# Patient Record
Sex: Female | Born: 1935 | Race: White | Hispanic: No | Smoking: Former smoker
Health system: Southern US, Community
[De-identification: ages and names within clinical notes are randomized; demographics above are authoritative.]

## PROBLEM LIST (undated history)

## (undated) DIAGNOSIS — K219 Gastro-esophageal reflux disease without esophagitis: Secondary | ICD-10-CM

## (undated) DIAGNOSIS — M8580 Other specified disorders of bone density and structure, unspecified site: Secondary | ICD-10-CM

## (undated) DIAGNOSIS — J449 Chronic obstructive pulmonary disease, unspecified: Secondary | ICD-10-CM

## (undated) DIAGNOSIS — F329 Major depressive disorder, single episode, unspecified: Secondary | ICD-10-CM

## (undated) DIAGNOSIS — D126 Benign neoplasm of colon, unspecified: Secondary | ICD-10-CM

## (undated) DIAGNOSIS — E039 Hypothyroidism, unspecified: Secondary | ICD-10-CM

## (undated) DIAGNOSIS — F419 Anxiety disorder, unspecified: Secondary | ICD-10-CM

## (undated) DIAGNOSIS — O2230 Deep phlebothrombosis in pregnancy, unspecified trimester: Secondary | ICD-10-CM

## (undated) DIAGNOSIS — E785 Hyperlipidemia, unspecified: Secondary | ICD-10-CM

## (undated) DIAGNOSIS — F32A Depression, unspecified: Secondary | ICD-10-CM

## (undated) DIAGNOSIS — M1990 Unspecified osteoarthritis, unspecified site: Secondary | ICD-10-CM

## (undated) DIAGNOSIS — Z9981 Dependence on supplemental oxygen: Secondary | ICD-10-CM

## (undated) DIAGNOSIS — I10 Essential (primary) hypertension: Secondary | ICD-10-CM

## (undated) DIAGNOSIS — K449 Diaphragmatic hernia without obstruction or gangrene: Secondary | ICD-10-CM

## (undated) DIAGNOSIS — I639 Cerebral infarction, unspecified: Secondary | ICD-10-CM

## (undated) DIAGNOSIS — I2699 Other pulmonary embolism without acute cor pulmonale: Secondary | ICD-10-CM

## (undated) DIAGNOSIS — K5790 Diverticulosis of intestine, part unspecified, without perforation or abscess without bleeding: Secondary | ICD-10-CM

## (undated) HISTORY — PX: APPENDECTOMY: SHX54

## (undated) HISTORY — DX: Hypothyroidism, unspecified: E03.9

## (undated) HISTORY — PX: CATARACT EXTRACTION: SUR2

## (undated) HISTORY — DX: Anxiety disorder, unspecified: F41.9

## (undated) HISTORY — DX: Essential (primary) hypertension: I10

## (undated) HISTORY — DX: Chronic obstructive pulmonary disease, unspecified: J44.9

## (undated) HISTORY — PX: LUMBAR FUSION: SHX111

## (undated) HISTORY — PX: CHOLECYSTECTOMY: SHX55

## (undated) HISTORY — DX: Other specified disorders of bone density and structure, unspecified site: M85.80

## (undated) HISTORY — PX: TUBAL LIGATION: SHX77

## (undated) HISTORY — DX: Diverticulosis of intestine, part unspecified, without perforation or abscess without bleeding: K57.90

## (undated) HISTORY — DX: Diaphragmatic hernia without obstruction or gangrene: K44.9

## (undated) HISTORY — PX: TONSILLECTOMY: SUR1361

## (undated) HISTORY — DX: Gastro-esophageal reflux disease without esophagitis: K21.9

## (undated) HISTORY — DX: Hyperlipidemia, unspecified: E78.5

## (undated) HISTORY — DX: Depression, unspecified: F32.A

## (undated) HISTORY — DX: Unspecified osteoarthritis, unspecified site: M19.90

## (undated) HISTORY — DX: Benign neoplasm of colon, unspecified: D12.6

## (undated) HISTORY — DX: Deep phlebothrombosis in pregnancy, unspecified trimester: O22.30

## (undated) HISTORY — DX: Major depressive disorder, single episode, unspecified: F32.9

## (undated) HISTORY — PX: POLYPECTOMY: SHX149

## (undated) HISTORY — DX: Other pulmonary embolism without acute cor pulmonale: I26.99

## (undated) HISTORY — DX: Cerebral infarction, unspecified: I63.9

---

## 2004-07-11 ENCOUNTER — Ambulatory Visit: Payer: Self-pay | Admitting: Internal Medicine

## 2004-07-11 ENCOUNTER — Inpatient Hospital Stay (HOSPITAL_COMMUNITY): Admission: EM | Admit: 2004-07-11 | Discharge: 2004-07-14 | Payer: Self-pay | Admitting: Emergency Medicine

## 2004-07-12 ENCOUNTER — Ambulatory Visit: Payer: Self-pay | Admitting: Internal Medicine

## 2004-07-16 ENCOUNTER — Ambulatory Visit: Payer: Self-pay | Admitting: Internal Medicine

## 2004-07-25 ENCOUNTER — Ambulatory Visit: Payer: Self-pay | Admitting: Internal Medicine

## 2004-08-08 ENCOUNTER — Ambulatory Visit: Payer: Self-pay | Admitting: Internal Medicine

## 2004-08-26 ENCOUNTER — Ambulatory Visit: Payer: Self-pay | Admitting: Internal Medicine

## 2004-09-03 ENCOUNTER — Ambulatory Visit: Payer: Self-pay

## 2004-09-26 ENCOUNTER — Ambulatory Visit: Payer: Self-pay | Admitting: Internal Medicine

## 2004-10-24 ENCOUNTER — Ambulatory Visit: Payer: Self-pay | Admitting: Internal Medicine

## 2004-10-24 ENCOUNTER — Other Ambulatory Visit: Admission: RE | Admit: 2004-10-24 | Discharge: 2004-10-24 | Payer: Self-pay | Admitting: Internal Medicine

## 2004-11-02 LAB — CONVERTED CEMR LAB: Pap Smear: NORMAL

## 2004-11-13 ENCOUNTER — Encounter: Admission: RE | Admit: 2004-11-13 | Discharge: 2004-11-13 | Payer: Self-pay | Admitting: Internal Medicine

## 2004-12-01 ENCOUNTER — Ambulatory Visit: Payer: Self-pay | Admitting: Internal Medicine

## 2004-12-23 ENCOUNTER — Ambulatory Visit: Payer: Self-pay | Admitting: Internal Medicine

## 2004-12-29 ENCOUNTER — Ambulatory Visit: Payer: Self-pay | Admitting: Internal Medicine

## 2005-01-06 ENCOUNTER — Ambulatory Visit: Payer: Self-pay | Admitting: Family Medicine

## 2005-01-13 ENCOUNTER — Ambulatory Visit: Payer: Self-pay | Admitting: Internal Medicine

## 2005-01-26 ENCOUNTER — Ambulatory Visit: Payer: Self-pay | Admitting: Internal Medicine

## 2005-02-16 ENCOUNTER — Ambulatory Visit: Payer: Self-pay | Admitting: Internal Medicine

## 2005-03-30 ENCOUNTER — Ambulatory Visit: Payer: Self-pay | Admitting: Internal Medicine

## 2005-04-23 ENCOUNTER — Ambulatory Visit: Payer: Self-pay | Admitting: Internal Medicine

## 2005-05-08 ENCOUNTER — Ambulatory Visit: Payer: Self-pay | Admitting: Internal Medicine

## 2005-05-18 ENCOUNTER — Ambulatory Visit: Payer: Self-pay | Admitting: Internal Medicine

## 2005-05-29 ENCOUNTER — Ambulatory Visit: Payer: Self-pay | Admitting: Internal Medicine

## 2005-06-05 ENCOUNTER — Ambulatory Visit: Payer: Self-pay | Admitting: Gastroenterology

## 2005-06-12 ENCOUNTER — Ambulatory Visit: Payer: Self-pay | Admitting: Internal Medicine

## 2005-06-26 ENCOUNTER — Ambulatory Visit: Payer: Self-pay | Admitting: Family Medicine

## 2005-07-08 ENCOUNTER — Encounter: Payer: Self-pay | Admitting: Internal Medicine

## 2005-07-08 ENCOUNTER — Encounter (INDEPENDENT_AMBULATORY_CARE_PROVIDER_SITE_OTHER): Payer: Self-pay | Admitting: Specialist

## 2005-07-08 ENCOUNTER — Ambulatory Visit: Payer: Self-pay | Admitting: Gastroenterology

## 2005-07-08 DIAGNOSIS — D126 Benign neoplasm of colon, unspecified: Secondary | ICD-10-CM

## 2005-07-08 HISTORY — DX: Benign neoplasm of colon, unspecified: D12.6

## 2005-07-08 LAB — HM COLONOSCOPY

## 2005-07-10 ENCOUNTER — Ambulatory Visit: Payer: Self-pay | Admitting: Family Medicine

## 2005-07-24 ENCOUNTER — Ambulatory Visit: Payer: Self-pay | Admitting: Internal Medicine

## 2005-07-31 ENCOUNTER — Ambulatory Visit: Payer: Self-pay | Admitting: Internal Medicine

## 2005-08-14 ENCOUNTER — Ambulatory Visit: Payer: Self-pay | Admitting: Internal Medicine

## 2005-08-19 ENCOUNTER — Ambulatory Visit: Payer: Self-pay | Admitting: Internal Medicine

## 2005-08-27 ENCOUNTER — Ambulatory Visit: Payer: Self-pay | Admitting: Internal Medicine

## 2005-09-02 ENCOUNTER — Ambulatory Visit: Payer: Self-pay | Admitting: Internal Medicine

## 2005-09-11 ENCOUNTER — Ambulatory Visit: Payer: Self-pay | Admitting: Internal Medicine

## 2005-09-17 ENCOUNTER — Ambulatory Visit: Payer: Self-pay | Admitting: Internal Medicine

## 2005-10-02 ENCOUNTER — Ambulatory Visit: Payer: Self-pay | Admitting: Internal Medicine

## 2005-10-09 ENCOUNTER — Ambulatory Visit: Payer: Self-pay | Admitting: Internal Medicine

## 2005-10-23 ENCOUNTER — Ambulatory Visit: Payer: Self-pay | Admitting: Internal Medicine

## 2005-11-20 ENCOUNTER — Ambulatory Visit: Payer: Self-pay | Admitting: Internal Medicine

## 2005-12-08 ENCOUNTER — Ambulatory Visit: Payer: Self-pay | Admitting: Internal Medicine

## 2005-12-21 ENCOUNTER — Ambulatory Visit: Payer: Self-pay | Admitting: Internal Medicine

## 2005-12-25 ENCOUNTER — Ambulatory Visit: Payer: Self-pay | Admitting: Internal Medicine

## 2006-01-08 ENCOUNTER — Ambulatory Visit: Payer: Self-pay | Admitting: Internal Medicine

## 2006-02-02 ENCOUNTER — Ambulatory Visit: Payer: Self-pay | Admitting: Internal Medicine

## 2006-03-05 ENCOUNTER — Ambulatory Visit: Payer: Self-pay | Admitting: Internal Medicine

## 2006-03-18 ENCOUNTER — Ambulatory Visit: Payer: Self-pay | Admitting: Internal Medicine

## 2006-03-30 ENCOUNTER — Emergency Department (HOSPITAL_COMMUNITY): Admission: EM | Admit: 2006-03-30 | Discharge: 2006-03-30 | Payer: Self-pay | Admitting: Emergency Medicine

## 2006-04-02 ENCOUNTER — Ambulatory Visit: Payer: Self-pay | Admitting: Internal Medicine

## 2006-05-11 ENCOUNTER — Ambulatory Visit: Payer: Self-pay | Admitting: Internal Medicine

## 2006-06-08 ENCOUNTER — Ambulatory Visit: Payer: Self-pay | Admitting: Internal Medicine

## 2006-06-15 ENCOUNTER — Ambulatory Visit: Payer: Self-pay | Admitting: Internal Medicine

## 2006-06-21 ENCOUNTER — Ambulatory Visit: Payer: Self-pay | Admitting: Internal Medicine

## 2006-06-28 ENCOUNTER — Ambulatory Visit: Payer: Self-pay | Admitting: Internal Medicine

## 2006-07-13 ENCOUNTER — Ambulatory Visit: Payer: Self-pay | Admitting: Internal Medicine

## 2006-07-23 ENCOUNTER — Ambulatory Visit: Payer: Self-pay | Admitting: Internal Medicine

## 2006-08-18 ENCOUNTER — Ambulatory Visit: Payer: Self-pay | Admitting: Internal Medicine

## 2006-09-11 DIAGNOSIS — J449 Chronic obstructive pulmonary disease, unspecified: Secondary | ICD-10-CM | POA: Insufficient documentation

## 2006-09-11 DIAGNOSIS — F32A Depression, unspecified: Secondary | ICD-10-CM | POA: Insufficient documentation

## 2006-09-11 DIAGNOSIS — I10 Essential (primary) hypertension: Secondary | ICD-10-CM | POA: Insufficient documentation

## 2006-09-11 DIAGNOSIS — F329 Major depressive disorder, single episode, unspecified: Secondary | ICD-10-CM | POA: Insufficient documentation

## 2006-09-11 DIAGNOSIS — K219 Gastro-esophageal reflux disease without esophagitis: Secondary | ICD-10-CM | POA: Insufficient documentation

## 2006-09-11 DIAGNOSIS — J45909 Unspecified asthma, uncomplicated: Secondary | ICD-10-CM | POA: Insufficient documentation

## 2006-09-11 DIAGNOSIS — Z95818 Presence of other cardiac implants and grafts: Secondary | ICD-10-CM | POA: Insufficient documentation

## 2006-09-11 DIAGNOSIS — N390 Urinary tract infection, site not specified: Secondary | ICD-10-CM | POA: Insufficient documentation

## 2006-09-20 ENCOUNTER — Ambulatory Visit: Payer: Self-pay | Admitting: Internal Medicine

## 2006-09-20 LAB — CONVERTED CEMR LAB
BUN: 19 mg/dL (ref 6–23)
Creatinine, Ser: 1.3 mg/dL — ABNORMAL HIGH (ref 0.4–1.2)
HCT: 41.1 % (ref 36.0–46.0)
Hemoglobin: 13.8 g/dL (ref 12.0–15.0)
Hgb A1c MFr Bld: 6 % (ref 4.6–6.0)
Homocysteine: 13.2 micromoles/L (ref 5.00–13.90)
MCHC: 33.6 g/dL (ref 30.0–36.0)
MCV: 92.4 fL (ref 78.0–100.0)
Platelets: 200 10*3/uL (ref 150–400)
Potassium: 4.7 meq/L (ref 3.5–5.1)
RBC: 4.45 M/uL (ref 3.87–5.11)
RDW: 13.9 % (ref 11.5–14.6)
WBC: 7 10*3/uL (ref 4.5–10.5)

## 2006-09-21 ENCOUNTER — Encounter: Payer: Self-pay | Admitting: Internal Medicine

## 2006-09-21 LAB — CONVERTED CEMR LAB
Bilirubin Urine: NEGATIVE
Ketones, ur: NEGATIVE mg/dL
Leukocytes, UA: NEGATIVE
Nitrite: NEGATIVE
Protein, ur: NEGATIVE mg/dL
Specific Gravity, Urine: 1.018 (ref 1.005–1.03)
Urine Glucose: NEGATIVE mg/dL
Urobilinogen, UA: 0.2 (ref 0.0–1.0)
WBC, UA: NONE SEEN cells/hpf (ref <3)
pH: 6 (ref 5.0–8.0)

## 2006-09-22 ENCOUNTER — Encounter: Payer: Self-pay | Admitting: Internal Medicine

## 2006-09-29 ENCOUNTER — Ambulatory Visit: Payer: Self-pay | Admitting: Internal Medicine

## 2006-10-20 ENCOUNTER — Ambulatory Visit: Payer: Self-pay | Admitting: Internal Medicine

## 2006-11-17 ENCOUNTER — Ambulatory Visit: Payer: Self-pay | Admitting: Internal Medicine

## 2006-12-15 ENCOUNTER — Ambulatory Visit: Payer: Self-pay | Admitting: Internal Medicine

## 2006-12-15 LAB — CONVERTED CEMR LAB
INR: 1.7
Prothrombin Time: 16.1 s

## 2006-12-29 ENCOUNTER — Ambulatory Visit: Payer: Self-pay | Admitting: Internal Medicine

## 2007-01-10 ENCOUNTER — Ambulatory Visit: Payer: Self-pay | Admitting: Internal Medicine

## 2007-01-10 LAB — CONVERTED CEMR LAB
INR: 3.6
Prothrombin Time: 23 s

## 2007-01-24 ENCOUNTER — Ambulatory Visit: Payer: Self-pay | Admitting: Internal Medicine

## 2007-01-27 LAB — CONVERTED CEMR LAB
INR: 1.8 (ref 0.9–2.0)
Prothrombin Time: 16.8 s — ABNORMAL HIGH (ref 10.0–14.0)

## 2007-02-07 ENCOUNTER — Ambulatory Visit: Payer: Self-pay | Admitting: Internal Medicine

## 2007-02-07 LAB — CONVERTED CEMR LAB: INR: 2.3

## 2007-03-09 ENCOUNTER — Ambulatory Visit: Payer: Self-pay | Admitting: Internal Medicine

## 2007-03-09 LAB — CONVERTED CEMR LAB: INR: 3.3

## 2007-03-22 ENCOUNTER — Encounter: Payer: Self-pay | Admitting: Internal Medicine

## 2007-03-24 ENCOUNTER — Ambulatory Visit: Payer: Self-pay | Admitting: Internal Medicine

## 2007-03-24 ENCOUNTER — Encounter: Payer: Self-pay | Admitting: Cardiology

## 2007-03-24 LAB — CONVERTED CEMR LAB: INR: 2.6

## 2007-03-28 ENCOUNTER — Encounter (INDEPENDENT_AMBULATORY_CARE_PROVIDER_SITE_OTHER): Payer: Self-pay | Admitting: *Deleted

## 2007-03-29 LAB — CONVERTED CEMR LAB
BUN: 26 mg/dL — ABNORMAL HIGH (ref 6–23)
Basophils Absolute: 0.1 10*3/uL (ref 0.0–0.1)
Basophils Relative: 1 % (ref 0.0–1.0)
CO2: 32 meq/L (ref 19–32)
Calcium: 9 mg/dL (ref 8.4–10.5)
Chloride: 107 meq/L (ref 96–112)
Creatinine, Ser: 1.3 mg/dL — ABNORMAL HIGH (ref 0.4–1.2)
Eosinophils Absolute: 0.3 10*3/uL (ref 0.0–0.6)
Eosinophils Relative: 4.7 % (ref 0.0–5.0)
GFR calc Af Amer: 52 mL/min
GFR calc non Af Amer: 43 mL/min
Glucose, Bld: 96 mg/dL (ref 70–99)
HCT: 34.8 % — ABNORMAL LOW (ref 36.0–46.0)
Hemoglobin: 12 g/dL (ref 12.0–15.0)
Hgb A1c MFr Bld: 6.2 % — ABNORMAL HIGH (ref 4.6–6.0)
Lymphocytes Relative: 23 % (ref 12.0–46.0)
MCHC: 34.5 g/dL (ref 30.0–36.0)
MCV: 87.9 fL (ref 78.0–100.0)
Monocytes Absolute: 0.5 10*3/uL (ref 0.2–0.7)
Monocytes Relative: 6.4 % (ref 3.0–11.0)
Neutro Abs: 4.6 10*3/uL (ref 1.4–7.7)
Neutrophils Relative %: 64.9 % (ref 43.0–77.0)
Platelets: 237 10*3/uL (ref 150–400)
Potassium: 4.5 meq/L (ref 3.5–5.1)
RBC: 3.96 M/uL (ref 3.87–5.11)
RDW: 14.6 % (ref 11.5–14.6)
Sodium: 145 meq/L (ref 135–145)
WBC: 7.1 10*3/uL (ref 4.5–10.5)

## 2007-04-08 ENCOUNTER — Ambulatory Visit: Payer: Self-pay | Admitting: Internal Medicine

## 2007-04-08 LAB — CONVERTED CEMR LAB
Hemoglobin: 12.9 g/dL
INR: 2.5

## 2007-04-13 ENCOUNTER — Ambulatory Visit: Payer: Self-pay | Admitting: Cardiology

## 2007-04-25 ENCOUNTER — Ambulatory Visit: Payer: Self-pay

## 2007-04-25 ENCOUNTER — Encounter: Payer: Self-pay | Admitting: Cardiology

## 2007-04-25 ENCOUNTER — Encounter: Payer: Self-pay | Admitting: Internal Medicine

## 2007-05-05 ENCOUNTER — Ambulatory Visit: Payer: Self-pay | Admitting: Internal Medicine

## 2007-05-05 LAB — CONVERTED CEMR LAB: INR: 3.3

## 2007-05-13 ENCOUNTER — Ambulatory Visit: Payer: Self-pay | Admitting: Cardiology

## 2007-05-31 ENCOUNTER — Ambulatory Visit: Payer: Self-pay | Admitting: Internal Medicine

## 2007-05-31 LAB — CONVERTED CEMR LAB: INR: 3

## 2007-06-21 ENCOUNTER — Encounter (INDEPENDENT_AMBULATORY_CARE_PROVIDER_SITE_OTHER): Payer: Self-pay | Admitting: *Deleted

## 2007-06-29 ENCOUNTER — Ambulatory Visit: Payer: Self-pay | Admitting: Internal Medicine

## 2007-06-29 LAB — CONVERTED CEMR LAB: INR: 2

## 2007-07-02 ENCOUNTER — Encounter (INDEPENDENT_AMBULATORY_CARE_PROVIDER_SITE_OTHER): Payer: Self-pay | Admitting: *Deleted

## 2007-07-02 LAB — CONVERTED CEMR LAB: TSH: 0.96 microintl units/mL (ref 0.35–5.50)

## 2007-07-22 ENCOUNTER — Ambulatory Visit: Payer: Self-pay | Admitting: Internal Medicine

## 2007-07-22 DIAGNOSIS — M810 Age-related osteoporosis without current pathological fracture: Secondary | ICD-10-CM | POA: Insufficient documentation

## 2007-07-22 LAB — CONVERTED CEMR LAB: INR: 2.5

## 2007-08-16 ENCOUNTER — Ambulatory Visit: Payer: Self-pay | Admitting: Internal Medicine

## 2007-08-16 LAB — CONVERTED CEMR LAB: INR: 2.1

## 2007-09-13 ENCOUNTER — Ambulatory Visit: Payer: Self-pay | Admitting: Internal Medicine

## 2007-09-13 ENCOUNTER — Encounter: Payer: Self-pay | Admitting: Internal Medicine

## 2007-09-13 ENCOUNTER — Other Ambulatory Visit: Admission: RE | Admit: 2007-09-13 | Discharge: 2007-09-13 | Payer: Self-pay | Admitting: Internal Medicine

## 2007-09-13 DIAGNOSIS — E039 Hypothyroidism, unspecified: Secondary | ICD-10-CM | POA: Insufficient documentation

## 2007-09-13 DIAGNOSIS — E785 Hyperlipidemia, unspecified: Secondary | ICD-10-CM | POA: Insufficient documentation

## 2007-09-13 LAB — CONVERTED CEMR LAB
INR: 1.8
Pap Smear: NORMAL

## 2007-09-14 ENCOUNTER — Telehealth (INDEPENDENT_AMBULATORY_CARE_PROVIDER_SITE_OTHER): Payer: Self-pay | Admitting: *Deleted

## 2007-09-15 ENCOUNTER — Telehealth (INDEPENDENT_AMBULATORY_CARE_PROVIDER_SITE_OTHER): Payer: Self-pay | Admitting: *Deleted

## 2007-09-15 DIAGNOSIS — M25559 Pain in unspecified hip: Secondary | ICD-10-CM | POA: Insufficient documentation

## 2007-09-19 ENCOUNTER — Ambulatory Visit: Payer: Self-pay | Admitting: Internal Medicine

## 2007-09-19 LAB — CONVERTED CEMR LAB
ALT: 33 units/L (ref 0–35)
AST: 29 units/L (ref 0–37)
Albumin: 3.6 g/dL (ref 3.5–5.2)
Alkaline Phosphatase: 75 units/L (ref 39–117)
BUN: 20 mg/dL (ref 6–23)
Bilirubin, Direct: 0.1 mg/dL (ref 0.0–0.3)
CO2: 32 meq/L (ref 19–32)
Calcium: 9.4 mg/dL (ref 8.4–10.5)
Chloride: 102 meq/L (ref 96–112)
Cholesterol: 185 mg/dL (ref 0–200)
Creatinine, Ser: 1.2 mg/dL (ref 0.4–1.2)
GFR calc Af Amer: 57 mL/min
GFR calc non Af Amer: 47 mL/min
Glucose, Bld: 91 mg/dL (ref 70–99)
HDL: 55.4 mg/dL (ref >39.0)
Hgb A1c MFr Bld: 5.9 % (ref 4.6–6.0)
LDL Cholesterol: 109 mg/dL — ABNORMAL HIGH (ref 0–99)
Potassium: 4.3 meq/L (ref 3.5–5.1)
Sodium: 140 meq/L (ref 135–145)
Total Bilirubin: 1.2 mg/dL (ref 0.3–1.2)
Total CHOL/HDL Ratio: 3.3
Total Protein: 6.9 g/dL (ref 6.0–8.3)
Triglycerides: 102 mg/dL (ref 0–149)
VLDL: 20 mg/dL (ref 0–40)

## 2007-09-22 ENCOUNTER — Encounter (INDEPENDENT_AMBULATORY_CARE_PROVIDER_SITE_OTHER): Payer: Self-pay | Admitting: *Deleted

## 2007-09-29 ENCOUNTER — Encounter: Admission: RE | Admit: 2007-09-29 | Discharge: 2007-09-29 | Payer: Self-pay | Admitting: Internal Medicine

## 2007-10-05 ENCOUNTER — Ambulatory Visit: Payer: Self-pay | Admitting: Internal Medicine

## 2007-10-05 LAB — CONVERTED CEMR LAB: INR: 3.9

## 2007-10-10 ENCOUNTER — Encounter (INDEPENDENT_AMBULATORY_CARE_PROVIDER_SITE_OTHER): Payer: Self-pay | Admitting: *Deleted

## 2007-10-18 ENCOUNTER — Ambulatory Visit: Payer: Self-pay | Admitting: Internal Medicine

## 2007-10-18 LAB — CONVERTED CEMR LAB: INR: 3.9

## 2007-11-01 ENCOUNTER — Ambulatory Visit: Payer: Self-pay | Admitting: Internal Medicine

## 2007-11-01 LAB — CONVERTED CEMR LAB: INR: 4.4

## 2007-11-22 ENCOUNTER — Ambulatory Visit: Payer: Self-pay | Admitting: Internal Medicine

## 2007-11-22 LAB — CONVERTED CEMR LAB: INR: 2.2

## 2007-12-19 ENCOUNTER — Ambulatory Visit: Payer: Self-pay | Admitting: Internal Medicine

## 2007-12-19 LAB — CONVERTED CEMR LAB: INR: 1.2

## 2008-01-02 ENCOUNTER — Ambulatory Visit: Payer: Self-pay | Admitting: Internal Medicine

## 2008-01-02 LAB — CONVERTED CEMR LAB: INR: 1.9

## 2008-01-10 ENCOUNTER — Ambulatory Visit: Payer: Self-pay | Admitting: Internal Medicine

## 2008-01-10 LAB — CONVERTED CEMR LAB
INR: 1.5
Prothrombin Time: 15.1 s

## 2008-01-23 ENCOUNTER — Ambulatory Visit: Payer: Self-pay | Admitting: Internal Medicine

## 2008-01-23 LAB — CONVERTED CEMR LAB
INR: 5.4
Prothrombin Time: 28.1 s

## 2008-02-13 ENCOUNTER — Telehealth (INDEPENDENT_AMBULATORY_CARE_PROVIDER_SITE_OTHER): Payer: Self-pay | Admitting: *Deleted

## 2008-02-21 ENCOUNTER — Ambulatory Visit: Payer: Self-pay | Admitting: Internal Medicine

## 2008-02-21 LAB — CONVERTED CEMR LAB
INR: 2.3
Prothrombin Time: 18.6 s

## 2008-03-22 ENCOUNTER — Ambulatory Visit: Payer: Self-pay | Admitting: Internal Medicine

## 2008-03-22 LAB — CONVERTED CEMR LAB
INR: 2.1
Prothrombin Time: 17.7 s

## 2008-04-19 ENCOUNTER — Ambulatory Visit: Payer: Self-pay | Admitting: Internal Medicine

## 2008-04-19 LAB — CONVERTED CEMR LAB
INR: 1.5
Prothrombin Time: 15.1 s

## 2008-05-02 ENCOUNTER — Ambulatory Visit: Payer: Self-pay | Admitting: Internal Medicine

## 2008-05-02 LAB — CONVERTED CEMR LAB
INR: 13.8
Prothrombin Time: 1.2 s

## 2008-05-16 ENCOUNTER — Ambulatory Visit: Payer: Self-pay | Admitting: Internal Medicine

## 2008-05-16 DIAGNOSIS — M549 Dorsalgia, unspecified: Secondary | ICD-10-CM | POA: Insufficient documentation

## 2008-05-16 LAB — CONVERTED CEMR LAB
INR: 2.4
Prothrombin Time: 18.8 s

## 2008-05-17 ENCOUNTER — Encounter (INDEPENDENT_AMBULATORY_CARE_PROVIDER_SITE_OTHER): Payer: Self-pay | Admitting: *Deleted

## 2008-05-21 ENCOUNTER — Encounter: Admission: RE | Admit: 2008-05-21 | Discharge: 2008-05-21 | Payer: Self-pay | Admitting: Internal Medicine

## 2008-05-24 ENCOUNTER — Telehealth: Payer: Self-pay | Admitting: Internal Medicine

## 2008-05-30 ENCOUNTER — Ambulatory Visit: Payer: Self-pay | Admitting: Internal Medicine

## 2008-05-30 LAB — CONVERTED CEMR LAB
INR: 2.5
Prothrombin Time: 19.3 s

## 2008-06-04 ENCOUNTER — Encounter: Payer: Self-pay | Admitting: Internal Medicine

## 2008-06-28 ENCOUNTER — Encounter
Admission: RE | Admit: 2008-06-28 | Discharge: 2008-09-26 | Payer: Self-pay | Admitting: Physical Medicine & Rehabilitation

## 2008-06-29 ENCOUNTER — Ambulatory Visit: Payer: Self-pay | Admitting: Physical Medicine & Rehabilitation

## 2008-07-02 ENCOUNTER — Ambulatory Visit: Payer: Self-pay | Admitting: Internal Medicine

## 2008-07-02 LAB — CONVERTED CEMR LAB
INR: 4.3
Prothrombin Time: 25 s

## 2008-07-03 ENCOUNTER — Encounter
Admission: RE | Admit: 2008-07-03 | Discharge: 2008-09-06 | Payer: Self-pay | Admitting: Physical Medicine & Rehabilitation

## 2008-07-11 ENCOUNTER — Ambulatory Visit: Payer: Self-pay | Admitting: Internal Medicine

## 2008-07-11 LAB — CONVERTED CEMR LAB
INR: 0.9
Prothrombin Time: 11.9 s

## 2008-07-12 ENCOUNTER — Ambulatory Visit: Payer: Self-pay | Admitting: Physical Medicine & Rehabilitation

## 2008-07-24 ENCOUNTER — Ambulatory Visit: Payer: Self-pay | Admitting: Internal Medicine

## 2008-07-24 LAB — CONVERTED CEMR LAB
INR: 1.4
Prothrombin Time: 14.6 s

## 2008-08-07 ENCOUNTER — Ambulatory Visit: Payer: Self-pay | Admitting: Internal Medicine

## 2008-08-07 LAB — CONVERTED CEMR LAB
INR: 8
INR: 8.5 (ref 0.8–1.0)
Prothrombin Time: 33.9 s
Prothrombin Time: 81.3 s (ref 10.9–13.3)

## 2008-08-08 ENCOUNTER — Telehealth (INDEPENDENT_AMBULATORY_CARE_PROVIDER_SITE_OTHER): Payer: Self-pay | Admitting: *Deleted

## 2008-08-10 ENCOUNTER — Encounter (INDEPENDENT_AMBULATORY_CARE_PROVIDER_SITE_OTHER): Payer: Self-pay | Admitting: *Deleted

## 2008-08-13 ENCOUNTER — Ambulatory Visit: Payer: Self-pay | Admitting: Physical Medicine & Rehabilitation

## 2008-08-27 ENCOUNTER — Ambulatory Visit: Payer: Self-pay | Admitting: Cardiology

## 2008-09-05 ENCOUNTER — Ambulatory Visit: Payer: Self-pay | Admitting: Internal Medicine

## 2008-09-12 ENCOUNTER — Ambulatory Visit: Payer: Self-pay | Admitting: Cardiology

## 2008-09-12 ENCOUNTER — Telehealth (INDEPENDENT_AMBULATORY_CARE_PROVIDER_SITE_OTHER): Payer: Self-pay | Admitting: *Deleted

## 2008-09-21 ENCOUNTER — Ambulatory Visit: Payer: Self-pay | Admitting: Cardiology

## 2008-10-01 ENCOUNTER — Ambulatory Visit: Payer: Self-pay | Admitting: Cardiology

## 2008-10-29 ENCOUNTER — Ambulatory Visit: Payer: Self-pay | Admitting: Cardiology

## 2008-11-20 ENCOUNTER — Ambulatory Visit: Payer: Self-pay | Admitting: Internal Medicine

## 2008-11-22 LAB — CONVERTED CEMR LAB: TSH: 0.92 microintl units/mL (ref 0.35–5.50)

## 2008-11-26 ENCOUNTER — Ambulatory Visit: Payer: Self-pay | Admitting: Cardiology

## 2008-11-28 ENCOUNTER — Ambulatory Visit: Payer: Self-pay | Admitting: Internal Medicine

## 2008-11-28 DIAGNOSIS — L719 Rosacea, unspecified: Secondary | ICD-10-CM | POA: Insufficient documentation

## 2008-11-30 ENCOUNTER — Encounter: Payer: Self-pay | Admitting: Internal Medicine

## 2008-12-05 LAB — CONVERTED CEMR LAB
ALT: 19 units/L (ref 0–35)
AST: 23 units/L (ref 0–37)
BUN: 22 mg/dL (ref 6–23)
Basophils Absolute: 0.1 10*3/uL (ref 0.0–0.1)
Basophils Relative: 1.4 % (ref 0.0–3.0)
CO2: 28 meq/L (ref 19–32)
Calcium: 9 mg/dL (ref 8.4–10.5)
Chloride: 109 meq/L (ref 96–112)
Creatinine, Ser: 1.2 mg/dL (ref 0.4–1.2)
Eosinophils Absolute: 0.2 10*3/uL (ref 0.0–0.7)
Eosinophils Relative: 2.4 % (ref 0.0–5.0)
GFR calc non Af Amer: 47.11 mL/min (ref >60)
Glucose, Bld: 89 mg/dL (ref 70–99)
HCT: 39.2 % (ref 36.0–46.0)
Hemoglobin: 13.5 g/dL (ref 12.0–15.0)
Lymphocytes Relative: 20.7 % (ref 12.0–46.0)
Lymphs Abs: 1.6 10*3/uL (ref 0.7–4.0)
MCHC: 34.5 g/dL (ref 30.0–36.0)
MCV: 85.6 fL (ref 78.0–100.0)
Monocytes Absolute: 0.6 10*3/uL (ref 0.1–1.0)
Monocytes Relative: 7.2 % (ref 3.0–12.0)
Neutro Abs: 5.4 10*3/uL (ref 1.4–7.7)
Neutrophils Relative %: 68.3 % (ref 43.0–77.0)
Platelets: 367 10*3/uL (ref 150.0–400.0)
Potassium: 4.4 meq/L (ref 3.5–5.1)
RBC: 4.58 M/uL (ref 3.87–5.11)
RDW: 13.2 % (ref 11.5–14.6)
Sodium: 144 meq/L (ref 135–145)
WBC: 7.9 10*3/uL (ref 4.5–10.5)

## 2008-12-06 ENCOUNTER — Encounter (INDEPENDENT_AMBULATORY_CARE_PROVIDER_SITE_OTHER): Payer: Self-pay | Admitting: *Deleted

## 2008-12-24 ENCOUNTER — Ambulatory Visit: Payer: Self-pay | Admitting: Cardiovascular Disease

## 2008-12-28 ENCOUNTER — Ambulatory Visit: Payer: Self-pay | Admitting: Cardiology

## 2009-01-16 ENCOUNTER — Telehealth: Payer: Self-pay | Admitting: Internal Medicine

## 2009-01-18 ENCOUNTER — Ambulatory Visit: Payer: Self-pay | Admitting: Cardiology

## 2009-01-21 ENCOUNTER — Telehealth (INDEPENDENT_AMBULATORY_CARE_PROVIDER_SITE_OTHER): Payer: Self-pay | Admitting: Cardiology

## 2009-01-21 ENCOUNTER — Encounter (INDEPENDENT_AMBULATORY_CARE_PROVIDER_SITE_OTHER): Payer: Self-pay | Admitting: *Deleted

## 2009-02-05 ENCOUNTER — Encounter: Payer: Self-pay | Admitting: *Deleted

## 2009-02-07 ENCOUNTER — Ambulatory Visit: Payer: Self-pay | Admitting: Internal Medicine

## 2009-02-07 LAB — CONVERTED CEMR LAB
POC INR: 2.3
Protime: 18.7

## 2009-02-27 ENCOUNTER — Ambulatory Visit: Payer: Self-pay | Admitting: Internal Medicine

## 2009-03-06 ENCOUNTER — Ambulatory Visit: Payer: Self-pay | Admitting: Internal Medicine

## 2009-03-07 ENCOUNTER — Encounter: Payer: Self-pay | Admitting: Internal Medicine

## 2009-03-12 ENCOUNTER — Encounter: Admission: RE | Admit: 2009-03-12 | Discharge: 2009-03-12 | Payer: Self-pay | Admitting: Internal Medicine

## 2009-03-12 ENCOUNTER — Telehealth (INDEPENDENT_AMBULATORY_CARE_PROVIDER_SITE_OTHER): Payer: Self-pay | Admitting: *Deleted

## 2009-03-12 LAB — CONVERTED CEMR LAB
Cholesterol: 229 mg/dL — ABNORMAL HIGH (ref 0–200)
Direct LDL: 178.9 mg/dL
HDL: 40.3 mg/dL (ref >39.00)
Hgb A1c MFr Bld: 6.1 % (ref 4.6–6.5)
RBC / HPF: NONE SEEN (ref <3)
Total CHOL/HDL Ratio: 6
Triglycerides: 110 mg/dL (ref 0.0–149.0)
VLDL: 22 mg/dL (ref 0.0–40.0)
Vit D, 25-Hydroxy: 21 ng/mL — ABNORMAL LOW (ref 30–89)

## 2009-03-12 LAB — HM MAMMOGRAPHY: HM Mammogram: NEGATIVE

## 2009-03-13 ENCOUNTER — Encounter: Payer: Self-pay | Admitting: *Deleted

## 2009-03-15 ENCOUNTER — Ambulatory Visit: Payer: Self-pay | Admitting: Cardiology

## 2009-03-15 LAB — CONVERTED CEMR LAB: POC INR: 2.4

## 2009-04-18 ENCOUNTER — Ambulatory Visit: Payer: Self-pay | Admitting: Internal Medicine

## 2009-04-18 LAB — CONVERTED CEMR LAB: POC INR: 3.7

## 2009-05-09 ENCOUNTER — Ambulatory Visit: Payer: Self-pay | Admitting: Cardiology

## 2009-05-09 LAB — CONVERTED CEMR LAB: POC INR: 2.9

## 2009-05-21 ENCOUNTER — Telehealth (INDEPENDENT_AMBULATORY_CARE_PROVIDER_SITE_OTHER): Payer: Self-pay | Admitting: *Deleted

## 2009-06-04 ENCOUNTER — Ambulatory Visit: Payer: Self-pay | Admitting: Internal Medicine

## 2009-06-04 DIAGNOSIS — E119 Type 2 diabetes mellitus without complications: Secondary | ICD-10-CM | POA: Insufficient documentation

## 2009-06-06 ENCOUNTER — Ambulatory Visit: Payer: Self-pay | Admitting: Internal Medicine

## 2009-06-06 LAB — CONVERTED CEMR LAB: POC INR: 3.2

## 2009-06-19 ENCOUNTER — Telehealth (INDEPENDENT_AMBULATORY_CARE_PROVIDER_SITE_OTHER): Payer: Self-pay | Admitting: *Deleted

## 2009-07-01 ENCOUNTER — Ambulatory Visit: Payer: Self-pay | Admitting: Cardiovascular Disease

## 2009-07-01 LAB — CONVERTED CEMR LAB: POC INR: 3.4

## 2009-07-09 ENCOUNTER — Ambulatory Visit: Payer: Self-pay | Admitting: Internal Medicine

## 2009-07-09 LAB — CONVERTED CEMR LAB
Vit D, 25-Hydroxy: 34 ng/mL (ref 30–89)
Vit D, 25-Hydroxy: 36 ng/mL (ref 30–89)

## 2009-07-11 LAB — CONVERTED CEMR LAB
ALT: 19 units/L (ref 0–35)
AST: 21 units/L (ref 0–37)
Cholesterol: 167 mg/dL (ref 0–200)
HDL: 50.6 mg/dL (ref >39.00)
Hgb A1c MFr Bld: 6.4 % (ref 4.6–6.5)
LDL Cholesterol: 98 mg/dL (ref 0–99)
Total CHOL/HDL Ratio: 3
Triglycerides: 90 mg/dL (ref 0.0–149.0)
VLDL: 18 mg/dL (ref 0.0–40.0)

## 2009-07-15 LAB — CONVERTED CEMR LAB: TSH: 0.39 microintl units/mL (ref 0.35–5.50)

## 2009-07-29 ENCOUNTER — Ambulatory Visit: Payer: Self-pay | Admitting: Internal Medicine

## 2009-07-29 LAB — CONVERTED CEMR LAB: POC INR: 2.5

## 2009-08-26 ENCOUNTER — Ambulatory Visit: Payer: Self-pay | Admitting: Cardiology

## 2009-08-26 LAB — CONVERTED CEMR LAB: POC INR: 3.7

## 2009-09-19 ENCOUNTER — Ambulatory Visit: Payer: Self-pay | Admitting: Internal Medicine

## 2009-09-19 LAB — CONVERTED CEMR LAB: POC INR: 3

## 2009-10-04 ENCOUNTER — Ambulatory Visit: Payer: Self-pay | Admitting: Internal Medicine

## 2009-10-07 LAB — CONVERTED CEMR LAB
BUN: 18 mg/dL (ref 6–23)
CO2: 31 meq/L (ref 19–32)
Calcium: 8.8 mg/dL (ref 8.4–10.5)
Chloride: 108 meq/L (ref 96–112)
Creatinine, Ser: 1.2 mg/dL (ref 0.4–1.2)
Creatinine,U: 165.7 mg/dL
GFR calc non Af Amer: 46.99 mL/min (ref >60)
Glucose, Bld: 93 mg/dL (ref 70–99)
Microalb Creat Ratio: 18.1 mg/g (ref 0.0–30.0)
Microalb, Ur: 3 mg/dL — ABNORMAL HIGH (ref 0.0–1.9)
Potassium: 3.9 meq/L (ref 3.5–5.1)
Sodium: 145 meq/L (ref 135–145)

## 2009-10-14 ENCOUNTER — Encounter: Admission: RE | Admit: 2009-10-14 | Discharge: 2009-10-14 | Payer: Self-pay | Admitting: Internal Medicine

## 2009-10-14 ENCOUNTER — Encounter: Payer: Self-pay | Admitting: Internal Medicine

## 2009-10-17 ENCOUNTER — Ambulatory Visit: Payer: Self-pay | Admitting: Cardiology

## 2009-10-17 LAB — CONVERTED CEMR LAB: POC INR: 1.9

## 2009-10-21 ENCOUNTER — Telehealth (INDEPENDENT_AMBULATORY_CARE_PROVIDER_SITE_OTHER): Payer: Self-pay | Admitting: *Deleted

## 2009-10-21 ENCOUNTER — Encounter: Payer: Self-pay | Admitting: Internal Medicine

## 2009-11-04 ENCOUNTER — Telehealth (INDEPENDENT_AMBULATORY_CARE_PROVIDER_SITE_OTHER): Payer: Self-pay | Admitting: *Deleted

## 2009-11-26 ENCOUNTER — Ambulatory Visit: Payer: Self-pay | Admitting: Cardiovascular Disease

## 2009-11-26 LAB — CONVERTED CEMR LAB: POC INR: 1.4

## 2009-12-05 ENCOUNTER — Encounter: Payer: Self-pay | Admitting: Internal Medicine

## 2009-12-05 LAB — HM DIABETES EYE EXAM: HM Diabetic Eye Exam: NORMAL

## 2009-12-16 ENCOUNTER — Ambulatory Visit: Payer: Self-pay | Admitting: Cardiovascular Disease

## 2009-12-16 LAB — CONVERTED CEMR LAB: POC INR: 2.5

## 2010-01-02 ENCOUNTER — Ambulatory Visit: Payer: Self-pay | Admitting: Family Medicine

## 2010-01-07 ENCOUNTER — Ambulatory Visit: Payer: Self-pay | Admitting: Cardiology

## 2010-01-07 LAB — CONVERTED CEMR LAB: POC INR: 2.3

## 2010-01-27 ENCOUNTER — Ambulatory Visit: Payer: Self-pay | Admitting: Internal Medicine

## 2010-01-27 LAB — HM DIABETES FOOT EXAM

## 2010-01-30 LAB — CONVERTED CEMR LAB
ALT: 16 units/L (ref 0–35)
AST: 22 units/L (ref 0–37)
Basophils Absolute: 0 10*3/uL (ref 0.0–0.1)
Basophils Relative: 0.1 % (ref 0.0–3.0)
Eosinophils Absolute: 0.3 10*3/uL (ref 0.0–0.7)
Eosinophils Relative: 5.2 % — ABNORMAL HIGH (ref 0.0–5.0)
HCT: 39 % (ref 36.0–46.0)
Hemoglobin: 13.1 g/dL (ref 12.0–15.0)
Hgb A1c MFr Bld: 6.2 % (ref 4.6–6.5)
Lymphocytes Relative: 45.7 % (ref 12.0–46.0)
Lymphs Abs: 2.4 10*3/uL (ref 0.7–4.0)
MCHC: 33.5 g/dL (ref 30.0–36.0)
MCV: 87.8 fL (ref 78.0–100.0)
Monocytes Absolute: 0.6 10*3/uL (ref 0.1–1.0)
Monocytes Relative: 11.4 % (ref 3.0–12.0)
Neutro Abs: 2 10*3/uL (ref 1.4–7.7)
Neutrophils Relative %: 37.6 % — ABNORMAL LOW (ref 43.0–77.0)
Platelets: 170 10*3/uL (ref 150.0–400.0)
RBC: 4.45 M/uL (ref 3.87–5.11)
RDW: 17.4 % — ABNORMAL HIGH (ref 11.5–14.6)
TSH: 5.53 microintl units/mL — ABNORMAL HIGH (ref 0.35–5.50)
WBC: 5.2 10*3/uL (ref 4.5–10.5)

## 2010-02-04 ENCOUNTER — Ambulatory Visit: Payer: Self-pay | Admitting: Internal Medicine

## 2010-02-14 ENCOUNTER — Ambulatory Visit: Payer: Self-pay | Admitting: Cardiovascular Disease

## 2010-02-14 LAB — CONVERTED CEMR LAB: POC INR: 3.2

## 2010-02-27 ENCOUNTER — Ambulatory Visit: Payer: Self-pay | Admitting: Internal Medicine

## 2010-02-27 LAB — CONVERTED CEMR LAB: POC INR: 3.7

## 2010-03-12 ENCOUNTER — Ambulatory Visit: Payer: Self-pay | Admitting: Internal Medicine

## 2010-03-14 ENCOUNTER — Ambulatory Visit: Payer: Self-pay | Admitting: Cardiology

## 2010-03-14 LAB — CONVERTED CEMR LAB: POC INR: 2.1

## 2010-03-17 LAB — CONVERTED CEMR LAB: TSH: 2.81 microintl units/mL (ref 0.35–5.50)

## 2010-04-03 ENCOUNTER — Ambulatory Visit: Payer: Self-pay | Admitting: Internal Medicine

## 2010-04-03 LAB — CONVERTED CEMR LAB: POC INR: 2.1

## 2010-04-09 ENCOUNTER — Observation Stay (HOSPITAL_COMMUNITY): Admission: EM | Admit: 2010-04-09 | Discharge: 2010-04-11 | Payer: Self-pay | Admitting: Emergency Medicine

## 2010-04-09 ENCOUNTER — Encounter (INDEPENDENT_AMBULATORY_CARE_PROVIDER_SITE_OTHER): Payer: Self-pay | Admitting: Internal Medicine

## 2010-04-09 ENCOUNTER — Ambulatory Visit: Payer: Self-pay | Admitting: Surgery

## 2010-04-11 ENCOUNTER — Encounter: Payer: Self-pay | Admitting: Internal Medicine

## 2010-04-14 ENCOUNTER — Encounter: Payer: Self-pay | Admitting: Cardiovascular Disease

## 2010-04-14 ENCOUNTER — Telehealth (INDEPENDENT_AMBULATORY_CARE_PROVIDER_SITE_OTHER): Payer: Self-pay | Admitting: *Deleted

## 2010-04-14 LAB — CONVERTED CEMR LAB
POC INR: 3.9
Prothrombin Time: 47.9 s

## 2010-04-15 ENCOUNTER — Encounter: Payer: Self-pay | Admitting: Cardiovascular Disease

## 2010-04-15 ENCOUNTER — Ambulatory Visit: Payer: Self-pay | Admitting: Internal Medicine

## 2010-04-15 DIAGNOSIS — R42 Dizziness and giddiness: Secondary | ICD-10-CM | POA: Insufficient documentation

## 2010-04-17 ENCOUNTER — Telehealth (INDEPENDENT_AMBULATORY_CARE_PROVIDER_SITE_OTHER): Payer: Self-pay | Admitting: *Deleted

## 2010-04-21 ENCOUNTER — Encounter: Payer: Self-pay | Admitting: Cardiology

## 2010-04-21 LAB — CONVERTED CEMR LAB
POC INR: 3.1
Prothrombin Time: 37.7 s

## 2010-04-24 ENCOUNTER — Encounter (INDEPENDENT_AMBULATORY_CARE_PROVIDER_SITE_OTHER): Payer: Self-pay | Admitting: *Deleted

## 2010-05-01 ENCOUNTER — Encounter (INDEPENDENT_AMBULATORY_CARE_PROVIDER_SITE_OTHER): Payer: Self-pay | Admitting: *Deleted

## 2010-05-01 ENCOUNTER — Encounter: Payer: Self-pay | Admitting: Internal Medicine

## 2010-05-01 ENCOUNTER — Telehealth (INDEPENDENT_AMBULATORY_CARE_PROVIDER_SITE_OTHER): Payer: Self-pay | Admitting: *Deleted

## 2010-05-01 LAB — CONVERTED CEMR LAB
INR: 2.4
POC INR: 2.4
Prothrombin Time: 29.3 s
Prothrombin Time: 29.3 s

## 2010-05-08 ENCOUNTER — Encounter: Payer: Self-pay | Admitting: Internal Medicine

## 2010-05-09 ENCOUNTER — Ambulatory Visit: Payer: Self-pay | Admitting: Internal Medicine

## 2010-05-14 ENCOUNTER — Telehealth: Payer: Self-pay | Admitting: Internal Medicine

## 2010-05-15 ENCOUNTER — Encounter (INDEPENDENT_AMBULATORY_CARE_PROVIDER_SITE_OTHER): Payer: Self-pay | Admitting: *Deleted

## 2010-05-15 ENCOUNTER — Telehealth: Payer: Self-pay | Admitting: Family Medicine

## 2010-05-16 ENCOUNTER — Encounter: Payer: Self-pay | Admitting: Internal Medicine

## 2010-05-23 ENCOUNTER — Encounter: Payer: Self-pay | Admitting: Internal Medicine

## 2010-05-23 ENCOUNTER — Encounter: Payer: Self-pay | Admitting: Cardiology

## 2010-05-24 ENCOUNTER — Encounter: Payer: Self-pay | Admitting: Internal Medicine

## 2010-05-27 ENCOUNTER — Telehealth: Payer: Self-pay | Admitting: Internal Medicine

## 2010-05-28 ENCOUNTER — Encounter: Payer: Self-pay | Admitting: Internal Medicine

## 2010-05-28 ENCOUNTER — Telehealth: Payer: Self-pay | Admitting: Internal Medicine

## 2010-06-02 ENCOUNTER — Telehealth (INDEPENDENT_AMBULATORY_CARE_PROVIDER_SITE_OTHER): Payer: Self-pay | Admitting: *Deleted

## 2010-06-06 ENCOUNTER — Ambulatory Visit: Payer: Self-pay | Admitting: Internal Medicine

## 2010-06-06 LAB — CONVERTED CEMR LAB: INR: 1.7

## 2010-06-27 ENCOUNTER — Ambulatory Visit: Payer: Self-pay | Admitting: Internal Medicine

## 2010-06-27 LAB — CONVERTED CEMR LAB: POC INR: 2.7

## 2010-07-08 ENCOUNTER — Telehealth (INDEPENDENT_AMBULATORY_CARE_PROVIDER_SITE_OTHER): Payer: Self-pay | Admitting: *Deleted

## 2010-07-24 ENCOUNTER — Ambulatory Visit: Payer: Self-pay | Admitting: Cardiovascular Disease

## 2010-07-24 LAB — CONVERTED CEMR LAB: POC INR: 2.8

## 2010-08-06 ENCOUNTER — Ambulatory Visit: Payer: Self-pay | Admitting: Internal Medicine

## 2010-08-06 DIAGNOSIS — M1990 Unspecified osteoarthritis, unspecified site: Secondary | ICD-10-CM | POA: Insufficient documentation

## 2010-08-07 ENCOUNTER — Ambulatory Visit: Payer: Self-pay | Admitting: Internal Medicine

## 2010-08-09 LAB — CONVERTED CEMR LAB
BUN: 20 mg/dL (ref 6–23)
CO2: 29 meq/L (ref 19–32)
Calcium: 8.9 mg/dL (ref 8.4–10.5)
Chloride: 103 meq/L (ref 96–112)
Cholesterol: 197 mg/dL (ref 0–200)
Creatinine, Ser: 1.2 mg/dL (ref 0.4–1.2)
GFR calc non Af Amer: 49.24 mL/min (ref >60)
Glucose, Bld: 94 mg/dL (ref 70–99)
HDL: 39.9 mg/dL (ref >39.00)
Hgb A1c MFr Bld: 6.3 % (ref 4.6–6.5)
LDL Cholesterol: 130 mg/dL — ABNORMAL HIGH (ref 0–99)
Potassium: 4.5 meq/L (ref 3.5–5.1)
Sodium: 141 meq/L (ref 135–145)
TSH: 0.62 microintl units/mL (ref 0.35–5.50)
Total CHOL/HDL Ratio: 5
Triglycerides: 134 mg/dL (ref 0.0–149.0)
VLDL: 26.8 mg/dL (ref 0.0–40.0)

## 2010-08-18 ENCOUNTER — Encounter (INDEPENDENT_AMBULATORY_CARE_PROVIDER_SITE_OTHER): Payer: Self-pay | Admitting: *Deleted

## 2010-08-21 ENCOUNTER — Ambulatory Visit: Payer: Self-pay

## 2010-09-12 ENCOUNTER — Ambulatory Visit
Admission: RE | Admit: 2010-09-12 | Discharge: 2010-09-12 | Payer: Self-pay | Source: Home / Self Care | Attending: Internal Medicine | Admitting: Internal Medicine

## 2010-09-12 ENCOUNTER — Telehealth: Payer: Self-pay | Admitting: Internal Medicine

## 2010-09-29 ENCOUNTER — Ambulatory Visit
Admission: RE | Admit: 2010-09-29 | Discharge: 2010-09-29 | Payer: Self-pay | Source: Home / Self Care | Attending: Cardiology | Admitting: Cardiology

## 2010-09-29 ENCOUNTER — Encounter: Payer: Self-pay | Admitting: Internal Medicine

## 2010-09-29 LAB — CONVERTED CEMR LAB: POC INR: 1.2

## 2010-10-06 ENCOUNTER — Encounter: Payer: Self-pay | Admitting: Internal Medicine

## 2010-10-07 NOTE — Progress Notes (Signed)
Summary: REFILL  Phone Note Refill Request Message from:  Fax from Pharmacy on November 04, 2009 1:35 PM  PRODIGY TEST STRIP [facility] FA[facility] [phone]   Method Requested: Fax to Local Pharmacy Next Appointment Scheduled: 5.23.2011 Initial call taken by: Barb Merino,  November 04, 2009 1:37 PM    New/Updated Medications: PRODIGY BLOOD GLUCOSE TEST  STRP (GLUCOSE BLOOD) check bs 1x daily, dx 250.00 Prescriptions: PRODIGY BLOOD GLUCOSE TEST  STRP (GLUCOSE BLOOD) check bs 1x daily, dx 250.00  #100 x 3   Entered by:   Shary Decamp   Authorized by:   Nolon Rod. Paz MD   Signed by:   Shary Decamp on 11/04/2009   Method used:   Electronically to        [facility]  [phone][facility] (retail)       [address] [place], [place]  [address]       Ph: [phone] or [phone]       Fax: [phone]   RxID:   [id number]

## 2010-10-07 NOTE — Progress Notes (Signed)
  Phone Note Call from Patient   Summary of Call: Patient wanting to know how much vitamin d she should be taking - advised 1000-1200 units daily [facility]  June 19, 2009 2:25 PM

## 2010-10-07 NOTE — Assessment & Plan Note (Signed)
Summary: FOR BACK,LEG PAIN--PH   Vital Signs:  Patient Profile:   75 Years Old Female Height:     65 inches Weight:      187.8 pounds Temp:     98.3 degrees F BP sitting:   130 / 80  Vitals Entered By: Shary Decamp (May 16, 2008 3:48 PM)                 Chief Complaint:  pain in rt hip down rt leg x 1 week.  History of Present Illness: R LBP x few days, radiates to R leg up to the knee @ antero-lateral aspect pain is steady, worse w/  waking and certain positions denies injury prior to pain, did fall a day ago (due to pain)    Prior Medication List:  MACROBID 100 MG CAPS (NITROFURANTOIN MONOHYD MACRO) Take 1 capsule by mouth once a day BUDEPRION XL 300 MG TB24 (BUPROPION HCL)  LEVOTHYROXINE SODIUM 100 MCG TABS (LEVOTHYROXINE SODIUM)  LISINOPRIL 10 MG TABS (LISINOPRIL) 1 by mouth qd LIPITOR 20 MG TABS (ATORVASTATIN CALCIUM) 1 by mouth qd WARFARIN SODIUM 6 MG TABS (WARFARIN SODIUM)  ALBUTEROL 90 MCG/ACT AERS (ALBUTEROL) 1-2 puffs q4-6h prn DIAZEPAM 2 MG  TABS (DIAZEPAM) 1/2-1 by mouth three times a day prn BONIVA 150 MG  TABS (IBANDRONATE SODIUM) 1 every month QVAR 80 MCG/ACT  AERS (BECLOMETHASONE DIPROPIONATE) 2 puffs bid TRAMADOL HCL 50 MG  TABS (TRAMADOL HCL) 1 by mouth qid as needed pain   Current Allergies (reviewed today): ! PCN ! SULFA  Past Medical History:    Reviewed history from 01/10/2008 and no changes required:       Asthma       COPD       Depression       DVT, hx of x multiple, on coumadin        Pulmonary embolism, hx of  x 2        GERD       Hypertension       increased homocysreine levels       Osteopenia       hypothyroidism       Hyperlipidemia  Past Surgical History:    Reviewed history from 09/11/2006 and no changes required:       Appendectomy       Cholecystectomy       Lumbar fusion,  in the 90s       Tubal ligation       Tonsillectomy     Review of Systems  GI      Denies abdominal pain.  GU      Denies  dysuria and hematuria.      no  b/b incontinence  MS      no LE edema   Physical Exam  General:     alert and well-developed.   Abdomen:     soft, non-tender, no hepatomegaly, and no splenomegaly.   Neurologic:     strenght at LEs: slt decrease at the R proximaly reflexex: decreased R patella gait at baseline    Impression & Recommendations:  Problem # 1:  BACK PAIN (ICD-724.5) Assessment: New LBP w/  radicular symptoms sx different to plain hip pain she had before MRI tramadol --not helping, switch temporarily to vicodin call if worse Her updated medication list for this problem includes:    Tramadol Hcl 50 Mg Tabs (Tramadol hcl) .Marland Kitchen... 1 by mouth qid as needed pain    Vicodin 5-500 Mg  Tabs (Hydrocodone-acetaminophen) .Marland Kitchen... 1-2 qid  Orders: Radiology Referral (Radiology)   Complete Medication List: 1)  Macrobid 100 Mg Caps (Nitrofurantoin monohyd macro) .... Take 1 capsule by mouth once a day 2)  Budeprion Xl 300 Mg Tb24 (Bupropion hcl) 3)  Levothyroxine Sodium 100 Mcg Tabs (Levothyroxine sodium) 4)  Lisinopril 10 Mg Tabs (Lisinopril) .Marland Kitchen.. 1 by mouth qd 5)  Lipitor 20 Mg Tabs (Atorvastatin calcium) .Marland Kitchen.. 1 by mouth qd 6)  Warfarin Sodium 6 Mg Tabs (Warfarin sodium) 7)  Albuterol 90 Mcg/act Aers (Albuterol) .Marland Kitchen.. 1-2 puffs q4-6h prn 8)  Diazepam 2 Mg Tabs (Diazepam) .... 1/2-1 by mouth three times a day prn 9)  Boniva 150 Mg Tabs (Ibandronate sodium) .Marland Kitchen.. 1 every month 10)  Qvar 80 Mcg/act Aers (Beclomethasone dipropionate) .... 2 puffs bid 11)  Tramadol Hcl 50 Mg Tabs (Tramadol hcl) .Marland Kitchen.. 1 by mouth qid as needed pain 12)  Vicodin 5-500 Mg Tabs (Hydrocodone-acetaminophen) .Marland Kitchen.. 1-2 qid  Other Orders: Protime (65784ON) Fingerstick (62952)   Patient Instructions: 1)  same coumadin 2)  recheck in 2 weeks   Prescriptions: VICODIN 5-500 MG TABS (HYDROCODONE-ACETAMINOPHEN) 1-2 qid  #40 x 0   Entered by:   Shary Decamp   Authorized by:   Nolon Rod. Waynette Towers MD   Signed  by:   Shary Decamp on 05/16/2008   Method used:   Printed then faxed to ...       [facility]  [phone]* (retail)       [address] [place], [place]  [address]       Ph: [phone] or [phone]       Fax: [phone]   RxID:   [id number]  ] Laboratory Results   Blood Tests     PT: 18.8 s   (Normal Range: 10.6-13.4)  INR: 2.4   (Normal Range: 0.88-1.12   Therap INR: 2.0-3.5) Comments: CURRENT DOSE:6MG  TABLETS -- 1/2 by mouth once daily EXCEPT 1 TABLET ON TUES, THURS, & SAT NO CHANGE .Marland KitchenMarland KitchenMarland KitchenShary Decamp  May 16, 2008 3:49 PM 2 weeks Tullio Chausse E. Cyndra Feinberg MD  May 16, 2008 4:47 PM

## 2010-10-07 NOTE — Assessment & Plan Note (Signed)
Summary: rto 3 months/cbs   Vital Signs:  Patient profile:   75 year old female Height:      65 inches Weight:      198.8 pounds BMI:     33.20 Pulse rate:   68 / minute Pulse rhythm:   regular BP sitting:   142 / 80  (left arm) Cuff size:   large  Vitals Entered By: Shary Decamp (June 04, 2009 12:45 PM) CC: rov Comments  - "gets off balance a lot"  - "tiny" HA  Flu Vaccine Consent Questions     Do you have a history of severe allergic reactions to this vaccine? no    Any prior history of allergic reactions to egg and/or gelatin? no    Do you have a sensitivity to the preservative Thimersol? no    Do you have a past history of Guillan-Barre Syndrome? no    Do you currently have an acute febrile illness? no    Have you ever had a severe reaction to latex? no    Vaccine information given and explained to patient? yes    Are you currently pregnant? no    Lot Number:[id number]   Exp Date:03/06/2010   Site Given  Left Deltoid IM  .......Marland KitchenShary Decamp  June 04, 2009 12:49 PM     History of Present Illness: ROV  "gets off balance a lot" (x years),  "tiny" HA sometimes  denies spinning  HA frontal,  occasionally sinus congestion  AODM-- not watching diet  Asthma, COPD-- has Qvar, does not use two times a day , mostly uses once a day  Hyperlipidemia-- base on last results, she re-started lipitor but  "sometimes forgets to take it "   Current Medications (verified): 1)  Budeprion Xl 300 Mg Tb24 (Bupropion Hcl) .Marland Kitchen.. 1 By Mouth Once Daily 2)  Levothyroxine Sodium 100 Mcg Tabs (Levothyroxine Sodium) .Marland Kitchen.. 1 By Mouth Once Daily 3)  Lisinopril 10 Mg Tabs (Lisinopril) .Marland Kitchen.. 1 By Mouth Qd 4)  Boniva 150 Mg  Tabs (Ibandronate Sodium) .Marland Kitchen.. 1 Every Month 5)  Qvar 80 Mcg/act  Aers (Beclomethasone Dipropionate) .... 2 Puffs Bid 6)  Coumadin 6 Mg Tabs (Warfarin Sodium) .... Take As Directed By Coumadin Clinic. 7)  Ergocalciferol 50000 Unit Caps (Ergocalciferol) .Marland Kitchen.. 1 By  Mouth Qwk X 12 Weeks Then Start Otc Vit D 1200 Units Daily 8)  Lipitor 20 Mg Tabs (Atorvastatin Calcium) .Marland Kitchen.. 1 By Mouth At Bedtime  Allergies (verified): 1)  ! Pcn 2)  ! Sulfa  Past History:  Past Medical History: AODM Asthma COPD Depression DVT, hx of x multiple, on coumadin  Pulmonary embolism, hx of  x 2  GERD Hypertension increased homocysreine levels Osteopenia hypothyroidism Hyperlipidemia  Past Surgical History: Reviewed history from 05/16/2008 and no changes required. Appendectomy Cholecystectomy Lumbar fusion,  in the 90s Tubal ligation Tonsillectomy  Social History: Reviewed history from 02/27/2009 and no changes required. Single has a boyfriend 2 kids lives by self doesn't drive sister handles all her financial affairs  tobacco--no currently a smoker ETOH-- socially   Review of Systems General:  Denies fever and weight loss. CV:  Denies chest pain or discomfort and swelling of feet. GI:  Denies diarrhea, nausea, and vomiting. MS:  Denies muscle aches. Psych:  + anxiety, "worry a lot about kids" occasionally hard time sleeping .  Physical Exam  General:  alert and well-developed.   Lungs:  normal respiratory effort, no intercostal retractions, no accessory muscle use, and normal breath sounds.  Heart:  normal rate, regular rhythm, no murmur, and no gallop.   Extremities:  no pretibial edema bilaterally  Neurologic:  neurological exam  is at baseline. Psych:  normally interactive, good eye contact, not anxious appearing, and not depressed appearing.     Impression & Recommendations:  Problem # 1:  DIABETES MELLITUS, TYPE II (ICD-250.00) labs , seen instructions Her updated medication list for this problem includes:    Lisinopril 10 Mg Tabs (Lisinopril) .Marland Kitchen... 1 by mouth qd  Problem # 2:  HYPERLIPIDEMIA (ICD-272.4) recently restarted on Lipitor, admits  to poor compliance. Encourage compliance, labs Her updated medication list for this  problem includes:    Lipitor 20 Mg Tabs (Atorvastatin calcium) .Marland Kitchen... 1 by mouth at bedtime  Problem # 3:  OSTEOPENIA (ICD-733.90) on ergocalciferol check vitamin D levels Her updated medication list for this problem includes:    Boniva 150 Mg Tabs (Ibandronate sodium) .Marland Kitchen... 1 every month    Ergocalciferol 50000 Unit Caps (Ergocalciferol) .Marland Kitchen... 1 by mouth qwk x 12 weeks then start otc vit d 1200 units daily  Problem # 4:  HYPOTHYROIDISM (ICD-244.9) labs  Her updated medication list for this problem includes:    Levothyroxine Sodium 100 Mcg Tabs (Levothyroxine sodium) .Marland Kitchen... 1 by mouth once daily  Labs Reviewed: TSH: 0.92 (11/20/2008)    HgBA1c: 6.1 (03/06/2009) Chol: 229 (03/06/2009)   HDL: 40.30 (03/06/2009)   LDL: 109 (09/13/2007)   TG: 110.0 (03/06/2009)  Complete Medication List: 1)  Budeprion Xl 300 Mg Tb24 (Bupropion hcl) .Marland Kitchen.. 1 by mouth once daily 2)  Levothyroxine Sodium 100 Mcg Tabs (Levothyroxine sodium) .Marland Kitchen.. 1 by mouth once daily 3)  Lisinopril 10 Mg Tabs (Lisinopril) .Marland Kitchen.. 1 by mouth qd 4)  Boniva 150 Mg Tabs (Ibandronate sodium) .Marland Kitchen.. 1 every month 5)  Qvar 80 Mcg/act Aers (Beclomethasone dipropionate) .... 2 puffs bid 6)  Coumadin 6 Mg Tabs (Warfarin sodium) .... Take as directed by coumadin clinic. 7)  Ergocalciferol 50000 Unit Caps (Ergocalciferol) .Marland Kitchen.. 1 by mouth qwk x 12 weeks then start otc vit d 1200 units daily 8)  Lipitor 20 Mg Tabs (Atorvastatin calcium) .Marland Kitchen.. 1 by mouth at bedtime  Other Orders: Flu Vaccine 83yrs + (16109) Administration Flu vaccine - MCR (U0454)  Patient Instructions: 1)  take your medications as prescribed every day 2)  came back fasting in 4 weeks for bloodwork: 3)  FLP AST ALT  Dx high chol 4)  A1C  Dx DM 5)  Vit D  Dx osteoporosis  6)  TSH  Dx  hypothyroidism 7)  Please schedule a follow-up appointment in 4 months .

## 2010-10-07 NOTE — Assessment & Plan Note (Signed)
Summary: 4 MONTH OV//PH   Vital Signs:  Patient profile:   75 year old female Height:      65 inches Weight:      198 pounds BMI:     33.07 Pulse rate:   60 / minute BP sitting:   140 / 80  Vitals Entered By: Kandice Hams (October 04, 2009 12:56 PM) CC: 4 month followup   History of Present Illness: AODM-- no ambulatory CBGs   Asthma, COPD-- had a flu shot   Depression-- forgot bupropion x a while, felt poorly and emotionally,back on it now and feels better  Rosacea --Cleocin cream not helping, triamcinolone seems to help better  Osteopenia-- good medication compliance w/ boniva   Hyperlipidemia--  good medication compliance    Allergies: 1)  ! Pcn 2)  ! Sulfa  Past History:  Past Medical History: AODM Asthma COPD Depression DVT, hx of x multiple, on coumadin  Pulmonary embolism, hx of  x 2  GERD Hypertension increased homocysreine levels Osteopenia hypothyroidism Hyperlipidemia  Past Surgical History: Reviewed history from 05/16/2008 and no changes required. Appendectomy Cholecystectomy Lumbar fusion,  in the 90s Tubal ligation Tonsillectomy  Social History: Reviewed history from 02/27/2009 and no changes required. Single has a boyfriend 2 kids lives by self doesn't drive sister handles all her financial affairs  tobacco--no currently a smoker ETOH-- socially   Review of Systems CV:  Denies chest pain or discomfort and swelling of feet. Resp:  Denies cough and shortness of breath. GI:  Denies diarrhea, nausea, and vomiting.  Physical Exam  General:  alert, well-developed, and well-nourished.   Lungs:  normal respiratory effort, no intercostal retractions, no accessory muscle use, and normal breath sounds.   Heart:  normal rate, regular rhythm, no murmur, and no gallop.   Extremities:  no pretibial edema bilaterally  Skin:  rash at both sides of the face, mostly around the nose: scally-red, small patches , no blisters    Impression &  Recommendations:  Problem # 1:  DIABETES MELLITUS, TYPE II (ICD-250.00) diet controlled provided the patient with a glucometer, CBG goals discussed boyfriend will help w/  CBGs (patient has tremor, hard for her to use glucometer) advice to see the eye doctor yearly Her updated medication list for this problem includes:    Lisinopril 10 Mg Tabs (Lisinopril) .Marland Kitchen... 1 by mouth qd  Orders: TLB-Microalbumin/Creat Ratio, Urine (82043-MALB)  Labs Reviewed: Creat: 1.2 (11/28/2008)    Reviewed HgBA1c results: 6.4 (07/09/2009)  6.1 (03/06/2009)  Problem # 2:  ACNE ROSACEA (ICD-695.3) not improving with Cleocin rosacea? Dermatology referral  Orders: Dermatology Referral (Derma)  Problem # 3:  OSTEOPENIA (ICD-733.90) good medication compliance, status-post vitamin D replacement bone density test ordered Her updated medication list for this problem includes:    Boniva 150 Mg Tabs (Ibandronate sodium) .Marland Kitchen... 1 every month    Ergocalciferol 50000 Unit Caps (Ergocalciferol) .Marland Kitchen... 1 by mouth qwk x 12 weeks then start otc vit d 1200 units daily  Bone Density: osteopenia (09/29/2007) Vit D:36 (07/09/2009), 34 (07/09/2009)  Orders: Radiology Referral (Radiology)  Problem # 4:  HYPERTENSION (ICD-401.9) no change for now Her updated medication list for this problem includes:    Lisinopril 10 Mg Tabs (Lisinopril) .Marland Kitchen... 1 by mouth qd  Orders: Venipuncture (16109) TLB-BMP (Basic Metabolic Panel-BMET) (80048-METABOL)  BP today: 140/80 Prior BP: 142/80 (06/04/2009)  Labs Reviewed: K+: 4.4 (11/28/2008) Creat: : 1.2 (11/28/2008)   Chol: 167 (07/09/2009)   HDL: 50.60 (07/09/2009)   LDL: 98 (07/09/2009)  TG: 90.0 (07/09/2009)  Complete Medication List: 1)  Budeprion Xl 300 Mg Tb24 (Bupropion hcl) .Marland Kitchen.. 1 by mouth once daily 2)  Levothyroxine Sodium 100 Mcg Tabs (Levothyroxine sodium) .Marland Kitchen.. 1 by mouth once daily 3)  Lisinopril 10 Mg Tabs (Lisinopril) .Marland Kitchen.. 1 by mouth qd 4)  Boniva 150 Mg Tabs  (Ibandronate sodium) .Marland Kitchen.. 1 every month 5)  Qvar 80 Mcg/act Aers (Beclomethasone dipropionate) .... 2 puffs bid 6)  Coumadin 6 Mg Tabs (Warfarin sodium) .... Take as directed by coumadin clinic. 7)  Ergocalciferol 50000 Unit Caps (Ergocalciferol) .Marland Kitchen.. 1 by mouth qwk x 12 weeks then start otc vit d 1200 units daily 8)  Lipitor 20 Mg Tabs (Atorvastatin calcium) .Marland Kitchen.. 1 by mouth at bedtime 9)  Triamcinoline Cream 0.1%  .... Apply for rash on face per pt 10)  One Touch Delica Lancets Misc (Lancets) .... Checks blood sugar 1x day, dx 250.00 11)  One Touch Ultra Test Strips  .... Check blood sugar 1x day, dx 250.00  Patient Instructions: 1)  check your blood sugar once a day 2)  see your eye doctor yearly for a diabetes check up 3)  Please schedule a follow-up appointment in 4 months .  Prescriptions: ONE TOUCH ULTRA TEST STRIPS check blood sugar 1x day, dx 250.00  #1 mo supply x 6   Entered by:   Shary Decamp   Authorized by:   Nolon Rod. Edelin Fryer MD   Signed by:   Shary Decamp on 10/04/2009   Method used:   Faxed to ...       [facility]  [phone]* (retail)       [address] [place], [place]  [address]       Ph: [phone] or [phone]       Fax: [phone]   RxID:   [id number] ONE TOUCH DELICA LANCETS  MISC (LANCETS) checks blood sugar 1x day, dx 250.00  #1 mo supply x 5   Entered by:   Shary Decamp   Authorized by:   Nolon Rod. Caira Poche MD   Signed by:   Shary Decamp on 10/04/2009   Method used:   Electronically to        [facility]  [phone]* (retail)       [address] [place], [place]  [address]       Ph: [phone] or [phone]       Fax: [phone]   RxID:   [id number]

## 2010-10-07 NOTE — Assessment & Plan Note (Signed)
Summary: rov/swh   Vital Signs:  Patient profile:   75 year old female Height:      65 inches Weight:      194 pounds BMI:     32.40 Pulse rate:   76 / minute BP sitting:   110 / 80  (left arm) Cuff size:   large  Vitals Entered By: Shary Decamp (November 28, 2008 2:20 PM) Comments rov  - patient states she is only taking 4 medications -- but she is not sure what medications she is taking Shary Decamp  November 28, 2008 2:27 PM    History of Present Illness:  rov, here w/  partner  --patient states she is only taking 4 medications -- but she is not sure what medications she is taking --facial rash x 4 weeks: did not start as blisters, no itching or hurting , no previous symptoms like this  Current Medications (verified): 1)  Macrobid 100 Mg Caps (Nitrofurantoin Monohyd Macro) .... Take 1 Capsule By Mouth Once A Day 2)  Budeprion Xl 300 Mg Tb24 (Bupropion Hcl) 3)  Levothyroxine Sodium 100 Mcg Tabs (Levothyroxine Sodium) .Marland Kitchen.. 1 By Mouth Once Daily 4)  Lisinopril 10 Mg Tabs (Lisinopril) .Marland Kitchen.. 1 By Mouth Qd 5)  Lipitor 20 Mg Tabs (Atorvastatin Calcium) .Marland Kitchen.. 1 By Mouth Qd 6)  Jantoven 6 Mg Tabs (Warfarin Sodium) .... As Directed 7)  Albuterol 90 Mcg/act Aers (Albuterol) .Marland Kitchen.. 1-2 Puffs Q4-6h Prn 8)  Diazepam 2 Mg  Tabs (Diazepam) .... 1/2-1 By Mouth Three Times A Day Prn 9)  Boniva 150 Mg  Tabs (Ibandronate Sodium) .Marland Kitchen.. 1 Every Month 10)  Qvar 80 Mcg/act  Aers (Beclomethasone Dipropionate) .... 2 Puffs Bid 11)  Tramadol Hcl 50 Mg  Tabs (Tramadol Hcl) .Marland Kitchen.. 1 By Mouth Qid As Needed Pain 12)  Vicodin 5-500 Mg Tabs (Hydrocodone-Acetaminophen) .Marland Kitchen.. 1-2 Qid  Allergies (verified): 1)  ! Pcn 2)  ! Sulfa  Comments:  Nurse/Medical Assistant: The patient is currently on medications but does not know the name or dosage at this time. Instructed to contact our office with details. Will update medication list at that time.  Past History:  Past Medical History:    Reviewed history from  01/10/2008 and no changes required:    Asthma    COPD    Depression    DVT, hx of x multiple, on coumadin     Pulmonary embolism, hx of  x 2     GERD    Hypertension    increased homocysreine levels    Osteopenia    hypothyroidism    Hyperlipidemia  Past Surgical History:    Reviewed history from 05/16/2008 and no changes required:    Appendectomy    Cholecystectomy    Lumbar fusion,  in the 90s    Tubal ligation    Tonsillectomy  Review of Systems       no ambulatory BPs  goes to the coumadin clinic for INRs CV:  Denies chest pain or discomfort and shortness of breath with exertion. Resp:  Denies cough and sputum productive. GI:  no heartburn . Psych:  slightly  sad at times but denies overt depression , crying or suicidal ideas. Thinks mood related to the winter .  Physical Exam  General:  alert and well-developed.   Lungs:  normal respiratory effort, no intercostal retractions, no accessory muscle use, and normal breath sounds.   Heart:  normal rate, regular rhythm, no murmur, and no gallop.   Abdomen:  soft,  non-tender, no distention, no hepatomegaly, and no splenomegaly.   Extremities:  no pretibial edema bilaterally  Skin:  rash at both sides of the face, mostly around the nose: scally-red, small patches , no blisters    Impression & Recommendations:  Problem # 1:  medication compliance? see instructions   Problem # 2:  HYPERLIPIDEMIA (ICD-272.4)  Her updated medication list for this problem includes:    Lipitor 20 Mg Tabs (Atorvastatin calcium) .Marland Kitchen... 1 by mouth qd  Orders: TLB-ALT (SGPT) (84460-ALT) TLB-AST (SGOT) (84450-SGOT)  Labs Reviewed: SGOT: 29 (09/13/2007)   SGPT: 33 (09/13/2007)   HDL:55.4 (09/13/2007)  LDL:109 (09/13/2007)  Chol:185 (09/13/2007)  Trig:102 (09/13/2007)  Problem # 3:  HYPOTHYROIDISM (ICD-244.9)  recent TSH normal Her updated medication list for this problem includes:    Levothyroxine Sodium 100 Mcg Tabs (Levothyroxine  sodium) .Marland Kitchen... 1 by mouth once daily  Labs Reviewed: TSH: 0.92 (11/20/2008)    HgBA1c: 5.9 (09/13/2007) Chol: 185 (09/13/2007)   HDL: 55.4 (09/13/2007)   LDL: 109 (09/13/2007)   TG: 102 (09/13/2007)  Problem # 4:  HYPERTENSION (ICD-401.9) at goal  Her updated medication list for this problem includes:    Lisinopril 10 Mg Tabs (Lisinopril) .Marland Kitchen... 1 by mouth qd  Orders: TLB-CBC Platelet - w/Differential (85025-CBCD) TLB-BMP (Basic Metabolic Panel-BMET) (80048-METABOL)  BP today: 110/80 Prior BP: 124/80 (08/07/2008)  Labs Reviewed: K+: 4.3 (09/13/2007) Creat: : 1.2 (09/13/2007)   Chol: 185 (09/13/2007)   HDL: 55.4 (09/13/2007)   LDL: 109 (09/13/2007)   TG: 102 (09/13/2007)  Problem # 5:  ACNE ROSACEA (ICD-695.3) likely has rosacea , start cleocin lotion  Complete Medication List: 1)  Macrobid 100 Mg Caps (Nitrofurantoin monohyd macro) .... Take 1 capsule by mouth once a day 2)  Budeprion Xl 300 Mg Tb24 (Bupropion hcl) 3)  Levothyroxine Sodium 100 Mcg Tabs (Levothyroxine sodium) .Marland Kitchen.. 1 by mouth once daily 4)  Lisinopril 10 Mg Tabs (Lisinopril) .Marland Kitchen.. 1 by mouth qd 5)  Lipitor 20 Mg Tabs (Atorvastatin calcium) .Marland Kitchen.. 1 by mouth qd 6)  Jantoven 6 Mg Tabs (Warfarin sodium) .... As directed 7)  Albuterol 90 Mcg/act Aers (Albuterol) .Marland Kitchen.. 1-2 puffs q4-6h prn 8)  Diazepam 2 Mg Tabs (Diazepam) .... 1/2-1 by mouth three times a day prn 9)  Boniva 150 Mg Tabs (Ibandronate sodium) .Marland Kitchen.. 1 every month 10)  Qvar 80 Mcg/act Aers (Beclomethasone dipropionate) .... 2 puffs bid 11)  Tramadol Hcl 50 Mg Tabs (Tramadol hcl) .Marland Kitchen.. 1 by mouth qid as needed pain 12)  Vicodin 5-500 Mg Tabs (Hydrocodone-acetaminophen) .Marland Kitchen.. 1-2 qid 13)  Cleocin-t 1 % Lotn (Clindamycin phosphate) .... Apply to the face two times a day  Other Orders: Venipuncture ([address])  Patient Instructions: 1)  please bring all the bottles of the medicines you are taking and show them to my nurse 2)  Please schedule a PHYSICAL in 3 months ,  FASTING Prescriptions: CLEOCIN-T 1 % LOTN (CLINDAMYCIN PHOSPHATE) apply to the face two times a day  #1 x 3   Entered and Authorized by:   Elita Quick E. Azari Janssens MD   Signed by:   Nolon Rod. Zeus Marquis MD on 11/28/2008   Method used:   Print then Give to Patient   RxID:   [id number]

## 2010-10-07 NOTE — Letter (Signed)
Summary: Results Follow up Letter  [place] at [facility]  [address] [place], [place] [address]   Phone: [phone]  Fax: [phone]    10/10/2007 MRN: [id number]  NARYIAH SCHLEY [address] [place], [place]  [address]  Dear Ms. Hush,  The following are the results of your recent test(s):  Test         Result    Pap Smear:        Normal _____  Not Normal _____ Comments: ______________________________________________________ Cholesterol: LDL(Bad cholesterol):         Your goal is less than:         HDL (Good cholesterol):       Your goal is more than: Comments:  ______________________________________________________ Mammogram:        Normal _x____  Not Normal _____ Comments:  Your mammogram was normal!!!  Next mammogram due in 1 year! ___________________________________________________________________ Hemoccult:        Normal _____  Not normal _______ Comments:    _____________________________________________________________________ Other Tests:    We routinely do not discuss normal results over the telephone.  If you desire a copy of the results, or you have any questions about this information we can discuss them at your next office visit.   Sincerely,

## 2010-10-07 NOTE — Miscellaneous (Signed)
Summary: Supplemental Order/[facility]  Supplemental Order/[facility]   Imported By: Sherian Rein 05/30/2010 14:30:48  _____________________________________________________________________  External Attachment:    Type:   Image     Comment:   External Document

## 2010-10-07 NOTE — Letter (Signed)
Summary: Results Follow-up Letter  [place] at [facility]  [address] [place], [place] [address]   Phone: [phone]  Fax: [phone]    12/06/2008        Ameliya Nicotra [address] [place], [place]  [address]  Dear Ms. Sabourin,   The following are the results of your recent test(s):  Test     Result     Pap Smear    Normal_______  Not Normal_____       Comments: _________________________________________________________ Cholesterol LDL(Bad cholesterol):          Your goal is less than:         HDL (Good cholesterol):        Your goal is more than: _________________________________________________________ Other Tests:   _________________________________________________________  Please call for an appointment Or Please see attached labs._________________________________________________________ _________________________________________________________ _________________________________________________________  Sincerely,  Felecia Deloach CMA [place] at [facility]

## 2010-10-07 NOTE — Medication Information (Signed)
Summary: rov coumadin - [facility]  Anticoagulant Therapy  Managed by: Leota Sauers, PharmD, BCPS, CPP Supervising MD: Daleen Squibb MD, Maisie Fus Indication 1: Deep Vein Thrombosis - Leg (ICD-451.1) Indication 2: CVA-stroke (ICD-436) Lab Used: [facility] Site: [facility] INR RANGE 2 - 3  Dietary changes: no    Health status changes: no    Bleeding/hemorrhagic complications: no    Recent/future hospitalizations: no    Any changes in medication regimen? no    Recent/future dental: no  Any missed doses?: no       Is patient compliant with meds? yes       Allergies: 1)  ! Pcn 2)  ! Sulfa  Anticoagulation Management History:      The patient is taking warfarin and comes in today for a routine follow up visit.  Positive risk factors for bleeding include an age of 73 years or older and presence of serious comorbidities.  The bleeding index is 'intermediate risk'.  Positive CHADS2 values include History of HTN and History of Diabetes.  Negative CHADS2 values include Age > 80 years old.  The start date was 05/16/2007.  Her last INR was 8.5 RATIO.  Anticoagulation responsible provider: Daleen Squibb MD, Maisie Fus.  Cuvette Lot#: [id number].  Exp: 04/2011.    Anticoagulation Management Assessment/Plan:      The patient's current anticoagulation dose is Coumadin 6 mg tabs: Take as directed by coumadin clinic..  The target INR is 2 - 3.  The next INR is due 02/14/2010.  Anticoagulation instructions were given to patient.  Results were reviewed/authorized by Leota Sauers, PharmD, BCPS, CPP.  She was notified by [facility].         Prior Anticoagulation Instructions: INR 2.3  Coumadin 1 tab = 6mg  on Tue and Fri  1/2 tab all other days  Current Anticoagulation Instructions: INR-1.3 Take 1 tablet tomorrow 02/05/10. Then resume normal schedule. Take 1 tablet on Tuesday and Friday and take 1/2 a tablet on all other days. Recheck in 10 days.

## 2010-10-07 NOTE — Miscellaneous (Signed)
Summary: PT INR Order/[facility]  PT INR Order/[facility]   Imported By: Lanelle Bal 05/08/2010 10:58:56  _____________________________________________________________________  External Attachment:    Type:   Image     Comment:   External Document

## 2010-10-07 NOTE — Miscellaneous (Signed)
Summary: update med from home health   Synthroid dose apparently was changed at the hospital Please ask patient to go back to her previous dose of 137 micrograms daily Dawn E. Paz MD  April 24, 2010 8:26 PM   Left message for pt to call back. [name] CMA  April 25, 2010 8:49 AM  I spoke with pt- she needs a new rx sent to pharm. She is aware of the change. [name] CMA  April 28, 2010 11:53 AM   Medications Added LEVOTHROID 100 MCG TABS (LEVOTHYROXINE SODIUM) 1 by mouth daily SYNTHROID 137 MCG TABS (LEVOTHYROXINE SODIUM) 1 by mouth daily.       Clinical Lists Changes  Medications: Changed medication from LEVOTHYROXINE SODIUM 137 MCG TABS (LEVOTHYROXINE SODIUM) 1 by mouth once daily to LEVOTHROID 100 MCG TABS (LEVOTHYROXINE SODIUM) 1 by mouth daily - Signed Removed medication of ZOFRAN 4 MG TABS (ONDANSETRON HCL) take 1 by mouth q6hrs as needed - Signed Changed medication from LEVOTHROID 100 MCG TABS (LEVOTHYROXINE SODIUM) 1 by mouth daily to SYNTHROID 137 MCG TABS (LEVOTHYROXINE SODIUM) 1 by mouth daily. - Signed Rx of SYNTHROID 137 MCG TABS (LEVOTHYROXINE SODIUM) 1 by mouth daily.;  #30 x 1;  Signed;  Entered by: [name] CMA;  Authorized by: Nolon Rod Paz MD;  Method used: Electronically to [facility]  [phone]*, [address], [place], [place]  [address], Ph: [phone] or [phone], Fax: [phone]    Prescriptions: SYNTHROID 137 MCG TABS (LEVOTHYROXINE SODIUM) 1 by mouth daily.  #30 x 1   Entered by:   [name] CMA   Authorized by:   Nolon Rod. Paz MD   Signed by:   [name] CMA on 04/28/2010   Method used:   Electronically to        [facility]  [phone]* (retail)       [address] [place], [place]  [address]       Ph: [phone] or [phone]       Fax: [phone]   RxID:   [id number]

## 2010-10-07 NOTE — Progress Notes (Signed)
Summary: hip pain /dr paz see   Phone Note Call from Patient Call back at [phone]   Caller: Patient Reason for Call: Talk to Nurse Summary of Call: dr. Drue Novel pt was seen on tuesday for her cpx. she is having pain from her hip shooting down to her legs. pt says that she spoke with dr. on her visit about her hip pain. she wanted to know what she should for her the pain.  Initial call taken by: Charolette Child,  September 15, 2007 10:15 AM  Follow-up for Phone Call        pt said having pain from hip shooting down from knee on right side not swollen just painful mention at office visit on tue 1/6 would like something using diazepam pt said to help relax need   [facility]--please advise...................................................................Marland KitchenKandice Hams  September 15, 2007 2:30 PM  Follow-up by: Kandice Hams,  September 15, 2007 2:30 PM  Additional Follow-up for Phone Call Additional follow up Details #1::        ok diazepam Tylenol 500 OTC 2 by mouth qid as needed Set up XR of hip Call if no better: referal Additional Follow-up by: Jose E. Paz MD,  September 16, 2007 4:28 PM  New Problems: HIP PAIN, RIGHT, CHRONIC (ICD-719.45) * R HIP PAIN   Additional Follow-up for Phone Call Additional follow up Details #2::    s/w pt informed per dr Drue Novel ok for diazepam,and can take otc 500mg  2  4 times a day as needed also xray ordered for hip can go to [facility] for that...................................................................Marland KitchenKandice Hams  September 16, 2007 5:01 PM     Follow-up by: Kandice Hams,  September 16, 2007 5:01 PM  New Problems: HIP PAIN, RIGHT, CHRONIC (ICD-719.45) * R HIP PAIN      Appended Document: hip pain /dr paz see here it is [place]

## 2010-10-07 NOTE — Progress Notes (Signed)
Summary: Home Health Missed visit  Phone Note From Other Clinic   Caller: Ava--Home Health RN-- [phone] Summary of Call: FYI--Missed visit. Ava called stating that she went out to see the patient this morning as scheduled, and the patient was not home.  Initial call taken by: Lucious Groves CMA,  May 27, 2010 10:58 AM

## 2010-10-07 NOTE — Medication Information (Signed)
Summary: rov/sp  Anticoagulant Therapy  Managed by: Weston Brass, PharmD Supervising MD: Antoine Poche MD, Fayrene Fearing Indication 1: Deep Vein Thrombosis - Leg (ICD-451.1) Indication 2: CVA-stroke (ICD-436) Lab Used: [facility] Site: [facility] INR POC 2.1 INR RANGE 2 - 3  Dietary changes: no    Health status changes: no    Bleeding/hemorrhagic complications: no    Recent/future hospitalizations: no    Any changes in medication regimen? no    Recent/future dental: no  Any missed doses?: no       Is patient compliant with meds? yes       Allergies: 1)  ! Pcn 2)  ! Sulfa  Anticoagulation Management History:      The patient is taking warfarin and comes in today for a routine follow up visit.  Positive risk factors for bleeding include an age of 73 years or older and presence of serious comorbidities.  The bleeding index is 'intermediate risk'.  Positive CHADS2 values include History of HTN and History of Diabetes.  Negative CHADS2 values include Age > 51 years old.  The start date was 05/16/2007.  Her last INR was 8.5 RATIO.  Anticoagulation responsible provider: Antoine Poche MD, Fayrene Fearing.  INR POC: 2.1.  Cuvette Lot#: [id number].  Exp: 05/2011.    Anticoagulation Management Assessment/Plan:      The patient's current anticoagulation dose is Coumadin 6 mg tabs: Take as directed by coumadin clinic..  The target INR is 2 - 3.  The next INR is due 04/03/2010.  Anticoagulation instructions were given to patient.  Results were reviewed/authorized by Weston Brass, PharmD.  She was notified by Weston Brass PharmD.         Prior Anticoagulation Instructions: INR 3.7  Skip tomorrow's dose of Coumadin then resume same dose of 1/2 tablet every day except 1 tablet on Tuesday and Friday.  Increase greens to 2 servings a week.  Recheck in 2 weeks.   Current Anticoagulation Instructions: INR 2.1  Continue same dose of 1/2 tablet every day except 1 tablet on Tuesday and Friday.

## 2010-10-07 NOTE — Medication Information (Signed)
Summary: Coumadin Clinic  Anticoagulant Therapy  Managed by: Weston Brass, PharmD Supervising MD: Gala Romney MD,Daniel Indication 1: Deep Vein Thrombosis - Leg (ICD-451.1) Indication 2: CVA-stroke (ICD-436) Lab Used: [facility]  Site: [facility] PT 47.9 INR POC 3.9 INR RANGE 2 - 3  Dietary changes: no    Health status changes: no    Bleeding/hemorrhagic complications: no    Recent/future hospitalizations: yes       Details: pt discharged on 8/6 with vertigo.    Recent/future dental: no  Any missed doses?: no       Is patient compliant with meds? yes      Comments: Lab drawn by [facility] on 8/8.  Received in Coumadin Clinic on 8/9  Allergies: 1)  ! Pcn 2)  ! Sulfa  Anticoagulation Management History:      Her anticoagulation is being managed by telephone today.  Positive risk factors for bleeding include an age of 17 years or older and presence of serious comorbidities.  The bleeding index is 'intermediate risk'.  Positive CHADS2 values include History of HTN and History of Diabetes.  Negative CHADS2 values include Age > 80 years old.  The start date was 05/16/2007.  Her last INR was 8.5 RATIO.  Prothrombin time is 47.9.  Anticoagulation responsible provider: Bensimhon MD,Daniel.  INR POC: 3.9.  Exp: 06/08/2011.    Anticoagulation Management Assessment/Plan:      The patient's current anticoagulation dose is Coumadin 6 mg tabs: Take as directed by coumadin clinic..  The target INR is 2 - 3.  The next INR is due 04/21/2010.  Anticoagulation instructions were given to patient.  Results were reviewed/authorized by Weston Brass, PharmD.  She was notified by Weston Brass PharmD.         Prior Anticoagulation Instructions: Continue same: 3mg  daily except 6mg  on Tues and Fri.  Current Anticoagulation Instructions: INR 3.9  Spoke with pt's caregiver.  Skip today's dose of Coumadin then resume previous dose of 1/2 tablet every day except 1 tablet on Tuesday and Friday.  Recheck INR in  1 week.  Gave orders to Elizabeth with [employer] [facility]

## 2010-10-07 NOTE — Progress Notes (Signed)
Summary: PA initiated  Phone Note Other Incoming Call back at [phone]   Caller[facility] Details for Reason: Actonel not covered, needs to try fosamax, boniva or call for PA [phone] Summary of Call: prior auth initiated.... Initial call taken by: Shary Decamp,  October 21, 2009 2:38 PM  Follow-up for Phone Call        called to check status, per automated VM @ insurance company - Actonel has been approved, pharmacy advised Shary Decamp  October 24, 2009 4:14 PM

## 2010-10-07 NOTE — Assessment & Plan Note (Signed)
Summary: 2 MONTH FOLLOWUP///SPH   Vital Signs:  Patient profile:   75 year old female Weight:      198 pounds Pulse rate:   82 / minute Pulse rhythm:   regular BP sitting:   128 / 88  (left arm) Cuff size:   large  Vitals Entered By: [name] CMA (August 06, 2010 2:15 PM) CC: 2 month f/u- not fasting Comments c/o (L) leg pain states sometimes she cannot move it.  [facility]    History of Present Illness: ROV  c/o (L) knee  pain for many months, on and off Continue with her chronic on and of back pain      Current Medications (verified): 1)  Budeprion Xl 300 Mg Tb24 (Bupropion Hcl) .Marland Kitchen.. 1 By Mouth Once Daily 2)  Levothroid 137 Mcg Tabs (Levothyroxine Sodium) .Marland Kitchen.. 1 By Mouth Daily 3)  Lisinopril 10 Mg Tabs (Lisinopril) .Marland Kitchen.. 1 By Mouth Qd 4)  Actonel 150 Mg Tabs (Risedronate Sodium) .Marland Kitchen.. 1 By Mouth One Time A Month - Stop Boniva 5)  Qvar 40 Mcg/act Aers (Beclomethasone Dipropionate) .... Bid 6)  Coumadin 6 Mg Tabs (Warfarin Sodium) .... Take As Directed By Coumadin Clinic. 7)  Lipitor 20 Mg Tabs (Atorvastatin Calcium) .Marland Kitchen.. 1 By Mouth At Bedtime 8)  One Touch Delica Lancets  Misc (Lancets) .... Checks Blood Sugar 1x Day, Dx 250.00 9)  Prodigy Blood Glucose Test  Strp (Glucose Blood) .... Check Bs 1x Daily, Dx 250.00 10)  Ventolin Hfa 108 (90 Base) Mcg/act Aers (Albuterol Sulfate) .... 2 Puffs Every 4 Hours As Needed For Shortness of Breath or Wheezing 11)  Coreg 12.5 Mg Tabs (Carvedilol) .... Take 1 By Mouth Daily 12)  Antivert 25 Mg Tabs (Meclizine Hcl) .Marland Kitchen.. 1 By Mouth Daily At Bedtime. 13)  Prodigy Preferred Monitor W/device Kit (Blood Glucose Monitoring Suppl) .... As Directed. 14)  Prodigy Lancing Device  Misc Occupational hygienist Devices) .... Two Times A Day 15)  Prodigy Blood Glucose Test  Strp (Glucose Blood) .... Two Times A Day 16)  Vitamin D3 2000 Unit Caps (Cholecalciferol) .... Qd  Allergies (verified): 1)  ! Pcn 2)  ! Sulfa  Past History:  Past Medical  History: AODM Asthma, COPD Depression DVT, hx of x multiple, on coumadin  Pulmonary embolism, hx of  x 2  GERD Hypertension increased homocysreine levels Osteopenia hypothyroidism Hyperlipidemia Osteoarthritis (h/o spinal stenosis by MRI 2009, ongoing back pain)  Past Surgical History: Reviewed history from 05/16/2008 and no changes required. Appendectomy Cholecystectomy Lumbar fusion,  in the 90s Tubal ligation Tonsillectomy  Review of Systems       doing well as far as her DM: Does not check CBGs, not following a diabetic diet as far as her asthma/COPD no major problems with shortness of breath or cough. Occasionally forgets her inhalers had a flu shot  General:  Denies fever.  Physical Exam  General:  alert, well-developed, and overweight-appearing.   Lungs:  normal respiratory effort, no intercostal retractions, and no accessory muscle use.  slightly decreased breath sounds Heart:  normal rate, regular rhythm, no murmur, and no gallop.   Extremities:  no pretibial edema is with deformities consistent with DJD. Left knee seems slightly warm, no red, slightly puffy. She has multiple varicose veins without evidence of phlebitis   Impression & Recommendations:  Problem # 1:  OSTEOARTHRITIS (ICD-715.90) knee pain for several months, on exam there is some puffiness and warmness Suspect DJD X-rays see instructions  orthopedic surgical  referral  (  after December 15, she is going out of town)  Her updated medication list for this problem includes:    Hydrocodone-acetaminophen 2.5-500 Mg Tabs (Hydrocodone-acetaminophen) ..... One or 2 by mouth every 6 hours as needed for pain  Orders: T-Knee Left 2 view (73560TC) Orthopedic Referral (Ortho)  Problem # 2:  HYPERLIPIDEMIA (ICD-272.4)  labs Her updated medication list for this problem includes:    Lipitor 20 Mg Tabs (Atorvastatin calcium) .Marland Kitchen... 1 by mouth at bedtime  Labs Reviewed: SGOT: 22 (01/27/2010)   SGPT:  16 (01/27/2010)   HDL:50.60 (07/09/2009), 40.30 (03/06/2009)  LDL:98 (07/09/2009), 109 (87/56/4332)  Chol:167 (07/09/2009), 229 (03/06/2009)  Trig:90.0 (07/09/2009), 110.0 (03/06/2009)  Orders: Venipuncture (95188) Specimen Handling (41660) TLB-TSH (Thyroid Stimulating Hormone) (84443-TSH) TLB-Lipid Panel (80061-LIPID) TLB-BMP (Basic Metabolic Panel-BMET) (80048-METABOL) TLB-A1C / Hgb A1C (Glycohemoglobin) (83036-A1C)  Problem # 3:  HYPOTHYROIDISM (ICD-244.9) labs Her updated medication list for this problem includes:    Levothroid 137 Mcg Tabs (Levothyroxine sodium) .Marland Kitchen... 1 by mouth daily  Labs Reviewed: TSH: 2.81 (03/12/2010)    HgBA1c: 6.2 (01/27/2010) Chol: 167 (07/09/2009)   HDL: 50.60 (07/09/2009)   LDL: 98 (07/09/2009)   TG: 90.0 (07/09/2009)  Orders: Venipuncture (63016) Specimen Handling (01093) TLB-TSH (Thyroid Stimulating Hormone) (84443-TSH) TLB-Lipid Panel (80061-LIPID) TLB-BMP (Basic Metabolic Panel-BMET) (80048-METABOL) TLB-A1C / Hgb A1C (Glycohemoglobin) (83036-A1C)  Problem # 4:  DIABETES MELLITUS, TYPE II (ICD-250.00) labs Her updated medication list for this problem includes:    Lisinopril 10 Mg Tabs (Lisinopril) .Marland Kitchen... 1 by mouth qd  Labs Reviewed: Creat: 1.2 (10/04/2009)     Last Eye Exam: normal (12/05/2009) Reviewed HgBA1c results: 6.2 (01/27/2010)  6.4 (07/09/2009)  Orders: Venipuncture (23557) Specimen Handling (32202) TLB-TSH (Thyroid Stimulating Hormone) (84443-TSH) TLB-Lipid Panel (80061-LIPID) TLB-BMP (Basic Metabolic Panel-BMET) (80048-METABOL) TLB-A1C / Hgb A1C (Glycohemoglobin) (83036-A1C)  Complete Medication List: 1)  Budeprion Xl 300 Mg Tb24 (Bupropion hcl) .Marland Kitchen.. 1 by mouth once daily 2)  Levothroid 137 Mcg Tabs (Levothyroxine sodium) .Marland Kitchen.. 1 by mouth daily 3)  Lisinopril 10 Mg Tabs (Lisinopril) .Marland Kitchen.. 1 by mouth qd 4)  Actonel 150 Mg Tabs (Risedronate sodium) .Marland Kitchen.. 1 by mouth one time a month - stop boniva 5)  Qvar 40 Mcg/act Aers  (Beclomethasone dipropionate) .... Bid 6)  Coumadin 6 Mg Tabs (Warfarin sodium) .... Take as directed by coumadin clinic. 7)  Lipitor 20 Mg Tabs (Atorvastatin calcium) .Marland Kitchen.. 1 by mouth at bedtime 8)  One Touch Delica Lancets Misc (Lancets) .... Checks blood sugar 1x day, dx 250.00 9)  Prodigy Blood Glucose Test Strp (Glucose blood) .... Check bs 1x daily, dx 250.00 10)  Ventolin Hfa 108 (90 Base) Mcg/act Aers (Albuterol sulfate) .... 2 puffs every 4 hours as needed for shortness of breath or wheezing 11)  Coreg 12.5 Mg Tabs (Carvedilol) .... Take 1 by mouth daily 12)  Antivert 25 Mg Tabs (Meclizine hcl) .Marland Kitchen.. 1 by mouth daily at bedtime. 13)  Prodigy Preferred Monitor W/device Kit (Blood glucose monitoring suppl) .... As directed. 14)  Prodigy Lancing Device Misc (Lancet devices) .... Two times a day 15)  Prodigy Blood Glucose Test Strp (Glucose blood) .... Two times a day 16)  Vitamin D3 2000 Unit Caps (Cholecalciferol) .... Qd 17)  Hydrocodone-acetaminophen 2.5-500 Mg Tabs (Hydrocodone-acetaminophen) .... One or 2 by mouth every 6 hours as needed for pain  Patient Instructions: 1)  for the knee pain, take hydrocodone as needed 2)  You may like to use a knee sleeve 3)  We'll send you to the orthopedic doctor 4)  please come back fasting 5)  FLP--dx high cholesterol 6)  BMP--- dx hypertension 7)  A1 C--- dx diabetes 8)  TSH ---dx hypothyroidism 9)  Please schedule a follow-up appointment in 3 months, physical exam Prescriptions: HYDROCODONE-ACETAMINOPHEN 2.5-500 MG TABS (HYDROCODONE-ACETAMINOPHEN) one or 2 by mouth every 6 hours as needed for pain  #40 x 0   Entered and Authorized by:   Nolon Rod. Gee Habig MD   Signed by:   Nolon Rod. Shanieka Blea MD on 08/06/2010   Method used:   Print then Give to Patient   RxID:   [id number]    Orders Added: 1)  T-Knee Left 2 view [73560TC] 2)  Venipuncture [14782] 3)  Specimen Handling [99000] 4)  TLB-TSH (Thyroid Stimulating Hormone) [84443-TSH] 5)   TLB-Lipid Panel [80061-LIPID] 6)  TLB-BMP (Basic Metabolic Panel-BMET) [80048-METABOL] 7)  TLB-A1C / Hgb A1C (Glycohemoglobin) [83036-A1C] 8)  Orthopedic Referral [Ortho] 9)  Est. Patient Level III [95621]

## 2010-10-07 NOTE — Medication Information (Signed)
Summary: rov/eac  Anticoagulant Therapy  Managed by: Cloyde Reams, RN, BSN Supervising MD: Clifton James MD, Cristal Deer Indication 1: Deep Vein Thrombosis - Leg (ICD-451.1) Indication 2: CVA-stroke (ICD-436) Lab Used: [facility] Site: [facility] INR POC 3.4 INR RANGE 2 - 3  Dietary changes: no    Health status changes: no    Bleeding/hemorrhagic complications: no    Recent/future hospitalizations: no    Any changes in medication regimen? no    Recent/future dental: no  Any missed doses?: yes     Details: Missed a couple doses last week  Is patient compliant with meds? yes       Current Medications (verified): 1)  Budeprion Xl 300 Mg Tb24 (Bupropion Hcl) .Marland Kitchen.. 1 By Mouth Once Daily 2)  Levothyroxine Sodium 100 Mcg Tabs (Levothyroxine Sodium) .Marland Kitchen.. 1 By Mouth Once Daily 3)  Lisinopril 10 Mg Tabs (Lisinopril) .Marland Kitchen.. 1 By Mouth Qd 4)  Boniva 150 Mg  Tabs (Ibandronate Sodium) .Marland Kitchen.. 1 Every Month 5)  Qvar 80 Mcg/act  Aers (Beclomethasone Dipropionate) .... 2 Puffs Bid 6)  Coumadin 6 Mg Tabs (Warfarin Sodium) .... Take As Directed By Coumadin Clinic. 7)  Ergocalciferol 50000 Unit Caps (Ergocalciferol) .Marland Kitchen.. 1 By Mouth Qwk X 12 Weeks Then Start Otc Vit D 1200 Units Daily 8)  Lipitor 20 Mg Tabs (Atorvastatin Calcium) .Marland Kitchen.. 1 By Mouth At Bedtime  Allergies (verified): 1)  ! Pcn 2)  ! Sulfa  Anticoagulation Management History:      The patient is taking warfarin and comes in today for a routine follow up visit.  Positive risk factors for bleeding include an age of 21 years or older and presence of serious comorbidities.  The bleeding index is 'intermediate risk'.  Positive CHADS2 values include History of HTN and History of Diabetes.  Negative CHADS2 values include Age > 45 years old.  The start date was 05/16/2007.  Her last INR was 8.5 RATIO.  Anticoagulation responsible provider: Clifton James MD, Cristal Deer.  INR POC: 3.4.  Cuvette Lot#: [id number].  Exp: 07/2010.    Anticoagulation  Management Assessment/Plan:      The patient's current anticoagulation dose is Coumadin 6 mg tabs: Take as directed by coumadin clinic..  The target INR is 2 - 3.  The next INR is due 07/22/2009.  Anticoagulation instructions were given to patient.  Results were reviewed/authorized by Cloyde Reams, RN, BSN.  She was notified by Cloyde Reams RN.         Prior Anticoagulation Instructions: INR  3.2    Take 1/2 tablet tomorrow (Friday). Then return to normal dosing schedule of 1 tablet on Wednesday, Thursday, and Friday, and 1/2 tablet all other days.  Return to clinic in 3 weeks  Current Anticoagulation Instructions: INR 3.4  Start taking 1/2 tablet daily except 1 tablet on Thursdays and Fridays.  Recheck 2-3 weeks.

## 2010-10-07 NOTE — Assessment & Plan Note (Signed)
Summary: 14months,pt check//tl  Medications Added BONIVA 150 MG  TABS (IBANDRONATE SODIUM) 1 every month        Vital Signs:  Patient Profile:   75 Years Old Female Weight:      179.4 pounds Pulse rate:   70 / minute BP sitting:   124 / 60  Vitals Entered By: Shary Decamp (July 22, 2007 12:53 PM)                 Chief Complaint:  rov.  History of Present Illness: ROV doing well    Past Medical History:    Asthma    COPD    Depression    DVT, hx of    GERD    Hypertension    Hyperthyroidism    Pulmonary embolism, hx of     increased homocysreine levels    Osteopenia   Family History:    n. c.  Social History:    Single    2 kids   Risk Factors:  Tobacco use:  quit Alcohol use:  no   Review of Systems  CV      Denies chest pain or discomfort.      slt LE edema  Resp      Denies cough, shortness of breath, and wheezing.   Physical Exam  General:     alert.   Lungs:     normal respiratory effort, no intercostal retractions, and no accessory muscle use.   Heart:     normal rate, regular rhythm, and no murmur.      Impression & Recommendations:  Problem # 1:  IMPAIRED FASTING GLUCOSE (ICD-790.21)  Labs Reviewed: HgBA1c: 6.2 (03/24/2007)   Creat: 1.3 (03/24/2007)      Problem # 2:  HYPERTHYROIDISM (ICD-242.90)  Labs Reviewed: TSH: 0.96 (06/29/2007)      Problem # 3:  HYPERTENSION (ICD-401.9)  Her updated medication list for this problem includes:    Lisinopril 10 Mg Tabs (Lisinopril) .Marland Kitchen... 1 by mouth qd  BP today: 124/60 Prior BP: 130/76 (03/24/2007)  Labs Reviewed: Creat: 1.3 (03/24/2007)   Problem # 4:  GERD (ICD-530.81) Asx  Problem # 5:  HEALTH SCREENING (ICD-V70.0) chart reviewed yearly @ next OV EGD 2006: HH gastritis Cscope 07-08-05 colon polyp, int hemorroids, TICs, Bx tubular adenomas PAP 07-2005, next 2009 MMG >1 year, will schedule one (likes to do it in January) breast exam on RTC DEXA 11-2004  Hip osteopenia, order one (likes to do it in January) pneumonia shot 07-2004  Complete Medication List: 1)  Macrobid 100 Mg Caps (Nitrofurantoin monohyd macro) .... Take 1 capsule by mouth once a day 2)  Budeprion Xl 300 Mg Tb24 (Bupropion hcl) 3)  Levothyroxine Sodium 100 Mcg Tabs (Levothyroxine sodium) 4)  Lisinopril 10 Mg Tabs (Lisinopril) .Marland Kitchen.. 1 by mouth qd 5)  Lipitor 20 Mg Tabs (Atorvastatin calcium) .Marland Kitchen.. 1 by mouth qd 6)  Warfarin Sodium 6 Mg Tabs (Warfarin sodium) 7)  Albuterol 90 Mcg/act Aers (Albuterol) .Marland Kitchen.. 1-2 puffs q4-6h prn 8)  Diazepam 2 Mg Tabs (Diazepam) .... 1/2-1 by mouth three times a day prn 9)  Azmacort 75 Mcg/act Aers (Triamcinolone acetonide) .... 3 puffs bid 10)  Boniva 150 Mg Tabs (Ibandronate sodium) .Marland Kitchen.. 1 every month  Other Orders: Protime ([id number])   Patient Instructions: 1)  coumadin: no change 2)  check coumadin in 4 weeks 3)  Office visit , fasting, 3 months 4)  take boniva  once a month instead of fosamax. Same precautions  Prescriptions: BONIVA 150 MG  TABS (IBANDRONATE SODIUM) 1 every month  #1 x 12   Entered and Authorized by:   Joani Cosma E. Santiago Graf MD   Signed by:   Nolon Rod. Opal Dinning MD on 07/22/2007   Method used:   Print then Give to Patient   RxID:   [id number]  ]  ANTICOAGULATION RECORD PREVIOUS REGIMEN & LAB RESULTS Anticoagulation Diagnosis:  PE on  06/29/2007  Previous INR:  2.0 on  06/29/2007 Previous Coumadin Dose(mg):  6mg  on  02/07/2007 Previous Regimen:  1 tablet qd except 1/2 tab m,w,f on  02/07/2007 Previous Coagulation Comments:  FORGOT DOSE 06/28/07 on  06/29/2007  NEW REGIMEN & LAB RESULTS Anticoag. Dx: PE Current INR: 2.5 Regimen: 1 tablet qd except 1/2 tab m,w,f  (no change) Coagulation Comments: no change Provider: Willow Ora, MD      Repeat testing in: 4 weeks MEDICATIONS MACROBID 100 MG CAPS (NITROFURANTOIN MONOHYD MACRO) Take 1 capsule by mouth once a day BUDEPRION XL 300 MG TB24 (BUPROPION HCL)  LEVOTHYROXINE  SODIUM 100 MCG TABS (LEVOTHYROXINE SODIUM)  LISINOPRIL 10 MG TABS (LISINOPRIL) 1 by mouth qd LIPITOR 20 MG TABS (ATORVASTATIN CALCIUM) 1 by mouth qd WARFARIN SODIUM 6 MG TABS (WARFARIN SODIUM)  ALBUTEROL 90 MCG/ACT AERS (ALBUTEROL) 1-2 puffs q4-6h prn DIAZEPAM 2 MG  TABS (DIAZEPAM) 1/2-1 by mouth three times a day prn AZMACORT 75 MCG/ACT  AERS (TRIAMCINOLONE ACETONIDE) 3 puffs bid BONIVA 150 MG  TABS (IBANDRONATE SODIUM) 1 every month

## 2010-10-07 NOTE — Medication Information (Signed)
Summary: Dawn Morrow  Anticoagulant Therapy  Managed by: Cloyde Reams, RN, BSN Supervising MD: Ladona Ridgel MD, Sharlot Gowda Indication 1: Deep Vein Thrombosis - Leg (ICD-451.1) Indication 2: CVA-stroke (ICD-436) Lab Used: [facility] Site: [facility] INR POC 3.0 INR RANGE 2 - 3  Dietary changes: no    Health status changes: yes       Details: Sick with flu like symptoms last week.  Decr appetite.  Bleeding/hemorrhagic complications: no    Recent/future hospitalizations: no    Any changes in medication regimen? no    Recent/future dental: no  Any missed doses?: no       Is patient compliant with meds? yes       Allergies (verified): 1)  ! Pcn 2)  ! Sulfa  Anticoagulation Management History:      The patient is taking warfarin and comes in today for a routine follow up visit.  Positive risk factors for bleeding include an age of 73 years or older and presence of serious comorbidities.  The bleeding index is 'intermediate risk'.  Positive CHADS2 values include History of HTN and History of Diabetes.  Negative CHADS2 values include Age > 73 years old.  The start date was 05/16/2007.  Her last INR was 8.5 RATIO.  Anticoagulation responsible provider: Ladona Ridgel MD, Sharlot Gowda.  INR POC: 3.0.  Cuvette Lot#: [id number].  Exp: 12/2010.    Anticoagulation Management Assessment/Plan:      The patient's current anticoagulation dose is Coumadin 6 mg tabs: Take as directed by coumadin clinic..  The target INR is 2 - 3.  The next INR is due 10/17/2009.  Anticoagulation instructions were given to patient.  Results were reviewed/authorized by Cloyde Reams, RN, BSN.  She was notified by Cloyde Reams RN.         Prior Anticoagulation Instructions: INR 3.7  No pill tomorrow. Then 0.5 tab (3 mg) everday except Fridays. Take 1 tab (6 mg) on Fridays.   Recheck in 3 weeks      Current Anticoagulation Instructions: INR 3.0  Continue on same dosage 1/2 tablet daily except 1 tablet on Fridays.  Recheck in 4  weeks.

## 2010-10-07 NOTE — Medication Information (Signed)
Summary: Dawn Morrow  Anticoagulant Therapy  Managed by: Bethena Midget, RN, BSN Supervising MD: Eden Emms MD, Theron Arista Indication 1: Deep Vein Thrombosis - Leg (ICD-451.1) Indication 2: CVA-stroke (ICD-436) Lab Used: [facility] Site: [facility] INR POC 1.4 INR RANGE 2 - 3  Dietary changes: no    Health status changes: no    Bleeding/hemorrhagic complications: no    Recent/future hospitalizations: no    Any changes in medication regimen? no    Recent/future dental: no  Any missed doses?: no       Is patient compliant with meds? yes       Allergies: 1)  ! Pcn 2)  ! Sulfa  Anticoagulation Management History:      The patient is taking warfarin and comes in today for a routine follow up visit.  Positive risk factors for bleeding include an age of 74 years or older and presence of serious comorbidities.  The bleeding index is 'intermediate risk'.  Positive CHADS2 values include History of HTN and History of Diabetes.  Negative CHADS2 values include Age > 27 years old.  The start date was 05/16/2007.  Her last INR was 8.5 RATIO.  Anticoagulation responsible provider: Eden Emms MD, Theron Arista.  INR POC: 1.4.  Cuvette Lot#: [id number].  Exp: 01/2011.    Anticoagulation Management Assessment/Plan:      The patient's current anticoagulation dose is Coumadin 6 mg tabs: Take as directed by coumadin clinic..  The target INR is 2 - 3.  The next INR is due 12/10/2009.  Anticoagulation instructions were given to patient.  Results were reviewed/authorized by Bethena Midget, RN, BSN.  She was notified by Bethena Midget, RN, BSN.         Prior Anticoagulation Instructions: Take a total of 1 tablet today then continue as before: half tablet daily except 1 tablet on Fri.   Current Anticoagulation Instructions: INR 1.4 Today take extra 1/2 pill then change dose to 1/2 pill everyday except 1 pill on Tuesdays and Fridays. Recheck in 2 weeks.

## 2010-10-07 NOTE — Progress Notes (Signed)
Summary: [facility] only  Phone Note Call from Patient   Caller: Patient Summary of Call: Pt calling staten she can't make her coumdin appt and like to know how important this is. please [phone] only want to speak to [facility] Initial call taken by: Freddy Jaksch,  February 13, 2008 12:09 PM  Follow-up for Phone Call        patient doesn't have any transportation until 6/11..... appt scheduled...............Marland KitchenShary Decamp  February 13, 2008 1:17 PM

## 2010-10-07 NOTE — Medication Information (Signed)
Summary: rov/sp  Anticoagulant Therapy  Managed by: Bethena Midget, RN, BSN Supervising MD: Jens Som MD, Arlys John Indication 1: Deep Vein Thrombosis - Leg (ICD-451.1) Indication 2: CVA-stroke (ICD-436) Lab Used: [facility] Site: [facility] INR POC 2.4 INR RANGE 2 - 3  Dietary changes: no    Health status changes: no    Bleeding/hemorrhagic complications: no    Recent/future hospitalizations: no    Any changes in medication regimen? yes       Details: Lipitor and Vitamin D added Wednesday  Recent/future dental: no  Any missed doses?: no       Is patient compliant with meds? yes       Current Medications (verified): 1)  Budeprion Xl 300 Mg Tb24 (Bupropion Hcl) .Marland Kitchen.. 1 By Mouth Once Daily 2)  Levothyroxine Sodium 100 Mcg Tabs (Levothyroxine Sodium) .Marland Kitchen.. 1 By Mouth Once Daily 3)  Lisinopril 10 Mg Tabs (Lisinopril) .Marland Kitchen.. 1 By Mouth Qd 4)  Boniva 150 Mg  Tabs (Ibandronate Sodium) .Marland Kitchen.. 1 Every Month 5)  Qvar 80 Mcg/act  Aers (Beclomethasone Dipropionate) .... 2 Puffs Bid 6)  Cleocin-T 1 % Lotn (Clindamycin Phosphate) .... Apply To The Face Two Times A Day 7)  Coumadin 6 Mg Tabs (Warfarin Sodium) .... Sunday - 0.5 Tab, Monday - 0.5 Tab, Tuesday - 0.5 Tab, Wednesday - 1 Tab, Thursday - 1 Tab, Friday - 1 Tab, Saturday - 0.5 Tab 8)  Ergocalciferol 50000 Unit Caps (Ergocalciferol) .Marland Kitchen.. 1 By Mouth Qwk X 12 Weeks Then Start Otc Vit D 1200 Units Daily 9)  Lipitor 20 Mg Tabs (Atorvastatin Calcium) .Marland Kitchen.. 1 By Mouth At Bedtime  Allergies (verified): 1)  ! Pcn 2)  ! Sulfa  Anticoagulation Management History:      The patient is taking warfarin and comes in today for a routine follow up visit.  Positive risk factors for bleeding include an age of 73 years or older.  The bleeding index is 'intermediate risk'.  Positive CHADS2 values include History of HTN.  Negative CHADS2 values include Age > 73 years old.  The start date was 05/16/2007.  Her last INR was 8.5 RATIO.  Anticoagulation responsible  provider: Jens Som MD, Arlys John.  INR POC: 2.4.  Cuvette Lot#: [id number].  Exp: 03/2010.    Anticoagulation Management Assessment/Plan:      The patient's current anticoagulation dose is Coumadin 6 mg tabs: Sunday - 0.5 tab, Monday - 0.5 tab, Tuesday - 0.5 tab, Wednesday - 1 tab, Thursday - 1 tab, Friday - 1 tab, Saturday - 0.5 tab.  The target INR is 2 - 3.  The next INR is due 04/12/2009.  Anticoagulation instructions were given to patient.  Results were reviewed/authorized by Bethena Midget, RN, BSN.  She was notified by Bethena Midget, RN, BSN.         Prior Anticoagulation Instructions: INR 2.3 The patient is to continue with the same dose of coumadin.  This dosage includes:  Coumadin 6 mg tabs: Sunday - 0.5 tab, Monday - 0.5 tab, Tuesday - 0.5 tab, Wednesday - 1 tab, Thursday - 1 tab, Friday - 1 tab, Saturday - 0.5 tab.    Current Anticoagulation Instructions: same dose

## 2010-10-07 NOTE — Procedures (Signed)
Summary: Gastroenterology--COLONOSCOPY  Gastroenterology--COLONOSCOPY   Imported By: Freddy Jaksch 03/30/2007 16:14:21  _____________________________________________________________________  External Attachment:    Type:   Image     Comment:   COLONOSCOPY

## 2010-10-07 NOTE — Assessment & Plan Note (Signed)
Summary: PT CHECK--PH   Nurse Visit   Vital Signs:  Patient Profile:   75 Years Old Female CC:      pt check Height:     65 inches Weight:      187.8 pounds BP sitting:   112 / 80  Vitals Entered By: Shary Decamp (March 22, 2008 11:34 AM)                 Prior Medications: MACROBID 100 MG CAPS (NITROFURANTOIN MONOHYD MACRO) Take 1 capsule by mouth once a day BUDEPRION XL 300 MG TB24 (BUPROPION HCL)  LEVOTHYROXINE SODIUM 100 MCG TABS (LEVOTHYROXINE SODIUM)  LISINOPRIL 10 MG TABS (LISINOPRIL) 1 by mouth qd LIPITOR 20 MG TABS (ATORVASTATIN CALCIUM) 1 by mouth qd WARFARIN SODIUM 6 MG TABS (WARFARIN SODIUM)  ALBUTEROL 90 MCG/ACT AERS (ALBUTEROL) 1-2 puffs q4-6h prn DIAZEPAM 2 MG  TABS (DIAZEPAM) 1/2-1 by mouth three times a day prn BONIVA 150 MG  TABS (IBANDRONATE SODIUM) 1 every month QVAR 80 MCG/ACT  AERS (BECLOMETHASONE DIPROPIONATE) 2 puffs bid TRAMADOL HCL 50 MG  TABS (TRAMADOL HCL) 1 by mouth qid as needed pain Current Allergies: ! PCN ! SULFA Laboratory Results   Blood Tests     PT: 17.7 s   (Normal Range: 10.6-13.4)  INR: 2.1   (Normal Range: 0.88-1.12   Therap INR: 2.0-3.5) Comments: CURRENT DOSE: 6mg  tablets 1/2 tablet daily except 1 on tues & fri no change  recheck in 4 weeks ................Marland KitchenShary Decamp  March 22, 2008 11:36 AM Rhyatt Muska E. Nickola Lenig MD  March 23, 2008 12:49 PM       Orders Added: 1)  Est. Patient Level I [95188] 2)  Protime Ila.[email or web address]    ]

## 2010-10-07 NOTE — Progress Notes (Signed)
Summary: pt/inr results  Phone Note Other Incoming Call back at [phone]   [facility]: [facility] Summary of Call: calling with PT/INR results   Follow-up for Phone Call        left detailed msg information forwarded to coumadin clinic .Marland KitchenMarland KitchenMarland KitchenDoristine Devoid CMA  May 01, 2010 4:12 PM   Additional Follow-up for Phone Call Additional follow up Details #1::        INR Noted see coumadin note in EMR.  Additional Follow-up by: Cloyde Reams RN,  May 01, 2010 4:15 PM     Laboratory Results   Blood Tests     PT: 29.3 s   (Normal Range: 10.6-13.4)  INR: 2.4   (Normal Range: 0.88-1.12   Therap INR: 2.0-3.5) Comments: current dose: 3mg  daily except 6mg  on Tues. & Fri.

## 2010-10-07 NOTE — Progress Notes (Signed)
Summary: no RF w.o OV   FYI  UNABLE TO GET PT/INR  Phone Note From Other Clinic Call back at [phone]   Caller: Maureen Ralphs ( home health) Summary of Call: VM left stating that pt finally return call today however pt states that she will only be in today until about 1 pm. Nurse instructed pt that she is [place] the [place] side of town and would be unable to make pt time frame today. Maureen Ralphs would like to inform Dr Drue Novel that she tried 3x last week and once this weekend to see the pt and has not had any luck. Per Maureen Ralphs she instructed pt to contact our office to get PT/INR set since they are unable to obtain it................Marland KitchenFelecia Deloach CMA  June 02, 2010 10:14 AM   Left message to call office. Pt needs appt to get PT/INR check asap....................Marland KitchenFelecia Deloach CMA  June 02, 2010 10:14 AM  left message to call office with pt [phone]....................Marland KitchenFelecia Deloach CMA  June 02, 2010 4:11 PM   Follow-up for Phone Call        Per boyfriend I called patient at [phone]. On 1st attempt the patient answered and then ung up. On 2nd attempt the patient did not answer, Left message on voicemail to call back to office and schedule lab visit. Follow-up by: Lucious Groves CMA,  June 02, 2010 4:54 PM  Additional Follow-up for Phone Call Additional follow up Details #1::        pt has pending f/u appt on 06-06-10 with dr Drue Novel............Marland KitchenFelecia Deloach CMA  June 03, 2010 9:54 AM     Additional Follow-up for Phone Call Additional follow up Details #2::    no refill on meds unless she has a f.u Follow-up by: Elita Quick E. Paz MD,  June 03, 2010 11:41 AM

## 2010-10-07 NOTE — Medication Information (Signed)
Summary: rov/sl   Anticoagulant Therapy  Managed by: Reina Fuse, PharmD Supervising MD: Clifton James MD, Cristal Deer Indication 1: Deep Vein Thrombosis - Leg (ICD-451.1) Indication 2: CVA-stroke (ICD-436) Lab Used: [facility] Site: [facility] INR POC 2.8 INR RANGE 2 - 3  Dietary changes: no    Health status changes: no    Bleeding/hemorrhagic complications: no    Recent/future hospitalizations: no    Any changes in medication regimen? no    Recent/future dental: no  Any missed doses?: no       Is patient compliant with meds? yes       Allergies: 1)  ! Pcn 2)  ! Sulfa  Anticoagulation Management History:      The patient is taking warfarin and comes in today for a routine follow up visit.  Positive risk factors for bleeding include an age of 73 years or older and presence of serious comorbidities.  The bleeding index is 'intermediate risk'.  Positive CHADS2 values include History of HTN and History of Diabetes.  Negative CHADS2 values include Age > 73 years old.  The start date was 05/16/2007.  Her last INR was 1.7.  Anticoagulation responsible provider: Clifton James MD, Cristal Deer.  INR POC: 2.8.  Cuvette Lot#: [id number].  Exp: 08/08/2011.    Anticoagulation Management Assessment/Plan:      The patient's current anticoagulation dose is Coumadin 6 mg tabs: Take as directed by coumadin clinic..  The target INR is 2 - 3.  The next INR is due 08/21/2010.  Anticoagulation instructions were given to patient.  Results were reviewed/authorized by Reina Fuse, PharmD.         Prior Anticoagulation Instructions: INR 2.7  Continue taking Coumadin 0.5 tab (3 mg) on Sun, Tues, Thur, Sat and Coumadin 1 tab (6 mg) on Mon, Wed, Fri. Return to clinic in 4 weeks.   Current Anticoagulation Instructions: INR 2.8 Continue taking 1 tablet on monday, wednesday, and friday. And a half tablet all other days. Recheck in 4 weeks.

## 2010-10-07 NOTE — Assessment & Plan Note (Signed)
Summary: rov/pt check/swh   Vital Signs:  Patient Profile:   75 Years Old Female Weight:      180.4 pounds Pulse (ortho):   72 / minute BP sitting:   130 / 76  (right arm)  Pt. in pain?   yes    Location:   chest    Type:       burning  Vitals Entered By: Doristine Devoid (March 24, 2007 10:25 AM)                Chief Complaint:  Felt heartburn in chest felt like a fist hitting her x2-3weeks. Fells better now.  History of Present Illness:  long history of heartburn however she  is here today because she also developed chest pain.  She had few episodes of chest pain described as a fist in the middle of the chest, lasted less than one minute, no radiation, no associated shortness of breath or diaphoresis.  Symptoms were not trigger  by food or exertion, she did feel slightly dizzy.  Wonders if this is related with heartburn or something else.    Past Medical History:    Reviewed history from 09/11/2006 and no changes required:       Asthma       COPD       Depression       DVT, hx of       GERD       Hypertension       Hyperthyroidism       Pulmonary embolism, hx of    Social History:    Single    Review of Systems       no fever, cough, lower extremity edema. She has dysphasia and sometimes odynophagia. No abdominal pain.   Physical Exam  General:     alert and well-developed.   Lungs:     normal respiratory effort, no intercostal retractions, no accessory muscle use, and normal breath sounds.   Heart:     normal rate, regular rhythm, and no murmur.   Abdomen:     soft, non-tender, no hepatomegaly, and no splenomegaly.   Extremities:     no edema Neurologic:     a baseline    Impression & Recommendations:  Problem # 1:  CHEST PAIN (ICD-786.50) somehow atypical  ("lasted less than a minute") EKGs shows low QRS voltages, this is different from previous EKG.  No acute changes. On chart review she had a negative stress test in 2005. Referral to  cardiology even though the pain may be related to GERD. Orders: TLB-CBC Platelet - w/Differential (85025-CBCD) EKG w/ Interpretation (93000) Venipuncture ([address]) CXR- 2view (CXR) Cardiology Referral (Cardiology)   Problem # 2:  GERD (ICD-530.81) she is  symptomatic information about GERD provided. Start Prilosec over-the-counter 20 mg one before breakfast. She had an EGD on November 2006, they found the high at the hernia and gastritis.  Other Orders: Protime (60454UJ) TLB-BMP (Basic Metabolic Panel-BMET) (80048-METABOL) TLB-A1C / Hgb A1C (Glycohemoglobin) (83036-A1C)   Patient Instructions: 1)  will refer you to see the cardiologist. 2)  Take Prilosec 20 mg over-the-counter once everyday before breakfast. 3)  Your Coumadin is fine however because were starting a new medication, please come back in two weeks for a recheck on your Coumadin.  4)  please read carefully the information about reflux 5)  Please schedule a follow-up appointment in 4 months.        ANTICOAGULATION RECORD PREVIOUS REGIMEN & LAB RESULTS Anticoagulation  Diagnosis:  PE on  03/09/2007  Previous INR:  3.3 on  03/09/2007 Previous Coumadin Dose(mg):  6mg  on  02/07/2007 Previous Regimen:  1 tablet qd except 1/2 tab m,w,f on  02/07/2007  NEW REGIMEN & LAB RESULTS Current INR: 2.6 Regimen: 1 tablet qd except 1/2 tab m,w,f  (no change)  MEDICATIONS MACROBID 100 MG CAPS (NITROFURANTOIN MONOHYD MACRO) Take 1 capsule by mouth once a day BUDEPRION XL 300 MG TB24 (BUPROPION HCL)  LEVOTHYROXINE SODIUM 100 MCG TABS (LEVOTHYROXINE SODIUM)  FOSAMAX 70 MG TABS (ALENDRONATE SODIUM)  LISINOPRIL 10 MG TABS (LISINOPRIL) 1 by mouth qd LIPITOR 20 MG TABS (ATORVASTATIN CALCIUM)  WARFARIN SODIUM 6 MG TABS (WARFARIN SODIUM)  ALBUTEROL 90 MCG/ACT AERS (ALBUTEROL)  DIAZEPAM 2 MG  TABS (DIAZEPAM) 1/2-1 by mouth three times a day prn

## 2010-10-07 NOTE — Medication Information (Signed)
Summary: Coumadin Clinic  Anticoagulant Therapy  Managed by: Cloyde Reams, RN, BSN Supervising MD: Graciela Husbands MD, Viviann Spare Indication 1: Deep Vein Thrombosis - Leg (ICD-451.1) Indication 2: CVA-stroke (ICD-436) Lab Used: [facility] [place] Site: [facility] PT 29.3 INR POC 2.4 INR RANGE 2 - 3  Dietary changes: no     Bleeding/hemorrhagic complications: no     Any changes in medication regimen? no     Any missed doses?: no       Is patient compliant with meds? yes       Allergies: 1)  ! Pcn 2)  ! Sulfa  Anticoagulation Management History:      Her anticoagulation is being managed by telephone today.  Positive risk factors for bleeding include an age of 73 years or older and presence of serious comorbidities.  The bleeding index is 'intermediate risk'.  Positive CHADS2 values include History of HTN and History of Diabetes.  Negative CHADS2 values include Age > 73 years old.  The start date was 05/16/2007.  Her last INR was 2.4.  Prothrombin time is 29.3.  Anticoagulation responsible provider: Graciela Husbands MD, Viviann Spare.  INR POC: 2.4.  Exp: 06/08/2011.    Anticoagulation Management Assessment/Plan:      The patient's current anticoagulation dose is Coumadin 6 mg tabs: Take as directed by coumadin clinic..  The target INR is 2 - 3.  The next INR is due 05/15/2010.  Anticoagulation instructions were given to patient.  Results were reviewed/authorized by Cloyde Reams, RN, BSN.  She was notified by Cloyde Reams RN.         Prior Anticoagulation Instructions: INR 3.1  Spoke with pt.  Skip today's dose of Coumadin then continue same dose of 1/2 tablet every day except 1 tablet on Tuesday and Friday. Gave orders to [place] with [facility].   Current Anticoagulation Instructions: INR 2.4  Attempted to call pt with results.  LM with family member to call back for results. Cloyde Reams RN  May 01, 2010 4:22 PM   Left message with female family member for pt to call for dosing. Bethena Midget  RN  May 02, 2010 3:45 PM Spoke with pt advised to continue on same dosage 3mg  daily except 6mg  on Tuesdays and Fridays.  Recheck in 2 weeks.  Called [facility] and gave verbal orders to Lurena Joiner to redraw on 05/15/10.

## 2010-10-07 NOTE — Progress Notes (Signed)
Summary: FYI on PT d/c  Phone Note Other Incoming   Caller: Trey Paula, PT from [facility] Summary of Call: Trey Paula called to let us know that Mrs. Nottingham was being dc/'d from her PT services today. This was just an [place]. Any questions you can reach [place] @ [phone].  Initial call taken by: Harold Barban,  May 14, 2010 12:43 PM

## 2010-10-07 NOTE — Assessment & Plan Note (Signed)
Summary: FOLLOW //UP//PH   Vital Signs:  Patient profile:   75 year old female Weight:      200 pounds Pulse rate:   99 / minute Pulse rhythm:   regular BP sitting:   132 / 84  (left arm) Cuff size:   large  Vitals Entered By: [name] CMA (June 06, 2010 3:49 PM) CC: f/u appt, PT check Comments [facility]  Refill on Antivert   History of Present Illness: here with her sister there was some confusion about her INR followup Needs a flu shot  ROS Good medication compliance according to the patient sister states that the pt is somehow confused about her medication, usually her boyfriend helps her with that No recent ambulatory CBGs Still has dizziness from time to time, Antivert seems to help  Allergies: 1)  ! Pcn 2)  ! Sulfa  Past History:  Past Medical History: Reviewed history from 01/27/2010 and no changes required. AODM Asthma, COPD Depression DVT, hx of x multiple, on coumadin  Pulmonary embolism, hx of  x 2  GERD Hypertension increased homocysreine levels Osteopenia hypothyroidism Hyperlipidemia  Past Surgical History: Reviewed history from 05/16/2008 and no changes required. Appendectomy Cholecystectomy Lumbar fusion,  in the 90s Tubal ligation Tonsillectomy  Social History: Reviewed history from 02/27/2009 and no changes required. Single has a boyfriend 2 kids lives by self doesn't drive sister handles all her financial affairs  tobacco--no currently a smoker ETOH-- socially   Physical Exam  General:  alert and well-developed.  alert and well-developed.   Lungs:  normal respiratory effort, no intercostal retractions, and no accessory muscle use.  slightly decreased breath sounds Heart:  normal rate, regular rhythm, no murmur, and no gallop.   Psych:  Oriented X3, good eye contact, and not depressed appearing.  slightly anxious, has a difficult time remembering her medications     Impression &  Recommendations:  Problem # 1:  DIZZINESS (ICD-780.4) still requiring Antivert, refill  Her updated medication list for this problem includes:    Antivert 25 Mg Tabs (Meclizine hcl) .Marland Kitchen... 1 by mouth daily at bedtime.  Problem # 2:  COUMADIN THERAPY (ICD-V58.61) the patient has been follow up at the Coumadin clinic for  longtime, however   lately there was some confusion about who is in charge...mostly because the home health agency was calling us  instead of the Coumadin clinic for advice. today her INR was checked and is slightly low, I am taking the liberty to adjust her dosing  but at the same time I am asking her to followup at the Coumadin clinic as usual.  See instructions.    Orders: Protime ([id number])  Problem # 3:  DIABETES MELLITUS, TYPE II (ICD-250.00) well-controlled, check her CBGs rarely. She is not on any medication for diabetes. To facilitate her care, I am asking the patient not to check CBGs at this point Her updated medication list for this problem includes:    Lisinopril 10 Mg Tabs (Lisinopril) .Marland Kitchen... 1 by mouth qd  Labs Reviewed: Creat: 1.2 (10/04/2009)     Last Eye Exam: normal (12/05/2009) Reviewed HgBA1c results: 6.2 (01/27/2010)  6.4 (07/09/2009)  Complete Medication List: 1)  Budeprion Xl 300 Mg Tb24 (Bupropion hcl) .Marland Kitchen.. 1 by mouth once daily 2)  Levothroid 137 Mcg Tabs (Levothyroxine sodium) .Marland Kitchen.. 1 by mouth daily 3)  Lisinopril 10 Mg Tabs (Lisinopril) .Marland Kitchen.. 1 by mouth qd 4)  Actonel 150 Mg Tabs (Risedronate sodium) .Marland Kitchen.. 1 by mouth one time a month -  stop boniva 5)  Qvar 40 Mcg/act Aers (Beclomethasone dipropionate) .... Bid 6)  Coumadin 6 Mg Tabs (Warfarin sodium) .... Take as directed by coumadin clinic. 7)  Lipitor 20 Mg Tabs (Atorvastatin calcium) .Marland Kitchen.. 1 by mouth at bedtime 8)  One Touch Delica Lancets Misc (Lancets) .... Checks blood sugar 1x day, dx 250.00 9)  Prodigy Blood Glucose Test Strp (Glucose blood) .... Check bs 1x daily, dx 250.00 10)   Ventolin Hfa 108 (90 Base) Mcg/act Aers (Albuterol sulfate) .... 2 puffs every 4 hours as needed for shortness of breath or wheezing 11)  Coreg 12.5 Mg Tabs (Carvedilol) .... Take 1 by mouth daily 12)  Antivert 25 Mg Tabs (Meclizine hcl) .Marland Kitchen.. 1 by mouth daily at bedtime. 13)  Prodigy Preferred Monitor W/device Kit (Blood glucose monitoring suppl) .... As directed. 14)  Prodigy Lancing Device Misc (Lancet devices) .... Two times a day 15)  Prodigy Blood Glucose Test Strp (Glucose blood) .... Two times a day 16)  Vitamin D3 2000 Unit Caps (Cholecalciferol) .... Qd  Other Orders: Flu Vaccine 42yrs + MEDICARE PATIENTS (Z6109) Administration Flu vaccine - MCR (U0454)  Patient Instructions: 1)  for now, you don't need to check your blood sugars 2)  new dose Coumadin 6 mg tablet 3)  One by mouth Monday Wednesday and Friday 4)  1/2  by mouth the rest of the days 5)   Check your INR in 2 weeks at  the Coumadin clinic, be sure you call them and make an appointment. Their phone number is [phone] 6)  Come back here in 2 months 7)    Prescriptions: ANTIVERT 25 MG TABS (MECLIZINE HCL) 1 by mouth daily at bedtime.  #30 x 3   Entered and Authorized by:   Nolon Rod. Paz MD   Signed by:   Nolon Rod. Paz MD on 06/06/2010   Method used:   Print then Give to Patient   RxID:   [id number]   Laboratory Results   Blood Tests      INR: 1.7   (Normal Range: 0.88-1.12   Therap INR: 2.0-3.5) Comments: Has 6mg  tabs Takes 6mg  Tues, Fri All other days takes 1/2 tab 27 mg weekly ---------------------------------- new dose, 6 mg tablet One by mouth Monday Wednesday and Friday 1/2  by mouth the rest of the days 30 mg a week Check your INR in 2 weeks in the Coumadin clinic Jose E. Paz MD  June 06, 2010 4:32 PM     Flu Vaccine Consent Questions     Do you have a history of severe allergic reactions to this vaccine? no    Any prior history of allergic reactions to egg and/or gelatin? no    Do you  have a sensitivity to the preservative Thimersol? no    Do you have a past history of Guillan-Barre Syndrome? no    Do you currently have an acute febrile illness? no    Have you ever had a severe reaction to latex? no    Vaccine information given and explained to patient? yes    Are you currently pregnant? no    Lot Number:[id number]   Exp Date:03/07/2011   Site Given right Deltoid IM ays takes 1/2 tab    .lbmedflu

## 2010-10-07 NOTE — Medication Information (Signed)
Summary: ccr   Anticoagulant Therapy  Managed by: Reina Fuse, PharmD Supervising MD: Tenny Craw MD, Gunnar Fusi Indication 1: Deep Vein Thrombosis - Leg (ICD-451.1) Indication 2: CVA-stroke (ICD-436) Lab Used: [facility] [place] Site: [facility] INR POC 2.7 INR RANGE 2 - 3  Dietary changes: no    Health status changes: no    Bleeding/hemorrhagic complications: no    Recent/future hospitalizations: no    Any changes in medication regimen? no    Recent/future dental: no  Any missed doses?: no       Is patient compliant with meds? yes      Comments: Pt has been being followed elsewhere on current dose.   Allergies: 1)  ! Pcn 2)  ! Sulfa  Anticoagulation Management History:      The patient is taking warfarin and comes in today for a routine follow up visit.  Positive risk factors for bleeding include an age of 73 years or older and presence of serious comorbidities.  The bleeding index is 'intermediate risk'.  Positive CHADS2 values include History of HTN and History of Diabetes.  Negative CHADS2 values include Age > 38 years old.  The start date was 05/16/2007.  Her last INR was 1.7.  Anticoagulation responsible provider: Tenny Craw MD, Gunnar Fusi.  INR POC: 2.7.  Cuvette Lot#: [id number].  Exp: 06/08/2011.    Anticoagulation Management Assessment/Plan:      The patient's current anticoagulation dose is Coumadin 6 mg tabs: Take as directed by coumadin clinic..  The target INR is 2 - 3.  The next INR is due 07/24/2010.  Anticoagulation instructions were given to patient.  Results were reviewed/authorized by Reina Fuse, PharmD.  She was notified by Reina Fuse PharmD.         Prior Anticoagulation Instructions: INR 2.4  Attempted to call pt with results.  LM with family member to call back for results. Cloyde Reams RN  May 01, 2010 4:22 PM   Left message with female family member for pt to call for dosing. Bethena Midget RN  May 02, 2010 3:45 PM Spoke with pt advised to continue on same dosage  3mg  daily except 6mg  on Tuesdays and Fridays.  Recheck in 2 weeks.  Called [facility] and gave verbal orders to Lurena Joiner to redraw on 05/15/10.   Current Anticoagulation Instructions: INR 2.7  Continue taking Coumadin 0.5 tab (3 mg) on Sun, Tues, Thur, Sat and Coumadin 1 tab (6 mg) on Mon, Wed, Fri. Return to clinic in 4 weeks.

## 2010-10-07 NOTE — Letter (Signed)
Summary: Primary Care Consult Scheduled Letter  [facility] [place]  [address] [place], [place] [address]   Phone: [phone]  Fax: [phone]      08/10/2008 MRN: [id number]  Dawn Morrow [address] [place], [place]  [address]    Dear Dawn Morrow,      We have scheduled an appointment for you.  At the recommendation of Dr.Paz, we have scheduled you a consult with ___[facility]__ on ___12/10/09__ at __2:15__.  Their address is_____[address]____. The office phone number is [phone].  If this appointment day and time is not convenient for you, please feel free to call the office of the doctor you are being referred to at the number listed above and reschedule the appointment.     It is important for you to keep your scheduled appointments. We are here to make sure you are given good patient care. If you have questions or you have made changes to your appointment, please notify us at  [phone]  , ask for    Tiffany  .    Thank you,  Patient Care Coordinator [place] [facility]

## 2010-10-07 NOTE — Assessment & Plan Note (Signed)
Summary: 4 MONTH FOLLOWUP///SPH   Vital Signs:  Patient profile:   75 year old female Height:      65 inches Weight:      194.6 pounds BMI:     32.50 Pulse rate:   64 / minute BP sitting:   130 / 70  Vitals Entered By: Shary Decamp (Jan 27, 2010 1:54 PM) CC: rov, panic attack 6 weeks ago   History of Present Illness: panick attacks x 2, the last was severe. Used to have them before; symptoms related to relatiuonship w/  children ; no depression  AODM-- no recemt  ambulatory CBGs   Asthma, COPD--  ventolin was recently prescribed, doing well  Hypertension-- no recent ambulatory BPs    Osteopenia-- switched from boniva to actonel, no s/e      Current Medications (verified): 1)  Budeprion Xl 300 Mg Tb24 (Bupropion Hcl) .Marland Kitchen.. 1 By Mouth Once Daily 2)  Levothyroxine Sodium 100 Mcg Tabs (Levothyroxine Sodium) .Marland Kitchen.. 1 By Mouth Once Daily 3)  Lisinopril 10 Mg Tabs (Lisinopril) .Marland Kitchen.. 1 By Mouth Qd 4)  Actonel 150 Mg Tabs (Risedronate Sodium) .Marland Kitchen.. 1 By Mouth One Time A Month - Stop Boniva 5)  Qvar 80 Mcg/act  Aers (Beclomethasone Dipropionate) .... 2 Puffs Bid 6)  Coumadin 6 Mg Tabs (Warfarin Sodium) .... Take As Directed By Coumadin Clinic. 7)  Ergocalciferol 50000 Unit Caps (Ergocalciferol) .Marland Kitchen.. 1 By Mouth Qwk X 12 Weeks Then Start Otc Vit D 1200 Units Daily 8)  Lipitor 20 Mg Tabs (Atorvastatin Calcium) .Marland Kitchen.. 1 By Mouth At Bedtime 9)  Triamcinoline Cream 0.1% .... Apply For Rash On Face Per Pt 10)  One Touch Delica Lancets  Misc (Lancets) .... Checks Blood Sugar 1x Day, Dx 250.00 11)  Prodigy Blood Glucose Test  Strp (Glucose Blood) .... Check Bs 1x Daily, Dx 250.00 12)  Ventolin Hfa 108 (90 Base) Mcg/act Aers (Albuterol Sulfate) .... 2 Puffs Every 4 Hours As Needed For Shortness of Breath or Wheezing  Allergies (verified): 1)  ! Pcn 2)  ! Sulfa   Past History:  Past Medical History: AODM Asthma, COPD Depression DVT, hx of x multiple, on coumadin  Pulmonary embolism, hx of   x 2  GERD Hypertension increased homocysreine levels Osteopenia hypothyroidism Hyperlipidemia  Past Surgical History: Reviewed history from 05/16/2008 and no changes required. Appendectomy Cholecystectomy Lumbar fusion,  in the 90s Tubal ligation Tonsillectomy  Social History: Reviewed history from 02/27/2009 and no changes required. Single has a boyfriend 2 kids lives by self doesn't drive sister handles all her financial affairs  tobacco--no currently a smoker ETOH-- socially   Review of Systems CV:  Denies chest pain or discomfort and swelling of feet. GI:  Denies bloody stools, diarrhea, nausea, and vomiting. GU:  Denies dysuria, hematuria, and urinary hesitancy. Endo:  occasionally numbness in the toes  no symptoms of low sugar.  Physical Exam  General:  alert and well-developed.   Lungs:  normal respiratory effort, no intercostal retractions, and no accessory muscle use.  slightly decreased breath sounds Heart:  normal rate, regular rhythm, no murmur, and no gallop.   Pulses:  good bilateral pedal pulses Extremities:  no lower extremity edema  Diabetes Management Exam:    Foot Exam (with socks and/or shoes not present):       Sensory-Pinprick/Light touch:          Left medial foot (L-4): normal          Left dorsal foot (L-5): normal  Left lateral foot (S-1): normal          Right medial foot (L-4): normal          Right dorsal foot (L-5): normal          Right lateral foot (S-1): normal       Sensory-Monofilament:          Left foot: diminished          Right foot: diminished       Inspection:          Left foot: normal          Right foot: normal       Nails:          Left foot: normal          Right foot: normal   Impression & Recommendations:  Problem # 1:  DIABETES MELLITUS, TYPE II (ICD-250.00) diet controlled Some evidence of neuropathy information provided regards feet care and CBG goals  Her updated medication list for this  problem includes:    Lisinopril 10 Mg Tabs (Lisinopril) .Marland Kitchen... 1 by mouth qd  Labs Reviewed: Creat: 1.2 (10/04/2009)     Last Eye Exam: normal (12/05/2009) Reviewed HgBA1c results: 6.4 (07/09/2009)  6.1 (03/06/2009)  Orders: TLB-A1C / Hgb A1C (Glycohemoglobin) (83036-A1C) Venipuncture (17616)  Problem # 2:  HYPOTHYROIDISM (ICD-244.9) due for labs Her updated medication list for this problem includes:    Levothyroxine Sodium 100 Mcg Tabs (Levothyroxine sodium) .Marland Kitchen... 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 1 by mouth qwk x 12 weeks then start otc vit d 1200 units daily Her updated medication list for this problem includes:    Actonel 150 Mg Tabs (Risedronate sodium) .Marland Kitchen... 1 by mouth one time a month - stop boniva  Problem # 4:  HYPERLIPIDEMIA (ICD-272.4) check LFTs Her updated medication list for this problem includes:    Lipitor 20 Mg Tabs (Atorvastatin calcium) .Marland Kitchen... 1 by mouth at bedtime  Orders: TLB-ALT (SGPT) (84460-ALT) TLB-AST (SGOT) (84450-SGOT)  Labs Reviewed: SGOT: 21 (07/09/2009)   SGPT: 19 (07/09/2009)   HDL:50.60 (07/09/2009), 40.30 (03/06/2009)  LDL:98 (07/09/2009), 109 (07/37/1062)  Chol:167 (07/09/2009), 229 (03/06/2009)  Trig:90.0 (07/09/2009), 110.0 (03/06/2009)  Problem # 5:  DEPRESSION (ICD-311) history of depression, also  recent panic attacks x 2 . This was discussed with the patient, she does not feel that she's ready at this time for more medicines. She will  let me know. Her updated medication list for this problem includes:    Budeprion Xl 300 Mg Tb24 (Bupropion hcl) .Marland Kitchen... 1 by mouth once daily  Problem # 6:  HYPERTENSION (ICD-401.9) at goal  Her updated medication list for this problem includes:    Lisinopril 10 Mg Tabs (Lisinopril) .Marland Kitchen... 1 by mouth qd  Orders: TLB-CBC Platelet - w/Differential (85025-CBCD)  BP today: 130/70 Prior BP: 140/60 (01/02/2010)  Labs Reviewed: K+: 3.9 (10/04/2009) Creat: : 1.2 (10/04/2009)   Chol: 167 (07/09/2009)   HDL: 50.60 (07/09/2009)   LDL: 98 (07/09/2009)   TG:  90.0 (07/09/2009)  Complete Medication List: 1)  Budeprion Xl 300 Mg Tb24 (Bupropion hcl) .Marland Kitchen.. 1 by mouth once daily 2)  Levothyroxine Sodium 100 Mcg Tabs (Levothyroxine sodium) .Marland Kitchen.. 1 by mouth once daily 3)  Lisinopril 10 Mg Tabs (Lisinopril) .Marland Kitchen.. 1 by mouth qd 4)  Actonel 150 Mg Tabs (Risedronate sodium) .Marland Kitchen.. 1 by mouth one time a month - stop boniva 5)  Qvar 80 Mcg/act Aers (Beclomethasone dipropionate) .... 2 puffs bid 6)  Coumadin 6 Mg Tabs (Warfarin sodium) .... Take as directed by coumadin clinic. 7)  Lipitor 20 Mg Tabs (Atorvastatin calcium) .Marland Kitchen.. 1 by mouth at bedtime 8)  Triamcinoline Cream 0.1%  .... Apply for rash on face per pt 9)  One Touch Delica Lancets Misc (Lancets) .... Checks blood sugar 1x day, dx 250.00 10)  Prodigy Blood Glucose Test Strp (Glucose blood) .... Check bs 1x daily, dx 250.00 11)  Ventolin Hfa 108 (90 Base) Mcg/act Aers (Albuterol sulfate) .... 2 puffs every 4 hours as needed for shortness of breath or wheezing  Patient Instructions: 1)  Please schedule a follow-up appointment in 4 months .

## 2010-10-07 NOTE — Progress Notes (Signed)
Summary: refill  Phone Note Refill Request Message from:  Fax from Pharmacy on July 08, 2010 2:06 PM  Refills Requested: Medication #1:  QVAR 40 MCG/ACT AERS bid  Medication #2:  LEVOTHROID 137 MCG TABS 1 by mouth daily [facility] - fax [phone]  Initial call taken by: Okey Regal [place],  July 08, 2010 2:06 PM    Prescriptions: QVAR 40 MCG/ACT AERS (BECLOMETHASONE DIPROPIONATE) bid  #1 x 2   Entered by:   [name] CMA   Authorized by:   Nolon Rod. Paz MD   Signed by:   [name] CMA on 07/08/2010   Method used:   Electronically to        [facility]  [phone]* (retail)       [address] [place], [place]  [address]       Ph: [phone] or [phone]       Fax: [phone]   RxID:   [id number] LEVOTHROID 137 MCG TABS (LEVOTHYROXINE SODIUM) 1 by mouth daily  #30 x 2   Entered by:   [name] CMA   Authorized by:   Nolon Rod. Paz MD   Signed by:   [name] CMA on 07/08/2010   Method used:   Electronically to        [facility]  [phone]* (retail)       [address] [place], [place]  [address]       Ph: [phone] or [phone]       Fax: [phone]   RxID:   [id number]

## 2010-10-07 NOTE — Medication Information (Signed)
Summary: rov/ewj  Anticoagulant Therapy  Managed by: Charolotte Eke, PharmD Supervising MD: Daleen Squibb MD, Maisie Fus Indication 1: Deep Vein Thrombosis - Leg (ICD-451.1) Indication 2: CVA-stroke (ICD-436) Lab Used: [facility] Site: [facility] INR POC 1.9 INR RANGE 2 - 3  Dietary changes: no    Health status changes: no    Bleeding/hemorrhagic complications: no    Recent/future hospitalizations: no    Any changes in medication regimen? no    Recent/future dental: no  Any missed doses?: yes     Details: missed one dose yesterday.  Is patient compliant with meds? yes       Current Medications (verified): 1)  Budeprion Xl 300 Mg Tb24 (Bupropion Hcl) .Marland Kitchen.. 1 By Mouth Once Daily 2)  Levothyroxine Sodium 100 Mcg Tabs (Levothyroxine Sodium) .Marland Kitchen.. 1 By Mouth Once Daily 3)  Lisinopril 10 Mg Tabs (Lisinopril) .Marland Kitchen.. 1 By Mouth Qd 4)  Boniva 150 Mg  Tabs (Ibandronate Sodium) .Marland Kitchen.. 1 Every Month 5)  Qvar 80 Mcg/act  Aers (Beclomethasone Dipropionate) .... 2 Puffs Bid 6)  Coumadin 6 Mg Tabs (Warfarin Sodium) .... Take As Directed By Coumadin Clinic. 7)  Ergocalciferol 50000 Unit Caps (Ergocalciferol) .Marland Kitchen.. 1 By Mouth Qwk X 12 Weeks Then Start Otc Vit D 1200 Units Daily 8)  Lipitor 20 Mg Tabs (Atorvastatin Calcium) .Marland Kitchen.. 1 By Mouth At Bedtime 9)  Triamcinoline Cream 0.1% .... Apply For Rash On Face Per Pt 10)  One Touch Delica Lancets  Misc (Lancets) .... Checks Blood Sugar 1x Day, Dx 250.00 11)  One Touch Ultra Test Strips .... Check Blood Sugar 1x Day, Dx 250.00  Allergies (verified): 1)  ! Pcn 2)  ! Sulfa  Anticoagulation Management History:      The patient is taking warfarin and comes in today for a routine follow up visit.  Positive risk factors for bleeding include an age of 24 years or older and presence of serious comorbidities.  The bleeding index is 'intermediate risk'.  Positive CHADS2 values include History of HTN and History of Diabetes.  Negative CHADS2 values include Age > 42 years  old.  The start date was 05/16/2007.  Her last INR was 8.5 RATIO.  Anticoagulation responsible provider: Daleen Squibb MD, Maisie Fus.  INR POC: 1.9.  Cuvette Lot#: [id number].  Exp: 12/2010.    Anticoagulation Management Assessment/Plan:      The patient's current anticoagulation dose is Coumadin 6 mg tabs: Take as directed by coumadin clinic..  The target INR is 2 - 3.  The next INR is due 11/07/2009.  Anticoagulation instructions were given to patient.  Results were reviewed/authorized by Charolotte Eke, PharmD.  She was notified by Charolotte Eke, PharmD.         Prior Anticoagulation Instructions: INR 3.0  Continue on same dosage 1/2 tablet daily except 1 tablet on Fridays.  Recheck in 4 weeks.    Current Anticoagulation Instructions: Take a total of 1 tablet today then continue as before: half tablet daily except 1 tablet on Fri.

## 2010-10-07 NOTE — Assessment & Plan Note (Signed)
Summary: congestion//asthma//lch   Vital Signs:  Patient profile:   75 year old female Height:      65 inches Weight:      193.50 pounds BMI:     32.32 Temp:     97.4 degrees F oral Pulse rate:   87 / minute BP sitting:   140 / 60  Vitals Entered By: Kandice Hams (January 02, 2010 2:49 PM) CC: c/o sick x 1 week cough,congestion wheezing   History of Present Illness: 75 yo woman here today for cough and congestion.  sxs started 1 week ago.  some SOB, wheezing.  cough productive of green sputum.  using Qvar but not regularly.  no rescue inhaler.  no fevers.  had diarrhea but this has resolved.  no ear pain, facial pain.  Allergies (verified): 1)  ! Pcn 2)  ! Sulfa  Past History:  Past Medical History: Last updated: 10/04/2009 AODM Asthma COPD Depression DVT, hx of x multiple, on coumadin  Pulmonary embolism, hx of  x 2  GERD Hypertension increased homocysreine levels Osteopenia hypothyroidism Hyperlipidemia  Review of Systems      See HPI  Physical Exam  General:  alert, well-developed, and well-nourished.   Head:  NCAT, no TTP over sinuses Ears:  TMs obscured by wax Nose:  + congestion and rhinorrha Mouth:  + PND Lungs:  diffuse expiratory wheezes, no crackles.  wheezing improved s/p neb Heart:  normal rate, regular rhythm, no murmur, and no gallop.     Impression & Recommendations:  Problem # 1:  BRONCHITIS- ACUTE (ICD-466.0) Assessment New  pt's acute illness is worsening her asthma.  pt to start Zpack for bronchitis.  Continue Qvar but use regularly as directed.  Albuterol as needed.  add Mucinex to thin congestion.  pt reports feeling much better after office neb.  reviewed supportive care and red flags that should prompt return.  Pt expresses understanding and is in agreement w/ this plan. Her updated medication list for this problem includes:    Qvar 80 Mcg/act Aers (Beclomethasone dipropionate) .Marland Kitchen... 2 puffs bid    Azithromycin 250 Mg Tabs  (Azithromycin) .Marland Kitchen... 2 by  mouth today and then 1 daily for 4 days    Ventolin Hfa 108 (90 Base) Mcg/act Aers (Albuterol sulfate) .Marland Kitchen... 2 puffs every 4 hours as needed for shortness of breath or wheezing  Orders: Nebulizer Tx (13244)  Complete Medication List: 1)  Budeprion Xl 300 Mg Tb24 (Bupropion hcl) .Marland Kitchen.. 1 by mouth once daily 2)  Levothyroxine Sodium 100 Mcg Tabs (Levothyroxine sodium) .Marland Kitchen.. 1 by mouth once daily 3)  Lisinopril 10 Mg Tabs (Lisinopril) .Marland Kitchen.. 1 by mouth qd 4)  Actonel 150 Mg Tabs (Risedronate sodium) .Marland Kitchen.. 1 by mouth one time a month - stop boniva 5)  Qvar 80 Mcg/act Aers (Beclomethasone dipropionate) .... 2 puffs bid 6)  Coumadin 6 Mg Tabs (Warfarin sodium) .... Take as directed by coumadin clinic. 7)  Ergocalciferol 50000 Unit Caps (Ergocalciferol) .Marland Kitchen.. 1 by mouth qwk x 12 weeks then start otc vit d 1200 units daily 8)  Lipitor 20 Mg Tabs (Atorvastatin calcium) .Marland Kitchen.. 1 by mouth at bedtime 9)  Triamcinoline Cream 0.1%  .... Apply for rash on face per pt 10)  One Touch Delica Lancets Misc (Lancets) .... Checks blood sugar 1x day, dx 250.00 11)  Prodigy Blood Glucose Test Strp (Glucose blood) .... Check bs 1x daily, dx 250.00 12)  Azithromycin 250 Mg Tabs (Azithromycin) .... 2 by  mouth today and then 1  daily for 4 days 13)  Ventolin Hfa 108 (90 Base) Mcg/act Aers (Albuterol sulfate) .... 2 puffs every 4 hours as needed for shortness of breath or wheezing  Patient Instructions: 1)  Please follow up on Monday if no better 2)  Take the Azithromycin as directed- let the coumadin clinic know you are taking this 3)  Take Mucinex (over the counter) to thin your congestion 4)  Robitussin as needed for cough 5)  Use the Albuterol inhaler as needed for wheezing and shortness of breath.  2 puffs. 6)  Tylenol for pain or fever 7)  Hang in there! Prescriptions: VENTOLIN HFA 108 (90 BASE) MCG/ACT AERS (ALBUTEROL SULFATE) 2 puffs every 4 hours as needed for shortness of breath or  wheezing  #1 x 1   Entered and Authorized by:   Neena Rhymes MD   Signed by:   Neena Rhymes MD on 01/02/2010   Method used:   Electronically to        [facility]  [phone]* (retail)       [address] [place], [place]  [address]       Ph: [phone] or [phone]       Fax: [phone]   RxID:   [id number] AZITHROMYCIN 250 MG  TABS (AZITHROMYCIN) 2 by  mouth today and then 1 daily for 4 days  #6 x 0   Entered and Authorized by:   Neena Rhymes MD   Signed by:   Neena Rhymes MD on 01/02/2010   Method used:   Electronically to        [facility]  [phone]* (retail)       [address] [place], [place]  [address]       Ph: [phone] or [phone]       Fax: [phone]   RxID:   [id number]

## 2010-10-07 NOTE — Miscellaneous (Signed)
Summary: med change from home health  Medications Added LEVOTHROID 100 MCG TABS (LEVOTHYROXINE SODIUM) 1 by mouth daily LEVOTHROID 137 MCG TABS (LEVOTHYROXINE SODIUM) 1 by mouth daily QVAR 40 MCG/ACT AERS (BECLOMETHASONE DIPROPIONATE) bid VITAMIN D3 2000 UNIT CAPS (CHOLECALCIFEROL) qd       Clinical Lists Changes  Medications: Changed medication from SYNTHROID 137 MCG TABS (LEVOTHYROXINE SODIUM) 1 by mouth daily. to LEVOTHROID 100 MCG TABS (LEVOTHYROXINE SODIUM) 1 by mouth daily - Signed Added new medication of VITAMIN D3 2000 UNIT CAPS (CHOLECALCIFEROL) qd - Signed Changed medication from QVAR 80 MCG/ACT  AERS (BECLOMETHASONE DIPROPIONATE) 2 puffs bid to QVAR 40 MCG/ACT AERS (BECLOMETHASONE DIPROPIONATE) bid - Signed Changed medication from LEVOTHROID 100 MCG TABS (LEVOTHYROXINE SODIUM) 1 by mouth daily to LEVOTHROID 137 MCG TABS (LEVOTHYROXINE SODIUM) 1 by mouth daily

## 2010-10-07 NOTE — Medication Information (Signed)
Summary: rov/ewj  Anticoagulant Therapy  Managed by: Leota Sauers, PharmD, BCPS, CPP Supervising MD: Daleen Squibb MD, Maisie Fus Indication 1: Deep Vein Thrombosis - Leg (ICD-451.1) Indication 2: CVA-stroke (ICD-436) Lab Used: [facility] Site: [facility] INR POC 2.3 INR RANGE 2 - 3  Dietary changes: no    Health status changes: no    Bleeding/hemorrhagic complications: no    Recent/future hospitalizations: no    Any changes in medication regimen? no    Recent/future dental: no  Any missed doses?: no       Is patient compliant with meds? yes       Current Medications (verified): 1)  Budeprion Xl 300 Mg Tb24 (Bupropion Hcl) .Marland Kitchen.. 1 By Mouth Once Daily 2)  Levothyroxine Sodium 100 Mcg Tabs (Levothyroxine Sodium) .Marland Kitchen.. 1 By Mouth Once Daily 3)  Lisinopril 10 Mg Tabs (Lisinopril) .Marland Kitchen.. 1 By Mouth Qd 4)  Actonel 150 Mg Tabs (Risedronate Sodium) .Marland Kitchen.. 1 By Mouth One Time A Month - Stop Boniva 5)  Qvar 80 Mcg/act  Aers (Beclomethasone Dipropionate) .... 2 Puffs Bid 6)  Coumadin 6 Mg Tabs (Warfarin Sodium) .... Take As Directed By Coumadin Clinic. 7)  Ergocalciferol 50000 Unit Caps (Ergocalciferol) .Marland Kitchen.. 1 By Mouth Qwk X 12 Weeks Then Start Otc Vit D 1200 Units Daily 8)  Lipitor 20 Mg Tabs (Atorvastatin Calcium) .Marland Kitchen.. 1 By Mouth At Bedtime 9)  Triamcinoline Cream 0.1% .... Apply For Rash On Face Per Pt 10)  One Touch Delica Lancets  Misc (Lancets) .... Checks Blood Sugar 1x Day, Dx 250.00 11)  Prodigy Blood Glucose Test  Strp (Glucose Blood) .... Check Bs 1x Daily, Dx 250.00 12)  Ventolin Hfa 108 (90 Base) Mcg/act Aers (Albuterol Sulfate) .... 2 Puffs Every 4 Hours As Needed For Shortness of Breath or Wheezing  Allergies: 1)  ! Pcn 2)  ! Sulfa  Anticoagulation Management History:      The patient is taking warfarin and comes in today for a routine follow up visit.  Positive risk factors for bleeding include an age of 73 years or older and presence of serious comorbidities.  The bleeding index  is 'intermediate risk'.  Positive CHADS2 values include History of HTN and History of Diabetes.  Negative CHADS2 values include Age > 49 years old.  The start date was 05/16/2007.  Her last INR was 8.5 RATIO.  Anticoagulation responsible provider: Daleen Squibb MD, Maisie Fus.  INR POC: 2.3.  Cuvette Lot#: [id number].  Exp: 01/2011.    Anticoagulation Management Assessment/Plan:      The patient's current anticoagulation dose is Coumadin 6 mg tabs: Take as directed by coumadin clinic..  The target INR is 2 - 3.  The next INR is due 02/04/2010.  Anticoagulation instructions were given to patient.  Results were reviewed/authorized by Leota Sauers, PharmD, BCPS, CPP.         Prior Anticoagulation Instructions: INR 2.5  Continue on same dosage 1/2 tablet daily except 1 tablet on Tuesdays and Fridays.  Recheck in 3 weeks.    Current Anticoagulation Instructions: INR 2.3  Coumadin 1 tab = 6mg  on Tue and Fri  1/2 tab all other days

## 2010-10-07 NOTE — Assessment & Plan Note (Signed)
Summary: FOLLW//UP//PH   Vital Signs:  Patient profile:   75 year old female Weight:      196.50 pounds Pulse rate:   66 / minute Pulse rhythm:   regular BP sitting:   130 / 72  (left arm) Cuff size:   large  Vitals Entered By: [name] CMA (April 15, 2010 11:30 AM) CC: Pt here for Hospital f/u Comments On pts synthroid we have 137 micrograms and the hosptial has 100 micrograms.    History of Present Illness: Hospital followup, here with her sister admitted with dizziness  DATE OF ADMISSION:  04/09/2010   DATE OF DISCHARGE:  04/11/2010   chart is reviewed coreg was added to her meds also Rx meclizine was eval  by PT, low risk for falls according to the discharge summary MRI of the brain showed no acute changes 04-09-2010 labs TSH 2.7 Total cholesterol 160, HDL 57, LDL 89 BMP showed a creatinine of 1.2 CBC showed a hemoglobin of 13, platelets 217, WBC is 8.9.     Current Medications (verified): 1)  Budeprion Xl 300 Mg Tb24 (Bupropion Hcl) .Marland Kitchen.. 1 By Mouth Once Daily 2)  Levothyroxine Sodium 137 Mcg Tabs (Levothyroxine Sodium) .Marland Kitchen.. 1 By Mouth Once Daily 3)  Lisinopril 10 Mg Tabs (Lisinopril) .Marland Kitchen.. 1 By Mouth Qd 4)  Actonel 150 Mg Tabs (Risedronate Sodium) .Marland Kitchen.. 1 By Mouth One Time A Month - Stop Boniva 5)  Qvar 80 Mcg/act  Aers (Beclomethasone Dipropionate) .... 2 Puffs Bid 6)  Coumadin 6 Mg Tabs (Warfarin Sodium) .... Take As Directed By Coumadin Clinic. 7)  Lipitor 20 Mg Tabs (Atorvastatin Calcium) .Marland Kitchen.. 1 By Mouth At Bedtime 8)  One Touch Delica Lancets  Misc (Lancets) .... Checks Blood Sugar 1x Day, Dx 250.00 9)  Prodigy Blood Glucose Test  Strp (Glucose Blood) .... Check Bs 1x Daily, Dx 250.00 10)  Ventolin Hfa 108 (90 Base) Mcg/act Aers (Albuterol Sulfate) .... 2 Puffs Every 4 Hours As Needed For Shortness of Breath or Wheezing 11)  Coreg 12.5 Mg Tabs (Carvedilol) .... Take 1 By Mouth Daily 12)  Antivert 25 Mg Tabs (Meclizine Hcl) .Marland Kitchen.. 1 By Mouth Daily At  Bedtime. 13)  Zofran 4 Mg Tabs (Ondansetron Hcl) .... Take 1 By Mouth Q6hrs As Needed  Allergies (verified): 1)  ! Pcn 2)  ! Sulfa  Past History:  Past Medical History: Reviewed history from 01/27/2010 and no changes required. AODM Asthma, COPD Depression DVT, hx of x multiple, on coumadin  Pulmonary embolism, hx of  x 2  GERD Hypertension increased homocysreine levels Osteopenia hypothyroidism Hyperlipidemia  Past Surgical History: Reviewed history from 05/16/2008 and no changes required. Appendectomy Cholecystectomy Lumbar fusion,  in the 90s Tubal ligation Tonsillectomy  Social History: Reviewed history from 02/27/2009 and no changes required. Single has a boyfriend 2 kids lives by self doesn't drive sister handles all her financial affairs  tobacco--no currently a smoker ETOH-- socially   Review of Systems       since she left the hospital, she still gets dizzy from time to time but overall is improving dizzy spells were so intense that she couldn't walk Denies nausea Mild frontal headache daily When asked about chest pain, states that for the last one or 2 years he has occasional ache in the anterior chest. Cannot describe it further. Burning type? Exertional?  Physical Exam  General:  alert and well-developed.   Chest Wall:  nontender to palpation Lungs:  normal respiratory effort, no intercostal retractions, and  no accessory muscle use.  slightly decreased breath sounds Heart:  normal rate, regular rhythm, no murmur, and no gallop.   Extremities:  no lower extremity edema Neurologic:  alert, cooperative, pleasant. Extremities: Motor symmetric   Impression & Recommendations:  Problem # 1:  DIZZINESS (ICD-780.4) h/o intense dizziness, now  getting better, status post admission to the hospital, w/u ne  Plan continue with Antivert and observation Her updated medication list for this problem includes:    Antivert 25 Mg Tabs (Meclizine hcl) .Marland Kitchen... 1  by mouth daily at bedtime.    Zofran 4 Mg Tabs (Ondansetron hcl) .Marland Kitchen... Take 1 by mouth q6hrs as needed  Problem # 2:  HYPERTENSION (ICD-401.9) she is now on a  beta blockers, BP well-controlled 04-09-2010 labs BMP showed a creatinine of 1.2  Her updated medication list for this problem includes:    Lisinopril 10 Mg Tabs (Lisinopril) .Marland Kitchen... 1 by mouth qd    Coreg 12.5 Mg Tabs (Carvedilol) .Marland Kitchen... Take 1 by mouth dailyon  BP today: 130/72 Prior BP: 130/70 (01/27/2010)  Labs Reviewed: K+: 3.9 (10/04/2009) Creat: : 1.2 (10/04/2009)   Chol: 167 (07/09/2009)   HDL: 50.60 (07/09/2009)   LDL: 98 (07/09/2009)   TG: 90.0 (07/09/2009)  Problem # 3:  HYPERLIPIDEMIA (ICD-272.4) 04-09-2010 labs Total cholesterol 160, HDL 57, LDL 89  Her updated medication list for this problem includes:    Lipitor 20 Mg Tabs (Atorvastatin calcium) .Marland Kitchen... 1 by mouth at bedtime  Labs Reviewed: SGOT: 22 (01/27/2010)   SGPT: 16 (01/27/2010)   HDL:50.60 (07/09/2009), 40.30 (03/06/2009)  LDL:98 (07/09/2009), 109 (16/06/9603)  Chol:167 (07/09/2009), 229 (03/06/2009)  Trig:90.0 (07/09/2009), 110.0 (03/06/2009)  Problem # 4:  HYPOTHYROIDISM (ICD-244.9) 04-09-2010 labs TSH 2.7  no change Her updated medication list for this problem includes:    Levothyroxine Sodium 137 Mcg Tabs (Levothyroxine sodium) .Marland Kitchen... 1 by mouth once daily  Labs Reviewed: TSH: 2.81 (03/12/2010)    HgBA1c: 6.2 (01/27/2010) Chol: 167 (07/09/2009)   HDL: 50.60 (07/09/2009)   LDL: 98 (07/09/2009)   TG: 90.0 (07/09/2009)  Problem # 5:  DIABETES MELLITUS, TYPE II (ICD-250.00) at goal   Her updated medication list for this problem includes:    Lisinopril 10 Mg Tabs (Lisinopril) .Marland Kitchen... 1 by mouth qd  Labs Reviewed: Creat: 1.2 (10/04/2009)     Last Eye Exam: normal (12/05/2009) Reviewed HgBA1c results: 6.2 (01/27/2010)  6.4 (07/09/2009)  Complete Medication List: 1)  Budeprion Xl 300 Mg Tb24 (Bupropion hcl) .Marland Kitchen.. 1 by mouth once daily 2)   Levothyroxine Sodium 137 Mcg Tabs (Levothyroxine sodium) .Marland Kitchen.. 1 by mouth once daily 3)  Lisinopril 10 Mg Tabs (Lisinopril) .Marland Kitchen.. 1 by mouth qd 4)  Actonel 150 Mg Tabs (Risedronate sodium) .Marland Kitchen.. 1 by mouth one time a month - stop boniva 5)  Qvar 80 Mcg/act Aers (Beclomethasone dipropionate) .... 2 puffs bid 6)  Coumadin 6 Mg Tabs (Warfarin sodium) .... Take as directed by coumadin clinic. 7)  Lipitor 20 Mg Tabs (Atorvastatin calcium) .Marland Kitchen.. 1 by mouth at bedtime 8)  One Touch Delica Lancets Misc (Lancets) .... Checks blood sugar 1x day, dx 250.00 9)  Prodigy Blood Glucose Test Strp (Glucose blood) .... Check bs 1x daily, dx 250.00 10)  Ventolin Hfa 108 (90 Base) Mcg/act Aers (Albuterol sulfate) .... 2 puffs every 4 hours as needed for shortness of breath or wheezing 11)  Coreg 12.5 Mg Tabs (Carvedilol) .... Take 1 by mouth daily 12)  Antivert 25 Mg Tabs (Meclizine hcl) .Marland Kitchen.. 1 by mouth daily at  bedtime. 13)  Zofran 4 Mg Tabs (Ondansetron hcl) .... Take 1 by mouth q6hrs as needed  Patient Instructions: 1)  Coreg  is a new medication for BP. Continue with it 2)  Meclizine is for dizziness, you can take it 3 times a day if needed 3)  Call if your dizziness gets worse 4)  came  back for regular followup in 2 months

## 2010-10-07 NOTE — Assessment & Plan Note (Signed)
Summary: PT CHECK--PH   Nurse Visit   Vital Signs:  Patient Profile:   75 Years Old Female Height:     65 inches Weight:      187.8 pounds BP sitting:   118 / 60  Vitals Entered By: Shary Decamp (May 30, 2008 3:44 PM)                 Prior Medications: MACROBID 100 MG CAPS (NITROFURANTOIN MONOHYD MACRO) Take 1 capsule by mouth once a day BUDEPRION XL 300 MG TB24 (BUPROPION HCL)  LEVOTHYROXINE SODIUM 100 MCG TABS (LEVOTHYROXINE SODIUM)  LISINOPRIL 10 MG TABS (LISINOPRIL) 1 by mouth qd LIPITOR 20 MG TABS (ATORVASTATIN CALCIUM) 1 by mouth qd WARFARIN SODIUM 6 MG TABS (WARFARIN SODIUM)  ALBUTEROL 90 MCG/ACT AERS (ALBUTEROL) 1-2 puffs q4-6h prn DIAZEPAM 2 MG  TABS (DIAZEPAM) 1/2-1 by mouth three times a day prn BONIVA 150 MG  TABS (IBANDRONATE SODIUM) 1 every month QVAR 80 MCG/ACT  AERS (BECLOMETHASONE DIPROPIONATE) 2 puffs bid TRAMADOL HCL 50 MG  TABS (TRAMADOL HCL) 1 by mouth qid as needed pain VICODIN 5-500 MG TABS (HYDROCODONE-ACETAMINOPHEN) 1-2 qid Current Allergies: ! PCN ! SULFA Laboratory Results   Blood Tests     PT: 19.3 s   (Normal Range: 10.6-13.4)  INR: 2.5   (Normal Range: 0.88-1.12   Therap INR: 2.0-3.5) Comments: CURRENT DOSE: 6mg  tabs - 1/2 by mouth once daily except 1 on tues, thurs, & sat No change recheck 4 weeks .....Marland KitchenMarland KitchenShary Decamp  May 30, 2008 3:45 PM      Influenza Vaccine    Vaccine Type: Fluvax MCR    Site: left deltoid    Mfr: GlaxoSmithKline    Dose: 0.5 ml    Route: IM    Given by: Shary Decamp    Exp. Date: 03/06/2009    Lot #: [id number]  Flu Vaccine Consent Questions    Do you have a history of severe allergic reactions to this vaccine? no    Any prior history of allergic reactions to egg and/or gelatin? no    Do you have a sensitivity to the preservative Thimersol? no    Do you have a past history of Guillan-Barre Syndrome? no    Do you currently have an acute febrile illness? no    Have you ever had a  severe reaction to latex? no    Vaccine information given and explained to patient? yes    Are you currently pregnant? no   Orders Added: 1)  Influenza Vaccine MCR [00025] 2)  Est. Patient Level I [13086] 3)  Protime Ila.[email or web address]    ]

## 2010-10-07 NOTE — Letter (Signed)
Summary: Primary Care Consult Scheduled Letter  [facility]  [address] [place], [place] [address]   Phone: [phone]  Fax: [phone]      05/17/2008 MRN: [id number]  MAYDA SHIPPEE [address] [place], [place]  [address]    Dear Ms. Mcquigg,      We have scheduled an appointment for you.  At the recommendation of Dr.Paz, we have scheduled you an appt on September 16th at 12:30pm for your MRI. Their address is [address] The office phone number is [phone].  If this appointment day and time is not convenient for you, please feel free to call the office of the doctor you are being referred to at the number listed above and reschedule the appointment.     It is important for you to keep your scheduled appointments. We are here to make sure you are given good patient care. If you have questions or you have made changes to your appointment, please notify us at  [phone], ask for Tiffany.    Thank you,  Patient Care Coordinator [place] [facility]

## 2010-10-07 NOTE — Letter (Signed)
Summary: Generic Letter  [facility] [place]  [address] [place], [place] [address]   Phone: [phone]  Fax: [phone]    01/21/2009  Dawn Morrow [address] [place], [place]  [address]  DOB Oct 13, 1935   To Whom It May Concern:    Patient should go down to a BMI of 29  ~ ~ ~ 175# over a 1 year period.  After 1 year we will re-assess.     Sincerely,    Willow Ora, MD

## 2010-10-07 NOTE — Progress Notes (Signed)
Summary: change brand glucose metor  Phone Note Other Incoming   Caller: vivian Summary of Call: vivian from [facility] home health called --dr Drue Novel prescribed 1 touch - strips- lancets--medicaid wont pay for 1 touch -she wanted to know if he could prescribe prodigy metor - lancet & test  strips - which medicaid will pay for = if so [facility] Initial call taken by: Okey Regal Spring,  April 17, 2010 3:16 PM  Follow-up for Phone Call        left detailed message that we sent in the new meter for her. [name] CMA  April 18, 2010 8:20 AM     New/Updated Medications: PRODIGY PREFERRED MONITOR W/DEVICE KIT (BLOOD GLUCOSE MONITORING SUPPL) as directed. PRODIGY LANCING DEVICE  MISC (LANCET DEVICES) two times a day PRODIGY BLOOD GLUCOSE TEST  STRP (GLUCOSE BLOOD) two times a day Prescriptions: PRODIGY BLOOD GLUCOSE TEST  STRP (GLUCOSE BLOOD) two times a day  #100 x 2   Entered by:   [name] CMA   Authorized by:   Nolon Rod. Paz MD   Signed by:   [name] CMA on 04/18/2010   Method used:   Electronically to        [facility]  [phone]* (retail)       [address] [place], [place]  [address]       Ph: [phone] or [phone]       Fax: [phone]   RxID:   [id number] PRODIGY LANCING DEVICE  MISC (LANCET DEVICES) two times a day  #100 x 2   Entered by:   [name] CMA   Authorized by:   Nolon Rod. Paz MD   Signed by:   [name] CMA on 04/18/2010   Method used:   Electronically to        [facility]  [phone]* (retail)       [address] [place], [place]  [address]       Ph: [phone] or [phone]       Fax: [phone]   RxID:   [id number] PRODIGY PREFERRED MONITOR W/DEVICE KIT (BLOOD GLUCOSE MONITORING SUPPL) as directed.  #1 x 0   Entered by:   [name] CMA   Authorized by:   Nolon Rod. Paz MD   Signed by:   [name] CMA on 04/18/2010   Method used:   Electronically to        [facility]  [phone][facility] (retail)       [address] [place], [place]  [address]       Ph: [phone] or [phone]       Fax: [phone]   RxID:   [id number]

## 2010-10-07 NOTE — Miscellaneous (Signed)
Summary: Treatment Plan/[facility]  Treatment Plan/[facility]   Imported By: Sherian Rein 05/15/2010 14:49:27  _____________________________________________________________________  External Attachment:    Type:   Image     Comment:   External Document

## 2010-10-07 NOTE — Medication Information (Signed)
Summary: rov/kb  Anticoagulant Therapy  Managed by: Elaina Pattee, PharmD Supervising MD: Clifton James MD, Cristal Deer Indication 1: Deep Vein Thrombosis - Leg (ICD-451.1) Indication 2: CVA-stroke (ICD-436) Lab Used: [facility] Site: [facility] INR POC 3.2 INR RANGE 2 - 3  Dietary changes: yes       Details: Has not been eating greens.  Health status changes: no    Bleeding/hemorrhagic complications: no    Recent/future hospitalizations: no    Any changes in medication regimen? no    Recent/future dental: no  Any missed doses?: no       Is patient compliant with meds? yes       Allergies: 1)  ! Pcn 2)  ! Sulfa  Anticoagulation Management History:      The patient is taking warfarin and comes in today for a routine follow up visit.  Positive risk factors for bleeding include an age of 75 years or older and presence of serious comorbidities.  The bleeding index is 'intermediate risk'.  Positive CHADS2 values include History of HTN and History of Diabetes.  Negative CHADS2 values include Age > 43 years old.  The start date was 05/16/2007.  Her last INR was 8.5 RATIO.  Anticoagulation responsible provider: Clifton James MD, Cristal Deer.  INR POC: 3.2.  Cuvette Lot#: [id number].  Exp: 04/2011.    Anticoagulation Management Assessment/Plan:      The patient's current anticoagulation dose is Coumadin 6 mg tabs: Take as directed by coumadin clinic..  The target INR is 2 - 3.  The next INR is due 02/26/2010.  Anticoagulation instructions were given to patient.  Results were reviewed/authorized by Elaina Pattee, PharmD.  She was notified by Elaina Pattee, PharmD.         Prior Anticoagulation Instructions: INR-1.3 Take 1 tablet tomorrow 02/05/10. Then resume normal schedule. Take 1 tablet on Tuesday and Friday and take 1/2 a tablet on all other days. Recheck in 10 days.  Current Anticoagulation Instructions: INR 3.2. Hold dose on Saturday, then take 0.5 tablet daily except 1 tablet on Tues and  Fri. Recheck in 10-14 days.

## 2010-10-07 NOTE — Assessment & Plan Note (Signed)
Summary: yearly//tl  Medications Added METROGEL 1 %  GEL (METRONIDAZOLE) apply at bedtime x 5 days      Allergies Added: ! PCN ! SULFA  Vital Signs:  Patient Profile:   75 Years Old Female Height:     65 inches Weight:      182.4 pounds Pulse rate:   86 / minute BP sitting:   132 / 80  Vitals Entered By: Shary Decamp (September 13, 2007 10:15 AM)                 Chief Complaint:  yearly - fasting.  History of Present Illness: yearly  Current Allergies (reviewed today): ! PCN ! SULFA  Past Medical History:    Reviewed history from 07/22/2007 and no changes required:       Asthma       COPD       Depression       DVT, hx of x multiple, on coumadin        Pulmonary embolism, hx of  x 2        GERD       Hypertension       Hyperthyroidism       increased homocysreine levels       Osteopenia       hypothyroidism       Hyperlipidemia  Past Surgical History:    Reviewed history from 09/11/2006 and no changes required:       Appendectomy       Cholecystectomy       Lumbar fusion       Tubal ligation       Tonsillectomy   Family History:    Reviewed history from 07/22/2007 and no changes required:       n. c.  Social History:    Reviewed history from 07/22/2007 and no changes required:       Single       has a boyfriend       2 kids       lives by self       doesn't drive   Risk Factors:  Mammogram History:     Date of Last Mammogram:  11/17/2004    Results:  normal   PAP Smear History:     Date of Last PAP Smear:  11/02/2004    Results:  normal    Review of Systems  General      other than below, ROS  is negative   CV      Denies chest pain or discomfort and swelling of feet.  Resp      Denies cough and wheezing.      occ SOB  GI      Denies bloody stools, diarrhea, nausea, and vomiting.  MS      chronic LBP at baseline   Physical Exam  General:     alert and well-developed.   Neck:     normal carotid upstroke.    Breasts:     No mass, nodules, thickening, tenderness, bulging, retraction, inflamation, nipple discharge or skin changes noted.   Lungs:     normal respiratory effort, no intercostal retractions, no accessory muscle use, and normal breath sounds.   Heart:     normal rate, regular rhythm, and no murmur.   Abdomen:     soft, non-tender, no hepatomegaly, and no splenomegaly.   Genitalia:     no external lesions and mucosa pink and moist.  Cervix looks  nl. Minimal amount of brown d/c Bimanual exam: no mass, no tenderness Extremities:     no edema    Impression & Recommendations:  Problem # 1:  HEALTH SCREENING (ICD-V70.0) chart reviewed EGD 2006: HH gastritis Cscope 07-08-05 colon polyp, int hemorroids, TICs, Bx tubular adenomas PAP today (previous 2006) Empyric Metro Gel  (bact. vaginitis?) MMG >1 year ago, will schedule one pneumonia shot 07-2004 had flu shot 2008  Orders: Radiology Referral (Radiology)   Problem # 2:  OSTEOPENIA (ICD-733.90) DEXA 3-06: hip osteopenia order DEXA Her updated medication list for this problem includes:    Boniva 150 Mg Tabs (Ibandronate sodium) .Marland Kitchen... 1 every month  Orders: T-Bone Densitometry [phone])   Problem # 3:  IMPAIRED FASTING GLUCOSE (ICD-790.21)  Labs Reviewed: HgBA1c: 6.2 (03/24/2007)   Creat: 1.3 (03/24/2007)     Orders: TLB-A1C / Hgb A1C (Glycohemoglobin) (83036-A1C)   Problem # 4:  UTI'S, RECURRENT (ICD-599.0) previously saw urology, neg Cysto Her updated medication list for this problem includes:    Macrobid 100 Mg Caps (Nitrofurantoin monohyd macro) .Marland Kitchen... Take 1 capsule by mouth once a day   Problem # 5:  HYPOTHYROIDISM (ICD-244.9)  Labs Reviewed: TSH: 0.96 (06/29/2007)     Her updated medication list for this problem includes:    Levothyroxine Sodium 100 Mcg Tabs (Levothyroxine sodium)   Problem # 6:  ASTHMA (ICD-493.90) stable Her updated medication list for this problem includes:    Albuterol 90  Mcg/act Aers (Albuterol) .Marland Kitchen... 1-2 puffs q4-6h prn    Azmacort 75 Mcg/act Aers (Triamcinolone acetonide) .Marland KitchenMarland KitchenMarland KitchenMarland Kitchen 3 puffs bid   Problem # 7:  HYPERLIPIDEMIA (ICD-272.4)  Her updated medication list for this problem includes:    Lipitor 20 Mg Tabs (Atorvastatin calcium) .Marland Kitchen... 1 by mouth qd  Orders: TLB-Lipid Panel (80061-LIPID) TLB-Hepatic/Liver Function Pnl (80076-HEPATIC)   Complete Medication List: 1)  Macrobid 100 Mg Caps (Nitrofurantoin monohyd macro) .... Take 1 capsule by mouth once a day 2)  Budeprion Xl 300 Mg Tb24 (Bupropion hcl) 3)  Levothyroxine Sodium 100 Mcg Tabs (Levothyroxine sodium) 4)  Lisinopril 10 Mg Tabs (Lisinopril) .Marland Kitchen.. 1 by mouth qd 5)  Lipitor 20 Mg Tabs (Atorvastatin calcium) .Marland Kitchen.. 1 by mouth qd 6)  Warfarin Sodium 6 Mg Tabs (Warfarin sodium) 7)  Albuterol 90 Mcg/act Aers (Albuterol) .Marland Kitchen.. 1-2 puffs q4-6h prn 8)  Diazepam 2 Mg Tabs (Diazepam) .... 1/2-1 by mouth three times a day prn 9)  Azmacort 75 Mcg/act Aers (Triamcinolone acetonide) .... 3 puffs bid 10)  Boniva 150 Mg Tabs (Ibandronate sodium) .Marland Kitchen.. 1 every month 11)  Metrogel 1 % Gel (Metronidazole) .... Apply at bedtime x 5 days  Other Orders: Tdap => 56yrs IM (40981) Admin 1st Vaccine (19147) TLB-BMP (Basic Metabolic Panel-BMET) (80048-METABOL) Venipuncture (82956)   Patient Instructions: 1)  coumadin 6mg  1  a  day,  1/2 tab Tuesday and Friday 2)  re check in 2 weeks 3)  Vaginal cream  4)  Please schedule a follow-up appointment in 4 months.    Prescriptions: DIAZEPAM 2 MG  TABS (DIAZEPAM) 1/2-1 by mouth three times a day prn  #60 x 0   Entered and Authorized by:   Nolon Rod. Myrtice Lowdermilk MD   Signed by:   Nolon Rod. Mikhala Kenan MD on 09/13/2007   Method used:   Reprint   RxID:   [id number] DIAZEPAM 2 MG  TABS (DIAZEPAM) 1/2-1 by mouth three times a day prn  #60 x 0   Entered and Authorized by:   Wanda Plump  MD   Signed by:   Nolon Rod Huxley Shurley MD on 09/13/2007   Method used:   Print then Give to Patient   RxID:    [id number] METROGEL 1 %  GEL (METRONIDAZOLE) apply at bedtime x 5 days  #1 x 0   Entered and Authorized by:   Nolon Rod. Monico Sudduth MD   Signed by:   Nolon Rod. Fantasha Daniele MD on 09/13/2007   Method used:   Print then Give to Patient   RxID:   [id number]  ]  Tetanus/Td Vaccine    Vaccine Type: Tdap    Site: left deltoid    Dose: 0.5 ml    Route: IM    Given by: Shary Decamp    Exp. Date: 04/26/2008    Lot #: [id number]    ANTICOAGULATION RECORD PREVIOUS REGIMEN & LAB RESULTS Anticoagulation Diagnosis:  pe on  08/16/2007  Previous INR:  2.1 on  08/16/2007 Previous Coumadin Dose(mg):  6mg  on  02/07/2007 Previous Regimen:  1 tablet qd except 1/2 tab m,w,f on  02/07/2007 Previous Coagulation Comments:  no change on  08/16/2007  NEW REGIMEN & LAB RESULTS Anticoag. Dx: pe Current INR: 1.8 Current Coumadin Dose(mg): currently on 6mg  tabs 1 qd except 1/2 on M,W,F Regimen: 1 po qd, 1/2 po tuesday and Friday  Provider: Willow Ora, md      Repeat testing in: 2 weeks MEDICATIONS MACROBID 100 MG CAPS (NITROFURANTOIN MONOHYD MACRO) Take 1 capsule by mouth once a day BUDEPRION XL 300 MG TB24 (BUPROPION HCL)  LEVOTHYROXINE SODIUM 100 MCG TABS (LEVOTHYROXINE SODIUM)  LISINOPRIL 10 MG TABS (LISINOPRIL) 1 by mouth qd LIPITOR 20 MG TABS (ATORVASTATIN CALCIUM) 1 by mouth qd WARFARIN SODIUM 6 MG TABS (WARFARIN SODIUM)  ALBUTEROL 90 MCG/ACT AERS (ALBUTEROL) 1-2 puffs q4-6h prn DIAZEPAM 2 MG  TABS (DIAZEPAM) 1/2-1 by mouth three times a day prn AZMACORT 75 MCG/ACT  AERS (TRIAMCINOLONE ACETONIDE) 3 puffs bid BONIVA 150 MG  TABS (IBANDRONATE SODIUM) 1 every month METROGEL 1 %  GEL (METRONIDAZOLE) apply at bedtime x 5 days  Dose has been reviewed with patient or caretaker during this visit.      Preventive Care Screening  Last Tetanus Booster:    Date:  09/13/2007    Results:  Tdap  Bone Density:    Date:  12/01/2004    Results:  osteopenia std dev  Mammogram:    Date:  11/17/2004     Results:  normal   Pap Smear:    Date:  11/02/2004    Results:  normal

## 2010-10-07 NOTE — Letter (Signed)
Summary: Results Follow up Letter  [place] [facility]  [address] [place], [place] [address]   Phone: [phone]  Fax: [phone]    03/28/2007 MRN: [id number]  Dawn Morrow [address] [place], [place]  [address]  Dear Ms. Fromm,  The following are the results of your recent test(s):  Test         Result    Pap Smear:        Normal _____  Not Normal _____ Comments: ______________________________________________________ Cholesterol: LDL(Bad cholesterol):         Your goal is less than:         HDL (Good cholesterol):       Your goal is more than: Comments:  ______________________________________________________ Mammogram:        Normal _____  Not Normal _____ Comments:  ___________________________________________________________________ Hemoccult:        Normal _____  Not normal _______ Comments:    _____________________________________________________________________ Other Tests: YOUR CHEST X-RAY WAS NORMAL   We routinely do not discuss normal results over the telephone.  If you desire a copy of the results, or you have any questions about this information we can discuss them at your next office visit.   Sincerely,

## 2010-10-07 NOTE — Assessment & Plan Note (Signed)
Summary: PT/[facility]  Medications Added MACROBID 100 MG CAPS (NITROFURANTOIN MONOHYD MACRO) Take 1 capsule by mouth once a day BUDEPRION XL 300 MG TB24 (BUPROPION HCL)  LEVOTHYROXINE SODIUM 100 MCG TABS (LEVOTHYROXINE SODIUM)  FOSAMAX 70 MG TABS (ALENDRONATE SODIUM)  LISINOPRIL 10 MG TABS (LISINOPRIL)  LIPITOR 20 MG TABS (ATORVASTATIN CALCIUM)  WARFARIN SODIUM 6 MG TABS (WARFARIN SODIUM)  ALBUTEROL 90 MCG/ACT AERS (ALBUTEROL)        Nurse Visit   Vital Signs:  Patient Profile:   75 Years Old Female Weight:      181.6 pounds              Prior Medications: MACROBID 100 MG CAPS (NITROFURANTOIN MONOHYD MACRO) Take 1 capsule by mouth once a day BUDEPRION XL 300 MG TB24 (BUPROPION HCL)  LEVOTHYROXINE SODIUM 100 MCG TABS (LEVOTHYROXINE SODIUM)  FOSAMAX 70 MG TABS (ALENDRONATE SODIUM)  LISINOPRIL 10 MG TABS (LISINOPRIL)  LIPITOR 20 MG TABS (ATORVASTATIN CALCIUM)  WARFARIN SODIUM 6 MG TABS (WARFARIN SODIUM)  ALBUTEROL 90 MCG/ACT AERS (ALBUTEROL)   Laboratory Results   Blood Tests      INR: 2.3   (Normal Range: 0.88-1.12   Therap INR: 2.0-3.5)      Orders Added: 1)  Protime [[id number]]      VITAL SIGNS:  Patient Profile:   75 Years Old Female Weight:      181.6 pounds    ANTICOAGULATION RECORD PREVIOUS REGIMEN & LAB RESULTS   Previous INR:  1.8 RATIO on  01/25/2007    NEW REGIMEN & LAB RESULTS Anticoag. Dx: pulmonary embolism Current INR: 2.3 Current Coumadin Dose(mg): 6mg  Regimen: 1 tablet qd except 1/2 tab m,w,f  Provider: Willow Ora, md      Repeat testing in: 4 weeks MEDICATIONS MACROBID 100 MG CAPS (NITROFURANTOIN MONOHYD MACRO) Take 1 capsule by mouth once a day BUDEPRION XL 300 MG TB24 (BUPROPION HCL)  LEVOTHYROXINE SODIUM 100 MCG TABS (LEVOTHYROXINE SODIUM)  FOSAMAX 70 MG TABS (ALENDRONATE SODIUM)  LISINOPRIL 10 MG TABS (LISINOPRIL)  LIPITOR 20 MG TABS (ATORVASTATIN CALCIUM)  WARFARIN SODIUM 6 MG TABS (WARFARIN SODIUM)  ALBUTEROL 90 MCG/ACT  AERS (ALBUTEROL)    Anticoagulation Visit Questionnaire      Coumadin dose missed/changed:  No      Abnormal Bleeding Symptoms:  No   Any diet changes including alcohol intake, vegetables or greens since the last visit:  No Any illnesses or hospitalizations since the last visit:  No Any signs of clotting since the last visit (including chest discomfort, dizziness, shortness of breath, arm tingling, slurred speech, swelling or redness in leg):  No

## 2010-10-07 NOTE — Progress Notes (Signed)
Summary: unable to get pt  Phone Note Other Incoming   Caller: [phone] Summary of Call: [facility] was[facility] able [facility] pt/inr patient wasnt home - she will try tomorrow - she will start discharge papers .Marland KitchenOkey Regal Spring  May 28, 2010 4:57 PM   Follow-up for Phone Call        Today the office received notification from [place] with home health that she again went to see the patient and she was not home. This is the 3rd missed visit and she also stated that she cannot reach the patient via phone.   I spoke with Maureen Ralphs and she will try one more time, then the patient will be discharged. She will give [place] a call back after she tries again tomorrow. Lucious Groves CMA  May 29, 2010 11:34 AM   Additional Follow-up for Phone Call Additional follow up Details #1::        noted Christen Bedoya E. Naje Rice MD  May 30, 2010 12:30 PM

## 2010-10-07 NOTE — Medication Information (Signed)
Summary: rov/cb  Anticoagulant Therapy  Managed by: Cloyde Reams, RN, BSN Supervising MD: Johney Frame MD, Fayrene Fearing Indication 1: Deep Vein Thrombosis - Leg (ICD-451.1) Indication 2: CVA-stroke (ICD-436) Lab Used: [facility] Site: [facility] INR POC 2.5 INR RANGE 2 - 3  Dietary changes: no    Health status changes: yes       Details: Some occ. dizziness when standing or sitting. Pt. instructed to follow up wtih PCP  Bleeding/hemorrhagic complications: no    Recent/future hospitalizations: no    Any changes in medication regimen? no    Recent/future dental: no  Any missed doses?: yes     Details: missed yesterday's dose.  Is patient compliant with meds? yes       Allergies: 1)  ! Pcn 2)  ! Sulfa  Anticoagulation Management History:      The patient is taking warfarin and comes in today for a routine follow up visit.  Positive risk factors for bleeding include an age of 18 years or older and presence of serious comorbidities.  The bleeding index is 'intermediate risk'.  Positive CHADS2 values include History of HTN and History of Diabetes.  Negative CHADS2 values include Age > 68 years old.  The start date was 05/16/2007.  Her last INR was 8.5 RATIO.  Anticoagulation responsible provider: Roger Fasnacht MD, Fayrene Fearing.  INR POC: 2.5.  Cuvette Lot#: [id number].  Exp: 06/2010.    Anticoagulation Management Assessment/Plan:      The patient's current anticoagulation dose is Coumadin 6 mg tabs: Take as directed by coumadin clinic..  The target INR is 2 - 3.  The next INR is due 08/26/2009.  Anticoagulation instructions were given to patient.  Results were reviewed/authorized by Cloyde Reams, RN, BSN.  She was notified by Cloyde Reams RN.         Prior Anticoagulation Instructions: INR 3.4  Start taking 1/2 tablet daily except 1 tablet on Thursdays and Fridays.  Recheck 2-3 weeks.    Current Anticoagulation Instructions: INR 2.5 Today take extra 1/2 pill then resume 1/2 pill everyday except 1  pill on Thursdays and Fridays. Recheck in 4 weeks.

## 2010-10-07 NOTE — Medication Information (Signed)
Summary: rov/ewj  Anticoagulant Therapy  Managed by: Cloyde Reams, RN, BSN Supervising MD: Shirlee Latch MD, Dezyre Hoefer Indication 1: Deep Vein Thrombosis - Leg (ICD-451.1) Indication 2: CVA-stroke (ICD-436) Lab Used: [facility] Site: [facility] INR POC 2.9 INR RANGE 2 - 3  Dietary changes: yes       Details: decr salad intake  Health status changes: no    Bleeding/hemorrhagic complications: no    Recent/future hospitalizations: no    Any changes in medication regimen? no    Recent/future dental: no  Any missed doses?: no       Is patient compliant with meds? yes       Current Medications (verified): 1)  Budeprion Xl 300 Mg Tb24 (Bupropion Hcl) .Marland Kitchen.. 1 By Mouth Once Daily 2)  Levothyroxine Sodium 100 Mcg Tabs (Levothyroxine Sodium) .Marland Kitchen.. 1 By Mouth Once Daily 3)  Lisinopril 10 Mg Tabs (Lisinopril) .Marland Kitchen.. 1 By Mouth Qd 4)  Boniva 150 Mg  Tabs (Ibandronate Sodium) .Marland Kitchen.. 1 Every Month 5)  Qvar 80 Mcg/act  Aers (Beclomethasone Dipropionate) .... 2 Puffs Bid 6)  Cleocin-T 1 % Lotn (Clindamycin Phosphate) .... Apply To The Face Two Times A Day 7)  Coumadin 6 Mg Tabs (Warfarin Sodium) .... Take As Directed By Coumadin Clinic. 8)  Ergocalciferol 50000 Unit Caps (Ergocalciferol) .Marland Kitchen.. 1 By Mouth Qwk X 12 Weeks Then Start Otc Vit D 1200 Units Daily 9)  Lipitor 20 Mg Tabs (Atorvastatin Calcium) .Marland Kitchen.. 1 By Mouth At Bedtime  Allergies (verified): 1)  ! Pcn 2)  ! Sulfa  Anticoagulation Management History:      The patient is taking warfarin and comes in today for a routine follow up visit.  Positive risk factors for bleeding include an age of 73 years or older.  The bleeding index is 'intermediate risk'.  Positive CHADS2 values include History of HTN.  Negative CHADS2 values include Age > 53 years old.  The start date was 05/16/2007.  Her last INR was 8.5 RATIO.  Anticoagulation responsible provider: Shirlee Latch MD, Shree Espey.  INR POC: 2.9.  Cuvette Lot#: [id number].  Exp: 06/07/2010.    Anticoagulation  Management Assessment/Plan:      The patient's current anticoagulation dose is Coumadin 6 mg tabs: Take as directed by coumadin clinic..  The target INR is 2 - 3.  The next INR is due 06/06/2009.  Anticoagulation instructions were given to patient.  Results were reviewed/authorized by Cloyde Reams, RN, BSN.  She was notified by Cloyde Reams RN.         Prior Anticoagulation Instructions: INR 3.7  Skip tomorrow's dose of coumadin then resume 1/2 tablet daily except 1 tablet on Wednesdays, Thursdays, and Fridays.  Recheck in 3 weeks.    Current Anticoagulation Instructions: INR 2.9  Continue on same dosage 1/2 tablet daily except 1 tablet on Wednesdays, Thursdays, and Fridays.  Recheck in 4 weeks.

## 2010-10-07 NOTE — Assessment & Plan Note (Signed)
Summary: pt/cbs   Nurse Visit   Vital Signs:  Patient Profile:   75 Years Old Female CC:      pt check Height:     65 inches Weight:      189.8 pounds BP sitting:   130 / 80                 Prior Medications: MACROBID 100 MG CAPS (NITROFURANTOIN MONOHYD MACRO) Take 1 capsule by mouth once a day BUDEPRION XL 300 MG TB24 (BUPROPION HCL)  LEVOTHYROXINE SODIUM 100 MCG TABS (LEVOTHYROXINE SODIUM)  LISINOPRIL 10 MG TABS (LISINOPRIL) 1 by mouth qd LIPITOR 20 MG TABS (ATORVASTATIN CALCIUM) 1 by mouth qd WARFARIN SODIUM 6 MG TABS (WARFARIN SODIUM)  ALBUTEROL 90 MCG/ACT AERS (ALBUTEROL) 1-2 puffs q4-6h prn DIAZEPAM 2 MG  TABS (DIAZEPAM) 1/2-1 by mouth three times a day prn BONIVA 150 MG  TABS (IBANDRONATE SODIUM) 1 every month QVAR 80 MCG/ACT  AERS (BECLOMETHASONE DIPROPIONATE) 2 puffs bid TRAMADOL HCL 50 MG  TABS (TRAMADOL HCL) 1 by mouth qid as needed pain Current Allergies: ! PCN ! SULFA Laboratory Results   Blood Tests     PT: 28.1 s   (Normal Range: 10.6-13.4)  INR: 5.4   (Normal Range: 0.88-1.12   Therap INR: 2.0-3.5) Comments: Patient should be taking 6mg  tabs 1 tablet daily & 1 1/2 on tues & Fri.  Patient is not sure what she is taking -- she thinks she is doing 1 tablet daily except a 1/2 on tues & friday...........Marland KitchenShary Decamp  Jan 23, 2008 1:11 PM NEW: hold x 3 days then 1/2 daily and 1 on tues & thurs recheck in 2 weeks ................Marland KitchenShary Decamp  Jan 23, 2008 1:12 PM       Orders Added: 1)  Est. Patient Level I [99211] 2)  Protime Ila.Stager    ]     Patient Instructions: 1)  Hold x 3 days then start 1/2 tablet everyday except 1 on tues & thurs 2)  recheck in 2 weeks

## 2010-10-07 NOTE — Medication Information (Signed)
Summary: rov  Anticoagulant Therapy  Managed by: Weston Brass, PharmD Supervising MD: Johney Frame MD, Fayrene Fearing Indication 1: Deep Vein Thrombosis - Leg (ICD-451.1) Indication 2: CVA-stroke (ICD-436) Lab Used: [facility] 18.7 INR POC 2.3  Vital Signs: Blood Pressure:  118 / 78   Dietary changes: no    Health status changes: no    Bleeding/hemorrhagic complications: no    Recent/future hospitalizations: no    Any changes in medication regimen? no    Recent/future dental: no  Any missed doses?: no       Is patient compliant with meds? yes      Comments: dizzy x 3 days; having lower pelvic pain  Current Medications (verified): 1)  Budeprion Xl 300 Mg Tb24 (Bupropion Hcl) .Marland Kitchen.. 1 By Mouth Once Daily 2)  Levothyroxine Sodium 100 Mcg Tabs (Levothyroxine Sodium) .Marland Kitchen.. 1 By Mouth Once Daily 3)  Lisinopril 10 Mg Tabs (Lisinopril) .Marland Kitchen.. 1 By Mouth Qd 4)  Boniva 150 Mg  Tabs (Ibandronate Sodium) .Marland Kitchen.. 1 Every Month 5)  Qvar 80 Mcg/act  Aers (Beclomethasone Dipropionate) .... 2 Puffs Bid 6)  Cleocin-T 1 % Lotn (Clindamycin Phosphate) .... Apply To The Face Two Times A Day  Allergies (verified): 1)  ! Pcn 2)  ! Sulfa  Anticoagulation Management History:      Positive risk factors for bleeding include an age of 73 years or older.  The bleeding index is 'intermediate risk'.  Positive CHADS2 values include History of HTN.  Negative CHADS2 values include Age > 80 years old.  The start date was 05/16/2007.  Her last INR was 8.5 RATIO.    Anticoagulation Management Assessment/Plan:      The patient's current anticoagulation dose is Coumadin 6 mg tabs: Sunday - 0.5 tab, Monday - 0.5 tab, Tuesday - 0.5 tab, Wednesday - 1 tab, Thursday - 1 tab, Friday - 1 tab, Saturday - 0.5 tab.  She is to have a 03/07/2009.  Anticoagulation instructions were given to patient.  Results were reviewed/authorized by Weston Brass, PharmD.  She was notified by Weston Brass PharmD.         Prior Anticoagulation Instructions: 3MG   QD/6MG  WE,TH,FR  Current Anticoagulation Instructions: INR 2.3 The patient is to continue with the same dose of coumadin.  This dosage includes:  Coumadin 6 mg tabs: Sunday - 0.5 tab, Monday - 0.5 tab, Tuesday - 0.5 tab, Wednesday - 1 tab, Thursday - 1 tab, Friday - 1 tab, Saturday - 0.5 tab.

## 2010-10-07 NOTE — Procedures (Signed)
Summary: Gastroenterology--EGD  Gastroenterology--EGD   Imported By: Freddy Jaksch 03/30/2007 16:16:24  _____________________________________________________________________  External Attachment:    Type:   Image     Comment:   EGD

## 2010-10-07 NOTE — Progress Notes (Signed)
Summary: NEEDS A NOTE   Phone Note Call from Patient Call back at Home Phone [phone]   Caller: PT [facility] Call For: Meloni Hinz Summary of Call: SHE HAS JOINED [facility] SHE NEEDS A NOTE FROM DR Faye Sanfilippo SAYING WHAT SHE SHOULD WEIGH.  PLEASE MAIL TO PT @ [address] [place] [address] Initial call taken by: Roselle Locus,  Jan 16, 2009 10:53 AM  Follow-up for Phone Call        according to my BMI chart pt wt should be between 115-150 (bmi 19-25).  What wt would you like for me to put on the letter? Shary Decamp  Jan 16, 2009 4:04 PM recto go down to a BMI of 29 over 1 year ----> 175 pounds, then re-asses her goal Nobuko Gsell E. Theoplis Garciagarcia MD  Jan 20, 2009 2:38 PM   letter written & mailed [facility]  Jan 21, 2009 10:50 AM

## 2010-10-07 NOTE — Assessment & Plan Note (Signed)
Summary: yearly--ph   Vital Signs:  Patient profile:   74 year old female Height:      65 inches Weight:      197.2 pounds Pulse rate:   60 / minute Pulse rhythm:   regular BP sitting:   144 / 78  (left arm) Cuff size:   large  Vitals Entered By: Shary Decamp (February 27, 2009 2:23 PM) CC: yearly   History of Present Illness: ROV Asthma, COPD-- symptoms well control  Depression-- good medication compliance w/  bupropion , mood ok Hypertension-- no ambulatory BPs  Osteopenia-- good medication compliance w/  boniva , no problems swallowing  Hyperlipidemia-- states eats healthy "sometimes"  Allergies: 1)  ! Pcn 2)  ! Sulfa  Past History:  Past Medical History: Asthma COPD Depression DVT, hx of x multiple, on coumadin  Pulmonary embolism, hx of  x 2  GERD Hypertension increased homocysreine levels Osteopenia hypothyroidism Hyperlipidemia  Past Surgical History: Reviewed history from 05/16/2008 and no changes required. Appendectomy Cholecystectomy Lumbar fusion,  in the 90s Tubal ligation Tonsillectomy  Family History: colon ca--  biological M breast ca-- no  Social History: Reviewed history from 09/13/2007 and no changes required. Single has a boyfriend 2 kids lives by self doesn't drive sister handles all her financial affairs  tobacco--no currently a smoker ETOH-- socially   Review of Systems General:  Denies fatigue and fever. CV:  Denies chest pain or discomfort and swelling of feet. Resp:  Denies cough, coughing up blood, and shortness of breath. GI:  Denies bloody stools, vomiting, and vomiting blood. GU:  no SBE no vag. d/c or bleed .  Physical Exam  General:  alert and well-developed.   Neck:  no masses and no thyromegaly.   Breasts:  No mass, nodules, thickening, tenderness, bulging, retraction, inflamation, nipple discharge or skin changes noted.  No LADs Lungs:  normal respiratory effort, no intercostal retractions, no accessory  muscle use, and normal breath sounds.   Heart:  normal rate, regular rhythm, no murmur, and no gallop.   Abdomen:  soft, non-tender, no distention, no masses, no guarding, and no rigidity.   Extremities:  no pretibial edema bilaterally  Psych:  normally interactive, good eye contact, not anxious appearing, and not depressed appearing.     Impression & Recommendations:  Problem # 1:  HYPERLIPIDEMIA (ICD-272.4) diet only , labs  Labs Reviewed: SGOT: 23 (11/28/2008)   SGPT: 19 (11/28/2008)   HDL:55.4 (09/13/2007)  LDL:109 (09/13/2007)  Chol:185 (09/13/2007)  Trig:102 (09/13/2007)  Problem # 2:  HEALTH SCREENING (ICD-V70.0) chart reviewed    Pap Smear:  normal (09/13/2007), next 2012    Mammogram:  normal (09/29/2007)-- schedule one    breast exam done today, rec SBE    Last Tetanus Booster:  Tdap (09/13/2007)    Last Pneumovax:  Pneumovax (07/12/2004) and today    printed material provided regards shingles shot    Cscope 07-08-05 colon polyp, int hemorroids, TICs, Bx tubular adenomas  Orders: Radiology Referral (Radiology)  Problem # 3:  OSTEOPENIA (ICD-733.90) check vit D DEXA 1-09: osteopenia but better than before, to cont. same meds Her updated medication list for this problem includes:    Boniva 150 Mg Tabs (Ibandronate sodium) .Marland Kitchen... 1 every month  Problem # 4:  IMPAIRED FASTING GLUCOSE (ICD-790.21) labs   Problem # 5:  HYPOTHYROIDISM (ICD-244.9) stable  Her updated medication list for this problem includes:    Levothyroxine Sodium 100 Mcg Tabs (Levothyroxine sodium) .Marland Kitchen... 1 by mouth once daily  Labs Reviewed: TSH: 0.92 (11/20/2008)    HgBA1c: 5.9 (09/13/2007) Chol: 185 (09/13/2007)   HDL: 55.4 (09/13/2007)   LDL: 109 (09/13/2007)   TG: 102 (09/13/2007)  Problem # 6:  HYPERTENSION (ICD-401.9) see instructions   Her updated medication list for this problem includes:    Lisinopril 10 Mg Tabs (Lisinopril) .Marland Kitchen... 1 by mouth qd  BP today: 144/78 Prior BP: 118/78  (02/07/2009)  Labs Reviewed: K+: 4.4 (11/28/2008) Creat: : 1.2 (11/28/2008)   Chol: 185 (09/13/2007)   HDL: 55.4 (09/13/2007)   LDL: 109 (09/13/2007)   TG: 102 (09/13/2007)  Problem # 7:  UTI'S, RECURRENT (ICD-599.0) labs   Problem # 8:  COPD (ICD-496) stable  Her updated medication list for this problem includes:    Qvar 80 Mcg/act Aers (Beclomethasone dipropionate) .Marland Kitchen... 2 puffs bid  Complete Medication List: 1)  Budeprion Xl 300 Mg Tb24 (Bupropion hcl) .Marland Kitchen.. 1 by mouth once daily 2)  Levothyroxine Sodium 100 Mcg Tabs (Levothyroxine sodium) .Marland Kitchen.. 1 by mouth once daily 3)  Lisinopril 10 Mg Tabs (Lisinopril) .Marland Kitchen.. 1 by mouth qd 4)  Boniva 150 Mg Tabs (Ibandronate sodium) .Marland Kitchen.. 1 every month 5)  Qvar 80 Mcg/act Aers (Beclomethasone dipropionate) .... 2 puffs bid 6)  Cleocin-t 1 % Lotn (Clindamycin phosphate) .... Apply to the face two times a day 7)  Coumadin 6 Mg Tabs (Warfarin sodium) .... Sunday - 0.5 tab, monday - 0.5 tab, tuesday - 0.5 tab, wednesday - 1 tab, thursday - 1 tab, friday - 1 tab, saturday - 0.5 tab  Other Orders: Pneumoccal Vaccine Adm- Medicare (G0009) Admin 1st Vaccine (96295)  Patient Instructions: 1)  came back fasting: 2)  A1C Dx hyperglycemia 3)  FLP  Dx high chol 4)  Vit D  Dx osteopenia 5)  UA and UCX if needed Dx recurrent UTIs  6)  Please schedule a follow-up appointment in 6 months .  7)  Check your blood pressure 2 or 3 times amonth. If it is more than 140/85 consistently,please let us know    Immunizations Administered:  Pneumonia Vaccine:    Vaccine Type: Pneumovax (Medicare)    Site: right deltoid    Mfr: Merck    Dose: 0.5 ml    Route: IM    Given by: Shary Decamp    Exp. Date: 08/10/2009    Lot #: [id number]    Preventive Care Screening  Colonoscopy:    Date:  07/08/2005    Next Due:  07/2010    Results:  hemorrhoids, diverticulosis, polyps  Bone Density:    Date:  09/29/2007    Results:  osteopenia  Prior Values:    Pap Smear:   normal (09/13/2007)    Mammogram:  normal (09/29/2007)    Bone Density:  osteopenia (12/01/2004)    Last Tetanus Booster:  Tdap (09/13/2007)    Last Flu Shot:  Fluvax MCR (05/30/2008)    Last Pneumovax:  Pneumovax (07/12/2004)    Dexa Interp:  osteopenia (12/01/2004)     Immunizations Administered:  Pneumonia Vaccine:    Vaccine Type: Pneumovax (Medicare)    Site: right deltoid    Mfr: Merck    Dose: 0.5 ml    Route: IM    Given by: Shary Decamp    Exp. Date: 08/10/2009    Lot #: [id number]

## 2010-10-07 NOTE — Letter (Signed)
Summary: Geralynn Rile  [facility]   Imported By: Lanelle Bal 12/13/2009 10:59:09  _____________________________________________________________________  External Attachment:    Type:   Image     Comment:   External Document

## 2010-10-07 NOTE — Miscellaneous (Signed)
Summary: PT INR Order/[facility]  PT INR Order/[facility]   Imported By: Lanelle Bal 05/20/2010 08:14:00  _____________________________________________________________________  External Attachment:    Type:   Image     Comment:   External Document

## 2010-10-07 NOTE — Miscellaneous (Signed)
Summary: Care Plan/[facility]  Care Plan/[facility]   Imported By: Lanelle Bal 05/19/2010 13:59:55  _____________________________________________________________________  External Attachment:    Type:   Image     Comment:   External Document

## 2010-10-07 NOTE — Consult Note (Signed)
Summary: Nita Sells MD Dermatology  Nita Sells MD Dermatology   Imported By: Lanelle Bal 11/25/2009 08:08:18  _____________________________________________________________________  External Attachment:    Type:   Image     Comment:   External Document

## 2010-10-07 NOTE — Progress Notes (Signed)
Summary: PT/INR  Phone Note Call from Patient Call back at [phone]   Caller: home health Summary of Call: Maureen Ralphs [facility]) Is at the pts home doing her PT/INR- PT- 47.9, INR- 3.9. Pt is taking 6mg  daily- has already taken today. Has an doctors appt tomorrow to see you. [name] CMA  April 14, 2010 1:11 PM   Follow-up for Phone Call        please forward information to the Coumadin clinic Follow-up by: [facility] E. Paz MD,  April 14, 2010 1:26 PM  Additional Follow-up for Phone Call Additional follow up Details #1::        Lab addressed.  See Coumadin Clinic note for details.  Additional Follow-up by: Weston Brass PharmD,  April 15, 2010 8:41 AM

## 2010-10-07 NOTE — Progress Notes (Signed)
Summary: change to qvar  Medications Added QVAR 80 MCG/ACT  AERS (BECLOMETHASONE DIPROPIONATE) 2 puffs bid       Phone Note From Pharmacy   Summary of Call: pharmacy requesting change from azmacort to qvar; ok'd per dr Drue Novel rx faxed to pharmacy ..................................................................Marland KitchenShary Decamp  September 14, 2007 2:32 PM     New/Updated Medications: QVAR 80 MCG/ACT  AERS (BECLOMETHASONE DIPROPIONATE) 2 puffs bid   Prescriptions: QVAR 80 MCG/ACT  AERS (BECLOMETHASONE DIPROPIONATE) 2 puffs bid  #1 x 2   Entered by:   Shary Decamp   Authorized by:   Nolon Rod. Paz MD   Signed by:   Shary Decamp on 09/14/2007   Method used:   Electronically sent to ...       [facility]  [phone]*       [address]       [place], [place]  [address]       Ph: [phone] or [phone]       Fax: [phone]   RxID:   [id number]

## 2010-10-07 NOTE — Miscellaneous (Signed)
Summary: pap   Clinical Lists Changes  Observations: Added new observation of PAP SMEAR: normal (09/13/2007 14:45)      Preventive Care Screening  Pap Smear:    Date:  09/13/2007    Results:  normal

## 2010-10-07 NOTE — Medication Information (Signed)
Summary: rov/tm  Anticoagulant Therapy  Managed by: Lynann Bologna, PharmD Supervising MD: Antoine Poche MD, Fayrene Fearing Indication 1: Deep Vein Thrombosis - Leg (ICD-451.1) Indication 2: CVA-stroke (ICD-436) Lab Used: [facility] Site: [facility] INR POC 3.7 INR RANGE 2 - 3  Dietary changes: no    Health status changes: no    Bleeding/hemorrhagic complications: no    Recent/future hospitalizations: no    Any changes in medication regimen? no    Recent/future dental: no  Any missed doses?: no       Is patient compliant with meds? yes       Current Medications (verified): 1)  Budeprion Xl 300 Mg Tb24 (Bupropion Hcl) .Marland Kitchen.. 1 By Mouth Once Daily 2)  Levothyroxine Sodium 100 Mcg Tabs (Levothyroxine Sodium) .Marland Kitchen.. 1 By Mouth Once Daily 3)  Lisinopril 10 Mg Tabs (Lisinopril) .Marland Kitchen.. 1 By Mouth Qd 4)  Boniva 150 Mg  Tabs (Ibandronate Sodium) .Marland Kitchen.. 1 Every Month 5)  Qvar 80 Mcg/act  Aers (Beclomethasone Dipropionate) .... 2 Puffs Bid 6)  Coumadin 6 Mg Tabs (Warfarin Sodium) .... Take As Directed By Coumadin Clinic. 7)  Ergocalciferol 50000 Unit Caps (Ergocalciferol) .Marland Kitchen.. 1 By Mouth Qwk X 12 Weeks Then Start Otc Vit D 1200 Units Daily 8)  Lipitor 20 Mg Tabs (Atorvastatin Calcium) .Marland Kitchen.. 1 By Mouth At Bedtime  Allergies (verified): 1)  ! Pcn 2)  ! Sulfa  Anticoagulation Management History:      The patient is taking warfarin and comes in today for a routine follow up visit.  Positive risk factors for bleeding include an age of 73 years or older and presence of serious comorbidities.  The bleeding index is 'intermediate risk'.  Positive CHADS2 values include History of HTN and History of Diabetes.  Negative CHADS2 values include Age > 73 years old.  The start date was 05/16/2007.  Her last INR was 8.5 RATIO.  Anticoagulation responsible provider: Antoine Poche MD, Fayrene Fearing.  INR POC: 3.7.  Cuvette Lot#: [id number].  Exp: 10/2010.    Anticoagulation Management Assessment/Plan:      The patient's current  anticoagulation dose is Coumadin 6 mg tabs: Take as directed by coumadin clinic..  The target INR is 2 - 3.  The next INR is due 09/16/2009.  Anticoagulation instructions were given to patient.  Results were reviewed/authorized by Lynann Bologna, PharmD.  She was notified by Lynann Bologna, PharmD.         Prior Anticoagulation Instructions: INR 2.5 Today take extra 1/2 pill then resume 1/2 pill everyday except 1 pill on Thursdays and Fridays. Recheck in 4 weeks.   Current Anticoagulation Instructions: INR 3.7  No pill tomorrow. Then 0.5 tab (3 mg) everday except Fridays. Take 1 tab (6 mg) on Fridays.   Recheck in 3 weeks

## 2010-10-07 NOTE — Medication Information (Signed)
Summary: Coumadin Clinic  Anticoagulant Therapy  Managed by: Inactive Supervising MD: Tenny Craw MD, Gunnar Fusi Indication 1: Deep Vein Thrombosis - Leg (ICD-451.1) Indication 2: CVA-stroke (ICD-436) Lab Used: [facility] [place] Site: [facility] INR RANGE 2 - 3          Comments: Pt now being dosing and orders given to [facility] on 05/15/10 by Dr Beverely Low.   Allergies: 1)  ! Pcn 2)  ! Sulfa  Anticoagulation Management History:      Positive risk factors for bleeding include an age of 2 years or older and presence of serious comorbidities.  The bleeding index is 'intermediate risk'.  Positive CHADS2 values include History of HTN and History of Diabetes.  Negative CHADS2 values include Age > 57 years old.  The start date was 05/16/2007.  Her last INR was 2.4.  Anticoagulation responsible provider: Tenny Craw MD, Gunnar Fusi.  Exp: 06/08/2011.    Anticoagulation Management Assessment/Plan:      The patient's current anticoagulation dose is Coumadin 6 mg tabs: Take as directed by coumadin clinic..  The target INR is 2 - 3.  The next INR is due 05/15/2010.  Anticoagulation instructions were given to patient.  Results were reviewed/authorized by Inactive.         Prior Anticoagulation Instructions: INR 2.4  Attempted to call pt with results.  LM with family member to call back for results. Cloyde Reams RN  May 01, 2010 4:22 PM   Left message with female family member for pt to call for dosing. Bethena Midget RN  May 02, 2010 3:45 PM Spoke with pt advised to continue on same dosage 3mg  daily except 6mg  on Tuesdays and Fridays.  Recheck in 2 weeks.  Called [facility] and gave verbal orders to Lurena Joiner to redraw on 05/15/10.

## 2010-10-07 NOTE — Miscellaneous (Signed)
Summary: neg DM eye exam   Clinical Lists Changes  Observations: Added new observation of DMEYEEXAMNXT: 12/2010 (12/05/2009 12:52) Added new observation of DMEYEEXMRES: normal (12/05/2009 12:52) Added new observation of EYE EXAM BY: [facility] (12/05/2009 12:52) Added new observation of DIAB EYE EX: normal (12/05/2009 12:52)       Diabetes Management Exam:    Eye Exam:       Eye Exam done elsewhere          Date: 12/05/2009          Results: normal          Done by: [facility]

## 2010-10-07 NOTE — Letter (Signed)
Summary: [facility] Office summary  HEALTH CARE SUMMARY   Imported By: Job Founds 03/28/2007 14:50:16  _____________________________________________________________________  External Attachment:    Type:   Image     Comment:   External Document

## 2010-10-07 NOTE — Letter (Signed)
Summary: Results Follow up Letter  [facility]  [address] [place], [place] [address]   Phone: [phone]  Fax: [phone]    09/22/2007 MRN: [id number]  Dawn Morrow [address] [place], [place]  [address]  Dear [name],  The following are the results of your recent test(s):  Test         Result    Pap Smear:        Normal _____  Not Normal _____ Comments: ______________________________________________________ Cholesterol: LDL(Bad cholesterol):         Your goal is less than:         HDL (Good cholesterol):       Your goal is more than: Comments:  ______________________________________________________ Mammogram:        Normal _____  Not Normal _____ Comments:  ___________________________________________________________________ Hemoccult:        Normal _____  Not normal _______ Comments:    _____________________________________________________________________ Other Tests:  The xray of your hip was normal.  If pain persist, please schedule an office visit with Dr. Drue Novel.  We routinely do not discuss normal results over the telephone.  If you desire a copy of the results, or you have any questions about this information we can discuss them at your next office visit.   Sincerely,

## 2010-10-07 NOTE — Progress Notes (Signed)
Summary: PT/INR RESULS  Phone Note From Other Clinic Call back at [phone]   Caller: Home health nurse- Angelique Blonder Summary of Call: Pts home health nurse called with pts PT/INR results. INR- 1.5. Pt is taking 6 mg Tues and Fri and 3 mg all other days. Pt has 6mg  tabs. Please advise on change.  Initial call taken by: [name] CMA,  May 15, 2010 2:40 PM  Follow-up for Phone Call        pt's INR is low.  needs to increase to 6 mg M/W/F and 3 mg on the other days.  recheck labs in 2 weeks Follow-up by: Neena Rhymes MD,  May 15, 2010 2:46 PM  Additional Follow-up for Phone Call Additional follow up Details #1::        Informed pts home health services. [name] CMA  May 15, 2010 2:52 PM

## 2010-10-07 NOTE — Assessment & Plan Note (Signed)
Summary: NUR--PT//PH   Nurse Visit   Vital Signs:  Patient Profile:   75 Years Old Female Height:     65 inches Weight:      192 pounds BP sitting:   140 / 82  Vitals Entered By: Shary Decamp (July 02, 2008 3:36 PM)                 Prior Medications: MACROBID 100 MG CAPS (NITROFURANTOIN MONOHYD MACRO) Take 1 capsule by mouth once a day BUDEPRION XL 300 MG TB24 (BUPROPION HCL)  LEVOTHYROXINE SODIUM 100 MCG TABS (LEVOTHYROXINE SODIUM)  LISINOPRIL 10 MG TABS (LISINOPRIL) 1 by mouth qd LIPITOR 20 MG TABS (ATORVASTATIN CALCIUM) 1 by mouth qd WARFARIN SODIUM 6 MG TABS (WARFARIN SODIUM)  ALBUTEROL 90 MCG/ACT AERS (ALBUTEROL) 1-2 puffs q4-6h prn DIAZEPAM 2 MG  TABS (DIAZEPAM) 1/2-1 by mouth three times a day prn BONIVA 150 MG  TABS (IBANDRONATE SODIUM) 1 every month QVAR 80 MCG/ACT  AERS (BECLOMETHASONE DIPROPIONATE) 2 puffs bid TRAMADOL HCL 50 MG  TABS (TRAMADOL HCL) 1 by mouth qid as needed pain VICODIN 5-500 MG TABS (HYDROCODONE-ACETAMINOPHEN) 1-2 qid Current Allergies: ! PCN ! SULFA Laboratory Results   Blood Tests     PT: 25.0 s   (Normal Range: 10.6-13.4)  INR: 4.3   (Normal Range: 0.88-1.12   Therap INR: 2.0-3.5) Comments: Current Dose: 6mg  tablets 1/2 by mouth once daily except 1 on tues, thurs, & Sat Patient is scheduled to d/c coumadin on 07/05/08 for 7 days.  She is scheduled for steriod inj (back) on 07/12/08.  Will instruct patient to go ahead & hold coumadin starting tonight & recheck 10 days after she restarts it. .....Marland KitchenMarland KitchenShary Decamp  July 02, 2008 3:38 PM        Orders Added: 1)  Est. Patient Level I [18841] 2)  Protime Ila.Stager    ]

## 2010-10-07 NOTE — Medication Information (Signed)
Summary: rov/sp  Anticoagulant Therapy  Managed by: Charolotte Eke, PharmD Supervising MD: Gala Romney MD,Aleysia Oltmann Indication 1: Deep Vein Thrombosis - Leg (ICD-451.1) Indication 2: CVA-stroke (ICD-436) Lab Used: [facility] Site: [facility] INR POC 2.1 INR RANGE 2 - 3  Dietary changes: no    Health status changes: no    Bleeding/hemorrhagic complications: no    Recent/future hospitalizations: no    Any changes in medication regimen? no    Recent/future dental: no  Any missed doses?: no       Is patient compliant with meds? yes       Current Medications (verified): 1)  Budeprion Xl 300 Mg Tb24 (Bupropion Hcl) .Marland Kitchen.. 1 By Mouth Once Daily 2)  Levothyroxine Sodium 137 Mcg Tabs (Levothyroxine Sodium) .Marland Kitchen.. 1 By Mouth Once Daily 3)  Lisinopril 10 Mg Tabs (Lisinopril) .Marland Kitchen.. 1 By Mouth Qd 4)  Actonel 150 Mg Tabs (Risedronate Sodium) .Marland Kitchen.. 1 By Mouth One Time A Month - Stop Boniva 5)  Qvar 80 Mcg/act  Aers (Beclomethasone Dipropionate) .... 2 Puffs Bid 6)  Coumadin 6 Mg Tabs (Warfarin Sodium) .... Take As Directed By Coumadin Clinic. 7)  Lipitor 20 Mg Tabs (Atorvastatin Calcium) .Marland Kitchen.. 1 By Mouth At Bedtime 8)  Triamcinoline Cream 0.1% .... Apply For Rash On Face Per Pt 9)  One Touch Delica Lancets  Misc (Lancets) .... Checks Blood Sugar 1x Day, Dx 250.00 10)  Prodigy Blood Glucose Test  Strp (Glucose Blood) .... Check Bs 1x Daily, Dx 250.00 11)  Ventolin Hfa 108 (90 Base) Mcg/act Aers (Albuterol Sulfate) .... 2 Puffs Every 4 Hours As Needed For Shortness of Breath or Wheezing  Allergies (verified): 1)  ! Pcn 2)  ! Sulfa  Anticoagulation Management History:      The patient is taking warfarin and comes in today for a routine follow up visit.  Positive risk factors for bleeding include an age of 90 years or older and presence of serious comorbidities.  The bleeding index is 'intermediate risk'.  Positive CHADS2 values include History of HTN and History of Diabetes.  Negative CHADS2  values include Age > 18 years old.  The start date was 05/16/2007.  Her last INR was 8.5 RATIO.  Anticoagulation responsible provider: Sevastian Witczak MD,Husna Krone.  INR POC: 2.1.  Cuvette Lot#: [id number].  Exp: 06/08/2011.    Anticoagulation Management Assessment/Plan:      The patient's current anticoagulation dose is Coumadin 6 mg tabs: Take as directed by coumadin clinic..  The target INR is 2 - 3.  The next INR is due 05/01/2010.  Anticoagulation instructions were given to patient.  Results were reviewed/authorized by Charolotte Eke, PharmD.  She was notified by Charolotte Eke, PharmD.         Prior Anticoagulation Instructions: INR 2.1  Continue same dose of 1/2 tablet every day except 1 tablet on Tuesday and Friday.   Current Anticoagulation Instructions: Continue same: 3mg  daily except 6mg  on Tues and Fri.

## 2010-10-07 NOTE — Progress Notes (Signed)
Summary: Dawn Morrow--MRI RESULT  Phone Note Call from Patient Call back at Work Phone [phone]   Caller: Patient Reason for Call: Lab or Test Results Summary of Call: PT IS CALLING ABOUT HER MRI RESULT. Initial call taken by: Freddy Jaksch,  May 24, 2008 10:45 AM  Follow-up for Phone Call        Dr. Drue Novel could you please review ....Marland KitchenMarland KitchenShary Decamp  May 24, 2008 11:37 AM see MRI, call if no better E. Zacherie Honeyman MD  May 24, 2008 1:09 PM  advise pt: MRI did show spinal stenosis which likely explain some of the pain: rest, pain med and refer to Dr Wynn Banker or Dr Ethelene Hal  for managment  Signed by Nolon Rod. Marquon Alcala MD on 05/24/2008 at 1:11 PM  discussed with patient .Marland KitchenMarland KitchenMarland KitchenShary Decamp  May 24, 2008 1:14 PM   New Problems: SPINAL STENOSIS (ICD-724.00)   New Problems: SPINAL STENOSIS (ICD-724.00)

## 2010-10-07 NOTE — Medication Information (Signed)
Summary: rov/cb  Anticoagulant Therapy  Managed by: Weston Brass, PharmD Supervising MD: Johney Frame MD, Fayrene Fearing Indication 1: Deep Vein Thrombosis - Leg (ICD-451.1) Indication 2: CVA-stroke (ICD-436) Lab Used: [facility] Site: [facility] INR POC 3.7 INR RANGE 2 - 3  Dietary changes: yes       Details: did not eat her vit k foods this week   Health status changes: no    Bleeding/hemorrhagic complications: no    Recent/future hospitalizations: no    Any changes in medication regimen? no    Recent/future dental: no  Any missed doses?: no       Is patient compliant with meds? yes       Allergies: 1)  ! Pcn 2)  ! Sulfa  Anticoagulation Management History:      The patient is taking warfarin and comes in today for a routine follow up visit.  Positive risk factors for bleeding include an age of 75 years or older and presence of serious comorbidities.  The bleeding index is 'intermediate risk'.  Positive CHADS2 values include History of HTN and History of Diabetes.  Negative CHADS2 values include Age > 8 years old.  The start date was 05/16/2007.  Her last INR was 8.5 RATIO.  Anticoagulation responsible provider: Edge Mauger MD, Fayrene Fearing.  INR POC: 3.7.  Cuvette Lot#: [id number].  Exp: 05/2011.    Anticoagulation Management Assessment/Plan:      The patient's current anticoagulation dose is Coumadin 6 mg tabs: Take as directed by coumadin clinic..  The target INR is 2 - 3.  The next INR is due 03/14/2010.  Anticoagulation instructions were given to patient.  Results were reviewed/authorized by Weston Brass, PharmD.  She was notified by Weston Brass PharmD.         Prior Anticoagulation Instructions: INR 3.2. Hold dose on Saturday, then take 0.5 tablet daily except 1 tablet on Tues and Fri. Recheck in 10-14 days.  Current Anticoagulation Instructions: INR 3.7  Skip tomorrow's dose of Coumadin then resume same dose of 1/2 tablet every day except 1 tablet on Tuesday and Friday.  Increase greens to 2  servings a week.  Recheck in 2 weeks.

## 2010-10-07 NOTE — Assessment & Plan Note (Signed)
Summary: pt/inr   Nurse Visit   Vital Signs:  Patient Profile:   75 Years Old Female Height:     65 inches Weight:      192.4 pounds BP sitting:   120 / 80                 Prior Medications: MACROBID 100 MG CAPS (NITROFURANTOIN MONOHYD MACRO) Take 1 capsule by mouth once a day BUDEPRION XL 300 MG TB24 (BUPROPION HCL)  LEVOTHYROXINE SODIUM 100 MCG TABS (LEVOTHYROXINE SODIUM)  LISINOPRIL 10 MG TABS (LISINOPRIL) 1 by mouth qd LIPITOR 20 MG TABS (ATORVASTATIN CALCIUM) 1 by mouth qd WARFARIN SODIUM 6 MG TABS (WARFARIN SODIUM)  ALBUTEROL 90 MCG/ACT AERS (ALBUTEROL) 1-2 puffs q4-6h prn DIAZEPAM 2 MG  TABS (DIAZEPAM) 1/2-1 by mouth three times a day prn BONIVA 150 MG  TABS (IBANDRONATE SODIUM) 1 every month QVAR 80 MCG/ACT  AERS (BECLOMETHASONE DIPROPIONATE) 2 puffs bid TRAMADOL HCL 50 MG  TABS (TRAMADOL HCL) 1 by mouth qid as needed pain VICODIN 5-500 MG TABS (HYDROCODONE-ACETAMINOPHEN) 1-2 qid Current Allergies: ! PCN ! SULFA Laboratory Results   Blood Tests     PT: 14.6 s   (Normal Range: 10.6-13.4)  INR: 1.4   (Normal Range: 0.88-1.12   Therap INR: 2.0-3.5) Comments: CURRENT DOSE: PT RESTARTED COUMADIN 07/12/08 -- 6MG  TABS 1/2 by mouth once daily EXCEPT 1 ON TUES, THURS, & SAT NEW DOSE: 6MG  TABLETS 1 by mouth once daily EXCEPT 1/2 ON TUES, THURS, & SAT RECHECK IN 2 WEEKS WRITTEN INSTRUCTIONS GIVEN TO PT .......Marland KitchenShary Decamp  July 24, 2008 4:26 PM       Orders Added: 1)  Est. Patient Level I [99211] 2)  Protime Ila.Stager    ]  Patient Instructions: 1)  6MG  TABLETS TAKE 1 TABLET DAILY EXCEPT 1/2 TABLET ON TUES, THURS, & SAT 2)  RECHECK IN 2 WEEKS

## 2010-10-07 NOTE — Medication Information (Signed)
Summary: Prior Authorization for Actonel/[facility]  Prior Authorization for Actonel/[facility]   Imported By: Lanelle Bal 10/24/2009 12:13:21  _____________________________________________________________________  External Attachment:    Type:   Image     Comment:   External Document

## 2010-10-07 NOTE — Progress Notes (Signed)
Summary: fyi Boniva//paz  Phone Note From Pharmacy   [facility]  [address]  [phone]* Call For: Boniva  Summary of Call: Starting September 07 2008 , a maximum of 1 tablet per 28 days will be covered on this members drug list Initial call taken by: Kandice Hams,  September 12, 2008 10:27 AM

## 2010-10-07 NOTE — Progress Notes (Signed)
Summary: lab results  Phone Note Outgoing Call Call back at [facility] Phone [phone]   Summary of Call: LAB RESULTS: advise patient: needs ergocalciferol 50,000 units weekly for 3 months, then over-the-counter vitamin D, 1000 to 1200 units daily Signed by [facility] E. Paz MD on 03/08/2009 at 9:27 AM  advise patient: her sugar continued to be borderline, at this point, she  needs  to focus on diet and exercise her cholesterol is quite elevated, she used to be on Lipitor unclear why she stopped it. Please restart Lipitor 20 mg, if cost was an issue then switched to simvastatin 40 mg daily. Office visit in 3 months Signed by [employer] Paz MD on 03/08/2009 at 9:29 AM   - [facility] for pt to return call Shary Decamp  March 12, 2009 9:59 AM  discussed with pt Shary Decamp  March 12, 2009 10:16 AM      New/Updated Medications: ERGOCALCIFEROL 50000 UNIT CAPS (ERGOCALCIFEROL) 1 by mouth qwk x 12 weeks then start OTC vit D 1200 units daily LIPITOR 20 MG TABS (ATORVASTATIN CALCIUM) 1 by mouth at bedtime   Prescriptions: LIPITOR 20 MG TABS (ATORVASTATIN CALCIUM) 1 by mouth at bedtime  #30 x 3   Entered by:   Shary Decamp   Authorized by:   Nolon Rod. Paz MD   Signed by:   Shary Decamp on 03/12/2009   Method used:   [facility]ronically to        [facility]  [phone]* (retail)       [address] [place], [place]  [address]       Ph: [phone] or [phone]       Fax: [phone]   RxID:   [id number] ERGOCALCIFEROL 50000 UNIT CAPS (ERGOCALCIFEROL) 1 by mouth qwk x 12 weeks then start OTC vit D 1200 units daily  #12 x 0   Entered by:   Shary Decamp   Authorized by:   Nolon Rod. Paz MD   Signed by:   Shary Decamp on 03/12/2009   Method used:   Electronically to        [facility]  [phone]* (retail)       [address] [place], [place]  [address]       Ph: [phone] or [phone]       Fax: [phone]   RxID:   [id number]

## 2010-10-07 NOTE — Letter (Signed)
Summary: Colonoscopy Letter  [facility]  [address] [place], [place] [address]   Phone: [phone]  Fax: [phone]      May 15, 2010 MRN: [id number]   Dawn Morrow [address] [place], [place]  [address]   Dear [name],   According to your medical record, it is time for you to schedule a Colonoscopy. The American Cancer Society recommends this procedure as a method to detect early colon cancer. Patients with a family history of colon cancer, or a personal history of colon polyps or inflammatory bowel disease are at increased risk.  This letter has beeen generated based on the recommendations made at the time of your procedure. If you feel that in your particular situation this may no longer apply, please contact our office.  Please call our office at [phone] to schedule this appointment or to update your records at your earliest convenience.  Thank you for cooperating with [place] to provide you with the very best care possible.   Sincerely,  Judie Petit T. Russella Dar, M.D.  [facility] [phone]

## 2010-10-07 NOTE — Medication Information (Signed)
Summary: Med Therapy Mgmt Program/[facility]  Med Therapy Mgmt Program/[facility]   Imported By: Lanelle Bal 03/18/2010 12:09:39  _____________________________________________________________________  External Attachment:    Type:   Image     Comment:   External Document

## 2010-10-07 NOTE — Assessment & Plan Note (Signed)
Summary: pt check//tl   Nurse Visit   Vital Signs:  Patient Profile:   75 Years Old Female Weight:      181 pounds                 Prior Medications: MACROBID 100 MG CAPS (NITROFURANTOIN MONOHYD MACRO) Take 1 capsule by mouth once a day BUDEPRION XL 300 MG TB24 (BUPROPION HCL)  LEVOTHYROXINE SODIUM 100 MCG TABS (LEVOTHYROXINE SODIUM)  FOSAMAX 70 MG TABS (ALENDRONATE SODIUM)  LISINOPRIL 10 MG TABS (LISINOPRIL) 1 by mouth qd LIPITOR 20 MG TABS (ATORVASTATIN CALCIUM) 1 by mouth qd WARFARIN SODIUM 6 MG TABS (WARFARIN SODIUM)  ALBUTEROL 90 MCG/ACT AERS (ALBUTEROL) 1-2 puffs q4-6h prn DIAZEPAM 2 MG  TABS (DIAZEPAM) 1/2-1 by mouth three times a day prn AZMACORT 75 MCG/ACT  AERS (TRIAMCINOLONE ACETONIDE) 3 puffs bid  Laboratory Results   Blood Tests      INR: 2.0   (Normal Range: 0.88-1.12   Therap INR: 2.0-3.5)     Influenza Vaccine    Vaccine Type: Fluvax MCR    Site: left deltoid    Dose: 0.5 ml    Route: IM    Given by: Shary Decamp    Exp. Date: 03/06/2008    Lot #: [id number]  Flu Vaccine Consent Questions    Do you have a history of severe allergic reactions to this vaccine? no    Any prior history of allergic reactions to egg and/or gelatin? no    Do you have a sensitivity to the preservative Thimersol? no    Do you have a past history of Guillan-Barre Syndrome? no    Do you currently have an acute febrile illness? no    Have you ever had a severe reaction to latex? no    Vaccine information given and explained to patient? yes    Are you currently pregnant? no   Orders Added: 1)  TLB-TSH (Thyroid Stimulating Hormone) [84443-TSH] 2)  Protime [47829FA] 3)  Influenza Vaccine MCR [00025]    ]   ANTICOAGULATION RECORD PREVIOUS REGIMEN & LAB RESULTS Anticoagulation Diagnosis:  PE on  05/31/2007  Previous INR:  3.0 on  05/31/2007 Previous Coumadin Dose(mg):  6mg  on  02/07/2007 Previous Regimen:  1 tablet qd except 1/2 tab m,w,f on  02/07/2007  NEW  REGIMEN & LAB RESULTS Anticoag. Dx: PE Current INR: 2.0 Regimen: 1 tablet qd except 1/2 tab m,w,f  (no change) Coagulation Comments: FORGOT DOSE 06/28/07 Provider: Willow Ora, MD      Repeat testing in: 4 WEEKS MEDICATIONS MACROBID 100 MG CAPS (NITROFURANTOIN MONOHYD MACRO) Take 1 capsule by mouth once a day BUDEPRION XL 300 MG TB24 (BUPROPION HCL)  LEVOTHYROXINE SODIUM 100 MCG TABS (LEVOTHYROXINE SODIUM)  FOSAMAX 70 MG TABS (ALENDRONATE SODIUM)  LISINOPRIL 10 MG TABS (LISINOPRIL) 1 by mouth qd LIPITOR 20 MG TABS (ATORVASTATIN CALCIUM) 1 by mouth qd WARFARIN SODIUM 6 MG TABS (WARFARIN SODIUM)  ALBUTEROL 90 MCG/ACT AERS (ALBUTEROL) 1-2 puffs q4-6h prn DIAZEPAM 2 MG  TABS (DIAZEPAM) 1/2-1 by mouth three times a day prn AZMACORT 75 MCG/ACT  AERS (TRIAMCINOLONE ACETONIDE) 3 puffs bid

## 2010-10-07 NOTE — Letter (Signed)
Summary: New Patient Referral Status Follow Up/[facility]  New Patient Referral Status Follow Up/[facility]   Imported By: Lanelle Bal 06/06/2008 10:37:19  _____________________________________________________________________  External Attachment:    Type:   Image     Comment:   External Document

## 2010-10-07 NOTE — Medication Information (Signed)
Summary: Coumadin Clinic  Anticoagulant Therapy  Managed by: Weston Brass, PharmD Supervising MD: Shirlee Latch MD, Freida Busman Indication 1: Deep Vein Thrombosis - Leg (ICD-451.1) Indication 2: CVA-stroke (ICD-436) Lab Used: [facility] Site: [facility] PT 37.7 INR POC 3.1 INR RANGE 2 - 3  Dietary changes: no    Health status changes: no    Bleeding/hemorrhagic complications: no    Recent/future hospitalizations: no    Any changes in medication regimen? no    Recent/future dental: no  Any missed doses?: no       Is patient compliant with meds? yes       Allergies: 1)  ! Pcn 2)  ! Sulfa  Anticoagulation Management History:      Her anticoagulation is being managed by telephone today.  Positive risk factors for bleeding include an age of 3 years or older and presence of serious comorbidities.  The bleeding index is 'intermediate risk'.  Positive CHADS2 values include History of HTN and History of Diabetes.  Negative CHADS2 values include Age > 68 years old.  The start date was 05/16/2007.  Her last INR was 8.5 RATIO.  Prothrombin time is 37.7.  Anticoagulation responsible provider: Shirlee Latch MD, Emslee Lopezmartinez.  INR POC: 3.1.  Exp: 06/08/2011.    Anticoagulation Management Assessment/Plan:      The patient's current anticoagulation dose is Coumadin 6 mg tabs: Take as directed by coumadin clinic..  The target INR is 2 - 3.  The next INR is due 05/01/2010.  Anticoagulation instructions were given to patient.  Results were reviewed/authorized by Weston Brass, PharmD.  She was notified by Weston Brass PharmD.         Prior Anticoagulation Instructions: INR 3.9  Spoke with pt's caregiver.  Skip today's dose of Coumadin then resume previous dose of 1/2 tablet every day except 1 tablet on Tuesday and Friday.  Recheck INR in 1 week.  Gave orders to [facility] with [facility]  Current Anticoagulation Instructions: INR 3.1  Spoke with pt.  Skip today's dose of Coumadin then continue same dose of 1/2  tablet every day except 1 tablet on Tuesday and Friday. Gave orders to [place] with [facility].

## 2010-10-07 NOTE — Assessment & Plan Note (Signed)
Summary: pt check--PH   Nurse Visit   Vital Signs:  Patient Profile:   75 Years Old Female CC:      pt check Height:     65 inches Weight:      190 pounds BP sitting:   120 / 80  Vitals Entered By: Shary Decamp (May 02, 2008 2:14 PM)                 Prior Medications: MACROBID 100 MG CAPS (NITROFURANTOIN MONOHYD MACRO) Take 1 capsule by mouth once a day BUDEPRION XL 300 MG TB24 (BUPROPION HCL)  LEVOTHYROXINE SODIUM 100 MCG TABS (LEVOTHYROXINE SODIUM)  LISINOPRIL 10 MG TABS (LISINOPRIL) 1 by mouth qd LIPITOR 20 MG TABS (ATORVASTATIN CALCIUM) 1 by mouth qd WARFARIN SODIUM 6 MG TABS (WARFARIN SODIUM)  ALBUTEROL 90 MCG/ACT AERS (ALBUTEROL) 1-2 puffs q4-6h prn DIAZEPAM 2 MG  TABS (DIAZEPAM) 1/2-1 by mouth three times a day prn BONIVA 150 MG  TABS (IBANDRONATE SODIUM) 1 every month QVAR 80 MCG/ACT  AERS (BECLOMETHASONE DIPROPIONATE) 2 puffs bid TRAMADOL HCL 50 MG  TABS (TRAMADOL HCL) 1 by mouth qid as needed pain Current Allergies: ! PCN ! SULFA Laboratory Results   Blood Tests     PT: 1.2 s   (Normal Range: 10.6-13.4)  INR: 13.8   (Normal Range: 0.88-1.12   Therap INR: 2.0-3.5) Comments: CURRENT DOSE: 6 mg tabs -- 1/2 by mouth once daily except 1 on tues & thurs new: 6mg  tab 1/2 a day except 1 a day T-T-S recheck 2 weeks Joao Mccurdy E. Mairi Stagliano MD  May 02, 2008 2:19 PM       Orders Added: 1)  Est. Patient Level I [[address]] 2)  Protime Ila.Stager    ]   Patient Instructions: 1)  1/2 tablet everyday except 1 on tues, thurs, & sat 2)  recheck in 2 weeks

## 2010-10-07 NOTE — Miscellaneous (Signed)
Summary: SN Orders/[facility]  SN Orders/[facility]   Imported By: Lanelle Bal 06/05/2010 10:19:51  _____________________________________________________________________  External Attachment:    Type:   Image     Comment:   External Document

## 2010-10-07 NOTE — Medication Information (Signed)
Summary: rov/sp  Anticoagulant Therapy  Managed by: Cloyde Reams, RN, BSN Supervising MD: Clifton James MD, Cristal Deer Indication 1: Deep Vein Thrombosis - Leg (ICD-451.1) Indication 2: CVA-stroke (ICD-436) Lab Used: [facility] Site: [facility] INR POC 2.5 INR RANGE 2 - 3  Dietary changes: no    Health status changes: yes       Details: c/o new onset of dizziness x 1 week.  Denies any SOB or CP.  Pt denies falling.  Pt will continue to monitor wcb for appt if persists or worsens.    Bleeding/hemorrhagic complications: no    Recent/future hospitalizations: no    Any changes in medication regimen? no    Recent/future dental: no  Any missed doses?: no       Is patient compliant with meds? yes      Comments: 130/72 BP check in office.    Allergies (verified): 1)  ! Pcn 2)  ! Sulfa  Anticoagulation Management History:      The patient is taking warfarin and comes in today for a routine follow up visit.  Positive risk factors for bleeding include an age of 75 years or older and presence of serious comorbidities.  The bleeding index is 'intermediate risk'.  Positive CHADS2 values include History of HTN and History of Diabetes.  Negative CHADS2 values include Age > 57 years old.  The start date was 05/16/2007.  Her last INR was 8.5 RATIO.  Anticoagulation responsible provider: Clifton James MD, Cristal Deer.  INR POC: 2.5.  Cuvette Lot#: [id number].  Exp: 01/2011.    Anticoagulation Management Assessment/Plan:      The patient's current anticoagulation dose is Coumadin 6 mg tabs: Take as directed by coumadin clinic..  The target INR is 2 - 3.  The next INR is due 01/07/2010.  Anticoagulation instructions were given to patient.  Results were reviewed/authorized by Cloyde Reams, RN, BSN.  She was notified by Cloyde Reams RN.         Prior Anticoagulation Instructions: INR 1.4 Today take extra 1/2 pill then change dose to 1/2 pill everyday except 1 pill on Tuesdays and Fridays. Recheck in 2  weeks.   Current Anticoagulation Instructions: INR 2.5  Continue on same dosage 1/2 tablet daily except 1 tablet on Tuesdays and Fridays.  Recheck in 3 weeks.

## 2010-10-07 NOTE — Medication Information (Signed)
Summary: [facility]  Anticoagulant Therapy  Managed by: Cloyde Reams, RN, BSN Supervising MD: Ladona Ridgel MD, Sharlot Gowda Indication 1: Deep Vein Thrombosis - Leg (ICD-451.1) Indication 2: CVA-stroke (ICD-436) Lab Used: [facility] Site: [facility] INR POC 3.7 INR RANGE 2 - 3  Dietary changes: yes       Details: decr in green leafy vegs  Health status changes: no    Bleeding/hemorrhagic complications: no    Recent/future hospitalizations: no    Any changes in medication regimen? no    Recent/future dental: no  Any missed doses?: no       Is patient compliant with meds? yes       Current Medications (verified): 1)  Budeprion Xl 300 Mg Tb24 (Bupropion Hcl) .Marland Kitchen.. 1 By Mouth Once Daily 2)  Levothyroxine Sodium 100 Mcg Tabs (Levothyroxine Sodium) .Marland Kitchen.. 1 By Mouth Once Daily 3)  Lisinopril 10 Mg Tabs (Lisinopril) .Marland Kitchen.. 1 By Mouth Qd 4)  Boniva 150 Mg  Tabs (Ibandronate Sodium) .Marland Kitchen.. 1 Every Month 5)  Qvar 80 Mcg/act  Aers (Beclomethasone Dipropionate) .... 2 Puffs Bid 6)  Cleocin-T 1 % Lotn (Clindamycin Phosphate) .... Apply To The Face Two Times A Day 7)  Coumadin 6 Mg Tabs (Warfarin Sodium) .... Take As Directed By Coumadin Clinic. 8)  Ergocalciferol 50000 Unit Caps (Ergocalciferol) .Marland Kitchen.. 1 By Mouth Qwk X 12 Weeks Then Start Otc Vit D 1200 Units Daily 9)  Lipitor 20 Mg Tabs (Atorvastatin Calcium) .Marland Kitchen.. 1 By Mouth At Bedtime  Allergies (verified): 1)  ! Pcn 2)  ! Sulfa  Anticoagulation Management History:      The patient is taking warfarin and comes in today for a routine follow up visit.  Positive risk factors for bleeding include an age of 73 years or older.  The bleeding index is 'intermediate risk'.  Positive CHADS2 values include History of HTN.  Negative CHADS2 values include Age > 6 years old.  The start date was 05/16/2007.  Her last INR was 8.5 RATIO.  Anticoagulation responsible provider: Ladona Ridgel MD, Sharlot Gowda.  INR POC: 3.7.  Cuvette Lot#: [id number].  Exp: 06/07/2010.     Anticoagulation Management Assessment/Plan:      The patient's current anticoagulation dose is Coumadin 6 mg tabs: Take as directed by coumadin clinic..  The target INR is 2 - 3.  The next INR is due 05/09/2009.  Anticoagulation instructions were given to patient.  Results were reviewed/authorized by Cloyde Reams, RN, BSN.  She was notified by Cloyde Reams RN.         Prior Anticoagulation Instructions: same dose  Current Anticoagulation Instructions: INR 3.7  Skip tomorrow's dose of coumadin then resume 1/2 tablet daily except 1 tablet on Wednesdays, Thursdays, and Fridays.  Recheck in 3 weeks.

## 2010-10-07 NOTE — Progress Notes (Signed)
Summary: PRIOR AUTHORIZATION APPROVED FOR BUDEPRION  Phone Note Refill Request Message from:  [facility] [address] FAX [phone]  Refills Requested: Medication #1:  BUDEPRION XL 300 MG TB24 1 by mouth once daily PRIOR AUTHORIZATION [phone]  Initial call taken by: Barb Merino,  May 21, 2009 8:30 AM  Follow-up for Phone Call        prior auth APPROVED, PT NEW TO PLAN PUT IN OVERRIDE OK TO PROCESS, [facility] Follow-up by: Kandice Hams,  May 22, 2009 3:02 PM

## 2010-10-07 NOTE — Miscellaneous (Signed)
Summary: Missed Visit/[facility]  Missed Visit/[facility]   Imported By: Lanelle Bal 06/09/2010 08:40:26  _____________________________________________________________________  External Attachment:    Type:   Image     Comment:   External Document

## 2010-10-07 NOTE — Miscellaneous (Signed)
Summary: [facility] Supplemental Order   [facility] Supplemental Order   Imported By: Roderic Ovens 07/10/2010 11:26:32  _____________________________________________________________________  External Attachment:    Type:   Image     Comment:   External Document

## 2010-10-07 NOTE — Letter (Signed)
Summary: Encounter Notice/[facility]  Encounter Notice/[facility]   Imported By: Lanelle Bal 04/22/2010 10:26:57  _____________________________________________________________________  External Attachment:    Type:   Image     Comment:   External Document

## 2010-10-09 NOTE — Progress Notes (Signed)
Summary: Handi-Sticker   Phone Note Call from Patient   Caller: Patient Summary of Call: Pt's Handicapped  sticker is expired. This is for a perm. sticker. [phone] Initial call taken by: Lavell Islam,  September 12, 2010 2:19 PM  Follow-up for Phone Call        Okay to make pt a new handicap sticker?  Follow-up by: [name] CMA,  September 12, 2010 2:46 PM  Additional Follow-up for Phone Call Additional follow up Details #1::        Please arrange a permanent sticker Additional Follow-up by: [facility] E. Ralonda Tartt MD,  September 12, 2010 5:04 PM    Additional Follow-up for Phone Call Additional follow up Details #2::    left message w/ a female to call back- Handicap sticker will be ready by 4pm today. [name] CMA  September 15, 2010 1:45 PM  Patient notified it is ready and up front for pickup. Lucious Groves CMA  September 15, 2010 4:43 PM

## 2010-10-09 NOTE — Letter (Signed)
Summary: Unable To Reach-Consult Scheduled  [place] [facility] [place]  [address] [place], [place] [address]   Phone: [phone]  Fax: [phone]    08/18/2010 MRN: [id number]    Dear Ms. Briel,   We have been unable to reach you by phone.  Please contact our office with an updated phone number.    Thank you,  [name] CMA  August 18, 2010 4:40 PM

## 2010-10-09 NOTE — Medication Information (Signed)
Summary: rov/ewj   Anticoagulant Therapy  Managed by: Samantha Crimes, PharmD Supervising MD: Clifton James MD, Cristal Deer Indication 1: Deep Vein Thrombosis - Leg (ICD-451.1) Indication 2: CVA-stroke (ICD-436) Lab Used: [facility] [place] Site: [facility] INR POC 1.2 INR RANGE 2 - 3  Dietary changes: no    Health status changes: no    Bleeding/hemorrhagic complications: no    Recent/future hospitalizations: no    Any changes in medication regimen? no     Any missed doses?: yes     Details: Missed a few doses.  Not sure when she missed them.  Is patient compliant with meds? yes       Allergies: 1)  ! Pcn 2)  ! Sulfa  Anticoagulation Management History:      The patient is taking warfarin and comes in today for a routine follow up visit.  Positive risk factors for bleeding include an age of 75 years or older and presence of serious comorbidities.  The bleeding index is 'intermediate risk'.  Positive CHADS2 values include History of HTN and History of Diabetes.  Negative CHADS2 values include Age > 29 years old.  The start date was 05/16/2007.  Her last INR was 1.7.  Anticoagulation responsible provider: Clifton James MD, Cristal Deer.  INR POC: 1.2.  Cuvette Lot#: [id number].  Exp: 08/08/2011.    Anticoagulation Management Assessment/Plan:      The patient's current anticoagulation dose is Coumadin 6 mg tabs: Take as directed by coumadin clinic..  The target INR is 2 - 3.  The next INR is due 10/13/2010.  Anticoagulation instructions were given to patient.  Results were reviewed/authorized by Samantha Crimes, PharmD.  She was notified by Linward Headland, PharmD candidate.         Prior Anticoagulation Instructions: INR 2.8 Continue taking 1 tablet on monday, wednesday, and friday. And a half tablet all other days. Recheck in 4 weeks.   Current Anticoagulation Instructions: INR 1.2 (INR goal: 2-3)  Take 1 and 1/2 tablets today.  Resume normal schedule tomorrow of 1/2 tablet everyday except  1 tablet on Mondays, Wednesdays, and Fridays.  Recheck in 2 weeks.

## 2010-10-09 NOTE — Assessment & Plan Note (Signed)
Summary: BOTH KNEES HURTING, TROUBLE WALKING//[facility]   Vital Signs:  Patient profile:   75 year old female Weight:      201.38 pounds Pulse rate:   90 / minute Pulse rhythm:   regular BP sitting:   134 / 88  (left arm) Cuff size:   large  Vitals Entered By: [name] CMA (September 12, 2010 1:23 PM) CC: Pt here c/o (L) knee pain, and both hips hurt. Comments feel swelling at night fasting [facility]ill [facility]    History of Present Illness: several months history of left >> right knee pain She was seen 11/11, prescribed Vicodin which is helping and not causing side effects. X-rays were essentially negative She admits to occasional swelling. Then pain is in the whole anterior aspect of the left knee.  Review of systems Occasional feels a catch in the knee No fevers No recent falls or injuries Back pain at baseline  Current Medications (verified): 1)  Budeprion Xl 300 Mg Tb24 (Bupropion Hcl) .Marland Kitchen.. 1 By Mouth Once Daily 2)  Levothroid 137 Mcg Tabs (Levothyroxine Sodium) .Marland Kitchen.. 1 By Mouth Daily 3)  Lisinopril 10 Mg Tabs (Lisinopril) .Marland Kitchen.. 1 By Mouth Qd 4)  Actonel 150 Mg Tabs (Risedronate Sodium) .Marland Kitchen.. 1 By Mouth One Time A Month - Stop Boniva 5)  Qvar 40 Mcg/act Aers (Beclomethasone Dipropionate) .... Bid 6)  Coumadin 6 Mg Tabs (Warfarin Sodium) .... Take As Directed By Coumadin Clinic. 7)  Lipitor 20 Mg Tabs (Atorvastatin Calcium) .Marland Kitchen.. 1 By Mouth At Bedtime 8)  One Touch Delica Lancets  Misc (Lancets) .... Checks Blood Sugar 1x Day, Dx 250.00 9)  Prodigy Blood Glucose Test  Strp (Glucose Blood) .... Check Bs 1x Daily, Dx 250.00 10)  Ventolin Hfa 108 (90 Base) Mcg/act Aers (Albuterol Sulfate) .... 2 Puffs Every 4 Hours As Needed For Shortness of Breath or Wheezing 11)  Coreg 12.5 Mg Tabs (Carvedilol) .... Take 1 By Mouth Daily 12)  Antivert 25 Mg Tabs (Meclizine Hcl) .Marland Kitchen.. 1 By Mouth Daily At Bedtime. 13)  Prodigy Preferred Monitor W/device Kit (Blood Glucose  Monitoring Suppl) .... As Directed. 14)  Prodigy Lancing Device  Misc Occupational hygienist Devices) .... Two Times A Day 15)  Prodigy Blood Glucose Test  Strp (Glucose Blood) .... Two Times A Day 16)  Vitamin D3 2000 Unit Caps (Cholecalciferol) .... Qd 17)  Hydrocodone-Acetaminophen 2.5-500 Mg Tabs (Hydrocodone-Acetaminophen) .... One or 2 By Mouth Every 6 Hours As Needed For Pain  Allergies (verified): 1)  ! Pcn 2)  ! Sulfa  Past History:  Past Medical History: Reviewed history from 08/06/2010 and no changes required. AODM Asthma, COPD Depression DVT, hx of x multiple, on coumadin  Pulmonary embolism, hx of  x 2  GERD Hypertension increased homocysreine levels Osteopenia hypothyroidism Hyperlipidemia Osteoarthritis (h/o spinal stenosis by MRI 2009, ongoing back pain)  Past Surgical History: Reviewed history from 05/16/2008 and no changes required. Appendectomy Cholecystectomy Lumbar fusion,  in the 90s Tubal ligation Tonsillectomy  Social History: Reviewed history from 02/27/2009 and no changes required. Single has a boyfriend 2 kids lives by self doesn't drive sister handles all her financial affairs  tobacco--no currently a smoker ETOH-- socially   Physical Exam  General:  alert and well-developed.   Msk:  date somehow unsteady, uses a cane Extremities:  knees without effusion or redness. They do have some puffiness frontally. Range of motion is normal, no crepitus    Impression & Recommendations:  Problem # 1:  OSTEOARTHRITIS (ICD-715.90)  persistent L>R knee pain Refer to orthopedic surgery RF hydrocodone which helps w/o apparent s/e  Her updated medication list for this problem includes:    Hydrocodone-acetaminophen 2.5-500 Mg Tabs (Hydrocodone-acetaminophen) ..... One or 2 by mouth every 6 hours as needed for pain  Orders: Orthopedic Referral (Ortho)  Complete Medication List: 1)  Budeprion Xl 300 Mg Tb24 (Bupropion hcl) .Marland Kitchen.. 1 by mouth once daily 2)   Levothroid 137 Mcg Tabs (Levothyroxine sodium) .Marland Kitchen.. 1 by mouth daily 3)  Lisinopril 10 Mg Tabs (Lisinopril) .Marland Kitchen.. 1 by mouth qd 4)  Actonel 150 Mg Tabs (Risedronate sodium) .Marland Kitchen.. 1 by mouth one time a month - stop boniva 5)  Qvar 40 Mcg/act Aers (Beclomethasone dipropionate) .... Bid 6)  Coumadin 6 Mg Tabs (Warfarin sodium) .... Take as directed by coumadin clinic. 7)  Lipitor 20 Mg Tabs (Atorvastatin calcium) .Marland Kitchen.. 1 by mouth at bedtime 8)  One Touch Delica Lancets Misc (Lancets) .... Checks blood sugar 1x day, dx 250.00 9)  Prodigy Blood Glucose Test Strp (Glucose blood) .... Check bs 1x daily, dx 250.00 10)  Ventolin Hfa 108 (90 Base) Mcg/act Aers (Albuterol sulfate) .... 2 puffs every 4 hours as needed for shortness of breath or wheezing 11)  Coreg 12.5 Mg Tabs (Carvedilol) .... Take 1 by mouth daily 12)  Antivert 25 Mg Tabs (Meclizine hcl) .Marland Kitchen.. 1 by mouth daily at bedtime. 13)  Prodigy Preferred Monitor W/device Kit (Blood glucose monitoring suppl) .... As directed. 14)  Prodigy Lancing Device Misc (Lancet devices) .... Two times a day 15)  Prodigy Blood Glucose Test Strp (Glucose blood) .... Two times a day 16)  Vitamin D3 2000 Unit Caps (Cholecalciferol) .... Qd 17)  Hydrocodone-acetaminophen 2.5-500 Mg Tabs (Hydrocodone-acetaminophen) .... One or 2 by mouth every 6 hours as needed for pain  Patient Instructions: 1)  Please schedule a follow-up appointment in 2 months, fasting physical exam  Prescriptions: HYDROCODONE-ACETAMINOPHEN 2.5-500 MG TABS (HYDROCODONE-ACETAMINOPHEN) one or 2 by mouth every 6 hours as needed for pain  #100 x 0   Entered and Authorized by:   Elita Quick E. Bellanie Matthew MD   Signed by:   Nolon Rod. Phillippe Orlick MD on 09/12/2010   Method used:   Print then Give to Patient   RxID:   [id number]    Orders Added: 1)  Orthopedic Referral [Ortho] 2)  Est. Patient Level III [65784]

## 2010-10-13 ENCOUNTER — Encounter (INDEPENDENT_AMBULATORY_CARE_PROVIDER_SITE_OTHER): Payer: Medicare Other

## 2010-10-13 ENCOUNTER — Telehealth: Payer: Self-pay | Admitting: Internal Medicine

## 2010-10-13 ENCOUNTER — Encounter: Payer: Self-pay | Admitting: Cardiology

## 2010-10-13 DIAGNOSIS — Z7901 Long term (current) use of anticoagulants: Secondary | ICD-10-CM

## 2010-10-13 DIAGNOSIS — I80299 Phlebitis and thrombophlebitis of other deep vessels of unspecified lower extremity: Secondary | ICD-10-CM

## 2010-10-13 LAB — CONVERTED CEMR LAB: POC INR: 1.3

## 2010-10-17 ENCOUNTER — Encounter: Payer: Self-pay | Admitting: Internal Medicine

## 2010-10-20 ENCOUNTER — Encounter: Payer: Self-pay | Admitting: Cardiology

## 2010-10-23 NOTE — Progress Notes (Signed)
Summary: Refill Request  Phone Note Refill Request Call back at [phone] Message from:  Pharmacy on October 13, 2010 3:56 PM  Refills Requested: Medication #1:  HYDROCODONE-ACETAMINOPHEN 2.5-500 MG TABS one or 2 by mouth every 6 hours as needed for pain.   Dosage confirmed as above?Dosage Confirmed   Supply Requested: 100   [facility] [address]   Next Appointment Scheduled: 3.5.12 Initial call taken by: Harold Barban,  October 13, 2010 3:56 PM  Follow-up for Phone Call        ok 100, no RF Jose E. Paz MD  October 14, 2010 1:12 PM     Prescriptions: HYDROCODONE-ACETAMINOPHEN 2.5-500 MG TABS (HYDROCODONE-ACETAMINOPHEN) one or 2 by mouth every 6 hours as needed for pain  #100 x 0   Entered by:   [name] CMA   Authorized by:   Nolon Rod. Paz MD   Signed by:   [name] CMA on 10/14/2010   Method used:   Print[facility] then [facility] ...       [facility] [address] [phone]* (retail)       [address]       [place], [place]  [address]       Ph: [phone] or [phone]       Fax: [phone]   RxID:   [id number]

## 2010-10-23 NOTE — Medication Information (Signed)
Summary: Coumadin Clinic   Anticoagulant Therapy  Managed by: Weston Brass, PharmD Supervising MD: Jens Som MD, Arlys John Indication 1: Deep Vein Thrombosis - Leg (ICD-451.1) Indication 2: CVA-stroke (ICD-436) Lab Used: [facility] [place] Site: [facility] INR POC 1.3 INR RANGE 2 - 3  Dietary changes: no    Health status changes: no    Bleeding/hemorrhagic complications: no    Recent/future hospitalizations: no    Any changes in medication regimen? no    Recent/future dental: no  Any missed doses?: yes     Details: may have missed some but unsure if so and how many  Is patient compliant with meds? yes       Allergies: 1)  ! Pcn 2)  ! Sulfa  Anticoagulation Management History:      The patient is taking warfarin and comes in today for a routine follow up visit.  Positive risk factors for bleeding include an age of 73 years or older and presence of serious comorbidities.  The bleeding index is 'intermediate risk'.  Positive CHADS2 values include History of HTN and History of Diabetes.  Negative CHADS2 values include Age > 80 years old.  The start date was 05/16/2007.  Her last INR was 1.7.  Anticoagulation responsible provider: Jens Som MD, Arlys John.  INR POC: 1.3.  Cuvette Lot#: [id number].  Exp: 09/2011.    Anticoagulation Management Assessment/Plan:      The patient's current anticoagulation dose is Coumadin 6 mg tabs: Take as directed by coumadin clinic..  The target INR is 2 - 3.  The next INR is due 10/23/2010.  Anticoagulation instructions were given to patient.  Results were reviewed/authorized by Weston Brass, PharmD.  She was notified by Weston Brass PharmD.         Prior Anticoagulation Instructions: INR 1.2 (INR goal: 2-3)  Take 1 and 1/2 tablets today.  Resume normal schedule tomorrow of 1/2 tablet everyday except 1 tablet on Mondays, Wednesdays, and Fridays.  Recheck in 2 weeks.    Current Anticoagulation Instructions: INR 1.3  Increase dose to 1 tablet every day except  1/2 tablet on Sunday and Thursday.  Recheck INR in 10 days.

## 2010-10-27 DIAGNOSIS — I2699 Other pulmonary embolism without acute cor pulmonale: Secondary | ICD-10-CM

## 2010-10-27 DIAGNOSIS — I82409 Acute embolism and thrombosis of unspecified deep veins of unspecified lower extremity: Secondary | ICD-10-CM

## 2010-10-27 DIAGNOSIS — Z7901 Long term (current) use of anticoagulants: Secondary | ICD-10-CM

## 2010-10-29 NOTE — Letter (Signed)
Summary: back pain, RX MRI, consider injection (off coumadin)--ortho  [facility]   Imported By: Maryln Gottron 10/21/2010 09:36:15  _____________________________________________________________________  External Attachment:    Type:   Image     Comment:   External Document

## 2010-10-29 NOTE — Letter (Signed)
Summary: Rx MRI, local injection??--- [facility]  [facility]   Imported By: Maryln Gottron 10/17/2010 14:13:51  _____________________________________________________________________  External Attachment:    Type:   Image     Comment:   External Document

## 2010-11-10 ENCOUNTER — Encounter: Payer: Self-pay | Admitting: Internal Medicine

## 2010-11-10 ENCOUNTER — Encounter (INDEPENDENT_AMBULATORY_CARE_PROVIDER_SITE_OTHER): Payer: Medicare Other | Admitting: Internal Medicine

## 2010-11-10 ENCOUNTER — Other Ambulatory Visit: Payer: Self-pay | Admitting: Internal Medicine

## 2010-11-10 DIAGNOSIS — I10 Essential (primary) hypertension: Secondary | ICD-10-CM

## 2010-11-10 DIAGNOSIS — Z136 Encounter for screening for cardiovascular disorders: Secondary | ICD-10-CM

## 2010-11-10 DIAGNOSIS — E119 Type 2 diabetes mellitus without complications: Secondary | ICD-10-CM

## 2010-11-10 DIAGNOSIS — E039 Hypothyroidism, unspecified: Secondary | ICD-10-CM

## 2010-11-10 DIAGNOSIS — Z0000 Encounter for general adult medical examination without abnormal findings: Secondary | ICD-10-CM

## 2010-11-10 DIAGNOSIS — M810 Age-related osteoporosis without current pathological fracture: Secondary | ICD-10-CM

## 2010-11-10 DIAGNOSIS — E785 Hyperlipidemia, unspecified: Secondary | ICD-10-CM

## 2010-11-10 DIAGNOSIS — R9431 Abnormal electrocardiogram [ECG] [EKG]: Secondary | ICD-10-CM | POA: Insufficient documentation

## 2010-11-10 LAB — HEMOGLOBIN A1C: Hgb A1c MFr Bld: 6.4 % (ref 4.6–6.5)

## 2010-11-10 LAB — LIPID PANEL
Cholesterol: 215 mg/dL — ABNORMAL HIGH (ref 0–200)
HDL: 45.5 mg/dL (ref >39.00)
Total CHOL/HDL Ratio: 5
Triglycerides: 105 mg/dL (ref 0.0–149.0)
VLDL: 21 mg/dL (ref 0.0–40.0)

## 2010-11-10 LAB — TSH: TSH: 0.38 u[IU]/mL (ref 0.35–5.50)

## 2010-11-10 LAB — LDL CHOLESTEROL, DIRECT: Direct LDL: 163 mg/dL

## 2010-11-10 LAB — AST: AST: 16 U/L (ref 0–37)

## 2010-11-10 LAB — ALT: ALT: 14 U/L (ref 0–35)

## 2010-11-13 ENCOUNTER — Telehealth: Payer: Self-pay | Admitting: Internal Medicine

## 2010-11-13 NOTE — Letter (Signed)
Summary: LBP, Rx local injection------[facility]   [facility]   Imported By: Maryln Gottron 10/30/2010 08:19:32  _____________________________________________________________________  External Attachment:    Type:   Image     Comment:   External Document

## 2010-11-14 ENCOUNTER — Encounter: Payer: Self-pay | Admitting: Internal Medicine

## 2010-11-18 ENCOUNTER — Other Ambulatory Visit: Payer: Self-pay | Admitting: Internal Medicine

## 2010-11-18 DIAGNOSIS — Z7901 Long term (current) use of anticoagulants: Secondary | ICD-10-CM | POA: Insufficient documentation

## 2010-11-18 DIAGNOSIS — I82409 Acute embolism and thrombosis of unspecified deep veins of unspecified lower extremity: Secondary | ICD-10-CM | POA: Insufficient documentation

## 2010-11-18 DIAGNOSIS — Z1231 Encounter for screening mammogram for malignant neoplasm of breast: Secondary | ICD-10-CM

## 2010-11-18 DIAGNOSIS — I2699 Other pulmonary embolism without acute cor pulmonale: Secondary | ICD-10-CM | POA: Insufficient documentation

## 2010-11-18 NOTE — Progress Notes (Signed)
Summary: EKG  Phone Note From Other Clinic   Summary of Call: Whitney from Cardiology called and stated that Dr. Ladona Ridgel read  the EKG was not A-Fib it was Normal sinus w/ PAC's. She would need a new Cardiologly Consult.  Initial call taken by: [name] CMA,  November 13, 2010 2:13 PM  Follow-up for Phone Call        consult sent [place] E. Paz MD  November 13, 2010 7:30 PM

## 2010-11-18 NOTE — Progress Notes (Signed)
Summary: dr Drue Novel requesting dr Graciela Husbands read pt's ekg- REVIEW EKG**   Phone Note From Other Clinic   Caller: dr paz's office Summary of Call: dr Drue Novel is requesting that dr Graciela Husbands read pt's ekg-not here -to send to dod Initial call taken by: Glynda Jaeger,  November 13, 2010 1:18 PM  Follow-up for Phone Call        Dr. Graciela Husbands is out of the office. I will have Dr. Ladona Ridgel review EKG. Dr. Ladona Ridgel has reviewed EKG. EKG shows NSR with freq. PACs. I called & spoke with Dr. Leta Jungling assist and she is aware. Whitney Maeola Sarah RN  November 13, 2010 1:40 PM  Follow-up by: Whitney Maeola Sarah RN,  November 13, 2010 1:40 PM

## 2010-11-18 NOTE — Assessment & Plan Note (Signed)
Summary: cpx/kn   Vital Signs:  Patient profile:   75 year old female Height:      65 inches Weight:      189.38 pounds BMI:     31.63 Pulse rate:   85 / minute Pulse rhythm:   regular BP sitting:   130 / 86  (left arm) Cuff size:   large  Vitals Entered By: [name] CMA (November 10, 2010 1:29 PM) CC: CPX, fasting  Comments no concerns not had PAP or Mammo- doesnt want PAP [facility]    History of Present Illness: Here for Medicare AWV:  1.   Risk factors based on Past M, S, F history: reviewed  2.   Physical Activities: not very active except for home chores  3.   Depression/mood: no problems reported or noted  4.   Hearing: no problems reported or noted  5.   ADL's: independent on most ADL, sister takes care of finances  6.   Fall Risk: increased risk, h/o tremors, counsled about prevention 7.   Home Safety: does feel safe at home  8.   Height, weight, &visual acuity: see VS, vision ok w/ glasses  9.   Counseling: yes  10.   Labs ordered based on risk factors: yes  11.           Referral Coordination, if needed  12.           Care Plan, see a/p  13.            Cognitive Assessment:  her balance is somehow impaired due to tremors. her cognition  is at               baseline today , she receives a lot of help from her sister   in addition, we discussed the following issues --has "a place"  in the R breast, nipple ,saw some pus a while back and  still has a scab  --one of her  varicose veins is hard to touch  for few weeks, no redness or discharge. --AODM, rarely sees her  sugar in the 200s range ; diet control only --DVT, hx of x multiple, on coumadin-- good medication compliance  --Hypertension-- no  ambulatory BPs  --osteoporosis-- good medication compliance, follows precautions w/ actonel; on Ca and Vit D --Osteoarthritis -- still having knee problems , f/u by ortho closely   Preventive Screening-Counseling & Management  Caffeine-Diet-Exercise     Does  Patient Exercise: no  Current Medications (verified): 1)  Budeprion Xl 300 Mg Tb24 (Bupropion Hcl) .Marland Kitchen.. 1 By Mouth Once Daily 2)  Levothroid 137 Mcg Tabs (Levothyroxine Sodium) .Marland Kitchen.. 1 By Mouth Daily. Needs Tsh Checked in 1 Month. 3)  Lisinopril 10 Mg Tabs (Lisinopril) .Marland Kitchen.. 1 By Mouth Qd 4)  Actonel 150 Mg Tabs (Risedronate Sodium) .Marland Kitchen.. 1 By Mouth One Time A Month - Stop Boniva 5)  Qvar 40 Mcg/act Aers (Beclomethasone Dipropionate) .... Bid 6)  Coumadin 6 Mg Tabs (Warfarin Sodium) .... Take As Directed By Coumadin Clinic. 7)  Lipitor 20 Mg Tabs (Atorvastatin Calcium) .Marland Kitchen.. 1 By Mouth At Bedtime 8)  One Touch Delica Lancets  Misc (Lancets) .... Checks Blood Sugar 1x Day, Dx 250.00 9)  Prodigy Blood Glucose Test  Strp (Glucose Blood) .... Check Bs 1x Daily, Dx 250.00 10)  Ventolin Hfa 108 (90 Base) Mcg/act Aers (Albuterol Sulfate) .... 2 Puffs Every 4 Hours As Needed For Shortness of Breath or Wheezing 11)  Coreg 12.5 Mg  Tabs (Carvedilol) .... Take 1 By Mouth Daily 12)  Antivert 25 Mg Tabs (Meclizine Hcl) .Marland Kitchen.. 1 By Mouth Daily At Bedtime. 13)  Prodigy Preferred Monitor W/device Kit (Blood Glucose Monitoring Suppl) .... As Directed. 14)  Prodigy Lancing Device  Misc Occupational hygienist Devices) .... Two Times A Day 15)  Prodigy Blood Glucose Test  Strp (Glucose Blood) .... Two Times A Day 16)  Vitamin D3 2000 Unit Caps (Cholecalciferol) .... Qd 17)  Hydrocodone-Acetaminophen 2.5-500 Mg Tabs (Hydrocodone-Acetaminophen) .... One or 2 By Mouth Every 6 Hours As Needed For Pain  Allergies (verified): 1)  ! Pcn 2)  ! Sulfa  Past History:  Past Medical History: Reviewed history from 08/06/2010 and no changes required. AODM Asthma, COPD Depression DVT, hx of x multiple, on coumadin  Pulmonary embolism, hx of  x 2  GERD Hypertension increased homocysreine levels Osteopenia hypothyroidism Hyperlipidemia Osteoarthritis (h/o spinal stenosis by MRI 2009, ongoing back pain)  Past Surgical  History: Reviewed history from 05/16/2008 and no changes required. Appendectomy Cholecystectomy Lumbar fusion,  in the 90s Tubal ligation Tonsillectomy  Family History: colon ca--  biological M breast ca-- no but patient not sure  DM-- M CAD-- no  Social History: Single, has a boyfriend 3   kids lives by self doesn't drive sister handles all her financial affairs  tobacco--no currently a smoker ETOH-- socially Does Patient Exercise:  no  Review of Systems CV:  Denies chest pain or discomfort and swelling of feet. Resp:  Denies cough and shortness of breath. GI:  Denies bloody stools, nausea, and vomiting.  Physical Exam  General:  alert, well-developed, and overweight-appearing.   Neck:   no thyromegaly, normal carotid pulses Breasts:  No mass, nodules, thickening, tenderness, bulging, retraction, inflamation, nipple discharge or skin changes noted.   she does have 1 mm scab at the right nipple. no lymphadenopathies Lungs:  normal respiratory effort, no intercostal retractions, and no accessory muscle use.  slightly decreased breath sounds Heart:  normal rate, regular rhythm, no murmur, and no gallop.   Abdomen:  soft, non-tender, no distention, no masses, no guarding, and no rigidity.   Extremities:   no peripheral edema  she has multiple varicose veins in the lower extremities, she has one on the medial aspect of the left  leg proximaly  that is hard , there is no redness, warmth tenderness or swelling.   Impression & Recommendations:  Problem # 1:  HEALTH SCREENING (ICD-V70.0) chart reviewed Td 09 Pneumovax  2005 and 2010  shingles shot--  benefits discussed and prescription provided  Pap Smear:  normal (09/13/2007), no h/o abnormal PAPs; declines PAP today, will discuss again next year Mammogram: neg 7-10, ordering one today SBE encouraged  she has a scab on the right nipple, she will let me know if that is not going away soon   Cscope 07-08-05 colon polyp, int  hemorroids, TICs, Bx tubular adenomas---  Due for a repeat colonoscopy, benefits discussed , states she will call gi  counseled about diet-exercise   Orders: Radiology Referral (Radiology) Medicare -1st Annual Wellness Visit [phone])  Problem # 2:  SENILE OSTEOPOROSIS (ICD-733.01)  last bone density test was 10-14-09 Tscore L hip decreased from-2.0 to -2.6 she was  on boniva ----> switched  to actonel   reasses w dexa ~ 10-2011  Her updated medication list for this problem includes:    Actonel 150 Mg Tabs (Risedronate sodium) .Marland Kitchen... 1 by mouth one time a month - stop boniva    Vitamin  D3 2000 Unit Caps (Cholecalciferol) ..... Qd  Problem # 3:  HYPERTENSION (ICD-401.9) BP at goal  EKG---------------------------d/w cards--Normal sinus w/ PAC's. will need a cards consult   Her updated medication list for this problem includes:    Lisinopril 10 Mg Tabs (Lisinopril) .Marland Kitchen... 1 by mouth qd    Coreg 12.5 Mg Tabs (Carvedilol) .Marland Kitchen... Take 1 by mouth daily  Orders: EKG w/ Interpretation (93000) Cardiology Referral (Cardiology)  BP today: 130/86 Prior BP: 134/88 (09/12/2010)  Labs Reviewed: K+: 4.5 (08/07/2010) Creat: : 1.2 (08/07/2010)   Chol: 197 (08/07/2010)   HDL: 39.90 (08/07/2010)   LDL: 130 (08/07/2010)   TG: 134.0 (08/07/2010)  Problem # 4:  varocose vein  no evidence of phlebitis. Observation  Problem # 5:  HYPOTHYROIDISM (ICD-244.9) labs  Her updated medication list for this problem includes:    Levothroid 137 Mcg Tabs (Levothyroxine sodium) .Marland Kitchen... 1 by mouth daily. needs tsh checked in 1 month.  Orders: Venipuncture (60454) TLB-TSH (Thyroid Stimulating Hormone) (84443-TSH) Specimen Handling (09811)  Labs Reviewed: TSH: 0.62 (08/07/2010)    HgBA1c: 6.3 (08/07/2010) Chol: 197 (08/07/2010)   HDL: 39.90 (08/07/2010)   LDL: 130 (08/07/2010)   TG: 134.0 (08/07/2010)  Problem # 6:  DIABETES MELLITUS, TYPE II (ICD-250.00) labs  Her updated medication list for this problem  includes:    Lisinopril 10 Mg Tabs (Lisinopril) .Marland Kitchen... 1 by mouth qd  Orders: TLB-A1C / Hgb A1C (Glycohemoglobin) (83036-A1C) Specimen Handling (91478)  Labs Reviewed: Creat: 1.2 (08/07/2010)     Last Eye Exam: normal (12/05/2009) Reviewed HgBA1c results: 6.3 (08/07/2010)  6.2 (01/27/2010)  Problem # 7:  in addition to her CPX we spent > 25 minutes discussing other issues,   see history of present illness, see assessment and plan.  Complete Medication List: 1)  Budeprion Xl 300 Mg Tb24 (Bupropion hcl) .Marland Kitchen.. 1 by mouth once daily 2)  Levothroid 137 Mcg Tabs (Levothyroxine sodium) .Marland Kitchen.. 1 by mouth daily. needs tsh checked in 1 month. 3)  Lisinopril 10 Mg Tabs (Lisinopril) .Marland Kitchen.. 1 by mouth qd 4)  Actonel 150 Mg Tabs (Risedronate sodium) .Marland Kitchen.. 1 by mouth one time a month - stop boniva 5)  Qvar 40 Mcg/act Aers (Beclomethasone dipropionate) .... Bid 6)  Coumadin 6 Mg Tabs (Warfarin sodium) .... Take as directed by coumadin clinic. 7)  Lipitor 20 Mg Tabs (Atorvastatin calcium) .Marland Kitchen.. 1 by mouth at bedtime 8)  One Touch Delica Lancets Misc (Lancets) .... Checks blood sugar 1x day, dx 250.00 9)  Prodigy Blood Glucose Test Strp (Glucose blood) .... Check bs 1x daily, dx 250.00 10)  Ventolin Hfa 108 (90 Base) Mcg/act Aers (Albuterol sulfate) .... 2 puffs every 4 hours as needed for shortness of breath or wheezing 11)  Coreg 12.5 Mg Tabs (Carvedilol) .... Take 1 by mouth daily 12)  Antivert 25 Mg Tabs (Meclizine hcl) .Marland Kitchen.. 1 by mouth daily at bedtime. 13)  Prodigy Preferred Monitor W/device Kit (Blood glucose monitoring suppl) .... As directed. 14)  Prodigy Lancing Device Misc (Lancet devices) .... Two times a day 15)  Prodigy Blood Glucose Test Strp (Glucose blood) .... Two times a day 16)  Vitamin D3 2000 Unit Caps (Cholecalciferol) .... Qd 17)  Hydrocodone-acetaminophen 2.5-500 Mg Tabs (Hydrocodone-acetaminophen) .... One or 2 by mouth every 6 hours as needed for pain 18)  Zostavax 29562 Unt/0.55ml  Solr (Zoster vaccine live) .Marland Kitchen.. 1 injection subcutaneously.  Other Orders: TLB-Lipid Panel (80061-LIPID) TLB-ALT (SGPT) (84460-ALT) TLB-AST (SGOT) (84450-SGOT)  Patient Instructions: 1)  Please schedule a follow-up  appointment in 4 months .  Prescriptions: ZOSTAVAX 53664 UNT/0.65ML SOLR (ZOSTER VACCINE LIVE) 1 injection Subcutaneously.  #1 x 0   Entered by:   [name] CMA   Authorized by:   Nolon Rod. Markanthony Gedney MD   Signed by:   [name] CMA on 11/10/2010   Method used:   Print then Give to Patient   RxID:   [id number]    Orders Added: 1)  EKG w/ Interpretation [93000] 2)  Venipuncture [36415] 3)  TLB-A1C / Hgb A1C (Glycohemoglobin) [83036-A1C] 4)  TLB-Lipid Panel [80061-LIPID] 5)  TLB-TSH (Thyroid Stimulating Hormone) [84443-TSH] 6)  TLB-ALT (SGPT) [84460-ALT] 7)  TLB-AST (SGOT) [84450-SGOT] 8)  Specimen Handling [99000] 9)  Radiology Referral [Radiology] 10)  Medicare -1st Annual Wellness Visit [G0438] 11)  Cardiology Referral [Cardiology] 12)  Est. Patient Level IV [43329]     Risk Factors:  Alcohol use:  no Exercise:  no

## 2010-11-18 NOTE — Letter (Signed)
Summary: [facility]   Imported By: Marylou Mccoy 11/12/2010 14:29:12  _____________________________________________________________________  External Attachment:    Type:   Image     Comment:   External Document

## 2010-11-21 LAB — PROTIME-INR
INR: 1.29 (ref 0.00–1.49)
INR: 1.66 — ABNORMAL HIGH (ref 0.00–1.49)
INR: 2.24 — ABNORMAL HIGH (ref 0.00–1.49)
Prothrombin Time: 16.3 seconds — ABNORMAL HIGH (ref 11.6–15.2)
Prothrombin Time: 19.8 seconds — ABNORMAL HIGH (ref 11.6–15.2)
Prothrombin Time: 24.9 seconds — ABNORMAL HIGH (ref 11.6–15.2)

## 2010-11-21 LAB — GLUCOSE, CAPILLARY
Glucose-Capillary: 104 mg/dL — ABNORMAL HIGH (ref 70–99)
Glucose-Capillary: 106 mg/dL — ABNORMAL HIGH (ref 70–99)
Glucose-Capillary: 107 mg/dL — ABNORMAL HIGH (ref 70–99)
Glucose-Capillary: 109 mg/dL — ABNORMAL HIGH (ref 70–99)
Glucose-Capillary: 124 mg/dL — ABNORMAL HIGH (ref 70–99)
Glucose-Capillary: 129 mg/dL — ABNORMAL HIGH (ref 70–99)
Glucose-Capillary: 131 mg/dL — ABNORMAL HIGH (ref 70–99)
Glucose-Capillary: 131 mg/dL — ABNORMAL HIGH (ref 70–99)
Glucose-Capillary: 162 mg/dL — ABNORMAL HIGH (ref 70–99)

## 2010-11-21 LAB — DIFFERENTIAL
Basophils Absolute: 0 10*3/uL (ref 0.0–0.1)
Basophils Relative: 0 % (ref 0–1)
Eosinophils Absolute: 0.2 10*3/uL (ref 0.0–0.7)
Eosinophils Relative: 2 % (ref 0–5)
Lymphocytes Relative: 18 % (ref 12–46)
Lymphs Abs: 1.6 10*3/uL (ref 0.7–4.0)
Monocytes Absolute: 0.6 10*3/uL (ref 0.1–1.0)
Monocytes Relative: 6 % (ref 3–12)
Neutro Abs: 6.7 10*3/uL (ref 1.7–7.7)
Neutrophils Relative %: 74 % (ref 43–77)

## 2010-11-21 LAB — URINE MICROSCOPIC-ADD ON

## 2010-11-21 LAB — LIPID PANEL
Cholesterol: 160 mg/dL (ref 0–200)
HDL: 57 mg/dL (ref >39)
LDL Cholesterol: 89 mg/dL (ref 0–99)
Total CHOL/HDL Ratio: 2.8 RATIO
Triglycerides: 72 mg/dL (ref <150)
VLDL: 14 mg/dL (ref 0–40)

## 2010-11-21 LAB — CBC
HCT: 40.6 % (ref 36.0–46.0)
HCT: 42.9 % (ref 36.0–46.0)
Hemoglobin: 13 g/dL (ref 12.0–15.0)
Hemoglobin: 13.9 g/dL (ref 12.0–15.0)
MCH: 28.3 pg (ref 26.0–34.0)
MCH: 28.7 pg (ref 26.0–34.0)
MCHC: 32 g/dL (ref 30.0–36.0)
MCHC: 32.4 g/dL (ref 30.0–36.0)
MCV: 88.5 fL (ref 78.0–100.0)
MCV: 88.6 fL (ref 78.0–100.0)
Platelets: 217 10*3/uL (ref 150–400)
Platelets: 229 10*3/uL (ref 150–400)
RBC: 4.59 MIL/uL (ref 3.87–5.11)
RBC: 4.84 MIL/uL (ref 3.87–5.11)
RDW: 15.5 % (ref 11.5–15.5)
RDW: 15.5 % (ref 11.5–15.5)
WBC: 8.9 10*3/uL (ref 4.0–10.5)
WBC: 9 10*3/uL (ref 4.0–10.5)

## 2010-11-21 LAB — COMPREHENSIVE METABOLIC PANEL
ALT: 20 U/L (ref 0–35)
AST: 24 U/L (ref 0–37)
Albumin: 3.7 g/dL (ref 3.5–5.2)
Alkaline Phosphatase: 95 U/L (ref 39–117)
BUN: 18 mg/dL (ref 6–23)
CO2: 28 mEq/L (ref 19–32)
Calcium: 9.5 mg/dL (ref 8.4–10.5)
Chloride: 103 mEq/L (ref 96–112)
Creatinine, Ser: 1.29 mg/dL — ABNORMAL HIGH (ref 0.4–1.2)
GFR calc Af Amer: 49 mL/min — ABNORMAL LOW (ref >60)
GFR calc non Af Amer: 41 mL/min — ABNORMAL LOW (ref >60)
Glucose, Bld: 120 mg/dL — ABNORMAL HIGH (ref 70–99)
Potassium: 3.7 mEq/L (ref 3.5–5.1)
Sodium: 142 mEq/L (ref 135–145)
Total Bilirubin: 0.7 mg/dL (ref 0.3–1.2)
Total Protein: 7.4 g/dL (ref 6.0–8.3)

## 2010-11-21 LAB — URINALYSIS, ROUTINE W REFLEX MICROSCOPIC
Bilirubin Urine: NEGATIVE
Glucose, UA: NEGATIVE mg/dL
Ketones, ur: NEGATIVE mg/dL
Leukocytes, UA: NEGATIVE
Nitrite: NEGATIVE
Protein, ur: 100 mg/dL — AB
Specific Gravity, Urine: 1.01 (ref 1.005–1.030)
Urobilinogen, UA: 0.2 mg/dL (ref 0.0–1.0)
pH: 6.5 (ref 5.0–8.0)

## 2010-11-21 LAB — TSH: TSH: 2.778 u[IU]/mL (ref 0.350–4.500)

## 2010-11-21 LAB — APTT: aPTT: 29 seconds (ref 24–37)

## 2010-11-27 ENCOUNTER — Other Ambulatory Visit: Payer: Self-pay | Admitting: Internal Medicine

## 2010-11-27 NOTE — Telephone Encounter (Signed)
Ok 100, 2 RF

## 2010-11-28 ENCOUNTER — Ambulatory Visit: Payer: Medicare Other

## 2010-12-05 ENCOUNTER — Ambulatory Visit
Admission: RE | Admit: 2010-12-05 | Discharge: 2010-12-05 | Disposition: A | Discharge status: Home / Self Care | Payer: Medicare Other | Source: Ambulatory Visit | Attending: Internal Medicine | Admitting: Internal Medicine

## 2010-12-05 DIAGNOSIS — Z1231 Encounter for screening mammogram for malignant neoplasm of breast: Secondary | ICD-10-CM

## 2010-12-10 ENCOUNTER — Ambulatory Visit: Payer: Medicare Other

## 2010-12-19 ENCOUNTER — Encounter: Payer: Self-pay | Admitting: Internal Medicine

## 2010-12-22 ENCOUNTER — Encounter: Payer: Self-pay | Admitting: Internal Medicine

## 2010-12-22 ENCOUNTER — Ambulatory Visit (INDEPENDENT_AMBULATORY_CARE_PROVIDER_SITE_OTHER): Payer: Medicare Other | Admitting: Internal Medicine

## 2010-12-22 VITALS — BP 140/74 | HR 86 | Ht 65.0 in | Wt 195.0 lb

## 2010-12-22 DIAGNOSIS — R0789 Other chest pain: Secondary | ICD-10-CM

## 2010-12-22 DIAGNOSIS — R9431 Abnormal electrocardiogram [ECG] [EKG]: Secondary | ICD-10-CM

## 2010-12-22 DIAGNOSIS — I10 Essential (primary) hypertension: Secondary | ICD-10-CM

## 2010-12-22 DIAGNOSIS — R0602 Shortness of breath: Secondary | ICD-10-CM

## 2010-12-22 DIAGNOSIS — E785 Hyperlipidemia, unspecified: Secondary | ICD-10-CM

## 2010-12-22 NOTE — Progress Notes (Signed)
HPI  Patient is a 75 year old who was referred for cardiac evaluation by Dr. Drue Novel The patient has no known CAD.  She reports intermitt waking up with chest heaviness.  She says it occurs once in a while. She also notes SOB occasionally while watching TV.  She allso notes occasional wheezing. She is difficult historian, but I do not get a history that the patients symptoms are related to activity.  Allergies  Allergen Reactions  . Penicillins     REACTION: anaphylaxis  . Sulfonamide Derivatives     REACTION: unk    Current Outpatient Prescriptions  Medication Sig Dispense Refill  . albuterol (VENTOLIN HFA) 108 (90 BASE) MCG/ACT inhaler Inhale 2 puffs into the lungs every 4 (four) hours as needed.        Marland Kitchen atorvastatin (LIPITOR) 40 MG tablet Take 40 mg by mouth daily.        . beclomethasone (QVAR) 40 MCG/ACT inhaler Inhale 2 puffs into the lungs 2 (two) times daily.        Marland Kitchen buPROPion (WELLBUTRIN XL) 300 MG 24 hr tablet Take 300 mg by mouth daily.        . Cholecalciferol (VITAMIN D3) 2000 UNITS TABS Take 1 tablet by mouth daily.        Marland Kitchen HYDROcodone-acetaminophen (VICODIN) 2.5-500 MG per tablet TAKE 1 TO 2 TABLETS BY MOUTH EVERY 6 HOURS AS NEEDED FOR PAIN  100 tablet  2  . levothyroxine (LEVOTHROID) 137 MCG tablet Take 137 mcg by mouth daily.        Marland Kitchen lisinopril (PRINIVIL,ZESTRIL) 10 MG tablet Take 10 mg by mouth daily.        . meclizine (ANTIVERT) 25 MG tablet Take 25 mg by mouth at bedtime.        . risedronate (ACTONEL) 150 MG tablet Take 150 mg by mouth every 30 (thirty) days. with water on empty stomach, nothing by mouth or lie down for next 30 minutes.       . warfarin (COUMADIN) 6 MG tablet Take by mouth as directed.        . zoster vaccine live, PF, (ZOSTAVAX) [address] UNT/0.65ML injection Inject 0.65 mLs into the skin once.        . carvedilol (COREG) 12.5 MG tablet Take 12.5 mg by mouth daily.          Past Medical History  Diagnosis Date  . Diabetes mellitus   . Asthma   .  COPD (chronic obstructive pulmonary disease)   . Depression   . DVT (deep vein thrombosis) in pregnancy     hx x multiple on coumadin  . Pulmonary embolism     x 2  . GERD (gastroesophageal reflux disease)   . Hypertension   . Osteopenia   . Hypothyroidism   . Hyperlipidemia   . Osteoarthritis     h/o spinal stenosis by MRI 2009  . Back pain   . S/P lumbar fusion     Past Surgical History  Procedure Date  . Appendectomy   . Cholecystectomy   . Tubal ligation   . Tonsillectomy     Family History  Problem Relation Age of Onset  . Colon cancer Mother   . Diabetes Mother     History   Social History  . Marital Status: Divorced    Spouse Name: N/A    Number of Children: 3  . Years of Education: N/A   Occupational History  .     Social History Main  Topics  . Smoking status: Former Games developer  . Smokeless tobacco: Not on file  . Alcohol Use: 0.0 oz/week     socially  . Drug Use: Not on file  . Sexually Active: Not on file   Other Topics Concern  . Not on file   Social History Narrative  . No narrative on file    Review of Systems:  All systems reviewed.  They are negative to the above problem except as previously stated.  Vital Signs: BP 140/74  Pulse 86  Ht 5\' 5"  (1.651 m)  Wt 195 lb (88.451 kg)  BMI 32.45 kg/m2  Physical Exam  HEENT:  Normocephalic, atraumatic. EOMI, PERRLA.  Neck: JVP is normal. No thyromegaly. No bruits.  Lungs: Mildly decreased airflow.  Occasional wheeze Heart: Regular rate and rhythm. Normal S1, S2. No S3.   No significant murmurs. PMI not displaced.  Abdomen:  Supple, nontender. Normal bowel sounds. No masses. No hepatomegaly.  Extremities:   Good distal pulses throughout. Tr lower extremity edema.  Musculoskeletal :moving all extremities.  Neuro:   alert and oriented x3.  CN II-XII grossly intact.  EKG:  NSR.  86.  Freq PACs.  Anteroseptal MI.   Assessment and Plan:

## 2010-12-22 NOTE — Patient Instructions (Signed)
Your physician has requested that you have an echocardiogram. Echocardiography is a painless test that uses sound waves to create images of your heart. It provides your doctor with information about the size and shape of your heart and how well your heart's chambers and valves are working. This procedure takes approximately one hour. There are no restrictions for this procedure. Your physician has requested that you have a dobutamine myoview. For furth information please visit [email or web address] Please follow instruction sheet, as given. Your physician recommends that you schedule a follow-up appointment in: as needed.

## 2010-12-23 DIAGNOSIS — R079 Chest pain, unspecified: Secondary | ICD-10-CM | POA: Insufficient documentation

## 2010-12-23 NOTE — Assessment & Plan Note (Signed)
Continue meds. 

## 2010-12-23 NOTE — Assessment & Plan Note (Signed)
I am a little confused.  Patient reports that she has been on Lipitor for a while.  Not sure if dose has been changed.  Recent LDL was 163.  This will need to be followed.  If on next check it is still high, I would switch to Crestor 20.

## 2010-12-25 ENCOUNTER — Other Ambulatory Visit: Payer: Self-pay | Admitting: Internal Medicine

## 2010-12-29 ENCOUNTER — Other Ambulatory Visit: Payer: Self-pay | Admitting: *Deleted

## 2010-12-29 ENCOUNTER — Ambulatory Visit (HOSPITAL_COMMUNITY): Payer: Medicare Other | Attending: Internal Medicine | Admitting: Radiology

## 2010-12-29 DIAGNOSIS — J449 Chronic obstructive pulmonary disease, unspecified: Secondary | ICD-10-CM | POA: Insufficient documentation

## 2010-12-29 DIAGNOSIS — R9431 Abnormal electrocardiogram [ECG] [EKG]: Secondary | ICD-10-CM

## 2010-12-29 DIAGNOSIS — J4489 Other specified chronic obstructive pulmonary disease: Secondary | ICD-10-CM | POA: Insufficient documentation

## 2010-12-29 DIAGNOSIS — R0789 Other chest pain: Secondary | ICD-10-CM

## 2010-12-29 DIAGNOSIS — R0602 Shortness of breath: Secondary | ICD-10-CM

## 2010-12-29 DIAGNOSIS — R0989 Other specified symptoms and signs involving the circulatory and respiratory systems: Secondary | ICD-10-CM | POA: Insufficient documentation

## 2010-12-29 DIAGNOSIS — I2510 Atherosclerotic heart disease of native coronary artery without angina pectoris: Secondary | ICD-10-CM | POA: Insufficient documentation

## 2010-12-29 DIAGNOSIS — I059 Rheumatic mitral valve disease, unspecified: Secondary | ICD-10-CM | POA: Insufficient documentation

## 2010-12-29 DIAGNOSIS — I491 Atrial premature depolarization: Secondary | ICD-10-CM

## 2010-12-29 DIAGNOSIS — R072 Precordial pain: Secondary | ICD-10-CM | POA: Insufficient documentation

## 2010-12-29 DIAGNOSIS — I079 Rheumatic tricuspid valve disease, unspecified: Secondary | ICD-10-CM | POA: Insufficient documentation

## 2010-12-29 DIAGNOSIS — R0609 Other forms of dyspnea: Secondary | ICD-10-CM | POA: Insufficient documentation

## 2010-12-29 MED ORDER — AMINOPHYLLINE 25 MG/ML IV SOLN
75.0000 mg | INTRAVENOUS | Status: AC
  Administered 2010-12-29: 75 mg via INTRAVENOUS

## 2010-12-29 MED ORDER — TECHNETIUM TC 99M TETROFOSMIN IV KIT
11.0000 | PACK | Freq: Once | INTRAVENOUS | Status: AC | PRN
  Administered 2010-12-29: 11 via INTRAVENOUS

## 2010-12-29 MED ORDER — BECLOMETHASONE DIPROPIONATE 40 MCG/ACT IN AERS: 2.0000 | INHALATION_SPRAY | Freq: Two times a day (BID) | RESPIRATORY_TRACT | Status: DC

## 2010-12-29 MED ORDER — REGADENOSON 0.4 MG/5ML IV SOLN
0.4000 mg | Freq: Once | INTRAVENOUS | Status: AC
  Administered 2010-12-29: 0.4 mg via INTRAVENOUS

## 2010-12-29 MED ORDER — TECHNETIUM TC 99M TETROFOSMIN IV KIT
33.0000 | PACK | Freq: Once | INTRAVENOUS | Status: AC | PRN
  Administered 2010-12-29: 33 via INTRAVENOUS

## 2010-12-29 NOTE — Progress Notes (Signed)
[facility] SITE 3 NUCLEAR MED [address] [place] [address] [phone]  Cardiology Nuclear Med Study  Dawn Morrow is a 75 y.o. female [id number] Jan 16, 1936   Nuclear Med Background Indication for Stress Test:  Evaluation for Ischemia History:  Asthma, COPD, '08 Echo EF 55-60% and '08 Myocardial Perfusion Study NL EF 75% Cardiac Risk Factors: History of Smoking, Hypertension, Lipids and NIDDM  Symptoms:  Chest Pressure, Dizziness, DOE, Palpitations and SOB   Nuclear Pre-Procedure Caffeine/Decaff Intake:  None NPO After: 9:00pm   Lungs: clear IV 0.9% NS with Angio Cath:  22g  IV Site: L Antecubital  IV Started by:  Irean Hong, RN  Chest Size (in):  44 Cup Size: DD  Height: 5' 5.5" (1.664 m)  Weight:  192 lb (87.091 kg)  BMI:  Body mass index is 31.46 kg/(m^2). Tech Comments:  The patient thinks she took carvedilol this am,  lung fields clear, O2 Sat 98% RA; changed to [employer].  This patient started having joint pain during her wait for her stress pictures. The pain was all over and she stated it was much more intense than normal. Aminophylline 75.0 mg IV given. Within 5 minutes the patient was pain free. S.Williams EMTP    Nuclear Med Study 1 or 2 day study: 1 day  Stress Test Type:  Eugenie Birks  Reading MD: Cassell Clement, MD  Order Authorizing Provider:  P.Ross  Resting Radionuclide: Technetium 87m Tetrofosmin  Resting Radionuclide Dose: 11.0 mCi   Stress Radionuclide:  Technetium 32m Tetrofosmin  Stress Radionuclide Dose: 33.0 mCi           Stress Protocol Rest HR: 67 Stress HR: 90  Rest BP: 126/79 Stress BP: 154/70  Exercise Time (min): n/a METS: n/a   Predicted Max HR: 148 bpm % Max HR: 60.81 bpm Rate Pressure Product: 21308   Dose of Adenosine (mg):  n/a Dose of Lexiscan: 0.4 mg  Dose of Atropine (mg): n/a Dose of Dobutamine: n/a mcg/kg/min (at max HR)  Stress Test Technologist: Milana Na, EMT-P  Nuclear Technologist:  Harlow Asa, CNMT     Rest Procedure:  Myocardial perfusion imaging was performed at rest 45 minutes following the intravenous administration of Technetium 62m Tetrofosmin. Rest ECG: SR with PACS  Stress Procedure:  The patient received IV Lexiscan 0.4 mg over 15-seconds.  Technetium 55m Tetrofosmin injected at 30-seconds.  There were no significant changes and rare pacs with Lexiscan.  Quantitative spect images were obtained after a 45 minute delay. Stress ECG: No significant change from baseline ECG  QPS Raw Data Images:  Normal; no motion artifact; normal heart/lung ratio. Stress Images:  Normal homogeneous uptake in all areas of the myocardium. Rest Images:  Normal homogeneous uptake in all areas of the myocardium. Subtraction (SDS):  No evidence of ischemia. Transient Ischemic Dilatation (Normal <1.22):  1.07 Lung/Heart Ratio (Normal <0.45):  0.31  Quantitative Gated Spect Images QGS EDV:  69 ml QGS ESV:  23 ml QGS cine images:  NL LV Function; NL Wall Motion QGS EF: 66%  Impression Exercise Capacity:  Lexiscan with low level exercise. BP Response:  Normal blood pressure response. Clinical Symptoms:  No chest pain. ECG Impression:  No significant ST segment change suggestive of ischemia. Comparison with Prior Nuclear Study: Since previous study EF has decreased from 75% on 04/25/07   Overall Impression:  Normal stress nuclear study.      Cassell Clement

## 2010-12-30 NOTE — Progress Notes (Addendum)
ROUTED TO DR. Tenny Craw.Dawn Morrow   Myoview is normal.  No evidence of blood flow problems. To explain occasional chest pains. Patient had mild wheezing on exam when I saw her.  This may account for some of problems.  Should discuss wih Dr. Drue Novel.

## 2011-01-07 ENCOUNTER — Telehealth: Payer: Self-pay | Admitting: *Deleted

## 2011-01-07 NOTE — Telephone Encounter (Signed)
LMOM for call back for results of echo and nuc study.

## 2011-01-07 NOTE — Progress Notes (Signed)
LMOM for call back. 

## 2011-01-09 ENCOUNTER — Telehealth: Payer: Self-pay | Admitting: Internal Medicine

## 2011-01-09 NOTE — Telephone Encounter (Signed)
Spoke with patient's sister. She was not home. LM for her to call next week.

## 2011-01-09 NOTE — Telephone Encounter (Signed)
See note from 5/4.

## 2011-01-09 NOTE — Telephone Encounter (Signed)
PT RTNING CALL TO JACKIE FROM LAST WEEK

## 2011-01-13 ENCOUNTER — Telehealth: Payer: Self-pay | Admitting: *Deleted

## 2011-01-13 NOTE — Telephone Encounter (Signed)
LMOM for call back for eco and nuc study results.

## 2011-01-15 ENCOUNTER — Telehealth: Payer: Self-pay | Admitting: Internal Medicine

## 2011-01-15 NOTE — Telephone Encounter (Signed)
LMOM for call back at [phone].

## 2011-01-15 NOTE — Progress Notes (Signed)
Called patient with results and recommendations.  

## 2011-01-15 NOTE — Telephone Encounter (Signed)
Pt rtn your call to get test results °

## 2011-01-15 NOTE — Telephone Encounter (Signed)
Called patient with results of echo and nuc study.

## 2011-01-15 NOTE — Telephone Encounter (Signed)
Pt returning your call

## 2011-01-15 NOTE — Telephone Encounter (Signed)
Called patient with results of echo and myoview.

## 2011-01-19 ENCOUNTER — Other Ambulatory Visit: Payer: Self-pay | Admitting: Internal Medicine

## 2011-01-20 NOTE — Assessment & Plan Note (Signed)
A 75 year old female with history of lumbar pain.  She has had  laminectomies at L4-5 and L5-S1 levels.  She was having L2-L3  radiculitis.  Her Coumadin was stopped and PT/INR checked.   PROCEDURE:  She had right L2-L3 radiculitis and underwent L2-L3  translaminar lumbar epidural steroid injection under fluoroscopic  guidance on July 12, 2008.  She had no postprocedure complications.  She has been doing much better with her pain.  She has only taken about  10 tramadol in the last month.   Her current pain level is 3/10.  The pain increased to 5/10 with  activities.  Sleep is good.  She can walk 30 minutes at a time.  She  only occasionally uses tramadol and she gets good relief from this.   REVIEW OF SYSTEMS:  Positive for constipation as well as easy bleeding.  She is back on her Coumadin, but has had some problems with Coumadin  regulation.   PAST MEDICAL HISTORY:  Thyroid problems and high blood pressure.   SOCIAL HISTORY:  Divorced.   PHYSICAL EXAMINATION:  VITAL SIGNS:  Blood pressure 172/72, recheck was  157/70; pulse 71; respirations 18; O2 sat 96% on room air.  GENERAL:  In no acute distress.  Orientation x3.  Affect is alert.  Gait  is normal.  MUSCULOSKELETAL:  Her lower extremity strength is 5/5 in the hip  flexion, knee extension, and ankle dorsiflexion.  She is able to toe-  walk as well as do a standard gait without evidence of toe drag or knee  instability.  BACK:  No tenderness to palpation of the lumbar paraspinals, except  right at the peri-incisional area, and this is only mild.  EXTREMITIES:  Without edema.   IMPRESSION:  1. Lumbar spine range of motion is 50%, forward flexion and extension.  2. Lumbar post laminectomy syndrome.  3. L2-3 radiculitis, improved.   PLAN:  1. We will see her back in about 6 weeks and if the effects of the      injection are wearing off, we will schedule her for another      injection.  Given that she needs to come off her  Coumadin each time      before injection, we will try to stretch out this interval as long      as we can.  2. Hypertension.  I asked her to followup with her primary care      physician on this.      Erick Colace, M.D.  Electronically Signed     AEK/MedQ  D:  08/13/2008 14:17:09  T:  08/14/2008 09:07:08  Job #:  [id number]   cc:   Willow Ora, MD  [phone] [address] [place], [place] [address]

## 2011-01-20 NOTE — Assessment & Plan Note (Signed)
[facility]                            CARDIOLOGY OFFICE NOTE   Dawn, Morrow AE                        MRN:          [id number]  DATE:04/13/2007                            DOB:          December 27, 1935    REFERRING PHYSICIAN:  Willow Ora, MD   REASON FOR CONSULTATION:  Chest pain.   HISTORY OF PRESENT ILLNESS:  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  She has a history of a reassuring adenosine Myoview  done back in December 2005 with an ejection fraction greater than 70%.  She has been experiencing intermittent discomfort described as the  feeling of a baby's fist pressing her chest since last month.  This  seems to be fairly brief and sporadic.  She is somewhat vague about how  long it occurs, but seems to indicate anywhere from seconds to minutes.  She does not take anything specific to make this resolve and does not  seem to report any clear relation to exertion, although she does have  baseline dyspnea on exertion and sometimes has to stop to catch her  breath.  She has also had some dizziness and headaches intermittently  and complaints of heartburn.  Her electrocardiogram shows sinus rhythm  with poor R-wave progression, which is old.  She had a chest x-ray back  in July which showed no active cardiopulmonary disease pattern.   ALLERGIES:  PENICILLIN, CODEINE and SULFA DRUGS.   PRESENT MEDICATIONS:  1. Lipitor 20 mg p.o. daily.  2. Coumadin as directed.  3. Lisinopril 10 mg p.o. daily.  4. Levothyroxine 100 mcg p.o. daily.  5. Wellbutrin XL 300 mg p.o. daily.  6. Protonix 40 mg p.o. daily.  7. Flonase.  8. Sanctura Xr 60 mg p.o. daily.  9. Nitrofurantoin 100 mg p.o. daily.   PAST MEDICAL HISTORY:  As outlined above.  Additional problems include  hypertension, hyperlipidemia, previous  back surgery with fusion,  appendectomy, cholecystectomy, uterine fibroids and tonsillectomy.   REVIEW OF SYSTEMS:  As described in the history of present illness.  She  reports problems with intermittent asthma exacerbations and seasonal  allergies.  She has arthritic discomfort, has a history of depression,  also occasional constipation.   FAMILY HISTORY:  Reviewed, significant for cancer in the patient's  mother.  No obvious premature cardiovascular disease is described.   PHYSICAL EXAMINATION:  Weight 179 pounds.  Blood pressure 134/80, heart  rate 78.  The patient is in no acute distress, without active chest pain.  HEENT:  Conjunctivae are normal.  Oropharynx clear.  NECK:  Supple.  No elevated jugular venous pressure, no loud bruits or  thyromegaly.  LUNGS:  Generally clear, somewhat diminished breath sounds.  No wheezing  apparent.  CARDIAC:  Exam reveals a regular rate and rhythm.  No S3 gallop.  No  pericardial rub.  ABDOMEN:  Soft and nontender.  Normoactive bowel sounds.  EXTREMITIES:  No pitting edema.  Distal pulses are  1+.  SKIN:  Warm and dry.  No significant venous stasis.  MUSCULOSKELETAL:  No kyphosis noted.  NEUROPSYCHIATRIC:  The patient is alert and oriented x3.   IMPRESSION AND RECOMMENDATION:  1. Intermittent chest pain over baseline of dyspnea on exertion in a      75 year old woman with no previously documented history of      obstructive coronary artery disease.  Her electrocardiogram shows      poor anterior R-wave progression which is old.  She does have      additional risk factors including hypertension and hyperlipidemia.      There is also a history of thromboembolic disease, status post      previous inferior vena cava filter and on chronic Coumadin therapy.      Recent chest x-ray was normal.  We will plan a followup adenosine      Myoview and echocardiogram and I will have her return to the office      to discuss the results.  2. Further plans  to follow.     Jonelle Sidle, MD  Electronically Signed    SGM/MedQ  DD: 04/13/2007  DT: 04/14/2007  Job #: [id number]   cc:   Willow Ora, MD

## 2011-01-20 NOTE — Consult Note (Signed)
CONSULT REQUESTED BY:  Willow Ora, MD   REASON FOR CONSULTATION:  Consult requested for evaluation of back pain  with lower extremity pain.   CHIEF COMPLAINT:  A 75 year old female with a 1-year plus history of  back pain, which was insidious in onset.  She has had no fall  immediately preceding her pain onset, but has had some falls more often  recently.  She has pain, mainly on the right side in the hip, radiating  into the leg.  The patient has had low back surgery years ago, this was  prior to moving to [place] in 2005.  Symptoms have progressed over  time, however.  She rates her average pain is 5/10.  Her pain is worse  in the evening hours and worse with walking, but improves with rest.  She does not climb steps.  She does not drive.  She has occasional toe  numbness, right greater than left side.   REVIEW OF SYSTEMS:  Positive for dizziness.   PAST MEDICAL HISTORY:  Significant for CVA, COPD, hypothyroidism,  hypertension, depression, DVT, and early dementia.   PAST SURGICAL HISTORY:  She has had back surgery, but I do not have the  operative note.  Based on the imaging studies, she appears to have a  diskectomy at L3-L4, laminectomies at L4-L5 and L5-S1.  She has had  appendectomy, tubal ligation, and has had IVC filter placement.   SOCIAL HISTORY:  Divorced.  Lives with her daughter.   FAMILY HISTORY:  Unobtainable.  She was adopted.   IMAGING STUDIES:  MRI on May 21, 2008 demonstrates small central  disk at L1-L2, moderate broad-based herniation with moderate stenosis at  the lateral recesses at L2-L3, moderate stenosis at L3-L4 due to disk  osteophyte complex as well as some ligamentum flavum hypertrophy.  She  does have some calcification of posterior longitudinal ligament as well,  no significant foraminal stenosis at L3-L4, but some mild foraminal  narrowing at L5-S1.  She has some mild-to-moderate facet hypertrophy in  the mid-to-lower lumbar levels.  She  has had hip x-rays on the right  side, which was normal, performed on September 19, 2007.   On her hip x-rays, fairly impressive ridging osteophytes at L4-L5 and L5-  S1 noted.   PHYSICAL EXAMINATION:  VITAL SIGNS:  Blood pressure 145/83, pulse 81,  respirations 18, and O2 sat 96% on room air.  GENERAL:  Well-developed, well-nourished female in no acute distress.  Orientation x3.  Affect is alert.  She does have a tremor in her upper  extremities as well as neck.  She has a history of familial tremor, and  this is typical high-frequency tremor.   Her motor strength is 5/5 bilateral deltoids, biceps, triceps, and grip  as well as hip flexion, knee extension, and ankle dorsiflexion.  Sensation normal in the upper and lower extremities.  Coordination is  normal in the upper and lower extremities with the exception of the  interference from fine motor tremor in the upper extremity.  Deep tendon  reflexes are 2+ bilateral biceps, triceps, brachioradialis, patellar,  and Achilles.   Hip, knee, and ankle range of motion normal.  Shoulder, elbow, and wrist  range of motion are normal.  Gait is somewhat forward flexed, widened-  based support, and unsteady.  Her back has mild tenderness to palpation  of lumbar paraspinals.   IMPRESSION:  1. Lumbar radiculitis, likely at L2-L3 versus L3-L4 level.  Given MRI      findings  and clinical, would select L3 as initial level.  We will      do translaminar injections, paramedian approach right side.  2. Referral to balance program to minimize fall risk and do      osteoporosis education.   Thank you for this interesting consultation.  We will keep you apprised  of her progress.      Erick Colace, M.D.  Electronically Signed     AEK/MedQ  D:06/29/2008 16:09:39  T:06/30/2008 04:13:19  Job #:  [id number]

## 2011-01-20 NOTE — Procedures (Signed)
Dawn Morrow, BOHALL                  ACCOUNT NO.:  [id number]   MEDICAL RECORD NO.:  [id number]         PATIENT TYPE:  AECP   LOCATION:                                 FACILITY:   PHYSICIAN:  Erick Colace, M.D.DATE OF BIRTH:  08/27/1936   DATE OF PROCEDURE:  DATE OF DISCHARGE:                               OPERATIVE REPORT   PROCEDURE:  L2-3 lumbar radiculitis.   Pain is only partially responsive to medications as well as physical  therapy interferes with household duties and bending.   Informed consent was obtained after describing risks and benefits of the  procedure with the patient.  These include bleeding, bruising,  infection.  She elects to proceed and has given written consent.  She  has been off Coumadin several days.  Her INR is 0.9.   The patient was placed prone on fluoroscopy table.  Betadine prep,  sterile drape.  A 25-gauge 1-1/2 needle was used to anesthetize the skin  and subcu tissue, 1% lidocaine x2 mL.  Then, an 18-gauge [place] needle  was inserted under fluoroscopic guidance into L2-3 interlaminar space,  right paramedian approach.  Loss of resistance technique employed,  positive loss of resistance, negative heme, negative CSF, followed by  injection of Omnipaque 180 x1 mL demonstrated good epidural spread, no  intravascular uptake, followed by injection of 2 mL of 40 mg/mL Depo-  Medrol and 2 mL of 1% lidocaine.  The patient tolerated the procedure  well.  Post injection instructions given.  I will see her back in about  1 month to see how she is doing, decide on further treatment at that  time.      Erick Colace, M.D.  Electronically Signed     AEK/MEDQ  D:  07/12/2008 14:57:10  T:  07/13/2008 02:17:46  Job:  [id number]   cc:   Willow Ora, MD  [phone] [place], [place] [address]

## 2011-01-20 NOTE — Assessment & Plan Note (Signed)
[facility]                            CARDIOLOGY OFFICE NOTE   Dawn, Morrow AE                        MRN:          [id number]  DATE:05/13/2007                            DOB:          November 29, 1935    PRIMARY CARE PHYSICIAN:  Dawn Morrow, M.D.   REASON FOR VISIT:  Follow up cardiac testing.   HISTORY OF PRESENT ILLNESS:  I saw Dawn Morrow back in August.  Her history  is detailed in my previous note.  She was referred at that time with a  history of intermittent chest pain on a baseline of chronic dyspnea on  exertion.  Her symptoms were somewhat atypical, although she did have an  abnormal resting electrocardiogram and risk factors, including  hypertension and hyperlipidemia, as well as prior thromboembolic  disease, treated with chronic Coumadin, and an inferior vena cava  filter.  I referred her for an echocardiogram, demonstrating a normal  left ventricular ejection fraction of 55-60% with no regional wall  motion abnormalities.  She had a mildly calcified mitral valve, but no  major valvular abnormalities.  Ischemic evaluation was reassuring with  Myoview indicating no scar or ischemia with an ejection fraction of 75%.   I reviewed these results with the patient today.  Symptomatically, she  reports being stable with no recurrent chest pain since her last visit.  I have recommended basic risk factor modification and continued followup  of her thromboembolic disease with Dr. Drue Morrow.   ALLERGIES:  PENICILLIN, CODEINE, SULFA DRUGS.   PRESENT MEDICATIONS:  1. Lipitor 20 mg p.o. daily.  2. Coumadin as directed by Dr. Drue Morrow.  3. Lisinopril 10 mg p.o. daily.  4. Levothyroxine 100 micrograms p.o. daily.  5. Wellbutrin XL 300 mg p.o. daily.  6. Flonase.  7. Sanctura XR 60 mg p.o. daily.  8. Nitro-Dur.   REVIEW OF SYSTEMS:  As described in History of Present Illness.   EXAMINATION:  Blood pressure 128/80, heart rate 80, weight 181 pounds.  Patient is  comfortable, in no acute distress.  Examination of neck reveals no elevated jugular venous pressure.  LUNGS:  Generally clear.  No rales or egophony.  Somewhat diminished  breath sounds.  CARDIAC EXAM:  Reveals a regular rate and rhythm, no S3 gallop.  EXTREMITIES:  Exhibit no pitting edema.   IMPRESSION/RECOMMENDATIONS:  1. Reassuring cardiac evaluation, including a Myoview, indicating no      ischemia or scar, and an echocardiogram, showing normal left      ventricular ejection fraction with no marked valvular abnormalities      beyond mild calcification of the mitral valve, without restricted      leaflet motion and with no significant mitral regurgitation.  Would      anticipate basic risk factor modification strategies.  She will      plan to continue regular followup with Dr. Drue Morrow for management of      her other comorbid illness.  2. Cardiology followup can be p.r.n.     Dawn Sidle, MD  Electronically Signed    SGM/MedQ  DD: 05/13/2007  DT: 05/13/2007  Job #: [id number]   cc:   Dawn Ora, MD

## 2011-01-22 ENCOUNTER — Ambulatory Visit (INDEPENDENT_AMBULATORY_CARE_PROVIDER_SITE_OTHER): Payer: Medicare Other | Admitting: *Deleted

## 2011-01-22 DIAGNOSIS — Z7901 Long term (current) use of anticoagulants: Secondary | ICD-10-CM

## 2011-01-22 DIAGNOSIS — I82409 Acute embolism and thrombosis of unspecified deep veins of unspecified lower extremity: Secondary | ICD-10-CM

## 2011-01-22 DIAGNOSIS — I2699 Other pulmonary embolism without acute cor pulmonale: Secondary | ICD-10-CM

## 2011-01-22 LAB — POCT INR: INR: 1.1

## 2011-01-22 MED ORDER — WARFARIN SODIUM 6 MG PO TABS: ORAL_TABLET | ORAL | Status: DC

## 2011-01-23 NOTE — H&P (Signed)
NAMEBELLADONNA, LUBINSKI NO.:  [id number]   MEDICAL RECORD NO.:  [id number]          PATIENT TYPE:  INP   LOCATION:  1826                         FACILITY:  [facility]   PHYSICIAN:  Wanda Plump, MD [facility]    DATE OF BIRTH:  [birth year]   DATE OF ADMISSION:  07/11/2004  DATE OF DISCHARGE:                                HISTORY & PHYSICAL   CHIEF COMPLAINT:  Here to establish.   HISTORY OF THE PRESENT ILLNESS:  Ms. Sulton is a 75 year old white female who  came here for the first time to the office today with her sister and niece.  The patient reports that 3 weeks ago she checked out from the nursing home  in [place] where she was living because she was not happy living in that place.  After that, she saw a doctor in [place] who stopped some of her medications  including Coumadin, and put her on aspirin.  Her sister went to [place],  brought her sister back to [place] after a 2-day car trip.  The patient  arrived to [place] last night, is here to get established.  At the  office, she was noticed to be nauseous, slightly confused, and hypoxic with  an O2 saturation of 90% in room air, and she also complained of chest pain.  At this time, I made the decision to admit this patient to the hospital.   PAST MEDICAL HISTORY:  1.  Surgical history includes lower back surgery, appendectomy,      cholecystectomy, tonsillectomy, and bilateral tubal ligation.  2.  Childbirth x3.  3.  She reports a history of either asthma or COPD.  4.  History of multiple DVTs in the past.  She also had a PE 2 years ago      which apparently was extensive and put her in the hospital for several      days.  5.  Depression.  6.  Hypothyroidism.  7.  Questionable diabetes - the patient is unsure.  8.  Status post eye surgery bilaterally.  9.  History of benign tremor.  10. Does not know if she ever had heart disease or strokes in the past.   FAMILY HISTORY:  Unknown, because the patient is adopted.   SOCIAL HISTORY:  She is a former smoker.  She admits to smoking four or five  cigarettes a day many years ago.  No alcohol.   MEDICATIONS:  Currently, she is on the following:  1.  Advair 100/50 one puff twice a day.  2.  Albuterol nebulization as needed.  3.  Wellbutrin XL 300 one p.o. daily.  4.  Levothroid 0.1 mg a day.  5.  Lisinopril 10 mg one p.o. daily.  6.  She was on chronic Coumadin but this was stopped 2 weeks ago.  She used      to have to take 7.5 mg a day.  7.  She was prescribed recently Macrobid for a UTI.  8.  Again, the patient used to be on a long list of medications but they  have been stopped in the last couple of weeks.   REVIEW OF SYSTEMS:  She denies any fever.  She admits to chest pain.  She  states that she had that before, but it is a lot worse in the last 2 weeks.  The pain is located at the mid anterior chest without radiation, and there  is a questionable association with shortness of breath and diaphoresis.  The  pain is worse whenever she is anxious.  Also, her baseline shortness of  breath is definitely worse in the last couple of weeks.  Her cough and  sputum production seems to be at baseline.  She also reports diarrhea in the  last few days which she has described as frequent loose stools without any  blood.  She was diagnosed with a UTI and had hematuria, but that is  resolved.   ALLERGIES:  1.  PENICILLIN.  2.  SULFA.   PHYSICAL EXAMINATION:  GENERAL:  The patient is alert, oriented.  She did  get a little confused during the interview, mostly when I tried to ask  details about her past medical history symptoms.  VITAL SIGNS:  She weighs 224.5 pounds, blood pressure 158/90, pulse 80,  respirations 18, O2 saturation on room air was 90%.  NECK:  Normal carotid pulses.  LUNGS:  She has a few rhonchi and decreased breath sounds bilaterally.  There was respiratory distress with minimal exertion, particularly when I  asked her to take some  steps in the room.  CARDIOVASCULAR:  Regular rate and rhythm.  I did not appreciate a murmur  today.  ABDOMEN:  She has diffuse mild tenderness and increased bowel sounds.  There  is no mass or rebound.  EXTREMITIES:  She has significant varicose veins bilaterally but no pitting  edema.  She is somewhat tender to palpation on the right calf.  NEUROLOGIC:  Her speech is fluent.  Her gait is somewhat unsteady, I believe  mostly because she gets short of breath.  Her motor is symmetric.  She does  have face and neck tremor.  Extraocular movements are intact.  Pupils are  equal and reactive.  Her reflexes are decreased symmetrically throughout.   LABORATORY AND X-RAYS:  At the office we did an EKG which showed no acute  changes.   ASSESSMENT AND PLAN:  1.  Ms. Upshur is a lady with multiple medical problems including a history of      deep venous thrombosis and a pulmonary embolus in the past who has been      off Coumadin for 2 weeks and presents today with hypoxia and increased      chest pain and shortness of breath from baseline.  I am concerned about      the possibility of new PE.  She will be admitted to the hospital and put      on IV heparin and restarted on Coumadin.  2.  The patient also has chest pain.  The differential diagnoses again      include pulmonary embolus and coronary artery disease.  She will be      admitted to telemetry and will do serial enzymes and EKGs.  3.  The patient has diarrhea and some nausea.  I do not know if this is      related with antibiotics or not.  Will simply observe at this time.  Of      course, will check basic laboratory tests including a CBC.  4.  The patient's family will bring her previous list of medications.  5.  As far as her disposition after she is stable, the patient has expressed      her desire to live with her sister here in [place], and at some is     possible she would like to go to an assisted living facility.       JEP/MEDQ  D:  07/11/2004  T:  07/11/2004  Job:  [id number]

## 2011-01-23 NOTE — Discharge Summary (Signed)
NAMEKURA, BETHARDS NO.:  [id number]   MEDICAL RECORD NO.:  [id number]          PATIENT TYPE:  INP   LOCATION:  2008                         FACILITY:  [facility]   PHYSICIAN:  Nelva Nay, MD      DATE OF BIRTH:  [birth year]   DATE OF ADMISSION:  07/11/2004  DATE OF DISCHARGE:  07/14/2004                                 DISCHARGE SUMMARY   DISCHARGE DIAGNOSES:  1.  Chest pain.  2.  Dyspnea.  3.  Nausea and diarrhea.  4.  Early dementia.  5.  Gait instability.   BRIEF ADMISSION HISTORY:  Ms. Bobo is a 75 year old white female who has  just moved to [place] from [place].  She arrived the night prior to  admission.  She presented to the office with complaints of nausea,  confusion, and hypoxemia.   PAST MEDICAL HISTORY:  1.  Question of asthma versus COPD, continues to smoke four to five      cigarettes a day.  2.  History of multiple DVTs in the past, placement of [place] IVC      filter.  3.  History of PE two years ago.  4.  Previously chronically anticoagulated, recently stopped.  5.  Depression.  6.  Hypothyroidism.  7.  Adult onset diabetes mellitus, currently diet-controlled.  8.  Status post bilateral eye surgeries.  9.  Benign tremor.   HOSPITAL COURSE:  Problem 1. Cardiovascular.  The patient was admitted to  rule out MI due to chest pain.  Serial cardiac enzymes were negative for  ischemia.  The patient did rule out for MI.  Cardiac risk factors include  hypertension, hypercholesterolemia, diabetes, and tobacco.  The patient  denies prior history of heart disease.  The patient would probably benefit  from risk stratification with Cardiolite.  This can be done as an  outpatient.   Problem 2. Pulmonary.  The patient complained of dyspnea, she was not  hypoxic.  O2 saturation 97% on room air on admission.  She was admitted to  rule out possible PE.  Chest x-ray was negative.  BMP was negative.  CT of  the chest was negative for PE, congestive  heart failure or pneumonia.  The  patient has not been hypoxic during this admission.  Saturations have been  97% on room air, there is no evidence of any active pulmonary disease at  this time.   Problem 3. History of deep vein thrombosis and pleural effusion, currently  with an IVC filter in place.  Anticoagulation discontinued, it is not clear  why and, as noted, the patient is new to the area.  However, the patient  seems to be quite unsteady on her feet, she states she is wobbly when she is  up, so perhaps her Coumadin was discontinued secondary to her increased for  fall.   Problem 4. Mental status changes.  Head CT revealed small bilateral basal  ganglia lacunar densities but no acute intracranial findings were  identified.  The patient's TSH was normal.  We suspect a component of  underlying early dementia.  The patient may benefit from a medication like  Aricept but we will defer to her primary physician.   Problem 5. Hypothyroidism.  The patient's TSH was normal.   Problem 6. Infectious disease.  The patient recently was treated for a  urinary tract infection and is on Macrobid.  Urinalysis was negative on  admission and she is afebrile and her white count is normal as well.  Her  last dose of Macrobid is 07/15/04.   Problem 7. Hyperlipidemia.  Fasting lipid profile was obtained during this  admission.  The patient's total cholesterol was 222, triglycerides 240, HDL  40, and LDL was 134.  Currently, she is not on any lipid-lowering agents.   Problem 8. Hypertension.  Overall controlled.   LABORATORY DATA AT DISCHARGE:  Urine culture was negative.  BUN 14,  creatinine 1.1.  Hemoglobin 13.7.  BNP was 34.9.  TSH was 1.805.   DISCHARGE MEDICATIONS:  1.  Advair 100/50, one puff b.i.d.  2.  Wellbutrin XL 300 mg daily.  3.  Levothroid 0.1 mg daily.  4.  Lisinopril 10 mg daily.  5.  Coumadin 5 mg, two tablets on 07/14/04 and 07/15/04, then she is to follow      up for a pro  time on 07/16/04.  6.  Macrodantin 50 mg q.i.d., last dose 07/15/04.  7.  Protonix 40 mg daily.   The patient may also need to be on a statin medication.       LC/MEDQ  D:  07/14/2004  T:  07/14/2004  Job:  [id number]   cc:   Wanda Plump, MD [facility]  [phone] [address] [place], [place] [address]

## 2011-01-26 ENCOUNTER — Encounter: Payer: Self-pay | Admitting: Cardiology

## 2011-01-29 ENCOUNTER — Ambulatory Visit (INDEPENDENT_AMBULATORY_CARE_PROVIDER_SITE_OTHER): Payer: Medicare Other | Admitting: *Deleted

## 2011-01-29 DIAGNOSIS — Z7901 Long term (current) use of anticoagulants: Secondary | ICD-10-CM

## 2011-01-29 DIAGNOSIS — I82409 Acute embolism and thrombosis of unspecified deep veins of unspecified lower extremity: Secondary | ICD-10-CM

## 2011-01-29 DIAGNOSIS — I2699 Other pulmonary embolism without acute cor pulmonale: Secondary | ICD-10-CM

## 2011-01-29 LAB — POCT INR: INR: 2.2

## 2011-02-10 ENCOUNTER — Ambulatory Visit (INDEPENDENT_AMBULATORY_CARE_PROVIDER_SITE_OTHER): Payer: Medicare Other | Admitting: *Deleted

## 2011-02-10 DIAGNOSIS — Z7901 Long term (current) use of anticoagulants: Secondary | ICD-10-CM

## 2011-02-10 DIAGNOSIS — I2699 Other pulmonary embolism without acute cor pulmonale: Secondary | ICD-10-CM

## 2011-02-10 DIAGNOSIS — I82409 Acute embolism and thrombosis of unspecified deep veins of unspecified lower extremity: Secondary | ICD-10-CM

## 2011-02-10 LAB — POCT INR: INR: 1.3

## 2011-02-17 ENCOUNTER — Encounter: Payer: Medicare Other | Admitting: *Deleted

## 2011-02-18 ENCOUNTER — Other Ambulatory Visit: Payer: Self-pay | Admitting: Internal Medicine

## 2011-02-24 ENCOUNTER — Ambulatory Visit (INDEPENDENT_AMBULATORY_CARE_PROVIDER_SITE_OTHER): Payer: Medicare Other | Admitting: *Deleted

## 2011-02-24 ENCOUNTER — Encounter: Payer: Self-pay | Admitting: Gastroenterology

## 2011-02-24 ENCOUNTER — Other Ambulatory Visit: Payer: Self-pay | Admitting: Internal Medicine

## 2011-02-24 DIAGNOSIS — Z7901 Long term (current) use of anticoagulants: Secondary | ICD-10-CM

## 2011-02-24 DIAGNOSIS — I82409 Acute embolism and thrombosis of unspecified deep veins of unspecified lower extremity: Secondary | ICD-10-CM

## 2011-02-24 DIAGNOSIS — I2699 Other pulmonary embolism without acute cor pulmonale: Secondary | ICD-10-CM

## 2011-02-24 LAB — POCT INR: INR: 5.2

## 2011-02-24 MED ORDER — WARFARIN SODIUM 6 MG PO TABS: ORAL_TABLET | ORAL | Status: DC

## 2011-02-24 NOTE — Telephone Encounter (Signed)
Ok 100, no RF Tell pt no further RF w/o OV

## 2011-02-25 ENCOUNTER — Telehealth: Payer: Self-pay | Admitting: *Deleted

## 2011-02-27 NOTE — Telephone Encounter (Signed)
Patient has 4 month followup appt for 03/13/2011 at 1:00 with Dr Drue Novel

## 2011-03-02 ENCOUNTER — Ambulatory Visit: Payer: Medicare Other | Admitting: Gastroenterology

## 2011-03-05 ENCOUNTER — Encounter: Payer: Medicare Other | Admitting: *Deleted

## 2011-03-10 ENCOUNTER — Encounter: Payer: Self-pay | Admitting: Gastroenterology

## 2011-03-12 ENCOUNTER — Encounter: Payer: Self-pay | Admitting: Internal Medicine

## 2011-03-13 ENCOUNTER — Ambulatory Visit: Payer: Medicare Other | Admitting: Internal Medicine

## 2011-03-13 DIAGNOSIS — Z0289 Encounter for other administrative examinations: Secondary | ICD-10-CM

## 2011-03-16 ENCOUNTER — Ambulatory Visit (INDEPENDENT_AMBULATORY_CARE_PROVIDER_SITE_OTHER): Payer: Medicare Other | Admitting: *Deleted

## 2011-03-16 ENCOUNTER — Encounter: Payer: Medicare Other | Admitting: *Deleted

## 2011-03-16 DIAGNOSIS — I82409 Acute embolism and thrombosis of unspecified deep veins of unspecified lower extremity: Secondary | ICD-10-CM

## 2011-03-16 DIAGNOSIS — Z7901 Long term (current) use of anticoagulants: Secondary | ICD-10-CM

## 2011-03-16 DIAGNOSIS — I2699 Other pulmonary embolism without acute cor pulmonale: Secondary | ICD-10-CM

## 2011-03-16 LAB — POCT INR: INR: 2.3

## 2011-03-23 ENCOUNTER — Other Ambulatory Visit: Payer: Self-pay | Admitting: Internal Medicine

## 2011-03-24 ENCOUNTER — Other Ambulatory Visit: Payer: Self-pay | Admitting: Internal Medicine

## 2011-03-25 NOTE — Telephone Encounter (Signed)
See 02-24-2011 refill call: "no further RF w/o OV"

## 2011-03-25 NOTE — Telephone Encounter (Signed)
Left message for [name] to call back.  

## 2011-03-30 ENCOUNTER — Ambulatory Visit (INDEPENDENT_AMBULATORY_CARE_PROVIDER_SITE_OTHER): Payer: Medicare Other | Admitting: Internal Medicine

## 2011-03-30 ENCOUNTER — Encounter: Payer: Self-pay | Admitting: Internal Medicine

## 2011-03-30 DIAGNOSIS — E039 Hypothyroidism, unspecified: Secondary | ICD-10-CM

## 2011-03-30 DIAGNOSIS — E785 Hyperlipidemia, unspecified: Secondary | ICD-10-CM

## 2011-03-30 DIAGNOSIS — I10 Essential (primary) hypertension: Secondary | ICD-10-CM

## 2011-03-30 DIAGNOSIS — M1990 Unspecified osteoarthritis, unspecified site: Secondary | ICD-10-CM

## 2011-03-30 DIAGNOSIS — E119 Type 2 diabetes mellitus without complications: Secondary | ICD-10-CM

## 2011-03-30 DIAGNOSIS — M810 Age-related osteoporosis without current pathological fracture: Secondary | ICD-10-CM

## 2011-03-30 DIAGNOSIS — L719 Rosacea, unspecified: Secondary | ICD-10-CM

## 2011-03-30 MED ORDER — MECLIZINE HCL 25 MG PO TABS: 25.0000 mg | ORAL_TABLET | Freq: Every day | ORAL | Status: DC

## 2011-03-30 MED ORDER — BECLOMETHASONE DIPROPIONATE 40 MCG/ACT IN AERS: 2.0000 | INHALATION_SPRAY | Freq: Two times a day (BID) | RESPIRATORY_TRACT | Status: DC

## 2011-03-30 MED ORDER — CARVEDILOL 12.5 MG PO TABS: 12.5000 mg | ORAL_TABLET | Freq: Every day | ORAL | Status: DC

## 2011-03-30 MED ORDER — LISINOPRIL 10 MG PO TABS: 10.0000 mg | ORAL_TABLET | Freq: Every day | ORAL | Status: DC

## 2011-03-30 MED ORDER — RISEDRONATE SODIUM 150 MG PO TABS: 150.0000 mg | ORAL_TABLET | ORAL | Status: DC

## 2011-03-30 MED ORDER — HYDROCODONE-ACETAMINOPHEN 2.5-500 MG PO TABS: ORAL_TABLET | ORAL | Status: DC

## 2011-03-30 MED ORDER — LEVOTHYROXINE SODIUM 137 MCG PO TABS: 137.0000 ug | ORAL_TABLET | Freq: Every day | ORAL | Status: DC

## 2011-03-30 MED ORDER — ATORVASTATIN CALCIUM 40 MG PO TABS: 40.0000 mg | ORAL_TABLET | Freq: Every day | ORAL | Status: DC

## 2011-03-30 MED ORDER — BUPROPION HCL ER (XL) 300 MG PO TB24: 300.0000 mg | ORAL_TABLET | ORAL | Status: DC

## 2011-03-30 NOTE — Progress Notes (Signed)
  Subjective:    Patient ID: Dawn Morrow, female    DOB: 1935/12/23, 75 y.o.   MRN: [id number]  HPI Routine office visit, here with her boyfriend. Complains of a rash in the face for 6 weeks, no itching.+ history of rosacea. Osteopenia, we switch her to Actonel, and good medication compliance, no apparent side effects High cholesterol, we increased Lipitor from 20 to 40 mg. Reports good compliance. Hypothyroidism, good medication compliance. Chest pain, was evaluated by cardiology in April, echocardiogram negative, stress test negative as well. Back pain, long  history of problems with her back. Takes hydrocodone around 4 times a day which relatively good control.   Past Medical History  Diagnosis Date  . Diabetes mellitus   . Asthma   . COPD (chronic obstructive pulmonary disease)   . Depression   . DVT (deep vein thrombosis) in pregnancy     hx x multiple on coumadin  . Pulmonary embolism     x 2  . GERD (gastroesophageal reflux disease)   . Hypertension   . Osteopenia   . Hypothyroidism   . Hyperlipidemia   . Osteoarthritis     h/o spinal stenosis by MRI 2009  . Back pain   . S/P lumbar fusion   . Hiatal hernia   . Diverticulosis   . Hemorrhoids   . Tubular adenoma of colon 07/2005   Past Surgical History  Procedure Date  . Appendectomy   . Cholecystectomy   . Tubal ligation   . Tonsillectomy       Review of Systems No chest pain, shortness of breath very seldom, at baseline. No cough, sputum production or hemoptysis. Takes all her inhalers regularly. No nausea, vomiting, diarrhea. Since her last visit she had mammograms , colonoscopy is pending ; has plan to get that done.     Objective:   Physical Exam  Constitutional: She appears well-developed and well-nourished. No distress.  HENT:  Head: Normocephalic and atraumatic.  Cardiovascular: Normal rate and regular rhythm.   No murmur heard. Pulmonary/Chest: Effort normal and breath sounds normal. No  respiratory distress. She has no wheezes. She has no rales.  Musculoskeletal: She exhibits no edema.  Skin: She is not diaphoretic.       Few maculopapular red lesions, 2-3 mm in size at the nose, forehead (bilaterally). Some at the forehead seem like very small blisters           Assessment & Plan:

## 2011-03-30 NOTE — Assessment & Plan Note (Signed)
last bone density test was 10-14-09 Tscore L hip decreased from-2.0 to -2.6 she was  on boniva ----> now  to actonel ,   good medication compliance  reasses w dexa~ 10-2011 Check a vitamin D

## 2011-03-30 NOTE — Assessment & Plan Note (Signed)
Based on the last cholesterol profile, we increased Lipitor from 20-40. Good compliance. Reportedly she is fasting. Labs

## 2011-03-30 NOTE — Patient Instructions (Signed)
Check the  blood pressure 2 or 3 times a week, be sure it is less than 140/85. If it is consistently higher, let me know  

## 2011-03-30 NOTE — Assessment & Plan Note (Signed)
BP today 140/88. See instructions

## 2011-03-30 NOTE — Assessment & Plan Note (Signed)
On diet control , A1C stable. Lab

## 2011-03-30 NOTE — Assessment & Plan Note (Addendum)
Facial rash, somehow atypical for rosacea. In the past (10-04-2009) she failed to improve with topical antibiotics, was referred to dermatology, they diagnosed her with excoriation and prescribed doxycycline (11-2009)  Plan: Dermatology re-referral

## 2011-03-30 NOTE — Assessment & Plan Note (Signed)
No change , last TSH normal.

## 2011-03-30 NOTE — Assessment & Plan Note (Addendum)
History of back pain, hip pain and spinal stenosis. Continue with hydrocodone. She has been eval by [facility], they order MRI 09-2010, then Rx A  local injection 10-2010

## 2011-03-31 ENCOUNTER — Telehealth: Payer: Self-pay | Admitting: *Deleted

## 2011-03-31 LAB — CBC WITH DIFFERENTIAL/PLATELET
Basophils Absolute: 0.1 10*3/uL (ref 0.0–0.1)
Basophils Relative: 0.6 % (ref 0.0–3.0)
Eosinophils Absolute: 0.4 10*3/uL (ref 0.0–0.7)
Eosinophils Relative: 4.1 % (ref 0.0–5.0)
HCT: 38.8 % (ref 36.0–46.0)
Hemoglobin: 12.5 g/dL (ref 12.0–15.0)
Lymphocytes Relative: 29.3 % (ref 12.0–46.0)
Lymphs Abs: 2.6 10*3/uL (ref 0.7–4.0)
MCHC: 32.2 g/dL (ref 30.0–36.0)
MCV: 88.1 fl (ref 78.0–100.0)
Monocytes Absolute: 0.5 10*3/uL (ref 0.1–1.0)
Monocytes Relative: 5.7 % (ref 3.0–12.0)
Neutro Abs: 5.4 10*3/uL (ref 1.4–7.7)
Neutrophils Relative %: 60.3 % (ref 43.0–77.0)
Platelets: 235 10*3/uL (ref 150.0–400.0)
RBC: 4.4 Mil/uL (ref 3.87–5.11)
RDW: 16.5 % — ABNORMAL HIGH (ref 11.5–14.6)
WBC: 9 10*3/uL (ref 4.5–10.5)

## 2011-03-31 LAB — HEMOGLOBIN A1C: Hgb A1c MFr Bld: 6.5 % (ref 4.6–6.5)

## 2011-03-31 LAB — LIPID PANEL
Cholesterol: 167 mg/dL (ref 0–200)
HDL: 54.9 mg/dL (ref >39.00)
LDL Cholesterol: 93 mg/dL (ref 0–99)
Total CHOL/HDL Ratio: 3
Triglycerides: 97 mg/dL (ref 0.0–149.0)
VLDL: 19.4 mg/dL (ref 0.0–40.0)

## 2011-03-31 LAB — BASIC METABOLIC PANEL
BUN: 30 mg/dL — ABNORMAL HIGH (ref 6–23)
CO2: 28 mEq/L (ref 19–32)
Calcium: 9.2 mg/dL (ref 8.4–10.5)
Chloride: 111 mEq/L (ref 96–112)
Creatinine, Ser: 1.3 mg/dL — ABNORMAL HIGH (ref 0.4–1.2)
GFR: 41.2 mL/min — ABNORMAL LOW (ref >60.00)
Glucose, Bld: 96 mg/dL (ref 70–99)
Potassium: 5 mEq/L (ref 3.5–5.1)
Sodium: 145 mEq/L (ref 135–145)

## 2011-03-31 LAB — AST: AST: 20 U/L (ref 0–37)

## 2011-03-31 LAB — ALT: ALT: 16 U/L (ref 0–35)

## 2011-03-31 NOTE — Telephone Encounter (Signed)
Should pt be taking Carvedilol 1 by mouth twice a day or once a day?

## 2011-03-31 NOTE — Telephone Encounter (Signed)
According to our records is once a day. The formulation should be carvedilol ER 12.5 mg instead of  "plain carvedilol"; please correct

## 2011-03-31 NOTE — Telephone Encounter (Signed)
There is no Carvedilol ER 12.5mg  in our system.

## 2011-03-31 NOTE — Telephone Encounter (Signed)
Message left for patient to return my call.  

## 2011-03-31 NOTE — Telephone Encounter (Signed)
True; the extended release formulation is 10 mg, it  will be very expensive. Check with patient---> if she has been taking Coreg 12.5 once a day and is working for her, then no change.

## 2011-04-01 ENCOUNTER — Telehealth: Payer: Self-pay | Admitting: Internal Medicine

## 2011-04-01 NOTE — Telephone Encounter (Signed)
Noted  

## 2011-04-01 NOTE — Telephone Encounter (Signed)
Message left for patient to return my call.  

## 2011-04-01 NOTE — Telephone Encounter (Signed)
In reference to Dermatology Referral, when I called to inform patient of her appointment, she asked that I cancel the appointment.  She states she has decided the rash is only due to her nerves.  I informed her to please call back if she decides to reschedule.

## 2011-04-02 ENCOUNTER — Telehealth: Payer: Self-pay | Admitting: *Deleted

## 2011-04-02 NOTE — Telephone Encounter (Signed)
Has been taking once a day-- per [name] though Ms.Sato is taking care of her on medications now.

## 2011-04-02 NOTE — Telephone Encounter (Signed)
Message copied by Leanne Lovely on Thu Apr 02, 2011 10:07 AM ------      Message from: Willow Ora E      Created: Wed Apr 01, 2011  5:13 PM       Advise patient:      Her cholesterol is great with a higher dose of Lipitor. No change      Diabetes continued to be well controlled.      Kidney function is slightly decreased but stable. I recommend to drink plenty of water during this summer.      Good results!

## 2011-04-02 NOTE — Telephone Encounter (Signed)
Message left for patient to return my call.  

## 2011-04-03 NOTE — Telephone Encounter (Signed)
Dawn Morrow is aware, will inform pt.

## 2011-04-13 ENCOUNTER — Ambulatory Visit (INDEPENDENT_AMBULATORY_CARE_PROVIDER_SITE_OTHER): Payer: Medicare Other | Admitting: *Deleted

## 2011-04-13 DIAGNOSIS — I2699 Other pulmonary embolism without acute cor pulmonale: Secondary | ICD-10-CM

## 2011-04-13 DIAGNOSIS — Z7901 Long term (current) use of anticoagulants: Secondary | ICD-10-CM

## 2011-04-13 DIAGNOSIS — I82409 Acute embolism and thrombosis of unspecified deep veins of unspecified lower extremity: Secondary | ICD-10-CM

## 2011-04-13 LAB — POCT INR: INR: 4.1

## 2011-04-21 ENCOUNTER — Other Ambulatory Visit: Payer: Self-pay | Admitting: Internal Medicine

## 2011-04-22 ENCOUNTER — Encounter: Payer: Self-pay | Admitting: Gastroenterology

## 2011-04-22 ENCOUNTER — Ambulatory Visit (INDEPENDENT_AMBULATORY_CARE_PROVIDER_SITE_OTHER): Payer: Medicare Other | Admitting: Gastroenterology

## 2011-04-22 VITALS — BP 120/72 | HR 76 | Ht 65.0 in | Wt 197.0 lb

## 2011-04-22 DIAGNOSIS — Z86010 Personal history of colonic polyps: Secondary | ICD-10-CM

## 2011-04-22 DIAGNOSIS — Z7901 Long term (current) use of anticoagulants: Secondary | ICD-10-CM

## 2011-04-22 NOTE — Progress Notes (Signed)
History of Present Illness: This is a 75 year old female returns for followup of numerous colon polyps she relates intermittent problems with diarrhea when she finds that food. Otherwise she has no other gastrointestinal complaints. She specifically denies any change in bowel habits, abdominal pain, melena, hematochezia, change in stool caliber. She has stable diabetes mellitus, hypertension and COPD. She is maintained on Coumadin anticoagulation for a history of recurrent DVTs and pulmonary emboli. She has a history of a [place] filter placement.   Review of Systems: Pertinent positive and negative review of systems were noted in the above HPI section. All other review of systems were otherwise negative.  Current Medications, Allergies, Past Medical History, Past Surgical History, Family History and Social History were reviewed in [employer] record.  Physical Exam: General: Well developed , well nourished, no acute distress Head: Normocephalic and atraumatic Eyes:  sclerae anicteric, EOMI Ears: Normal auditory acuity Mouth: No deformity or lesions Neck: Supple, no masses or thyromegaly Lungs: Clear throughout to auscultation Heart: Regular rate and rhythm; no murmurs, rubs or bruits Abdomen: Soft, non tender and non distended. No masses, hepatosplenomegaly or hernias noted. Normal Bowel sounds Rectal: Deferred to colonoscopy Musculoskeletal: Symmetrical with no gross deformities  Skin: No lesions on visible extremities Pulses:  Normal pulses noted Extremities: No clubbing, cyanosis, edema or deformities noted Neurological: Alert oriented x 4, grossly nonfocal Cervical Nodes:  No significant cervical adenopathy Inguinal Nodes: No significant inguinal adenopathy Psychological:  Alert and cooperative. Normal mood and affect  Assessment and Recommendations:  1. Personal history of adenomatous colon polyps. She is overdue for a surveillance colonoscopy. She has  multiple comorbidities and takes hydrocodone and Wellbutrin so will plan to use propofol sedation. Plan to hold Coumadin for 3 days prior to procedure and restart on the day of the procedure if this plan is cleared by her primary physician. The risks, benefits, and alternatives to colonoscopy with possible biopsy and possible polypectomy were discussed with the patient and they consent to proceed. The risks, benefits, and alternatives to colonoscopy with possible biopsy and possible polypectomy were discussed with the patient and they consent to proceed.   2. Chronic Coumadin anticoagulation for history of DVTs and pulmonary emboli. See problem #1.  3. Diabetes mellitus. Standard protocol for bowel prep and procedure.  4. COPD.

## 2011-04-22 NOTE — Patient Instructions (Signed)
You have been scheduled for a Colonoscopy. Separate instructions given. Suprep kit sample given. You will be contaced by our office prior to your procedure for directions on holding your Coumadin/Warfarin.  If you do not hear from our office 1 week prior to your scheduled procedure, please call [phone] to discuss. cc: Willow Ora, MD

## 2011-04-27 ENCOUNTER — Ambulatory Visit (INDEPENDENT_AMBULATORY_CARE_PROVIDER_SITE_OTHER): Payer: Medicare Other | Admitting: *Deleted

## 2011-04-27 ENCOUNTER — Telehealth: Payer: Self-pay | Admitting: Pharmacist

## 2011-04-27 DIAGNOSIS — Z7901 Long term (current) use of anticoagulants: Secondary | ICD-10-CM

## 2011-04-27 DIAGNOSIS — I82409 Acute embolism and thrombosis of unspecified deep veins of unspecified lower extremity: Secondary | ICD-10-CM

## 2011-04-27 DIAGNOSIS — I2699 Other pulmonary embolism without acute cor pulmonale: Secondary | ICD-10-CM

## 2011-04-27 LAB — POCT INR: INR: 4.3

## 2011-04-27 NOTE — Telephone Encounter (Signed)
Spoke with patient and informed her to stop coumadin 3 days prior to procedure. Pt verbalized understanding.

## 2011-04-27 NOTE — Telephone Encounter (Signed)
Message copied by Velda Shell on Mon Apr 27, 2011 12:00 PM ------      Message from: Willow Ora E      Created: Wed Apr 22, 2011  4:09 PM       Yes and restart ASAP given history of previous events      THX!      ----- Message -----         From: Mariane Masters, PHARMD         Sent: 04/22/2011   1:00 PM           To: Wanda Plump, MD            Pt needs to hold Coumadin 3 days prior to colonoscopy.  Is this okay?             Thanks,       Kennon Rounds

## 2011-05-12 ENCOUNTER — Ambulatory Visit (AMBULATORY_SURGERY_CENTER): Payer: Medicare Other | Admitting: Gastroenterology

## 2011-05-12 ENCOUNTER — Encounter: Payer: Self-pay | Admitting: Gastroenterology

## 2011-05-12 VITALS — BP 145/102 | HR 82 | Temp 98.8°F | Resp 20 | Ht 65.0 in | Wt 200.0 lb

## 2011-05-12 DIAGNOSIS — D1330 Benign neoplasm of unspecified part of small intestine: Secondary | ICD-10-CM

## 2011-05-12 DIAGNOSIS — Z1211 Encounter for screening for malignant neoplasm of colon: Secondary | ICD-10-CM

## 2011-05-12 DIAGNOSIS — Z86010 Personal history of colonic polyps: Secondary | ICD-10-CM

## 2011-05-12 DIAGNOSIS — D126 Benign neoplasm of colon, unspecified: Secondary | ICD-10-CM

## 2011-05-12 MED ORDER — SODIUM CHLORIDE 0.9 % IV SOLN: 500.0000 mL | INTRAVENOUS | Status: DC

## 2011-05-12 NOTE — Patient Instructions (Signed)
Please review discharge instructions (blue and green sheets)  Resume Coumadin today  Hold aspirin, aspirin containing medications, and anti-inflammatory medications for 2 weeks (05-26-11)  Follow high fiber diet with liberal fluid intake

## 2011-05-13 ENCOUNTER — Telehealth: Payer: Self-pay

## 2011-05-13 NOTE — Telephone Encounter (Signed)
Follow up Call- Patient questions:  Do you have a fever, pain , or abdominal swelling? no Pain Score  0 *  Have you tolerated food without any problems? yes  Have you been able to return to your normal activities? yes  Do you have any questions about your discharge instructions: Diet   no Medications  no Follow up visit  no  Do you have questions or concerns about your Care? no  Actions: * If pain score is 4 or above: No action needed, pain <4.  Per the pt I'm fine. maw

## 2011-05-18 ENCOUNTER — Encounter: Payer: Self-pay | Admitting: Gastroenterology

## 2011-05-19 ENCOUNTER — Ambulatory Visit (INDEPENDENT_AMBULATORY_CARE_PROVIDER_SITE_OTHER): Payer: Medicare Other | Admitting: *Deleted

## 2011-05-19 DIAGNOSIS — I2699 Other pulmonary embolism without acute cor pulmonale: Secondary | ICD-10-CM

## 2011-05-19 DIAGNOSIS — Z7901 Long term (current) use of anticoagulants: Secondary | ICD-10-CM

## 2011-05-19 DIAGNOSIS — I82409 Acute embolism and thrombosis of unspecified deep veins of unspecified lower extremity: Secondary | ICD-10-CM

## 2011-05-19 LAB — POCT INR: INR: 3

## 2011-05-20 ENCOUNTER — Other Ambulatory Visit: Payer: Self-pay | Admitting: Internal Medicine

## 2011-05-22 ENCOUNTER — Other Ambulatory Visit: Payer: Self-pay | Admitting: Internal Medicine

## 2011-06-05 ENCOUNTER — Ambulatory Visit (INDEPENDENT_AMBULATORY_CARE_PROVIDER_SITE_OTHER): Payer: Medicare Other | Admitting: *Deleted

## 2011-06-05 DIAGNOSIS — I82409 Acute embolism and thrombosis of unspecified deep veins of unspecified lower extremity: Secondary | ICD-10-CM

## 2011-06-05 DIAGNOSIS — I2699 Other pulmonary embolism without acute cor pulmonale: Secondary | ICD-10-CM

## 2011-06-05 DIAGNOSIS — Z7901 Long term (current) use of anticoagulants: Secondary | ICD-10-CM

## 2011-06-05 LAB — PROTIME-INR
INR: 6.74 (ref <1.50)
Prothrombin Time: 59.5 seconds — ABNORMAL HIGH (ref 11.6–15.2)

## 2011-06-05 LAB — POCT INR: INR: 7.7

## 2011-06-08 NOTE — Patient Instructions (Signed)
Pt refused to make appt at this time.  Explained importance of rechecking.  Pt states she will call later today to make appt.

## 2011-06-12 ENCOUNTER — Ambulatory Visit (INDEPENDENT_AMBULATORY_CARE_PROVIDER_SITE_OTHER): Payer: Medicare Other | Admitting: *Deleted

## 2011-06-12 DIAGNOSIS — Z7901 Long term (current) use of anticoagulants: Secondary | ICD-10-CM

## 2011-06-12 DIAGNOSIS — I82409 Acute embolism and thrombosis of unspecified deep veins of unspecified lower extremity: Secondary | ICD-10-CM

## 2011-06-12 DIAGNOSIS — I2699 Other pulmonary embolism without acute cor pulmonale: Secondary | ICD-10-CM

## 2011-06-12 LAB — POCT INR: INR: 2.4

## 2011-06-26 ENCOUNTER — Encounter: Payer: Medicare Other | Admitting: *Deleted

## 2011-07-15 ENCOUNTER — Other Ambulatory Visit: Payer: Self-pay | Admitting: Internal Medicine

## 2011-08-10 ENCOUNTER — Ambulatory Visit: Payer: Medicare Other | Admitting: Internal Medicine

## 2011-08-10 DIAGNOSIS — Z0289 Encounter for other administrative examinations: Secondary | ICD-10-CM

## 2011-08-18 ENCOUNTER — Other Ambulatory Visit: Payer: Self-pay | Admitting: Internal Medicine

## 2011-08-20 ENCOUNTER — Other Ambulatory Visit: Payer: Self-pay | Admitting: Internal Medicine

## 2011-08-20 NOTE — Telephone Encounter (Signed)
Last filled 07-15-11 #100, last OV 03-30-11

## 2011-08-31 NOTE — Telephone Encounter (Signed)
Ok #100, no RF 

## 2011-09-03 ENCOUNTER — Telehealth: Payer: Self-pay | Admitting: Internal Medicine

## 2011-09-03 NOTE — Telephone Encounter (Signed)
Patient wants refill hydrocodone - patient will pick up Friday [id number]

## 2011-09-03 NOTE — Telephone Encounter (Signed)
Rx requested for hydrocodone--patient was seen 03/30/11 and filled 08/20/11 # 100. Please advise    KP

## 2011-09-04 NOTE — Telephone Encounter (Signed)
msg left to call the office     KP 

## 2011-09-04 NOTE — Telephone Encounter (Signed)
Denied, just got  #100 3 weeks ago

## 2011-09-04 NOTE — Telephone Encounter (Signed)
Patient made aware and she voiced understanding      KP 

## 2011-09-07 ENCOUNTER — Ambulatory Visit (INDEPENDENT_AMBULATORY_CARE_PROVIDER_SITE_OTHER): Payer: Medicare Other | Admitting: *Deleted

## 2011-09-07 DIAGNOSIS — Z7901 Long term (current) use of anticoagulants: Secondary | ICD-10-CM

## 2011-09-07 DIAGNOSIS — I2699 Other pulmonary embolism without acute cor pulmonale: Secondary | ICD-10-CM

## 2011-09-07 DIAGNOSIS — I82409 Acute embolism and thrombosis of unspecified deep veins of unspecified lower extremity: Secondary | ICD-10-CM

## 2011-09-07 LAB — POCT INR: INR: 4.1

## 2011-09-18 ENCOUNTER — Ambulatory Visit (INDEPENDENT_AMBULATORY_CARE_PROVIDER_SITE_OTHER): Payer: Medicare Other | Admitting: *Deleted

## 2011-09-18 DIAGNOSIS — I82409 Acute embolism and thrombosis of unspecified deep veins of unspecified lower extremity: Secondary | ICD-10-CM

## 2011-09-18 DIAGNOSIS — I2699 Other pulmonary embolism without acute cor pulmonale: Secondary | ICD-10-CM

## 2011-09-18 DIAGNOSIS — Z7901 Long term (current) use of anticoagulants: Secondary | ICD-10-CM

## 2011-09-18 LAB — POCT INR: INR: 2.8

## 2011-09-19 ENCOUNTER — Other Ambulatory Visit: Payer: Self-pay | Admitting: Internal Medicine

## 2011-09-28 ENCOUNTER — Encounter: Payer: Self-pay | Admitting: Internal Medicine

## 2011-09-28 ENCOUNTER — Ambulatory Visit (INDEPENDENT_AMBULATORY_CARE_PROVIDER_SITE_OTHER): Payer: Medicare Other | Admitting: Internal Medicine

## 2011-09-28 VITALS — BP 108/68 | HR 87 | Temp 97.8°F | Resp 16 | Wt 199.5 lb

## 2011-09-28 DIAGNOSIS — M1990 Unspecified osteoarthritis, unspecified site: Secondary | ICD-10-CM

## 2011-09-28 DIAGNOSIS — E039 Hypothyroidism, unspecified: Secondary | ICD-10-CM

## 2011-09-28 DIAGNOSIS — Z23 Encounter for immunization: Secondary | ICD-10-CM

## 2011-09-28 DIAGNOSIS — F329 Major depressive disorder, single episode, unspecified: Secondary | ICD-10-CM

## 2011-09-28 DIAGNOSIS — I10 Essential (primary) hypertension: Secondary | ICD-10-CM

## 2011-09-28 DIAGNOSIS — J45909 Unspecified asthma, uncomplicated: Secondary | ICD-10-CM

## 2011-09-28 DIAGNOSIS — E119 Type 2 diabetes mellitus without complications: Secondary | ICD-10-CM

## 2011-09-28 MED ORDER — ALBUTEROL SULFATE HFA 108 (90 BASE) MCG/ACT IN AERS: 2.0000 | INHALATION_SPRAY | RESPIRATORY_TRACT | Status: DC | PRN

## 2011-09-28 MED ORDER — HYDROCODONE-ACETAMINOPHEN 2.5-500 MG PO TABS: ORAL_TABLET | ORAL | Status: DC

## 2011-09-28 NOTE — Assessment & Plan Note (Addendum)
Despite the recent lost of her sister and separation from boyfriend, she is doing ok, cont present meds

## 2011-09-28 NOTE — Assessment & Plan Note (Signed)
RF albuterol

## 2011-09-28 NOTE — Progress Notes (Signed)
  Subjective:    Patient ID: Dawn Morrow, female    DOB: 1936/05/01, 76 y.o.   MRN: [id number]  HPI Routine office visit Since the last time, she lost her sister, she used to help her a lot, currently her niece Marlis Edelson helps her. She also their relationship with her boyfriend ended, despite these 2 situations she is doing okay emotionally.  Past Medical History: AODM Hypertension Asthma, COPD Depression Osteopenia hypothyroidism Hyperlipidemia GERD Hematology --Multiple DVTs before, on coumadin --Pulmonary embolism, hx of  x 2  --increased homocysreine levels Osteoarthritis (h/o spinal stenosis by MRI 2009, ongoing back pain)  Past Surgical History: Appendectomy Cholecystectomy Lumbar fusion,  in the 90s Tubal ligation Tonsillectomy  Family History: colon ca--  biological M breast ca-- no but patient not sure  DM-- M CAD-- no  Social History: Single, lives by herself, lost her sister in 2012. Her niece Marlis Edelson helps her, phone number [phone] 3   kids doesn't drive tobacco--no currently a smoker ETOH-- socially Does Patient Exercise:  no   Review of Systems She does have a lot of back pain and needs a refill on hydrocodone. Will do. Medication compliance with respiratory medicines has not needed albuterol much at all. Medication list is reviewed  and share it with Chery    Objective:   Physical Exam  Constitutional: She appears well-developed and well-nourished. No distress.  Cardiovascular: Normal rate, regular rhythm and normal heart sounds.   Pulmonary/Chest:       Slightly decreased breath sounds otherwise clear  Musculoskeletal: She exhibits no edema.  Skin: She is not diaphoretic.  Psychiatric: She has a normal mood and affect. Her behavior is normal.       Assessment & Plan:  Today , I spent more than  with the patient, >50% of the time counseling, and  reviewing the chart and med list

## 2011-09-28 NOTE — Assessment & Plan Note (Signed)
Due for labs

## 2011-09-28 NOTE — Assessment & Plan Note (Signed)
Diet controlled, labs 

## 2011-09-28 NOTE — Assessment & Plan Note (Signed)
RF meds  

## 2011-09-28 NOTE — Assessment & Plan Note (Signed)
No change, labs  

## 2011-09-29 ENCOUNTER — Encounter: Payer: Self-pay | Admitting: Internal Medicine

## 2011-10-08 ENCOUNTER — Telehealth: Payer: Self-pay | Admitting: Internal Medicine

## 2011-10-08 NOTE — Telephone Encounter (Signed)
The patient was supposed to have a BMP, TSH, A1c when she saw me last, see office visit note. Please arrange for labs

## 2011-10-09 NOTE — Telephone Encounter (Signed)
Spoke with Lyn Hollingshead (pts nephew/caregiver) and he says she has [facility]. He is requesting the labs be done there at that visit. Please advise and let him know if this is ok.

## 2011-10-12 NOTE — Telephone Encounter (Signed)
OK to have labs done at coumadin clinic? Please advise.

## 2011-10-15 NOTE — Telephone Encounter (Signed)
No problem. BMP-- dx  hypertension  hemoglobin A1c --- dx  TSH ---- dx

## 2011-10-16 ENCOUNTER — Encounter: Payer: Medicare Other | Admitting: *Deleted

## 2011-11-19 ENCOUNTER — Encounter: Payer: Self-pay | Admitting: Internal Medicine

## 2011-11-19 NOTE — Telephone Encounter (Signed)
Pt never got labs done , will send a letter

## 2011-11-30 ENCOUNTER — Ambulatory Visit (INDEPENDENT_AMBULATORY_CARE_PROVIDER_SITE_OTHER): Payer: Medicare Other | Admitting: Pharmacist

## 2011-11-30 DIAGNOSIS — I2699 Other pulmonary embolism without acute cor pulmonale: Secondary | ICD-10-CM

## 2011-11-30 DIAGNOSIS — Z7901 Long term (current) use of anticoagulants: Secondary | ICD-10-CM

## 2011-11-30 DIAGNOSIS — I82409 Acute embolism and thrombosis of unspecified deep veins of unspecified lower extremity: Secondary | ICD-10-CM

## 2011-11-30 LAB — POCT INR: INR: 3.1

## 2012-01-21 ENCOUNTER — Ambulatory Visit (INDEPENDENT_AMBULATORY_CARE_PROVIDER_SITE_OTHER): Payer: Medicare Other | Admitting: Internal Medicine

## 2012-01-21 VITALS — BP 126/82 | HR 73 | Temp 97.5°F | Wt 203.0 lb

## 2012-01-21 DIAGNOSIS — R259 Unspecified abnormal involuntary movements: Secondary | ICD-10-CM

## 2012-01-21 DIAGNOSIS — J449 Chronic obstructive pulmonary disease, unspecified: Secondary | ICD-10-CM

## 2012-01-21 DIAGNOSIS — E119 Type 2 diabetes mellitus without complications: Secondary | ICD-10-CM

## 2012-01-21 DIAGNOSIS — E039 Hypothyroidism, unspecified: Secondary | ICD-10-CM

## 2012-01-21 DIAGNOSIS — J4489 Other specified chronic obstructive pulmonary disease: Secondary | ICD-10-CM

## 2012-01-21 DIAGNOSIS — M549 Dorsalgia, unspecified: Secondary | ICD-10-CM

## 2012-01-21 DIAGNOSIS — IMO0001 Reserved for inherently not codable concepts without codable children: Secondary | ICD-10-CM

## 2012-01-21 DIAGNOSIS — R6251 Failure to thrive (child): Secondary | ICD-10-CM

## 2012-01-21 DIAGNOSIS — R627 Adult failure to thrive: Secondary | ICD-10-CM | POA: Insufficient documentation

## 2012-01-21 DIAGNOSIS — I10 Essential (primary) hypertension: Secondary | ICD-10-CM

## 2012-01-21 DIAGNOSIS — R251 Tremor, unspecified: Secondary | ICD-10-CM

## 2012-01-21 DIAGNOSIS — I2699 Other pulmonary embolism without acute cor pulmonale: Secondary | ICD-10-CM

## 2012-01-21 DIAGNOSIS — E785 Hyperlipidemia, unspecified: Secondary | ICD-10-CM

## 2012-01-21 LAB — HEMOGLOBIN A1C: Hgb A1c MFr Bld: 6.4 % (ref 4.6–6.5)

## 2012-01-21 LAB — BASIC METABOLIC PANEL
BUN: 20 mg/dL (ref 6–23)
CO2: 25 mEq/L (ref 19–32)
Calcium: 8.7 mg/dL (ref 8.4–10.5)
Chloride: 105 mEq/L (ref 96–112)
Creatinine, Ser: 1.4 mg/dL — ABNORMAL HIGH (ref 0.4–1.2)
GFR: 38.14 mL/min — ABNORMAL LOW (ref >60.00)
Glucose, Bld: 111 mg/dL — ABNORMAL HIGH (ref 70–99)
Potassium: 4.5 mEq/L (ref 3.5–5.1)
Sodium: 141 mEq/L (ref 135–145)

## 2012-01-21 LAB — LDL CHOLESTEROL, DIRECT: Direct LDL: 152.4 mg/dL

## 2012-01-21 LAB — POCT INR: INR: 1.3

## 2012-01-21 LAB — LIPID PANEL
Cholesterol: 202 mg/dL — ABNORMAL HIGH (ref 0–200)
HDL: 41.5 mg/dL (ref >39.00)
Total CHOL/HDL Ratio: 5
Triglycerides: 120 mg/dL (ref 0.0–149.0)
VLDL: 24 mg/dL (ref 0.0–40.0)

## 2012-01-21 LAB — HEMOGLOBIN: Hemoglobin: 13.7 g/dL (ref 12.0–15.0)

## 2012-01-21 LAB — TSH: TSH: 0.61 u[IU]/mL (ref 0.35–5.50)

## 2012-01-21 LAB — AST: AST: 19 U/L (ref 0–37)

## 2012-01-21 LAB — ALT: ALT: 10 U/L (ref 0–35)

## 2012-01-21 MED ORDER — HYDROCODONE-ACETAMINOPHEN 2.5-500 MG PO TABS: ORAL_TABLET | ORAL | Status: DC

## 2012-01-21 MED ORDER — AZITHROMYCIN 250 MG PO TABS: ORAL_TABLET | ORAL | Status: AC

## 2012-01-21 NOTE — Assessment & Plan Note (Signed)
Chronic issue, diagnosed with spinal stenosis by MRI 2009. On chronic vicodin Refills provided.

## 2012-01-21 NOTE — Patient Instructions (Addendum)
Rest, fluids , tylenol For cough, take Mucinex DM twice a day as needed  Continue using Ventolin and as needed for wheezing and chest congestion. Remember that Qvar is for everyday use Take the antibiotic as prescribed  (zitrhomax) Call if no better in few days Call anytime if the symptoms are severe ----------------------------------

## 2012-01-21 NOTE — Assessment & Plan Note (Signed)
Moderate exacerbation for the last week. Coughing green sputum. Will get antibiotics, see instructions

## 2012-01-21 NOTE — Assessment & Plan Note (Signed)
No ambulatory blood pressures but BP today okay. Check a BMP

## 2012-01-21 NOTE — Assessment & Plan Note (Signed)
Patient is having increasing difficulties with activities of daily living. See social history. Referral to [facility] done

## 2012-01-21 NOTE — Progress Notes (Signed)
  Subjective:    Patient ID: Dawn Morrow, female    DOB: 07/11/1936, 76 y.o.   MRN: [id number]  HPI Routine office visit Cholesterol, good medication compliance. History of osteopenia, takes Actonel once a month, admits that she forgets sometimes. Hypertension, good medication compliance, no ambulatory BPs Developed a cold 1 week ago, taking Mucinex for the last 2 days, initially had fever but that resolved. Cough has improved a little in the last day or 2. She is coughing up green sputum sometimes. Prior to the  cold, her respiratory symptoms were okay. Also, she is getting gradually weaker and she thinks needs in-house help. See social history. A referral for personal care services is requested  Past Medical History:  AODM  Hypertension  Asthma, COPD  Depression  Osteopenia  hypothyroidism  Hyperlipidemia  GERD  Tremor (dx at a young age  Hematology  --Multiple DVTs before, on coumadin  --Pulmonary embolism, hx of x 2  --increased homocysreine levels  Osteoarthritis (h/o spinal stenosis by MRI 2009, ongoing back pain)  Past Surgical History:  Appendectomy  Cholecystectomy  Lumbar fusion, in the 90s  Tubal ligation  Tonsillectomy  Family History:  colon ca-- biological M  breast ca-- no but patient not sure  DM-- M  CAD-- no  Social History:  Single, lives by herself, lost her sister in 2012. 3 children , all live in [place], lives in [place] Currently needs help with grocery shopping and her finances, [name] helps her. Having increasing difficulties with  ADLs such as taking showers. Only eats microwave food. Uses a diaper. Doesn't drive  tobacco--no currently a smoker  ETOH-- socially   Review of Systems No chest pain, mild shortness of breath with the cold. No nausea, vomiting, diarrhea    Objective:   Physical Exam  General -- alert, well-developed, and overweight appearing. No apparent distress.  HEENT -- nose moderately congested  Lungs --  normal respiratory effort, no intercostal retractions, no accessory muscle use, decreased breath sounds, few rhonchi. No wheezing.   Heart-- normal rate, regular rhythm, no murmur, and no gallop.   Abdomen--soft, non-tender, no distention, no masses, no HSM, no guarding, and no rigidity.   Extremities-- no pretibial edema bilaterally  Neurologic-- alert & oriented X3 , moderate   action tremor noted. Not a new finding Psych-- not anxious appearing and not depressed appearing.      Assessment & Plan:  Today , I spent more than 45 min with the patient, >50% of the time counseling, and assessing her ADL (see SH) and preparing referral for [facility]

## 2012-01-21 NOTE — Assessment & Plan Note (Signed)
Good medication compliance, labs 

## 2012-01-21 NOTE — Assessment & Plan Note (Signed)
Unable to see the Coumadin clinic due to transportation issues, we'll check an INR and send it to the Coumadin clinic.

## 2012-01-21 NOTE — Assessment & Plan Note (Signed)
On diet only, labs 

## 2012-01-21 NOTE — Assessment & Plan Note (Signed)
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

## 2012-01-21 NOTE — Assessment & Plan Note (Signed)
Due for labs

## 2012-01-25 ENCOUNTER — Ambulatory Visit: Payer: Medicare Other | Admitting: Internal Medicine

## 2012-01-27 ENCOUNTER — Ambulatory Visit: Payer: Self-pay | Admitting: Cardiology

## 2012-01-27 DIAGNOSIS — Z7901 Long term (current) use of anticoagulants: Secondary | ICD-10-CM

## 2012-01-27 DIAGNOSIS — I82409 Acute embolism and thrombosis of unspecified deep veins of unspecified lower extremity: Secondary | ICD-10-CM

## 2012-01-27 DIAGNOSIS — I2699 Other pulmonary embolism without acute cor pulmonale: Secondary | ICD-10-CM

## 2012-01-27 LAB — PROTIME-INR: INR: 1.3 — AB (ref <=1.1)

## 2012-01-29 ENCOUNTER — Encounter: Payer: Self-pay | Admitting: *Deleted

## 2012-02-03 ENCOUNTER — Telehealth: Payer: Self-pay | Admitting: Internal Medicine

## 2012-02-03 NOTE — Telephone Encounter (Signed)
Malachi Bonds from [facility] called stating she needs to know how long the pt has been seeing Dr. Drue Novel and had some other questions as well regarding the patient. Call back # [phone] (mobile) or [phone] (office)

## 2012-02-03 NOTE — Telephone Encounter (Signed)
Discussed with gloria.

## 2012-02-05 ENCOUNTER — Ambulatory Visit (INDEPENDENT_AMBULATORY_CARE_PROVIDER_SITE_OTHER): Payer: Medicare Other

## 2012-02-05 DIAGNOSIS — I82409 Acute embolism and thrombosis of unspecified deep veins of unspecified lower extremity: Secondary | ICD-10-CM

## 2012-02-05 DIAGNOSIS — Z7901 Long term (current) use of anticoagulants: Secondary | ICD-10-CM

## 2012-02-05 DIAGNOSIS — I2699 Other pulmonary embolism without acute cor pulmonale: Secondary | ICD-10-CM

## 2012-02-05 LAB — POCT INR: INR: 1.4

## 2012-02-05 LAB — HM DIABETES EYE EXAM: HM Diabetic Eye Exam: NEGATIVE

## 2012-02-09 ENCOUNTER — Encounter: Payer: Self-pay | Admitting: *Deleted

## 2012-03-30 ENCOUNTER — Telehealth: Payer: Self-pay | Admitting: Internal Medicine

## 2012-03-30 NOTE — Telephone Encounter (Signed)
Ok to have labs done on [facility]? If so, what lab orders do i need to put in?

## 2012-03-30 NOTE — Telephone Encounter (Signed)
Notified coumadin clinic on [address]

## 2012-03-30 NOTE — Telephone Encounter (Signed)
Okay to do her labs : FLP --- dx high cholesterol CBC-- dx hypertension TSH-- dx hypothyroidism Vitamin D --- dx vitamin D deficiency

## 2012-03-30 NOTE — Telephone Encounter (Signed)
Tora Perches called regarding appt for this pt. CPE is rescheduled for 05/30/12. She would like to know if patient can have her physical labs done when she goes to the coumadin clinic on [facility].

## 2012-04-06 NOTE — Telephone Encounter (Signed)
Called patient's niece, [name], to let her know the pt could have the labs done at coumadin clinic.

## 2012-04-13 ENCOUNTER — Other Ambulatory Visit: Payer: Self-pay | Admitting: *Deleted

## 2012-04-13 ENCOUNTER — Ambulatory Visit (INDEPENDENT_AMBULATORY_CARE_PROVIDER_SITE_OTHER): Payer: Medicare Other | Admitting: Pharmacist

## 2012-04-13 ENCOUNTER — Ambulatory Visit (INDEPENDENT_AMBULATORY_CARE_PROVIDER_SITE_OTHER): Payer: Medicare Other | Admitting: *Deleted

## 2012-04-13 DIAGNOSIS — E559 Vitamin D deficiency, unspecified: Secondary | ICD-10-CM

## 2012-04-13 DIAGNOSIS — E039 Hypothyroidism, unspecified: Secondary | ICD-10-CM

## 2012-04-13 DIAGNOSIS — I10 Essential (primary) hypertension: Secondary | ICD-10-CM

## 2012-04-13 DIAGNOSIS — E785 Hyperlipidemia, unspecified: Secondary | ICD-10-CM

## 2012-04-13 DIAGNOSIS — I82409 Acute embolism and thrombosis of unspecified deep veins of unspecified lower extremity: Secondary | ICD-10-CM

## 2012-04-13 DIAGNOSIS — Z7901 Long term (current) use of anticoagulants: Secondary | ICD-10-CM

## 2012-04-13 DIAGNOSIS — I2699 Other pulmonary embolism without acute cor pulmonale: Secondary | ICD-10-CM

## 2012-04-13 LAB — CBC WITH DIFFERENTIAL/PLATELET
Basophils Absolute: 0.1 10*3/uL (ref 0.0–0.1)
Basophils Relative: 0.7 % (ref 0.0–3.0)
Eosinophils Absolute: 0.3 10*3/uL (ref 0.0–0.7)
Eosinophils Relative: 4.1 % (ref 0.0–5.0)
HCT: 41.7 % (ref 36.0–46.0)
Hemoglobin: 13.5 g/dL (ref 12.0–15.0)
Lymphocytes Relative: 21.2 % (ref 12.0–46.0)
Lymphs Abs: 1.8 10*3/uL (ref 0.7–4.0)
MCHC: 32.3 g/dL (ref 30.0–36.0)
MCV: 90.9 fl (ref 78.0–100.0)
Monocytes Absolute: 0.6 10*3/uL (ref 0.1–1.0)
Monocytes Relative: 7.3 % (ref 3.0–12.0)
Neutro Abs: 5.6 10*3/uL (ref 1.4–7.7)
Neutrophils Relative %: 66.7 % (ref 43.0–77.0)
Platelets: 198 10*3/uL (ref 150.0–400.0)
RBC: 4.59 Mil/uL (ref 3.87–5.11)
RDW: 16.8 % — ABNORMAL HIGH (ref 11.5–14.6)
WBC: 8.4 10*3/uL (ref 4.5–10.5)

## 2012-04-13 LAB — LIPID PANEL
Cholesterol: 258 mg/dL — ABNORMAL HIGH (ref 0–200)
HDL: 45.7 mg/dL (ref >39.00)
Total CHOL/HDL Ratio: 6
Triglycerides: 209 mg/dL — ABNORMAL HIGH (ref 0.0–149.0)
VLDL: 41.8 mg/dL — ABNORMAL HIGH (ref 0.0–40.0)

## 2012-04-13 LAB — LDL CHOLESTEROL, DIRECT: Direct LDL: 174.3 mg/dL

## 2012-04-13 LAB — POCT INR: INR: 4.7

## 2012-04-13 LAB — TSH: TSH: 3.05 u[IU]/mL (ref 0.35–5.50)

## 2012-04-14 LAB — VITAMIN D 25 HYDROXY (VIT D DEFICIENCY, FRACTURES): Vit D, 25-Hydroxy: 39 ng/mL (ref 30–89)

## 2012-04-15 ENCOUNTER — Other Ambulatory Visit: Payer: Self-pay | Admitting: Internal Medicine

## 2012-04-15 NOTE — Telephone Encounter (Signed)
Refill done.  

## 2012-04-20 ENCOUNTER — Ambulatory Visit (INDEPENDENT_AMBULATORY_CARE_PROVIDER_SITE_OTHER): Payer: Medicare Other | Admitting: *Deleted

## 2012-04-20 ENCOUNTER — Telehealth: Payer: Self-pay

## 2012-04-20 DIAGNOSIS — Z7901 Long term (current) use of anticoagulants: Secondary | ICD-10-CM

## 2012-04-20 DIAGNOSIS — I2699 Other pulmonary embolism without acute cor pulmonale: Secondary | ICD-10-CM

## 2012-04-20 DIAGNOSIS — E785 Hyperlipidemia, unspecified: Secondary | ICD-10-CM

## 2012-04-20 DIAGNOSIS — I82409 Acute embolism and thrombosis of unspecified deep veins of unspecified lower extremity: Secondary | ICD-10-CM

## 2012-04-20 LAB — POCT INR: INR: 2.9

## 2012-04-20 MED ORDER — EZETIMIBE 10 MG PO TABS: 10.0000 mg | ORAL_TABLET | Freq: Every day | ORAL | Status: DC

## 2012-04-20 NOTE — Telephone Encounter (Signed)
Message copied by Maurice Small on Wed Apr 20, 2012  5:15 PM ------      Message from: Lelon Perla      Created: Mon Apr 18, 2012 10:00 PM       Cholesterol has increased--- is pt taking the lipitor?  If yes --- add zetia 10 mg daily      Recheck 3 months-----272.4  Lipid, hep

## 2012-04-20 NOTE — Telephone Encounter (Signed)
Spoke with patient, patient is currently taking Lipitor. Rx for Zetia sent to [facility], placed order for future labs. Patient stated her niece schedules her appointment's and she will have to check with her and call back to set up fasting labs in 3 months. Copy of labs to be mailed to patient

## 2012-04-27 ENCOUNTER — Encounter: Payer: Medicare Other | Admitting: Internal Medicine

## 2012-05-04 ENCOUNTER — Ambulatory Visit (INDEPENDENT_AMBULATORY_CARE_PROVIDER_SITE_OTHER): Payer: Medicare Other | Admitting: Pharmacist

## 2012-05-04 DIAGNOSIS — I82409 Acute embolism and thrombosis of unspecified deep veins of unspecified lower extremity: Secondary | ICD-10-CM

## 2012-05-04 DIAGNOSIS — I2699 Other pulmonary embolism without acute cor pulmonale: Secondary | ICD-10-CM

## 2012-05-04 DIAGNOSIS — Z7901 Long term (current) use of anticoagulants: Secondary | ICD-10-CM

## 2012-05-04 LAB — POCT INR: INR: 5.9

## 2012-05-08 ENCOUNTER — Other Ambulatory Visit: Payer: Self-pay | Admitting: Internal Medicine

## 2012-05-10 NOTE — Telephone Encounter (Signed)
Refill done.  

## 2012-05-12 ENCOUNTER — Ambulatory Visit (INDEPENDENT_AMBULATORY_CARE_PROVIDER_SITE_OTHER): Payer: Medicare Other | Admitting: *Deleted

## 2012-05-12 DIAGNOSIS — Z7901 Long term (current) use of anticoagulants: Secondary | ICD-10-CM

## 2012-05-12 DIAGNOSIS — I2699 Other pulmonary embolism without acute cor pulmonale: Secondary | ICD-10-CM

## 2012-05-12 DIAGNOSIS — I82409 Acute embolism and thrombosis of unspecified deep veins of unspecified lower extremity: Secondary | ICD-10-CM

## 2012-05-12 LAB — POCT INR: INR: 2.9

## 2012-05-26 ENCOUNTER — Ambulatory Visit (INDEPENDENT_AMBULATORY_CARE_PROVIDER_SITE_OTHER): Payer: Medicare Other

## 2012-05-26 DIAGNOSIS — Z7901 Long term (current) use of anticoagulants: Secondary | ICD-10-CM

## 2012-05-26 DIAGNOSIS — I2699 Other pulmonary embolism without acute cor pulmonale: Secondary | ICD-10-CM

## 2012-05-26 DIAGNOSIS — I82409 Acute embolism and thrombosis of unspecified deep veins of unspecified lower extremity: Secondary | ICD-10-CM

## 2012-05-26 LAB — POCT INR: INR: 2.7

## 2012-05-30 ENCOUNTER — Ambulatory Visit (INDEPENDENT_AMBULATORY_CARE_PROVIDER_SITE_OTHER): Payer: Medicare Other | Admitting: Internal Medicine

## 2012-05-30 VITALS — BP 144/84 | HR 104 | Temp 98.3°F | Wt 213.0 lb

## 2012-05-30 DIAGNOSIS — E039 Hypothyroidism, unspecified: Secondary | ICD-10-CM

## 2012-05-30 DIAGNOSIS — M810 Age-related osteoporosis without current pathological fracture: Secondary | ICD-10-CM

## 2012-05-30 DIAGNOSIS — E785 Hyperlipidemia, unspecified: Secondary | ICD-10-CM

## 2012-05-30 DIAGNOSIS — R0609 Other forms of dyspnea: Secondary | ICD-10-CM

## 2012-05-30 DIAGNOSIS — R0989 Other specified symptoms and signs involving the circulatory and respiratory systems: Secondary | ICD-10-CM

## 2012-05-30 DIAGNOSIS — F329 Major depressive disorder, single episode, unspecified: Secondary | ICD-10-CM

## 2012-05-30 DIAGNOSIS — J449 Chronic obstructive pulmonary disease, unspecified: Secondary | ICD-10-CM

## 2012-05-30 DIAGNOSIS — I10 Essential (primary) hypertension: Secondary | ICD-10-CM

## 2012-05-30 DIAGNOSIS — R0600 Dyspnea, unspecified: Secondary | ICD-10-CM

## 2012-05-30 DIAGNOSIS — E119 Type 2 diabetes mellitus without complications: Secondary | ICD-10-CM

## 2012-05-30 DIAGNOSIS — Z23 Encounter for immunization: Secondary | ICD-10-CM

## 2012-05-30 DIAGNOSIS — Z0000 Encounter for general adult medical examination without abnormal findings: Secondary | ICD-10-CM | POA: Insufficient documentation

## 2012-05-30 DIAGNOSIS — M899 Disorder of bone, unspecified: Secondary | ICD-10-CM

## 2012-05-30 DIAGNOSIS — R627 Adult failure to thrive: Secondary | ICD-10-CM

## 2012-05-30 DIAGNOSIS — M949 Disorder of cartilage, unspecified: Secondary | ICD-10-CM

## 2012-05-30 NOTE — Assessment & Plan Note (Signed)
History of osteoporosis per bone density test 10/14/2009 On Boniva for ~ 5 years (compliance?)  Plan: D/C Boniva Check a bone density test

## 2012-05-30 NOTE — Assessment & Plan Note (Signed)
A1c is has been stable over time, recheck an A1c

## 2012-05-30 NOTE — Assessment & Plan Note (Addendum)
Td 09 Pneumovax  2005 and 2010 Shingles shot-- states she got it already Flu shot today Pap Smear declined further PAPs Mammogram: 11-2010 neg SBE encouraged Cscope 07-08-05 colon polyp, int hemorroids, TICs, Bx tubular adenomas; repeated Cscope 9-12, multiple polyps, next 3 years per report  Diet and exercise discussed

## 2012-05-30 NOTE — Assessment & Plan Note (Signed)
Last TSH is slightly elevated, recheck 

## 2012-05-30 NOTE — Assessment & Plan Note (Addendum)
Long h/o dyspnea on exertion, on exam she has decreased breath sounds but is otherwise normal, denies chest pain. EKG today is at baseline, no acute changes. Most likely dyspnea on exertion is multifactorial including deconditioning. Encouraged to stay active

## 2012-05-30 NOTE — Progress Notes (Signed)
  Subjective:    Patient ID: Dawn Morrow, female    DOB: [birth year], 76 y.o.   MRN: [id number]  HPI Here w/ her nice sherry for a Medicare AWV: 1. Risk factors based on Past M, S, F history: reviewed  2. Physical Activities: sedentary    3. Depression/mood: no problems reported or noted  4. Hearing: no problems reported or noted  5. ADL's: independent on most ADL, family helps w/ finances  6. Fall Risk: increased risk, had few falls before got an aid for showers, prevention discussed 7. Home Safety: does feel safe at home  8. Height, weight, &visual acuity: see VS, vision ok w/ glasses  9. Counseling: yes  10. Labs ordered based on risk factors: yes  11.           Referral Coordination, if needed  12.           Care Plan, see a/p  13.            Cognitive Assessment:  her balance is somehow impaired due to tremors. her cognition  is        at   baseline today   Also discussed the following: Failure to thrive, the patient lives by herself, see social history. Currently getting some help, she is unsure about what medicines  she takes. Dawn Morrow also reports DOE to few steps , and further questioning, this is going on for a while. The patient admits that she is quite inactive. Patient states that she doesn't feel that great, she can't be more specific although she mentioned she gets depressed sometimes.  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 3 children , all live in [place], lives in [place] ; nice [place] in [place] [phone], [phone]) Currently needs help with grocery shopping, taking showers, cooking --> has an aid; also  Dawn Morrow helps her w/ paperwork and finances.   Uses a diaper.  Doesn't drive  tobacco--no currently a smoker  ETOH-- socially   Review of Systems No chest pain, no lower extremity edema. No nausea vomiting, occasional diarrhea without blood. No dysuria gross hematuria Occasional cough with very little sputum production.     Objective:   Physical Exam  General -- alert, well-developed, and overweight appearing. No apparent distress.  Neck-- no thyromegaly, normal carotid pulses.  Lungs -- decreased breath sounds otherwise normal Heart-- normal rate, regular rhythm, no murmur, and no gallop. No JVD at 45  Abdomen--soft, non-tender, no distention, no masses, no HSM, no guarding, and no rigidity.   Extremities-- no pretibial edema bilaterally  Neurologic-- alert & oriented X3 ,+++ action tremor noted  Psych--  not anxious appearing and not depressed appearing.       Assessment & Plan:

## 2012-05-30 NOTE — Assessment & Plan Note (Addendum)
Unclear if she's taking her medication as prescribed.  Last cholesterol was elevated  consequently Zetia was added. Plan: Medication compliance, see instructions

## 2012-05-30 NOTE — Patient Instructions (Addendum)
Please ask a nurse that visit you to go over the medication list below to be sure you take the medicines as prescribed. Please come back in 3 months, fasting. We'll schedule a bone density test

## 2012-05-30 NOTE — Assessment & Plan Note (Signed)
No change for now. 

## 2012-05-30 NOTE — Assessment & Plan Note (Signed)
Reports occasional depression, today seems at baseline. No change for now

## 2012-05-30 NOTE — Assessment & Plan Note (Signed)
Currently getting the help she needs. Request a shower seat prescription, done. I'm concern about her medication compliance, I discussed this with the patient and her niece, a medication list was provided to each one of them and asked them to  discuss medication compliance with the nurse that visit her.

## 2012-05-31 ENCOUNTER — Encounter: Payer: Self-pay | Admitting: Internal Medicine

## 2012-06-03 ENCOUNTER — Other Ambulatory Visit: Payer: Self-pay | Admitting: Internal Medicine

## 2012-06-03 NOTE — Telephone Encounter (Signed)
done

## 2012-06-03 NOTE — Telephone Encounter (Signed)
Ok to refill 

## 2012-06-06 ENCOUNTER — Telehealth: Payer: Self-pay | Admitting: Internal Medicine

## 2012-06-06 NOTE — Telephone Encounter (Signed)
Several labs were order 05/31/2012, I don't believe they were done. Please call the patient and schedule labs

## 2012-06-07 NOTE — Telephone Encounter (Signed)
lmovm for pt to call office. °

## 2012-06-09 ENCOUNTER — Other Ambulatory Visit: Payer: Medicare Other

## 2012-06-10 ENCOUNTER — Telehealth: Payer: Self-pay | Admitting: Internal Medicine

## 2012-06-10 NOTE — Telephone Encounter (Signed)
Is okay, just see the office visit note and enter the orders

## 2012-06-10 NOTE — Telephone Encounter (Signed)
Discussed with pt's nephew.

## 2012-06-10 NOTE — Telephone Encounter (Signed)
Please advise 

## 2012-06-10 NOTE — Telephone Encounter (Signed)
Opened in error

## 2012-06-10 NOTE — Telephone Encounter (Signed)
Called pt and spoke with nephew, Lyn Hollingshead. He would like to know if the patient can have her labs done at the cardiology office on 10/17 when she goes for her coumadin check. Please advise.

## 2012-06-13 ENCOUNTER — Ambulatory Visit
Admission: RE | Admit: 2012-06-13 | Discharge: 2012-06-13 | Disposition: A | Discharge status: Home / Self Care | Payer: Medicare Other | Source: Ambulatory Visit | Attending: Internal Medicine | Admitting: Internal Medicine

## 2012-06-13 DIAGNOSIS — M810 Age-related osteoporosis without current pathological fracture: Secondary | ICD-10-CM

## 2012-06-16 ENCOUNTER — Telehealth: Payer: Self-pay | Admitting: Internal Medicine

## 2012-06-16 NOTE — Telephone Encounter (Signed)
(   Has taken Boniva or Actonel for the last few years, good compliance?) Advise patient, bone density test continue showing osteoporosis: Her options are  Prolia a shot every 6 months Forteo , a daily injections  Please schedule a visit to discuss

## 2012-06-16 NOTE — Telephone Encounter (Signed)
lmovm for pt to return call.  

## 2012-06-16 NOTE — Telephone Encounter (Deleted)
Caller: Sherri/Other; Patient Name: Dawn Morrow; PCP: Willow Ora; Best Callback Phone Number: [phone].  Pt. returning Lindsay's call.  Pt. instructed to call office tomorrow when office re-opens and Lillia Abed will discuss different options for osteoporosis for the pt.  CAN/db.

## 2012-06-16 NOTE — Telephone Encounter (Signed)
Notation missing

## 2012-06-21 ENCOUNTER — Other Ambulatory Visit: Payer: Self-pay | Admitting: Internal Medicine

## 2012-06-21 NOTE — Telephone Encounter (Signed)
Refill done.  

## 2012-06-22 ENCOUNTER — Encounter: Payer: Self-pay | Admitting: Internal Medicine

## 2012-06-23 NOTE — Telephone Encounter (Signed)
Discussed with pt's nephew. appt made for 10.23.

## 2012-06-29 ENCOUNTER — Other Ambulatory Visit: Payer: Medicare Other

## 2012-06-29 ENCOUNTER — Encounter: Payer: Self-pay | Admitting: Internal Medicine

## 2012-06-29 ENCOUNTER — Other Ambulatory Visit (INDEPENDENT_AMBULATORY_CARE_PROVIDER_SITE_OTHER): Payer: Medicare Other

## 2012-06-29 ENCOUNTER — Ambulatory Visit (INDEPENDENT_AMBULATORY_CARE_PROVIDER_SITE_OTHER): Payer: Medicare Other | Admitting: *Deleted

## 2012-06-29 ENCOUNTER — Ambulatory Visit (INDEPENDENT_AMBULATORY_CARE_PROVIDER_SITE_OTHER): Payer: Medicare Other | Admitting: Internal Medicine

## 2012-06-29 VITALS — BP 128/74 | HR 84 | Temp 98.1°F | Wt 214.0 lb

## 2012-06-29 DIAGNOSIS — M810 Age-related osteoporosis without current pathological fracture: Secondary | ICD-10-CM

## 2012-06-29 DIAGNOSIS — Z7901 Long term (current) use of anticoagulants: Secondary | ICD-10-CM

## 2012-06-29 DIAGNOSIS — R251 Tremor, unspecified: Secondary | ICD-10-CM

## 2012-06-29 DIAGNOSIS — I82409 Acute embolism and thrombosis of unspecified deep veins of unspecified lower extremity: Secondary | ICD-10-CM

## 2012-06-29 DIAGNOSIS — R259 Unspecified abnormal involuntary movements: Secondary | ICD-10-CM

## 2012-06-29 DIAGNOSIS — I2699 Other pulmonary embolism without acute cor pulmonale: Secondary | ICD-10-CM

## 2012-06-29 DIAGNOSIS — E785 Hyperlipidemia, unspecified: Secondary | ICD-10-CM

## 2012-06-29 LAB — HEPATIC FUNCTION PANEL
ALT: 26 U/L (ref 0–35)
AST: 35 U/L (ref 0–37)
Albumin: 3.4 g/dL — ABNORMAL LOW (ref 3.5–5.2)
Alkaline Phosphatase: 84 U/L (ref 39–117)
Bilirubin, Direct: 0.1 mg/dL (ref 0.0–0.3)
Total Bilirubin: 0.7 mg/dL (ref 0.3–1.2)
Total Protein: 7 g/dL (ref 6.0–8.3)

## 2012-06-29 LAB — LDL CHOLESTEROL, DIRECT: Direct LDL: 170 mg/dL

## 2012-06-29 LAB — LIPID PANEL
Cholesterol: 226 mg/dL — ABNORMAL HIGH (ref 0–200)
HDL: 39.8 mg/dL (ref >39.00)
Total CHOL/HDL Ratio: 6
Triglycerides: 156 mg/dL — ABNORMAL HIGH (ref 0.0–149.0)
VLDL: 31.2 mg/dL (ref 0.0–40.0)

## 2012-06-29 LAB — POCT INR: INR: 1.4

## 2012-06-29 NOTE — Assessment & Plan Note (Addendum)
Last DEXA continue showing osteoporosis, unclear Boniva or other meds  compliance in the last 5 years. We discussed options including injectables v reclast. She likes to proceed with a class. Plan: BMP Schedule reclast (will contact [name])

## 2012-06-29 NOTE — Assessment & Plan Note (Addendum)
Long history of tremor, currently on Coreg for hypertension. Pt would like some treatment, symptoms are slightly increasing and she has a difficult time typing. She has a history of asthma, thus  I won't switch her to propranolol. Plan: Trial with Neurontin 300 mg twice a day.

## 2012-06-29 NOTE — Patient Instructions (Addendum)
Start taking gabapentin one tablet twice a day, may increase to 3 times a day if needed for tremors. Call if side effects

## 2012-06-29 NOTE — Progress Notes (Signed)
  Subjective:    Patient ID: Dawn Morrow, female    DOB: 09/12/1935, 76 y.o.   MRN: [id number]  HPI Here to discuss the bone density test Additionally, her tremors are bothering her more, but would like some treatment.  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 3 children , all live in [place], lives in [place] ; nice [place] in [place] [phone], [phone]) Currently needs help with grocery shopping, taking showers, cooking --> has an aid; also  [name] helps her w/ paperwork and finances.    Uses a diaper.   Doesn't drive   tobacco--no currently a smoker   ETOH-- socially     Review of Systems     Objective:   Physical Exam General -- alert, well-developed, and overweight appearing. No apparent distress.   Neurologic-- tremors at baseline. Psych--  not anxious appearing and not depressed appearing.        Assessment & Plan:  Unable to do a detailed medication review, the patient states that she takes her medications everyday according to the list  we gave her    Today , I spent more than 20  min with the patient, >50% of the time counseling

## 2012-06-30 LAB — BASIC METABOLIC PANEL
BUN: 18 mg/dL (ref 6–23)
CO2: 32 mEq/L (ref 19–32)
Calcium: 9 mg/dL (ref 8.4–10.5)
Chloride: 104 mEq/L (ref 96–112)
Creatinine, Ser: 1.5 mg/dL — ABNORMAL HIGH (ref 0.4–1.2)
GFR: 36.05 mL/min — ABNORMAL LOW (ref >60.00)
Glucose, Bld: 92 mg/dL (ref 70–99)
Potassium: 4.2 mEq/L (ref 3.5–5.1)
Sodium: 143 mEq/L (ref 135–145)

## 2012-07-01 ENCOUNTER — Ambulatory Visit: Payer: Medicare Other | Admitting: Internal Medicine

## 2012-07-03 ENCOUNTER — Other Ambulatory Visit: Payer: Self-pay | Admitting: Internal Medicine

## 2012-07-04 NOTE — Telephone Encounter (Signed)
Refill done.  

## 2012-07-05 ENCOUNTER — Other Ambulatory Visit: Payer: Self-pay | Admitting: *Deleted

## 2012-07-05 DIAGNOSIS — M810 Age-related osteoporosis without current pathological fracture: Secondary | ICD-10-CM

## 2012-07-07 MED ORDER — ZOLEDRONIC ACID 5 MG/100ML IV SOLN: 5.0000 mg | Freq: Once | INTRAVENOUS | Status: DC

## 2012-07-07 NOTE — Addendum Note (Signed)
Addended by: Edwena Felty T on: 07/07/2012 02:24 PM   Modules accepted: Orders

## 2012-07-27 ENCOUNTER — Telehealth: Payer: Self-pay | Admitting: *Deleted

## 2012-07-27 NOTE — Telephone Encounter (Signed)
Pt's insurance was contacted in reference to covering reclast. Medicare no longer covers reclast for pt. Please advise.

## 2012-07-31 ENCOUNTER — Other Ambulatory Visit: Payer: Self-pay | Admitting: Family Medicine

## 2012-07-31 ENCOUNTER — Other Ambulatory Visit: Payer: Self-pay | Admitting: Internal Medicine

## 2012-08-01 NOTE — Telephone Encounter (Signed)
Refill done.  

## 2012-08-05 NOTE — Telephone Encounter (Signed)
lmovm for pt to return call.  

## 2012-08-05 NOTE — Telephone Encounter (Addendum)
reclasr not covered, recommend prolia, one shot every 6 months. Please mail the information about prolia I just printed from up-to-date.  If the patient is interested she needs to let us know ASAP. Of his visit if she likes to discuss further appear

## 2012-08-16 ENCOUNTER — Other Ambulatory Visit: Payer: Self-pay | Admitting: Internal Medicine

## 2012-08-16 NOTE — Telephone Encounter (Signed)
Last filled 9.27.13. Last OV 9.23.13. OK to refill?

## 2012-08-16 NOTE — Telephone Encounter (Signed)
done

## 2012-08-19 ENCOUNTER — Ambulatory Visit (INDEPENDENT_AMBULATORY_CARE_PROVIDER_SITE_OTHER): Payer: Medicare Other | Admitting: *Deleted

## 2012-08-19 DIAGNOSIS — Z7901 Long term (current) use of anticoagulants: Secondary | ICD-10-CM

## 2012-08-19 DIAGNOSIS — I2699 Other pulmonary embolism without acute cor pulmonale: Secondary | ICD-10-CM

## 2012-08-19 DIAGNOSIS — I82409 Acute embolism and thrombosis of unspecified deep veins of unspecified lower extremity: Secondary | ICD-10-CM

## 2012-08-19 LAB — POCT INR: INR: 1.6

## 2012-08-22 ENCOUNTER — Telehealth: Payer: Self-pay | Admitting: Internal Medicine

## 2012-08-22 ENCOUNTER — Ambulatory Visit (INDEPENDENT_AMBULATORY_CARE_PROVIDER_SITE_OTHER): Payer: Medicare Other | Admitting: Internal Medicine

## 2012-08-22 ENCOUNTER — Encounter: Payer: Self-pay | Admitting: Internal Medicine

## 2012-08-22 VITALS — BP 136/74 | HR 64 | Temp 97.8°F | Wt 212.0 lb

## 2012-08-22 DIAGNOSIS — R627 Adult failure to thrive: Secondary | ICD-10-CM

## 2012-08-22 DIAGNOSIS — E119 Type 2 diabetes mellitus without complications: Secondary | ICD-10-CM

## 2012-08-22 DIAGNOSIS — R251 Tremor, unspecified: Secondary | ICD-10-CM

## 2012-08-22 DIAGNOSIS — I10 Essential (primary) hypertension: Secondary | ICD-10-CM

## 2012-08-22 DIAGNOSIS — E039 Hypothyroidism, unspecified: Secondary | ICD-10-CM

## 2012-08-22 DIAGNOSIS — R259 Unspecified abnormal involuntary movements: Secondary | ICD-10-CM

## 2012-08-22 DIAGNOSIS — M810 Age-related osteoporosis without current pathological fracture: Secondary | ICD-10-CM

## 2012-08-22 DIAGNOSIS — E785 Hyperlipidemia, unspecified: Secondary | ICD-10-CM

## 2012-08-22 NOTE — Patient Instructions (Addendum)
Please see if they lady that help you with home chores could help you with your medications. You need to take the medicines according to the list you have. Please come back in 2 months.

## 2012-08-22 NOTE — Assessment & Plan Note (Addendum)
Unable to confirm  what medications she is on, it is a difficult situation, the family does not live close enough to help her every day. Plan: See instructions, will see if the aid that help with home chores could help her w/ medication compliance. Call [facility] see if they can help

## 2012-08-22 NOTE — Assessment & Plan Note (Signed)
Reports a gabapentin did not help, we discussed possible referral to neurology, patient not interested, "I don't have sx every day". Plan: Discontinue gabapentin

## 2012-08-22 NOTE — Telephone Encounter (Signed)
Please arrange for a PROLIA injection every 6 months, DX osteoporosis. Also call [facility], could  they send a nurse to help with medication compliance?

## 2012-08-22 NOTE — Assessment & Plan Note (Addendum)
BP today okay, check a BMP and a microalbumin. Creatinine has been gradually increasing, consider a renal ultrasound and/or a renal referral

## 2012-08-22 NOTE — Progress Notes (Signed)
  Subjective:    Patient ID: Dawn Morrow, female    DOB: Apr 24, 1936, 76 y.o.   MRN: [id number]  HPI Return visit, here w/ Dawn Morrow In general feels well, my main concern is the fact that nobody is supervising her medication intake and she is unable to confirm what she's taking. She does have an aid that helps with her home chores. Hypertension, BP okay today, reports normal ambulatory BPs when checked by her aid. Depression, symptoms are well-controlled according to the patient. Osteopenia, Medicare did not cover reclast, see assessment and plan. 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 3 children , all live in [place], lives in [place] ; nice [place] in [place] [phone], [phone]) Currently needs help with grocery shopping, taking showers, cooking --> has an aid Uses a diaper.   Doesn't drive   tobacco--no currently a smoker   ETOH-- socially    Review of Systems No chest pain or shortness of breath Had severe diarrhea and fever the last 3 days, today she feels much better. Did have nausea and some vomiting. Denies cough or shortness of breath, did have  some anterior chest pain she thinks related to vomiting.    Objective:   Physical Exam  General -- alert, well-developed  Lungs -- normal  respiratory effort, no intercostal retractions, no accessory muscle use, and normal breath sounds.   Heart-- normal rate, regular rhythm, no murmur, and no gallop.   Extremities-- no pretibial edema bilaterally  Neurologic-- alert , some tremor hands and head noted, seems at baseline. Psych--  not anxious appearing and not depressed appearing.       Assessment & Plan:

## 2012-08-22 NOTE — Assessment & Plan Note (Signed)
Last cholesterol showed poor  control however I'm not sure about medication compliance. No change for now

## 2012-08-22 NOTE — Assessment & Plan Note (Addendum)
See previous entry, reclast not covered by Medicare. We will rx prolia, she is in agreement

## 2012-08-22 NOTE — Assessment & Plan Note (Signed)
On no medications, check A1c. 

## 2012-08-23 LAB — BASIC METABOLIC PANEL
BUN: 19 mg/dL (ref 6–23)
CO2: 26 mEq/L (ref 19–32)
Calcium: 8.5 mg/dL (ref 8.4–10.5)
Chloride: 103 mEq/L (ref 96–112)
Creatinine, Ser: 1.4 mg/dL — ABNORMAL HIGH (ref 0.4–1.2)
GFR: 39.34 mL/min — ABNORMAL LOW (ref >60.00)
Glucose, Bld: 103 mg/dL — ABNORMAL HIGH (ref 70–99)
Potassium: 3.8 mEq/L (ref 3.5–5.1)
Sodium: 138 mEq/L (ref 135–145)

## 2012-08-23 LAB — HEMOGLOBIN A1C: Hgb A1c MFr Bld: 6.9 % — ABNORMAL HIGH (ref 4.6–6.5)

## 2012-08-23 LAB — MICROALBUMIN / CREATININE URINE RATIO
Creatinine,U: 329.9 mg/dL
Microalb Creat Ratio: 1.1 mg/g (ref 0.0–30.0)
Microalb, Ur: 3.7 mg/dL — ABNORMAL HIGH (ref 0.0–1.9)

## 2012-08-23 LAB — TSH: TSH: 3.27 u[IU]/mL (ref 0.35–5.50)

## 2012-08-24 NOTE — Telephone Encounter (Signed)
Sent in paperwork for insurance approval to prolia plus.  Contacted [facility] to help with medication compliance.

## 2012-08-25 ENCOUNTER — Encounter: Payer: Self-pay | Admitting: *Deleted

## 2012-09-05 ENCOUNTER — Ambulatory Visit (INDEPENDENT_AMBULATORY_CARE_PROVIDER_SITE_OTHER): Payer: Medicare Other | Admitting: *Deleted

## 2012-09-05 DIAGNOSIS — Z7901 Long term (current) use of anticoagulants: Secondary | ICD-10-CM

## 2012-09-05 DIAGNOSIS — I82409 Acute embolism and thrombosis of unspecified deep veins of unspecified lower extremity: Secondary | ICD-10-CM

## 2012-09-05 DIAGNOSIS — I2699 Other pulmonary embolism without acute cor pulmonale: Secondary | ICD-10-CM

## 2012-09-05 LAB — POCT INR: INR: 2.2

## 2012-09-13 NOTE — Telephone Encounter (Signed)
Please f/u on this.

## 2012-09-13 NOTE — Telephone Encounter (Signed)
thx

## 2012-09-13 NOTE — Telephone Encounter (Signed)
We received a summary of beneifts for the pt's prolia coverage. I called prolia to have them re-run the pt's coverage with it being a new year to see if anything has changed. We should have the results within 2-3 business days.

## 2012-09-15 ENCOUNTER — Other Ambulatory Visit: Payer: Self-pay | Admitting: Internal Medicine

## 2012-09-15 ENCOUNTER — Encounter: Payer: Self-pay | Admitting: *Deleted

## 2012-09-15 NOTE — Telephone Encounter (Signed)
Pt made aware rx is ready for pick up & needs to sign controlled substance contract.

## 2012-09-15 NOTE — Telephone Encounter (Signed)
Done, be sure she has a controlled substance agreement in place

## 2012-09-15 NOTE — Telephone Encounter (Signed)
Ok to refill? Last OV 12.1.13 Last filled 12.10.13

## 2012-09-20 ENCOUNTER — Other Ambulatory Visit: Payer: Self-pay | Admitting: Internal Medicine

## 2012-09-20 NOTE — Telephone Encounter (Signed)
Agree with your concern, please call patient and ask if she has been taking this medication

## 2012-09-20 NOTE — Telephone Encounter (Signed)
According to pt's med list this med has not been refilled since 03/2011. Please verify that the pt is still suppose to be taking this med.

## 2012-09-21 NOTE — Telephone Encounter (Signed)
lmovm for pt to return call.  

## 2012-09-25 ENCOUNTER — Telehealth: Payer: Self-pay | Admitting: Internal Medicine

## 2012-09-25 NOTE — Telephone Encounter (Signed)
Advise patient, toxicology results came back and showed no trace of hydrocodone in her system. She has been getting consistently 100 tablets a month.  Inform patient----> not further prescriptions  for hydrocodone from this office.

## 2012-09-26 NOTE — Telephone Encounter (Signed)
lmovm to return call  °

## 2012-09-29 NOTE — Telephone Encounter (Signed)
lmovm to return call  °

## 2012-09-29 NOTE — Telephone Encounter (Signed)
lmovm for pt to return call.  

## 2012-09-30 ENCOUNTER — Encounter: Payer: Self-pay | Admitting: *Deleted

## 2012-09-30 NOTE — Telephone Encounter (Signed)
Unable to get in touch with pt, mailed letter.  

## 2012-10-03 ENCOUNTER — Telehealth: Payer: Self-pay | Admitting: *Deleted

## 2012-10-03 NOTE — Telephone Encounter (Signed)
Spoke with [name], pts caregiver informed her that Dr. Drue Novel would no longer fill hydrocodone as her toxicology screen came back neg for any trace of this in her system. Pt was receiving Rx every month for #100 tabs.

## 2012-10-05 ENCOUNTER — Telehealth: Payer: Self-pay | Admitting: *Deleted

## 2012-10-05 NOTE — Telephone Encounter (Signed)
Left msg on pt's vmail to have her return call to schedule prolia injection.

## 2012-10-05 NOTE — Telephone Encounter (Signed)
lmovm for pt to return call.  

## 2012-10-06 ENCOUNTER — Encounter: Payer: Self-pay | Admitting: Internal Medicine

## 2012-10-07 NOTE — Telephone Encounter (Signed)
Unable to get in touch with pt.  Per dr. Drue Novel, refill denied.

## 2012-10-19 ENCOUNTER — Other Ambulatory Visit: Payer: Self-pay | Admitting: Internal Medicine

## 2012-10-24 ENCOUNTER — Other Ambulatory Visit: Payer: Self-pay | Admitting: Pharmacist

## 2012-10-24 ENCOUNTER — Ambulatory Visit (INDEPENDENT_AMBULATORY_CARE_PROVIDER_SITE_OTHER): Payer: Medicare Other | Admitting: Internal Medicine

## 2012-10-24 ENCOUNTER — Ambulatory Visit: Payer: Self-pay | Admitting: Cardiology

## 2012-10-24 ENCOUNTER — Telehealth: Payer: Self-pay | Admitting: *Deleted

## 2012-10-24 ENCOUNTER — Encounter: Payer: Self-pay | Admitting: Internal Medicine

## 2012-10-24 VITALS — BP 128/86 | HR 78 | Temp 97.9°F | Wt 204.0 lb

## 2012-10-24 DIAGNOSIS — I82409 Acute embolism and thrombosis of unspecified deep veins of unspecified lower extremity: Secondary | ICD-10-CM

## 2012-10-24 DIAGNOSIS — M810 Age-related osteoporosis without current pathological fracture: Secondary | ICD-10-CM

## 2012-10-24 DIAGNOSIS — I2699 Other pulmonary embolism without acute cor pulmonale: Secondary | ICD-10-CM

## 2012-10-24 DIAGNOSIS — E119 Type 2 diabetes mellitus without complications: Secondary | ICD-10-CM

## 2012-10-24 DIAGNOSIS — E785 Hyperlipidemia, unspecified: Secondary | ICD-10-CM

## 2012-10-24 DIAGNOSIS — Z7901 Long term (current) use of anticoagulants: Secondary | ICD-10-CM

## 2012-10-24 DIAGNOSIS — R627 Adult failure to thrive: Secondary | ICD-10-CM

## 2012-10-24 DIAGNOSIS — M549 Dorsalgia, unspecified: Secondary | ICD-10-CM

## 2012-10-24 LAB — POCT INR: INR: 4.4

## 2012-10-24 MED ORDER — WARFARIN SODIUM 6 MG PO TABS: ORAL_TABLET | ORAL | Status: DC

## 2012-10-24 MED ORDER — ATORVASTATIN CALCIUM 40 MG PO TABS: 40.0000 mg | ORAL_TABLET | Freq: Every day | ORAL | Status: DC

## 2012-10-24 MED ORDER — ALENDRONATE SODIUM 70 MG PO TABS: 70.0000 mg | ORAL_TABLET | ORAL | Status: DC

## 2012-10-24 NOTE — Assessment & Plan Note (Addendum)
Not taking Lipitor, reason ? Refill medications

## 2012-10-24 NOTE — Telephone Encounter (Signed)
Message copied by Jeannine Kitten on Mon Oct 24, 2012  4:13 PM ------      Message from: Willow Ora E      Created: Mon Oct 24, 2012  3:31 PM      Regarding: RE: INR       She has been consistently checking her coumadin at cardiology, I only got the INR today as a courtesy to the patient since he has some problems with transportation.      If I need to take care of this result, please  let me know      JP            ----- Message -----         From: Jeannine Kitten, RN         Sent: 10/24/2012   3:02 PM           To: Wanda Plump, MD      Subject: RE: INR                                                  Leota Sauers our pharmacist  spoke to pts nephew Lyn Hollingshead who states Dr Drue Novel office dosed coumadin  Please clarify      ----- Message -----         From: Wanda Plump, MD         Sent: 10/24/2012   1:55 PM           To: Jeannine Kitten, RN      Subject: INR                                                      Jasmine December, she requested to get an inr today, is 4.4, I told pt Ill notify you.      THX      J PAZ             ------

## 2012-10-24 NOTE — Progress Notes (Signed)
  Subjective:    Patient ID: Dawn Morrow, female    DOB: 03/05/1936, 77 y.o.   MRN: [id number]  HPI Routine followup, here w/ Dawn Morrow. In general doing well, we went over his medication list and she is taking everything except Lipitor and Qvar. Unclear why. osteoporosis, she was unable to get prolia from [facility] and is unable to afford it.  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 3 children , all live in [place], lives in [place] ; nice [place] in [place] [phone], [phone]) Currently needs help with grocery shopping, taking showers, cooking --> has an aid Uses a diaper.   Doesn't drive   tobacco--no currently a smoker   ETOH-- socially    Review of Systems Since the last time she was here, she has a person to help her daily Dawn Morrow), her blood pressure is checked frequently and is usually normal. As far as the pain medication, I reminded her that I won't prescribe any pain medicines to her based on her toxicology screening, see assessment and plan, she is okay with that.     Objective:   Physical Exam General -- alert, well-developed  Lungs -- normal respiratory effort, no intercostal retractions, no accessory muscle use, and normal breath sounds.   Heart-- normal rate, regular rhythm, no murmur, and no gallop.   Extremities-- no pretibial edema  bilaterally Psych--  not anxious appearing and not depressed appearing.       Assessment & Plan:

## 2012-10-24 NOTE — Assessment & Plan Note (Addendum)
Unable to get prolia, out-of-pocket cost is very high. We agreed on Fosamax weekly, precautions discussed with the patient and [name]

## 2012-10-24 NOTE — Assessment & Plan Note (Signed)
Urine toxicology was negative despite getting regular prescriptions for painkillers. No further prescriptions of hydrocodone from this office.

## 2012-10-24 NOTE — Telephone Encounter (Signed)
Message copied by Jeannine Kitten on Mon Oct 24, 2012  5:01 PM ------      Message from: Willow Ora E      Created: Mon Oct 24, 2012  4:48 PM      Regarding: RE: INR       Thank you very much            ----- Message -----         From: Jeannine Kitten, RN         Sent: 10/24/2012   4:11 PM           To: Wanda Plump, MD      Subject: RE: INR                                                  I have called pt and have dosed her coumadin and she has appt to see [place] on 11/07/2012       Thanks       Lelon Perla       ----- Message -----         From: Wanda Plump, MD         Sent: 10/24/2012   3:31 PM           To: Wanda Plump, MD, Jeannine Kitten, RN      Subject: RE: INR                                                  She has been consistently checking her coumadin at cardiology, I only got the INR today as a courtesy to the patient since he has some problems with transportation.      If I need to take care of this result, please  let me know      JP            ----- Message -----         From: Jeannine Kitten, RN         Sent: 10/24/2012   3:02 PM           To: Wanda Plump, MD      Subject: RE: INR                                                  Leota Sauers our pharmacist  spoke to pts nephew Lyn Hollingshead who states Dr Drue Novel office dosed coumadin  Please clarify      ----- Message -----         From: Wanda Plump, MD         Sent: 10/24/2012   1:55 PM           To: Jeannine Kitten, RN      Subject: INR  Jasmine December, she requested to get an inr today, is 4.4, I told pt Ill notify you.      THX      J PAZ                         ------

## 2012-10-24 NOTE — Assessment & Plan Note (Addendum)
She is overdue for a Coumadin check, INR today is 4.4. Will notify cardiology (Addendum, they corrected her Coumadin dose)

## 2012-10-24 NOTE — Assessment & Plan Note (Signed)
Since the last time she was here, she has a helper that visits her Monday through Friday, she helps with her medications and checked her BPs. Apparently compliance is better although she's not taking Lipitor

## 2012-10-24 NOTE — Telephone Encounter (Signed)
Done>see below

## 2012-10-24 NOTE — Assessment & Plan Note (Signed)
No change for now. 

## 2012-10-24 NOTE — Telephone Encounter (Signed)
Have called pts niece and have dosed Dawn Morrow Coumadin and she has appt for [place] to see her on  11/07/2012

## 2012-10-25 NOTE — Telephone Encounter (Signed)
Discussed prolia injection with pt @ 2.17.14 office visit & pt declined injection.

## 2012-11-23 ENCOUNTER — Other Ambulatory Visit: Payer: Self-pay

## 2012-11-25 ENCOUNTER — Other Ambulatory Visit: Payer: Self-pay | Admitting: *Deleted

## 2012-11-25 MED ORDER — WARFARIN SODIUM 6 MG PO TABS: ORAL_TABLET | ORAL | Status: DC

## 2012-12-01 ENCOUNTER — Ambulatory Visit (INDEPENDENT_AMBULATORY_CARE_PROVIDER_SITE_OTHER): Payer: Medicare Other | Admitting: *Deleted

## 2012-12-01 DIAGNOSIS — I2699 Other pulmonary embolism without acute cor pulmonale: Secondary | ICD-10-CM

## 2012-12-01 DIAGNOSIS — Z7901 Long term (current) use of anticoagulants: Secondary | ICD-10-CM

## 2012-12-01 LAB — POCT INR: INR: 3.4

## 2012-12-15 ENCOUNTER — Ambulatory Visit (INDEPENDENT_AMBULATORY_CARE_PROVIDER_SITE_OTHER): Payer: Medicare Other | Admitting: *Deleted

## 2012-12-15 DIAGNOSIS — I2699 Other pulmonary embolism without acute cor pulmonale: Secondary | ICD-10-CM

## 2012-12-15 DIAGNOSIS — Z7901 Long term (current) use of anticoagulants: Secondary | ICD-10-CM

## 2012-12-15 LAB — POCT INR: INR: 3.5

## 2012-12-25 ENCOUNTER — Other Ambulatory Visit: Payer: Self-pay | Admitting: Internal Medicine

## 2012-12-26 ENCOUNTER — Ambulatory Visit: Payer: Medicare Other | Admitting: Internal Medicine

## 2013-01-09 ENCOUNTER — Ambulatory Visit: Payer: Medicare Other | Admitting: Internal Medicine

## 2013-01-09 ENCOUNTER — Telehealth: Payer: Self-pay | Admitting: Internal Medicine

## 2013-01-09 NOTE — Telephone Encounter (Signed)
Noted, thank you

## 2013-01-09 NOTE — Telephone Encounter (Signed)
[name] (pts caregiver) states she has been unable to get in touch with the patient. When she calls there is no answer and when she goes to her house the patient is not there. It has been almost 2 weeks since she has had contact with her. Ms. Malen Gauze would like Dr. Drue Novel to know about this and that this is the reason she has cancelled 2 appointments for her.

## 2013-01-09 NOTE — Telephone Encounter (Signed)
Called all umbers we had listed and no answer. Spoke with Pt niece she stated they had called the apartment complex to see if they could make entrance. Told her to cal back to the office to keep [place] posted.

## 2013-01-11 ENCOUNTER — Other Ambulatory Visit: Payer: Self-pay | Admitting: *Deleted

## 2013-01-11 MED ORDER — WARFARIN SODIUM 6 MG PO TABS: ORAL_TABLET | ORAL | Status: DC

## 2013-01-11 NOTE — Telephone Encounter (Signed)
Pts daughter states they have spoken with the patient and she is okay.

## 2013-01-12 ENCOUNTER — Telehealth: Payer: Self-pay | Admitting: *Deleted

## 2013-01-12 NOTE — Telephone Encounter (Signed)
Received call yesterday from Dr Drue Novel office and talked with Shanda Bumps and she states pt had missed her appt with them and they were unable to reach her.Called and spoke with nephew and he states they went to her house and she was not there but they did find her at a friends home last Monday and he said that he knew she needed to be seen in coumadin clinic as well and he was going to try to get her into clinic to have INR checked. This nurse called Shanda Bumps at Dr Drue Novel office with this information as well

## 2013-01-16 ENCOUNTER — Telehealth: Payer: Self-pay | Admitting: Internal Medicine

## 2013-01-16 NOTE — Telephone Encounter (Signed)
Patient Information:  Caller Name: Trinna Post  Phone: [phone]  Patient: Dawn Morrow  Gender: Female  DOB: 15-Dec-1935  Age: 77 Years  PCP: Willow Ora  Office Follow Up:  Does the office need to follow up with this patient?: No  Instructions For The Office: N/A   Symptoms  Reason For Call & Symptoms: Appointment tomorrow 01/17/13 and going to be brought by FRIEND. Family is wanting to make sure that there are not any changes made to how medications are ordered(which pharmacy) and for Dr. Drue Novel to not take suggestions from friend unless he feels it is in Taiylor's best interest  Reviewed Health History In EMR: Yes  Reviewed Medications In EMR: Yes  Reviewed Allergies In EMR: Yes  Reviewed Surgeries / Procedures: Yes  Date of Onset of Symptoms: 01/16/2013  Guideline(s) Used:  No Protocol Available - Information Only  Disposition Per Guideline:   Home Care  Reason For Disposition Reached:   Information only question and nurse able to answer  Advice Given:  Call Back If:  New symptoms develop  You become worse.  Patient Will Follow Care Advice:  YES

## 2013-01-16 NOTE — Telephone Encounter (Signed)
Please advise 

## 2013-01-16 NOTE — Telephone Encounter (Signed)
noted 

## 2013-01-17 ENCOUNTER — Ambulatory Visit (INDEPENDENT_AMBULATORY_CARE_PROVIDER_SITE_OTHER): Payer: Medicare Other | Admitting: Internal Medicine

## 2013-01-17 VITALS — BP 142/88 | HR 72 | Wt 204.0 lb

## 2013-01-17 DIAGNOSIS — E785 Hyperlipidemia, unspecified: Secondary | ICD-10-CM

## 2013-01-17 DIAGNOSIS — E119 Type 2 diabetes mellitus without complications: Secondary | ICD-10-CM

## 2013-01-17 DIAGNOSIS — Z7901 Long term (current) use of anticoagulants: Secondary | ICD-10-CM

## 2013-01-17 DIAGNOSIS — J45909 Unspecified asthma, uncomplicated: Secondary | ICD-10-CM

## 2013-01-17 DIAGNOSIS — I10 Essential (primary) hypertension: Secondary | ICD-10-CM

## 2013-01-17 DIAGNOSIS — R627 Adult failure to thrive: Secondary | ICD-10-CM

## 2013-01-17 LAB — BASIC METABOLIC PANEL
BUN: 19 mg/dL (ref 6–23)
CO2: 30 mEq/L (ref 19–32)
Calcium: 8.9 mg/dL (ref 8.4–10.5)
Chloride: 107 mEq/L (ref 96–112)
Creatinine, Ser: 1.2 mg/dL (ref 0.4–1.2)
GFR: 45.69 mL/min — ABNORMAL LOW (ref >60.00)
Glucose, Bld: 116 mg/dL — ABNORMAL HIGH (ref 70–99)
Potassium: 4.1 mEq/L (ref 3.5–5.1)
Sodium: 142 mEq/L (ref 135–145)

## 2013-01-17 LAB — HEMOGLOBIN A1C: Hgb A1c MFr Bld: 6.9 % — ABNORMAL HIGH (ref 4.6–6.5)

## 2013-01-17 MED ORDER — LEVOTHYROXINE SODIUM 137 MCG PO TABS: ORAL_TABLET | ORAL | Status: DC

## 2013-01-17 MED ORDER — EZETIMIBE 10 MG PO TABS: ORAL_TABLET | ORAL | Status: DC

## 2013-01-17 MED ORDER — ALENDRONATE SODIUM 70 MG PO TABS: 70.0000 mg | ORAL_TABLET | ORAL | Status: DC

## 2013-01-17 MED ORDER — BUPROPION HCL ER (XL) 300 MG PO TB24: ORAL_TABLET | ORAL | Status: DC

## 2013-01-17 MED ORDER — ATORVASTATIN CALCIUM 40 MG PO TABS: 40.0000 mg | ORAL_TABLET | Freq: Every day | ORAL | Status: DC

## 2013-01-17 NOTE — Assessment & Plan Note (Signed)
Not taking Qvar daily, see instructions

## 2013-01-17 NOTE — Assessment & Plan Note (Signed)
Well-controlled? Compliance with medications unclear Plan: Labs, see instructions.

## 2013-01-17 NOTE — Progress Notes (Signed)
  Subjective:    Patient ID: Dawn Morrow, female    DOB: 06-18-1936, 77 y.o.   MRN: [id number]  HPI ROV Here w/  Trinna Post who is [name]'s husband The patient feels well, he brought a number of medication bottles that need to be a refill but other than that she's not sure about what she is taking. She did tell me  her BP one time was as high as 200/ 114, she rested, took it again and BP went back down. Also told me she's not using the inhalers as recommended however, couldn't tell me how often she has  cough.  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 3 children , all live in [place], lives in [place]; nice [place] in [place] [phone], [phone]) Currently needs help with grocery shopping, taking showers, cooking --> has an aid Uses a diaper.   Doesn't drive   tobacco--no currently a smoker   ETOH-- socially    Review of Systems No chest pain or shortness or breath per se No  nausea, vomiting, diarrhea.     Objective:   Physical Exam BP 142/88  Pulse 72  Wt 204 lb (92.534 kg)  BMI 33.95 kg/m2  SpO2 99%  General -- alert, well-developed, NAD  Lungs -- normal respiratory effort, no intercostal retractions, no accessory muscle use, and normal breath sounds.   Heart-- normal rate, regular rhythm, no murmur, and no gallop.    Extremities-- no pretibial edema  bilaterally  Psych--  not anxious appearing and not depressed appearing.      Assessment & Plan:  Today , I spent more than 25 min with the patient, >50% of the time counseling about compliance, see a/p

## 2013-01-17 NOTE — Assessment & Plan Note (Signed)
Not well-controlled per last FLP, again is unclear if she's taking her medication correctly. Advice to come back in   3 months fasting for recheck. Advised to take medications according to the medication list

## 2013-01-17 NOTE — Patient Instructions (Addendum)
Check the  blood pressure 2 or 3 times a week, be sure it is between 110/60 and 140/85. If it is consistently higher or lower, let me know If your blood pressure goes up again to the 200/114 , either call us or go to the emergency room Take the medications according to the list below. Take Qvar 2 puffs twice a day every day albuterol only as needed if cough or wheezing. The coumadin is okay continue taking the same dose. You need to be seen by cardiology in 4 weeks from now and recheck your Coumadin . Please make an appointment. Check your blood sugar once a day, at different times, call if your blood sugars are consistently more than 180. --- Next visit here in 3 months, bring all your medicine bottles with you

## 2013-01-17 NOTE — Assessment & Plan Note (Signed)
Due for a Coumadin check, history of poor compliance with followups. Since she is here we checked an INR: 2.9 Plan: no change, 4 weeks. Recommend to call cardiology in 4 weeks and make an appointment.

## 2013-01-17 NOTE — Assessment & Plan Note (Signed)
Due for labs, she reports gets ambulatory CBGs from time to time, no readings. See instructions

## 2013-01-18 ENCOUNTER — Encounter: Payer: Self-pay | Admitting: Internal Medicine

## 2013-01-18 NOTE — Assessment & Plan Note (Signed)
Poor compliance, I know her family Roanna Raider and her husband Trinna Post) Are trying to help her, they're thinking about placement, I think that is a great idea.

## 2013-01-19 ENCOUNTER — Encounter: Payer: Self-pay | Admitting: *Deleted

## 2013-02-02 ENCOUNTER — Ambulatory Visit (INDEPENDENT_AMBULATORY_CARE_PROVIDER_SITE_OTHER): Payer: Medicare Other | Admitting: Pharmacist

## 2013-02-02 DIAGNOSIS — I82409 Acute embolism and thrombosis of unspecified deep veins of unspecified lower extremity: Secondary | ICD-10-CM

## 2013-02-02 DIAGNOSIS — I2699 Other pulmonary embolism without acute cor pulmonale: Secondary | ICD-10-CM

## 2013-02-02 DIAGNOSIS — Z7901 Long term (current) use of anticoagulants: Secondary | ICD-10-CM

## 2013-02-02 LAB — POCT INR: INR: 4.4

## 2013-02-07 ENCOUNTER — Ambulatory Visit (INDEPENDENT_AMBULATORY_CARE_PROVIDER_SITE_OTHER): Payer: Medicare Other

## 2013-02-07 DIAGNOSIS — Z7901 Long term (current) use of anticoagulants: Secondary | ICD-10-CM

## 2013-02-07 DIAGNOSIS — I2699 Other pulmonary embolism without acute cor pulmonale: Secondary | ICD-10-CM

## 2013-02-07 LAB — POCT INR: INR: 3.7

## 2013-02-14 ENCOUNTER — Other Ambulatory Visit: Payer: Self-pay | Admitting: Internal Medicine

## 2013-02-14 NOTE — Telephone Encounter (Signed)
Per Dr. Drue Novel send to cardiology.

## 2013-02-15 NOTE — Telephone Encounter (Signed)
Pt takes only 3 Warfarin tablets a week and refill sent in for 30 tablets on May 7th 2014. Talked with Pharmacist and gave dosing instructions so this refill denied.

## 2013-02-17 LAB — POCT INR: INR: 2.34

## 2013-02-21 ENCOUNTER — Ambulatory Visit (INDEPENDENT_AMBULATORY_CARE_PROVIDER_SITE_OTHER): Payer: Medicare Other | Admitting: Cardiology

## 2013-02-21 DIAGNOSIS — Z7901 Long term (current) use of anticoagulants: Secondary | ICD-10-CM

## 2013-02-21 DIAGNOSIS — I2699 Other pulmonary embolism without acute cor pulmonale: Secondary | ICD-10-CM

## 2013-02-21 LAB — PROTIME-INR: INR: 2.3 — AB (ref <=1.1)

## 2013-03-26 ENCOUNTER — Other Ambulatory Visit: Payer: Self-pay | Admitting: Internal Medicine

## 2013-04-13 ENCOUNTER — Ambulatory Visit (INDEPENDENT_AMBULATORY_CARE_PROVIDER_SITE_OTHER): Payer: Medicare Other

## 2013-04-13 DIAGNOSIS — Z7901 Long term (current) use of anticoagulants: Secondary | ICD-10-CM

## 2013-04-13 DIAGNOSIS — I2699 Other pulmonary embolism without acute cor pulmonale: Secondary | ICD-10-CM

## 2013-04-13 LAB — POCT INR: INR: 1.3

## 2013-04-20 ENCOUNTER — Ambulatory Visit (INDEPENDENT_AMBULATORY_CARE_PROVIDER_SITE_OTHER): Payer: Medicare Other | Admitting: Internal Medicine

## 2013-04-20 ENCOUNTER — Encounter: Payer: Self-pay | Admitting: Internal Medicine

## 2013-04-20 VITALS — BP 140/80 | HR 84 | Temp 97.6°F | Wt 205.8 lb

## 2013-04-20 DIAGNOSIS — E119 Type 2 diabetes mellitus without complications: Secondary | ICD-10-CM

## 2013-04-20 DIAGNOSIS — R627 Adult failure to thrive: Secondary | ICD-10-CM

## 2013-04-20 DIAGNOSIS — E039 Hypothyroidism, unspecified: Secondary | ICD-10-CM

## 2013-04-20 DIAGNOSIS — I10 Essential (primary) hypertension: Secondary | ICD-10-CM

## 2013-04-20 DIAGNOSIS — E785 Hyperlipidemia, unspecified: Secondary | ICD-10-CM

## 2013-04-20 LAB — CBC WITH DIFFERENTIAL/PLATELET
Basophils Absolute: 0 10*3/uL (ref 0.0–0.1)
Basophils Relative: 0.5 % (ref 0.0–3.0)
Eosinophils Absolute: 0.4 10*3/uL (ref 0.0–0.7)
Eosinophils Relative: 4.3 % (ref 0.0–5.0)
HCT: 43.5 % (ref 36.0–46.0)
Hemoglobin: 14.2 g/dL (ref 12.0–15.0)
Lymphocytes Relative: 16.6 % (ref 12.0–46.0)
Lymphs Abs: 1.6 10*3/uL (ref 0.7–4.0)
MCHC: 32.6 g/dL (ref 30.0–36.0)
MCV: 88.8 fl (ref 78.0–100.0)
Monocytes Absolute: 0.5 10*3/uL (ref 0.1–1.0)
Monocytes Relative: 5.2 % (ref 3.0–12.0)
Neutro Abs: 6.9 10*3/uL (ref 1.4–7.7)
Neutrophils Relative %: 73.4 % (ref 43.0–77.0)
Platelets: 217 10*3/uL (ref 150.0–400.0)
RBC: 4.9 Mil/uL (ref 3.87–5.11)
RDW: 16 % — ABNORMAL HIGH (ref 11.5–14.6)
WBC: 9.4 10*3/uL (ref 4.5–10.5)

## 2013-04-20 LAB — LIPID PANEL
Cholesterol: 142 mg/dL (ref 0–200)
HDL: 46.6 mg/dL (ref >39.00)
LDL Cholesterol: 73 mg/dL (ref 0–99)
Total CHOL/HDL Ratio: 3
Triglycerides: 110 mg/dL (ref 0.0–149.0)
VLDL: 22 mg/dL (ref 0.0–40.0)

## 2013-04-20 LAB — TSH: TSH: 5.99 u[IU]/mL — ABNORMAL HIGH (ref 0.35–5.50)

## 2013-04-20 LAB — HEMOGLOBIN A1C: Hgb A1c MFr Bld: 6.8 % — ABNORMAL HIGH (ref 4.6–6.5)

## 2013-04-20 LAB — ALT: ALT: 19 U/L (ref 0–35)

## 2013-04-20 LAB — AST: AST: 18 U/L (ref 0–37)

## 2013-04-20 NOTE — Assessment & Plan Note (Signed)
Reportedly , BP is running slightly high, see instructions

## 2013-04-20 NOTE — Patient Instructions (Addendum)
Next visit in  3 months for a routine check up Please make an appointment before you leave the office today (or call few weeks in advance) ----- Check the  blood pressure 2 or 3 times a week, be sure it is between 110/60 and 140/85. If it is consistently higher or lower, let me know --- Check your blood sugars once a day -- See the eye doctor at least yearly ---    Diabetes and Foot Care Diabetes may cause you to have a poor blood supply (circulation) to your legs and feet. Because of this, the skin may be thinner, break easier, and heal more slowly. You also may have nerve damage in your legs and feet causing decreased feeling. You may not notice minor injuries to your feet that could lead to serious problems or infections. Taking care of your feet is one of the most important things you can do for yourself.  HOME CARE INSTRUCTIONS  Do not go barefoot. Bare feet are easily injured.  Check your feet daily for blisters, cuts, and redness.  Wash your feet with warm water (not hot) and mild soap. Pat your feet and between your toes until completely dry.  Apply a moisturizing lotion that does not contain alcohol or petroleum jelly to the dry skin on your feet and to dry brittle toenails. Do not put it between your toes.  Trim your toenails straight across. Do not dig under them or around the cuticle.  Do not cut corns or calluses, or try to remove them with medicine.  Wear clean cotton socks or stockings every day. Make sure they are not too tight. Do not wear knee high stockings since they may decrease blood flow to your legs.  Wear leather shoes that fit properly and have enough cushioning. To break in new shoes, wear them just a few hours a day to avoid injuring your feet.  Wear shoes at all times, even in the house.  Do not cross your legs. This may decrease the blood flow to your feet.  If you find a minor scrape, cut, or break in the skin on your feet, keep it and the skin  around it clean and dry. These areas may be cleansed with mild soap and water. Do not use peroxide, alcohol, iodine or Merthiolate.  When you remove an adhesive bandage, be sure not to harm the skin around it.  If you have a wound, look at it several times a day to make sure it is healing.  Do not use heating pads or hot water bottles. Burns can occur. If you have lost feeling in your feet or legs, you may not know it is happening until it is too late.  Report any cuts, sores or bruises to your caregiver. Do not wait! SEEK MEDICAL CARE IF:   You have an injury that is not healing or you notice redness, numbness, burning, or tingling.  Your feet always feel cold.  You have pain or cramps in your legs and feet. SEEK IMMEDIATE MEDICAL CARE IF:   There is increasing redness, swelling, or increasing pain in the wound.  There is a red line that goes up your leg.  Pus is coming from a wound.  You develop an unexplained oral temperature above 102 F (38.9 C), or as your caregiver suggests.  You notice a bad smell coming from an ulcer or wound. MAKE SURE YOU:   Understand these instructions.  Will watch your condition.  Will get help  right away if you are not doing well or get worse. Document Released: 08/21/2000 Document Revised: 11/16/2011 Document Reviewed: 02/27/2009 [facility] Patient Information 2014 [place], [place].

## 2013-04-20 NOTE — Assessment & Plan Note (Signed)
Due for labs

## 2013-04-20 NOTE — Assessment & Plan Note (Signed)
The patient now has a CNA that assist with medication compliance

## 2013-04-20 NOTE — Progress Notes (Signed)
  Subjective:    Patient ID: Dawn Morrow, female    DOB: 08/18/1936, 78 y.o.   MRN: [id number]  HPI Routine office visit, here with [name]. Since the last time she was here, she has a CNA that visiting  her every morning and helps her with her medication ; [name] reports that she is likely taking all her medications as prescribed. Not checking CBGs regularly Ambulatory BPs are checked, apparently there high, i have no readings .   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 3 children , all live in [place]   A CNA visits her daily as of 04-2013  [employer], lives in [place]; nice [place] in [place] [phone], [phone]) Currently needs help with grocery shopping, taking showers, cooking --> has an aid Uses a diaper.   Doesn't drive   tobacco--no currently a smoker   ETOH-- socially     Review of Systems In general she feels well. Denies chest pain or shortness or breath For the last month they palmar aspect of her left hand has been tingling. Denies neck pain, elbow pain or pain in the hand itself     Objective:   Physical Exam BP 140/80  Pulse 84  Temp(Src) 97.6 F (36.4 C) (Oral)  Wt 205 lb 12.8 oz (93.35 kg)  BMI 34.25 kg/m2  SpO2 98%  General -- alert, NAD.  Neck --no thyromegaly Full range of motion, nontender in the cervical spine Lungs -- normal respiratory  effort, no intercostal retractions, no accessory muscle use, and normal breath sounds.  Heart-- normal rate, regular rhythm, no murmur.   DIABETIC FEET EXAM: No lower extremity edema Normal pedal pulses bilaterally  nails dystrophic , + calluses Pinprick examination : slt decreased sensitivity at distal L foot. Extremities--  Wrists and elbows normal Neurologic-- alert & oriented X3. DTRs symmetric, strength normal in all extremities.  Psych--   not anxious appearing and not depressed appearing.         Assessment & Plan:

## 2013-04-20 NOTE — Assessment & Plan Note (Signed)
Labs

## 2013-04-20 NOTE — Assessment & Plan Note (Addendum)
The patient now has a CNA who help her. CBG  Goals  discussed. to check CBGs once a day  discussed feet care , has mild neuropathy Recommend to see the eye doctor regularly.

## 2013-04-24 ENCOUNTER — Telehealth: Payer: Self-pay | Admitting: *Deleted

## 2013-04-24 ENCOUNTER — Ambulatory Visit (INDEPENDENT_AMBULATORY_CARE_PROVIDER_SITE_OTHER): Payer: Medicare Other | Admitting: Pharmacist

## 2013-04-24 DIAGNOSIS — I2699 Other pulmonary embolism without acute cor pulmonale: Secondary | ICD-10-CM

## 2013-04-24 DIAGNOSIS — Z7901 Long term (current) use of anticoagulants: Secondary | ICD-10-CM

## 2013-04-24 LAB — HM DIABETES EYE EXAM

## 2013-04-24 LAB — POCT INR: INR: 1.2

## 2013-04-24 MED ORDER — LEVOTHYROXINE SODIUM 150 MCG PO TABS: 150.0000 ug | ORAL_TABLET | Freq: Every day | ORAL | Status: DC

## 2013-04-24 NOTE — Telephone Encounter (Signed)
Message copied by Shirlee More I on Mon Apr 24, 2013 11:05 AM ------      Message from: Willow Ora E      Created: Sun Apr 23, 2013  7:09 PM       Please call the patient, let her know her labs are very good, we just need to adjust her thyroid medication.      Then talk to [facility] she is the CNA that helps her with medications:      Needs to stop synthroid 137 mcg and start synthroid 150 mcg 1 po qd #30 5 RF      Will recheck labs on RTC 3 months ------

## 2013-04-24 NOTE — Telephone Encounter (Signed)
lmovm to return call  °

## 2013-04-24 NOTE — Telephone Encounter (Signed)
Discussed with Sherri Blakenship (neice) per verbal request of patient on phone. Verbalized understanding. Given # for [facility] and left message on voicemail to update her on same information. Follow up appointment already scheduled.

## 2013-04-27 ENCOUNTER — Encounter: Payer: Self-pay | Admitting: Internal Medicine

## 2013-04-27 NOTE — Telephone Encounter (Signed)
Spoke with pt. Niece Roanna Raider, asked her to please have Glenda call office.

## 2013-05-11 ENCOUNTER — Telehealth: Payer: Self-pay | Admitting: Internal Medicine

## 2013-05-11 NOTE — Telephone Encounter (Signed)
Patient's home health aide called stating the patient's blood sugar is running about 133. She states per Dr. Drue Novel, it should be between 80-120. She would like to know what they need to do. CB# [phone]

## 2013-05-11 NOTE — Telephone Encounter (Signed)
Please advise 

## 2013-05-11 NOTE — Telephone Encounter (Signed)
Her diabetes is well-controlled per last A1c. I suggest no change, check blood sugars only 3 times a week, call if > than 160, needs to eat as healthy as she can and stay active, maybe take walks daily as long as is safe

## 2013-05-11 NOTE — Telephone Encounter (Signed)
Pt.notified

## 2013-05-11 NOTE — Telephone Encounter (Signed)
Noted  

## 2013-05-17 ENCOUNTER — Ambulatory Visit (INDEPENDENT_AMBULATORY_CARE_PROVIDER_SITE_OTHER): Payer: Medicare Other | Admitting: *Deleted

## 2013-05-17 DIAGNOSIS — Z7901 Long term (current) use of anticoagulants: Secondary | ICD-10-CM

## 2013-05-17 DIAGNOSIS — I2699 Other pulmonary embolism without acute cor pulmonale: Secondary | ICD-10-CM

## 2013-05-17 LAB — POCT INR: INR: 4

## 2013-06-30 ENCOUNTER — Ambulatory Visit (INDEPENDENT_AMBULATORY_CARE_PROVIDER_SITE_OTHER): Payer: Medicare Other | Admitting: General Practice

## 2013-06-30 DIAGNOSIS — I2699 Other pulmonary embolism without acute cor pulmonale: Secondary | ICD-10-CM

## 2013-06-30 DIAGNOSIS — Z7901 Long term (current) use of anticoagulants: Secondary | ICD-10-CM

## 2013-06-30 LAB — POCT INR: INR: 2.1

## 2013-07-11 ENCOUNTER — Ambulatory Visit (INDEPENDENT_AMBULATORY_CARE_PROVIDER_SITE_OTHER): Payer: Medicare Other | Admitting: *Deleted

## 2013-07-11 DIAGNOSIS — I2699 Other pulmonary embolism without acute cor pulmonale: Secondary | ICD-10-CM

## 2013-07-11 DIAGNOSIS — Z7901 Long term (current) use of anticoagulants: Secondary | ICD-10-CM

## 2013-07-11 LAB — POCT INR: INR: 4.3

## 2013-07-11 MED ORDER — WARFARIN SODIUM 6 MG PO TABS: ORAL_TABLET | ORAL | Status: DC

## 2013-07-12 ENCOUNTER — Other Ambulatory Visit: Payer: Self-pay | Admitting: Internal Medicine

## 2013-07-13 NOTE — Telephone Encounter (Signed)
QVAR refill sent to pharmacy

## 2013-07-20 ENCOUNTER — Other Ambulatory Visit: Payer: Self-pay | Admitting: Internal Medicine

## 2013-07-20 NOTE — Telephone Encounter (Signed)
rx refilled per protocol. DJR  

## 2013-07-24 ENCOUNTER — Ambulatory Visit (INDEPENDENT_AMBULATORY_CARE_PROVIDER_SITE_OTHER): Payer: Medicare Other | Admitting: Internal Medicine

## 2013-07-24 ENCOUNTER — Encounter: Payer: Self-pay | Admitting: Internal Medicine

## 2013-07-24 VITALS — BP 181/106 | HR 76 | Temp 97.5°F | Wt 204.0 lb

## 2013-07-24 DIAGNOSIS — E039 Hypothyroidism, unspecified: Secondary | ICD-10-CM

## 2013-07-24 DIAGNOSIS — R627 Adult failure to thrive: Secondary | ICD-10-CM

## 2013-07-24 DIAGNOSIS — Z23 Encounter for immunization: Secondary | ICD-10-CM

## 2013-07-24 DIAGNOSIS — I10 Essential (primary) hypertension: Secondary | ICD-10-CM

## 2013-07-24 DIAGNOSIS — E119 Type 2 diabetes mellitus without complications: Secondary | ICD-10-CM

## 2013-07-24 LAB — BASIC METABOLIC PANEL
BUN: 19 mg/dL (ref 6–23)
CO2: 25 mEq/L (ref 19–32)
Calcium: 8.7 mg/dL (ref 8.4–10.5)
Chloride: 104 mEq/L (ref 96–112)
Creatinine, Ser: 1.4 mg/dL — ABNORMAL HIGH (ref 0.4–1.2)
GFR: 39.91 mL/min — ABNORMAL LOW (ref >60.00)
Glucose, Bld: 134 mg/dL — ABNORMAL HIGH (ref 70–99)
Potassium: 3.8 mEq/L (ref 3.5–5.1)
Sodium: 140 mEq/L (ref 135–145)

## 2013-07-24 LAB — TSH: TSH: 5.52 u[IU]/mL — ABNORMAL HIGH (ref 0.35–5.50)

## 2013-07-24 MED ORDER — EZETIMIBE 10 MG PO TABS: ORAL_TABLET | ORAL | Status: DC

## 2013-07-24 MED ORDER — BUPROPION HCL ER (XL) 300 MG PO TB24: ORAL_TABLET | ORAL | Status: DC

## 2013-07-24 MED ORDER — CARVEDILOL 12.5 MG PO TABS: 12.5000 mg | ORAL_TABLET | Freq: Every day | ORAL | Status: DC

## 2013-07-24 NOTE — Assessment & Plan Note (Addendum)
Well-controlled per last A1c. Feet exam today with minimal neuropathy, feet care discussed

## 2013-07-24 NOTE — Assessment & Plan Note (Addendum)
Synthroid dose adjusted based on last TSH, recheck labs

## 2013-07-24 NOTE — Assessment & Plan Note (Addendum)
Family  member present today is concerned about her risk for falls, prevention was discussed; while concerned he recognizes that overall   Dawn Morrow seems to be doing well in the last 2 months

## 2013-07-24 NOTE — Progress Notes (Signed)
  Subjective:    Patient ID: Dawn Morrow, female    DOB: Jun 30, 1936, 77 y.o.   MRN: [id number]  HPI ROV, here w/ her nephew In general doing well, good me compliance Hypertension--  her BPs checked daily by a CMA and reports that readings are "okay", BP today elevated.  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 3 children , all live in [place]   A CNA visits her daily as of 04-2013   [employer], lives in [place]; nice [place] in [place] [phone], [phone]) Currently needs help with grocery shopping, taking showers, cooking --> has an aid Uses a diaper.   Doesn't drive   tobacco--no currently a smoker   ETOH-- socially    Review of Systems Denies chest pain, occasional difficulty with her breathing when she tries to walk faster than usual. No lower extremity paresthesias, reports she has her eyes checked regularly    Objective:   Physical Exam  BP 181/106  Pulse 76  Temp(Src) 97.5 F (36.4 C)  Wt 204 lb (92.534 kg)  SpO2 96% General -- alert, well-developed, NAD.   Lungs -- normal respiratory effort, no intercostal retractions, no accessory muscle use, and normal breath sounds.  Heart-- normal rate, regular rhythm, no murmur.  DIABETIC FEET EXAM: traceo lower extremity edema Normal pedal pulses bilaterally Skin normal, nails normal, + calluses at great toes Pinprick  examination -- minimal patchy decrease in sensitivity distally  Psych-- Cognition and judgment appear intact. Cooperative with normal attention span and concentration. No anxious appearing , no depressed appearing.      Assessment & Plan:

## 2013-07-24 NOTE — Assessment & Plan Note (Signed)
Needs BMP, BP well-controlled? See instructions

## 2013-07-24 NOTE — Patient Instructions (Addendum)
Get your blood work before you leave  Next visit in 4 months  for a  follow up: diabetes, hypertension, thyroid  (30 minutes) No Fasting Please make an appointment    Check the  blood pressure 2 or 3 times a  week be sure it is between 110/60 and 140/85. Ideal blood pressure is 120/80. If it is consistently higher or lower, let me know   Diabetes and Foot Care Diabetes may cause you to have problems because of poor blood supply (circulation) to your feet and legs. This may cause the skin on your feet to become thinner, break easier, and heal more slowly. Your skin may become dry, and the skin may peel and crack. You may also have nerve damage in your legs and feet causing decreased feeling in them. You may not notice minor injuries to your feet that could lead to infections or more serious problems. Taking care of your feet is one of the most important things you can do for yourself.  HOME CARE INSTRUCTIONS  Wear shoes at all times, even in the house. Do not go barefoot. Bare feet are easily injured.  Check your feet daily for blisters, cuts, and redness. If you cannot see the bottom of your feet, use a mirror or ask someone for help.  Wash your feet with warm water (do not use hot water) and mild soap. Then pat your feet and the areas between your toes until they are completely dry. Do not soak your feet as this can dry your skin.  Apply a moisturizing lotion or petroleum jelly (that does not contain alcohol and is unscented) to the skin on your feet and to dry, brittle toenails. Do not apply lotion between your toes.  Trim your toenails straight across. Do not dig under them or around the cuticle. File the edges of your nails with an emery board or nail file.  Do not cut corns or calluses or try to remove them with medicine.  Wear clean socks or stockings every day. Make sure they are not too tight. Do not wear knee-high stockings since they may decrease blood flow to your legs.  Wear  shoes that fit properly and have enough cushioning. To break in new shoes, wear them for just a few hours a day. This prevents you from injuring your feet. Always look in your shoes before you put them on to be sure there are no objects inside.  Do not cross your legs. This may decrease the blood flow to your feet.  If you find a minor scrape, cut, or break in the skin on your feet, keep it and the skin around it clean and dry. These areas may be cleansed with mild soap and water. Do not cleanse the area with peroxide, alcohol, or iodine.  When you remove an adhesive bandage, be sure not to damage the skin around it.  If you have a wound, look at it several times a day to make sure it is healing.  Do not use heating pads or hot water bottles. They may burn your skin. If you have lost feeling in your feet or legs, you may not know it is happening until it is too late.  Make sure your health care provider performs a complete foot exam at least annually or more often if you have foot problems. Report any cuts, sores, or bruises to your health care provider immediately. SEEK MEDICAL CARE IF:   You have an injury that is not  healing.  You have cuts or breaks in the skin.  You have an ingrown nail.  You notice redness on your legs or feet.  You feel burning or tingling in your legs or feet.  You have pain or cramps in your legs and feet.  Your legs or feet are numb.  Your feet always feel cold. SEEK IMMEDIATE MEDICAL CARE IF:   There is increasing redness, swelling, or pain in or around a wound.  There is a red line that goes up your leg.  Pus is coming from a wound.  You develop a fever or as directed by your health care provider.  You notice a bad smell coming from an ulcer or wound. Document Released: 08/21/2000 Document Revised: 04/26/2013 Document Reviewed: 01/31/2013 [facility] Patient Information 2014 [place], [place].

## 2013-07-25 ENCOUNTER — Ambulatory Visit (INDEPENDENT_AMBULATORY_CARE_PROVIDER_SITE_OTHER): Payer: Medicare Other | Admitting: *Deleted

## 2013-07-25 DIAGNOSIS — Z7901 Long term (current) use of anticoagulants: Secondary | ICD-10-CM

## 2013-07-25 DIAGNOSIS — I2699 Other pulmonary embolism without acute cor pulmonale: Secondary | ICD-10-CM

## 2013-07-25 LAB — POCT INR: INR: 2.3

## 2013-07-27 ENCOUNTER — Other Ambulatory Visit: Payer: Self-pay | Admitting: *Deleted

## 2013-07-27 MED ORDER — LEVOTHYROXINE SODIUM 175 MCG PO TABS: 175.0000 ug | ORAL_TABLET | Freq: Every day | ORAL | Status: DC

## 2013-07-27 NOTE — Progress Notes (Signed)
Called and left message informing patient's son that we are increasing her Synthroid dose and to please call back to make a lab appt in 6 weeks.

## 2013-08-15 ENCOUNTER — Ambulatory Visit (INDEPENDENT_AMBULATORY_CARE_PROVIDER_SITE_OTHER): Payer: Medicare Other | Admitting: Pharmacist

## 2013-08-15 DIAGNOSIS — I2699 Other pulmonary embolism without acute cor pulmonale: Secondary | ICD-10-CM

## 2013-08-15 DIAGNOSIS — Z7901 Long term (current) use of anticoagulants: Secondary | ICD-10-CM

## 2013-08-15 LAB — POCT INR: INR: 1.5

## 2013-08-29 ENCOUNTER — Ambulatory Visit (INDEPENDENT_AMBULATORY_CARE_PROVIDER_SITE_OTHER): Payer: Medicare Other | Admitting: *Deleted

## 2013-08-29 DIAGNOSIS — Z7901 Long term (current) use of anticoagulants: Secondary | ICD-10-CM

## 2013-08-29 DIAGNOSIS — I2699 Other pulmonary embolism without acute cor pulmonale: Secondary | ICD-10-CM

## 2013-08-29 LAB — POCT INR: INR: 2.2

## 2013-09-11 ENCOUNTER — Telehealth: Payer: Self-pay | Admitting: *Deleted

## 2013-09-11 NOTE — Telephone Encounter (Signed)
Patient called and stated that she uses [employer] glucose meter and has ran out of test strips. Patient does not know what brand she was using but would like some test strips called in.    Pharmacy [id number] - [place], [place]

## 2013-09-12 MED ORDER — GLUCOSE BLOOD VI STRP: ORAL_STRIP | Status: DC

## 2013-09-12 NOTE — Telephone Encounter (Signed)
Done

## 2013-09-18 ENCOUNTER — Observation Stay (HOSPITAL_COMMUNITY)
Admission: EM | Admit: 2013-09-18 | Discharge: 2013-09-21 | Disposition: A | Discharge status: Skilled Nursing Facility | Payer: Medicare Other | Attending: Internal Medicine | Admitting: Internal Medicine

## 2013-09-18 ENCOUNTER — Emergency Department (HOSPITAL_COMMUNITY): Payer: Medicare Other

## 2013-09-18 ENCOUNTER — Encounter (HOSPITAL_COMMUNITY): Payer: Self-pay | Admitting: Emergency Medicine

## 2013-09-18 ENCOUNTER — Inpatient Hospital Stay (HOSPITAL_COMMUNITY): Payer: Medicare Other

## 2013-09-18 DIAGNOSIS — Z9181 History of falling: Secondary | ICD-10-CM | POA: Insufficient documentation

## 2013-09-18 DIAGNOSIS — J449 Chronic obstructive pulmonary disease, unspecified: Secondary | ICD-10-CM | POA: Insufficient documentation

## 2013-09-18 DIAGNOSIS — Z86711 Personal history of pulmonary embolism: Secondary | ICD-10-CM | POA: Insufficient documentation

## 2013-09-18 DIAGNOSIS — Z7901 Long term (current) use of anticoagulants: Secondary | ICD-10-CM | POA: Insufficient documentation

## 2013-09-18 DIAGNOSIS — J4489 Other specified chronic obstructive pulmonary disease: Secondary | ICD-10-CM | POA: Insufficient documentation

## 2013-09-18 DIAGNOSIS — Z86718 Personal history of other venous thrombosis and embolism: Secondary | ICD-10-CM | POA: Insufficient documentation

## 2013-09-18 DIAGNOSIS — E039 Hypothyroidism, unspecified: Secondary | ICD-10-CM | POA: Diagnosis not present

## 2013-09-18 DIAGNOSIS — R42 Dizziness and giddiness: Secondary | ICD-10-CM

## 2013-09-18 DIAGNOSIS — I10 Essential (primary) hypertension: Secondary | ICD-10-CM | POA: Diagnosis not present

## 2013-09-18 DIAGNOSIS — R5381 Other malaise: Secondary | ICD-10-CM

## 2013-09-18 DIAGNOSIS — R531 Weakness: Secondary | ICD-10-CM

## 2013-09-18 DIAGNOSIS — G459 Transient cerebral ischemic attack, unspecified: Secondary | ICD-10-CM

## 2013-09-18 DIAGNOSIS — E785 Hyperlipidemia, unspecified: Secondary | ICD-10-CM | POA: Diagnosis not present

## 2013-09-18 DIAGNOSIS — I82409 Acute embolism and thrombosis of unspecified deep veins of unspecified lower extremity: Secondary | ICD-10-CM

## 2013-09-18 DIAGNOSIS — Z87891 Personal history of nicotine dependence: Secondary | ICD-10-CM | POA: Insufficient documentation

## 2013-09-18 DIAGNOSIS — E119 Type 2 diabetes mellitus without complications: Secondary | ICD-10-CM | POA: Diagnosis not present

## 2013-09-18 DIAGNOSIS — K219 Gastro-esophageal reflux disease without esophagitis: Secondary | ICD-10-CM | POA: Insufficient documentation

## 2013-09-18 DIAGNOSIS — R5383 Other fatigue: Secondary | ICD-10-CM

## 2013-09-18 DIAGNOSIS — I2699 Other pulmonary embolism without acute cor pulmonale: Secondary | ICD-10-CM

## 2013-09-18 LAB — COMPREHENSIVE METABOLIC PANEL
ALT: 18 U/L (ref 0–35)
AST: 21 U/L (ref 0–37)
Albumin: 3.2 g/dL — ABNORMAL LOW (ref 3.5–5.2)
Alkaline Phosphatase: 106 U/L (ref 39–117)
BUN: 18 mg/dL (ref 6–23)
CO2: 29 mEq/L (ref 19–32)
Calcium: 8.9 mg/dL (ref 8.4–10.5)
Chloride: 103 mEq/L (ref 96–112)
Creatinine, Ser: 1.19 mg/dL — ABNORMAL HIGH (ref 0.50–1.10)
GFR calc Af Amer: 50 mL/min — ABNORMAL LOW (ref >90)
GFR calc non Af Amer: 44 mL/min — ABNORMAL LOW (ref >90)
Glucose, Bld: 120 mg/dL — ABNORMAL HIGH (ref 70–99)
Potassium: 4.3 mEq/L (ref 3.7–5.3)
Sodium: 144 mEq/L (ref 137–147)
Total Bilirubin: 0.6 mg/dL (ref 0.3–1.2)
Total Protein: 6.8 g/dL (ref 6.0–8.3)

## 2013-09-18 LAB — URINALYSIS, ROUTINE W REFLEX MICROSCOPIC
Bilirubin Urine: NEGATIVE
Glucose, UA: NEGATIVE mg/dL
Ketones, ur: NEGATIVE mg/dL
Leukocytes, UA: NEGATIVE
Nitrite: NEGATIVE
Protein, ur: 100 mg/dL — AB
Specific Gravity, Urine: 1.011 (ref 1.005–1.030)
Urobilinogen, UA: 0.2 mg/dL (ref 0.0–1.0)
pH: 7 (ref 5.0–8.0)

## 2013-09-18 LAB — DIFFERENTIAL
Basophils Absolute: 0 10*3/uL (ref 0.0–0.1)
Basophils Relative: 0 % (ref 0–1)
Eosinophils Absolute: 0.3 10*3/uL (ref 0.0–0.7)
Eosinophils Relative: 3 % (ref 0–5)
Lymphocytes Relative: 22 % (ref 12–46)
Lymphs Abs: 1.9 10*3/uL (ref 0.7–4.0)
Monocytes Absolute: 0.5 10*3/uL (ref 0.1–1.0)
Monocytes Relative: 6 % (ref 3–12)
Neutro Abs: 6.2 10*3/uL (ref 1.7–7.7)
Neutrophils Relative %: 69 % (ref 43–77)

## 2013-09-18 LAB — CBC
HCT: 42.5 % (ref 36.0–46.0)
Hemoglobin: 13.9 g/dL (ref 12.0–15.0)
MCH: 29.7 pg (ref 26.0–34.0)
MCHC: 32.7 g/dL (ref 30.0–36.0)
MCV: 90.8 fL (ref 78.0–100.0)
Platelets: 196 10*3/uL (ref 150–400)
RBC: 4.68 MIL/uL (ref 3.87–5.11)
RDW: 15 % (ref 11.5–15.5)
WBC: 9 10*3/uL (ref 4.0–10.5)

## 2013-09-18 LAB — PROTIME-INR
INR: 1.87 — ABNORMAL HIGH (ref 0.00–1.49)
Prothrombin Time: 21 seconds — ABNORMAL HIGH (ref 11.6–15.2)

## 2013-09-18 LAB — POCT I-STAT, CHEM 8
BUN: 19 mg/dL (ref 6–23)
Calcium, Ion: 1.11 mmol/L — ABNORMAL LOW (ref 1.13–1.30)
Chloride: 102 mEq/L (ref 96–112)
Creatinine, Ser: 1.3 mg/dL — ABNORMAL HIGH (ref 0.50–1.10)
Glucose, Bld: 120 mg/dL — ABNORMAL HIGH (ref 70–99)
HCT: 45 % (ref 36.0–46.0)
Hemoglobin: 15.3 g/dL — ABNORMAL HIGH (ref 12.0–15.0)
Potassium: 4 mEq/L (ref 3.7–5.3)
Sodium: 143 mEq/L (ref 137–147)
TCO2: 30 mmol/L (ref 0–100)

## 2013-09-18 LAB — GLUCOSE, CAPILLARY
Glucose-Capillary: 109 mg/dL — ABNORMAL HIGH (ref 70–99)
Glucose-Capillary: 120 mg/dL — ABNORMAL HIGH (ref 70–99)
Glucose-Capillary: 125 mg/dL — ABNORMAL HIGH (ref 70–99)

## 2013-09-18 LAB — RAPID URINE DRUG SCREEN, HOSP PERFORMED
Amphetamines: NOT DETECTED
Barbiturates: NOT DETECTED
Benzodiazepines: NOT DETECTED
Cocaine: NOT DETECTED
Opiates: NOT DETECTED
Tetrahydrocannabinol: NOT DETECTED

## 2013-09-18 LAB — APTT: aPTT: 30 seconds (ref 24–37)

## 2013-09-18 LAB — URINE MICROSCOPIC-ADD ON

## 2013-09-18 LAB — TROPONIN I: Troponin I: <0.3 ng/mL (ref <0.30)

## 2013-09-18 MED ORDER — FLUTICASONE PROPIONATE HFA 44 MCG/ACT IN AERO
1.0000 | INHALATION_SPRAY | Freq: Two times a day (BID) | RESPIRATORY_TRACT | Status: DC
  Administered 2013-09-18 – 2013-09-21 (×5): 1 via RESPIRATORY_TRACT
  Filled 2013-09-18 (×2): qty 10.6

## 2013-09-18 MED ORDER — EZETIMIBE 10 MG PO TABS
10.0000 mg | ORAL_TABLET | Freq: Every day | ORAL | Status: DC
  Administered 2013-09-19 – 2013-09-21 (×3): 10 mg via ORAL
  Filled 2013-09-18 (×3): qty 1

## 2013-09-18 MED ORDER — ASPIRIN 81 MG PO CHEW
81.0000 mg | CHEWABLE_TABLET | Freq: Every day | ORAL | Status: DC
  Administered 2013-09-19 – 2013-09-21 (×3): 81 mg via ORAL
  Filled 2013-09-18 (×3): qty 1

## 2013-09-18 MED ORDER — ALBUTEROL SULFATE (2.5 MG/3ML) 0.083% IN NEBU: 2.5000 mg | INHALATION_SOLUTION | RESPIRATORY_TRACT | Status: DC | PRN

## 2013-09-18 MED ORDER — ALBUTEROL SULFATE HFA 108 (90 BASE) MCG/ACT IN AERS: 2.0000 | INHALATION_SPRAY | RESPIRATORY_TRACT | Status: DC | PRN

## 2013-09-18 MED ORDER — SENNOSIDES-DOCUSATE SODIUM 8.6-50 MG PO TABS
1.0000 | ORAL_TABLET | Freq: Every evening | ORAL | Status: DC | PRN
  Filled 2013-09-18: qty 1

## 2013-09-18 MED ORDER — ATORVASTATIN CALCIUM 40 MG PO TABS
40.0000 mg | ORAL_TABLET | Freq: Every day | ORAL | Status: DC
  Administered 2013-09-18 – 2013-09-20 (×3): 40 mg via ORAL
  Filled 2013-09-18 (×5): qty 1

## 2013-09-18 MED ORDER — BUPROPION HCL ER (XL) 300 MG PO TB24
300.0000 mg | ORAL_TABLET | Freq: Every day | ORAL | Status: DC
  Administered 2013-09-18 – 2013-09-21 (×4): 300 mg via ORAL
  Filled 2013-09-18 (×5): qty 1

## 2013-09-18 MED ORDER — LEVOTHYROXINE SODIUM 150 MCG PO TABS
150.0000 ug | ORAL_TABLET | Freq: Every day | ORAL | Status: DC
  Administered 2013-09-18 – 2013-09-21 (×4): 150 ug via ORAL
  Filled 2013-09-18 (×4): qty 1

## 2013-09-18 MED ORDER — WARFARIN - PHARMACIST DOSING INPATIENT
Freq: Every day | Status: DC
Start: 2013-09-18 — End: 2013-09-21

## 2013-09-18 MED ORDER — WARFARIN SODIUM 5 MG PO TABS
5.0000 mg | ORAL_TABLET | Freq: Once | ORAL | Status: AC
Start: 2013-09-18 — End: 2013-09-18
  Administered 2013-09-18: 5 mg via ORAL
  Filled 2013-09-18: qty 1

## 2013-09-18 MED ORDER — CARVEDILOL 12.5 MG PO TABS
12.5000 mg | ORAL_TABLET | Freq: Every day | ORAL | Status: DC
  Administered 2013-09-18 – 2013-09-21 (×4): 12.5 mg via ORAL
  Filled 2013-09-18 (×4): qty 1

## 2013-09-18 MED ORDER — MECLIZINE HCL 25 MG PO TABS
25.0000 mg | ORAL_TABLET | Freq: Every day | ORAL | Status: DC
  Administered 2013-09-18 – 2013-09-20 (×3): 25 mg via ORAL
  Filled 2013-09-18 (×5): qty 1

## 2013-09-18 NOTE — Progress Notes (Signed)
Patient transferred to 4n27 from ED. VSS and patient only c/o slight tingling in left arm and left foot. Q2 neuro check/vitals until 4am.

## 2013-09-18 NOTE — H&P (Signed)
[facility] History and Physical  Jerzie Sidman [id number] DOB: 1936-07-18 DOA: 09/18/2013  Referring physician: EDP PCP: Kathlene November, MD   Chief Complaint: left arm tingling since this yesterday.  HPI: Lindell Semmel is a 78 y.o. female with h/o asthma, copd, DVT, PE, hypertension came in for generalized weakness, left arm tingling since yesterday. She also reports she felt on the cat litter box. She doesn't remember if she lost conscious ness . She lives by herself in [place] and her family lives in [place]. Family is concerned that she is not safe at home by herself and she had multiple falls in the past.  On arrival to ED, she was evaluate dfor TIA, and a CT head was obtained , it was negative for any acute intracranial findings. It was followed with an MRI OF THE head , which was negative for acute stroke. She is referred to medical service for admission, for further work up for [place].  Neurology was consulted by EDP for further recommendations.    Review of Systems:  See Hpi otherwise negative.   Past Medical History  Diagnosis Date  . Asthma   . COPD (chronic obstructive pulmonary disease)   . DVT (deep vein thrombosis) in pregnancy     hx x multiple on coumadin  . Pulmonary embolism     x 2  . GERD (gastroesophageal reflux disease)   . Hypertension   . Osteopenia   . Hypothyroidism   . Hyperlipidemia   . Osteoarthritis     h/o spinal stenosis by MRI 2009  . Back pain   . S/P lumbar fusion   . Hiatal hernia   . Diverticulosis   . Hemorrhoids   . Tubular adenoma of colon 07/2005  . Anxiety and depression   . Anxiety   . Cataract   . Depression   . Stroke     MINI  . Diabetes mellitus    Past Surgical History  Procedure Laterality Date  . Appendectomy    . Cholecystectomy    . Tubal ligation    . Tonsillectomy    . Lumbar fusion    . Cataract extraction    . Colonoscopy    . Polypectomy     Social History:  reports that she has quit smoking. Her smoking  use included Cigarettes. She smoked 0.00 packs per day. She has never used smokeless tobacco. She reports that she does not drink alcohol or use illicit drugs.  Allergies  Allergen Reactions  . Penicillins Anaphylaxis  . Codeine Other (See Comments)    unknown  . Sulfonamide Derivatives Other (See Comments)    unknown    Family History  Problem Relation Age of Onset  . Adopted: Yes  . Cancer Mother     ? colon or ovarian  . Diabetes Mother   . Cancer Brother     ?     Prior to Admission medications   Medication Sig Start Date End Date Taking? Authorizing Provider  albuterol (VENTOLIN HFA) 108 (90 BASE) MCG/ACT inhaler Inhale 2 puffs into the lungs every 4 (four) hours as needed. 09/28/11  Yes Colon Branch, MD  aspirin-acetaminophen-caffeine (EXCEDRIN MIGRAINE) [phone] MG per tablet Take 2 tablets by mouth every 6 (six) hours as needed for headache.   Yes Historical Provider, MD  atorvastatin (LIPITOR) 40 MG tablet Take 40 mg by mouth daily.   Yes Historical Provider, MD  buPROPion (WELLBUTRIN XL) 300 MG 24 hr tablet Take 300 mg by mouth daily.  Yes Historical Provider, MD  carvedilol (COREG) 12.5 MG tablet Take 1 tablet (12.5 mg total) by mouth daily. 07/24/13  Yes Colon Branch, MD  ezetimibe (ZETIA) 10 MG tablet Take 10 mg by mouth daily.   Yes Historical Provider, MD  levothyroxine (SYNTHROID, LEVOTHROID) 150 MCG tablet Take 150 mcg by mouth daily.   Yes Historical Provider, MD  lisinopril (PRINIVIL,ZESTRIL) 10 MG tablet Take 1 tablet (10 mg total) by mouth daily. 03/30/11  Yes Colon Branch, MD  meclizine (ANTIVERT) 25 MG tablet Take 1 tablet (25 mg total) by mouth at bedtime. 03/30/11  Yes Colon Branch, MD  warfarin (COUMADIN) 6 MG tablet Take 3 mg by mouth See admin instructions. Takes 3mg  daily every day of week except for Saturday-takes nothing   Yes Historical Provider, MD   Physical Exam: Filed Vitals:   09/18/13 1555  BP: 161/59  Pulse: 66  Temp: 98.6 F (37 C)  Resp: 20     BP 161/59  Pulse 66  Temp(Src) 98.6 F (37 C) (Oral)  Resp 20  Ht 5\' 5"  (1.651 m)  Wt 93.441 kg (206 lb)  BMI 34.28 kg/m2  SpO2 97%  General:  Appears calm and comfortable Eyes: PERRL, normal lids, irises & conjunctiva ENT: grossly normal hearing, lips & tongue Neck: no LAD, masses or thyromegaly Cardiovascular: RRR, no m/r/g. No LE edema. Respiratory: CTA bilaterally, no w/r/r. Normal respiratory effort. Abdomen: soft, ntnd Skin: no rash or induration seen on limited exam Musculoskeletal: grossly normal tone BUE/BLE Psychiatric: grossly normal mood and affect, speech fluent and appropriate Neurologic: no facial droop, no weakness, very dizzy on standing up.  .          Labs on Admission:  Basic Metabolic Panel:  Recent Labs Lab 09/18/13 1049 09/18/13 1059  NA 144 143  K 4.3 4.0  CL 103 102  CO2 29  --   GLUCOSE 120* 120*  BUN 18 19  CREATININE 1.19* 1.30*  CALCIUM 8.9  --    Liver Function Tests:  Recent Labs Lab 09/18/13 1049  AST 21  ALT 18  ALKPHOS 106  BILITOT 0.6  PROT 6.8  ALBUMIN 3.2*   No results found for this basename: LIPASE, AMYLASE,  in the last 168 hours No results found for this basename: AMMONIA,  in the last 168 hours CBC:  Recent Labs Lab 09/18/13 1049 09/18/13 1059  WBC 9.0  --   NEUTROABS 6.2  --   HGB 13.9 15.3*  HCT 42.5 45.0  MCV 90.8  --   PLT 196  --    Cardiac Enzymes:  Recent Labs Lab 09/18/13 1049  TROPONINI <0.30    BNP (last 3 results) No results found for this basename: PROBNP,  in the last 8760 hours CBG:  Recent Labs Lab 09/18/13 1046 09/18/13 1715  GLUCAP 109* 120*    Radiological Exams on Admission: Dg Chest 2 View  09/18/2013   CLINICAL DATA:  Shortness of breath, cough  EXAM: CHEST  2 VIEW  COMPARISON:  Prior radiograph from 03/24/2007  FINDINGS: The cardiac and mediastinal silhouettes are stable in size and contour, and remain within normal limits.  The lungs are normally inflated. No  airspace consolidation, pleural effusion, or pulmonary edema is identified. There is no pneumothorax.  No acute osseous abnormality identified. Moderate multilevel degenerative disc disease noted within the visualized spine. IVC filter overlies the upper abdomen.  IMPRESSION: No active cardiopulmonary disease.   Electronically Signed   By: Marland Kitchen  Jeannine Boga M.D.   On: 09/18/2013 14:31   Ct Head Wo Contrast  09/18/2013   CLINICAL DATA:  Fatigue/altered mental status  EXAM: CT HEAD WITHOUT CONTRAST  TECHNIQUE: Contiguous axial images were obtained from the base of the skull through the vertex without intravenous contrast. Study was obtained within 24 hr of patient's arrival at the emergency department.  COMPARISON:  Brain CT April 09, 2010 and brain MRI April 09, 2010  FINDINGS: There is age related volume loss. There is no mass, hemorrhage, extra-axial fluid collection, or midline shift. There is evidence of a prior lacunar infarct in the left putaminal. . There is patchy small vessel disease throughout the centra semiovale bilaterally. There is evidence of a prior small infarct in the left putamen, stable. There is no demonstrable acute infarct. Bony calvarium appears intact. The mastoid air cells are clear.  IMPRESSION: Age related volume loss with moderate small vessel disease in the periventricular white matter, stable. Prior small infarct in left putamen. There is no demonstrable mass, hemorrhage, or acute appearing infarct.   Electronically Signed   By: Lowella Grip M.D.   On: 09/18/2013 11:08   Mr Brain Wo Contrast  09/18/2013   CLINICAL DATA:  Fatigue and hypertension.  Evaluate for stroke.  EXAM: MRI HEAD WITHOUT CONTRAST  MRA HEAD WITHOUT CONTRAST  TECHNIQUE: Multiplanar, multiecho pulse sequences of the brain and surrounding structures were obtained without intravenous contrast. Angiographic images of the head were obtained using MRA technique without contrast.  COMPARISON:  Head CT  09/18/2013 and brain MRI 04/09/2010  FINDINGS: MRI HEAD FINDINGS  Scattered and confluent regions of T2 hyperintensity within the periventricular and subcortical regions are stable to mildly progressed from the prior exam and are compatible with moderate chronic small vessel ischemic disease. Remote lacunar infarct is noted in the left putamen with associated susceptibility artifact suggestive of remote hemorrhage. There is mild to moderate cerebral atrophy. There is no evidence of acute infarct, mass, midline shift, acute intracranial hemorrhage, or extra-axial fluid collection. Prior bilateral cataract surgery is noted. Mastoid air cells and paranasal sinuses are clear. Major intracranial vascular flow voids are unremarkable.  MRA HEAD FINDINGS  Visualized distal vertebral arteries are patent. Vertebral arteries are codominant. PICA origins are patent. SCAs are patent. Basilar artery is patent with very minimal narrowing in its midportion. PCA origins and visualized branches are unremarkable. A left posterior communicating artery is identified.  Internal carotid arteries are patent from skullbase to carotid terminus. ACA and MCA origins and visualized branches are patent without evidence of stenosis. An anterior communicating artery is not identified. Wide-mouth aneurysm projecting inferiorly from the left MCA bifurcation measures 5 x 5 x 3 mm.  IMPRESSION: 1. No evidence of acute infarct or other acute intracranial abnormality. 2. Moderate chronic small vessel ischemic disease. 3. 5 mm left MCA bifurcation aneurysm. 4. Mild, smooth narrowing of the mid basilar artery. No evidence of flow limiting intracranial arterial stenosis.   Electronically Signed   By: Logan Bores   On: 09/18/2013 14:50   Mr Jodene Nam Head/brain Wo Cm  09/18/2013   CLINICAL DATA:  Fatigue and hypertension.  Evaluate for stroke.  EXAM: MRI HEAD WITHOUT CONTRAST  MRA HEAD WITHOUT CONTRAST  TECHNIQUE: Multiplanar, multiecho pulse sequences of the  brain and surrounding structures were obtained without intravenous contrast. Angiographic images of the head were obtained using MRA technique without contrast.  COMPARISON:  Head CT 09/18/2013 and brain MRI 04/09/2010  FINDINGS: MRI HEAD FINDINGS  Scattered and  confluent regions of T2 hyperintensity within the periventricular and subcortical regions are stable to mildly progressed from the prior exam and are compatible with moderate chronic small vessel ischemic disease. Remote lacunar infarct is noted in the left putamen with associated susceptibility artifact suggestive of remote hemorrhage. There is mild to moderate cerebral atrophy. There is no evidence of acute infarct, mass, midline shift, acute intracranial hemorrhage, or extra-axial fluid collection. Prior bilateral cataract surgery is noted. Mastoid air cells and paranasal sinuses are clear. Major intracranial vascular flow voids are unremarkable.  MRA HEAD FINDINGS  Visualized distal vertebral arteries are patent. Vertebral arteries are codominant. PICA origins are patent. SCAs are patent. Basilar artery is patent with very minimal narrowing in its midportion. PCA origins and visualized branches are unremarkable. A left posterior communicating artery is identified.  Internal carotid arteries are patent from skullbase to carotid terminus. ACA and MCA origins and visualized branches are patent without evidence of stenosis. An anterior communicating artery is not identified. Wide-mouth aneurysm projecting inferiorly from the left MCA bifurcation measures 5 x 5 x 3 mm.  IMPRESSION: 1. No evidence of acute infarct or other acute intracranial abnormality. 2. Moderate chronic small vessel ischemic disease. 3. 5 mm left MCA bifurcation aneurysm. 4. Mild, smooth narrowing of the mid basilar artery. No evidence of flow limiting intracranial arterial stenosis.   Electronically Signed   By: Logan Bores   On: 09/18/2013 14:50    EKG: sinus at  66/min Assessment/Plan Active Problems:   Weakness   TIA:/ let hand tingling / dizziness.: Admit to telemetry. Started TIA work up.  So far her CT, MRI, MRA negative for acute stroke. Resume pt's coumadin and add aspirin.  Echocardiogram and carotid duplex pending.  PT/OT/SLP consulted.  Neurology consulted.  Orthostatic vital signs.   Hypertension: Sub optimal. Resume home medications and further evaluate.   H/o DVT and PE: Resume coumadin as per pharmacy.   Code Status: full code Family Communication:family at bedside Disposition Plan: pending.   Time spent: 78 min  [facility] Pager [phone]

## 2013-09-18 NOTE — Progress Notes (Signed)
ANTICOAGULATION CONSULT NOTE - Initial Consult  Pharmacy Consult for Coumadin Indication: hx recurrent DVT and PE; hx CVA  Allergies  Allergen Reactions  . Penicillins Anaphylaxis  . Codeine Other (See Comments)    unknown  . Sulfonamide Derivatives Other (See Comments)    unknown    Patient Measurements: Height: 5\' 5"  (165.1 cm) Weight: 206 lb (93.441 kg) IBW/kg (Calculated) : 57  Vital Signs: Temp: 98.3 F (36.8 C) (01/12 2226) Temp src: Oral (01/12 2226) BP: 177/65 mmHg (01/12 2226) Pulse Rate: 68 (01/12 2226)  Labs:  Recent Labs  09/18/13 1049 09/18/13 1059 09/18/13 2132  HGB 13.9 15.3*  --   HCT 42.5 45.0  --   PLT 196  --   --   APTT 30  --   --   LABPROT  --   --  21.0*  INR  --   --  1.87*  CREATININE 1.19* 1.30*  --   TROPONINI <0.30  --   --     Estimated Creatinine Clearance: 42.3 ml/min (by C-G formula based on Cr of 1.3).   Medical History: Past Medical History  Diagnosis Date  . Asthma   . COPD (chronic obstructive pulmonary disease)   . DVT (deep vein thrombosis) in pregnancy     hx x multiple on coumadin  . Pulmonary embolism     x 2  . GERD (gastroesophageal reflux disease)   . Hypertension   . Osteopenia   . Hypothyroidism   . Hyperlipidemia   . Osteoarthritis     h/o spinal stenosis by MRI 2009  . Back pain   . S/P lumbar fusion   . Hiatal hernia   . Diverticulosis   . Hemorrhoids   . Tubular adenoma of colon 07/2005  . Anxiety and depression   . Anxiety   . Cataract   . Depression   . Stroke     MINI  . Diabetes mellitus    Assessment:  78 year old female admitted with left arm tingling, possible TIA.  Noted to have fallen at home. Head CT negative for bleed or acute stroke.   Home Coumadin regimen: 3 mg daily, except none on Saturdays.  INR is subtherapeutic at 1.87. Last outpatient INR was 2/2 on 08/29/13. She reports not missing any doses, other than she had not taken today's dose prior to coming to the hospital  today.  Goal of Therapy:  INR 2-3 Monitor platelets by anticoagulation protocol: Yes   Plan:    Coumadin 5 mg tonight.   Daily PT/INR.  Arty Baumgartner, [place] Pager: [phone] 09/18/2013,10:27 PM

## 2013-09-18 NOTE — ED Provider Notes (Signed)
CSN: [id number]     Arrival date & time 09/18/13  0947 History   First MD Initiated Contact with Patient 09/18/13 1000     Chief Complaint  Patient presents with  . Fatigue  . Hypertension   HPI  78 y/o female with history as noted below who presents with cc of headache, elevated blood pressure, fatigue and left sided weakness and numbness. The patient states that on Saturday she sustained a fall. 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 She is unsure the circumstances of her fall. She has mild weakness at baseline in her left leg but states that since that time she has had worsening left sided weakness, especially in her left leg. She states that this morning she woke up with a headache. She took her blood pressure and saw that it was elevated. She laid down to try to get it to go down but called EMS after he blood pressure didn't respond to relaxation techniques. She also states that she has tingling in her left arm and left leg. This started today.   Past Medical History  Diagnosis Date  . Asthma   . COPD (chronic obstructive pulmonary disease)   . DVT (deep vein thrombosis) in pregnancy     hx x multiple on coumadin  . Pulmonary embolism     x 2  . GERD (gastroesophageal reflux disease)   . Hypertension   . Osteopenia   . Hypothyroidism   . Hyperlipidemia   . Osteoarthritis     h/o spinal stenosis by MRI 2009  . Back pain   . S/P lumbar fusion   . Hiatal hernia   . Diverticulosis   . Hemorrhoids   . Tubular adenoma of colon 07/2005  . Anxiety and depression   . Anxiety   . Cataract   . Depression   . Stroke     MINI  . Diabetes mellitus    Past Surgical History  Procedure Laterality Date  . Appendectomy    . Cholecystectomy    . Tubal ligation    . Tonsillectomy    . Lumbar fusion    . Cataract extraction    . Colonoscopy    . Polypectomy     Family History  Problem Relation Age of Onset  . Adopted: Yes  . Cancer Mother     ? colon or ovarian  .  Diabetes Mother   . Cancer Brother     ?   History  Substance Use Topics  . Smoking status: Former Smoker    Types: Cigarettes  . Smokeless tobacco: Never Used  . Alcohol Use: No     Comment: socially   OB History   Grav Para Term Preterm Abortions TAB SAB Ect Mult Living                 Review of Systems  Constitutional: Negative for fever and chills.  Respiratory: Negative for cough.   Cardiovascular: Negative for chest pain.  Gastrointestinal: Negative for nausea, vomiting and abdominal pain.  Neurological: Positive for weakness, numbness and headaches.  All other systems reviewed and are negative.   Allergies  Penicillins; Codeine; and Sulfonamide derivatives  Home Medications   No current outpatient prescriptions on file. BP 108/55  Pulse 67  Temp(Src) 97.8 F (36.6 C) (Oral)  Resp 20  Ht 5\' 5"  (1.651 m)  Wt 206 lb (93.441 kg)  BMI 34.28 kg/m2  SpO2 95% Physical Exam  Nursing note and vitals reviewed. Constitutional: She  is oriented to person, place, and time. She appears well-developed and well-nourished. No distress.  HENT:  Head: Normocephalic and atraumatic.  Eyes: Conjunctivae are normal. Pupils are equal, round, and reactive to light.  Neck: Normal range of motion. Neck supple.  Cardiovascular: Normal rate and regular rhythm.  Exam reveals no gallop and no friction rub.   No murmur heard. Pulmonary/Chest: Effort normal and breath sounds normal.  Abdominal: Soft. She exhibits no distension. There is no tenderness.  Musculoskeletal: Normal range of motion. She exhibits no edema and no tenderness.  Neurological: She is alert and oriented to person, place, and time. She has normal reflexes. No cranial nerve deficit or sensory deficit.  Mild weakness in left upper and left lower extremity -5/5. Full strength in all additional extremities.   Skin: Skin is warm and dry.  Psychiatric: She has a normal mood and affect.    ED Course  Procedures (including  critical care time) Labs Review Labs Reviewed  COMPREHENSIVE METABOLIC PANEL - Abnormal; Notable for the following:    Glucose, Bld 120 (*)    Creatinine, Ser 1.19 (*)    Albumin 3.2 (*)    GFR calc non Af Amer 44 (*)    GFR calc Af Amer 50 (*)    All other components within normal limits  URINALYSIS, ROUTINE W REFLEX MICROSCOPIC - Abnormal; Notable for the following:    Hgb urine dipstick MODERATE (*)    Protein, ur 100 (*)    All other components within normal limits  GLUCOSE, CAPILLARY - Abnormal; Notable for the following:    Glucose-Capillary 109 (*)    All other components within normal limits  HEMOGLOBIN A1C - Abnormal; Notable for the following:    Hemoglobin A1C 6.6 (*)    Mean Plasma Glucose 143 (*)    All other components within normal limits  GLUCOSE, CAPILLARY - Abnormal; Notable for the following:    Glucose-Capillary 120 (*)    All other components within normal limits  GLUCOSE, CAPILLARY - Abnormal; Notable for the following:    Glucose-Capillary 125 (*)    All other components within normal limits  PROTIME-INR - Abnormal; Notable for the following:    Prothrombin Time 21.0 (*)    INR 1.87 (*)    All other components within normal limits  PROTIME-INR - Abnormal; Notable for the following:    Prothrombin Time 21.3 (*)    INR 1.91 (*)    All other components within normal limits  GLUCOSE, CAPILLARY - Abnormal; Notable for the following:    Glucose-Capillary 127 (*)    All other components within normal limits  GLUCOSE, CAPILLARY - Abnormal; Notable for the following:    Glucose-Capillary 104 (*)    All other components within normal limits  GLUCOSE, CAPILLARY - Abnormal; Notable for the following:    Glucose-Capillary 121 (*)    All other components within normal limits  POCT I-STAT, CHEM 8 - Abnormal; Notable for the following:    Creatinine, Ser 1.30 (*)    Glucose, Bld 120 (*)    Calcium, Ion 1.11 (*)    Hemoglobin 15.3 (*)    All other components  within normal limits  APTT  CBC  DIFFERENTIAL  TROPONIN I  URINE RAPID DRUG SCREEN (HOSP PERFORMED)  URINE MICROSCOPIC-ADD ON  LIPID PANEL  TSH  PROTIME-INR   Imaging Review Dg Chest 2 View  09/18/2013   CLINICAL DATA:  Shortness of breath, cough  EXAM: CHEST  2 VIEW  COMPARISON:  Prior radiograph from 03/24/2007  FINDINGS: The cardiac and mediastinal silhouettes are stable in size and contour, and remain within normal limits.  The lungs are normally inflated. No airspace consolidation, pleural effusion, or pulmonary edema is identified. There is no pneumothorax.  No acute osseous abnormality identified. Moderate multilevel degenerative disc disease noted within the visualized spine. IVC filter overlies the upper abdomen.  IMPRESSION: No active cardiopulmonary disease.   Electronically Signed   By: Jeannine Boga M.D.   On: 09/18/2013 14:31   Ct Head Wo Contrast  09/18/2013   CLINICAL DATA:  Fatigue/altered mental status  EXAM: CT HEAD WITHOUT CONTRAST  TECHNIQUE: Contiguous axial images were obtained from the base of the skull through the vertex without intravenous contrast. Study was obtained within 24 hr of patient's arrival at the emergency department.  COMPARISON:  Brain CT April 09, 2010 and brain MRI April 09, 2010  FINDINGS: There is age related volume loss. There is no mass, hemorrhage, extra-axial fluid collection, or midline shift. There is evidence of a prior lacunar infarct in the left putaminal. . There is patchy small vessel disease throughout the centra semiovale bilaterally. There is evidence of a prior small infarct in the left putamen, stable. There is no demonstrable acute infarct. Bony calvarium appears intact. The mastoid air cells are clear.  IMPRESSION: Age related volume loss with moderate small vessel disease in the periventricular white matter, stable. Prior small infarct in left putamen. There is no demonstrable mass, hemorrhage, or acute appearing infarct.    Electronically Signed   By: Lowella Grip M.D.   On: 09/18/2013 11:08   Mr Brain Wo Contrast  09/18/2013   CLINICAL DATA:  Fatigue and hypertension.  Evaluate for stroke.  EXAM: MRI HEAD WITHOUT CONTRAST  MRA HEAD WITHOUT CONTRAST  TECHNIQUE: Multiplanar, multiecho pulse sequences of the brain and surrounding structures were obtained without intravenous contrast. Angiographic images of the head were obtained using MRA technique without contrast.  COMPARISON:  Head CT 09/18/2013 and brain MRI 04/09/2010  FINDINGS: MRI HEAD FINDINGS  Scattered and confluent regions of T2 hyperintensity within the periventricular and subcortical regions are stable to mildly progressed from the prior exam and are compatible with moderate chronic small vessel ischemic disease. Remote lacunar infarct is noted in the left putamen with associated susceptibility artifact suggestive of remote hemorrhage. There is mild to moderate cerebral atrophy. There is no evidence of acute infarct, mass, midline shift, acute intracranial hemorrhage, or extra-axial fluid collection. Prior bilateral cataract surgery is noted. Mastoid air cells and paranasal sinuses are clear. Major intracranial vascular flow voids are unremarkable.  MRA HEAD FINDINGS  Visualized distal vertebral arteries are patent. Vertebral arteries are codominant. PICA origins are patent. SCAs are patent. Basilar artery is patent with very minimal narrowing in its midportion. PCA origins and visualized branches are unremarkable. A left posterior communicating artery is identified.  Internal carotid arteries are patent from skullbase to carotid terminus. ACA and MCA origins and visualized branches are patent without evidence of stenosis. An anterior communicating artery is not identified. Wide-mouth aneurysm projecting inferiorly from the left MCA bifurcation measures 5 x 5 x 3 mm.  IMPRESSION: 1. No evidence of acute infarct or other acute intracranial abnormality. 2. Moderate  chronic small vessel ischemic disease. 3. 5 mm left MCA bifurcation aneurysm. 4. Mild, smooth narrowing of the mid basilar artery. No evidence of flow limiting intracranial arterial stenosis.   Electronically Signed   By: Logan Bores   On: 09/18/2013 14:50  Mr Jodene Nam Head/brain Wo Cm  09/18/2013   CLINICAL DATA:  Fatigue and hypertension.  Evaluate for stroke.  EXAM: MRI HEAD WITHOUT CONTRAST  MRA HEAD WITHOUT CONTRAST  TECHNIQUE: Multiplanar, multiecho pulse sequences of the brain and surrounding structures were obtained without intravenous contrast. Angiographic images of the head were obtained using MRA technique without contrast.  COMPARISON:  Head CT 09/18/2013 and brain MRI 04/09/2010  FINDINGS: MRI HEAD FINDINGS  Scattered and confluent regions of T2 hyperintensity within the periventricular and subcortical regions are stable to mildly progressed from the prior exam and are compatible with moderate chronic small vessel ischemic disease. Remote lacunar infarct is noted in the left putamen with associated susceptibility artifact suggestive of remote hemorrhage. There is mild to moderate cerebral atrophy. There is no evidence of acute infarct, mass, midline shift, acute intracranial hemorrhage, or extra-axial fluid collection. Prior bilateral cataract surgery is noted. Mastoid air cells and paranasal sinuses are clear. Major intracranial vascular flow voids are unremarkable.  MRA HEAD FINDINGS  Visualized distal vertebral arteries are patent. Vertebral arteries are codominant. PICA origins are patent. SCAs are patent. Basilar artery is patent with very minimal narrowing in its midportion. PCA origins and visualized branches are unremarkable. A left posterior communicating artery is identified.  Internal carotid arteries are patent from skullbase to carotid terminus. ACA and MCA origins and visualized branches are patent without evidence of stenosis. An anterior communicating artery is not identified.  Wide-mouth aneurysm projecting inferiorly from the left MCA bifurcation measures 5 x 5 x 3 mm.  IMPRESSION: 1. No evidence of acute infarct or other acute intracranial abnormality. 2. Moderate chronic small vessel ischemic disease. 3. 5 mm left MCA bifurcation aneurysm. 4. Mild, smooth narrowing of the mid basilar artery. No evidence of flow limiting intracranial arterial stenosis.   Electronically Signed   By: Logan Bores   On: 09/18/2013 14:50    EKG Interpretation    Date/Time:  Monday September 18 2013 09:51:53 EST Ventricular Rate:  66 PR Interval:  67 QRS Duration: 93 QT Interval:  408 QTC Calculation: 427 R Axis:   -38 Text Interpretation:  Sinus rhythm Multiform ventricular premature complexes Short PR interval Inferior infarct, old  Similar to prior Confirmed by [facility]  MD, [place] ([id number]) on 09/18/2013 9:58:50 AM           MDM   Here with cc of headache and weakness. Has worsening left sided weakness than her baseline. Has associated tingling in left hand and left foot. BP now improved. Exam with some mild left sided weakness. CT head normal. Labs unremarkable. Pt states her weakness is making it difficult for her to ambulate and she is scared she may fall. Consulted neurology who recommended MRI to evaluate for acute CVA. The patient was admitted to internal medicine for continued workup.   1. Weakness   2. Dizziness and giddiness   3. DVT (deep venous thrombosis)   4. TIA (transient ischemic attack)        Donita Brooks, MD 09/19/13 [phone]

## 2013-09-18 NOTE — ED Notes (Signed)
Per EMS - pt coming from home. BP 210/120. Pt c/o increased weakness since Saturday. Denies n/v/d. No decreased appetite. HR 72 NSR. 98% room air. Pt has some tremors, reports that is normal for her. Nad, skin warm and dry, resp e/u.

## 2013-09-19 DIAGNOSIS — R209 Unspecified disturbances of skin sensation: Secondary | ICD-10-CM

## 2013-09-19 DIAGNOSIS — R5383 Other fatigue: Secondary | ICD-10-CM

## 2013-09-19 DIAGNOSIS — I82409 Acute embolism and thrombosis of unspecified deep veins of unspecified lower extremity: Secondary | ICD-10-CM

## 2013-09-19 DIAGNOSIS — R42 Dizziness and giddiness: Secondary | ICD-10-CM

## 2013-09-19 DIAGNOSIS — R5381 Other malaise: Secondary | ICD-10-CM

## 2013-09-19 DIAGNOSIS — G459 Transient cerebral ischemic attack, unspecified: Secondary | ICD-10-CM | POA: Diagnosis present

## 2013-09-19 DIAGNOSIS — I517 Cardiomegaly: Secondary | ICD-10-CM

## 2013-09-19 LAB — GLUCOSE, CAPILLARY
Glucose-Capillary: 127 mg/dL — ABNORMAL HIGH (ref 70–99)
Glucose-Capillary: 104 mg/dL — ABNORMAL HIGH (ref 70–99)
Glucose-Capillary: 121 mg/dL — ABNORMAL HIGH (ref 70–99)
Glucose-Capillary: 109 mg/dL — ABNORMAL HIGH (ref 70–99)

## 2013-09-19 LAB — HEMOGLOBIN A1C
Hgb A1c MFr Bld: 6.6 % — ABNORMAL HIGH (ref <5.7)
Mean Plasma Glucose: 143 mg/dL — ABNORMAL HIGH (ref <117)

## 2013-09-19 LAB — LIPID PANEL
Cholesterol: 147 mg/dL (ref 0–200)
HDL: 44 mg/dL (ref >39)
LDL Cholesterol: 79 mg/dL (ref 0–99)
Total CHOL/HDL Ratio: 3.3 RATIO
Triglycerides: 120 mg/dL (ref <150)
VLDL: 24 mg/dL (ref 0–40)

## 2013-09-19 LAB — TSH: TSH: 3.053 u[IU]/mL (ref 0.350–4.500)

## 2013-09-19 LAB — PROTIME-INR
INR: 1.91 — ABNORMAL HIGH (ref 0.00–1.49)
Prothrombin Time: 21.3 seconds — ABNORMAL HIGH (ref 11.6–15.2)

## 2013-09-19 MED ORDER — INSULIN ASPART 100 UNIT/ML ~~LOC~~ SOLN
0.0000 [IU] | Freq: Three times a day (TID) | SUBCUTANEOUS | Status: DC
  Administered 2013-09-20 – 2013-09-21 (×3): 1 [IU] via SUBCUTANEOUS

## 2013-09-19 MED ORDER — WARFARIN SODIUM 4 MG PO TABS
4.0000 mg | ORAL_TABLET | Freq: Once | ORAL | Status: AC
  Administered 2013-09-19: 4 mg via ORAL
  Filled 2013-09-19: qty 1

## 2013-09-19 MED ORDER — LISINOPRIL 10 MG PO TABS
10.0000 mg | ORAL_TABLET | Freq: Every day | ORAL | Status: DC
  Administered 2013-09-19 – 2013-09-21 (×3): 10 mg via ORAL
  Filled 2013-09-19 (×3): qty 1

## 2013-09-19 MED ORDER — INSULIN ASPART 100 UNIT/ML ~~LOC~~ SOLN: 0.0000 [IU] | Freq: Every day | SUBCUTANEOUS | Status: DC

## 2013-09-19 NOTE — Progress Notes (Signed)
PT Cancellation Note  Patient Details Name: Dawn Morrow MRN: [id number] DOB: 1935-12-02   Cancelled Treatment:      Patient currently on bedrest, will follow for updated activity orders before proceeding with evaluation.   Duncan Dull 09/19/2013, 7:40 AM Alben Deeds, PT DPT  [phone]

## 2013-09-19 NOTE — Evaluation (Signed)
Clinical/Bedside Swallow Evaluation Patient Details  Name: Dawn Morrow MRN: [id number] Date of Birth: [birth year]  Today's Date: 09/19/2013 Time: 7353-2992 SLP Time Calculation (min): 22 min  Past Medical History:  Past Medical History  Diagnosis Date  . Asthma   . COPD (chronic obstructive pulmonary disease)   . DVT (deep vein thrombosis) in pregnancy     hx x multiple on coumadin  . Pulmonary embolism     x 2  . GERD (gastroesophageal reflux disease)   . Hypertension   . Osteopenia   . Hypothyroidism   . Hyperlipidemia   . Osteoarthritis     h/o spinal stenosis by MRI 2009  . Back pain   . S/P lumbar fusion   . Hiatal hernia   . Diverticulosis   . Hemorrhoids   . Tubular adenoma of colon 07/2005  . Anxiety and depression   . Anxiety   . Cataract   . Depression   . Stroke     MINI  . Diabetes mellitus    Past Surgical History:  Past Surgical History  Procedure Laterality Date  . Appendectomy    . Cholecystectomy    . Tubal ligation    . Tonsillectomy    . Lumbar fusion    . Cataract extraction    . Colonoscopy    . Polypectomy     HPI:  Dawn Morrow is a 78 y.o. female with h/o asthma, copd, DVT, PE, hypertension came in for generalized weakness, left arm tingling since yesterday. She also reports she felt on the cat litter box. She doesn't remember if she lost conscious ness . She lives by herself in [place] and her family lives in [place]. Family is concerned that she is not safe at home by herself and she had multiple falls in the past.  On arrival to ED, she was evaluate dfor TIA, and a CT head was obtained , it was negative for any acute intracranial findings. It was followed with an MRI OF THE head , which was negative for acute stroke. She is referred to medical service for admission, for further work up for [place].     Assessment / Plan / Recommendation Clinical Impression  Patient appears to have functional speech and language as well as basic cognitive  skills.  Pt. states "I can't remember things like I used to, but that's just old age."      Aspiration Risk       Diet Recommendation          Other  Recommendations     Follow Up Recommendations  Skilled Nursing facility    Frequency and Duration        Pertinent Vitals/Pain n/a    SLP Swallow Goals  n/a   Swallow Study Prior Functional Status  Cognitive/Linguistic Baseline: Information not available Type of Home: House  Lives With: Alone Available Help at Discharge: Other (Comment) (reports caregiver comes in 1x/day)    General HPI: Dawn Morrow is a 78 y.o. female with h/o asthma, copd, DVT, PE, hypertension came in for generalized weakness, left arm tingling since yesterday. She also reports she felt on the cat litter box. She doesn't remember if she lost conscious ness . She lives by herself in [place] and her family lives in [place]. Family is concerned that she is not safe at home by herself and she had multiple falls in the past.  On arrival to ED, she was evaluate dfor TIA, and a CT head was obtained , it  was negative for any acute intracranial findings. It was followed with an MRI OF THE head , which was negative for acute stroke. She is referred to medical service for admission, for further work up for [place].      Oral/Motor/Sensory Function Overall Oral Motor/Sensory Function: Appears within functional limits for tasks assessed   Ice Chips     Thin Liquid      Nectar Thick     Honey Thick     Puree     Solid   GO            Dawn Morrow T 09/19/2013,3:18 PM

## 2013-09-19 NOTE — Evaluation (Signed)
Occupational Therapy Evaluation Patient Details Name: Dawn Morrow MRN: [id number] DOB: 1936/03/17 Today's Date: 09/19/2013 Time: 8101-7510 OT Time Calculation (min): 24 min  OT Assessment / Plan / Recommendation History of present illness  78 y.o. Admitted with tingling in left arm. MRI negative for acute infarct.   Clinical Impression   Pt presents with below problem list. Pt requiring Min A for ADLs, PTA. Feel pt would benefit from acute OT to increase independence and safety prior to d/c. Recommending SNF for additional rehab.     OT Assessment  Patient needs continued OT Services    Follow Up Recommendations  SNF;Supervision/Assistance - 24 hour    Barriers to Discharge Decreased caregiver support    Equipment Recommendations  Tub/shower seat;Other (comment);3 in 1 bedside comode (flat shower chair)    Recommendations for Other Services    Frequency  Min 2X/week    Precautions / Restrictions Precautions Precautions: Fall Restrictions Weight Bearing Restrictions: No   Pertinent Vitals/Pain Soreness in chest. Nurse notified. BP 139/94 sitting EOB.     ADL  Grooming: Wash/dry hands;Brushing hair;Min guard Where Assessed - Grooming: Supported standing Lower Body Dressing: Minimal assistance Where Assessed - Lower Body Dressing: Supported sit to Lobbyist: Magazine features editor Method: Sit to Loss adjuster, chartered: Comfort height toilet;Grab bars Toileting - Water quality scientist and Hygiene: Min guard Where Assessed - Best boy and Hygiene: Standing;Sit on 3-in-1 or toilet (hygiene-sitting and clothing-standing) Tub/Shower Transfer Method: Not assessed Equipment Used: Gait belt;Rolling walker Transfers/Ambulation Related to ADLs: Min A for ambulation; Min guard/Min A for transfers. ADL Comments: Recommended sitting on chair for dressing and to stand in front of chair/bed with walker in front when pulling up LB clothing (not  to do these things alone). Educated on tub transfer technique as pt states it has been difficult to step in and out of tub at home. Educated on energy conservation techniques as pt was so fatigued during session. Told pt to be using left hand during activities and told her some activities she can be doing with that hand.    OT Diagnosis: Generalized weakness  OT Problem List: Decreased strength;Decreased activity tolerance;Impaired balance (sitting and/or standing);Impaired vision/perception;Decreased coordination;Pain;Decreased knowledge of precautions;Decreased knowledge of use of DME or AE;Impaired sensation OT Treatment Interventions: Self-care/ADL training;Therapeutic exercise;DME and/or AE instruction;Therapeutic activities;Patient/family education;Balance training;Visual/perceptual remediation/compensation;Energy conservation   OT Goals(Current goals can be found in the care plan section) Acute Rehab OT Goals Patient Stated Goal: be able to walk OT Goal Formulation: With patient Time For Goal Achievement: 09/26/13 Potential to Achieve Goals: Good ADL Goals Pt Will Perform Lower Body Bathing: sit to/from stand;with set-up Pt Will Perform Lower Body Dressing: with set-up;sit to/from stand Pt Will Transfer to Toilet: with supervision;ambulating (3 in 1 over commode) Pt Will Perform Toileting - Clothing Manipulation and hygiene: sit to/from stand;with modified independence  Visit Information  Last OT Received On: 09/19/13 Assistance Needed: +1       Prior [place] expects to be discharged to:: Private residence Living Arrangements: Alone Available Help at Discharge: Other (Comment) (reports caregiver comes in 1x/day) Home Access: Stairs to enter Entrance Stairs-Number of Steps: 5 Entrance Stairs-Rails: Left Home Layout: One level Home Equipment: Walker - 4 wheels;Grab bars - tub/shower;Hand held shower head Prior Function Level of  Independence: Needs assistance ADL's / Homemaking Assistance Needed: caregiver assisted minimally with bathing and with LB dressing Communication Communication: No difficulties Dominant Hand: Right  Vision/Perception Vision - History Baseline Vision: Wears glasses all the time Patient Visual Report: No change from baseline (been having difficulty reading for a while )   Cognition  Cognition Arousal/Alertness: Awake/alert Behavior During Therapy: WFL for tasks assessed/performed Overall Cognitive Status: Within Functional Limits for tasks assessed    Extremity/Trunk Assessment Upper Extremity Assessment Upper Extremity Assessment: LUE deficits/detail LUE Deficits / Details: 3+/5 shoulder flexion, weak grasp LUE Sensation: decreased light touch LUE Coordination: decreased fine motor Lower Extremity Assessment Lower Extremity Assessment: Defer to PT evaluation     Mobility Bed Mobility Overal bed mobility: Needs Assistance Bed Mobility: Supine to Sit Supine to sit: Min guard General bed mobility comments: Min guard for safety. Transfers Overall transfer level: Needs assistance Equipment used: Rolling walker (2 wheeled) Transfers: Sit to/from Stand Sit to Stand: Min guard;Min assist General transfer comment: Min A for stand to sit in recliner chair. Cues for technique.     Exercise     Balance     End of Session OT - End of Session Equipment Utilized During Treatment: Gait belt;Rolling walker Activity Tolerance: Patient limited by fatigue Patient left: in chair;with call bell/phone within reach Nurse Communication: Mobility status;Other (comment) (soreness in chest and BP)  GO     Benito Mccreedy OTR/L [phone] 09/19/2013, 11:36 AM

## 2013-09-19 NOTE — Progress Notes (Signed)
ANTICOAGULATION CONSULT NOTE - Follow Up Consult  Pharmacy Consult:  Coumadin Indication:  History of recurrent DVT and PE  Allergies  Allergen Reactions  . Penicillins Anaphylaxis  . Codeine Other (See Comments)    unknown  . Sulfonamide Derivatives Other (See Comments)    unknown    Patient Measurements: Height: 5\' 5"  (165.1 cm) Weight: 206 lb (93.441 kg) IBW/kg (Calculated) : 57  Vital Signs: Temp: 97.5 F (36.4 C) (01/13 0900) Temp src: Oral (01/13 0900) BP: 164/75 mmHg (01/13 0900) Pulse Rate: 68 (01/13 0900)  Labs:  Recent Labs  09/18/13 1049 09/18/13 1059 09/18/13 2132 09/19/13 0345  HGB 13.9 15.3*  --   --   HCT 42.5 45.0  --   --   PLT 196  --   --   --   APTT 30  --   --   --   LABPROT  --   --  21.0* 21.3*  INR  --   --  1.87* 1.91*  CREATININE 1.19* 1.30*  --   --   TROPONINI <0.30  --   --   --     Estimated Creatinine Clearance: 42.3 ml/min (by C-G formula based on Cr of 1.3).      Assessment: 89 YOF with history of CVA and history of recurrent PE and DVTs to continue on Coumadin.  INR slightly sub-therapeutic.  No bleeding reported.   Goal of Therapy:  INR 2-3 Monitor platelets by anticoagulation protocol: Yes    Plan:  - Coumadin 4mg  PO today - Daily PT / INR    Wilkin Lippy D. Mina Marble, PharmD, BCPS Pager:  [phone] 09/19/2013, 1:27 PM

## 2013-09-19 NOTE — Clinical Social Work Placement (Signed)
Clinical Social Work Department CLINICAL SOCIAL WORK PLACEMENT NOTE 09/19/2013  Patient:  Pennick,Idil  Account Number:  [id number] [place] date:  09/18/2013  Clinical Social Worker:  Wylene Men  Date/time:  09/19/2013 12:55 PM  Clinical Social Work is seeking post-discharge placement for this patient at the following level of care:   [place]   (*CSW will update this form in Epic as items are completed)   09/19/2013  Patient/family provided with [place] Department of Clinical Social Work's list of facilities offering this level of care within the geographic area requested by the patient (or if unable, by the patient's family).  09/19/2013  Patient/family informed of their freedom to choose among providers that offer the needed level of care, that participate in Medicare, Medicaid or managed care program needed by the patient, have an available bed and are willing to accept the patient.  09/19/2013  Patient/family informed of [facility]' ownership interest in [facility], as well as of the fact that they are under no obligation to receive care at this facility.  PASARR submitted to EDS on 09/19/2013 PASARR number received from EDS on 09/19/2013  FL2 transmitted to all facilities in geographic area requested by pt/family on  09/19/2013 FL2 transmitted to all facilities within larger geographic area on   Patient informed that his/her managed care company has contracts with or will negotiate with  certain facilities, including the following:     Patient/family informed of bed offers received:   Patient chooses bed at  Physician recommends and patient chooses bed at    Patient to be transferred to  on   Patient to be transferred to facility by   The following physician request were entered in Epic:   Additional Comments:  Nonnie Done, [place] [phone]  Clinical Social Work

## 2013-09-19 NOTE — Clinical Social Work Psychosocial (Signed)
Clinical Social Work Department BRIEF PSYCHOSOCIAL ASSESSMENT 09/19/2013  Patient:  Dawn Morrow,Dawn Morrow     Account Number:  [id number]     Admit date:  09/18/2013  Clinical Social Worker:  Wylene Men  Date/Time:  09/19/2013 02:21 PM  Referred by:  Physician  Date Referred:  09/19/2013 Referred for  SNF Placement   Other Referral:   Interview type:  Other - See comment Other interview type:   CSW spoke with neice and pt    PSYCHOSOCIAL DATA Living Status:  ALONE Admitted from facility:   Level of care:   Primary support name:  [name] Primary support relationship to patient:  FAMILY Degree of support available:   strong    CURRENT CONCERNS Current Concerns  Post-Acute Placement   Other Concerns:   none    SOCIAL WORK ASSESSMENT / PLAN CSW assessed pt at bedside.  Pt was alert and oriented and remained pleasant throughout assessment.  Pt neice was at bedside and remained during assessment per pt request.  PT has recommended pt receive STR in SNF prior to being dc home.  Pt is agreeable to this, but unfamiliar with SNFs. Neices and nephews are familiar per neice at bedside and will assist pt in making the decision.    CSW provided family bed offers of facilites that have offered a bed.  Family to review and get back with unit CSW.    [name] stated the family is coping well with pt health though daily activities are beginning to be difficult. [name] says it helps that pt is realistic regarding her needs and abilities.   Assessment/plan status:  Psychosocial Support/Ongoing Assessment of Needs Other assessment/ plan:   possible Assisted Living after SNF - CSW discussed the need for SNF per PT after dc and prior to trying to live independently.  Family and pt agreeable and acknowleged understanding.   Information/referral to community resources:   SNF  ALF    PATIENT'S/FAMILY'S RESPONSE TO PLAN OF CARE: pt and family were agreeable to plan and appreciated CSW assistance and  support during this process.       Nonnie Done, [place] [phone]  Clinical Social Work  Coverage for unit [place], [place]

## 2013-09-19 NOTE — Discharge Summary (Addendum)
Physician Discharge Summary  Dawn Morrow [id number] DOB: 1936-07-22 DOA: 09/18/2013  PCP: Kathlene November, MD  Admit date: 09/18/2013 Discharge date: 09/19/2013  Time spent: 30 minutes  Recommendations for Outpatient Follow-up:  1. Follow up withPCP in one to 2 weeks.  2. Follow up with NEUROLOGY as needed.   Discharge Diagnoses:  Active Problems:   HYPOTHYROIDISM   DIABETES MELLITUS, TYPE II   HYPERLIPIDEMIA   HYPERTENSION   PE (pulmonary embolism)   DVT (deep venous thrombosis)   Weakness   TIA (transient ischemic attack)   Discharge Condition: improved.   Diet recommendation: low sodium diet  Filed Weights   09/18/13 0955 09/18/13 1609  Weight: 92.987 kg (205 lb) 93.441 kg (206 lb)    History of present illness:  Dawn Morrow is a 78 y.o. female with h/o asthma, copd, DVT, PE, hypertension came in for generalized weakness, left arm tingling since yesterday. She also reports she felt on the cat litter box. She doesn't remember if she lost conscious ness . She lives by herself in [place] and her family lives in [place]. Family is concerned that she is not safe at home by herself and she had multiple falls in the past. On arrival to ED, she was evaluate dfor TIA, and a CT head was obtained , it was negative for any acute intracranial findings. It was followed with an MRI OF THE head , which was negative for acute stroke. She is referred to medical service for admission, for further work up for [place].  Neurology was consulted by EDP for further recommendations   Hospital Course:   Left arm tingling and numbness/TIA: Admitted to telemetry. Symptoms improved.  MRI brain does not show acute stroke. MRA head and neck showed 3.5 mm left MCA bifurcation aneurysm.  Echocardiogram showed grade 1 diastolic dysfunction. Carotid duplex showed 1 to 39%  ICA stenosis.  LDL IS 79 and TSH is 3.053. hgba1c is pending.  PT evaluation recommended SNF placement.    Accelerated Hypertension:   Better today. Resume home medications and titrate asneeded.   H/o DVT and PE: Resume coumadin> INR sub therapeutic. Will keep her overnight and add on aspirin to bridge till INR becomes therapeutic and then plan for discharge.    Hypothyroidism: Continue with synthroid.   Asthma/ COPD: Stable nebs as needed.    DVT prophylaxis.     Procedures:  MRI brain  mra head and neck  Echocardiogram   Carotid duplex.   Consultations:  Neuro consult.   Discharge Exam: Filed Vitals:   09/19/13 0900  BP: 164/75  Pulse: 68  Temp: 97.5 F (36.4 C)  Resp: 18    General: alert afebrile comfortable Cardiovascular: s1s2 Respiratory: ctab  Discharge Instructions       Future Appointments Provider Department Dept Phone   09/19/2013 2:15 PM [facility] [place] [facility] [phone]       Medication List    ASK your doctor about these medications       albuterol 108 (90 BASE) MCG/ACT inhaler  Commonly known as:  VENTOLIN HFA  Inhale 2 puffs into the lungs every 4 (four) hours as needed.     aspirin-acetaminophen-caffeine [id number] MG per tablet  Commonly known as:  EXCEDRIN MIGRAINE  Take 2 tablets by mouth every 6 (six) hours as needed for headache.     atorvastatin 40 MG tablet  Commonly known as:  LIPITOR  Take 40 mg by mouth daily.     buPROPion 300 MG  24 hr tablet  Commonly known as:  WELLBUTRIN XL  Take 300 mg by mouth daily.     carvedilol 12.5 MG tablet  Commonly known as:  COREG  Take 1 tablet (12.5 mg total) by mouth daily.     ezetimibe 10 MG tablet  Commonly known as:  ZETIA  Take 10 mg by mouth daily.     levothyroxine 150 MCG tablet  Commonly known as:  SYNTHROID, LEVOTHROID  Take 150 mcg by mouth daily.     lisinopril 10 MG tablet  Commonly known as:  PRINIVIL,ZESTRIL  Take 1 tablet (10 mg total) by mouth daily.     meclizine 25 MG tablet  Commonly known as:  ANTIVERT  Take 1 tablet (25 mg total) by  mouth at bedtime.     warfarin 6 MG tablet  Commonly known as:  COUMADIN  Take 3 mg by mouth See admin instructions. Takes 3mg  daily every day of week except for Saturday-takes nothing       Allergies  Allergen Reactions  . Penicillins Anaphylaxis  . Codeine Other (See Comments)    unknown  . Sulfonamide Derivatives Other (See Comments)    unknown      The results of significant diagnostics from this hospitalization (including imaging, microbiology, ancillary and laboratory) are listed below for reference.    Significant Diagnostic Studies: Dg Chest 2 View  09/18/2013   CLINICAL DATA:  Shortness of breath, cough  EXAM: CHEST  2 VIEW  COMPARISON:  Prior radiograph from 03/24/2007  FINDINGS: The cardiac and mediastinal silhouettes are stable in size and contour, and remain within normal limits.  The lungs are normally inflated. No airspace consolidation, pleural effusion, or pulmonary edema is identified. There is no pneumothorax.  No acute osseous abnormality identified. Moderate multilevel degenerative disc disease noted within the visualized spine. IVC filter overlies the upper abdomen.  IMPRESSION: No active cardiopulmonary disease.   Electronically Signed   By: Jeannine Boga M.D.   On: 09/18/2013 14:31   Ct Head Wo Contrast  09/18/2013   CLINICAL DATA:  Fatigue/altered mental status  EXAM: CT HEAD WITHOUT CONTRAST  TECHNIQUE: Contiguous axial images were obtained from the base of the skull through the vertex without intravenous contrast. Study was obtained within 24 hr of patient's arrival at the emergency department.  COMPARISON:  Brain CT April 09, 2010 and brain MRI April 09, 2010  FINDINGS: There is age related volume loss. There is no mass, hemorrhage, extra-axial fluid collection, or midline shift. There is evidence of a prior lacunar infarct in the left putaminal. . There is patchy small vessel disease throughout the centra semiovale bilaterally. There is evidence of a  prior small infarct in the left putamen, stable. There is no demonstrable acute infarct. Bony calvarium appears intact. The mastoid air cells are clear.  IMPRESSION: Age related volume loss with moderate small vessel disease in the periventricular white matter, stable. Prior small infarct in left putamen. There is no demonstrable mass, hemorrhage, or acute appearing infarct.   Electronically Signed   By: Lowella Grip M.D.   On: 09/18/2013 11:08   Mr Brain Wo Contrast  09/18/2013   CLINICAL DATA:  Fatigue and hypertension.  Evaluate for stroke.  EXAM: MRI HEAD WITHOUT CONTRAST  MRA HEAD WITHOUT CONTRAST  TECHNIQUE: Multiplanar, multiecho pulse sequences of the brain and surrounding structures were obtained without intravenous contrast. Angiographic images of the head were obtained using MRA technique without contrast.  COMPARISON:  Head CT 09/18/2013 and  brain MRI 04/09/2010  FINDINGS: MRI HEAD FINDINGS  Scattered and confluent regions of T2 hyperintensity within the periventricular and subcortical regions are stable to mildly progressed from the prior exam and are compatible with moderate chronic small vessel ischemic disease. Remote lacunar infarct is noted in the left putamen with associated susceptibility artifact suggestive of remote hemorrhage. There is mild to moderate cerebral atrophy. There is no evidence of acute infarct, mass, midline shift, acute intracranial hemorrhage, or extra-axial fluid collection. Prior bilateral cataract surgery is noted. Mastoid air cells and paranasal sinuses are clear. Major intracranial vascular flow voids are unremarkable.  MRA HEAD FINDINGS  Visualized distal vertebral arteries are patent. Vertebral arteries are codominant. PICA origins are patent. SCAs are patent. Basilar artery is patent with very minimal narrowing in its midportion. PCA origins and visualized branches are unremarkable. A left posterior communicating artery is identified.  Internal carotid arteries  are patent from skullbase to carotid terminus. ACA and MCA origins and visualized branches are patent without evidence of stenosis. An anterior communicating artery is not identified. Wide-mouth aneurysm projecting inferiorly from the left MCA bifurcation measures 5 x 5 x 3 mm.  IMPRESSION: 1. No evidence of acute infarct or other acute intracranial abnormality. 2. Moderate chronic small vessel ischemic disease. 3. 5 mm left MCA bifurcation aneurysm. 4. Mild, smooth narrowing of the mid basilar artery. No evidence of flow limiting intracranial arterial stenosis.   Electronically Signed   By: Logan Bores   On: 09/18/2013 14:50   Mr Jodene Nam Head/brain Wo Cm  09/18/2013   CLINICAL DATA:  Fatigue and hypertension.  Evaluate for stroke.  EXAM: MRI HEAD WITHOUT CONTRAST  MRA HEAD WITHOUT CONTRAST  TECHNIQUE: Multiplanar, multiecho pulse sequences of the brain and surrounding structures were obtained without intravenous contrast. Angiographic images of the head were obtained using MRA technique without contrast.  COMPARISON:  Head CT 09/18/2013 and brain MRI 04/09/2010  FINDINGS: MRI HEAD FINDINGS  Scattered and confluent regions of T2 hyperintensity within the periventricular and subcortical regions are stable to mildly progressed from the prior exam and are compatible with moderate chronic small vessel ischemic disease. Remote lacunar infarct is noted in the left putamen with associated susceptibility artifact suggestive of remote hemorrhage. There is mild to moderate cerebral atrophy. There is no evidence of acute infarct, mass, midline shift, acute intracranial hemorrhage, or extra-axial fluid collection. Prior bilateral cataract surgery is noted. Mastoid air cells and paranasal sinuses are clear. Major intracranial vascular flow voids are unremarkable.  MRA HEAD FINDINGS  Visualized distal vertebral arteries are patent. Vertebral arteries are codominant. PICA origins are patent. SCAs are patent. Basilar artery is  patent with very minimal narrowing in its midportion. PCA origins and visualized branches are unremarkable. A left posterior communicating artery is identified.  Internal carotid arteries are patent from skullbase to carotid terminus. ACA and MCA origins and visualized branches are patent without evidence of stenosis. An anterior communicating artery is not identified. Wide-mouth aneurysm projecting inferiorly from the left MCA bifurcation measures 5 x 5 x 3 mm.  IMPRESSION: 1. No evidence of acute infarct or other acute intracranial abnormality. 2. Moderate chronic small vessel ischemic disease. 3. 5 mm left MCA bifurcation aneurysm. 4. Mild, smooth narrowing of the mid basilar artery. No evidence of flow limiting intracranial arterial stenosis.   Electronically Signed   By: Logan Bores   On: 09/18/2013 14:50    Microbiology: No results found for this or any previous visit (from the past 240 hour(s)).  Labs: Basic Metabolic Panel:  Recent Labs Lab 09/18/13 1049 09/18/13 1059  NA 144 143  K 4.3 4.0  CL 103 102  CO2 29  --   GLUCOSE 120* 120*  BUN 18 19  CREATININE 1.19* 1.30*  CALCIUM 8.9  --    Liver Function Tests:  Recent Labs Lab 09/18/13 1049  AST 21  ALT 18  ALKPHOS 106  BILITOT 0.6  PROT 6.8  ALBUMIN 3.2*   No results found for this basename: LIPASE, AMYLASE,  in the last 168 hours No results found for this basename: AMMONIA,  in the last 168 hours CBC:  Recent Labs Lab 09/18/13 1049 09/18/13 1059  WBC 9.0  --   NEUTROABS 6.2  --   HGB 13.9 15.3*  HCT 42.5 45.0  MCV 90.8  --   PLT 196  --    Cardiac Enzymes:  Recent Labs Lab 09/18/13 1049  TROPONINI <0.30   BNP: BNP (last 3 results) No results found for this basename: PROBNP,  in the last 8760 hours CBG:  Recent Labs Lab 09/18/13 1046 09/18/13 1715 09/18/13 2038 09/19/13 0641 09/19/13 1119  GLUCAP 109* 120* 125* 127* 104*       Signed:  Marquavius Scaife  [facility] 09/19/2013,  1:36 PM

## 2013-09-19 NOTE — Evaluation (Addendum)
Physical Therapy Evaluation Patient Details Name: Dawn Morrow MRN: [id number] DOB: 04/01/1936 Today's Date: 09/19/2013 Time: 4098-1191 PT Time Calculation (min): 26 min  PT Assessment / Plan / Recommendation History of Present Illness  Dawn Morrow is a 78 y.o. female with h/o asthma, copd, DVT, PE, hypertension came in for generalized weakness, left arm tingling since yesterday. She also reports she felt on the cat litter box. She doesn't remember if she lost conscious ness . She lives by herself in [place] and her family lives in [place]. Family is concerned that she is not safe at home by herself and she had multiple falls in the past.  On arrival to ED, she was evaluate dfor TIA, and a CT head was obtained , it was negative for any acute intracranial findings. It was followed with an MRI OF THE head , which was negative for acute stroke. She is referred to medical service for admission, for further work up for [place].    Clinical Impression  Patient demonstrates deficits in functional mobility as indicated below, Pt will benefit from continued skilled PT to address deficits and maximize function. Will see as indicated and progress activity as tolerated. Rec SNF upon discharge.    PT Assessment  Patient needs continued PT services    Follow Up Recommendations  SNF    Does the patient have the potential to tolerate intense rehabilitation      Barriers to Discharge Decreased caregiver support      Equipment Recommendations  None recommended by PT    Recommendations for Other Services     Frequency Min 2X/week    Precautions / Restrictions Precautions Precautions: Fall Restrictions Weight Bearing Restrictions: No   Pertinent Vitals/Pain No pain at this time      Mobility  Bed Mobility Overal bed mobility: Needs Assistance Bed Mobility: Supine to Sit Supine to sit: Min guard General bed mobility comments: not assessed received in chair Transfers Overall transfer level: Needs  assistance Equipment used: Rolling walker (2 wheeled) Transfers: Sit to/from Stand Sit to Stand: Min guard;Min assist General transfer comment: Assist for stability, max cues for safety Ambulation/Gait Ambulation/Gait assistance: Mod assist Ambulation Distance (Feet): 120 Feet Assistive device: Rolling walker (2 wheeled) Gait Pattern/deviations: Step-to pattern;Decreased stride length;Shuffle;Ataxic;Narrow base of support Gait velocity: decreased Gait velocity interpretation: Below normal speed for age/gender General Gait Details: very unsteady gait with significant safety deficits. Patient required max cues for proper use of assistive device. Pt required multiple self-breaks to rest reporting fatigue but no evident DOE. Patient with inconsistent gait deviations at this time. Will continue to monitor. Modified Rankin (Stroke Patients Only) Pre-Morbid Rankin Score: No significant disability Modified Rankin: Moderately severe disability    Exercises     PT Diagnosis: Difficulty walking;Abnormality of gait  PT Problem List: Decreased strength;Decreased activity tolerance;Decreased balance;Decreased mobility;Decreased safety awareness PT Treatment Interventions: DME instruction;Gait training;Stair training;Functional mobility training;Therapeutic activities;Therapeutic exercise;Balance training;Patient/family education     PT Goals(Current goals can be found in the care plan section) Acute Rehab PT Goals Patient Stated Goal: be able to walk PT Goal Formulation: With patient Time For Goal Achievement: 10/03/13 Potential to Achieve Goals: Fair  Visit Information  Last PT Received On: 09/19/13 Assistance Needed: +1 History of Present Illness: Dawn Morrow is a 78 y.o. female with h/o asthma, copd, DVT, PE, hypertension came in for generalized weakness, left arm tingling since yesterday. She also reports she felt on the cat litter box. She doesn't remember if she lost conscious ness . She  lives by herself in [place] and her family lives in [place]. Family is concerned that she is not safe at home by herself and she had multiple falls in the past.  On arrival to ED, she was evaluate dfor TIA, and a CT head was obtained , it was negative for any acute intracranial findings. It was followed with an MRI OF THE head , which was negative for acute stroke. She is referred to medical service for admission, for further work up for [place].         Prior [place] expects to be discharged to:: Private residence Living Arrangements: Alone Available Help at Discharge: Other (Comment) (reports caregiver comes in 1x/day) Home Access: Stairs to enter Entrance Stairs-Number of Steps: 5 Entrance Stairs-Rails: Left Home Layout: One level Home Equipment: Walker - 4 wheels;Grab bars - tub/shower;Hand held shower head Prior Function Level of Independence: Needs assistance ADL's / Homemaking Assistance Needed: caregiver assisted minimally with bathing and with LB dressing Communication Communication: No difficulties Dominant Hand: Right    Cognition  Cognition Arousal/Alertness: Awake/alert Behavior During Therapy: WFL for tasks assessed/performed Overall Cognitive Status: No family/caregiver present to determine baseline cognitive functioning    Extremity/Trunk Assessment Upper Extremity Assessment Upper Extremity Assessment: Defer to OT evaluation LUE Deficits / Details: 3+/5 shoulder flexion, weak grasp LUE Sensation: decreased light touch LUE Coordination: decreased fine motor Lower Extremity Assessment Lower Extremity Assessment: LLE deficits/detail LLE Deficits / Details: inconsistent testing, fluctuating strength upon resistance, in consistent with functional mobility  LLE Sensation: decreased light touch (per patient) LLE Coordination: decreased fine motor;decreased gross motor   Balance Balance Overall balance assessment: Needs assistance Standing  balance support: Bilateral upper extremity supported;During functional activity Standing balance-Leahy Scale: Fair  End of Session PT - End of Session Equipment Utilized During Treatment: Gait belt Activity Tolerance: Patient tolerated treatment well;Patient limited by fatigue Patient left: in chair;with call bell/phone within reach;with nursing/sitter in room Nurse Communication: Mobility status  GP     2013-09-28 1300  PT G-Codes **NOT FOR INPATIENT CLASS**  Functional Assessment Tool Used clinical judgement  Functional Limitation Mobility: Walking and moving around  Mobility: Walking and Moving Around Current Status (F3545) CK  Mobility: Walking and Moving Around Goal Status [phone]) CI    Duncan Dull September 28, 2013, 1:13 PM Alben Deeds, [place] DPT  [phone]

## 2013-09-19 NOTE — Progress Notes (Signed)
Bilateral carotid artery duplex:  1-39% ICA stenosis.  Vertebral artery flow is antegrade.     

## 2013-09-19 NOTE — Progress Notes (Signed)
Echocardiogram 2D Echocardiogram has been performed.  [name] 09/19/2013, 10:53 AM

## 2013-09-20 DIAGNOSIS — I2699 Other pulmonary embolism without acute cor pulmonale: Secondary | ICD-10-CM

## 2013-09-20 DIAGNOSIS — G459 Transient cerebral ischemic attack, unspecified: Secondary | ICD-10-CM | POA: Diagnosis not present

## 2013-09-20 DIAGNOSIS — I10 Essential (primary) hypertension: Secondary | ICD-10-CM

## 2013-09-20 LAB — GLUCOSE, CAPILLARY
Glucose-Capillary: 139 mg/dL — ABNORMAL HIGH (ref 70–99)
Glucose-Capillary: 132 mg/dL — ABNORMAL HIGH (ref 70–99)
Glucose-Capillary: 87 mg/dL (ref 70–99)
Glucose-Capillary: 97 mg/dL (ref 70–99)

## 2013-09-20 LAB — PROTIME-INR
INR: 2.12 — ABNORMAL HIGH (ref 0.00–1.49)
Prothrombin Time: 23.1 seconds — ABNORMAL HIGH (ref 11.6–15.2)

## 2013-09-20 MED ORDER — WARFARIN SODIUM 4 MG PO TABS
4.0000 mg | ORAL_TABLET | Freq: Once | ORAL | Status: AC
  Administered 2013-09-20: 4 mg via ORAL
  Filled 2013-09-20: qty 1

## 2013-09-20 NOTE — Progress Notes (Signed)
ANTICOAGULATION CONSULT NOTE - Follow Up Consult  Pharmacy Consult:  Coumadin Indication:  History of recurrent DVT and PE  Allergies  Allergen Reactions  . Penicillins Anaphylaxis  . Codeine Other (See Comments)    unknown  . Sulfonamide Derivatives Other (See Comments)    unknown    Patient Measurements: Height: 5\' 5"  (165.1 cm) Weight: 206 lb (93.441 kg) IBW/kg (Calculated) : 57  Vital Signs: Temp: 97.7 F (36.5 C) (01/14 0938) Temp src: Oral (01/14 0938) BP: 137/57 mmHg (01/14 0938) Pulse Rate: 72 (01/14 0938)  Labs:  Recent Labs  09/18/13 1049 09/18/13 1059 09/18/13 2132 09/19/13 0345 09/20/13 0526  HGB 13.9 15.3*  --   --   --   HCT 42.5 45.0  --   --   --   PLT 196  --   --   --   --   APTT 30  --   --   --   --   LABPROT  --   --  21.0* 21.3* 23.1*  INR  --   --  1.87* 1.91* 2.12*  CREATININE 1.19* 1.30*  --   --   --   TROPONINI <0.30  --   --   --   --     Estimated Creatinine Clearance: 42.3 ml/min (by C-G formula based on Cr of 1.3).      Assessment: 72 YOF with history of CVA and history of recurrent PE and DVTs to continue on Coumadin.  INR therapeutic.  No bleeding reported.   Goal of Therapy:  INR 2-3 Monitor platelets by anticoagulation protocol: Yes    Plan:  - Coumadin 4mg  PO today.  Could discharge on Coumadin 3mg  PO daily.  INR check this week. - Daily PT / INR    Kadelyn Dimascio D. Mina Marble, PharmD, BCPS Pager:  [phone] 09/20/2013, 12:35 PM

## 2013-09-20 NOTE — ED Provider Notes (Signed)
I saw and evaluated the patient, reviewed the resident's note and I agree with the findings and plan.  EKG Interpretation    Date/Time:  Monday September 18 2013 09:51:53 EST Ventricular Rate:  66 PR Interval:  67 QRS Duration: 93 QT Interval:  408 QTC Calculation: 427 R Axis:   -38 Text Interpretation:  Sinus rhythm Multiform ventricular premature complexes Short PR interval Inferior infarct, old  Similar to prior Confirmed by [facility]  MD, [place] [phone]) on 09/18/2013 9:58:50 AM            Patient here with acute on chronic weakness. No CP, no SOB. No fever. No flu-like symptoms. Also having headache. Workup without ICH on Head CT, labs ok. Concern for possible CVA and inability to ambulate. Admitted to medicine.  Osvaldo Shipper, MD 09/20/13 [phone]

## 2013-09-20 NOTE — Progress Notes (Signed)
[facility] PROGRESS NOTE  Dawn Morrow ZDG:[id number] DOB: Jan 17, 1936 DOA: 09/18/2013 PCP: Dawn November, MD  Assessment/Plan: #1 left upper extremity tingling and numbness/probable TIA Clinical improvement. MRI of the head does not show an acute stroke. MRA shows a 3.5 mm left MCA bifurcation aneurysm. 2-D echo with no source of emboli. Carotid Dopplers with no significant ICA stenosis. LDL is 79. TSH is 3.05. Patient has been resumed back on her Coumadin which is currently therapeutic. Will need to followup with neurology as outpatient.  #2 history of DVT/PE Continue Coumadin. INR is therapeutic.  #3 accelerated hypertension Improved. Continue Coreg, lisinopril. Outpatient follow up.  #4 hypothyroidism Continue Synthroid.  #5 hyperlipidemia LDL is 79. Continue Lipitor and Zetia.  #6 type 2 diabetes Hemoglobin A1c 6.6. Continue sliding scale insulin.  #7 prophylaxis On Coumadin.  Code Status: full Family Communication: updated patient, son, daughter at bedside. Disposition Plan: skilled nursing facility tomorrow   Consultants:  none  Procedures:  CT head 09/18/2013  MRI/MRA head 09/18/2013  2-D echo 09/19/2013  Carotid Dopplers 09/19/2013  Antibiotics:  none  HPI/Subjective: Patient states left upper extremity numbness improving.  Objective: Filed Vitals:   09/20/13 1331  BP: 149/55  Pulse: 66  Temp: 97.7 F (36.5 C)  Resp: 20    Intake/Output Summary (Last 24 hours) at 09/20/13 1843 Last data filed at 09/20/13 1331  Gross per 24 hour  Intake    480 ml  Output      0 ml  Net    480 ml   Filed Weights   09/18/13 0955 09/18/13 1609  Weight: 92.987 kg (205 lb) 93.441 kg (206 lb)    Exam:   General:  NAD  Cardiovascular: RRR  Respiratory: CTAB   Abdomen: soft, nontender, nondistended, positive bowel sounds.  Musculoskeletal: no clubbing cyanosis or edema  Data Reviewed: Basic Metabolic Panel:  Recent Labs Lab 09/18/13 1049  09/18/13 1059  NA 144 143  K 4.3 4.0  CL 103 102  CO2 29  --   GLUCOSE 120* 120*  BUN 18 19  CREATININE 1.19* 1.30*  CALCIUM 8.9  --    Liver Function Tests:  Recent Labs Lab 09/18/13 1049  AST 21  ALT 18  ALKPHOS 106  BILITOT 0.6  PROT 6.8  ALBUMIN 3.2*   No results found for this basename: LIPASE, AMYLASE,  in the last 168 hours No results found for this basename: AMMONIA,  in the last 168 hours CBC:  Recent Labs Lab 09/18/13 1049 09/18/13 1059  WBC 9.0  --   NEUTROABS 6.2  --   HGB 13.9 15.3*  HCT 42.5 45.0  MCV 90.8  --   PLT 196  --    Cardiac Enzymes:  Recent Labs Lab 09/18/13 1049  TROPONINI <0.30   BNP (last 3 results) No results found for this basename: PROBNP,  in the last 8760 hours CBG:  Recent Labs Lab 09/19/13 1706 09/19/13 2024 09/20/13 0619 09/20/13 1128 09/20/13 1638  GLUCAP 121* 109* 139* 132* 87    No results found for this or any previous visit (from the past 240 hour(s)).   Studies: No results found.  Scheduled Meds: . aspirin  81 mg Oral Daily  . atorvastatin  40 mg Oral q1800  . buPROPion  300 mg Oral Daily  . carvedilol  12.5 mg Oral Daily  . ezetimibe  10 mg Oral Daily  . fluticasone  1 puff Inhalation BID  . insulin aspart  0-5 Units Subcutaneous QHS  .  insulin aspart  0-9 Units Subcutaneous TID WC  . levothyroxine  150 mcg Oral Daily  . lisinopril  10 mg Oral Daily  . meclizine  25 mg Oral QHS  . warfarin  4 mg Oral ONCE-1800  . Warfarin - Pharmacist Dosing Inpatient   Does not apply q1800   Continuous Infusions:   Principal Problem:   TIA (transient ischemic attack) Active Problems:   HYPOTHYROIDISM   DIABETES MELLITUS, TYPE II   HYPERLIPIDEMIA   HYPERTENSION   COPD   GERD   PE (pulmonary embolism)   DVT (deep venous thrombosis)   Weakness    Time spent: 78 minutes    Dawn Morrow M.D. [facility] Pager [phone]. If 7PM-7AM, please contact night-coverage at [email or web address],  password [facility] 09/20/2013, 6:43 PM  LOS: 2 days

## 2013-09-20 NOTE — Progress Notes (Signed)
Talked to patient about discharge planning. Patient is Observational status/ code 59 document given to patient; patient stated that she wants to go home hame with home health care services. Patient's niece [name] called and updated. Home health care choices offered, [name] chose [facility]; Debbie with [facility] called for arrangements; Attending MD at discharge please order HHRN/ PT/OT/SW; Aneta Mins [phone]

## 2013-09-20 NOTE — Clinical Social Work Note (Signed)
CSW met with pt at bedside to discuss how pt is not eligible for SNF placement due to pt being an observation pt and not inpatient. Pt stated that she understood, and would like to return home once medically stable for discharge. CSW and pt discussed possible ALF placement, pt stated that she would like to return home before going to ALF. (Later pt stated that since she will be working with PT at home, she was no longer interested in ALF at this time). CSW consulted with RNCM regarding information above.   Pt to be discharged home. Please re consult if needed.  Pati Gallo, [place] Social Worker [phone]

## 2013-09-21 DIAGNOSIS — G459 Transient cerebral ischemic attack, unspecified: Secondary | ICD-10-CM | POA: Diagnosis not present

## 2013-09-21 LAB — GLUCOSE, CAPILLARY
Glucose-Capillary: 97 mg/dL (ref 70–99)
Glucose-Capillary: 137 mg/dL — ABNORMAL HIGH (ref 70–99)

## 2013-09-21 LAB — BASIC METABOLIC PANEL
BUN: 27 mg/dL — ABNORMAL HIGH (ref 6–23)
CO2: 29 mEq/L (ref 19–32)
Calcium: 8.4 mg/dL (ref 8.4–10.5)
Chloride: 104 mEq/L (ref 96–112)
Creatinine, Ser: 1.51 mg/dL — ABNORMAL HIGH (ref 0.50–1.10)
GFR calc Af Amer: 38 mL/min — ABNORMAL LOW (ref >90)
GFR calc non Af Amer: 33 mL/min — ABNORMAL LOW (ref >90)
Glucose, Bld: 101 mg/dL — ABNORMAL HIGH (ref 70–99)
Potassium: 4.6 mEq/L (ref 3.7–5.3)
Sodium: 144 mEq/L (ref 137–147)

## 2013-09-21 LAB — PROTIME-INR
INR: 2.43 — ABNORMAL HIGH (ref 0.00–1.49)
Prothrombin Time: 25.6 seconds — ABNORMAL HIGH (ref 11.6–15.2)

## 2013-09-21 MED ORDER — ASPIRIN 81 MG PO CHEW: 81.0000 mg | CHEWABLE_TABLET | Freq: Every day | ORAL | Status: DC

## 2013-09-21 MED ORDER — SODIUM CHLORIDE 0.9 % IV SOLN
INTRAVENOUS | Status: DC
  Administered 2013-09-21: 10:00:00 via INTRAVENOUS

## 2013-09-21 NOTE — Progress Notes (Signed)
Physical Therapy Treatment Patient Details Name: Dawn Morrow MRN: [id number] DOB: [birth year] Today's Date: 09/21/2013 Time: 0935-1000 PT Time Calculation (min): 25 min  PT Assessment / Plan / Recommendation  History of Present Illness Dawn Morrow is a 78 y.o. female with h/o asthma, copd, DVT, PE, hypertension came in for generalized weakness, left arm tingling since yesterday. She also reports she felt on the cat litter box. She doesn't remember if she lost conscious ness . She lives by herself in [place] and her family lives in [place]. Family is concerned that she is not safe at home by herself and she had multiple falls in the past.  On arrival to ED, she was evaluate dfor TIA, and a CT head was obtained , it was negative for any acute intracranial findings. It was followed with an MRI OF THE head , which was negative for acute stroke. She is referred to medical service for admission, for further work up for [place].     PT Comments   Patient agreeable to ambulation. Patient with significant decreased in safety awareness throughout session requiring max cueing for safety. Patient awaiting SNF at this time.   Follow Up Recommendations  SNF     Does the patient have the potential to tolerate intense rehabilitation     Barriers to Discharge        Equipment Recommendations  None recommended by PT    Recommendations for Other Services    Frequency Min 2X/week   Progress towards PT Goals Progress towards PT goals: Progressing toward goals  Plan Current plan remains appropriate    Precautions / Restrictions     Pertinent Vitals/Pain Denied pain    Mobility  Bed Mobility Supine to sit: Supervision Transfers Overall transfer level: Needs assistance Equipment used: Rolling walker (2 wheeled) Sit to Stand: Min guard;Min assist General transfer comment: Assist for stability and cues for technique and being fully backed up to recliner prior to sitting Ambulation/Gait Ambulation/Gait  assistance: Min assist Ambulation Distance (Feet): 150 Feet Assistive device: Rolling walker (2 wheeled) Gait Pattern/deviations: Step-through pattern;Decreased stride length;Narrow base of support Gait velocity: varying gait speeds throughout session General Gait Details: very unsteady gait with significant safety deficits. Patient required max cues for proper use of assistive device. Pt required multiple self-breaks to rest reporting fatigue but no evident DOE. Patient with inconsistent gait deviations at this time. Will continue to monitor.    Exercises     PT Diagnosis:    PT Problem List:   PT Treatment Interventions:     PT Goals (current goals can now be found in the care plan section)    Visit Information  Last PT Received On: 09/21/13 Assistance Needed: +1 History of Present Illness: Dawn Morrow is a 78 y.o. female with h/o asthma, copd, DVT, PE, hypertension came in for generalized weakness, left arm tingling since yesterday. She also reports she felt on the cat litter box. She doesn't remember if she lost conscious ness . She lives by herself in [place] and her family lives in [place]. Family is concerned that she is not safe at home by herself and she had multiple falls in the past.  On arrival to ED, she was evaluate dfor TIA, and a CT head was obtained , it was negative for any acute intracranial findings. It was followed with an MRI OF THE head , which was negative for acute stroke. She is referred to medical service for admission, for further work up for [place].  Subjective Data      Cognition  Cognition Arousal/Alertness: Awake/alert Behavior During Therapy: WFL for tasks assessed/performed Overall Cognitive Status: Impaired/Different from baseline Area of Impairment: Safety/judgement Safety/Judgement: Decreased awareness of safety    Balance  Balance Standing balance-Leahy Scale: Fair  End of Session PT - End of Session Equipment Utilized During Treatment:  Gait belt Activity Tolerance: Patient tolerated treatment well;Patient limited by fatigue Patient left: in chair;with call bell/phone within reach;with chair alarm set Nurse Communication: Mobility status   GP     Jacqualyn Posey 09/21/2013, 10:04 AM 09/21/2013 Jacqualyn Posey PTA [phone] pager [phone] office

## 2013-09-21 NOTE — Progress Notes (Signed)
Discharge order received. Patient to be transported to [facility] SN[facility] via family. CSW packet given to patients son. Discharge instructions, medications, and follow ups reviewed with patient and family and they acknowledged understanding. Stroke/TIA education completed.

## 2013-09-21 NOTE — Clinical Social Work Note (Signed)
Pt and pt's family would like for pt to be discharged to SNF versus home with home health. Pt and pt's family agreed on SNF placement at [facility] for SNF placement. MD has completed discharge paperwork. CSW has sent discharge paperwork to [facility] and received approval for admission to their facility today (09/21/2013). CSW has completed discharge packet and placed on pt's shadow chart. Pt and pt's family requested pt be transported to [facility] by family vehicle. CSW confirmed with MD that pt may be transported via family vehicle. CSW informed RN of information above.  Pt and pt's family to take discharge packet on pt's shadow chart with them to [facility] once they discharge.  RN to call report to [facility]: [place], [place] Worker [phone]

## 2013-09-21 NOTE — Progress Notes (Signed)
ANTICOAGULATION CONSULT NOTE - Follow Up Consult  Pharmacy Consult:  Coumadin Indication:  History of recurrent DVT and PE  Allergies  Allergen Reactions  . Penicillins Anaphylaxis  . Codeine Other (See Comments)    unknown  . Sulfonamide Derivatives Other (See Comments)    unknown    Patient Measurements: Height: 5\' 5"  (165.1 cm) Weight: 206 lb (93.441 kg) IBW/kg (Calculated) : 57  Vital Signs: Temp: 97.4 F (36.3 C) (01/15 1015) Temp src: Oral (01/15 1015) BP: 143/65 mmHg (01/15 1015) Pulse Rate: 63 (01/15 1015)  Labs:  Recent Labs  09/19/13 0345 09/20/13 0526 09/21/13 0330  LABPROT 21.3* 23.1* 25.6*  INR 1.91* 2.12* 2.43*  CREATININE  --   --  1.51*    Estimated Creatinine Clearance: 36.4 ml/min (by C-G formula based on Cr of 1.51).      Assessment: 89 YOF with history of CVA and history of recurrent PE and DVTs to continue on Coumadin.  INR therapeutic.  No bleeding reported.  Noted discharge planning.   Goal of Therapy:  INR 2-3 Monitor platelets by anticoagulation protocol: Yes    Plan:  - Could discharge on Coumadin 3mg  PO daily.  INR check this week. - PT / INR in AM if still here     Mariana Goytia D. Mina Marble, PharmD, BCPS Pager:  [phone] 09/21/2013, 2:12 PM

## 2013-09-21 NOTE — Discharge Summary (Signed)
Physician Discharge Summary  Sharlene Mccluskey BJS:[id number] DOB: 1936/02/04 DOA: 09/18/2013  PCP: Kathlene November, MD  Admit date: 09/18/2013 Discharge date: 09/21/2013  Time spent: 65 minutes  Recommendations for Outpatient Follow-up:  1. Follow up with neurology in 1 month. 2. Follow up with Kathlene November, MD in 1 week. On followup a basic metabolic profile need to be obtained to followup on patient's electrolytes and renal function. Patient is numbness into her left upper extremity need to be reassessed as was felt patient likely had a TIA.  Discharge Diagnoses:  Principal Problem:   TIA (transient ischemic attack) Active Problems:   HYPOTHYROIDISM   DIABETES MELLITUS, TYPE II   HYPERLIPIDEMIA   HYPERTENSION   COPD   GERD   PE (pulmonary embolism)   DVT (deep venous thrombosis)   Weakness   Discharge Condition: Stable and improved  Diet recommendation: Heart healthy  Filed Weights   09/18/13 0955 09/18/13 1609  Weight: 92.987 kg (205 lb) 93.441 kg (206 lb)    History of present illness:  Dawn Morrow is a 78 y.o. female with h/o asthma, copd, DVT, PE, hypertension came in for generalized weakness, left arm tingling since yesterday. She also reports she felt on the cat litter box. She doesn't remember if she lost conscious ness . She lives by herself in [place] and her family lives in [place]. Family is concerned that she is not safe at home by herself and she had multiple falls in the past. On arrival to ED, she was evaluate dfor TIA, and a CT head was obtained , it was negative for any acute intracranial findings. It was followed with an MRI OF THE head , which was negative for acute stroke. She is referred to medical service for admission, for further work up for [place].    Hospital Course:  #1 left upper extremity tingling and numbness/probable TIA  Patient was admitted with left upper extremity numbness and tingling as well as generalized weakness. Patient on admission was noted to have an  INR of 1.87 and was on chronic anticoagulation for DVT/PE. Patient was admitted for stroke/TIA workup. Head CT which was done was negative for any acute abnormality however did show old stroke. MRI of the head does not show an acute stroke. MRA shows a 3.5 mm left MCA bifurcation aneurysm. 2-D echo with no source of emboli. Carotid Dopplers with no significant ICA stenosis. LDL is 79. TSH is 3.05. Patient has been resumed back on her Coumadin which is currently therapeutic. Patient improved clinically and will be discharged to a skilled nursing facility in stable and improved condition. Patient will followup with neurology as outpatient for further evaluation and management.  #2 history of DVT/PE  Patient noted to be on chronic anticoagulation secondary to history of DVT/PE. On admission patient's INR was subtherapeutic. Patient's Coumadin was resumed and INR became therapeutic. Patient will followup as outpatient. #3 accelerated hypertension  During the hospitalization was noted to be hypertensive. Patient was started back on Coreg and lisinopril with better blood pressure improvement. Patient will followup with PCP as outpatient.  #4 hypothyroidism  Continued on Synthroid.  #5 hyperlipidemia  LDL is 79. Continued on Lipitor and Zetia.  #6 type 2 diabetes  Hemoglobin A1c 6.6. Continue sliding scale insulin. Patient will need outpatient followup.     Procedures: CT head 09/18/2013  MRI/MRA head 09/18/2013  2-D echo 09/19/2013  Carotid Dopplers 09/19/2013     Consultations:  None  Discharge Exam: Filed Vitals:   09/21/13 1015  BP: 143/65  Pulse: 63  Temp: 97.4 F (36.3 C)  Resp: 20    General: nad Cardiovascular: rrr Respiratory: ctab  Discharge Instructions      Discharge Orders   Future Orders Complete By Expires   Diet - low sodium heart healthy  As directed    Discharge instructions  As directed    Comments:     Follow up with neurology in 1 month. Patient needs  PT/INR check on Friday 09/22/13.   Increase activity slowly  As directed        Medication List         albuterol 108 (90 BASE) MCG/ACT inhaler  Commonly known as:  VENTOLIN HFA  Inhale 2 puffs into the lungs every 4 (four) hours as needed.     aspirin 81 MG chewable tablet  Chew 1 tablet (81 mg total) by mouth daily.     aspirin-acetaminophen-caffeine 379-024-09 MG per tablet  Commonly known as:  EXCEDRIN MIGRAINE  Take 2 tablets by mouth every 6 (six) hours as needed for headache.     atorvastatin 40 MG tablet  Commonly known as:  LIPITOR  Take 40 mg by mouth daily.     buPROPion 300 MG 24 hr tablet  Commonly known as:  WELLBUTRIN XL  Take 300 mg by mouth daily.     carvedilol 12.5 MG tablet  Commonly known as:  COREG  Take 1 tablet (12.5 mg total) by mouth daily.     ezetimibe 10 MG tablet  Commonly known as:  ZETIA  Take 10 mg by mouth daily.     levothyroxine 150 MCG tablet  Commonly known as:  SYNTHROID, LEVOTHROID  Take 150 mcg by mouth daily.     lisinopril 10 MG tablet  Commonly known as:  PRINIVIL,ZESTRIL  Take 1 tablet (10 mg total) by mouth daily.     meclizine 25 MG tablet  Commonly known as:  ANTIVERT  Take 1 tablet (25 mg total) by mouth at bedtime.     warfarin 6 MG tablet  Commonly known as:  COUMADIN  Take 3 mg by mouth See admin instructions. Takes 3mg  daily every day of week except for Saturday-takes nothing       Allergies  Allergen Reactions  . Penicillins Anaphylaxis  . Codeine Other (See Comments)    unknown  . Sulfonamide Derivatives Other (See Comments)    unknown   Follow-up Information   Follow up with [facility]. Schedule an appointment as soon as possible for a visit in 1 month.   Specialty:  Neurology   Contact information:   [address] [place], [place] [address] [phone]      Follow up with Kathlene November, MD. Schedule an appointment as soon as possible for a visit in 1 week.    Specialty:  Internal Medicine   Contact information:   [phone] [facility] [address] [place] [address] [phone]        The results of significant diagnostics from this hospitalization (including imaging, microbiology, ancillary and laboratory) are listed below for reference.    Significant Diagnostic Studies: Dg Chest 2 View  09/18/2013   CLINICAL DATA:  Shortness of breath, cough  EXAM: CHEST  2 VIEW  COMPARISON:  Prior radiograph from 03/24/2007  FINDINGS: The cardiac and mediastinal silhouettes are stable in size and contour, and remain within normal limits.  The lungs are normally inflated. No airspace consolidation, pleural effusion, or pulmonary edema is  identified. There is no pneumothorax.  No acute osseous abnormality identified. Moderate multilevel degenerative disc disease noted within the visualized spine. IVC filter overlies the upper abdomen.  IMPRESSION: No active cardiopulmonary disease.   Electronically Signed   By: Jeannine Boga M.D.   On: 09/18/2013 14:31   Ct Head Wo Contrast  09/18/2013   CLINICAL DATA:  Fatigue/altered mental status  EXAM: CT HEAD WITHOUT CONTRAST  TECHNIQUE: Contiguous axial images were obtained from the base of the skull through the vertex without intravenous contrast. Study was obtained within 24 hr of patient's arrival at the emergency department.  COMPARISON:  Brain CT April 09, 2010 and brain MRI April 09, 2010  FINDINGS: There is age related volume loss. There is no mass, hemorrhage, extra-axial fluid collection, or midline shift. There is evidence of a prior lacunar infarct in the left putaminal. . There is patchy small vessel disease throughout the centra semiovale bilaterally. There is evidence of a prior small infarct in the left putamen, stable. There is no demonstrable acute infarct. Bony calvarium appears intact. The mastoid air cells are clear.  IMPRESSION: Age related volume loss with moderate small vessel disease in  the periventricular white matter, stable. Prior small infarct in left putamen. There is no demonstrable mass, hemorrhage, or acute appearing infarct.   Electronically Signed   By: Lowella Grip M.D.   On: 09/18/2013 11:08   Mr Brain Wo Contrast  09/18/2013   CLINICAL DATA:  Fatigue and hypertension.  Evaluate for stroke.  EXAM: MRI HEAD WITHOUT CONTRAST  MRA HEAD WITHOUT CONTRAST  TECHNIQUE: Multiplanar, multiecho pulse sequences of the brain and surrounding structures were obtained without intravenous contrast. Angiographic images of the head were obtained using MRA technique without contrast.  COMPARISON:  Head CT 09/18/2013 and brain MRI 04/09/2010  FINDINGS: MRI HEAD FINDINGS  Scattered and confluent regions of T2 hyperintensity within the periventricular and subcortical regions are stable to mildly progressed from the prior exam and are compatible with moderate chronic small vessel ischemic disease. Remote lacunar infarct is noted in the left putamen with associated susceptibility artifact suggestive of remote hemorrhage. There is mild to moderate cerebral atrophy. There is no evidence of acute infarct, mass, midline shift, acute intracranial hemorrhage, or extra-axial fluid collection. Prior bilateral cataract surgery is noted. Mastoid air cells and paranasal sinuses are clear. Major intracranial vascular flow voids are unremarkable.  MRA HEAD FINDINGS  Visualized distal vertebral arteries are patent. Vertebral arteries are codominant. PICA origins are patent. SCAs are patent. Basilar artery is patent with very minimal narrowing in its midportion. PCA origins and visualized branches are unremarkable. A left posterior communicating artery is identified.  Internal carotid arteries are patent from skullbase to carotid terminus. ACA and MCA origins and visualized branches are patent without evidence of stenosis. An anterior communicating artery is not identified. Wide-mouth aneurysm projecting inferiorly  from the left MCA bifurcation measures 5 x 5 x 3 mm.  IMPRESSION: 1. No evidence of acute infarct or other acute intracranial abnormality. 2. Moderate chronic small vessel ischemic disease. 3. 5 mm left MCA bifurcation aneurysm. 4. Mild, smooth narrowing of the mid basilar artery. No evidence of flow limiting intracranial arterial stenosis.   Electronically Signed   By: Logan Bores   On: 09/18/2013 14:50   Mr Jodene Nam Head/brain Wo Cm  09/18/2013   CLINICAL DATA:  Fatigue and hypertension.  Evaluate for stroke.  EXAM: MRI HEAD WITHOUT CONTRAST  MRA HEAD WITHOUT CONTRAST  TECHNIQUE: Multiplanar, multiecho pulse  sequences of the brain and surrounding structures were obtained without intravenous contrast. Angiographic images of the head were obtained using MRA technique without contrast.  COMPARISON:  Head CT 09/18/2013 and brain MRI 04/09/2010  FINDINGS: MRI HEAD FINDINGS  Scattered and confluent regions of T2 hyperintensity within the periventricular and subcortical regions are stable to mildly progressed from the prior exam and are compatible with moderate chronic small vessel ischemic disease. Remote lacunar infarct is noted in the left putamen with associated susceptibility artifact suggestive of remote hemorrhage. There is mild to moderate cerebral atrophy. There is no evidence of acute infarct, mass, midline shift, acute intracranial hemorrhage, or extra-axial fluid collection. Prior bilateral cataract surgery is noted. Mastoid air cells and paranasal sinuses are clear. Major intracranial vascular flow voids are unremarkable.  MRA HEAD FINDINGS  Visualized distal vertebral arteries are patent. Vertebral arteries are codominant. PICA origins are patent. SCAs are patent. Basilar artery is patent with very minimal narrowing in its midportion. PCA origins and visualized branches are unremarkable. A left posterior communicating artery is identified.  Internal carotid arteries are patent from skullbase to carotid  terminus. ACA and MCA origins and visualized branches are patent without evidence of stenosis. An anterior communicating artery is not identified. Wide-mouth aneurysm projecting inferiorly from the left MCA bifurcation measures 5 x 5 x 3 mm.  IMPRESSION: 1. No evidence of acute infarct or other acute intracranial abnormality. 2. Moderate chronic small vessel ischemic disease. 3. 5 mm left MCA bifurcation aneurysm. 4. Mild, smooth narrowing of the mid basilar artery. No evidence of flow limiting intracranial arterial stenosis.   Electronically Signed   By: Logan Bores   On: 09/18/2013 14:50    Microbiology: No results found for this or any previous visit (from the past 240 hour(s)).   Labs: Basic Metabolic Panel:  Recent Labs Lab 09/18/13 1049 09/18/13 1059 09/21/13 0330  NA 144 143 144  K 4.3 4.0 4.6  CL 103 102 104  CO2 29  --  29  GLUCOSE 120* 120* 101*  BUN 18 19 27*  CREATININE 1.19* 1.30* 1.51*  CALCIUM 8.9  --  8.4   Liver Function Tests:  Recent Labs Lab 09/18/13 1049  AST 21  ALT 18  ALKPHOS 106  BILITOT 0.6  PROT 6.8  ALBUMIN 3.2*   No results found for this basename: LIPASE, AMYLASE,  in the last 168 hours No results found for this basename: AMMONIA,  in the last 168 hours CBC:  Recent Labs Lab 09/18/13 1049 09/18/13 1059  WBC 9.0  --   NEUTROABS 6.2  --   HGB 13.9 15.3*  HCT 42.5 45.0  MCV 90.8  --   PLT 196  --    Cardiac Enzymes:  Recent Labs Lab 09/18/13 1049  TROPONINI <0.30   BNP: BNP (last 3 results) No results found for this basename: PROBNP,  in the last 8760 hours CBG:  Recent Labs Lab 09/20/13 0619 09/20/13 1128 09/20/13 1638 09/20/13 2039 09/21/13 0652  GLUCAP 139* 132* 87 97 97       Signed:  Wanya Bangura MD [facility] 09/21/2013, 11:31 AM

## 2013-09-22 ENCOUNTER — Non-Acute Institutional Stay (SKILLED_NURSING_FACILITY): Payer: Medicare Other | Admitting: Internal Medicine

## 2013-09-22 ENCOUNTER — Encounter: Payer: Self-pay | Admitting: Internal Medicine

## 2013-09-22 DIAGNOSIS — Z7901 Long term (current) use of anticoagulants: Secondary | ICD-10-CM

## 2013-09-22 DIAGNOSIS — E119 Type 2 diabetes mellitus without complications: Secondary | ICD-10-CM

## 2013-09-22 DIAGNOSIS — E785 Hyperlipidemia, unspecified: Secondary | ICD-10-CM

## 2013-09-22 DIAGNOSIS — I10 Essential (primary) hypertension: Secondary | ICD-10-CM

## 2013-09-22 DIAGNOSIS — I2699 Other pulmonary embolism without acute cor pulmonale: Secondary | ICD-10-CM

## 2013-09-22 DIAGNOSIS — I82409 Acute embolism and thrombosis of unspecified deep veins of unspecified lower extremity: Secondary | ICD-10-CM

## 2013-09-22 DIAGNOSIS — G459 Transient cerebral ischemic attack, unspecified: Secondary | ICD-10-CM

## 2013-09-22 DIAGNOSIS — E039 Hypothyroidism, unspecified: Secondary | ICD-10-CM

## 2013-09-22 NOTE — Assessment & Plan Note (Addendum)
Pt on zetia 10 mg and lipitor; LDL - 79, HDL- 44; well controlled

## 2013-09-22 NOTE — Assessment & Plan Note (Signed)
On chronic prophylaxis for PE and DVT; on admission was not theraputic but was reported as theraputic on d/c on 3 mg daily except for Saturday

## 2013-09-22 NOTE — Progress Notes (Signed)
MRN: [id number] Name: Dawn Morrow  Sex: female Age: 78 y.o. DOB: 04/18/1936  [place] #: Karren Burly Facility/Room: 122 Level Of Care: SNF Provider: Inocencio Homes D Emergency Contacts: Extended Emergency Contact Information Primary Emergency Contact: [place] Phone: [phone] Relation: Son Secondary Emergency Contact: Blankenship,[name] Address: [place],  [address] [place] Phone: [phone] Mobile Phone: [phone] Relation: Niece  Code Status:   Allergies: Penicillins; Codeine; and Sulfonamide derivatives  Chief Complaint  Patient presents with  . nursing home admission    HPI: Patient is 78 y.o. female who was living at home by herself and had a TIA and admitted to SNF because she is not safe at home by herself and has had multiple falls.  Past Medical History  Diagnosis Date  . Asthma   . COPD (chronic obstructive pulmonary disease)   . DVT (deep vein thrombosis) in pregnancy     hx x multiple on coumadin  . Pulmonary embolism     x 2  . GERD (gastroesophageal reflux disease)   . Hypertension   . Osteopenia   . Hypothyroidism   . Hyperlipidemia   . Osteoarthritis     h/o spinal stenosis by MRI 2009  . Back pain   . S/P lumbar fusion   . Hiatal hernia   . Diverticulosis   . Hemorrhoids   . Tubular adenoma of colon 07/2005  . Anxiety and depression   . Anxiety   . Cataract   . Depression   . Stroke     MINI  . Diabetes mellitus     Past Surgical History  Procedure Laterality Date  . Appendectomy    . Cholecystectomy    . Tubal ligation    . Tonsillectomy    . Lumbar fusion    . Cataract extraction    . Colonoscopy    . Polypectomy        Medication List       This list is accurate as of: 09/22/13 11:59 PM.  Always use your most recent med list.               albuterol 108 (90 BASE) MCG/ACT inhaler  Commonly known as:  VENTOLIN HFA  Inhale  2 puffs into the lungs every 4 (four) hours as needed.     aspirin 81 MG chewable tablet  Chew 1 tablet (81 mg total) by mouth daily.     aspirin-acetaminophen-caffeine 409-735-32 MG per tablet  Commonly known as:  EXCEDRIN MIGRAINE  Take 2 tablets by mouth every 6 (six) hours as needed for headache.     atorvastatin 40 MG tablet  Commonly known as:  LIPITOR  Take 40 mg by mouth daily.     buPROPion 300 MG 24 hr tablet  Commonly known as:  WELLBUTRIN XL  Take 300 mg by mouth daily.     carvedilol 12.5 MG tablet  Commonly known as:  COREG  Take 1 tablet (12.5 mg total) by mouth daily.     ezetimibe 10 MG tablet  Commonly known as:  ZETIA  Take 10 mg by mouth daily.     levothyroxine 150 MCG tablet  Commonly known as:  SYNTHROID, LEVOTHROID  Take 150 mcg by mouth daily.     lisinopril 10 MG tablet  Commonly known as:  PRINIVIL,ZESTRIL  Take 1 tablet (10 mg total) by mouth daily.     meclizine  25 MG tablet  Commonly known as:  ANTIVERT  Take 1 tablet (25 mg total) by mouth at bedtime.     warfarin 6 MG tablet  Commonly known as:  COUMADIN  Take 3 mg by mouth See admin instructions. Takes 3mg  daily every day of week except for Saturday-takes nothing        No orders of the defined types were placed in this encounter.    Immunization History  Administered Date(s) Administered  . Influenza Split 09/28/2011, 05/30/2012  . Influenza Whole 07/08/2004, 06/29/2007, 05/30/2008, 06/04/2009, 06/06/2010  . Influenza, High Dose Seasonal PF 07/24/2013  . Pneumococcal Polysaccharide-23 07/12/2004, 02/27/2009  . Td 09/13/2007  . Zoster 06/08/2011    History  Substance Use Topics  . Smoking status: Former Smoker    Types: Cigarettes  . Smokeless tobacco: Never Used  . Alcohol Use: No     Comment: socially    Family history is noncontributory    Review of Systems  DATA OBTAINED: from patient,  GENERAL: Feels well no fevers, fatigue, appetite changes SKIN: No  itching, rash or wounds EYES: No eye pain, redness, discharge EARS: No earache, tinnitus, change in hearing NOSE: No congestion, drainage or bleeding  MOUTH/THROAT: No mouth or tooth pain, No sore throat,  RESPIRATORY: No cough, wheezing, SOB CARDIAC: No chest pain, palpitations, lower extremity edema  GI: No abdominal pain, No N/V/D or constipation, No heartburn or reflux  GU: No dysuria, frequency or urgency, or incontinence  MUSCULOSKELETAL: No unrelieved bone/joint pain NEUROLOGIC: No headache, dizziness or focal weakness PSYCHIATRIC: No overt anxiety or sadness. Sleeps well. No behavior issue.   Filed Vitals:   09/22/13 1123  BP: 140/69  Pulse: 65  Temp: 97.9 F (36.6 C)  Resp: 20    Physical Exam  GENERAL APPEARANCE: Alert, conversant. Appropriately groomed. No acute distress.  SKIN: No diaphoresis rash, or wounds HEAD: Normocephalic, atraumatic  EYES: Conjunctiva/lids clear. Pupils round, reactive. EOMs intact.  EARS: External exam WNL, canals clear. Hearing grossly normal.  NOSE: No deformity or discharge.  MOUTH/THROAT: Lips w/o lesions.  RESPIRATORY: Breathing is even, unlabored. Lung sounds are clear   CARDIOVASCULAR: Heart RRR no murmurs, rubs or gallops. No peripheral edema.  GASTROINTESTINAL: Abdomen is soft, non-tender, not distended w/ normal bowel sounds. GENITOURINARY: Bladder non tender, not distended  MUSCULOSKELETAL: No abnormal joints or musculature NEUROLOGIC: Oriented X2. Cranial nerves 2-12 grossly intact. Moves all extremities no tremor. PSYCHIATRIC:pleasant , a little forgetfull, no behavioral issues  Patient Active Problem List   Diagnosis Date Noted  . TIA (transient ischemic attack) 09/19/2013  . Weakness 09/18/2013  . Annual physical exam 05/30/2012  . Tremor 01/21/2012  . Failure to thrive and poor med compliance  01/21/2012  . PE (pulmonary embolism) 11/18/2010  . DVT (deep venous thrombosis) 11/18/2010  . Long term current use of  anticoagulant 11/18/2010  . ABNORMAL ELECTROCARDIOGRAM 11/10/2010  . OSTEOARTHRITIS 08/06/2010  . DIZZINESS 04/15/2010  . DIABETES MELLITUS, TYPE II 06/04/2009  . ACNE ROSACEA 11/28/2008  . BACK PAIN 05/16/2008  . HIP PAIN, RIGHT, CHRONIC 09/15/2007  . HYPOTHYROIDISM 09/13/2007  . HYPERLIPIDEMIA 09/13/2007  . Osteoporosis 07/22/2007  . DEPRESSION 09/11/2006  . HYPERTENSION 09/11/2006  . ASTHMA 09/11/2006  . COPD 09/11/2006  . GERD 09/11/2006  . UTI'S, RECURRENT 09/11/2006  . GREENFIELD FILTER INSERTION, HX OF 09/11/2006    CBC    Component Value Date/Time   WBC 9.0 09/18/2013 1049   RBC 4.68 09/18/2013 1049   HGB 15.3* 09/18/2013 1059  HCT 45.0 09/18/2013 1059   PLT 196 09/18/2013 1049   MCV 90.8 09/18/2013 1049   LYMPHSABS 1.9 09/18/2013 1049   MONOABS 0.5 09/18/2013 1049   EOSABS 0.3 09/18/2013 1049   BASOSABS 0.0 09/18/2013 1049    CMP     Component Value Date/Time   NA 144 09/21/2013 0330   K 4.6 09/21/2013 0330   CL 104 09/21/2013 0330   CO2 29 09/21/2013 0330   GLUCOSE 101* 09/21/2013 0330   BUN 27* 09/21/2013 0330   CREATININE 1.51* 09/21/2013 0330   CALCIUM 8.4 09/21/2013 0330   PROT 6.8 09/18/2013 1049   ALBUMIN 3.2* 09/18/2013 1049   AST 21 09/18/2013 1049   ALT 18 09/18/2013 1049   ALKPHOS 106 09/18/2013 1049   BILITOT 0.6 09/18/2013 1049   GFRNONAA 33* 09/21/2013 0330   GFRAA 38* 09/21/2013 0330    Assessment and Plan  TIA (transient ischemic attack) Presentation to hosp with LUE tingling-CT neg for acute (but did show old stroke) and MRI negative, 2D ECHO-neg, carotids -NAD  PE (pulmonary embolism) On chronic prophylaxis for PE and DVT; on admission was not theraputic but was reported as theraputic on d/c on 3 mg daily except for Saturday  DVT (deep venous thrombosis) See under PE  HYPERTENSION Pt started back on Lisinopril and coreg while hospitalized-will continue  HYPOTHYROIDISM TSH 3 days ago on synthroid 150 mg was 3.053-  acceptable  HYPERLIPIDEMIA Pt on zetia 10 mg and lipitor; LDL - 79, HDL- 44; well controlled  DIABETES MELLITUS, TYPE II HbA1c 6.6 on SSI only - on ACE and statin with LDL 79    Hennie Duos, MD

## 2013-09-22 NOTE — Assessment & Plan Note (Signed)
See under PE

## 2013-09-22 NOTE — Assessment & Plan Note (Signed)
TSH 3 days ago on synthroid 150 mg was 3.053- acceptable

## 2013-09-22 NOTE — Assessment & Plan Note (Signed)
Pt started back on Lisinopril and coreg while hospitalized-will continue

## 2013-09-22 NOTE — Assessment & Plan Note (Signed)
Presentation to hosp with LUE tingling-CT neg for acute (but did show old stroke) and MRI negative, 2D ECHO-neg, carotids -NAD

## 2013-09-22 NOTE — Assessment & Plan Note (Signed)
HbA1c 6.6 on SSI only - on ACE and statin with LDL 79

## 2013-10-08 ENCOUNTER — Other Ambulatory Visit: Payer: Self-pay | Admitting: Internal Medicine

## 2013-10-09 NOTE — Telephone Encounter (Signed)
Coumadin refilled per protocol. JG//CMA

## 2013-10-17 ENCOUNTER — Other Ambulatory Visit: Payer: Self-pay | Admitting: Internal Medicine

## 2013-10-18 ENCOUNTER — Ambulatory Visit: Payer: Medicare Other | Admitting: Neurology

## 2013-10-18 ENCOUNTER — Ambulatory Visit (INDEPENDENT_AMBULATORY_CARE_PROVIDER_SITE_OTHER): Payer: Medicare Other | Admitting: Neurology

## 2013-10-18 ENCOUNTER — Encounter: Payer: Self-pay | Admitting: Neurology

## 2013-10-18 VITALS — BP 120/74 | HR 68 | Temp 97.8°F | Ht 65.0 in | Wt 221.0 lb

## 2013-10-18 DIAGNOSIS — R200 Anesthesia of skin: Secondary | ICD-10-CM

## 2013-10-18 DIAGNOSIS — R251 Tremor, unspecified: Secondary | ICD-10-CM

## 2013-10-18 DIAGNOSIS — R209 Unspecified disturbances of skin sensation: Secondary | ICD-10-CM

## 2013-10-18 DIAGNOSIS — R259 Unspecified abnormal involuntary movements: Secondary | ICD-10-CM

## 2013-10-18 NOTE — Progress Notes (Signed)
NEUROLOGY CONSULTATION NOTE  Dawn Morrow MRN: [id number] DOB: 1936/07/22  Referring provider: Dr. Hosie Poisson Primary care provider: Dr. Belinda Fisher  Reason for consult:  Hospital followup  Dear Dr Karleen Hampshire:  Thank you for your kind referral of Dawn Morrow for neurology follow-up after her recent hospital admission.  Although her history is well known to you, please allow me to reiterate it for the purpose of our medical record.   HISTORY OF PRESENT ILLNESS: This is a pleasant 78 year old right-handed woman with multiple medical problems, including a history of asthma, COPD, DVT and PE on chronic anticoagulation, tremors, who was admitted at [facility] from January 12-15, 2015 when she fell at home with generalized weakness and left arm tingling.  On admission, INR was 1.87.  She had an MRI brain which I personally reviewed, no acute changes, moderate chronic small vessel ischemic disease. Remote lacunar infarct in the left putamen with associated susceptibility artifact suggestive of remote hemorrhage. There is mild to moderate cerebral atrophy. 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 Echo unremarakable, Carotid Dopplers with no significant ICA stenosis. LDL is 79. TSH is 3.05. She was resumed on Coumadin, last INR on discharge 09/21/12 was 2.43, no records available for most recent INR.  She was noted to be hypertensive, Coreg and lisinopril were restarted, with note of improvement.  Hemoglobin A1c was 6.6.   Since hospital discharge, she reports that her left hand continues to be numb.  It does not affect the forearm/arm.  Her left foot is numb as well.  She denies any pain.  No facial involvement.  No further falls since hospital discharge.  She lives alone and ambulates with a walker, but reports she will be moving to an assisted living facility within the next few days.  Her main concern today  are the hand tremors, which she reports started at age 56 or 40.  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  Occasionally her legs feels shaky when lying in bed.  She feels that the tremors have improved some after hospital discharge.  She is adopted and unaware of family history of tremors, however her grandson and great grandson have tremors.    She denies any change in sense of smell.  She has occasional constipation. She has urinary incontinence and uses adult diapers.  She denies any headaches, dizziness, diplopia, dysarthria, dysphagia, neck/back pain.  Records and images were personally reviewed where available.   Laboratory Data: Lab Results  Component Value Date   WBC 9.0 09/18/2013   HGB 15.3* 09/18/2013   HCT 45.0 09/18/2013   MCV 90.8 09/18/2013   PLT 196 09/18/2013     Chemistry      Component Value Date/Time   NA 144 09/21/2013 0330   K 4.6 09/21/2013 0330   CL 104 09/21/2013 0330   CO2 29 09/21/2013 0330   BUN 27* 09/21/2013 0330   CREATININE 1.51* 09/21/2013 0330      Component Value Date/Time   CALCIUM 8.4 09/21/2013 0330   ALKPHOS 106 09/18/2013 1049   AST 21 09/18/2013 1049   ALT 18 09/18/2013 1049   BILITOT 0.6 09/18/2013 1049     Lab Results  Component Value Date   HGBA1C 6.6* 09/19/2013   Lab Results  Component Value Date   TSH 3.053 09/19/2013    PAST MEDICAL HISTORY: Past Medical History  Diagnosis Date  . Asthma   . COPD (chronic obstructive pulmonary disease)   . DVT (deep vein thrombosis) in pregnancy     hx x multiple on coumadin  . Pulmonary embolism     x 2  . GERD (gastroesophageal reflux disease)   . Hypertension   . Osteopenia   . Hypothyroidism   . Hyperlipidemia   . Osteoarthritis     h/o spinal stenosis by MRI 2009  . Back pain   . S/P lumbar fusion   . Hiatal hernia   . Diverticulosis   . Hemorrhoids   . Tubular adenoma  of colon 07/2005  . Anxiety and depression   . Anxiety   . Cataract   . Depression   . Stroke     MINI  . Diabetes mellitus     PAST SURGICAL HISTORY: Past Surgical History  Procedure Laterality Date  . Appendectomy    . Cholecystectomy    . Tubal ligation    . Tonsillectomy    . Lumbar fusion    . Cataract extraction    . Colonoscopy    . Polypectomy      MEDICATIONS: Current Outpatient Prescriptions on File Prior to Visit  Medication Sig Dispense Refill  . albuterol (VENTOLIN HFA) 108 (90 BASE) MCG/ACT inhaler Inhale 2 puffs into the lungs every 4 (four) hours as needed.  1 Inhaler  6  . aspirin 81 MG chewable tablet Chew 1 tablet (81 mg total) by mouth daily.      Marland Kitchen aspirin-acetaminophen-caffeine (EXCEDRIN MIGRAINE) 250-250-65 MG per tablet Take 2 tablets by mouth every 6 (six) hours as needed for headache.      Marland Kitchen atorvastatin (LIPITOR) 40 MG tablet Take 40 mg by mouth daily.      Marland Kitchen buPROPion (WELLBUTRIN XL) 300 MG 24 hr tablet Take 300 mg by mouth daily.      . carvedilol (COREG) 12.5 MG tablet Take 1 tablet (12.5 mg total) by mouth daily.  90 tablet  2  . levothyroxine (SYNTHROID, LEVOTHROID) 150 MCG tablet Take 150 mcg by mouth daily.      Marland Kitchen lisinopril (PRINIVIL,ZESTRIL) 10 MG tablet Take 1 tablet (10 mg total) by mouth daily.  30 tablet  6  . meclizine (ANTIVERT) 25 MG tablet Take 1 tablet (25 mg total) by mouth at bedtime.  30 tablet  6  . warfarin (COUMADIN) 6 MG tablet Take 3 mg by mouth See admin instructions. Takes 3mg  daily every day of week except for Saturday-takes nothing      . warfarin (COUMADIN) 6 MG tablet TAKE AS DIRECTED BY COUMADIN CLINIC  45 tablet  0  . ZETIA 10 MG tablet TAKE 1 TABLET BY MOUTH EVERY DAY  90 tablet  0   Current Facility-Administered Medications on File Prior to Visit  Medication Dose Route Frequency Provider Last Rate Last Dose  . zoledronic acid (RECLAST) injection 5 mg  5 mg Intravenous Once Colon Branch, MD         ALLERGIES: Allergies  Allergen Reactions  . Penicillins Anaphylaxis  . Codeine Other (See Comments)    unknown  . Sulfonamide Derivatives Other (See Comments)    unknown    FAMILY HISTORY: Family History  Problem Relation Age of Onset  . Adopted: Yes  . Cancer Mother     ? colon or ovarian  . Diabetes Mother   .  Cancer Brother     ?    SOCIAL HISTORY: History   Social History  . Marital Status: Divorced    Spouse Name: N/A    Number of Children: 3  . Years of Education: N/A   Occupational History  . retired    Social History Main Topics  . Smoking status: Former Smoker    Types: Cigarettes  . Smokeless tobacco: Never Used  . Alcohol Use: No     Comment: socially  . Drug Use: No  . Sexual Activity: Not on file   Other Topics Concern  . Not on file   Social History Narrative  . No narrative on file    REVIEW OF SYSTEMS: Constitutional: No fevers, chills, or sweats, no generalized fatigue, change in appetite Eyes: No visual changes, double vision, eye pain Ear, nose and throat: No hearing loss, ear pain, nasal congestion, sore throat Cardiovascular: No chest pain, palpitations Respiratory:  No shortness of breath at rest or with exertion, wheezes GastrointestinaI: No nausea, vomiting, diarrhea, abdominal pain, fecal incontinence Genitourinary:  No dysuria, urinary retention or frequency Musculoskeletal:  No neck pain, back pain Integumentary: No rash, pruritus, skin lesions Neurological: as above Psychiatric: No depression, insomnia, anxiety Endocrine: No palpitations, fatigue, diaphoresis, mood swings, change in appetite, change in weight, increased thirst Hematologic/Lymphatic:  No anemia, purpura, petechiae. Allergic/Immunologic: no itchy/runny eyes, nasal congestion, recent allergic reactions, rashes  PHYSICAL EXAM: Filed Vitals:   10/18/13 1022  BP: 120/74  Pulse: 68  Temp: 97.8 F (36.6 C)   General: No acute distress Head:   Normocephalic/atraumatic Neck: supple, no paraspinal tenderness, full range of motion Back: No paraspinal tenderness Heart: regular rate and rhythm Lungs: Clear to auscultation bilaterally. Vascular: No carotid bruits. Skin/Extremities: No rash, no edema Neurological Exam: Mental status: alert and oriented to person, place, and time, no dysarthria or dysphagia, Fund of knowledge is appropriate.  Recent and remote memory are intact.  Attention and concentration are normal.    Able to name objects and repeat phrases. Cranial nerves: CN I: not tested CN II: pupils equal, round and reactive to light, visual fields intact, fundi unremarkable. CN III, IV, VI:  full range of motion, no nystagmus, no ptosis CN V: decreased pin and cold over the left V1-3 distribution CN VII: upper and lower face symmetric CN VIII: hearing intact CN IX, X: gag intact, uvula midline CN XI: sternocleidomastoid and trapezius muscles intact CN XII: tongue midline Bulk & Tone: normal, no cogwheeling, no fasciculations Motor: 5/5 throughout with no pronator drift. Sensation: patchy decreased sensation to pin and cold on both UE and LE, inconsistent.   Deep Tendon Reflexes: +2 throughout except for absent ankle jerks bilaterally, no clonus Plantar responses: downgoing bilaterally Finger to nose testing: no incoordination with note of bilateral endpoint tremor  Gait: able to rise from wheelchair without assist, took a few slow cautious steps without ataxia, fair arm swing. Tremors: No resting or postural tremor. Note of bilateral endpoint and action tremor in the arms.  IMPRESSION: This is a pleasant 78 year old right-handed woman with multiple medical problems including DVT and PE on chronic anticoagulation, recently admitted after a fall, with generalized weakness and left arm tingling.  Her MRI brain does not show any acute stroke.  She continues to have left hand numbness, possibly related to cervical pathology  versus carpal tunnel syndrome.  She is advised to wear a wrist splint.  She will continue physical therapy.  We discussed her tremors, consistent  with essential tremor.  Beta-blockers are helpful for essential tremor, she is currently on carvedilol, consideration for switching to metoprolol or propranolol can be done in the future.  We also discussed the small left MCA aneurysm and low annual risk of rupture.  We discussed control of blood pressure and re-imaging in a year.  Fall and gait precautions were discussed.  She expressed understanding and will follow-up in 3 months.  I tried to call listed number for her niece, however there was no answer.    Thank you for allowing me to participate in the care of this patient. Please do not hesitate to call for any questions or concerns.   Ellouise Newer, M.D.

## 2013-10-18 NOTE — Patient Instructions (Addendum)
1. Continue to monitor blood pressure and INR 2. Use left wrist splint for left hand numbness 3. Continue physical therapy 4. Follow-up in 3 months

## 2013-10-28 ENCOUNTER — Non-Acute Institutional Stay (SKILLED_NURSING_FACILITY): Payer: Medicare Other | Admitting: Internal Medicine

## 2013-10-28 ENCOUNTER — Encounter: Payer: Self-pay | Admitting: Internal Medicine

## 2013-10-28 DIAGNOSIS — E785 Hyperlipidemia, unspecified: Secondary | ICD-10-CM

## 2013-10-28 DIAGNOSIS — I10 Essential (primary) hypertension: Secondary | ICD-10-CM

## 2013-10-28 DIAGNOSIS — E119 Type 2 diabetes mellitus without complications: Secondary | ICD-10-CM

## 2013-10-28 DIAGNOSIS — I2699 Other pulmonary embolism without acute cor pulmonale: Secondary | ICD-10-CM

## 2013-10-28 DIAGNOSIS — J449 Chronic obstructive pulmonary disease, unspecified: Secondary | ICD-10-CM

## 2013-10-28 DIAGNOSIS — E039 Hypothyroidism, unspecified: Secondary | ICD-10-CM

## 2013-10-28 DIAGNOSIS — I82409 Acute embolism and thrombosis of unspecified deep veins of unspecified lower extremity: Secondary | ICD-10-CM

## 2013-10-28 DIAGNOSIS — G459 Transient cerebral ischemic attack, unspecified: Secondary | ICD-10-CM

## 2013-10-28 DIAGNOSIS — K219 Gastro-esophageal reflux disease without esophagitis: Secondary | ICD-10-CM

## 2013-10-28 NOTE — Progress Notes (Signed)
MRN: [id number] Name: Dawn Morrow  Sex: female Age: 78 y.o. DOB: 1936-02-19  Morrison #: Karren Burly Facility/Room: 122A Level Of Care: SNF Provider: Inocencio Homes D Emergency Contacts: Extended Emergency Contact Information Primary Emergency Contact: Wende Mott of [place] Phone: [phone] Relation: Son Secondary Emergency Contact: Blankenship,[name] Address: [address] [place], [place] [address] [place] Phone: [phone] Mobile Phone: [phone] Relation: Niece  Code Status: FULL  Allergies: Penicillins; Codeine; and Sulfonamide derivatives  Chief Complaint  Patient presents with  . Discharge Note    HPI: Patient is 78 y.o. female who is being discharged to assisted living facility.  Past Medical History  Diagnosis Date  . Asthma   . COPD (chronic obstructive pulmonary disease)   . DVT (deep vein thrombosis) in pregnancy     hx x multiple on coumadin  . Pulmonary embolism     x 2  . GERD (gastroesophageal reflux disease)   . Hypertension   . Osteopenia   . Hypothyroidism   . Hyperlipidemia   . Osteoarthritis     h/o spinal stenosis by MRI 2009  . Back pain   . S/P lumbar fusion   . Hiatal hernia   . Diverticulosis   . Hemorrhoids   . Tubular adenoma of colon 07/2005  . Anxiety and depression   . Anxiety   . Cataract   . Depression   . Stroke     MINI  . Diabetes mellitus     Past Surgical History  Procedure Laterality Date  . Appendectomy    . Cholecystectomy    . Tubal ligation    . Tonsillectomy    . Lumbar fusion    . Cataract extraction    . Colonoscopy    . Polypectomy        Medication List       This list is accurate as of: 10/28/13  2:43 PM.  Always use your most recent med list.               albuterol 108 (90 BASE) MCG/ACT inhaler  Commonly known as:  VENTOLIN HFA  Inhale 2 puffs into the lungs every 4 (four) hours as needed.     aspirin 81 MG tablet  Take  81 mg by mouth daily.     aspirin-acetaminophen-caffeine [id number] MG per tablet  Commonly known as:  EXCEDRIN MIGRAINE  Take 2 tablets by mouth every 6 (six) hours as needed for headache.     atorvastatin 40 MG tablet  Commonly known as:  LIPITOR  Take 40 mg by mouth daily.     buPROPion 300 MG 24 hr tablet  Commonly known as:  WELLBUTRIN XL  Take 300 mg by mouth daily.     carvedilol 12.5 MG tablet  Commonly known as:  COREG  Take 1 tablet (12.5 mg total) by mouth daily.     levothyroxine 150 MCG tablet  Commonly known as:  SYNTHROID, LEVOTHROID  Take 150 mcg by mouth daily.     lisinopril 10 MG tablet  Commonly known as:  PRINIVIL,ZESTRIL  Take 1 tablet (10 mg total) by mouth daily.     meclizine 25 MG tablet  Commonly known as:  ANTIVERT  Take 1 tablet (25 mg total) by mouth at bedtime.     warfarin 6 MG tablet  Commonly known as:  COUMADIN  Take 3 mg by mouth See admin instructions. Takes 3mg  daily  every day of week except for Saturday-takes nothing     warfarin 6 MG tablet  Commonly known as:  COUMADIN  TAKE AS DIRECTED BY COUMADIN CLINIC     ZETIA 10 MG tablet  Generic drug:  ezetimibe  TAKE 1 TABLET BY MOUTH EVERY DAY        Meds ordered this encounter  Medications  . aspirin 81 MG tablet    Sig: Take 81 mg by mouth daily.    Immunization History  Administered Date(s) Administered  . Influenza Split 09/28/2011, 05/30/2012  . Influenza Whole 07/08/2004, 06/29/2007, 05/30/2008, 06/04/2009, 06/06/2010  . Influenza, High Dose Seasonal PF 07/24/2013  . Pneumococcal Polysaccharide-23 07/12/2004, 02/27/2009  . Td 09/13/2007  . Zoster 06/08/2011    History  Substance Use Topics  . Smoking status: Former Smoker    Types: Cigarettes  . Smokeless tobacco: Never Used  . Alcohol Use: No     Comment: socially    Filed Vitals:   10/28/13 1427  BP: 139/74  Pulse: 72  Temp: 98.5 F (36.9 C)  Resp: 24    Physical Exam  GENERAL APPEARANCE: Alert,  conversant. Appropriately groomed. No acute distress.  HEENT: Unremarkable. RESPIRATORY: Breathing is even, unlabored. Lung sounds are clear   CARDIOVASCULAR: Heart RRR no murmurs, rubs or gallops. No peripheral edema.  GASTROINTESTINAL: Abdomen is soft, non-tender, not distended w/ normal bowel sounds.  NEUROLOGIC: Cranial nerves 2-12 grossly intact. Moves all extremities no tremor.  Patient Active Problem List   Diagnosis Date Noted  . Numbness 10/18/2013  . TIA (transient ischemic attack) 09/19/2013  . Weakness 09/18/2013  . Annual physical exam 05/30/2012  . Tremor 01/21/2012  . Failure to thrive and poor med compliance  01/21/2012  . PE (pulmonary embolism) 11/18/2010  . DVT (deep venous thrombosis) 11/18/2010  . Long term current use of anticoagulant 11/18/2010  . ABNORMAL ELECTROCARDIOGRAM 11/10/2010  . OSTEOARTHRITIS 08/06/2010  . DIZZINESS 04/15/2010  . DIABETES MELLITUS, TYPE II 06/04/2009  . ACNE ROSACEA 11/28/2008  . BACK PAIN 05/16/2008  . HIP PAIN, RIGHT, CHRONIC 09/15/2007  . HYPOTHYROIDISM 09/13/2007  . HYPERLIPIDEMIA 09/13/2007  . Osteoporosis 07/22/2007  . DEPRESSION 09/11/2006  . HYPERTENSION 09/11/2006  . ASTHMA 09/11/2006  . COPD 09/11/2006  . GERD 09/11/2006  . UTI'S, RECURRENT 09/11/2006  . GREENFIELD FILTER INSERTION, HX OF 09/11/2006    CBC    Component Value Date/Time   WBC 9.0 09/18/2013 1049   RBC 4.68 09/18/2013 1049   HGB 15.3* 09/18/2013 1059   HCT 45.0 09/18/2013 1059   PLT 196 09/18/2013 1049   MCV 90.8 09/18/2013 1049   LYMPHSABS 1.9 09/18/2013 1049   MONOABS 0.5 09/18/2013 1049   EOSABS 0.3 09/18/2013 1049   BASOSABS 0.0 09/18/2013 1049    CMP     Component Value Date/Time   NA 144 09/21/2013 0330   K 4.6 09/21/2013 0330   CL 104 09/21/2013 0330   CO2 29 09/21/2013 0330   GLUCOSE 101* 09/21/2013 0330   BUN 27* 09/21/2013 0330   CREATININE 1.51* 09/21/2013 0330   CALCIUM 8.4 09/21/2013 0330   PROT 6.8 09/18/2013 1049   ALBUMIN 3.2*  09/18/2013 1049   AST 21 09/18/2013 1049   ALT 18 09/18/2013 1049   ALKPHOS 106 09/18/2013 1049   BILITOT 0.6 09/18/2013 1049   GFRNONAA 33* 09/21/2013 0330   GFRAA 38* 09/21/2013 0330    Assessment and Plan  Patient is stable and ready for discharge to assisted living with HH,OT, PT. She will  need a wheelchair and walker available on her arrival. She has a PT/INR due 11/02/2013.  Hennie Duos, MD

## 2013-11-06 ENCOUNTER — Ambulatory Visit: Payer: Medicare Other | Admitting: Internal Medicine

## 2013-11-06 ENCOUNTER — Telehealth: Payer: Self-pay | Admitting: General Practice

## 2013-11-06 NOTE — Telephone Encounter (Signed)
[facility] PT called in regards to pt. Would like a verbal order for Pt 2x/week for 3 weeks for gait, transfer, and balance. Would like a call back at [phone]

## 2013-11-06 NOTE — Telephone Encounter (Signed)
Agree 

## 2013-11-06 NOTE — Telephone Encounter (Signed)
PT notified

## 2013-11-07 NOTE — Telephone Encounter (Signed)
Dawn Morrow with [facility] called to obtain verbal orders for OT.  Enacted verbal orders per PCP

## 2013-11-08 ENCOUNTER — Encounter: Payer: Self-pay | Admitting: Internal Medicine

## 2013-11-08 ENCOUNTER — Ambulatory Visit (INDEPENDENT_AMBULATORY_CARE_PROVIDER_SITE_OTHER): Payer: Medicare Other | Admitting: Internal Medicine

## 2013-11-08 VITALS — BP 128/65 | HR 76 | Temp 98.2°F | Wt 218.0 lb

## 2013-11-08 DIAGNOSIS — I10 Essential (primary) hypertension: Secondary | ICD-10-CM

## 2013-11-08 DIAGNOSIS — E119 Type 2 diabetes mellitus without complications: Secondary | ICD-10-CM

## 2013-11-08 DIAGNOSIS — E039 Hypothyroidism, unspecified: Secondary | ICD-10-CM

## 2013-11-08 DIAGNOSIS — Z7901 Long term (current) use of anticoagulants: Secondary | ICD-10-CM

## 2013-11-08 LAB — POCT INR: INR: 3.8

## 2013-11-08 NOTE — Progress Notes (Signed)
Subjective:    Patient ID: Dawn Morrow, female    DOB: [birth year], 78 y.o.   MRN: [id number]  DOS:  11/08/2013 Type of  visit: ROV She was seen last at the office 07-2013 since then she was admitted to the hospital 09-2013 after a fall, diagnosed with a TIA. She was discharged to a rehabilitation facility, subsequently she went home. Moved to [facility] in [place] last week. She saw neurology. Chart reviewed and summarized below. She is here for followup with her niece.   Admit date: 09/18/2013  Discharge date: 09/21/2013    #1 left upper extremity tingling and numbness/probable TIA  Patient was admitted with left upper extremity numbness and tingling as well as generalized weakness. Patient on admission was noted to have an INR of 1.87 and was on chronic anticoagulation for DVT/PE. Patient was admitted for stroke/TIA workup. Head CT which was done was negative for any acute abnormality however did show old stroke. MRI of the head does not show an acute stroke. MRA shows a 3.5 mm left MCA bifurcation aneurysm. 2-D echo with no source of emboli. Carotid Dopplers with no significant ICA stenosis. LDL is 79. TSH is 3.05. Patient has been resumed back on her Coumadin which is currently therapeutic. Patient improved clinically and will be discharged to a skilled nursing facility in stable and improved condition. Patient will followup with neurology as outpatient for further evaluation and management.  #2 history of DVT/PE  Patient noted to be on chronic anticoagulation secondary to history of DVT/PE. On admission patient's INR was subtherapeutic. Patient's Coumadin was resumed and INR became therapeutic. Patient will followup as outpatient.  #3 accelerated hypertension  During the hospitalization was noted to be hypertensive. Patient was started back on Coreg and lisinopril with better blood pressure improvement. Patient will followup with PCP as outpatient.  #4 hypothyroidism  Continued on  Synthroid.  #5 hyperlipidemia  LDL is 79. Continued on Lipitor and Zetia.  #6 type 2 diabetes  Hemoglobin A1c 6.6. Continue sliding scale insulin. Patient will need outpatient followup.   Saw neuro 10-18-2013 IMPRESSION:  This is a pleasant 78 year old right-handed woman with multiple medical problems including DVT and PE on chronic anticoagulation, recently admitted after a fall, with generalized weakness and left arm tingling. Her MRI brain does not show any acute stroke. She continues to have left hand numbness, possibly related to cervical pathology versus carpal tunnel syndrome. She is advised to wear a wrist splint. She will continue physical therapy. We discussed her tremors, consistent with essential tremor. Beta-blockers are helpful for essential tremor, she is currently on carvedilol, consideration for switching to metoprolol or propranolol can be done in the future. We also discussed the small left MCA aneurysm and low annual risk of rupture. We discussed control of blood pressure and re-imaging in a year. Fall and gait precautions were discussed. She expressed understanding and will follow-up in 3 months. I tried to call listed number for her niece, however there was no answer.     ROS Since she left the hospital, no further falls. Denies chest pain or difficulty breathing at rest, + dyspnea on exertion (no new sx). Denies orthopnea. No nausea, vomiting, diarrhea or blood in the stools.  Past Medical History:   AODM   Hypertension   Asthma, COPD   Depression   Osteopenia   hypothyroidism   Hyperlipidemia   GERD   Tremor (dx at a young age   Hematology  --Multiple DVTs before, on coumadin   --  Pulmonary embolism, hx of x 2   --increased homocysreine levels   Osteoarthritis (h/o spinal stenosis by MRI 2009, ongoing back pain)   Past Surgical History:   Appendectomy   Cholecystectomy   Lumbar fusion, in the 90s   Tubal ligation   Tonsillectomy   Family History:     colon ca-- biological M   breast ca-- no but patient not sure   DM-- M   CAD-- no   Social History:   Single,   lost her sister in 2012. 3 children , all live in [place] to [place] late 10-2013 [employer], lives in [place]; nice [place] in [place] [phone], [phone]) Uses a diaper.   Doesn't drive   tobacco--no currently a smoker   ETOH-- socially        Medication List       This list is accurate as of: 11/08/13 11:59 PM.  Always use your most recent med list.               albuterol 108 (90 BASE) MCG/ACT inhaler  Commonly known as:  VENTOLIN HFA  Inhale 2 puffs into the lungs every 4 (four) hours as needed.     aspirin 81 MG tablet  Take 81 mg by mouth daily.     aspirin-acetaminophen-caffeine 712-458-09 MG per tablet  Commonly known as:  EXCEDRIN MIGRAINE  Take 2 tablets by mouth every 6 (six) hours as needed for headache.     atorvastatin 40 MG tablet  Commonly known as:  LIPITOR  Take 40 mg by mouth daily.     buPROPion 300 MG 24 hr tablet  Commonly known as:  WELLBUTRIN XL  Take 300 mg by mouth daily.     carvedilol 12.5 MG tablet  Commonly known as:  COREG  Take 1 tablet (12.5 mg total) by mouth daily.     levothyroxine 150 MCG tablet  Commonly known as:  SYNTHROID, LEVOTHROID  Take 150 mcg by mouth daily.     lisinopril 10 MG tablet  Commonly known as:  PRINIVIL,ZESTRIL  Take 1 tablet (10 mg total) by mouth daily.     meclizine 25 MG tablet  Commonly known as:  ANTIVERT  Take 1 tablet (25 mg total) by mouth at bedtime.     warfarin 6 MG tablet  Commonly known as:  COUMADIN  TAKE AS DIRECTED BY COUMADIN CLINIC     ZETIA 10 MG tablet  Generic drug:  ezetimibe  TAKE 1 TABLET BY MOUTH EVERY DAY           Objective:   Physical Exam BP 128/65  Pulse 76  Temp(Src) 98.2 F (36.8 C)  Wt 218 lb (98.884 kg)  SpO2 95% General -- alert, NAD.  Lungs -- normal respiratory effort, no intercostal retractions, no accessory  muscle use, and normal breath sounds.  Heart-- normal rate, regular rhythm, no murmur. Extremities-- +/+++ pretibial edema bilaterally  Neurologic--  alert & oriented X3. Speech normal, gait assisted by a walker  Psych-- Cognition and judgment appear intact. Cooperative with normal attention span and concentration. No anxious or depressed appearing.      Assessment & Plan:   Admitted after a fall, Admitted after a fall, TIA? Subsequently she saw neurology as an outpatient, note reviewed, TIA not belived to be in the differential. No further falls, had physical therapy at rehab, to start physical therapy soon and to place she is living.

## 2013-11-08 NOTE — Progress Notes (Signed)
Pre visit review using our clinic review tool, if applicable. No additional management support is needed unless otherwise documented below in the visit note. 

## 2013-11-08 NOTE — Patient Instructions (Signed)
Get your blood work before you leave   HOLD coumadin x 2 days  Coumadin 3 mg one tablet every day except Tuesday and Friday take instead  Coumadin 4 mg one tablet a day . INR in 2 weeks, go to the coumadin clinic @ cardiology or fax results to them    Next visit is for routine check up in 2 months  No need to come back fasting Please make an appointment

## 2013-11-09 LAB — BASIC METABOLIC PANEL
BUN: 23 mg/dL (ref 6–23)
CO2: 26 mEq/L (ref 19–32)
Calcium: 9 mg/dL (ref 8.4–10.5)
Chloride: 108 mEq/L (ref 96–112)
Creatinine, Ser: 1.3 mg/dL — ABNORMAL HIGH (ref 0.4–1.2)
GFR: 40.91 mL/min — ABNORMAL LOW (ref >60.00)
Glucose, Bld: 114 mg/dL — ABNORMAL HIGH (ref 70–99)
Potassium: 4.8 mEq/L (ref 3.5–5.1)
Sodium: 140 mEq/L (ref 135–145)

## 2013-11-09 NOTE — Assessment & Plan Note (Signed)
Hypothyroidism , last TSH satisfactory

## 2013-11-09 NOTE — Assessment & Plan Note (Signed)
Diabetes, last A1c satisfactory

## 2013-11-09 NOTE — Assessment & Plan Note (Signed)
Hypertension, well controlled, check a BMP

## 2013-11-09 NOTE — Assessment & Plan Note (Signed)
Anticoagulation: The records her current dose of Coumadin is: Coumadin 3 mg one tablet every Monday Coumadin 4 mg one tablet every day except Monday 27 mg/w INR today 3.8 Plan: HOLD coumadin x 2 days  Coumadin 3 mg one tablet Every day except Tuesday and Friday -- take  Coumadin 4 mg one tablet daily. 23 mg/w INR in 2 weeks

## 2013-11-10 ENCOUNTER — Telehealth: Payer: Self-pay | Admitting: *Deleted

## 2013-11-10 NOTE — Telephone Encounter (Signed)
Miranda, RN for [facility], called and stated that patient's INR was 5.4 today.   Patient was seen in our office on 11/08/2013 and her INR was checked and the result was 3.8. Patient was taking warfarin 3mg  on Mondays and warfarin 4mg  on all other days. Your instructions was to hold Warfarin for 2 days. Please advise. JG//CMA

## 2013-11-10 NOTE — Telephone Encounter (Signed)
HOLD coumadin x 3 days   Coumadin 3 mg one tablet every day ; on Tuesday and Friday take instead one 4 mg- tablet  a day . INR in 2 weeks, go to the coumadin clinic @ cardiology or fax results to them

## 2013-11-13 NOTE — Telephone Encounter (Signed)
Called and informed Dawn Morrow, left a message, on Friday November 10, 2013 @ 4:55 pm. JG//CMA

## 2013-11-14 ENCOUNTER — Telehealth: Payer: Self-pay | Admitting: *Deleted

## 2013-11-14 DIAGNOSIS — E119 Type 2 diabetes mellitus without complications: Secondary | ICD-10-CM

## 2013-11-14 DIAGNOSIS — I69959 Hemiplegia and hemiparesis following unspecified cerebrovascular disease affecting unspecified side: Secondary | ICD-10-CM

## 2013-11-14 DIAGNOSIS — E039 Hypothyroidism, unspecified: Secondary | ICD-10-CM

## 2013-11-14 DIAGNOSIS — Z7901 Long term (current) use of anticoagulants: Secondary | ICD-10-CM

## 2013-11-14 NOTE — Telephone Encounter (Signed)
Received Home Health Certification and Plan of Care via fax for patient from [facility]. Billing sheet attached, medications verified and placed in blue folder for Dr. Larose Kells. JG//CMA

## 2013-11-15 ENCOUNTER — Telehealth: Payer: Self-pay | Admitting: *Deleted

## 2013-11-15 ENCOUNTER — Telehealth: Payer: Self-pay | Admitting: Internal Medicine

## 2013-11-15 NOTE — Telephone Encounter (Signed)
Received fax from [facility] for home health certification addendum/face-to-face encounter acknoledgement report. Billing sheet attached and placed in blue folder for Dr. Larose Kells. JG//CMA

## 2013-11-15 NOTE — Telephone Encounter (Addendum)
Maricela Bo called back in reference to note below.  Rushie Goltz I would call back after verifying what Dr. Larose Kells wanted to speak to her about.  Called Lanae Boast back and was told by Enid Derry (staff member) that she was in a staff meeting today until 5pm.  Left message to have Lanae Boast call back after returning from meeting.  Enid Derry stated understanding.

## 2013-11-15 NOTE — Telephone Encounter (Addendum)
INR 11-13-13 done at the facility she is at was -----> 2.2, unable to communicate via phone, faxed a request for a call back Like to discuss directly w/  them dosing and next INR

## 2013-11-16 NOTE — Telephone Encounter (Signed)
Dawn Morrow returned phone call.  Stated that patient is currently on Coumadin 3 mg on Monday, Tuesday, Thurs., Sat., and Sunday, then Coumadin 4 mg on Tues and Friday.  Her next INR at the facility is scheduled for 11/23/13.   This information was read back for verification.    Sonia Baller asked that all orders be faxed to [phone].

## 2013-11-16 NOTE — Telephone Encounter (Signed)
I agree, please fax the orders . Next INR needs to be discuss with the Coumadin clinic

## 2013-11-16 NOTE — Telephone Encounter (Signed)
Dr. Larose Kells agrees with patient's current dose of Coumadin @ 3 mg on Monday, Tuesday, Thurs., Sat., and Sunday, then Coumadin 4 mg on Tues and Friday.  He asks that next INR be discussed with Coumadin Clinic.

## 2013-11-17 NOTE — Telephone Encounter (Signed)
Order faxed to [phone].

## 2013-11-20 ENCOUNTER — Telehealth: Payer: Self-pay | Admitting: *Deleted

## 2013-11-20 NOTE — Telephone Encounter (Signed)
Received fax from [facility] for an order for 3 in 1 bedside commode. The occupational therapist, Flonnie Hailstone, states that patient labors with sit to stand during toilet transfers and needs hand grip support to improve safety and reduce fall risk. The order was placed in red folder for Dr. Larose Kells.

## 2013-11-23 ENCOUNTER — Telehealth: Payer: Self-pay | Admitting: *Deleted

## 2013-11-23 NOTE — Telephone Encounter (Signed)
Flonnie Hailstone OT called requesting continue home health OT referral / order to address safety / balance / coordination. States that patient still impulsive and experiencing weakness difficulties.  -OT for twice a week for two weeks . Please advise.    OT office number [phone] or Flonnie Hailstone cell-[phone].

## 2013-11-23 NOTE — Telephone Encounter (Signed)
Ok to cont

## 2013-11-24 LAB — PROTIME-INR

## 2013-11-24 NOTE — Telephone Encounter (Signed)
Spoke with Flonnie Hailstone OT notified of continue therapy.

## 2013-11-28 ENCOUNTER — Telehealth: Payer: Self-pay | Admitting: Internal Medicine

## 2013-11-28 NOTE — Telephone Encounter (Signed)
Patient niece called stating that they left a message regarding patient having a cold and wanted to see if dr Larose Kells would call in an antibiotic for her. If possible It would need to be called into [facility]. Please advise

## 2013-11-28 NOTE — Telephone Encounter (Signed)
Spoke with granddaughter. Advised we would need to see patient. Scheduled appt to bring her in.

## 2013-11-29 ENCOUNTER — Ambulatory Visit (INDEPENDENT_AMBULATORY_CARE_PROVIDER_SITE_OTHER): Payer: Medicare Other | Admitting: Internal Medicine

## 2013-11-29 ENCOUNTER — Encounter: Payer: Self-pay | Admitting: Internal Medicine

## 2013-11-29 VITALS — BP 135/80 | HR 66 | Temp 97.5°F | Wt 217.0 lb

## 2013-11-29 DIAGNOSIS — R059 Cough, unspecified: Secondary | ICD-10-CM

## 2013-11-29 DIAGNOSIS — R05 Cough: Secondary | ICD-10-CM

## 2013-11-29 MED ORDER — AZITHROMYCIN 250 MG PO TABS: ORAL_TABLET | ORAL | Status: DC

## 2013-11-29 NOTE — Progress Notes (Signed)
Pre visit review using our clinic review tool, if applicable. No additional management support is needed unless otherwise documented below in the visit note. 

## 2013-11-29 NOTE — Progress Notes (Signed)
Subjective:    Patient ID: Dawn Morrow, female    DOB: 1935-09-12, 78 y.o.   MRN: [id number]  DOS:  11/29/2013 Type of  visit: Acute visit  Symptoms started about 8 days ago: Cough, occasional sputum production, chest congestion. Just not feeling well, malaise.   ROS No nausea, vomiting, diarrhea. No hemoptysis. Does not know if she is running fevers. + watery eyes and occasional wheezing. + Mild shortness of breath more than usual.     Past Medical History  Diagnosis Date  . Asthma   . COPD (chronic obstructive pulmonary disease)   . DVT (deep vein thrombosis) in pregnancy     hx x multiple on coumadin  . Pulmonary embolism     x 2  . GERD (gastroesophageal reflux disease)   . Hypertension   . Osteopenia   . Hypothyroidism   . Hyperlipidemia   . Osteoarthritis     h/o spinal stenosis by MRI 2009  . Hiatal hernia   . Diverticulosis   . Hemorrhoids   . Tubular adenoma of colon 07/2005  . Anxiety and depression   . Cataract   . Stroke     MINI  . Diabetes mellitus     Past Surgical History  Procedure Laterality Date  . Appendectomy    . Cholecystectomy    . Tubal ligation    . Tonsillectomy    . Lumbar fusion    . Cataract extraction    . Colonoscopy    . Polypectomy      History   Social History  . Marital Status: Divorced    Spouse Name: N/A    Number of Children: 3  . Years of Education: N/A   Occupational History  . retired    Social History Main Topics  . Smoking status: Former Smoker    Types: Cigarettes  . Smokeless tobacco: Never Used  . Alcohol Use: No     Comment: socially  . Drug Use: No  . Sexual Activity: Not on file   Other Topics Concern  . Not on file   Social History Narrative   Single,   lost her sister in 2012. 3 children , all live in [place] to [place] late 10-2013   [employer], lives in [place]; nice [place] in [place] [phone], [phone])   Uses a diaper.     Doesn't drive            Medication List       This list is accurate as of: 11/29/13 11:59 PM.  Always use your most recent med list.               albuterol 108 (90 BASE) MCG/ACT inhaler  Commonly known as:  VENTOLIN HFA  Inhale 2 puffs into the lungs every 4 (four) hours as needed.     aspirin 81 MG tablet  Take 81 mg by mouth daily.     aspirin-acetaminophen-caffeine 621-308-65 MG per tablet  Commonly known as:  EXCEDRIN MIGRAINE  Take 2 tablets by mouth every 6 (six) hours as needed for headache.     atorvastatin 40 MG tablet  Commonly known as:  LIPITOR  Take 40 mg by mouth daily.     azithromycin 250 MG tablet  Commonly known as:  ZITHROMAX Z-PAK  As directed     buPROPion 300 MG 24 hr tablet  Commonly known as:  WELLBUTRIN XL  Take 300 mg by mouth daily.  carvedilol 12.5 MG tablet  Commonly known as:  COREG  Take 1 tablet (12.5 mg total) by mouth daily.     levothyroxine 150 MCG tablet  Commonly known as:  SYNTHROID, LEVOTHROID  Take 150 mcg by mouth daily.     lisinopril 10 MG tablet  Commonly known as:  PRINIVIL,ZESTRIL  Take 1 tablet (10 mg total) by mouth daily.     meclizine 25 MG tablet  Commonly known as:  ANTIVERT  Take 1 tablet (25 mg total) by mouth at bedtime.     warfarin 6 MG tablet  Commonly known as:  COUMADIN  TAKE AS DIRECTED BY COUMADIN CLINIC     ZETIA 10 MG tablet  Generic drug:  ezetimibe  TAKE 1 TABLET BY MOUTH EVERY DAY           Objective:   Physical Exam BP 135/80  Pulse 66  Temp(Src) 97.5 F (36.4 C) (Oral)  Wt 217 lb (98.431 kg)  SpO2 95%  General -- alert, well-developed, NAD.  HEENT-- Not pale. TMs obscured by wax, no d/c. throat symmetric, no redness or discharge. Face symmetric, sinuses not tender to palpation. Nose slt congested.  Lungs -- normal respiratory effort, no intercostal retractions, no accessory muscle use, and normal breath sounds.  Heart-- normal rate, regular rhythm, no murmur.  Neurological--speech gait and  motor are baseline Psych--  No anxious or depressed appearing.       Assessment & Plan:   Cough, likely bronchitis. See instructions

## 2013-11-29 NOTE — Patient Instructions (Addendum)
Rest, fluids , tylenol For cough, take Mucinex DM twice a day as needed  If chest congestion, use albuterol 2 puffs every 6 hours as needed For congestion use OTC Nasocort: 2 nasal sprays on each side of the nose daily until you feel better Take the antibiotic as prescribed  (zithromax ) If not improving in the next 2 days If you decode to take Zithromax, your Coumadin needs to be checked sooner than usual. Call if no better in 1 week   Please remember your coumadin  needs to be checked from time to time and results faxed to cardiology

## 2013-11-30 ENCOUNTER — Telehealth: Payer: Self-pay

## 2013-11-30 ENCOUNTER — Encounter: Payer: Self-pay | Admitting: Internal Medicine

## 2013-11-30 NOTE — Telephone Encounter (Signed)
Received request via fax from [facility] for a diet order for [employer].  The order was placed in Dr. Ethel Rana red folder.

## 2013-11-30 NOTE — Telephone Encounter (Signed)
Order received via fax from [facility] requesting clarification on the start date for azithromycin.  Order placed in Dr. Ethel Rana red folder.

## 2013-12-01 NOTE — Telephone Encounter (Signed)
Start today or tomorrow if   still has respiratory symptoms. Will need sooner INR check if she takes antibiotics, call cardiology 3 days after abx initiation

## 2013-12-01 NOTE — Telephone Encounter (Signed)
Orders faxed -[facility].

## 2013-12-06 NOTE — Progress Notes (Signed)
OT addendum   09/19/13 1122  OT Time Calculation  OT Start Time 1045  OT Stop Time 1109  OT Time Calculation (min) 24 min  OT G-codes **NOT FOR INPATIENT CLASS**  Functional Assessment Tool Used clinical judgment  Functional Limitation Self care  Self Care Current Status (S8546) CI  Self Care Goal Status (E7035) CI  OT General Charges  $OT Visit 1 Procedure  OT Evaluation  $Initial OT Evaluation Tier I 1 Procedure  OT Treatments  $Self Care/Home Management  8-22 mins    Roseanne Reno, OTR/L

## 2013-12-06 NOTE — Progress Notes (Signed)
09/19/13 1500  SLP Visit Information  SLP Received On 09/19/13  SLP Time Calculation  SLP Start Time 1250  SLP Stop Time 1312  SLP Time Calculation (min) 22 min  Subjective  Subjective Pt. alert and cooperative. C/O being tired.  Patient/Family Stated Goal None  General Information  HPI Jerre Diguglielmo is a 78 y.o. female with h/o asthma, copd, DVT, PE, hypertension came in for generalized weakness, left arm tingling since yesterday. She also reports she felt on the cat litter box. She doesn't remember if she lost conscious ness . She lives by herself in [place] and her family lives in [place]. Family is concerned that she is not safe at home by herself and she had multiple falls in the past.  On arrival to ED, she was evaluate dfor TIA, and a CT head was obtained , it was negative for any acute intracranial findings. It was followed with an MRI OF THE head , which was negative for acute stroke. She is referred to medical service for admission, for further work up for [place].    Prior Functional Status  Cognitive/Linguistic Baseline Information not available  Type of Home House  Lives With Alone  Cognition  Overall Cognitive Status Within Functional Limits for tasks assessed  Arousal/Alertness Awake/alert  Orientation Level Oriented X4  Auditory Comprehension  Overall Auditory Comprehension Appears within functional limits for tasks assessed  Yes/No Questions WFL  Commands WFL  Conversation Simple  Expression  Primary Mode of Expression Verbal  Verbal Expression  Overall Verbal Expression Appears within functional limits for tasks assessed  Initiation No impairment  Written Expression  Dominant Hand Right  Oral Motor/Sensory Function  Overall Oral Motor/Sensory Function Appears within functional limits for tasks assessed  Motor Speech  Overall Motor Speech Appears within functional limits for tasks assessed  Respiration WFL  Phonation Normal  Resonance [facility]  Articulation [facility]   Intelligibility Intelligible  Motor Planning [facility]  Motor Speech Errors NA  SLP - End of Session  Patient left in bed;with call bell/phone within reach  Assessment  Clinical Impression Statement Patient appears to have functional speech and language as well as basic cognitive skills.  Pt. states "I can't remember things like I used to, but that's just old age."    SLP Recommendation/Assessment All further Speech Lanaguage Pathology  needs can be addressed in the next venue of care  SLP Recommendations  Follow up Recommendations Skilled Nursing facility  SLP Equipment None recommended by SLP  Individuals Consulted  Consulted and Agree with Results and Recommendations Patient  SLP G-Codes **NOT FOR INPATIENT CLASS**  Functional Assessment Tool Used skilled clinical judgement via chart review  Functional Limitations Spoken language expressive  Spoken Language Expression Current Status [phone]) [place]  Spoken Language Expression Goal Status (I4332) [place]  Spoken Language Expression Discharge Status (R5188) [place]  SLP Evaluations  $ SLP Speech Visit 1 Procedure  SLP Evaluations  $ SLP EVAL LANGUAGE/SOUND PRODUCTION 1 Procedure  Gabriel Rainwater MA, CCC-SLP [phone]

## 2013-12-07 ENCOUNTER — Telehealth: Payer: Self-pay

## 2013-12-07 NOTE — Telephone Encounter (Signed)
Received request from [facility] to prescribe coumadin orders. Per Dr Larose Kells cardiology follows coumadin. Notified Lelon Frohlich) that coumadin orders followed by cardiologist. States understanding.

## 2013-12-21 ENCOUNTER — Telehealth: Payer: Self-pay | Admitting: *Deleted

## 2013-12-21 NOTE — Telephone Encounter (Signed)
Received paperwork via fax from [facility] for attestation of medical need. Forms filled out as much as possible, billing sheet attached and placed in folder for Dr. Larose Kells. JG//CMA

## 2013-12-25 NOTE — Telephone Encounter (Signed)
Signed documents faxed to [facility] at [phone]

## 2014-01-08 ENCOUNTER — Ambulatory Visit: Payer: Medicare Other | Admitting: Internal Medicine

## 2014-01-09 ENCOUNTER — Telehealth: Payer: Self-pay | Admitting: Internal Medicine

## 2014-01-09 NOTE — Telephone Encounter (Signed)
I do not see incontinence in her history list. Is this ok to order?

## 2014-01-09 NOTE — Telephone Encounter (Signed)
Caller name: [name] Relation to pt: guardian Call back number: [phone]   Reason for call:   Pt is LTC, [facility], and they are stating that pt is incontinent and before they can order pads/diapers, they are going to need a letter or order stating that she is incontinent.   Please contact [name] at listed above.  Thanks.

## 2014-01-10 NOTE — Telephone Encounter (Signed)
Please ask [name], if she is incontinent per family report then okay to give the other

## 2014-01-11 NOTE — Telephone Encounter (Signed)
Per caregiver, patient has had problems for some time. She can make it sometimes but she is moving very slow and has a hard time getting to the toilet.  Order entered. Signed and faxed to [facility] [phone]

## 2014-01-15 ENCOUNTER — Ambulatory Visit (INDEPENDENT_AMBULATORY_CARE_PROVIDER_SITE_OTHER): Payer: Medicare Other | Admitting: Neurology

## 2014-01-15 ENCOUNTER — Encounter: Payer: Self-pay | Admitting: Neurology

## 2014-01-15 VITALS — BP 130/76 | HR 70 | Temp 97.7°F | Ht 65.5 in | Wt 222.9 lb

## 2014-01-15 DIAGNOSIS — R259 Unspecified abnormal involuntary movements: Secondary | ICD-10-CM

## 2014-01-15 DIAGNOSIS — G252 Other specified forms of tremor: Secondary | ICD-10-CM

## 2014-01-15 DIAGNOSIS — R251 Tremor, unspecified: Secondary | ICD-10-CM

## 2014-01-15 DIAGNOSIS — G5600 Carpal tunnel syndrome, unspecified upper limb: Secondary | ICD-10-CM

## 2014-01-15 DIAGNOSIS — G250 Essential tremor: Secondary | ICD-10-CM

## 2014-01-15 MED ORDER — CARVEDILOL 12.5 MG PO TABS: ORAL_TABLET | ORAL | Status: DC

## 2014-01-15 NOTE — Patient Instructions (Addendum)
1. Increase Coreg 12.5mg : Take 1 tablet twice a day 2. Monitor BP with increase in Coreg dose 3. Wear wrist splint on left wrist 4. Follow-up in 3 months

## 2014-01-15 NOTE — Progress Notes (Addendum)
NEUROLOGY FOLLOW UP OFFICE NOTE  Kassaundra Hair [id number]  HISTORY OF PRESENT ILLNESS: I had the pleasure of seeing Dawn Morrow in follow-up in the neurology clinic on 01/15/2014.  The patient was last seen 3 months ago for left hand numbness and tremors.  She is accompanied by her niece today. Since her last visit, she has moved into an assisted living facility and is happy with this.  She reports that the tremors in her hands get worse sometimes.  Her peas "fall over the place" but she "manages" when eating.  She has tried weighted silverware but reports these are too heavy.  If she takes her time, handwriting is "not too bad."  She has rare leg tremors and states this does not affect walking with the walker.  She denies any falls, she has been told she needs to slow down using her walker.  She continues to have left hand numbness up to the wrist.  She crochets a lot.  No head tremors.  No difficulty chewing or swallowing.   HPI:  This is a pleasant 78 yo RH woman with multiple medical problems, including a history of asthma, COPD, DVT and PE on chronic anticoagulation, tremors, who was admitted to [facility] from January 12-15, 2015 when she fell at home with generalized weakness and left arm tingling. On admission, INR was 1.87. She had an MRI brain which I personally reviewed, no acute changes, moderate chronic small vessel ischemic disease. Remote lacunar infarct in the left putamen with associated susceptibility artifact suggestive of remote hemorrhage. There is mild to moderate cerebral atrophy. 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 Echo unremarakable, Carotid Dopplers with no significant ICA stenosis. LDL is 79. TSH is 3.05. She was resumed on Coumadin, last INR on discharge 09/21/12 was 2.43. She was noted to be hypertensive, Coreg and lisinopril were restarted, with note of improvement.  Hemoglobin A1c was 6.6.   Since hospital discharge, she reported that her left hand continues to be numb. It does not affect the forearm/arm. Her left foot is numb as well. She denies any pain. No facial involvement. She also has bilateral hand tremors, which she reports started at age 78 or 78. 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 Occasionally her legs feels shaky when lying in bed. She is adopted and unaware of family history of tremors, however her grandson and great grandson have tremors.   PAST MEDICAL HISTORY: Past Medical History  Diagnosis Date  . Asthma   . COPD (chronic obstructive pulmonary disease)   . DVT (deep vein thrombosis) in pregnancy     hx x multiple on coumadin  . Pulmonary embolism     x 2  . GERD (gastroesophageal reflux disease)   . Hypertension   . Osteopenia   . Hypothyroidism   . Hyperlipidemia   . Osteoarthritis     h/o spinal stenosis by MRI 2009  . Hiatal hernia   . Diverticulosis   . Hemorrhoids   . Tubular adenoma of colon 07/2005  . Anxiety and depression   . Cataract   . Stroke     MINI  . Diabetes mellitus     MEDICATIONS: Current Outpatient Prescriptions on File Prior to Visit  Medication Sig Dispense Refill  . albuterol (  VENTOLIN HFA) 108 (90 BASE) MCG/ACT inhaler Inhale 2 puffs into the lungs every 4 (four) hours as needed.  1 Inhaler  6  . aspirin 81 MG tablet Take 81 mg by mouth daily.      Marland Kitchen aspirin-acetaminophen-caffeine (EXCEDRIN MIGRAINE) 250-250-65 MG per tablet Take 2 tablets by mouth every 6 (six) hours as needed for headache.      Marland Kitchen atorvastatin (LIPITOR) 40 MG tablet Take 40 mg by mouth daily.      Marland Kitchen azithromycin (ZITHROMAX Z-PAK) 250 MG tablet As directed  6 each  0  . buPROPion (WELLBUTRIN XL) 300 MG 24 hr tablet Take 300 mg by mouth daily.      Marland Kitchen levothyroxine (SYNTHROID, LEVOTHROID) 150 MCG tablet Take  150 mcg by mouth daily.      Marland Kitchen lisinopril (PRINIVIL,ZESTRIL) 10 MG tablet Take 1 tablet (10 mg total) by mouth daily.  30 tablet  6  . meclizine (ANTIVERT) 25 MG tablet Take 1 tablet (25 mg total) by mouth at bedtime.  30 tablet  6  . warfarin (COUMADIN) 6 MG tablet TAKE AS DIRECTED BY COUMADIN CLINIC  45 tablet  0  . ZETIA 10 MG tablet TAKE 1 TABLET BY MOUTH EVERY DAY  90 tablet  0   Current Facility-Administered Medications on File Prior to Visit  Medication Dose Route Frequency Provider Last Rate Last Dose  . zoledronic acid (RECLAST) injection 5 mg  5 mg Intravenous Once Colon Branch, MD        ALLERGIES: Allergies  Allergen Reactions  . Penicillins Anaphylaxis  . Codeine Other (See Comments)    unknown  . Sulfonamide Derivatives Other (See Comments)    unknown    FAMILY HISTORY: Family History  Problem Relation Age of Onset  . Adopted: Yes  . Cancer Mother     ? colon or ovarian  . Diabetes Mother   . Cancer Brother     ?    SOCIAL HISTORY: History   Social History  . Marital Status: Divorced    Spouse Name: N/A    Number of Children: 3  . Years of Education: N/A   Occupational History  . retired    Social History Main Topics  . Smoking status: Former Smoker    Types: Cigarettes  . Smokeless tobacco: Never Used  . Alcohol Use: No     Comment: socially  . Drug Use: No  . Sexual Activity: Not on file   Other Topics Concern  . Not on file   Social History Narrative   Single,   lost her sister in 2012. 3 children , all live in [place] to [place] late 10-2013   [employer], lives in [place]; nice [place] in [place] [phone], [phone])   Uses a diaper.     Doesn't drive      REVIEW OF SYSTEMS: Constitutional: No fevers, chills, or sweats, no generalized fatigue, change in appetite Eyes: No visual changes, double vision, eye pain Ear, nose and throat: No hearing loss, ear pain, nasal congestion, sore throat Cardiovascular: No chest  pain, palpitations Respiratory:  No shortness of breath at rest or with exertion, wheezes GastrointestinaI: No nausea, vomiting, diarrhea, abdominal pain, fecal incontinence Genitourinary:  No dysuria, urinary retention or frequency Musculoskeletal:  No neck pain, back pain Integumentary: No rash, pruritus, skin lesions Neurological: as above Psychiatric: No depression, insomnia, anxiety Endocrine: No palpitations, fatigue, diaphoresis, mood swings, change in appetite, change in weight, increased thirst Hematologic/Lymphatic:  No anemia, purpura, petechiae. Allergic/Immunologic: no itchy/runny eyes, nasal congestion, recent allergic reactions, rashes  PHYSICAL EXAM: Filed Vitals:   01/15/14 1103  BP: 130/76  Pulse: 70  Temp: 97.7 F (36.5 C)   General: No acute distress Head:  Normocephalic/atraumatic Neck: supple, no paraspinal tenderness, full range of motion Heart:  Regular rate and rhythm Lungs:  Clear to auscultation bilaterally Back: No paraspinal tenderness Skin/Extremities: No rash, no edema Neurological Exam:  Mental status: alert and oriented to person, place, and time, no dysarthria or dysphagia, Fund of knowledge is appropriate. Recent and remote memory are intact. Attention and concentration are normal. Able to name objects and repeat phrases.  Cranial nerves:  CN I: not tested  CN II: pupils equal, round and reactive to light, visual fields intact, fundi unremarkable.  CN III, IV, VI: full range of motion, no nystagmus, no ptosis  CN V: intact facial sensation CN VII: upper and lower face symmetric  CN VIII: hearing intact  CN IX, X: gag intact, uvula midline  CN XI: sternocleidomastoid and trapezius muscles intact  CN XII: tongue midline  Bulk & Tone: normal, no cogwheeling, no fasciculations  Motor: 5/5 throughout with no pronator drift, good fine finger movements Sensation: intact to light touch Deep Tendon Reflexes: +2 throughout except for absent ankle  jerks bilaterally, no clonus  Plantar responses: downgoing bilaterally  Finger to nose testing: no incoordination with note of bilateral endpoint tremor (worse today), left greater than right Gait: ambulated with walker, no ataxia Tremors: No resting tremor. Note of bilateral endpoint and action tremor in the arms > postural tremor Negative Phalen's and Tinel's sign on left wrist.  IMPRESSION: This is a pleasant 78 yo RH woman with multiple medical problems including DVT and PE on chronic anticoagulation, with essential tremor and left hand numbness suggestive of carpal tunnel syndrome.  No radicular pain.  She was again advised to wear a wrist splint. She reports the tremors are worse at times, and will increase beta-blocker dose of Coreg 12.5mg  to BID dosing.  Side effects were discussed, I have asked the facility to monitor her BP with change in dose.  She also has a small left MCA aneurysm, low annual risk of rupture, we discussed control of blood pressure and re-imaging in a year. Fall and gait precautions were again discussed. She expressed understanding and will follow-up in 3 months.  Thank you for allowing me to participate in her care.  Please do not hesitate to call for any questions or concerns.  The duration of this appointment visit was 15 minutes of face-to-face time with the patient.  Greater than 50% of this time was spent in counseling, explanation of diagnosis, planning of further management, and coordination of care.   Ellouise Newer, M.D.

## 2014-01-16 ENCOUNTER — Telehealth: Payer: Self-pay | Admitting: *Deleted

## 2014-01-16 ENCOUNTER — Encounter: Payer: Self-pay | Admitting: Neurology

## 2014-01-16 NOTE — Telephone Encounter (Signed)
Received order from [facility] requesting supplies for incontinence. Form filled out, signed by Dr. Larose Kells and faxed to [facility] at [phone]. JG//CMA

## 2014-01-19 NOTE — Telephone Encounter (Signed)
Faxed signed paperwork 11/15/2013. JG//CMA

## 2014-01-19 NOTE — Telephone Encounter (Signed)
Signed order faxed 11/20/2013. JG//CMA

## 2014-01-19 NOTE — Telephone Encounter (Signed)
Faxed completed paperwork 11/14/2013. JG//CMA

## 2014-02-07 ENCOUNTER — Telehealth: Payer: Self-pay | Admitting: Internal Medicine

## 2014-02-07 NOTE — Telephone Encounter (Signed)
Pt's daughter called to see if we received form from [facility] in regards to RX for diapers.  Informed daughter of status.

## 2014-02-08 LAB — POCT INR: INR: 2.7

## 2014-02-09 ENCOUNTER — Telehealth: Payer: Self-pay | Admitting: *Deleted

## 2014-02-09 NOTE — Telephone Encounter (Signed)
Received fax from [facility] with PT/INR results for patient. PT was 29.7 and INR was 2.7. Results forwarded to Dr. Larose Kells. JG//CMA

## 2014-02-12 NOTE — Telephone Encounter (Signed)
Spoke with Ronny Bacon at [facility] where pt resides. Advised them that Cardiology is dosing the coumadin and that all PT/INR results be faxed there, all contact numbers provided to them.

## 2014-02-13 ENCOUNTER — Ambulatory Visit (INDEPENDENT_AMBULATORY_CARE_PROVIDER_SITE_OTHER): Payer: Medicare Other | Admitting: General Practice

## 2014-02-13 DIAGNOSIS — I82409 Acute embolism and thrombosis of unspecified deep veins of unspecified lower extremity: Secondary | ICD-10-CM

## 2014-02-13 DIAGNOSIS — Z7901 Long term (current) use of anticoagulants: Secondary | ICD-10-CM

## 2014-02-13 DIAGNOSIS — I2699 Other pulmonary embolism without acute cor pulmonale: Secondary | ICD-10-CM

## 2014-02-13 NOTE — Progress Notes (Signed)
Pre visit review using our clinic review tool, if applicable. No additional management support is needed unless otherwise documented below in the visit note. 

## 2014-03-07 ENCOUNTER — Telehealth: Payer: Self-pay | Admitting: *Deleted

## 2014-03-07 NOTE — Telephone Encounter (Signed)
Received [place] [facility] request for prior Approval CMN/PA paperwork via fax from [facility].  Billing sheet attached and placed in folder for Dr. Larose Kells to complete and sign.//AB/CMA

## 2014-03-08 DIAGNOSIS — Z0279 Encounter for issue of other medical certificate: Secondary | ICD-10-CM

## 2014-03-08 LAB — PROTIME-INR: INR: 5.5 — AB (ref 0.9–1.1)

## 2014-03-14 ENCOUNTER — Telehealth: Payer: Self-pay | Admitting: General Practice

## 2014-03-14 ENCOUNTER — Ambulatory Visit (INDEPENDENT_AMBULATORY_CARE_PROVIDER_SITE_OTHER): Payer: Medicare Other | Admitting: General Practice

## 2014-03-14 DIAGNOSIS — I2699 Other pulmonary embolism without acute cor pulmonale: Secondary | ICD-10-CM

## 2014-03-14 DIAGNOSIS — Z5181 Encounter for therapeutic drug level monitoring: Secondary | ICD-10-CM | POA: Insufficient documentation

## 2014-03-14 DIAGNOSIS — Z7901 Long term (current) use of anticoagulants: Secondary | ICD-10-CM

## 2014-03-14 NOTE — Progress Notes (Signed)
Pre visit review using our clinic review tool, if applicable. No additional management support is needed unless otherwise documented below in the visit note. 

## 2014-03-14 NOTE — Telephone Encounter (Signed)
Faxed coumadin orders to [facility] @ [facility] [place].

## 2014-03-15 NOTE — Telephone Encounter (Signed)
Received completed and signed [place] [facility] request for prior approval CMN.PA form from Dr. Larose Kells.   All forms faxed to [phone]).  Confirmation received.//AB/CMA

## 2014-03-16 ENCOUNTER — Ambulatory Visit (INDEPENDENT_AMBULATORY_CARE_PROVIDER_SITE_OTHER): Payer: Medicare Other | Admitting: Family Medicine

## 2014-03-16 DIAGNOSIS — Z5181 Encounter for therapeutic drug level monitoring: Secondary | ICD-10-CM

## 2014-03-16 DIAGNOSIS — I2699 Other pulmonary embolism without acute cor pulmonale: Secondary | ICD-10-CM

## 2014-03-16 LAB — POCT INR: INR: 3.1

## 2014-03-23 ENCOUNTER — Encounter: Payer: Self-pay | Admitting: Gastroenterology

## 2014-04-10 ENCOUNTER — Telehealth: Payer: Self-pay | Admitting: *Deleted

## 2014-04-10 NOTE — Telephone Encounter (Signed)
Received Personal Service Plan,[place] PSP Addendum [facility] PSA/PSP Signature Page paperwork via fax from [facility].  Placed in Dr. Larose Kells folder to review and sign.//AB/CMA

## 2014-04-17 ENCOUNTER — Ambulatory Visit (INDEPENDENT_AMBULATORY_CARE_PROVIDER_SITE_OTHER): Payer: Medicare Other | Admitting: Family Medicine

## 2014-04-17 DIAGNOSIS — Z5181 Encounter for therapeutic drug level monitoring: Secondary | ICD-10-CM

## 2014-04-17 DIAGNOSIS — I2699 Other pulmonary embolism without acute cor pulmonale: Secondary | ICD-10-CM

## 2014-04-17 LAB — POCT INR: INR: 3.2

## 2014-04-18 ENCOUNTER — Encounter: Payer: Self-pay | Admitting: Neurology

## 2014-04-18 ENCOUNTER — Ambulatory Visit (INDEPENDENT_AMBULATORY_CARE_PROVIDER_SITE_OTHER): Payer: Medicare Other | Admitting: Neurology

## 2014-04-18 VITALS — BP 124/70 | HR 62 | Ht 65.0 in | Wt 230.0 lb

## 2014-04-18 DIAGNOSIS — G5600 Carpal tunnel syndrome, unspecified upper limb: Secondary | ICD-10-CM

## 2014-04-18 DIAGNOSIS — R259 Unspecified abnormal involuntary movements: Secondary | ICD-10-CM

## 2014-04-18 DIAGNOSIS — R251 Tremor, unspecified: Secondary | ICD-10-CM

## 2014-04-18 DIAGNOSIS — G5602 Carpal tunnel syndrome, left upper limb: Secondary | ICD-10-CM

## 2014-04-18 MED ORDER — GABAPENTIN 300 MG PO CAPS: ORAL_CAPSULE | ORAL | Status: DC

## 2014-04-18 NOTE — Progress Notes (Signed)
NEUROLOGY FOLLOW UP OFFICE NOTE  Dawn Morrow [id number]  HISTORY OF PRESENT ILLNESS: I had the pleasure of seeing Dawn Morrow in follow-up in the neurology clinic on 0/08/2014.  The patient was last seen 3 months ago for tremors and left hand numbness.  Since her last visit, she reports the numbness in her left palm is worse.  It is constant, stops at the wrist, no wrist pain. She did not get the wrist splint recommended on last visit.  She is able to crochet without difficulty.  She denies any neck pain.  She does not feel the tremors have worsened. On her last visit, beta-blocker dose was increased to 12.5mg  BID. She is able to feed herself, but does spill her peas and drink sometimes.  She has rare leg tremors but does not affect ambulating with walker.  She denies any dizziness, frequent headaches, dysarthria, dysphagia, diplopia.  No right hand symptoms. She has rare numbness in her feet.  She always has back pain, no bowel/bladder dysfunction.  HPI: This is a pleasant 78 yo RH woman with multiple medical problems, including a history of asthma, COPD, DVT and PE on chronic anticoagulation, tremors, who was admitted to [facility] from January 12-15, 2015 when she fell at home with generalized weakness and left arm tingling. On admission, INR was 1.87. She had an MRI brain which I personally reviewed, no acute changes, moderate chronic small vessel ischemic disease. Remote lacunar infarct in the left putamen with associated susceptibility artifact suggestive of remote hemorrhage. There is mild to moderate cerebral atrophy. 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 Echo unremarakable, Carotid Dopplers with no significant ICA stenosis. LDL is 79. TSH is 3.05. She was resumed on Coumadin, last INR on discharge 09/21/12 was 2.43. She was noted to be hypertensive, Coreg and lisinopril were restarted, with  note of improvement. Hemoglobin A1c was 6.6.   Since hospital discharge, she reported that her left hand continues to be numb. It does not affect the forearm/arm. Her left foot is numb as well. She denies any pain. No facial involvement. She also has bilateral hand tremors, which she reports started at age 78 or 78. 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 Occasionally her legs feels shaky when lying in bed. She is adopted and unaware of family history of tremors, however her grandson and great grandson have tremors.   PAST MEDICAL HISTORY: Past Medical History  Diagnosis Date  . Asthma   . COPD (chronic obstructive pulmonary disease)   . DVT (deep vein thrombosis) in pregnancy     hx x multiple on coumadin  . Pulmonary embolism     x 2  . GERD (gastroesophageal reflux disease)   . Hypertension   . Osteopenia   . Hypothyroidism   . Hyperlipidemia   . Osteoarthritis     h/o spinal stenosis by MRI 2009  . Hiatal hernia   . Diverticulosis   . Hemorrhoids   . Tubular adenoma of colon 07/2005  . Anxiety and depression   . Cataract   . Stroke     MINI  . Diabetes mellitus     MEDICATIONS: Current Outpatient Prescriptions on File Prior to Visit  Medication Sig Dispense Refill  . albuterol (VENTOLIN HFA) 108 (90 BASE)  MCG/ACT inhaler Inhale 2 puffs into the lungs every 4 (four) hours as needed.  1 Inhaler  6  . aspirin 81 MG tablet Take 81 mg by mouth daily.      Marland Kitchen aspirin-acetaminophen-caffeine (EXCEDRIN MIGRAINE) 250-250-65 MG per tablet Take 2 tablets by mouth every 6 (six) hours as needed for headache.      Marland Kitchen atorvastatin (LIPITOR) 40 MG tablet Take 40 mg by mouth daily.      Marland Kitchen buPROPion (WELLBUTRIN XL) 300 MG 24 hr tablet Take 300 mg by mouth daily.      . carvedilol (COREG) 12.5 MG tablet Take 1 tablet twice a day  180 tablet  3  . levothyroxine (SYNTHROID,  LEVOTHROID) 150 MCG tablet Take 150 mcg by mouth daily.      Marland Kitchen lisinopril (PRINIVIL,ZESTRIL) 10 MG tablet Take 1 tablet (10 mg total) by mouth daily.  30 tablet  6  . meclizine (ANTIVERT) 25 MG tablet Take 1 tablet (25 mg total) by mouth at bedtime.  30 tablet  6  . warfarin (COUMADIN) 6 MG tablet TAKE AS DIRECTED BY COUMADIN CLINIC  45 tablet  0  . ZETIA 10 MG tablet TAKE 1 TABLET BY MOUTH EVERY DAY  90 tablet  0   Current Facility-Administered Medications on File Prior to Visit  Medication Dose Route Frequency Provider Last Rate Last Dose  . zoledronic acid (RECLAST) injection 5 mg  5 mg Intravenous Once Colon Branch, MD        ALLERGIES: Allergies  Allergen Reactions  . Penicillins Anaphylaxis  . Codeine Other (See Comments)    unknown  . Sulfonamide Derivatives Other (See Comments)    unknown    FAMILY HISTORY: Family History  Problem Relation Age of Onset  . Adopted: Yes  . Cancer Mother     ? colon or ovarian  . Diabetes Mother   . Cancer Brother     ?    SOCIAL HISTORY: History   Social History  . Marital Status: Divorced    Spouse Name: N/A    Number of Children: 3  . Years of Education: N/A   Occupational History  . retired    Social History Main Topics  . Smoking status: Former Smoker    Types: Cigarettes  . Smokeless tobacco: Never Used  . Alcohol Use: No     Comment: socially  . Drug Use: No  . Sexual Activity: Not on file   Other Topics Concern  . Not on file   Social History Narrative   Single,   lost her sister in 2012. 3 children , all live in [place] to [place] late 10-2013   [employer], lives in [place]; nice [place] in [place] [phone], [phone])   Uses a diaper.     Doesn't drive      REVIEW OF SYSTEMS: Constitutional: No fevers, chills, or sweats, no generalized fatigue, change in appetite Eyes: No visual changes, double vision, eye pain Ear, nose and throat: No hearing loss, ear pain, nasal congestion, sore  throat Cardiovascular: No chest pain, palpitations Respiratory:  No shortness of breath at rest or with exertion, wheezes GastrointestinaI: No nausea, vomiting, diarrhea, abdominal pain, fecal incontinence Genitourinary:  No dysuria, urinary retention or frequency Musculoskeletal:  No neck pain, +back pain Integumentary: No rash, pruritus, skin lesions Neurological: as above Psychiatric: No depression, insomnia, anxiety Endocrine: No palpitations, fatigue, diaphoresis, mood swings, change in appetite, change in weight, increased thirst Hematologic/Lymphatic:  No  anemia, purpura, petechiae. Allergic/Immunologic: no itchy/runny eyes, nasal congestion, recent allergic reactions, rashes  PHYSICAL EXAM: Filed Vitals:   04/18/14 1051  BP: 124/70  Pulse: 62   General: No acute distress Head:  Normocephalic/atraumatic Neck: supple, no paraspinal tenderness, full range of motion Heart:  Regular rate and rhythm Lungs:  Clear to auscultation bilaterally Back: No paraspinal tenderness Skin/Extremities: No rash, no edema Neurological Exam: alert and oriented to person, place, and time, no dysarthria or dysphagia, Fund of knowledge is appropriate. Recent and remote memory are intact. Attention and concentration are normal. Able to name objects and repeat phrases.  Cranial nerves:  CN I: not tested  CN II: pupils equal, round and reactive to light, visual fields intact, fundi unremarkable.  CN III, IV, VI: full range of motion, no nystagmus, no ptosis  CN V: intact facial sensation  CN VII: upper and lower face symmetric  CN VIII: hearing intact  CN IX, X: gag intact, uvula midline  CN XI: sternocleidomastoid and trapezius muscles intact  CN XII: tongue midline  Bulk & Tone: normal, no cogwheeling, no fasciculations  Motor: 5/5 throughout with no pronator drift, good fine finger movements  Sensation: intact to light touch  Deep Tendon Reflexes: +2 throughout except for absent ankle jerks  bilaterally, no clonus  Plantar responses: downgoing bilaterally  Finger to nose testing: no incoordination with note of bilateral coarse endpoint tremor (similar to last visit) Gait: ambulated with walker, no ataxia  Tremors: No resting tremor. Note of bilateral endpoint and action tremor in the arms > postural tremor, jaw tremor with distraction Negative Phalen's and Tinel's sign on left wrist.   IMPRESSION:  This is a pleasant 78 yo RH woman with multiple medical problems including DVT and PE on chronic anticoagulation, with essential tremor and left hand numbness suggestive of carpal tunnel syndrome. No radicular pain. She reports the numbness in her left palm is worse. She was again advised to wear a wrist splint, Rx given today. She will start gabapentin 300mg  qhs x 1 week, then increase to 300mg  BID for neuropathy and tremors.  She reports the tremors are unchanged, noted to have coarse bilateral action tremor and jaw tremor today.  Side effects of Gabapentin were discussed.  We discussed if wrist splint and gabapentin are ineffective, she will be scheduled for EMG/NCV on her next visit. She also has a small left MCA aneurysm, low annual risk of rupture, we discussed control of blood pressure and re-imaging in a year. Fall and gait precautions were again discussed. She expressed understanding and will follow-up in 3 months.  Thank you for allowing me to participate in her care.  Please do not hesitate to call for any questions or concerns.  The duration of this appointment visit was 25 minutes of face-to-face time with the patient.  Greater than 50% of this time was spent in counseling, explanation of diagnosis, planning of further management, and coordination of care.   Ellouise Newer, M.D.   CC: Dr. Larose Kells

## 2014-04-18 NOTE — Patient Instructions (Signed)
1. Start Gabapentin 300mg : Take 1 cap at bedtime for 1 week, then increase to 1 capsule twice a day 2. Start using left wrist splint daily 3. Follow-up in 3 months

## 2014-04-23 NOTE — Telephone Encounter (Signed)
Paperwork completed and faxed.  °

## 2014-05-11 ENCOUNTER — Ambulatory Visit (INDEPENDENT_AMBULATORY_CARE_PROVIDER_SITE_OTHER): Payer: Medicare Other | Admitting: *Deleted

## 2014-05-11 DIAGNOSIS — I2699 Other pulmonary embolism without acute cor pulmonale: Secondary | ICD-10-CM

## 2014-05-11 DIAGNOSIS — Z5181 Encounter for therapeutic drug level monitoring: Secondary | ICD-10-CM

## 2014-05-11 LAB — POCT INR: INR: 2

## 2014-05-23 ENCOUNTER — Telehealth: Payer: Self-pay

## 2014-05-23 NOTE — Telephone Encounter (Signed)
Received OT Form for Pt from [facility] for Dr. Larose Kells to complete. Dr. Larose Kells has signed form and completed form faxed back to [facility].

## 2014-05-31 ENCOUNTER — Telehealth: Payer: Self-pay

## 2014-05-31 NOTE — Telephone Encounter (Signed)
Received fax from [facility] requesting authorization for Pt to receive flu shot. Form completed by Dr. Larose Kells and faxed back to [facility]. Form placed in scanning folder to be scanned.

## 2014-05-31 NOTE — Telephone Encounter (Signed)
Received fax from [facility] regarding a walker for Pt. Form completed by Dr. Larose Kells and faxed back to [facility]. Form placed in scanning folder to be scanned.

## 2014-06-12 ENCOUNTER — Ambulatory Visit (INDEPENDENT_AMBULATORY_CARE_PROVIDER_SITE_OTHER): Payer: Medicare Other | Admitting: Internal Medicine

## 2014-06-12 ENCOUNTER — Encounter: Payer: Self-pay | Admitting: Internal Medicine

## 2014-06-12 VITALS — BP 173/71 | HR 67 | Temp 97.5°F | Wt 232.1 lb

## 2014-06-12 DIAGNOSIS — E034 Atrophy of thyroid (acquired): Secondary | ICD-10-CM

## 2014-06-12 DIAGNOSIS — Z7901 Long term (current) use of anticoagulants: Secondary | ICD-10-CM

## 2014-06-12 DIAGNOSIS — I10 Essential (primary) hypertension: Secondary | ICD-10-CM

## 2014-06-12 DIAGNOSIS — E1159 Type 2 diabetes mellitus with other circulatory complications: Secondary | ICD-10-CM

## 2014-06-12 DIAGNOSIS — E038 Other specified hypothyroidism: Secondary | ICD-10-CM

## 2014-06-12 DIAGNOSIS — J449 Chronic obstructive pulmonary disease, unspecified: Secondary | ICD-10-CM

## 2014-06-12 DIAGNOSIS — N390 Urinary tract infection, site not specified: Secondary | ICD-10-CM

## 2014-06-12 LAB — URINALYSIS, ROUTINE W REFLEX MICROSCOPIC
Bilirubin Urine: NEGATIVE
Ketones, ur: NEGATIVE
Nitrite: NEGATIVE
Specific Gravity, Urine: 1.015 (ref 1.000–1.030)
Total Protein, Urine: NEGATIVE
Urine Glucose: NEGATIVE
Urobilinogen, UA: 0.2 (ref 0.0–1.0)
pH: 5.5 (ref 5.0–8.0)

## 2014-06-12 LAB — HEMOGLOBIN A1C: Hgb A1c MFr Bld: 7.6 % — ABNORMAL HIGH (ref 4.6–6.5)

## 2014-06-12 MED ORDER — AZITHROMYCIN 250 MG PO TABS: ORAL_TABLET | ORAL | Status: DC

## 2014-06-12 NOTE — Patient Instructions (Signed)
Get your blood work before you leave   Rest, fluids , tylenol For cough, take Mucinex DM twice a day as needed  Use albuterol as prescribed if the cough is persistent Take the antibiotic as prescribed  (zithromax) Call if not gradually better over the next 3-4 days Call anytime if the symptoms are severe   Your coumadin t needs to be checked within the next few days, you are taking an antibiotic that may affect the Coumadin level   Please come back to the office in 3 months  for a physical exam.

## 2014-06-12 NOTE — Assessment & Plan Note (Signed)
I'm prescribing an antibiotic today, I notice she is due for an INR check. I asked her and the facility she lives at to get an appointment and get her Coumadin check. Also sent a message to the Coumadin clinic

## 2014-06-12 NOTE — Progress Notes (Signed)
Subjective:    Patient ID: Dawn Morrow, female    DOB: March 18, 1936, 78 y.o.   MRN: [id number]  DOS:  06/12/2014 Type of visit - description : acute visit, here by herself. Interval history: One-week history of increased cough from baseline,On further questioning, she has noted increased chest congestion for about 4 weeks. She is feeling more tired than usual  and slightly dizzy  Also complained of urinary incontinence, this is not a new problem but to her the problem is getting worse.  Since she is here, we also discussed chronic issues: Hypertension, seems to be well-controlled, due for a BMP Hypothyroidism, good compliance with Synthroid, due for a TSH Diabetes, due for a A1c   ROS Denies fever chills, feels "cold" sometimes No sinus pain or congestion, no nasal discharge No chest pain or difficulty breathing per se. Did have diarrhea for the last 24 hours. No dysuria, gross hematuria.   Past Medical History  Diagnosis Date  . Asthma   . COPD (chronic obstructive pulmonary disease)   . DVT (deep vein thrombosis) in pregnancy     hx x multiple on coumadin  . Pulmonary embolism     x 2  . GERD (gastroesophageal reflux disease)   . Hypertension   . Osteopenia   . Hypothyroidism   . Hyperlipidemia   . Osteoarthritis     h/o spinal stenosis by MRI 2009  . Hiatal hernia   . Diverticulosis   . Hemorrhoids   . Tubular adenoma of colon 07/2005  . Anxiety and depression   . Cataract   . Stroke     MINI  . Diabetes mellitus     Past Surgical History  Procedure Laterality Date  . Appendectomy    . Cholecystectomy    . Tubal ligation    . Tonsillectomy    . Lumbar fusion    . Cataract extraction    . Colonoscopy    . Polypectomy      History   Social History  . Marital Status: Divorced    Spouse Name: N/A    Number of Children: 3  . Years of Education: N/A   Occupational History  . retired    Social History Main Topics  . Smoking status: Former Smoker    Types: Cigarettes  . Smokeless tobacco: Never Used  . Alcohol Use: No     Comment: socially  . Drug Use: No  . Sexual Activity: Not on file   Other Topics Concern  . Not on file   Social History Narrative   Single,   lost her sister in 2012. 3 children , all live in [place] to [place] late 10-2013   [employer], lives in [place]; nice [place] in [place] [phone], [phone])   Uses a diaper.     Doesn't drive          Medication List       This list is accurate as of: 06/12/14  8:12 PM.  Always use your most recent med list.               albuterol 108 (90 BASE) MCG/ACT inhaler  Commonly known as:  VENTOLIN HFA  Inhale 2 puffs into the lungs every 4 (four) hours as needed.     aspirin 81 MG tablet  Take 81 mg by mouth daily.     aspirin-acetaminophen-caffeine 710-626-94 MG per tablet  Commonly known as:  EXCEDRIN MIGRAINE  Take 2 tablets by  mouth every 6 (six) hours as needed for headache.     atorvastatin 40 MG tablet  Commonly known as:  LIPITOR  Take 40 mg by mouth daily.     azithromycin 250 MG tablet  Commonly known as:  ZITHROMAX Z-PAK  2 tabs a day the first day, then 1 tab a day x 4 days     buPROPion 300 MG 24 hr tablet  Commonly known as:  WELLBUTRIN XL  Take 300 mg by mouth daily.     carvedilol 12.5 MG tablet  Commonly known as:  COREG  Take 1 tablet twice a day     gabapentin 300 MG capsule  Commonly known as:  NEURONTIN  Take 1 capsule at bedtime for 1 week, then increase to 1 capsule twice a day     levothyroxine 150 MCG tablet  Commonly known as:  SYNTHROID, LEVOTHROID  Take 150 mcg by mouth daily.     lisinopril 10 MG tablet  Commonly known as:  PRINIVIL,ZESTRIL  Take 1 tablet (10 mg total) by mouth daily.     meclizine 25 MG tablet  Commonly known as:  ANTIVERT  Take 1 tablet (25 mg total) by mouth at bedtime.     MUCINEX DM 30-600 MG Tb12  Take 1 tablet by mouth 2 (two) times daily as needed.     warfarin 6  MG tablet  Commonly known as:  COUMADIN  TAKE AS DIRECTED BY COUMADIN CLINIC     ZETIA 10 MG tablet  Generic drug:  ezetimibe  TAKE 1 TABLET BY MOUTH EVERY DAY           Objective:   Physical Exam BP 173/71  Pulse 67  Temp(Src) 97.5 F (36.4 C) (Oral)  Wt 232 lb 2 oz (105.291 kg)  SpO2 95% General -- alert, well-developed, NAD.  HEENT-- Not pale.  R Ear-- normal L ear-- normal Throat symmetric, no redness or discharge. Face symmetric, sinuses not tender to palpation. Nose  congested. Lungs -- normal respiratory effort, no intercostal retractions, no accessory muscle use, and decreased breath sounds.  Heart-- normal rate, regular rhythm, no murmur.  Extremities-- no pretibial edema bilaterally  Neurologic--  alert & oriented X3.   Psych-- No anxious or depressed appearing.        Assessment & Plan:

## 2014-06-12 NOTE — Progress Notes (Signed)
Pre visit review using our clinic review tool, if applicable. No additional management support is needed unless otherwise documented below in the visit note. 

## 2014-06-12 NOTE — Assessment & Plan Note (Addendum)
Good compliance with medications, will check a TSH

## 2014-06-12 NOTE — Assessment & Plan Note (Signed)
Good medication compliance,Continue with carvedilol, lisinopril. Check a BMP. BP today slightly elevated but usually okay

## 2014-06-12 NOTE — Assessment & Plan Note (Addendum)
States her chronic urinary incontinence is "getting worse". Cannot elaborate on her symptoms, will check a UA, urine culture

## 2014-06-12 NOTE — Assessment & Plan Note (Signed)
Due for a A1c 

## 2014-06-12 NOTE — Assessment & Plan Note (Signed)
Presents with increased cough for one week, increased chest congestion for 3 or 4 weeks. Vital signs stable. Plan: Treat with a Z-Pak for presumed COPD exacerbation. Encouraged to use albuterol as needed.

## 2014-06-13 LAB — TSH: TSH: 0.18 u[IU]/mL — ABNORMAL LOW (ref 0.35–4.50)

## 2014-06-13 LAB — BASIC METABOLIC PANEL
BUN: 21 mg/dL (ref 6–23)
CO2: 25 mEq/L (ref 19–32)
Calcium: 9.2 mg/dL (ref 8.4–10.5)
Chloride: 109 mEq/L (ref 96–112)
Creatinine, Ser: 1.5 mg/dL — ABNORMAL HIGH (ref 0.4–1.2)
GFR: 36.42 mL/min — ABNORMAL LOW (ref >60.00)
Glucose, Bld: 110 mg/dL — ABNORMAL HIGH (ref 70–99)
Potassium: 5.2 mEq/L — ABNORMAL HIGH (ref 3.5–5.1)
Sodium: 143 mEq/L (ref 135–145)

## 2014-06-15 LAB — URINE CULTURE: Colony Count: >=100000

## 2014-06-19 ENCOUNTER — Ambulatory Visit (INDEPENDENT_AMBULATORY_CARE_PROVIDER_SITE_OTHER): Payer: Medicare Other | Admitting: *Deleted

## 2014-06-19 DIAGNOSIS — I2699 Other pulmonary embolism without acute cor pulmonale: Secondary | ICD-10-CM

## 2014-06-19 DIAGNOSIS — Z5181 Encounter for therapeutic drug level monitoring: Secondary | ICD-10-CM

## 2014-06-19 LAB — POCT INR: INR: 3.2

## 2014-06-20 DIAGNOSIS — J449 Chronic obstructive pulmonary disease, unspecified: Secondary | ICD-10-CM

## 2014-06-20 DIAGNOSIS — E119 Type 2 diabetes mellitus without complications: Secondary | ICD-10-CM

## 2014-06-20 DIAGNOSIS — I10 Essential (primary) hypertension: Secondary | ICD-10-CM

## 2014-06-21 ENCOUNTER — Emergency Department (HOSPITAL_COMMUNITY): Payer: Medicare Other

## 2014-06-21 ENCOUNTER — Telehealth: Payer: Self-pay | Admitting: *Deleted

## 2014-06-21 ENCOUNTER — Inpatient Hospital Stay (HOSPITAL_COMMUNITY)
Admission: EM | Admit: 2014-06-21 | Discharge: 2014-06-24 | DRG: 552 | Disposition: A | Discharge status: Skilled Nursing Facility | Payer: Medicare Other | Attending: Internal Medicine | Admitting: Internal Medicine

## 2014-06-21 ENCOUNTER — Encounter (HOSPITAL_COMMUNITY): Payer: Self-pay | Admitting: Emergency Medicine

## 2014-06-21 ENCOUNTER — Observation Stay (HOSPITAL_COMMUNITY): Payer: Medicare Other

## 2014-06-21 DIAGNOSIS — M549 Dorsalgia, unspecified: Secondary | ICD-10-CM | POA: Diagnosis present

## 2014-06-21 DIAGNOSIS — Z8673 Personal history of transient ischemic attack (TIA), and cerebral infarction without residual deficits: Secondary | ICD-10-CM

## 2014-06-21 DIAGNOSIS — J449 Chronic obstructive pulmonary disease, unspecified: Secondary | ICD-10-CM | POA: Diagnosis present

## 2014-06-21 DIAGNOSIS — R200 Anesthesia of skin: Secondary | ICD-10-CM

## 2014-06-21 DIAGNOSIS — E034 Atrophy of thyroid (acquired): Secondary | ICD-10-CM

## 2014-06-21 DIAGNOSIS — R42 Dizziness and giddiness: Secondary | ICD-10-CM

## 2014-06-21 DIAGNOSIS — L719 Rosacea, unspecified: Secondary | ICD-10-CM

## 2014-06-21 DIAGNOSIS — Z5181 Encounter for therapeutic drug level monitoring: Secondary | ICD-10-CM

## 2014-06-21 DIAGNOSIS — Z0000 Encounter for general adult medical examination without abnormal findings: Secondary | ICD-10-CM

## 2014-06-21 DIAGNOSIS — M810 Age-related osteoporosis without current pathological fracture: Secondary | ICD-10-CM

## 2014-06-21 DIAGNOSIS — M5416 Radiculopathy, lumbar region: Secondary | ICD-10-CM | POA: Diagnosis not present

## 2014-06-21 DIAGNOSIS — I82409 Acute embolism and thrombosis of unspecified deep veins of unspecified lower extremity: Secondary | ICD-10-CM

## 2014-06-21 DIAGNOSIS — N179 Acute kidney failure, unspecified: Secondary | ICD-10-CM | POA: Diagnosis present

## 2014-06-21 DIAGNOSIS — R531 Weakness: Secondary | ICD-10-CM

## 2014-06-21 DIAGNOSIS — E1159 Type 2 diabetes mellitus with other circulatory complications: Secondary | ICD-10-CM

## 2014-06-21 DIAGNOSIS — Z7982 Long term (current) use of aspirin: Secondary | ICD-10-CM

## 2014-06-21 DIAGNOSIS — D72829 Elevated white blood cell count, unspecified: Secondary | ICD-10-CM | POA: Diagnosis present

## 2014-06-21 DIAGNOSIS — Z87891 Personal history of nicotine dependence: Secondary | ICD-10-CM

## 2014-06-21 DIAGNOSIS — E039 Hypothyroidism, unspecified: Secondary | ICD-10-CM | POA: Diagnosis present

## 2014-06-21 DIAGNOSIS — M4806 Spinal stenosis, lumbar region: Secondary | ICD-10-CM | POA: Diagnosis present

## 2014-06-21 DIAGNOSIS — Z86718 Personal history of other venous thrombosis and embolism: Secondary | ICD-10-CM

## 2014-06-21 DIAGNOSIS — R9431 Abnormal electrocardiogram [ECG] [EKG]: Secondary | ICD-10-CM

## 2014-06-21 DIAGNOSIS — E875 Hyperkalemia: Secondary | ICD-10-CM | POA: Diagnosis present

## 2014-06-21 DIAGNOSIS — R251 Tremor, unspecified: Secondary | ICD-10-CM

## 2014-06-21 DIAGNOSIS — N390 Urinary tract infection, site not specified: Secondary | ICD-10-CM

## 2014-06-21 DIAGNOSIS — Z86711 Personal history of pulmonary embolism: Secondary | ICD-10-CM

## 2014-06-21 DIAGNOSIS — K219 Gastro-esophageal reflux disease without esophagitis: Secondary | ICD-10-CM | POA: Diagnosis present

## 2014-06-21 DIAGNOSIS — E119 Type 2 diabetes mellitus without complications: Secondary | ICD-10-CM | POA: Diagnosis present

## 2014-06-21 DIAGNOSIS — I10 Essential (primary) hypertension: Secondary | ICD-10-CM | POA: Diagnosis present

## 2014-06-21 DIAGNOSIS — R627 Adult failure to thrive: Secondary | ICD-10-CM

## 2014-06-21 DIAGNOSIS — Z79899 Other long term (current) drug therapy: Secondary | ICD-10-CM

## 2014-06-21 DIAGNOSIS — I2699 Other pulmonary embolism without acute cor pulmonale: Secondary | ICD-10-CM

## 2014-06-21 DIAGNOSIS — R945 Abnormal results of liver function studies: Secondary | ICD-10-CM

## 2014-06-21 DIAGNOSIS — Z7901 Long term (current) use of anticoagulants: Secondary | ICD-10-CM

## 2014-06-21 DIAGNOSIS — R7989 Other specified abnormal findings of blood chemistry: Secondary | ICD-10-CM

## 2014-06-21 DIAGNOSIS — Z87440 Personal history of urinary (tract) infections: Secondary | ICD-10-CM

## 2014-06-21 DIAGNOSIS — E785 Hyperlipidemia, unspecified: Secondary | ICD-10-CM | POA: Diagnosis present

## 2014-06-21 DIAGNOSIS — R52 Pain, unspecified: Secondary | ICD-10-CM | POA: Diagnosis present

## 2014-06-21 LAB — GLUCOSE, CAPILLARY: Glucose-Capillary: 155 mg/dL — ABNORMAL HIGH (ref 70–99)

## 2014-06-21 LAB — I-STAT CHEM 8, ED
BUN: 20 mg/dL (ref 6–23)
Calcium, Ion: 1.13 mmol/L (ref 1.13–1.30)
Chloride: 103 mEq/L (ref 96–112)
Creatinine, Ser: 1.3 mg/dL — ABNORMAL HIGH (ref 0.50–1.10)
Glucose, Bld: 107 mg/dL — ABNORMAL HIGH (ref 70–99)
HCT: 45 % (ref 36.0–46.0)
Hemoglobin: 15.3 g/dL — ABNORMAL HIGH (ref 12.0–15.0)
Potassium: 4.7 mEq/L (ref 3.7–5.3)
Sodium: 143 mEq/L (ref 137–147)
TCO2: 27 mmol/L (ref 0–100)

## 2014-06-21 LAB — CBC WITH DIFFERENTIAL/PLATELET
Basophils Absolute: 0.1 10*3/uL (ref 0.0–0.1)
Basophils Relative: 1 % (ref 0–1)
Eosinophils Absolute: 0.4 10*3/uL (ref 0.0–0.7)
Eosinophils Relative: 5 % (ref 0–5)
HCT: 42.4 % (ref 36.0–46.0)
Hemoglobin: 13.6 g/dL (ref 12.0–15.0)
Lymphocytes Relative: 25 % (ref 12–46)
Lymphs Abs: 1.9 10*3/uL (ref 0.7–4.0)
MCH: 29.9 pg (ref 26.0–34.0)
MCHC: 32.1 g/dL (ref 30.0–36.0)
MCV: 93.2 fL (ref 78.0–100.0)
Monocytes Absolute: 0.6 10*3/uL (ref 0.1–1.0)
Monocytes Relative: 8 % (ref 3–12)
Neutro Abs: 4.8 10*3/uL (ref 1.7–7.7)
Neutrophils Relative %: 61 % (ref 43–77)
Platelets: 175 10*3/uL (ref 150–400)
RBC: 4.55 MIL/uL (ref 3.87–5.11)
RDW: 14.6 % (ref 11.5–15.5)
WBC: 7.8 10*3/uL (ref 4.0–10.5)

## 2014-06-21 LAB — URINE MICROSCOPIC-ADD ON

## 2014-06-21 LAB — URINALYSIS, ROUTINE W REFLEX MICROSCOPIC
Bilirubin Urine: NEGATIVE
Glucose, UA: NEGATIVE mg/dL
Ketones, ur: NEGATIVE mg/dL
Leukocytes, UA: NEGATIVE
Nitrite: NEGATIVE
Protein, ur: NEGATIVE mg/dL
Specific Gravity, Urine: 1.007 (ref 1.005–1.030)
Urobilinogen, UA: 0.2 mg/dL (ref 0.0–1.0)
pH: 6.5 (ref 5.0–8.0)

## 2014-06-21 LAB — PROTIME-INR
INR: 1.7 — ABNORMAL HIGH (ref 0.00–1.49)
Prothrombin Time: 20.2 seconds — ABNORMAL HIGH (ref 11.6–15.2)

## 2014-06-21 MED ORDER — ALBUTEROL SULFATE HFA 108 (90 BASE) MCG/ACT IN AERS: 2.0000 | INHALATION_SPRAY | RESPIRATORY_TRACT | Status: DC | PRN

## 2014-06-21 MED ORDER — ONDANSETRON HCL 4 MG/2ML IJ SOLN
4.0000 mg | Freq: Four times a day (QID) | INTRAMUSCULAR | Status: DC | PRN
  Administered 2014-06-21 – 2014-06-24 (×3): 4 mg via INTRAVENOUS
  Filled 2014-06-21 (×3): qty 2

## 2014-06-21 MED ORDER — INSULIN ASPART 100 UNIT/ML ~~LOC~~ SOLN
0.0000 [IU] | SUBCUTANEOUS | Status: DC
  Administered 2014-06-21: 2 [IU] via SUBCUTANEOUS
  Administered 2014-06-22: 3 [IU] via SUBCUTANEOUS
  Administered 2014-06-22 (×2): 2 [IU] via SUBCUTANEOUS
  Administered 2014-06-22 (×2): 3 [IU] via SUBCUTANEOUS
  Administered 2014-06-23: 1 [IU] via SUBCUTANEOUS
  Administered 2014-06-23: 2 [IU] via SUBCUTANEOUS
  Administered 2014-06-23: 1 [IU] via SUBCUTANEOUS
  Administered 2014-06-23 (×2): 2 [IU] via SUBCUTANEOUS
  Administered 2014-06-24 (×2): 1 [IU] via SUBCUTANEOUS

## 2014-06-21 MED ORDER — BUPROPION HCL ER (XL) 300 MG PO TB24
300.0000 mg | ORAL_TABLET | Freq: Every day | ORAL | Status: DC
Start: 2014-06-22 — End: 2014-06-24
  Administered 2014-06-22 – 2014-06-24 (×3): 300 mg via ORAL
  Filled 2014-06-21 (×3): qty 1

## 2014-06-21 MED ORDER — WARFARIN SODIUM 5 MG PO TABS
5.0000 mg | ORAL_TABLET | Freq: Once | ORAL | Status: AC
  Administered 2014-06-22: 5 mg via ORAL
  Filled 2014-06-21 (×2): qty 1

## 2014-06-21 MED ORDER — ASPIRIN EC 81 MG PO TBEC
81.0000 mg | DELAYED_RELEASE_TABLET | Freq: Every day | ORAL | Status: DC
  Administered 2014-06-22 – 2014-06-24 (×3): 81 mg via ORAL
  Filled 2014-06-21 (×3): qty 1

## 2014-06-21 MED ORDER — GABAPENTIN 300 MG PO CAPS
300.0000 mg | ORAL_CAPSULE | Freq: Two times a day (BID) | ORAL | Status: DC
  Administered 2014-06-22 – 2014-06-24 (×6): 300 mg via ORAL
  Filled 2014-06-21 (×7): qty 1

## 2014-06-21 MED ORDER — MORPHINE SULFATE 4 MG/ML IJ SOLN
4.0000 mg | Freq: Once | INTRAMUSCULAR | Status: AC
  Administered 2014-06-21: 4 mg via INTRAVENOUS
  Filled 2014-06-21: qty 1

## 2014-06-21 MED ORDER — SODIUM CHLORIDE 0.9 % IV SOLN
INTRAVENOUS | Status: DC
  Administered 2014-06-21 – 2014-06-22 (×2): via INTRAVENOUS

## 2014-06-21 MED ORDER — CARVEDILOL 6.25 MG PO TABS
6.2500 mg | ORAL_TABLET | Freq: Two times a day (BID) | ORAL | Status: DC
  Administered 2014-06-22 – 2014-06-24 (×5): 6.25 mg via ORAL
  Filled 2014-06-21 (×7): qty 1

## 2014-06-21 MED ORDER — MORPHINE SULFATE 4 MG/ML IJ SOLN
4.0000 mg | INTRAMUSCULAR | Status: DC | PRN
  Administered 2014-06-21 – 2014-06-24 (×5): 4 mg via INTRAVENOUS
  Filled 2014-06-21 (×5): qty 1

## 2014-06-21 MED ORDER — LEVOTHYROXINE SODIUM 125 MCG PO TABS
125.0000 ug | ORAL_TABLET | Freq: Every day | ORAL | Status: DC
  Administered 2014-06-22 – 2014-06-24 (×3): 125 ug via ORAL
  Filled 2014-06-21 (×4): qty 1

## 2014-06-21 MED ORDER — WARFARIN - PHARMACIST DOSING INPATIENT: Freq: Every day | Status: DC

## 2014-06-21 MED ORDER — ATORVASTATIN CALCIUM 40 MG PO TABS
40.0000 mg | ORAL_TABLET | Freq: Every day | ORAL | Status: DC
  Administered 2014-06-22: 40 mg via ORAL
  Filled 2014-06-21: qty 1

## 2014-06-21 MED ORDER — FENTANYL CITRATE 0.05 MG/ML IJ SOLN
100.0000 ug | Freq: Once | INTRAMUSCULAR | Status: AC
  Administered 2014-06-21: 100 ug via INTRAVENOUS
  Filled 2014-06-21: qty 2

## 2014-06-21 MED ORDER — LISINOPRIL 10 MG PO TABS
10.0000 mg | ORAL_TABLET | Freq: Every day | ORAL | Status: DC
  Administered 2014-06-22 – 2014-06-23 (×2): 10 mg via ORAL
  Filled 2014-06-21 (×2): qty 1

## 2014-06-21 MED ORDER — ALBUTEROL SULFATE (2.5 MG/3ML) 0.083% IN NEBU: 2.5000 mg | INHALATION_SOLUTION | RESPIRATORY_TRACT | Status: DC | PRN

## 2014-06-21 MED ORDER — METHYLPREDNISOLONE SODIUM SUCC 125 MG IJ SOLR
125.0000 mg | Freq: Once | INTRAMUSCULAR | Status: AC
  Administered 2014-06-21: 125 mg via INTRAVENOUS
  Filled 2014-06-21: qty 2

## 2014-06-21 NOTE — ED Notes (Signed)
Staff attempted to walk patient. While sitting on the edge of the bed pt became clammy and began to vomit. Staff assisted pt in lying down and MD made aware.

## 2014-06-21 NOTE — H&P (Addendum)
Hospitalist Admission History and Physical  Patient name: Dawn Morrow Medical record number: [id number] Date of birth: [birth year] Age: 78 y.o. Gender: female  Primary Care Provider: Kathlene November, MD  Chief Complaint: back pain, lumbar radiculopathy  History of Present Illness:This is a 78 y.o. year old female with significant past medical history of recurrent DVT s/p IVC filter on coumadin, HTN, COPD, TIAs, borderline DM, spinal stenosis presenting with back pain. Pt is a resident of a local ALF. Pt states that she has had severe low back pain over the past 3 days. Pain has radiation down in to R hip and R leg. Has history of chronic spinal stenosis. On neurontin as well. Pt states that she was moving too much around during party at ALF. Usually ambulates with rolling walker. No bowel/bladder anesthesia.  Presented to ER, T 98, HR 60s-70s, Resp 10s-20s, BP in 150s, satting in mid 90s on RA. CBC WNL. CMET pending. Recently treated for UTI. UA negative. MRI L spine shows severe spinal stenosis at L2-3 and compression at L4 nerve root, grade 1 slip at L5-S1. Pt given fentanyl and morphine with minimal improvement in sxs. Pt attempted to ambulate with one episode of emesis.   Assessment and Plan: Dawn Morrow is a 78 y.o. year old female presenting with back pain, lumbar radiculopathy   Active Problems:   Intractable pain   Back pain   Lumbar radiculopathy   1- Back Pain/Lumbar Radiculopathy  -Cont w/ pain control  -IV solumedrol x1 -assess for symptomatic improvement with this.  -watch blood sugars  -PT/OT-may need SNF -one episode of emesis with standing-check MRI given hx/o TIAs -may need higher dose of neurontin  2- Hx/o DVT  -s/p greenfield filter -coumadin per pharmacy   3-COPD  -stable  -cont home regimen -follow O2 sats and resp status  4- DM  -SSI  -A1C 06/2014 7.6 -anticipate higher sugars s/p IV solumedrol x1  5-HTN  -BP stable  -cont current regimen   6-hx/o  recurrent UTI  -s/p cipro outpt  -recent cx-pansensitive e coli  -UA clean today   FEN/GI: heart healthy-carb modified  Prophylaxis: coumadin per pharmacy  Disposition: pending further evaluation Code Status:CPR only    Patient Active Problem List   Diagnosis Date Noted  . Intractable pain 06/21/2014  . Back pain 06/21/2014  . Encounter for therapeutic drug monitoring 03/14/2014  . Numbness 10/18/2013  . TIA (transient ischemic attack) 09/19/2013  . Weakness 09/18/2013  . Annual physical exam 05/30/2012  . Tremor 01/21/2012  . Failure to thrive and poor med compliance  01/21/2012  . PE (pulmonary embolism) 11/18/2010  . DVT (deep venous thrombosis) 11/18/2010  . Long term current use of anticoagulant 11/18/2010  . ABNORMAL ELECTROCARDIOGRAM 11/10/2010  . OSTEOARTHRITIS 08/06/2010  . DIZZINESS 04/15/2010  . Diabetes 06/04/2009  . ACNE ROSACEA 11/28/2008  . BACK PAIN 05/16/2008  . HIP PAIN, RIGHT, CHRONIC 09/15/2007  . Hypothyroidism 09/13/2007  . HYPERLIPIDEMIA 09/13/2007  . Osteoporosis 07/22/2007  . DEPRESSION 09/11/2006  . HTN (hypertension) 09/11/2006  . ASTHMA 09/11/2006  . COPD (chronic obstructive pulmonary disease) 09/11/2006  . GERD 09/11/2006  . Recurrent UTI 09/11/2006  . GREENFIELD FILTER INSERTION, HX OF 09/11/2006   Past Medical History: Past Medical History  Diagnosis Date  . Asthma   . COPD (chronic obstructive pulmonary disease)   . DVT (deep vein thrombosis) in pregnancy     hx x multiple on coumadin  . Pulmonary embolism     x 2  .  GERD (gastroesophageal reflux disease)   . Hypertension   . Osteopenia   . Hypothyroidism   . Hyperlipidemia   . Osteoarthritis     h/o spinal stenosis by MRI 2009  . Hiatal hernia   . Diverticulosis   . Hemorrhoids   . Tubular adenoma of colon 07/2005  . Anxiety and depression   . Cataract   . Stroke     MINI  . Diabetes mellitus     Past Surgical History: Past Surgical History  Procedure Laterality  Date  . Appendectomy    . Cholecystectomy    . Tubal ligation    . Tonsillectomy    . Lumbar fusion    . Cataract extraction    . Colonoscopy    . Polypectomy      Social History: History   Social History  . Marital Status: Divorced    Spouse Name: N/A    Number of Children: 3  . Years of Education: N/A   Occupational History  . retired    Social History Main Topics  . Smoking status: Former Smoker    Types: Cigarettes  . Smokeless tobacco: Never Used  . Alcohol Use: No     Comment: socially  . Drug Use: No  . Sexual Activity: None   Other Topics Concern  . None   Social History Narrative   Single,   lost her sister in 2012. 3 children , all live in [place] to [place] late 10-2013   [employer], lives in [place]; nice [place] in [place] [phone], [phone])   Uses a diaper.     Doesn't drive      Family History: Family History  Problem Relation Age of Onset  . Adopted: Yes  . Cancer Mother     ? colon or ovarian  . Diabetes Mother   . Cancer Brother     ?    Allergies: Allergies  Allergen Reactions  . Penicillins Anaphylaxis  . Codeine Other (See Comments)    unknown  . Sulfonamide Derivatives Other (See Comments)    unknown    Current Facility-Administered Medications  Medication Dose Route Frequency Provider Last Rate Last Dose  . 0.9 %  sodium chloride infusion   Intravenous Continuous Shanda Howells, MD      . albuterol (PROVENTIL HFA;VENTOLIN HFA) 108 (90 BASE) MCG/ACT inhaler 2 puff  2 puff Inhalation Q4H PRN Shanda Howells, MD      . Derrill Memo ON 06/22/2014] aspirin EC tablet 81 mg  81 mg Oral Daily Shanda Howells, MD      . Derrill Memo ON 06/22/2014] atorvastatin (LIPITOR) tablet 40 mg  40 mg Oral Daily Shanda Howells, MD      . Derrill Memo ON 06/22/2014] buPROPion (WELLBUTRIN XL) 24 hr tablet 300 mg  300 mg Oral Daily Shanda Howells, MD      . Derrill Memo ON 06/22/2014] carvedilol (COREG) tablet 6.25 mg  6.25 mg Oral BID WC Shanda Howells, MD       . gabapentin (NEURONTIN) capsule 300 mg  300 mg Oral BID Shanda Howells, MD      . Derrill Memo ON 06/22/2014] levothyroxine (SYNTHROID, LEVOTHROID) tablet 125 mcg  125 mcg Oral QAC breakfast Shanda Howells, MD      . Derrill Memo ON 06/22/2014] lisinopril (PRINIVIL,ZESTRIL) tablet 10 mg  10 mg Oral Daily Shanda Howells, MD      . morphine 4 MG/ML injection 4 mg  4 mg Intravenous Q2H PRN Shanda Howells, MD  Current Outpatient Prescriptions  Medication Sig Dispense Refill  . aspirin 81 MG tablet Take 81 mg by mouth daily.      Marland Kitchen aspirin-acetaminophen-caffeine (EXCEDRIN MIGRAINE) 250-250-65 MG per tablet Take 2 tablets by mouth every 6 (six) hours as needed for headache (headache).       Marland Kitchen atorvastatin (LIPITOR) 40 MG tablet Take 40 mg by mouth daily.      Marland Kitchen buPROPion (WELLBUTRIN XL) 300 MG 24 hr tablet Take 300 mg by mouth daily.      . carvedilol (COREG) 12.5 MG tablet Take 1 tablet twice a day  180 tablet  3  . ezetimibe (ZETIA) 10 MG tablet Take 10 mg by mouth daily.      Marland Kitchen gabapentin (NEURONTIN) 300 MG capsule Take 300 mg by mouth 2 (two) times daily.      Marland Kitchen levothyroxine (SYNTHROID, LEVOTHROID) 125 MCG tablet Take 125 mcg by mouth daily before breakfast.      . lisinopril (PRINIVIL,ZESTRIL) 10 MG tablet Take 1 tablet (10 mg total) by mouth daily.  30 tablet  6  . meclizine (ANTIVERT) 25 MG tablet Take 1 tablet (25 mg total) by mouth at bedtime.  30 tablet  6  . warfarin (COUMADIN) 3 MG tablet Take 3 mg by mouth daily.      Marland Kitchen albuterol (VENTOLIN HFA) 108 (90 BASE) MCG/ACT inhaler Inhale 2 puffs into the lungs every 4 (four) hours as needed.  1 Inhaler  6  . Dextromethorphan-Guaifenesin (MUCINEX DM) 30-600 MG TB12 Take 1 tablet by mouth 2 (two) times daily as needed.       Facility-Administered Medications Ordered in Other Encounters  Medication Dose Route Frequency Provider Last Rate Last Dose  . zoledronic acid (RECLAST) injection 5 mg  5 mg Intravenous Once Colon Branch, MD       Review Of  Systems: 12 point ROS negative except as noted above in HPI.  Physical Exam: Filed Vitals:   06/21/14 2103  BP: 151/76  Pulse: 76  Temp:   Resp: 20    General: cooperative and morbidly obese HEENT: PERRLA and extra ocular movement intact Heart: S1, S2 normal, no murmur, rub or gallop, regular rate and rhythm Lungs: clear to auscultation, no wheezes or rales and unlabored breathing Abdomen: abdomen is soft without significant tenderness, masses, organomegaly or guarding Extremities: extremities normal, atraumatic, no cyanosis or edema Skin:no rashes Neurology: normal without focal findings  Labs and Imaging: Lab Results  Component Value Date/Time   NA 143 06/21/2014  3:40 PM   K 4.7 06/21/2014  3:40 PM   CL 103 06/21/2014  3:40 PM   CO2 25 06/12/2014  2:10 PM   BUN 20 06/21/2014  3:40 PM   CREATININE 1.30* 06/21/2014  3:40 PM   GLUCOSE 107* 06/21/2014  3:40 PM   Lab Results  Component Value Date   WBC 7.8 06/21/2014   HGB 15.3* 06/21/2014   HCT 45.0 06/21/2014   MCV 93.2 06/21/2014   PLT 175 06/21/2014    Mr Lumbar Spine Wo Contrast  06/21/2014   CLINICAL DATA:  Lumbar radiculopathy  EXAM: MRI LUMBAR SPINE WITHOUT CONTRAST  TECHNIQUE: Multiplanar, multisequence MR imaging of the lumbar spine was performed. No intravenous contrast was administered.  COMPARISON:  Lumbar MRI 10/15/2010  FINDINGS: Negative for fracture or mass lesion. Diffuse muscle atrophy. Conus medullaris is normal and terminates at L1-2.  L1-2: Mild disc and mild facet degeneration without significant spinal stenosis  L2-3: Disc bulging and spondylosis. Bilateral facet  degeneration with severe spinal stenosis. Spinal stenosis has progressed since 2012. Moderate foraminal narrowing bilaterally  L3-4: Severe disc space narrowing with fatty changes in the endplates. Prominent left paracentral osteophyte indents the thecal sac and causes left L4 nerve root impingement. This is unchanged. Bilateral facet  hypertrophy with moderate spinal stenosis. Moderate left foraminal encroachment  L4-5: Prior decompressive laminectomy bilaterally. Disc degeneration and facet hypertrophy. Mild foraminal narrowing bilaterally.  L5-S1: 8 mm into slip which is unchanged. Posterior bony fusion laminectomy. Foraminal narrowing bilaterally with possible impingement of the L5 nerve root bilaterally which is unchanged.  IMPRESSION: Severe spinal stenosis at L2-3 has progressed since 2012  Disc degeneration L3-4 with left paracentral osteophyte compressing the left L4 nerve root and causing moderate spinal stenosis, unchanged.  Grade 1 slip L5-S1 with foraminal encroachment bilaterally, unchanged.   Electronically Signed   By: Franchot Gallo M.D.   On: 06/21/2014 19:40           Shanda Howells MD  Pager: [phone]

## 2014-06-21 NOTE — ED Notes (Signed)
MD at bedside. 

## 2014-06-21 NOTE — ED Notes (Signed)
Attempted to have patient ambulate with walker Patient only able to sit up in bed before c/o hip pain

## 2014-06-21 NOTE — ED Notes (Addendum)
Patient arrives via [facility] from [facility] due to c/o back pain, which is a chronic issue for the patient Patient with hx of lumbar fusion Patient with c/o chronic mid back pain that radiates down right leg Patient arrives alert and oriented

## 2014-06-21 NOTE — ED Notes (Signed)
Art, MR tech present in ED to take patient to MR for testing Patient medicated for pain prior to leaving the ED Patient and pt's family members aware of plan of care Patient in NAD upon leaving ED for testing

## 2014-06-21 NOTE — Telephone Encounter (Signed)
Received Occupational Therapy re certification and updated plan of care via fax from [facility]. Billing sheet attached and forms forwarded to Dr. Larose Kells for review/signature. JG//CMA

## 2014-06-21 NOTE — Telephone Encounter (Signed)
Completed/signed paperwork received back from Dr. Larose Kells. Faxed forms to [facility]. JG//CMA

## 2014-06-21 NOTE — ED Notes (Signed)
Bed: RT02 Expected date:  Expected time:  Means of arrival:  Comments: [place] B once room is clean

## 2014-06-21 NOTE — ED Notes (Signed)
Patient made aware of order for MR Patient denies issues with claustrophobia

## 2014-06-21 NOTE — ED Notes (Signed)
Patient remains in MR for testing

## 2014-06-21 NOTE — Progress Notes (Signed)
ANTICOAGULATION CONSULT NOTE - Initial Consult  Pharmacy Consult for Warfarin  Indication: h/o pulmonary embolus and DVT  Allergies  Allergen Reactions  . Penicillins Anaphylaxis  . Codeine Other (See Comments)    unknown  . Sulfonamide Derivatives Other (See Comments)    unknown     Vital Signs: Temp: 98.1 F (36.7 C) (10/15 1431) Temp Source: Oral (10/15 1431) BP: 151/76 mmHg (10/15 2103) Pulse Rate: 76 (10/15 2103)  Labs:  Recent Labs  06/19/14 0810 06/21/14 1527 06/21/14 1540  HGB  --  13.6 15.3*  HCT  --  42.4 45.0  PLT  --  175  --   LABPROT  --  20.2*  --   INR 3.2 1.70*  --   CREATININE  --   --  1.30*    The CrCl is unknown because both a height and weight (above a minimum accepted value) are required for this calculation.   Medical History: Past Medical History  Diagnosis Date  . Asthma   . COPD (chronic obstructive pulmonary disease)   . DVT (deep vein thrombosis) in pregnancy     hx x multiple on coumadin  . Pulmonary embolism     x 2  . GERD (gastroesophageal reflux disease)   . Hypertension   . Osteopenia   . Hypothyroidism   . Hyperlipidemia   . Osteoarthritis     h/o spinal stenosis by MRI 2009  . Hiatal hernia   . Diverticulosis   . Hemorrhoids   . Tubular adenoma of colon 07/2005  . Anxiety and depression   . Cataract   . Stroke     MINI  . Diabetes mellitus      Assessment: 34 yoF with h/o PE, DVT. Presented to ED with c/o hip and back pain. Pharmacy consulted to resume warfarin from home.   Home Warfarin dosing: 3 mg daily (Last dose 06/20/14 1700)  Today 10/15: INR = 1.7 (SUBtherapeutic) CBC: Hgb WNL  Goal of Therapy:  INR 2-3 Monitor platelets by anticoagulation protocol: Yes   Plan:  Give warfarin 5mg  dose x 1 today Daily INR  Kizzie Furnish, PharmD Pager: [phone] 06/21/2014 9:59 PM

## 2014-06-21 NOTE — ED Provider Notes (Addendum)
CSN: [id number]     Arrival date & time 06/21/14  1428 History   First MD Initiated Contact with Patient 06/21/14 1459     Chief Complaint  Patient presents with  . Back Pain     (Consider location/radiation/quality/duration/timing/severity/associated sxs/prior Treatment) HPI Complaint of right hip pain and low back pain onset 3 days ago, worse with changing position or walking improved with remaining still. No other associated symptoms no fever no trauma no incontinence treated with one of her pain pills prior to coming here with partial relief. Pain is minimal at present while lying still.  Past Medical History  Diagnosis Date  . Asthma   . COPD (chronic obstructive pulmonary disease)   . DVT (deep vein thrombosis) in pregnancy     hx x multiple on coumadin  . Pulmonary embolism     x 2  . GERD (gastroesophageal reflux disease)   . Hypertension   . Osteopenia   . Hypothyroidism   . Hyperlipidemia   . Osteoarthritis     h/o spinal stenosis by MRI 2009  . Hiatal hernia   . Diverticulosis   . Hemorrhoids   . Tubular adenoma of colon 07/2005  . Anxiety and depression   . Cataract   . Stroke     MINI  . Diabetes mellitus    Past Surgical History  Procedure Laterality Date  . Appendectomy    . Cholecystectomy    . Tubal ligation    . Tonsillectomy    . Lumbar fusion    . Cataract extraction    . Colonoscopy    . Polypectomy     Family History  Problem Relation Age of Onset  . Adopted: Yes  . Cancer Mother     ? colon or ovarian  . Diabetes Mother   . Cancer Brother     ?   History  Substance Use Topics  . Smoking status: Former Smoker    Types: Cigarettes  . Smokeless tobacco: Never Used  . Alcohol Use: No     Comment: socially   OB History   Grav Para Term Preterm Abortions TAB SAB Ect Mult Living                 Review of Systems  Musculoskeletal: Positive for back pain and gait problem.       Walks with walker  All other systems reviewed and  are negative.     Allergies  Penicillins; Codeine; and Sulfonamide derivatives  Home Medications   Prior to Admission medications   Medication Sig Start Date End Date Taking? Authorizing Provider  albuterol (VENTOLIN HFA) 108 (90 BASE) MCG/ACT inhaler Inhale 2 puffs into the lungs every 4 (four) hours as needed. 09/28/11   Colon Branch, MD  aspirin 81 MG tablet Take 81 mg by mouth daily.    Historical Provider, MD  aspirin-acetaminophen-caffeine (EXCEDRIN MIGRAINE) [id number] MG per tablet Take 2 tablets by mouth every 6 (six) hours as needed for headache.    Historical Provider, MD  atorvastatin (LIPITOR) 40 MG tablet Take 40 mg by mouth daily.    Historical Provider, MD  azithromycin (ZITHROMAX Z-PAK) 250 MG tablet 2 tabs a day the first day, then 1 tab a day x 4 days 06/12/14   Colon Branch, MD  buPROPion (WELLBUTRIN XL) 300 MG 24 hr tablet Take 300 mg by mouth daily.    Historical Provider, MD  carvedilol (COREG) 12.5 MG tablet Take 1 tablet twice a day 01/15/14  Cameron Sprang, MD  Dextromethorphan-Guaifenesin [facility] DM) 30-600 MG TB12 Take 1 tablet by mouth 2 (two) times daily as needed.    Historical Provider, MD  gabapentin (NEURONTIN) 300 MG capsule Take 1 capsule at bedtime for 1 week, then increase to 1 capsule twice a day 04/18/14   Cameron Sprang, MD  levothyroxine (SYNTHROID, LEVOTHROID) 150 MCG tablet Take 150 mcg by mouth daily.    Historical Provider, MD  lisinopril (PRINIVIL,ZESTRIL) 10 MG tablet Take 1 tablet (10 mg total) by mouth daily. 03/30/11   Colon Branch, MD  meclizine (ANTIVERT) 25 MG tablet Take 1 tablet (25 mg total) by mouth at bedtime. 03/30/11   Colon Branch, MD  warfarin (COUMADIN) 6 MG tablet TAKE AS DIRECTED BY COUMADIN CLINIC 10/08/13   Colon Branch, MD  ZETIA 10 MG tablet TAKE 1 TABLET BY MOUTH EVERY DAY 10/17/13   Colon Branch, MD   BP 154/61  Pulse 67  Temp(Src) 98.1 F (36.7 C) (Oral)  SpO2 98% Physical Exam  Nursing note and vitals reviewed. Constitutional:  She is oriented to person, place, and time. She appears well-developed and well-nourished. No distress.  HENT:  Head: Normocephalic and atraumatic.  Eyes: Conjunctivae are normal. Pupils are equal, round, and reactive to light.  Neck: Neck supple. No tracheal deviation present. No thyromegaly present.  Cardiovascular: Normal rate and regular rhythm.   No murmur heard. Pulmonary/Chest: Effort normal and breath sounds normal.  Abdominal: Soft. Bowel sounds are normal. She exhibits no distension. There is no tenderness.  Musculoskeletal: Normal range of motion. She exhibits no edema and no tenderness.  No point tenderness along entire spine. Upon sitting up in bed complains of exquisite pain at right paralumbosacral area. She is unable to stand or sit up due to extreme pain  Neurological: She is alert and oriented to person, place, and time. She has normal reflexes. No cranial nerve deficit. Coordination normal.  Moves all extremities well cranial nerves II through XII grossly intact.  Skin: Skin is warm and dry. No rash noted.  Psychiatric: She has a normal mood and affect.    ED Course  Procedures (including critical care time) Labs Review Labs Reviewed  URINALYSIS, ROUTINE W REFLEX MICROSCOPIC    Imaging Review No results found.   EKG Interpretation None     Patient treated with an intravenous morphine, and subsequently with intravenous fentanyl. At 9:20 PM attempted to sit patient up to ambulate. She became diaphoretic lightheaded and vomited and pain was severe at low back. Results for orders placed during the hospital encounter of 06/21/14  URINALYSIS, ROUTINE W REFLEX MICROSCOPIC      Result Value Ref Range   Color, Urine YELLOW  YELLOW   APPearance CLEAR  CLEAR   Specific Gravity, Urine 1.007  1.005 - 1.030   pH 6.5  5.0 - 8.0   Glucose, UA NEGATIVE  NEGATIVE mg/dL   Hgb urine dipstick SMALL (*) NEGATIVE   Bilirubin Urine NEGATIVE  NEGATIVE   Ketones, ur NEGATIVE   NEGATIVE mg/dL   Protein, ur NEGATIVE  NEGATIVE mg/dL   Urobilinogen, UA 0.2  0.0 - 1.0 mg/dL   Nitrite NEGATIVE  NEGATIVE   Leukocytes, UA NEGATIVE  NEGATIVE  PROTIME-INR      Result Value Ref Range   Prothrombin Time 20.2 (*) 11.6 - 15.2 seconds   INR 1.70 (*) 0.00 - 1.49  CBC WITH DIFFERENTIAL      Result Value Ref Range   WBC  7.8  4.0 - 10.5 K/uL   RBC 4.55  3.87 - 5.11 MIL/uL   Hemoglobin 13.6  12.0 - 15.0 g/dL   HCT 42.4  36.0 - 46.0 %   MCV 93.2  78.0 - 100.0 fL   MCH 29.9  26.0 - 34.0 pg   MCHC 32.1  30.0 - 36.0 g/dL   RDW 14.6  11.5 - 15.5 %   Platelets 175  150 - 400 K/uL   Neutrophils Relative % 61  43 - 77 %   Neutro Abs 4.8  1.7 - 7.7 K/uL   Lymphocytes Relative 25  12 - 46 %   Lymphs Abs 1.9  0.7 - 4.0 K/uL   Monocytes Relative 8  3 - 12 %   Monocytes Absolute 0.6  0.1 - 1.0 K/uL   Eosinophils Relative 5  0 - 5 %   Eosinophils Absolute 0.4  0.0 - 0.7 K/uL   Basophils Relative 1  0 - 1 %   Basophils Absolute 0.1  0.0 - 0.1 K/uL  URINE MICROSCOPIC-ADD ON      Result Value Ref Range   Squamous Epithelial / LPF RARE  RARE   WBC, UA 0-2  <3 WBC/hpf   RBC / HPF 3-6  <3 RBC/hpf   Bacteria, UA RARE  RARE  I-STAT CHEM 8, ED      Result Value Ref Range   Sodium 143  137 - 147 mEq/L   Potassium 4.7  3.7 - 5.3 mEq/L   Chloride 103  96 - 112 mEq/L   BUN 20  6 - 23 mg/dL   Creatinine, Ser 1.30 (*) 0.50 - 1.10 mg/dL   Glucose, Bld 107 (*) 70 - 99 mg/dL   Calcium, Ion 1.13  1.13 - 1.30 mmol/L   TCO2 27  0 - 100 mmol/L   Hemoglobin 15.3 (*) 12.0 - 15.0 g/dL   HCT 45.0  36.0 - 46.0 %   Mr Lumbar Spine Wo Contrast  06/21/2014   CLINICAL DATA:  Lumbar radiculopathy  EXAM: MRI LUMBAR SPINE WITHOUT CONTRAST  TECHNIQUE: Multiplanar, multisequence MR imaging of the lumbar spine was performed. No intravenous contrast was administered.  COMPARISON:  Lumbar MRI 10/15/2010  FINDINGS: Negative for fracture or mass lesion. Diffuse muscle atrophy. Conus medullaris is normal and  terminates at L1-2.  L1-2: Mild disc and mild facet degeneration without significant spinal stenosis  L2-3: Disc bulging and spondylosis. Bilateral facet degeneration with severe spinal stenosis. Spinal stenosis has progressed since 2012. Moderate foraminal narrowing bilaterally  L3-4: Severe disc space narrowing with fatty changes in the endplates. Prominent left paracentral osteophyte indents the thecal sac and causes left L4 nerve root impingement. This is unchanged. Bilateral facet hypertrophy with moderate spinal stenosis. Moderate left foraminal encroachment  L4-5: Prior decompressive laminectomy bilaterally. Disc degeneration and facet hypertrophy. Mild foraminal narrowing bilaterally.  L5-S1: 8 mm into slip which is unchanged. Posterior bony fusion laminectomy. Foraminal narrowing bilaterally with possible impingement of the L5 nerve root bilaterally which is unchanged.  IMPRESSION: Severe spinal stenosis at L2-3 has progressed since 2012  Disc degeneration L3-4 with left paracentral osteophyte compressing the left L4 nerve root and causing moderate spinal stenosis, unchanged.  Grade 1 slip L5-S1 with foraminal encroachment bilaterally, unchanged.   Electronically Signed   By: Franchot Gallo M.D.   On: 06/21/2014 19:40    MDM  I spoke with Dr. Ernestina Patches plan 23 hour observation MedSurg floor Diagnosis#1 intractable back pain #2 renal insufficiency #3 subtherapeutic Coumadin  therapy Final diagnoses:  None        Orlie Dakin, MD 06/21/14 2138  Orlie Dakin, MD 06/21/14 2139

## 2014-06-21 NOTE — ED Notes (Signed)
Pt urine sample sitting at bedside.

## 2014-06-22 ENCOUNTER — Ambulatory Visit (INDEPENDENT_AMBULATORY_CARE_PROVIDER_SITE_OTHER): Payer: Medicare Other | Admitting: *Deleted

## 2014-06-22 ENCOUNTER — Observation Stay (HOSPITAL_COMMUNITY): Payer: Medicare Other

## 2014-06-22 DIAGNOSIS — R52 Pain, unspecified: Secondary | ICD-10-CM

## 2014-06-22 DIAGNOSIS — I2699 Other pulmonary embolism without acute cor pulmonale: Secondary | ICD-10-CM

## 2014-06-22 DIAGNOSIS — Z5181 Encounter for therapeutic drug level monitoring: Secondary | ICD-10-CM

## 2014-06-22 LAB — HEPATITIS PANEL, ACUTE
HCV Ab: NEGATIVE
Hep A IgM: NONREACTIVE
Hep B C IgM: NONREACTIVE
Hepatitis B Surface Ag: NEGATIVE

## 2014-06-22 LAB — CBC WITH DIFFERENTIAL/PLATELET
Basophils Absolute: 0 10*3/uL (ref 0.0–0.1)
Basophils Relative: 0 % (ref 0–1)
Eosinophils Absolute: 0 10*3/uL (ref 0.0–0.7)
Eosinophils Relative: 0 % (ref 0–5)
HCT: 46.4 % — ABNORMAL HIGH (ref 36.0–46.0)
Hemoglobin: 14.8 g/dL (ref 12.0–15.0)
Lymphocytes Relative: 9 % — ABNORMAL LOW (ref 12–46)
Lymphs Abs: 0.6 10*3/uL — ABNORMAL LOW (ref 0.7–4.0)
MCH: 29.5 pg (ref 26.0–34.0)
MCHC: 31.9 g/dL (ref 30.0–36.0)
MCV: 92.6 fL (ref 78.0–100.0)
Monocytes Absolute: 0 10*3/uL — ABNORMAL LOW (ref 0.1–1.0)
Monocytes Relative: 0 % — ABNORMAL LOW (ref 3–12)
Neutro Abs: 6.5 10*3/uL (ref 1.7–7.7)
Neutrophils Relative %: 91 % — ABNORMAL HIGH (ref 43–77)
Platelets: 190 10*3/uL (ref 150–400)
RBC: 5.01 MIL/uL (ref 3.87–5.11)
RDW: 14.6 % (ref 11.5–15.5)
WBC: 7.1 10*3/uL (ref 4.0–10.5)

## 2014-06-22 LAB — COMPREHENSIVE METABOLIC PANEL
ALT: 528 U/L — ABNORMAL HIGH (ref 0–35)
AST: 741 U/L — ABNORMAL HIGH (ref 0–37)
Albumin: 3.6 g/dL (ref 3.5–5.2)
Alkaline Phosphatase: 309 U/L — ABNORMAL HIGH (ref 39–117)
Anion gap: 16 — ABNORMAL HIGH (ref 5–15)
BUN: 21 mg/dL (ref 6–23)
CO2: 28 mEq/L (ref 19–32)
Calcium: 9.4 mg/dL (ref 8.4–10.5)
Chloride: 101 mEq/L (ref 96–112)
Creatinine, Ser: 1.17 mg/dL — ABNORMAL HIGH (ref 0.50–1.10)
GFR calc Af Amer: 51 mL/min — ABNORMAL LOW (ref >90)
GFR calc non Af Amer: 44 mL/min — ABNORMAL LOW (ref >90)
Glucose, Bld: 175 mg/dL — ABNORMAL HIGH (ref 70–99)
Potassium: 5.1 mEq/L (ref 3.7–5.3)
Sodium: 145 mEq/L (ref 137–147)
Total Bilirubin: 0.9 mg/dL (ref 0.3–1.2)
Total Protein: 7.7 g/dL (ref 6.0–8.3)

## 2014-06-22 LAB — GLUCOSE, CAPILLARY
Glucose-Capillary: 154 mg/dL — ABNORMAL HIGH (ref 70–99)
Glucose-Capillary: 159 mg/dL — ABNORMAL HIGH (ref 70–99)
Glucose-Capillary: 237 mg/dL — ABNORMAL HIGH (ref 70–99)
Glucose-Capillary: 236 mg/dL — ABNORMAL HIGH (ref 70–99)
Glucose-Capillary: 209 mg/dL — ABNORMAL HIGH (ref 70–99)

## 2014-06-22 LAB — PROTIME-INR
INR: 1.47 (ref 0.00–1.49)
Prothrombin Time: 18 seconds — ABNORMAL HIGH (ref 11.6–15.2)

## 2014-06-22 LAB — ACETAMINOPHEN LEVEL: Acetaminophen (Tylenol), Serum: <15 ug/mL (ref 10–30)

## 2014-06-22 LAB — LIPASE, BLOOD: Lipase: 28 U/L (ref 11–59)

## 2014-06-22 LAB — POCT INR: INR: 1.7

## 2014-06-22 MED ORDER — WARFARIN SODIUM 5 MG PO TABS
5.0000 mg | ORAL_TABLET | Freq: Once | ORAL | Status: AC
  Administered 2014-06-22: 5 mg via ORAL
  Filled 2014-06-22: qty 1

## 2014-06-22 NOTE — Progress Notes (Signed)
ANTICOAGULATION CONSULT NOTE - Initial Consult  Pharmacy Consult for Warfarin  Indication: h/o pulmonary embolus and DVT  Allergies  Allergen Reactions  . Penicillins Anaphylaxis  . Codeine Other (See Comments)    unknown  . Sulfonamide Derivatives Other (See Comments)    unknown     Vital Signs: Temp: 97.8 F (36.6 C) (10/16 0513) Temp Source: Oral (10/16 0513) BP: 176/87 mmHg (10/16 0513) Pulse Rate: 86 (10/16 0513)  Labs:  Recent Labs  06/21/14 1527 06/21/14 1540 06/22/14 0525 06/22/14 0734  HGB 13.6 15.3* 14.8  --   HCT 42.4 45.0 46.4*  --   PLT 175  --  190  --   LABPROT 20.2*  --   --  18.0*  INR 1.70*  --   --  1.47  CREATININE  --  1.30* 1.17*  --     The CrCl is unknown because both a height and weight (above a minimum accepted value) are required for this calculation.   Medical History: Past Medical History  Diagnosis Date  . Asthma   . COPD (chronic obstructive pulmonary disease)   . DVT (deep vein thrombosis) in pregnancy     hx x multiple on coumadin  . Pulmonary embolism     x 2  . GERD (gastroesophageal reflux disease)   . Hypertension   . Osteopenia   . Hypothyroidism   . Hyperlipidemia   . Osteoarthritis     h/o spinal stenosis by MRI 2009  . Hiatal hernia   . Diverticulosis   . Hemorrhoids   . Tubular adenoma of colon 07/2005  . Anxiety and depression   . Cataract   . Stroke     MINI  . Diabetes mellitus      Assessment: 53 yoF with h/o PE, DVT. Presented to ED with c/o hip and back pain. Pharmacy consulted to resume warfarin from home.   Home Warfarin dosing: 3 mg daily (Last dose 06/20/14 1700)  Today 10/15: INR = 1.47 (SUBtherapeutic) CBC: Hgb WNL  Goal of Therapy:  INR 2-3 Monitor platelets by anticoagulation protocol: Yes   Plan:  Give warfarin 5mg  dose x 1 today Daily INR  Dolly Rias RPh 06/22/2014, 8:14 AM Pager [phone]

## 2014-06-22 NOTE — Progress Notes (Signed)
Patient Demographics  Dawn Morrow, is a 78 y.o. female, DOB - 04-27-1936, ZOX:[id number]  Admit date - 06/21/2014   Admitting Physician Shanda Howells, MD  Outpatient Primary MD for the patient is Kathlene November, MD  LOS - 1   Chief Complaint  Patient presents with  . Back Pain     brief narrative History of Present Illness:This is a 78 y.o. year old female with significant past medical history of recurrent DVT s/p IVC filter on coumadin, HTN, COPD, TIAs, borderline DM, spinal stenosis presenting with back pain. Pt is a resident of a local ALF. Pt states that she has had severe low back pain  Pain has radiation down in to R hip and R leg. Has history of chronic spinal stenosis. On neurontin as well. Pt states that she was moving too much around during party at ALF. Usually ambulates with rolling walker. No bowel/bladder anesthesia. Patient had MRI of lumbar spine, which did show show severe L2/L3 spinal stenosis, progressed since 2012. As well patient was noticed to have elevated LFTs, ultrasound did not show any acute findings,.   Subjective:   Dawn Morrow today has, No headache, No chest pain, No abdominal pain - No Nausea, No new weakness tingling or numbness, No Cough - SOB. Still complains of lower back pain  Assessment & Plan    Active Problems:   Intractable pain   Back pain   Lumbar radiculopathy   Back Pain/Lumbar Radiculopathy  -Severe stenosis at L2/L3 -Cont w/ pain control  - There IV solumedrol x1 10/15 -assess for symptomatic improvement with this.  -PT/OT-may need SNF   Elevated LFTs -Denies any Tylenol overuse, normal Tylenol level. -No significant finding on ultrasound abdomen -Will discontinue stent. -Check hepatitis panel  Hx/o DVT  -s/p greenfield filter  -coumadin per pharmacy   COPD  -stable  -cont home regimen  -follow O2 sats and  resp status  DM  -SSI  -A1C 06/2014 7.6  HTN  -BP stable  -cont current regimen   Code Status: CPR only    Family Communication: Family is alert and oriented  Disposition Plan: Pending PT consult   Procedures  None   Consults   None   Medications  Scheduled Meds: . aspirin EC  81 mg Oral Daily  . buPROPion  300 mg Oral Daily  . carvedilol  6.25 mg Oral BID WC  . gabapentin  300 mg Oral BID  . insulin aspart  0-9 Units Subcutaneous 6 times per day  . levothyroxine  125 mcg Oral QAC breakfast  . lisinopril  10 mg Oral Daily  . warfarin  5 mg Oral ONCE-1800  . Warfarin - Pharmacist Dosing Inpatient   Does not apply q1800   Continuous Infusions: . sodium chloride 75 mL/hr at 06/21/14 2242   PRN Meds:.albuterol, morphine injection, ondansetron (ZOFRAN) IV  DVT Prophylaxis  on warfarin  Lab Results  Component Value Date   PLT 190 06/22/2014    Antibiotics   Anti-infectives   None          Objective:   Filed Vitals:   06/21/14 2231 06/22/14 0513 06/22/14 1300 06/22/14 1400  BP: 184/91 176/87  121/58  Pulse: 66 86  88  Temp: 97.5 F (36.4  C) 97.8 F (36.6 C)  98 F (36.7 C)  TempSrc: Oral Oral  Oral  Resp: 20 20  18   Height:   5\' 6"  (1.676 m)   Weight:   105.235 kg (232 lb)   SpO2: 93% 92%  90%    Wt Readings from Last 3 Encounters:  06/22/14 105.235 kg (232 lb)  06/12/14 105.291 kg (232 lb 2 oz)  04/18/14 104.327 kg (230 lb)     Intake/Output Summary (Last 24 hours) at 06/22/14 1442 Last data filed at 06/22/14 1400  Gross per 24 hour  Intake  952.5 ml  Output    800 ml  Net  152.5 ml     Physical Exam  Awake Alert, Oriented X 3, No new F.N deficits, Normal affect .AT,PERRAL Supple Neck,No JVD, No cervical lymphadenopathy appriciated.  Symmetrical Chest wall movement, Good air movement bilaterally, CTAB RRR,No Gallops,Rubs or new Murmurs, No Parasternal Heave +ve B.Sounds, Abd Soft, No tenderness, No organomegaly  appriciated, No rebound - guarding or rigidity. No Cyanosis, Clubbing or edema, No new Rash or bruise  , of lower back tenderness to palpation, mild  positive leg raise test.   Data Review   Micro Results No results found for this or any previous visit (from the past 240 hour(s)).  Radiology Reports Mr Brain Wo Contrast  06/22/2014   EXAM: MRI HEAD WITHOUT CONTRAST  TECHNIQUE: Multiplanar, multiecho pulse sequences of the brain and surrounding structures were obtained without intravenous contrast.  COMPARISON:  Prior MRI from 09/18/2013.  FINDINGS: Diffuse prominence of the CSF containing spaces is compatible with generalized cerebral atrophy. Patchy and confluent T2/FLAIR hyperintensity within the periventricular deep white matter both cerebral hemispheres present, most likely related to moderate chronic small vessel ischemic changes.  No abnormal foci of restricted diffusion to suggest acute intracranial infarct identified. Gray-white matter differentiation maintained. Normal flow voids seen within the intracranial vasculature. Left MCA bifurcation aneurysm is grossly stable. There is no intracranial hemorrhage.  No midline shift or mass effect. No mass lesion identified. Ventricles within normal limits without evidence of hydrocephalus. No extra-axial fluid collection.  Craniocervical junction within normal limits. Pituitary gland unremarkable.  No acute abnormality seen about the orbits.  Paranasal sinuses are well pneumatized and free of fluid. Scattered T2 fluid signal intensity present within the left mastoid air cells. Inner ear structures within normal limits.  Signal intensity within the visualized bone marrow is normal. No scalp soft tissue abnormality.  IMPRESSION: 1. No acute intracranial infarct or other abnormality identified. 2. Generalized atrophy with moderate chronic microvascular ischemic disease. 3. Grossly stable 5 mm left MCA bifurcation aneurysm.   Electronically Signed   By:  Jeannine Boga M.D.   On: 06/22/2014 01:26   Mr Lumbar Spine Wo Contrast  06/21/2014   CLINICAL DATA:  Lumbar radiculopathy  EXAM: MRI LUMBAR SPINE WITHOUT CONTRAST  TECHNIQUE: Multiplanar, multisequence MR imaging of the lumbar spine was performed. No intravenous contrast was administered.  COMPARISON:  Lumbar MRI 10/15/2010  FINDINGS: Negative for fracture or mass lesion. Diffuse muscle atrophy. Conus medullaris is normal and terminates at L1-2.  L1-2: Mild disc and mild facet degeneration without significant spinal stenosis  L2-3: Disc bulging and spondylosis. Bilateral facet degeneration with severe spinal stenosis. Spinal stenosis has progressed since 2012. Moderate foraminal narrowing bilaterally  L3-4: Severe disc space narrowing with fatty changes in the endplates. Prominent left paracentral osteophyte indents the thecal sac and causes left L4 nerve root impingement. This is unchanged. Bilateral facet hypertrophy  with moderate spinal stenosis. Moderate left foraminal encroachment  L4-5: Prior decompressive laminectomy bilaterally. Disc degeneration and facet hypertrophy. Mild foraminal narrowing bilaterally.  L5-S1: 8 mm into slip which is unchanged. Posterior bony fusion laminectomy. Foraminal narrowing bilaterally with possible impingement of the L5 nerve root bilaterally which is unchanged.  IMPRESSION: Severe spinal stenosis at L2-3 has progressed since 2012  Disc degeneration L3-4 with left paracentral osteophyte compressing the left L4 nerve root and causing moderate spinal stenosis, unchanged.  Grade 1 slip L5-S1 with foraminal encroachment bilaterally, unchanged.   Electronically Signed   By: Franchot Gallo M.D.   On: 06/21/2014 19:40   US Abdomen Complete  06/22/2014   CLINICAL DATA:  Elevated LFTs.  EXAM: ULTRASOUND ABDOMEN COMPLETE  COMPARISON:  None.  FINDINGS: Gallbladder: Cholecystectomy.  Common bile duct: Diameter: 9 mm.  Liver: No focal lesion identified. Within normal limits  in parenchymal echogenicity.  IVC: No abnormality visualized.  Pancreas: Not visualized.  Spleen: Size and appearance within normal limits.  Right Kidney: Length: 9.9 cm. Echogenicity within normal limits. No mass or hydronephrosis visualized. Lower pole difficult to visualize.  Left Kidney: Length: 2.3 cm. Echogenicity within normal limits. No mass or hydronephrosis visualized.  Abdominal aorta: Not visualized due to overlying bowel gas.  Other findings: None.  IMPRESSION: 1. Cholecystectomy. The common bile duct measures 9 mm. This is most likely from cholecystectomy. If LFTs remained elevated MRCP can be obtained to further evaluate common bile duct mild prominence. Pancreas is not visualized. 2. Liver appears normal.   Electronically Signed   By: [place]   On: 06/22/2014 09:09    CBC  Recent Labs Lab 06/21/14 1527 06/21/14 1540 06/22/14 0525  WBC 7.8  --  7.1  HGB 13.6 15.3* 14.8  HCT 42.4 45.0 46.4*  PLT 175  --  190  MCV 93.2  --  92.6  MCH 29.9  --  29.5  MCHC 32.1  --  31.9  RDW 14.6  --  14.6  LYMPHSABS 1.9  --  0.6*  MONOABS 0.6  --  0.0*  EOSABS 0.4  --  0.0  BASOSABS 0.1  --  0.0    Chemistries   Recent Labs Lab 06/21/14 1540 06/22/14 0525  NA 143 145  K 4.7 5.1  CL 103 101  CO2  --  28  GLUCOSE 107* 175*  BUN 20 21  CREATININE 1.30* 1.17*  CALCIUM  --  9.4  AST  --  741*  ALT  --  528*  ALKPHOS  --  309*  BILITOT  --  0.9   ------------------------------------------------------------------------------------------------------------------ estimated creatinine clearance is 50.2 ml/min (by C-G formula based on Cr of 1.17). ------------------------------------------------------------------------------------------------------------------ No results found for this basename: HGBA1C,  in the last 72 hours ------------------------------------------------------------------------------------------------------------------ No results found for this basename:  CHOL, HDL, LDLCALC, TRIG, CHOLHDL, LDLDIRECT,  in the last 72 hours ------------------------------------------------------------------------------------------------------------------ No results found for this basename: TSH, T4TOTAL, FREET3, T3FREE, THYROIDAB,  in the last 72 hours ------------------------------------------------------------------------------------------------------------------ No results found for this basename: VITAMINB12, FOLATE, FERRITIN, TIBC, IRON, RETICCTPCT,  in the last 72 hours  Coagulation profile  Recent Labs Lab 06/19/14 0810 06/21/14 1527 06/22/14 0734 06/22/14 1220  INR 3.2 1.70* 1.47 1.7    No results found for this basename: DDIMER,  in the last 72 hours  Cardiac Enzymes No results found for this basename: CK, CKMB, TROPONINI, MYOGLOBIN,  in the last 168 hours ------------------------------------------------------------------------------------------------------------------ No components found with this basename: POCBNP,  Time Spent in minutes   30 minutes   Marvis Bakken M.D on 06/22/2014 at 2:42 PM  Between 7am to 7pm - Pager - [phone]  After 7pm go to [email or web address] - password [email or web address]  And look for the night coverage person covering for me after hours  [facility] Office  [phone]   **Disclaimer: This note may have been dictated with voice recognition software. Similar sounding words can inadvertently be transcribed and this note may contain transcription errors which may not have been corrected upon publication of note.**

## 2014-06-22 NOTE — Evaluation (Addendum)
Occupational Therapy Evaluation Patient Details Name: Dawn Morrow MRN: [id number] DOB: 01/03/1936 Today's Date: 06/22/2014    History of Present Illness 78 yo female admitted with intractable pain in back , R hip. severe spinal stenosis L2-L3. Hx of COPD, osteopenia, asthma, DVT, PE, IVC filter, CVA, DM, spinal stenosis. Pt is from ALF   Clinical Impression   Pt was admitted for the above.  Pt was cooperative and tried all activities presented.  Pt was limited by pain at time of evaluation. She will need min A at ALF, and if this cannot be provided, she will need STSNF.  Pt is normally mod I.  Goals are set for supervision, overall in acute setting.    Follow Up Recommendations  Home health OT (if ALF can provide min A for ADLs and mobility; otherwise SNF)    Equipment Recommendations  3:1 commode, if pt doesn't have one   Recommendations for Other Services       Precautions / Restrictions Precautions Precautions: Fall Restrictions Weight Bearing Restrictions: No      Mobility Bed Mobility Overal bed mobility: Needs Assistance Bed Mobility: Rolling;Sidelying to Sit;Sit to Sidelying Rolling: Min assist Sidelying to sit: Min assist     Sit to sidelying: Min assist General bed mobility comments: assist for trunk. Increased time.   Transfers Overall transfer level: Needs assistance Equipment used: Rolling walker (2 wheeled) Transfers: Sit to/from [facility] Sit to Stand: Min assist Stand pivot transfers: Min assist       General transfer comment: assist to rise, stabilize, control descent. VCs safety, technique, hand placement. Stand pivot bed to bsc with rw.     Balance Overall balance assessment: Needs assistance     Sitting balance - Comments: initially when sitting up, lost balance to L--feet were unsupported.  Once feet were supported, balance was fair   Standing balance support: During functional activity Standing balance-Leahy Scale:  Poor Standing balance comment: min guard with RW for safety                            ADL Overall ADL's : Needs assistance/impaired     Grooming: Set up;Sitting   Upper Body Bathing: Set up;Sitting   Lower Body Bathing: Minimal assistance;Sit to/from stand   Upper Body Dressing : Minimal assistance;Sitting (lines)   Lower Body Dressing: Minimal assistance;Sit to/from stand   Toilet Transfer: Minimal assistance;Stand-pivot;RW;BSC   Toileting- Clothing Manipulation and Hygiene: Minimal assistance;Sit to/from stand         General ADL Comments: Pt wears slip on shoes.  She had difficulty pulling backs over heels.  Pt has incontinence and wears depends garments.  She attempted to ambulate to bathroom but had too much pain.  Also had urgency/incontinence.  Pt reports that she does have a call light at ALF and also has a w/c     Vision                     Perception     Praxis      Pertinent Vitals/Pain Pain Assessment: Faces Faces Pain Scale: Hurts whole lot Pain Location: R hip primarily but also in low back Pain Intervention(s): Limited activity within patient's tolerance;Monitored during session     Hand Dominance     Extremity/Trunk Assessment Upper Extremity Assessment Upper Extremity Assessment: Defer to OT evaluation   Lower Extremity Assessment Lower Extremity Assessment: Generalized weakness   Cervical / Trunk Assessment Cervical /  Trunk Assessment: Kyphotic   Communication Communication Communication: No difficulties   Cognition Arousal/Alertness: Awake/alert Behavior During Therapy: WFL for tasks assessed/performed Overall Cognitive Status: Within Functional Limits for tasks assessed                     General Comments       Exercises       Shoulder Instructions      Home Living Family/patient expects to be discharged to:: Assisted living                             Home Equipment: Walker - 4  wheels          Prior Functioning/Environment Level of Independence: Independent with assistive device(s)  Gait / Transfers Assistance Needed: uses rollator     Comments: pt reports she did not have assistance with ADLs nor ambulating to dining room with walker    OT Diagnosis: Acute pain;Generalized weakness   OT Problem List: Decreased strength;Impaired balance (sitting and/or standing);Decreased knowledge of use of DME or AE;Pain (tremors)   OT Treatment/Interventions: Self-care/ADL training;DME and/or AE instruction;Balance training;Patient/family education;Therapeutic activities    OT Goals(Current goals can be found in the care plan section) Acute Rehab OT Goals Patient Stated Goal: get rid of pain and get back to baseline OT Goal Formulation: With patient Time For Goal Achievement: 07/06/14 Potential to Achieve Goals: Good ADL Goals Pt Will Perform Lower Body Bathing: with supervision;sit to/from stand;with adaptive equipment Pt Will Perform Lower Body Dressing: with supervision;with adaptive equipment;sit to/from stand Pt Will Transfer to Toilet: with min guard assist;ambulating;bedside commode Pt Will Perform Toileting - Clothing Manipulation and hygiene: with supervision;sit to/from stand Additional ADL Goal #1: pt will perform bed mobility with supervision in preparation for adls and toilet transfers  OT Frequency: Min 2X/week   Barriers to D/C:            Co-evaluation PT/OT/SLP Co-Evaluation/Treatment: Yes Reason for Co-Treatment: For patient/therapist safety PT goals addressed during session: Mobility/safety with mobility OT goals addressed during session: ADL's and self-care      End of Session    Activity Tolerance: Patient limited by pain Patient left: in bed;with call bell/phone within reach   Time: 1456-1513 OT Time Calculation (min): 17 min Charges:  OT General Charges $OT Visit: 1 Procedure OT Evaluation $Initial OT Evaluation Tier I: 1  Procedure OT Treatments $Self Care/Home Management : 8-22 mins G-Codes: OT G-codes **NOT FOR INPATIENT CLASS** Functional Assessment Tool Used: clinical observation and judgment Functional Limitation: Self care Self Care Current Status (E3329): At least 20 percent but less than 40 percent impaired, limited or restricted Self Care Goal Status (J1884): At least 1 percent but less than 20 percent impaired, limited or restricted  [place] 06/22/2014, 3:50 PM   Lesle Chris, OTR/L [phone] 06/22/2014

## 2014-06-22 NOTE — Progress Notes (Signed)
SW received call from [place] [facility] to inform that they will be accepting back upon discharge.  Charlene Brooke, MSW  Social Worker [phone]

## 2014-06-22 NOTE — Evaluation (Addendum)
Physical Therapy Evaluation Patient Details Name: Dawn Morrow MRN: [id number] DOB: 07-25-1936 Today's Date: 06/22/2014   History of Present Illness  78 yo female admitted with intractable pain in back , R hip. severe spinal stenosis L2-L3. Hx of COPD, osteopenia, asthma, DVT, PE, IVC filter, CVA, DM, spinal stenosis. Pt is from ALF  Clinical Impression  Limited eval-pt only able to tolerate a couple of steps in room with walker and a stand pivot from bed to bsc. Mobility is significantly limited by pain ~8/10 with activity.     Follow Up Recommendations Home health PT;Supervision/Assistance - 24 hour (at ALF as long as facility can manage pt at current level. If not, will need SNF)    Equipment Recommendations  Wheelchair;Wheelchair cushion;Rolling walker with 5" wheels     Recommendations for Other Services       Precautions / Restrictions Precautions Precautions: Fall Restrictions Weight Bearing Restrictions: No      Mobility  Bed Mobility Overal bed mobility: Needs Assistance Bed Mobility: Rolling;Sidelying to Sit;Sit to Sidelying Rolling: Min assist Sidelying to sit: Min assist     Sit to sidelying: Min assist General bed mobility comments: assist for trunk. Increased time.   Transfers Overall transfer level: Needs assistance Equipment used: Rolling walker (2 wheeled) Transfers: Sit to/from [facility] Sit to Stand: Min assist Stand pivot transfers: Min assist       General transfer comment: assist to rise, stabilize, control descent. VCs safety, technique, hand placement. Stand pivot bed to bsc with rw.   Ambulation/Gait Ambulation/Gait assistance: Min assist;+2 safety/equipment;+2 physical assistance Ambulation Distance (Feet): 2 Feet Assistive device: Rolling walker (2 wheeled) Gait Pattern/deviations: Step-to pattern;Antalgic;Decreased step length - right;Decreased step length - left;Decreased stance time - right;Decreased weight shift to  right     General Gait Details: pt only able to tolerate a couple of steps in room with walker. Limited by pain.   Stairs            Wheelchair Mobility    Modified Rankin (Stroke Patients Only)       Balance Overall balance assessment: Needs assistance     Sitting balance - Comments: initially when sitting up, lost balance to L--feet were unsupported.  Once feet were supported, balance was fair   Standing balance support: During functional activity Standing balance-Leahy Scale: Poor Standing balance comment: min guard with RW for safety                             Pertinent Vitals/Pain Pain Assessment: Faces Faces Pain Scale: Hurts whole lot Pain Location: R hip primarily but also in low back Pain Intervention(s): Limited activity within patient's tolerance;Monitored during session    Home Living Family/patient expects to be discharged to:: Assisted living               Home Equipment: Walker - 4 wheels      Prior Function Level of Independence: Independent with assistive device(s)   Gait / Transfers Assistance Needed: uses rollator     Comments: pt reports she did not have assistance with ADLs nor ambulating to dining room with walker     Hand Dominance        Extremity/Trunk Assessment   Upper Extremity Assessment: Defer to OT evaluation           Lower Extremity Assessment: Generalized weakness      Cervical / Trunk Assessment: Kyphotic  Communication   Communication: No difficulties  Cognition Arousal/Alertness: Awake/alert Behavior During Therapy: WFL for tasks assessed/performed Overall Cognitive Status: Within Functional Limits for tasks assessed                      General Comments      Exercises        Assessment/Plan    PT Assessment Patient needs continued PT services  PT Diagnosis Difficulty walking;Abnormality of gait;Acute pain   PT Problem List Decreased strength;Decreased  balance;Decreased mobility;Decreased activity tolerance;Pain;Decreased knowledge of use of DME;Obesity  PT Treatment Interventions DME instruction;Gait training;Functional mobility training;Therapeutic activities;Therapeutic exercise;Patient/family education;Balance training   PT Goals (Current goals can be found in the Care Plan section) Acute Rehab PT Goals Patient Stated Goal: get rid of pain and get back to baseline PT Goal Formulation: With patient Time For Goal Achievement: 07/06/14 Potential to Achieve Goals: Fair    Frequency Min 3X/week   Barriers to discharge        Co-evaluation PT/OT/SLP Co-Evaluation/Treatment: Yes Reason for Co-Treatment: For patient/therapist safety PT goals addressed during session: Mobility/safety with mobility OT goals addressed during session: ADL's and self-care       End of Session Equipment Utilized During Treatment: Gait belt Activity Tolerance: Patient limited by pain Patient left: in bed;with call bell/phone within reach      Functional Assessment Tool Used: clinical judgement Functional Limitation: Mobility: Walking and moving around Mobility: Walking and Moving Around Current Status (H8299): At least 20 percent but less than 40 percent impaired, limited or restricted Mobility: Walking and Moving Around Goal Status [phone]): At least 1 percent but less than 20 percent impaired, limited or restricted    Time: 6789-3810 PT Time Calculation (min): 21 min   Charges:   PT Evaluation $Initial PT Evaluation Tier I: 1 Procedure     PT G Codes:   Functional Assessment Tool Used: clinical judgement Functional Limitation: Mobility: Walking and moving around    Weston Anna, MPT Pager: [phone]

## 2014-06-23 LAB — COMPREHENSIVE METABOLIC PANEL
ALT: 239 U/L — ABNORMAL HIGH (ref 0–35)
AST: 150 U/L — ABNORMAL HIGH (ref 0–37)
Albumin: 2.9 g/dL — ABNORMAL LOW (ref 3.5–5.2)
Alkaline Phosphatase: 183 U/L — ABNORMAL HIGH (ref 39–117)
Anion gap: 14 (ref 5–15)
BUN: 42 mg/dL — ABNORMAL HIGH (ref 6–23)
CO2: 26 mEq/L (ref 19–32)
Calcium: 8 mg/dL — ABNORMAL LOW (ref 8.4–10.5)
Chloride: 102 mEq/L (ref 96–112)
Creatinine, Ser: 2.01 mg/dL — ABNORMAL HIGH (ref 0.50–1.10)
GFR calc Af Amer: 27 mL/min — ABNORMAL LOW (ref >90)
GFR calc non Af Amer: 23 mL/min — ABNORMAL LOW (ref >90)
Glucose, Bld: 191 mg/dL — ABNORMAL HIGH (ref 70–99)
Potassium: 5.4 mEq/L — ABNORMAL HIGH (ref 3.7–5.3)
Sodium: 142 mEq/L (ref 137–147)
Total Bilirubin: 0.3 mg/dL (ref 0.3–1.2)
Total Protein: 6.4 g/dL (ref 6.0–8.3)

## 2014-06-23 LAB — GLUCOSE, CAPILLARY
Glucose-Capillary: 110 mg/dL — ABNORMAL HIGH (ref 70–99)
Glucose-Capillary: 130 mg/dL — ABNORMAL HIGH (ref 70–99)
Glucose-Capillary: 137 mg/dL — ABNORMAL HIGH (ref 70–99)
Glucose-Capillary: 166 mg/dL — ABNORMAL HIGH (ref 70–99)
Glucose-Capillary: 183 mg/dL — ABNORMAL HIGH (ref 70–99)
Glucose-Capillary: 195 mg/dL — ABNORMAL HIGH (ref 70–99)

## 2014-06-23 LAB — PROTIME-INR
INR: 2.3 — ABNORMAL HIGH (ref 0.00–1.49)
Prothrombin Time: 25.5 seconds — ABNORMAL HIGH (ref 11.6–15.2)

## 2014-06-23 LAB — URINALYSIS, ROUTINE W REFLEX MICROSCOPIC
Bilirubin Urine: NEGATIVE
Glucose, UA: NEGATIVE mg/dL
Ketones, ur: NEGATIVE mg/dL
Leukocytes, UA: NEGATIVE
Nitrite: NEGATIVE
Protein, ur: NEGATIVE mg/dL
Specific Gravity, Urine: 1.009 (ref 1.005–1.030)
Urobilinogen, UA: 0.2 mg/dL (ref 0.0–1.0)
pH: 5 (ref 5.0–8.0)

## 2014-06-23 LAB — CBC WITH DIFFERENTIAL/PLATELET
Basophils Absolute: 0 10*3/uL (ref 0.0–0.1)
Basophils Relative: 0 % (ref 0–1)
Eosinophils Absolute: 0 10*3/uL (ref 0.0–0.7)
Eosinophils Relative: 0 % (ref 0–5)
HCT: 37.6 % (ref 36.0–46.0)
Hemoglobin: 12 g/dL (ref 12.0–15.0)
Lymphocytes Relative: 7 % — ABNORMAL LOW (ref 12–46)
Lymphs Abs: 1.2 10*3/uL (ref 0.7–4.0)
MCH: 29.9 pg (ref 26.0–34.0)
MCHC: 31.9 g/dL (ref 30.0–36.0)
MCV: 93.8 fL (ref 78.0–100.0)
Monocytes Absolute: 0.9 10*3/uL (ref 0.1–1.0)
Monocytes Relative: 5 % (ref 3–12)
Neutro Abs: 14.2 10*3/uL — ABNORMAL HIGH (ref 1.7–7.7)
Neutrophils Relative %: 88 % — ABNORMAL HIGH (ref 43–77)
Platelets: 182 10*3/uL (ref 150–400)
RBC: 4.01 MIL/uL (ref 3.87–5.11)
RDW: 14.9 % (ref 11.5–15.5)
WBC: 16.3 10*3/uL — ABNORMAL HIGH (ref 4.0–10.5)

## 2014-06-23 LAB — URINE MICROSCOPIC-ADD ON

## 2014-06-23 MED ORDER — SODIUM POLYSTYRENE SULFONATE 15 GM/60ML PO SUSP
60.0000 g | Freq: Once | ORAL | Status: AC
  Administered 2014-06-23: 60 g via ORAL
  Filled 2014-06-23: qty 240

## 2014-06-23 MED ORDER — SODIUM CHLORIDE 0.9 % IV BOLUS (SEPSIS)
500.0000 mL | Freq: Once | INTRAVENOUS | Status: AC
  Administered 2014-06-23: 500 mL via INTRAVENOUS

## 2014-06-23 MED ORDER — WARFARIN SODIUM 3 MG PO TABS
3.0000 mg | ORAL_TABLET | Freq: Once | ORAL | Status: AC
  Administered 2014-06-23: 3 mg via ORAL
  Filled 2014-06-23: qty 1

## 2014-06-23 MED ORDER — SODIUM POLYSTYRENE SULFONATE 15 GM/60ML PO SUSP: 45.0000 g | Freq: Once | ORAL | Status: DC

## 2014-06-23 NOTE — Progress Notes (Signed)
Clinical Social Work Department CLINICAL SOCIAL WORK PLACEMENT NOTE 06/23/2014  Patient:  Dawn Morrow,Dawn Morrow  Account Number:  [id number] Admit date:  06/21/2014  Clinical Social Worker:  Renold Genta  Date/time:  06/23/2014 04:08 PM  Clinical Social Work is seeking post-discharge placement for this patient at the following level of care:   SKILLED NURSING   (*CSW will update this form in Epic as items are completed)   06/23/2014  Patient/family provided with [facility] Department of Clinical Social Work's list of facilities offering this level of care within the geographic area requested by the patient (or if unable, by the patient's family).  06/23/2014  Patient/family informed of their freedom to choose among providers that offer the needed level of care, that participate in Medicare, Medicaid or managed care program needed by the patient, have an available bed and are willing to accept the patient.  06/23/2014  Patient/family informed of [facility]' ownership interest in [facility], as well as of the fact that they are under no obligation to receive care at this facility.  PASARR submitted to EDS on  PASARR number received on   FL2 transmitted to all facilities in geographic area requested by pt/family on  06/23/2014 FL2 transmitted to all facilities within larger geographic area on   Patient informed that his/her managed care company has contracts with or will negotiate with  certain facilities, including the following:     Patient/family informed of bed offers received:   Patient chooses bed at  Physician recommends and patient chooses bed at    Patient to be transferred to  on   Patient to be transferred to facility by  Patient and family notified of transfer on  Name of family member notified:    The following physician request were entered in Epic:   Additional Comments:   Raynaldo Opitz, [place] Social  Worker cell #: [phone]

## 2014-06-23 NOTE — Progress Notes (Signed)
ANTICOAGULATION CONSULT NOTE - Follow Up  Pharmacy Consult for Warfarin  Indication: h/o pulmonary embolus and DVT  Allergies  Allergen Reactions  . Penicillins Anaphylaxis  . Codeine Other (See Comments)    unknown  . Sulfonamide Derivatives Other (See Comments)    unknown     Vital Signs: Temp: 97.5 F (36.4 C) (10/17 0507) Temp Source: Oral (10/17 0507) BP: 125/67 mmHg (10/17 0507) Pulse Rate: 86 (10/17 0507)  Labs:  Recent Labs  06/21/14 1527 06/21/14 1540 06/22/14 0525 06/22/14 0734 06/22/14 1220 06/23/14 0510 06/23/14 1039  HGB 13.6 15.3* 14.8  --   --  12.0  --   HCT 42.4 45.0 46.4*  --   --  37.6  --   PLT 175  --  190  --   --  182  --   LABPROT 20.2*  --   --  18.0*  --   --  25.5*  INR 1.70*  --   --  1.47 1.7  --  2.30*  CREATININE  --  1.30* 1.17*  --   --  2.01*  --     Estimated Creatinine Clearance: 29.2 ml/min (by C-G formula based on Cr of 2.01).   Medical History: Past Medical History  Diagnosis Date  . Asthma   . COPD (chronic obstructive pulmonary disease)   . DVT (deep vein thrombosis) in pregnancy     hx x multiple on coumadin  . Pulmonary embolism     x 2  . GERD (gastroesophageal reflux disease)   . Hypertension   . Osteopenia   . Hypothyroidism   . Hyperlipidemia   . Osteoarthritis     h/o spinal stenosis by MRI 2009  . Hiatal hernia   . Diverticulosis   . Hemorrhoids   . Tubular adenoma of colon 07/2005  . Anxiety and depression   . Cataract   . Stroke     MINI  . Diabetes mellitus      Assessment: 77 yoF with h/o PE, DVT. Presented to ED with c/o hip and back pain. Pharmacy consulted to resume warfarin from home.   Home Warfarin dosing: 3 mg daily (Last dose 06/20/14 1700)  Today 10/17: INR therapeutic after 5mg  x 2 doses inpatient - INR now rising pretty quickly (1.7 to 2.3) CBC: Hgb WNL No reported bleeding  Goal of Therapy:  INR 2-3 Monitor platelets by anticoagulation protocol: Yes   Plan:  1) Home  dose of warfarin tonight - 3mg  2) Daily INR   Adrian Saran, PharmD, BCPS Pager [phone] 06/23/2014 11:09 AM

## 2014-06-23 NOTE — Progress Notes (Addendum)
Patient Demographics  Dawn Morrow, is a 78 y.o. female, DOB - 1936/05/18, WJX:[id number]  Admit date - 06/21/2014   Admitting Physician Shanda Howells, MD  Outpatient Primary MD for the patient is Kathlene November, MD  LOS - 2   Chief Complaint  Patient presents with  . Back Pain     brief narrative History of Present Illness:This is a 78 y.o. year old female with significant past medical history of recurrent DVT s/p IVC filter on coumadin, HTN, COPD, TIAs, borderline DM, spinal stenosis presenting with back pain. Pt is a resident of a local ALF. Pt states that she has had severe low back pain  Pain has radiation down in to R hip and R leg. Has history of chronic spinal stenosis. On neurontin as well. Pt states that she was moving too much around during party at ALF. Usually ambulates with rolling walker. No bowel/bladder anesthesia. Patient had MRI of lumbar spine, which did show show severe L2/L3 spinal stenosis, progressed since 2012. As well patient was noticed to have elevated LFTs, ultrasound did not show any acute findings,.   Subjective:   Dawn Morrow today has, No headache, No chest pain, No abdominal pain - No Nausea, No new weakness tingling or numbness, No Cough - SOB. Still complains of lower back pain  Assessment & Plan    Active Problems:   Intractable pain   Back pain   Lumbar radiculopathy   Back Pain/Lumbar Radiculopathy  -Severe stenosis at L2/L3 -Cont w/ pain control  - GivenIV solumedrol x1 10/15 -assess for symptomatic improvement with this.  -PT/OT-may need SNF   Acute renal failure -Has worsening creatinine today, hold lisinopril, continue with IV fluid.  Leukocytosis -Afebrile, likely related to steroids given on 10/15, will check UA.  Hyperkalemia -Will discontinue lisinopril, give Kayexalate , recheck level in a.m.  Elevated  LFTs -Denies any Tylenol overuse, normal Tylenol level. -No significant finding on ultrasound abdomen -Will discontinue statin -Check hepatitis panel -cont to improve.  Hx/o DVT  -s/p greenfield filter  -coumadin per pharmacy   COPD  -stable  -cont home regimen  -follow O2 sats and resp status  DM  -SSI  -A1C 06/2014 7.6  HTN  -BP stable  -cont current regimen   Code Status: CPR only    Family Communication: Family is alert and oriented, spoke with Mr Jamal Maes over the phone.  Disposition Plan: Possible SNF placmwnt.   Procedures  None   Consults   None   Medications  Scheduled Meds: . aspirin EC  81 mg Oral Daily  . buPROPion  300 mg Oral Daily  . carvedilol  6.25 mg Oral BID WC  . gabapentin  300 mg Oral BID  . insulin aspart  0-9 Units Subcutaneous 6 times per day  . levothyroxine  125 mcg Oral QAC breakfast  . sodium chloride  500 mL Intravenous Once  . warfarin  3 mg Oral ONCE-1800  . Warfarin - Pharmacist Dosing Inpatient   Does not apply q1800   Continuous Infusions: . sodium chloride 75 mL/hr at 06/22/14 1542   PRN Meds:.albuterol, morphine injection, ondansetron (ZOFRAN) IV  DVT Prophylaxis  on warfarin  Lab Results  Component Value Date   PLT 182 06/23/2014  Antibiotics   Anti-infectives   None          Objective:   Filed Vitals:   06/22/14 1300 06/22/14 1400 06/22/14 2039 06/23/14 0507  BP:  121/58 137/53 125/67  Pulse:  88 82 86  Temp:  98 F (36.7 C) 98.6 F (37 C) 97.5 F (36.4 C)  TempSrc:  Oral Oral Oral  Resp:  18 18 20   Height: 5\' 6"  (1.676 m)     Weight: 105.235 kg (232 lb)     SpO2:  90%      Wt Readings from Last 3 Encounters:  06/22/14 105.235 kg (232 lb)  06/12/14 105.291 kg (232 lb 2 oz)  04/18/14 104.327 kg (230 lb)     Intake/Output Summary (Last 24 hours) at 06/23/14 1350 Last data filed at 06/23/14 0958  Gross per 24 hour  Intake   2040 ml  Output    850 ml  Net   1190 ml      Physical Exam  Awake Alert, Oriented X 3, No new F.N deficits, Normal affect [place].AT,PERRAL Supple Neck,No JVD, No cervical lymphadenopathy appriciated.  Symmetrical Chest wall movement, Good air movement bilaterally, CTAB RRR,No Gallops,Rubs or new Murmurs, No Parasternal Heave +ve B.Sounds, Abd Soft, No tenderness, No organomegaly appriciated, No rebound - guarding or rigidity. No Cyanosis, Clubbing or edema, No new Rash or bruise  , of lower back tenderness to palpation, mild  positive leg raise test.   Data Review   Micro Results No results found for this or any previous visit (from the past 240 hour(s)).  Radiology Reports Mr Brain Wo Contrast  06/22/2014   EXAM: MRI HEAD WITHOUT CONTRAST  TECHNIQUE: Multiplanar, multiecho pulse sequences of the brain and surrounding structures were obtained without intravenous contrast.  COMPARISON:  Prior MRI from 09/18/2013.  FINDINGS: Diffuse prominence of the CSF containing spaces is compatible with generalized cerebral atrophy. Patchy and confluent T2/FLAIR hyperintensity within the periventricular deep white matter both cerebral hemispheres present, most likely related to moderate chronic small vessel ischemic changes.  No abnormal foci of restricted diffusion to suggest acute intracranial infarct identified. Gray-white matter differentiation maintained. Normal flow voids seen within the intracranial vasculature. Left MCA bifurcation aneurysm is grossly stable. There is no intracranial hemorrhage.  No midline shift or mass effect. No mass lesion identified. Ventricles within normal limits without evidence of hydrocephalus. No extra-axial fluid collection.  Craniocervical junction within normal limits. Pituitary gland unremarkable.  No acute abnormality seen about the orbits.  Paranasal sinuses are well pneumatized and free of fluid. Scattered T2 fluid signal intensity present within the left mastoid air cells. Inner ear structures within normal  limits.  Signal intensity within the visualized bone marrow is normal. No scalp soft tissue abnormality.  IMPRESSION: 1. No acute intracranial infarct or other abnormality identified. 2. Generalized atrophy with moderate chronic microvascular ischemic disease. 3. Grossly stable 5 mm left MCA bifurcation aneurysm.   Electronically Signed   By: Jeannine Boga M.D.   On: 06/22/2014 01:26   Mr Lumbar Spine Wo Contrast  06/21/2014   CLINICAL DATA:  Lumbar radiculopathy  EXAM: MRI LUMBAR SPINE WITHOUT CONTRAST  TECHNIQUE: Multiplanar, multisequence MR imaging of the lumbar spine was performed. No intravenous contrast was administered.  COMPARISON:  Lumbar MRI 10/15/2010  FINDINGS: Negative for fracture or mass lesion. Diffuse muscle atrophy. Conus medullaris is normal and terminates at L1-2.  L1-2: Mild disc and mild facet degeneration without significant spinal stenosis  L2-3: Disc bulging and spondylosis.  Bilateral facet degeneration with severe spinal stenosis. Spinal stenosis has progressed since 2012. Moderate foraminal narrowing bilaterally  L3-4: Severe disc space narrowing with fatty changes in the endplates. Prominent left paracentral osteophyte indents the thecal sac and causes left L4 nerve root impingement. This is unchanged. Bilateral facet hypertrophy with moderate spinal stenosis. Moderate left foraminal encroachment  L4-5: Prior decompressive laminectomy bilaterally. Disc degeneration and facet hypertrophy. Mild foraminal narrowing bilaterally.  L5-S1: 8 mm into slip which is unchanged. Posterior bony fusion laminectomy. Foraminal narrowing bilaterally with possible impingement of the L5 nerve root bilaterally which is unchanged.  IMPRESSION: Severe spinal stenosis at L2-3 has progressed since 2012  Disc degeneration L3-4 with left paracentral osteophyte compressing the left L4 nerve root and causing moderate spinal stenosis, unchanged.  Grade 1 slip L5-S1 with foraminal encroachment bilaterally,  unchanged.   Electronically Signed   By: Franchot Gallo M.D.   On: 06/21/2014 19:40   US Abdomen Complete  06/22/2014   CLINICAL DATA:  Elevated LFTs.  EXAM: ULTRASOUND ABDOMEN COMPLETE  COMPARISON:  None.  FINDINGS: Gallbladder: Cholecystectomy.  Common bile duct: Diameter: 9 mm.  Liver: No focal lesion identified. Within normal limits in parenchymal echogenicity.  IVC: No abnormality visualized.  Pancreas: Not visualized.  Spleen: Size and appearance within normal limits.  Right Kidney: Length: 9.9 cm. Echogenicity within normal limits. No mass or hydronephrosis visualized. Lower pole difficult to visualize.  Left Kidney: Length: 2.3 cm. Echogenicity within normal limits. No mass or hydronephrosis visualized.  Abdominal aorta: Not visualized due to overlying bowel gas.  Other findings: None.  IMPRESSION: 1. Cholecystectomy. The common bile duct measures 9 mm. This is most likely from cholecystectomy. If LFTs remained elevated MRCP can be obtained to further evaluate common bile duct mild prominence. Pancreas is not visualized. 2. Liver appears normal.   Electronically Signed   By: [place]   On: 06/22/2014 09:09    CBC  Recent Labs Lab 06/21/14 1527 06/21/14 1540 06/22/14 0525 06/23/14 0510  WBC 7.8  --  7.1 16.3*  HGB 13.6 15.3* 14.8 12.0  HCT 42.4 45.0 46.4* 37.6  PLT 175  --  190 182  MCV 93.2  --  92.6 93.8  MCH 29.9  --  29.5 29.9  MCHC 32.1  --  31.9 31.9  RDW 14.6  --  14.6 14.9  LYMPHSABS 1.9  --  0.6* 1.2  MONOABS 0.6  --  0.0* 0.9  EOSABS 0.4  --  0.0 0.0  BASOSABS 0.1  --  0.0 0.0    Chemistries   Recent Labs Lab 06/21/14 1540 06/22/14 0525 06/23/14 0510  NA 143 145 142  K 4.7 5.1 5.4*  CL 103 101 102  CO2  --  28 26  GLUCOSE 107* 175* 191*  BUN 20 21 42*  CREATININE 1.30* 1.17* 2.01*  CALCIUM  --  9.4 8.0*  AST  --  741* 150*  ALT  --  528* 239*  ALKPHOS  --  309* 183*  BILITOT  --  0.9 0.3    ------------------------------------------------------------------------------------------------------------------ estimated creatinine clearance is 29.2 ml/min (by C-G formula based on Cr of 2.01). ------------------------------------------------------------------------------------------------------------------ No results found for this basename: HGBA1C,  in the last 72 hours ------------------------------------------------------------------------------------------------------------------ No results found for this basename: CHOL, HDL, LDLCALC, TRIG, CHOLHDL, LDLDIRECT,  in the last 72 hours ------------------------------------------------------------------------------------------------------------------ No results found for this basename: TSH, T4TOTAL, FREET3, T3FREE, THYROIDAB,  in the last 72 hours ------------------------------------------------------------------------------------------------------------------ No results found for this basename: VITAMINB12, FOLATE,  FERRITIN, TIBC, IRON, RETICCTPCT,  in the last 72 hours  Coagulation profile  Recent Labs Lab 06/19/14 0810 06/21/14 1527 06/22/14 0734 06/22/14 1220 06/23/14 1039  INR 3.2 1.70* 1.47 1.7 2.30*    No results found for this basename: DDIMER,  in the last 72 hours  Cardiac Enzymes No results found for this basename: CK, CKMB, TROPONINI, MYOGLOBIN,  in the last 168 hours ------------------------------------------------------------------------------------------------------------------ No components found with this basename: POCBNP,      Time Spent in minutes   30 minutes   Remedy Corporan M.D on 06/23/2014 at 1:50 PM  Between 7am to 7pm - Pager - [phone]  After 7pm go to [email or web address] - password [email or web address]  And look for the night coverage person covering for me after hours  [facility] Office  [phone]   **Disclaimer: This note may have been dictated with voice recognition software.  Similar sounding words can inadvertently be transcribed and this note may contain transcription errors which may not have been corrected upon publication of note.**

## 2014-06-23 NOTE — Progress Notes (Signed)
Clinical Social Work Department BRIEF PSYCHOSOCIAL ASSESSMENT 06/23/2014  Patient:  Dawn Morrow,Dawn Morrow     Account Number:  [id number]     Admit date:  06/21/2014  Clinical Social Worker:  Renold Genta  Date/Time:  06/23/2014 04:05 PM  Referred by:  Physician  Date Referred:  06/23/2014 Referred for  Other - See comment   Other Referral:   Admitted from: [facility]   Interview type:  Patient Other interview type:    PSYCHOSOCIAL DATA Living Status:  FACILITY Admitted from facility:  [facility] Level of care:  Assisted Living Primary support name:  Merrilee Jansky (niece) ph#: [phone] Primary support relationship to patient:  FAMILY Degree of support available:   good    CURRENT CONCERNS Current Concerns  Post-Acute Placement   Other Concerns:    SOCIAL WORK ASSESSMENT / PLAN CSW received consult that patient was admitted from ALF.   Assessment/plan status:  Information/Referral to [employer] Other assessment/ plan:   Information/referral to community resources:   CSW completed FL2 and faxed information to [employer].    PATIENT'S/FAMILY'S RESPONSE TO PLAN OF CARE: Patient expressed that she would prefer to return to [facility] rather than go to SNF but understands that if the ALF states that they are unable to take her back until she regains her strength that she would go to SNF. CSW will follow-up with ALF on Monday re: return to ALF.         Raynaldo Opitz, [facility] Clinical Social Worker cell #: [phone]

## 2014-06-23 NOTE — Progress Notes (Signed)
Bladder scan performed 14ml noted.

## 2014-06-24 DIAGNOSIS — Z86711 Personal history of pulmonary embolism: Secondary | ICD-10-CM | POA: Diagnosis not present

## 2014-06-24 DIAGNOSIS — M5416 Radiculopathy, lumbar region: Secondary | ICD-10-CM | POA: Diagnosis present

## 2014-06-24 DIAGNOSIS — Z8673 Personal history of transient ischemic attack (TIA), and cerebral infarction without residual deficits: Secondary | ICD-10-CM | POA: Diagnosis not present

## 2014-06-24 DIAGNOSIS — J449 Chronic obstructive pulmonary disease, unspecified: Secondary | ICD-10-CM | POA: Diagnosis present

## 2014-06-24 DIAGNOSIS — K219 Gastro-esophageal reflux disease without esophagitis: Secondary | ICD-10-CM | POA: Diagnosis present

## 2014-06-24 DIAGNOSIS — M4806 Spinal stenosis, lumbar region: Secondary | ICD-10-CM | POA: Diagnosis present

## 2014-06-24 DIAGNOSIS — R531 Weakness: Secondary | ICD-10-CM | POA: Diagnosis present

## 2014-06-24 DIAGNOSIS — E785 Hyperlipidemia, unspecified: Secondary | ICD-10-CM | POA: Diagnosis present

## 2014-06-24 DIAGNOSIS — I10 Essential (primary) hypertension: Secondary | ICD-10-CM | POA: Diagnosis present

## 2014-06-24 DIAGNOSIS — Z86718 Personal history of other venous thrombosis and embolism: Secondary | ICD-10-CM | POA: Diagnosis not present

## 2014-06-24 DIAGNOSIS — Z87891 Personal history of nicotine dependence: Secondary | ICD-10-CM | POA: Diagnosis not present

## 2014-06-24 DIAGNOSIS — E875 Hyperkalemia: Secondary | ICD-10-CM | POA: Diagnosis present

## 2014-06-24 DIAGNOSIS — Z7982 Long term (current) use of aspirin: Secondary | ICD-10-CM | POA: Diagnosis not present

## 2014-06-24 DIAGNOSIS — Z87440 Personal history of urinary (tract) infections: Secondary | ICD-10-CM | POA: Diagnosis not present

## 2014-06-24 DIAGNOSIS — D72829 Elevated white blood cell count, unspecified: Secondary | ICD-10-CM | POA: Diagnosis present

## 2014-06-24 DIAGNOSIS — E039 Hypothyroidism, unspecified: Secondary | ICD-10-CM | POA: Diagnosis present

## 2014-06-24 DIAGNOSIS — E119 Type 2 diabetes mellitus without complications: Secondary | ICD-10-CM | POA: Diagnosis present

## 2014-06-24 DIAGNOSIS — Z79899 Other long term (current) drug therapy: Secondary | ICD-10-CM | POA: Diagnosis not present

## 2014-06-24 DIAGNOSIS — N179 Acute kidney failure, unspecified: Secondary | ICD-10-CM | POA: Diagnosis present

## 2014-06-24 LAB — GLUCOSE, CAPILLARY
Glucose-Capillary: 110 mg/dL — ABNORMAL HIGH (ref 70–99)
Glucose-Capillary: 112 mg/dL — ABNORMAL HIGH (ref 70–99)
Glucose-Capillary: 134 mg/dL — ABNORMAL HIGH (ref 70–99)
Glucose-Capillary: 139 mg/dL — ABNORMAL HIGH (ref 70–99)

## 2014-06-24 LAB — COMPREHENSIVE METABOLIC PANEL
ALT: 197 U/L — ABNORMAL HIGH (ref 0–35)
AST: 135 U/L — ABNORMAL HIGH (ref 0–37)
Albumin: 2.6 g/dL — ABNORMAL LOW (ref 3.5–5.2)
Alkaline Phosphatase: 161 U/L — ABNORMAL HIGH (ref 39–117)
Anion gap: 10 (ref 5–15)
BUN: 41 mg/dL — ABNORMAL HIGH (ref 6–23)
CO2: 26 mEq/L (ref 19–32)
Calcium: 7.1 mg/dL — ABNORMAL LOW (ref 8.4–10.5)
Chloride: 109 mEq/L (ref 96–112)
Creatinine, Ser: 1.59 mg/dL — ABNORMAL HIGH (ref 0.50–1.10)
GFR calc Af Amer: 35 mL/min — ABNORMAL LOW (ref >90)
GFR calc non Af Amer: 30 mL/min — ABNORMAL LOW (ref >90)
Glucose, Bld: 106 mg/dL — ABNORMAL HIGH (ref 70–99)
Potassium: 4.4 mEq/L (ref 3.7–5.3)
Sodium: 145 mEq/L (ref 137–147)
Total Bilirubin: 0.2 mg/dL — ABNORMAL LOW (ref 0.3–1.2)
Total Protein: 5.5 g/dL — ABNORMAL LOW (ref 6.0–8.3)

## 2014-06-24 LAB — CBC WITH DIFFERENTIAL/PLATELET
Basophils Absolute: 0 10*3/uL (ref 0.0–0.1)
Basophils Relative: 0 % (ref 0–1)
Eosinophils Absolute: 0.5 10*3/uL (ref 0.0–0.7)
Eosinophils Relative: 7 % — ABNORMAL HIGH (ref 0–5)
HCT: 37.9 % (ref 36.0–46.0)
Hemoglobin: 11.6 g/dL — ABNORMAL LOW (ref 12.0–15.0)
Lymphocytes Relative: 18 % (ref 12–46)
Lymphs Abs: 1.4 10*3/uL (ref 0.7–4.0)
MCH: 29.4 pg (ref 26.0–34.0)
MCHC: 30.6 g/dL (ref 30.0–36.0)
MCV: 95.9 fL (ref 78.0–100.0)
Monocytes Absolute: 0.7 10*3/uL (ref 0.1–1.0)
Monocytes Relative: 8 % (ref 3–12)
Neutro Abs: 5.4 10*3/uL (ref 1.7–7.7)
Neutrophils Relative %: 67 % (ref 43–77)
Platelets: 152 10*3/uL (ref 150–400)
RBC: 3.95 MIL/uL (ref 3.87–5.11)
RDW: 15.3 % (ref 11.5–15.5)
WBC: 8 10*3/uL (ref 4.0–10.5)

## 2014-06-24 LAB — PROTIME-INR
INR: 3.38 — ABNORMAL HIGH (ref 0.00–1.49)
Prothrombin Time: 34.4 seconds — ABNORMAL HIGH (ref 11.6–15.2)

## 2014-06-24 MED ORDER — OXYCODONE-ACETAMINOPHEN 5-325 MG PO TABS
1.0000 | ORAL_TABLET | Freq: Four times a day (QID) | ORAL | Status: DC | PRN
Start: 2014-06-24 — End: 2015-05-17

## 2014-06-24 MED ORDER — WARFARIN SODIUM 3 MG PO TABS: 3.0000 mg | ORAL_TABLET | Freq: Every day | ORAL | Status: DC

## 2014-06-24 MED ORDER — OXYCODONE-ACETAMINOPHEN 5-325 MG PO TABS
1.0000 | ORAL_TABLET | Freq: Once | ORAL | Status: AC
  Administered 2014-06-24: 1 via ORAL
  Filled 2014-06-24: qty 1

## 2014-06-24 MED ORDER — NYSTATIN 100000 UNIT/GM EX POWD
Freq: Three times a day (TID) | CUTANEOUS | Status: DC
  Administered 2014-06-24: 14:00:00 via TOPICAL
  Filled 2014-06-24: qty 15

## 2014-06-24 MED ORDER — MORPHINE SULFATE 4 MG/ML IJ SOLN: 4.0000 mg | INTRAMUSCULAR | Status: DC | PRN

## 2014-06-24 NOTE — Progress Notes (Signed)
UR completed 

## 2014-06-24 NOTE — Progress Notes (Signed)
CARE MANAGEMENT NOTE 06/24/2014  Patient:  Dawn Morrow,Dawn Morrow   Account Number:  [id number]  Date Initiated:  06/24/2014  Documentation initiated by:  [facility]  Subjective/Objective Assessment:   back pain     Action/Plan:   SNF   Anticipated DC Date:  06/24/2014   Anticipated DC Plan:  SKILLED NURSING FACILITY  In-house referral  Clinical Social Worker      DC Planning Services  CM consult      Choice offered to / List presented to:             Status of service:  Completed, signed off Medicare Important Message given?  YES (If response is "NO", the following Medicare IM given date fields will be blank) Date Medicare IM given:  06/24/2014 Medicare IM given by:  [facility] Date Additional Medicare IM given:   Additional Medicare IM given by:    Discharge Disposition:  [facility]  Per UR Regulation:    If discussed at Long Length of Stay Meetings, dates discussed:    Comments:  06/24/2014 1545 No NCM needs identified. Plan dc to SNF. Jonnie Finner RN CCM Case Mgmt phone [phone]

## 2014-06-24 NOTE — Progress Notes (Signed)
ANTICOAGULATION CONSULT NOTE - Follow Up  Pharmacy Consult for Warfarin  Indication: h/o pulmonary embolus and DVT  Allergies  Allergen Reactions  . Penicillins Anaphylaxis  . Codeine Other (See Comments)    unknown  . Sulfonamide Derivatives Other (See Comments)    unknown     Vital Signs: Temp: 98.3 F (36.8 C) (10/18 0822) Temp Source: Oral (10/18 0822) BP: 156/71 mmHg (10/18 0822) Pulse Rate: 74 (10/18 0822)  Labs:  Recent Labs  06/22/14 0525 06/22/14 0734 06/22/14 1220 06/23/14 0510 06/23/14 1039 06/24/14 0545  HGB 14.8  --   --  12.0  --  11.6*  HCT 46.4*  --   --  37.6  --  37.9  PLT 190  --   --  182  --  152  LABPROT  --  18.0*  --   --  25.5* 34.4*  INR  --  1.47 1.7  --  2.30* 3.38*  CREATININE 1.17*  --   --  2.01*  --  1.59*    Estimated Creatinine Clearance: 37.4 ml/min (by C-G formula based on Cr of 1.59).   Medical History: Past Medical History  Diagnosis Date  . Asthma   . COPD (chronic obstructive pulmonary disease)   . DVT (deep vein thrombosis) in pregnancy     hx x multiple on coumadin  . Pulmonary embolism     x 2  . GERD (gastroesophageal reflux disease)   . Hypertension   . Osteopenia   . Hypothyroidism   . Hyperlipidemia   . Osteoarthritis     h/o spinal stenosis by MRI 2009  . Hiatal hernia   . Diverticulosis   . Hemorrhoids   . Tubular adenoma of colon 07/2005  . Anxiety and depression   . Cataract   . Stroke     MINI  . Diabetes mellitus      Assessment: 60 yoF with h/o PE, DVT. Presented to ED with c/o hip and back pain. Pharmacy consulted to resume warfarin from home.   Home Warfarin dosing: 3 mg daily (Last dose 06/20/14 1700)  Today 10/18:  INR now supratherapeutic after 5mg  x 2 doses and 3mg  x 1 dose as inpatient  CBC stable  No reported bleeding  Goal of Therapy:  INR 2-3 Monitor platelets by anticoagulation protocol: Yes   Plan:  1) No warfarin today 2) Daily INR   Adrian Saran, PharmD,  BCPS Pager [phone] 06/24/2014 9:24 AM

## 2014-06-24 NOTE — Progress Notes (Signed)
Patient is set to discharge back to [facility] today. Patient aware. Discharge packet in [place], [place] aware. [facility] scheduled for 3:00pm pickup.   Clinical Social Work Department CLINICAL SOCIAL WORK PLACEMENT NOTE 06/24/2014  Patient:  Dawn Morrow,Dawn Morrow  Account Number:  [id number] Admit date:  06/21/2014  Clinical Social Worker:  Renold Genta  Date/time:  06/23/2014 04:08 PM  Clinical Social Work is seeking post-discharge placement for this patient at the following level of care:   SKILLED NURSING   (*CSW will update this form in Epic as items are completed)   06/23/2014  Patient/family provided with [place] Department of Clinical Social Work's list of facilities offering this level of care within the geographic area requested by the patient (or if unable, by the patient's family).  06/23/2014  Patient/family informed of their freedom to choose among providers that offer the needed level of care, that participate in Medicare, Medicaid or managed care program needed by the patient, have an available bed and are willing to accept the patient.  06/23/2014  Patient/family informed of [facility]' ownership interest in [facility], as well as of the fact that they are under no obligation to receive care at this facility.  PASARR submitted to EDS on  PASARR number received on   FL2 transmitted to all facilities in geographic area requested by pt/family on  06/23/2014 FL2 transmitted to all facilities within larger geographic area on   Patient informed that his/her managed care company has contracts with or will negotiate with  certain facilities, including the following:     Patient/family informed of bed offers received:   Patient chooses bed at  Physician recommends and patient chooses bed at    Patient to be transferred to [facility] on  06/24/2014 Patient to be transferred to facility by PT[facility] Patient and family notified of  transfer on 06/24/2014 Name of family member notified:  patient informed family  The following physician request were entered in Epic:   Additional Comments:   Raynaldo Opitz, [place] Social Worker cell #: [phone]

## 2014-06-24 NOTE — Discharge Summary (Signed)
Dawn Morrow, 78 y.o., DOB 02/03/1936, MRN [id number]. Admission date: 06/21/2014 Discharge Date 06/24/2014 Primary MD Kathlene November, MD Admitting Physician Shanda Howells, MD  Admission Diagnosis  Weakness [R53.1] Intractable pain [R52]  -Please follow patient's CBC/BMP last LFTs/INR in 5 days from discharge, INR is 3.38 the day of discharge, will BE held for [place]row and tomorrow, resume on 10/20, please follow INR level and adjust warfarin as needed. -Patient's statin liver where held given her initial elevation of LFTs. -Patient's lisinopril was held on discharge giving her acute renal failure during hospitalization, and hyperkalemia. Discharge Diagnosis   Active Problems:   Intractable pain   Back pain   Lumbar radiculopathy     Past Medical History  Diagnosis Date  . Asthma   . COPD (chronic obstructive pulmonary disease)   . DVT (deep vein thrombosis) in pregnancy     hx x multiple on coumadin  . Pulmonary embolism     x 2  . GERD (gastroesophageal reflux disease)   . Hypertension   . Osteopenia   . Hypothyroidism   . Hyperlipidemia   . Osteoarthritis     h/o spinal stenosis by MRI 2009  . Hiatal hernia   . Diverticulosis   . Hemorrhoids   . Tubular adenoma of colon 07/2005  . Anxiety and depression   . Cataract   . Stroke     MINI  . Diabetes mellitus     Past Surgical History  Procedure Laterality Date  . Appendectomy    . Cholecystectomy    . Tubal ligation    . Tonsillectomy    . Lumbar fusion    . Cataract extraction    . Colonoscopy    . Polypectomy     Brief narrative History of Present Illness:This is a 78 y.o. year old female with significant past medical history of recurrent DVT s/p IVC filter on coumadin, HTN, COPD, TIAs, borderline DM, spinal stenosis presenting with back pain. Pt is a resident of a local ALF. Pt states that she has had severe low back pain Pain has radiation down in to R hip and R leg. Has history of chronic spinal stenosis. On neurontin  as well. Pt states that she was moving too much around during party at ALF. Usually ambulates with rolling walker. No bowel/bladder anesthesia.  Patient had MRI of lumbar spine, which did show show severe L2/L3 spinal stenosis, progressed since 2012.  As well patient was noticed to have elevated LFTs, ultrasound did not show any acute findings, as well hepatitis panel was negative, patient's status would stop, and her LFTs continued to significantly improve. Patient had a slight bump in her creatinine one day prior to discharge from baseline 1.5, to 2 , and her ACE inhibitor was stopped, and she was given IV fluids as it appears secondary to dehydration as her BUN was elevated as well, creatine improved to 1.59 at day of discharge.   Hospital Course See H&P, Labs, Consult and Test reports for all details in brief, patient was admitted for   Active Problems:   Intractable pain   Back pain   Lumbar radiculopathy  Back Pain/Lumbar Radiculopathy  -MRI of lumbar spine done on 10/15 showing Severe stenosis at L2/L3 , worsening from 2012. - GivenIV solumedrol x1 10/15 .  -PT/OT-evaluated the patient, and she started to tolerate activity with pain medicine, vision to be discharged with PT OT followups, and pain medicine as needed, patient has no neurological deficit.  Acute renal failure  -Patient  back to baseline at day of discharge, lisinopril will be held on discharge, special he with her hyperkalemia, as well patient to follow with her PCP in 5 days from discharge to repeat CMP levels to see if it's appropriate to resume lisinopril or not. -He will be resumed back on her Lasix on discharge.  Leukocytosis  -Afebrile,  urinalysis is negative ,related to steroids given on 10/15.   Hyperkalemia  -Patient potassium was 5.4 on 10/17, given Kayexalate , potassium was 4.4 at the of discharge, patient's lisinopril is being held on discharge.  Elevated LFTs  -Denies any Tylenol overuse, normal Tylenol  level.  -No significant finding on ultrasound abdomen , as negative hepatitis panel. -Will discontinue statin on discharge, INR is 3.38 on day of discharge, please hold warfarin for 10/18, 10/19, then to resume on 10/20, and follow with PCP in 5 days to recheck level and adjust warfarin dose accordingly. -cont to improve, follow LFTs with next visit to PCP.  Hx/o DVT  -s/p greenfield filter  -Continue with warfarin on discharge,INR is 3.38 on day of discharge, please hold warfarin for 10/18, 10/19, then to resume on 10/20, and follow with PCP in 5 days to recheck level and adjust warfarin dose accordingly.   COPD  -stable  -cont home regimen  -follow O2 sats and resp status   DM  Patient insulin sliding scale was initiated during this hospitalization given she received Solu-Medrol, but her fingersticks were controlled, though there is no need to start her on new diabetic medication. -A1C 06/2014 7.6      Consults   PT/OT Significant Tests:  See full reports for all details    Mr Brain Wo Contrast  06/22/2014   EXAM: MRI HEAD WITHOUT CONTRAST  TECHNIQUE: Multiplanar, multiecho pulse sequences of the brain and surrounding structures were obtained without intravenous contrast.  COMPARISON:  Prior MRI from 09/18/2013.  FINDINGS: Diffuse prominence of the CSF containing spaces is compatible with generalized cerebral atrophy. Patchy and confluent T2/FLAIR hyperintensity within the periventricular deep white matter both cerebral hemispheres present, most likely related to moderate chronic small vessel ischemic changes.  No abnormal foci of restricted diffusion to suggest acute intracranial infarct identified. Gray-white matter differentiation maintained. Normal flow voids seen within the intracranial vasculature. Left MCA bifurcation aneurysm is grossly stable. There is no intracranial hemorrhage.  No midline shift or mass effect. No mass lesion identified. Ventricles within normal limits  without evidence of hydrocephalus. No extra-axial fluid collection.  Craniocervical junction within normal limits. Pituitary gland unremarkable.  No acute abnormality seen about the orbits.  Paranasal sinuses are well pneumatized and free of fluid. Scattered T2 fluid signal intensity present within the left mastoid air cells. Inner ear structures within normal limits.  Signal intensity within the visualized bone marrow is normal. No scalp soft tissue abnormality.  IMPRESSION: 1. No acute intracranial infarct or other abnormality identified. 2. Generalized atrophy with moderate chronic microvascular ischemic disease. 3. Grossly stable 5 mm left MCA bifurcation aneurysm.   Electronically Signed   By: Jeannine Boga M.D.   On: 06/22/2014 01:26   Mr Lumbar Spine Wo Contrast  06/21/2014   CLINICAL DATA:  Lumbar radiculopathy  EXAM: MRI LUMBAR SPINE WITHOUT CONTRAST  TECHNIQUE: Multiplanar, multisequence MR imaging of the lumbar spine was performed. No intravenous contrast was administered.  COMPARISON:  Lumbar MRI 10/15/2010  FINDINGS: Negative for fracture or mass lesion. Diffuse muscle atrophy. Conus medullaris is normal and terminates at L1-2.  L1-2: Mild disc and  mild facet degeneration without significant spinal stenosis  L2-3: Disc bulging and spondylosis. Bilateral facet degeneration with severe spinal stenosis. Spinal stenosis has progressed since 2012. Moderate foraminal narrowing bilaterally  L3-4: Severe disc space narrowing with fatty changes in the endplates. Prominent left paracentral osteophyte indents the thecal sac and causes left L4 nerve root impingement. This is unchanged. Bilateral facet hypertrophy with moderate spinal stenosis. Moderate left foraminal encroachment  L4-5: Prior decompressive laminectomy bilaterally. Disc degeneration and facet hypertrophy. Mild foraminal narrowing bilaterally.  L5-S1: 8 mm into slip which is unchanged. Posterior bony fusion laminectomy. Foraminal narrowing  bilaterally with possible impingement of the L5 nerve root bilaterally which is unchanged.  IMPRESSION: Severe spinal stenosis at L2-3 has progressed since 2012  Disc degeneration L3-4 with left paracentral osteophyte compressing the left L4 nerve root and causing moderate spinal stenosis, unchanged.  Grade 1 slip L5-S1 with foraminal encroachment bilaterally, unchanged.   Electronically Signed   By: Franchot Gallo M.D.   On: 06/21/2014 19:40   US Abdomen Complete  06/22/2014   CLINICAL DATA:  Elevated LFTs.  EXAM: ULTRASOUND ABDOMEN COMPLETE  COMPARISON:  None.  FINDINGS: Gallbladder: Cholecystectomy.  Common bile duct: Diameter: 9 mm.  Liver: No focal lesion identified. Within normal limits in parenchymal echogenicity.  IVC: No abnormality visualized.  Pancreas: Not visualized.  Spleen: Size and appearance within normal limits.  Right Kidney: Length: 9.9 cm. Echogenicity within normal limits. No mass or hydronephrosis visualized. Lower pole difficult to visualize.  Left Kidney: Length: 2.3 cm. Echogenicity within normal limits. No mass or hydronephrosis visualized.  Abdominal aorta: Not visualized due to overlying bowel gas.  Other findings: None.  IMPRESSION: 1. Cholecystectomy. The common bile duct measures 9 mm. This is most likely from cholecystectomy. If LFTs remained elevated MRCP can be obtained to further evaluate common bile duct mild prominence. Pancreas is not visualized. 2. Liver appears normal.   Electronically Signed   By: Marcello Moores  Register   On: 06/22/2014 09:09     [place]   Subjective:   Dawn Morrow has no headache,no chest abdominal pain,no new weakness tingling or numbness, feels much better wants to go home Mor[place].  Objective:   Blood pressure 156/71, pulse 70, temperature 98.3 F (36.8 C), temperature source Oral, resp. rate 20, height 5\' 6"  (1.676 m), weight 107.502 kg (237 lb), SpO2 88.00%. No intake or output data in the 24 hours ending 06/24/14 1319  Exam Awake Alert,  Oriented *3, No new F.N deficits, Normal affect [place].AT,PERRAL Supple Neck,No JVD, No cervical lymphadenopathy appriciated.  Symmetrical Chest wall movement, Good air movement bilaterally, CTAB RRR,No Gallops,Rubs or new Murmurs, No Parasternal Heave +ve B.Sounds, Abd Soft, Non tender, No organomegaly appriciated, No rebound -guarding or rigidity. No Cyanosis, Clubbing or edema, No new Rash or bruise  Data Review    CBC w Diff:  Lab Results  Component Value Date   WBC 8.0 06/24/2014   HGB 11.6* 06/24/2014   HCT 37.9 06/24/2014   PLT 152 06/24/2014   LYMPHOPCT 18 06/24/2014   MONOPCT 8 06/24/2014   EOSPCT 7* 06/24/2014   BASOPCT 0 06/24/2014   CMP:  Lab Results  Component Value Date   NA 145 06/24/2014   K 4.4 06/24/2014   CL 109 06/24/2014   CO2 26 06/24/2014   BUN 41* 06/24/2014   CREATININE 1.59* 06/24/2014   PROT 5.5* 06/24/2014   ALBUMIN 2.6* 06/24/2014   BILITOT 0.2* 06/24/2014   ALKPHOS 161* 06/24/2014   AST 135* 06/24/2014  ALT 197* 06/24/2014  .  Micro Results No results found for this or any previous visit (from the past 240 hour(s)).   Discharge Instructions     Please forward your PCP in 5 days to repeat CBC, LFTs, BMP, INR. Please follow PT/OT recommendation. Hold her warfarin for Morrow and tomorrow 10/18, 10/19 , and resume your home dose on 10/20.     Follow-up Information   Follow up with Kathlene November, MD In 5 days.   Specialty:  Internal Medicine   Contact information:   [address] [place] [address] [phone]       Discharge Medications     Medication List    STOP taking these medications       atorvastatin 40 MG tablet  Commonly known as:  LIPITOR     lisinopril 10 MG tablet  Commonly known as:  PRINIVIL,ZESTRIL      TAKE these medications       albuterol 108 (90 BASE) MCG/ACT inhaler  Commonly known as:  VENTOLIN HFA  Inhale 2 puffs into the lungs every 4 (four) hours as needed.     aspirin 81 MG  tablet  Take 81 mg by mouth daily.     aspirin-acetaminophen-caffeine 562-563-89 MG per tablet  Commonly known as:  EXCEDRIN MIGRAINE  Take 2 tablets by mouth every 6 (six) hours as needed for headache (headache).     buPROPion 300 MG 24 hr tablet  Commonly known as:  WELLBUTRIN XL  Take 300 mg by mouth daily.     carvedilol 12.5 MG tablet  Commonly known as:  COREG  Take 1 tablet twice a day     ezetimibe 10 MG tablet  Commonly known as:  ZETIA  Take 10 mg by mouth daily.     gabapentin 300 MG capsule  Commonly known as:  NEURONTIN  Take 300 mg by mouth 2 (two) times daily.     levothyroxine 125 MCG tablet  Commonly known as:  SYNTHROID, LEVOTHROID  Take 125 mcg by mouth daily before breakfast.     meclizine 25 MG tablet  Commonly known as:  ANTIVERT  Take 1 tablet (25 mg total) by mouth at bedtime.     MUCINEX DM 30-600 MG Tb12  Take 1 tablet by mouth 2 (two) times daily as needed.     oxyCODONE-acetaminophen 5-325 MG per tablet  Commonly known as:  ROXICET  Take 1 tablet by mouth every 6 (six) hours as needed for severe pain.     warfarin 3 MG tablet  Commonly known as:  COUMADIN  Take 1 tablet (3 mg total) by mouth daily.  Start taking on:  06/26/2014         Total Time in preparing paper work, data evaluation and todays exam - 35 minutes  Monetta Lick M.D on 06/24/2014 at 1:19 PM  [place]  [phone]

## 2014-06-24 NOTE — Discharge Instructions (Signed)
Follow with Primary MD Kathlene November, MD in 5 days   Get CBC, CMP, INR, 2 view Chest X ray checked  by Primary MD next visit. Your warfarin level followed and adjusted by your PCP. Will do warfarin for 10/18, 10/19 then resume on 10/20 , and follow with your PCP in 5 days from discharge.    Activity: As tolerated with Full fall precautions use walker/cane & assistance as needed, Please have PT/OT evaluate, and follow their recommendation.   Disposition ALF   Diet: Heart Healthy  , with feeding assistance and aspiration precautions as needed.  For Heart failure patients - Check your Weight same time everyday, if you gain over 2 pounds, or you develop in leg swelling, experience more shortness of breath or chest pain, call your Primary MD immediately. Follow Cardiac Low Salt Diet and 1.8 lit/day fluid restriction.   On your next visit with your primary care physician please Get Medicines reviewed and adjusted.   Please request your Prim.MD to go over all Hospital Tests and Procedure/Radiological results at the follow up, please get all Hospital records sent to your Prim MD by signing hospital release before you go home.   If you experience worsening of your admission symptoms, develop shortness of breath, life threatening emergency, suicidal or homicidal thoughts you must seek medical attention immediately by calling 911 or calling your MD immediately  if symptoms less severe.  You Must read complete instructions/literature along with all the possible adverse reactions/side effects for all the Medicines you take and that have been prescribed to you. Take any new Medicines after you have completely understood and accpet all the possible adverse reactions/side effects.   Do not drive, operating heavy machinery, perform activities at heights, swimming or participation in water activities or provide baby sitting services if your were admitted for syncope or siezures until you have seen by Primary MD  or a Neurologist and advised to do so again.  Do not drive when taking Pain medications.    Do not take more than prescribed Pain, Sleep and Anxiety Medications  Special Instructions: If you have smoked or chewed Tobacco  in the last 2 yrs please stop smoking, stop any regular Alcohol  and or any Recreational drug use.  Wear Seat belts while driving.   Please note  You were cared for by a hospitalist during your hospital stay. If you have any questions about your discharge medications or the care you received while you were in the hospital after you are discharged, you can call the unit and asked to speak with the hospitalist on call if the hospitalist that took care of you is not available. Once you are discharged, your primary care physician will handle any further medical issues. Please note that NO REFILLS for any discharge medications will be authorized once you are discharged, as it is imperative that you return to your primary care physician (or establish a relationship with a primary care physician if you do not have one) for your aftercare needs so that they can reassess your need for medications and monitor your lab values.

## 2014-06-25 ENCOUNTER — Telehealth: Payer: Self-pay | Admitting: Internal Medicine

## 2014-06-25 NOTE — Telephone Encounter (Signed)
OV notes from 06/12/2014 faxed to [facility].

## 2014-06-25 NOTE — Telephone Encounter (Signed)
Caller name: TARA Relation to pt: [facility]  Call back number: [phone]   Reason for call:   Requesting Last 06/12/14 OV notes please fax [phone]

## 2014-06-25 NOTE — Telephone Encounter (Signed)
Okay orders faxed to [facility].

## 2014-06-25 NOTE — Telephone Encounter (Signed)
Caller name: [facility].  Call back number:[phone]   Reason for call:  Pt just came back from hospital on 10/18.  Has orders for PT and OT.  Wanting orders for OT and Home Nursing

## 2014-06-25 NOTE — Telephone Encounter (Signed)
Spoke with Baxter Flattery at [facility] and confirmed OT and PT orders.

## 2014-06-25 NOTE — Telephone Encounter (Signed)
Please call Baxter Flattery at [facility]. She needs clarification on orders that were just sent

## 2014-06-26 ENCOUNTER — Telehealth: Payer: Self-pay

## 2014-06-26 NOTE — Telephone Encounter (Signed)
Admission date: 06/21/2014  Discharge Date: 06/24/2014  Reason for admission:  Weakness and Intractable pain  Note to Provider:   -Please follow patient's CBC/BMP last LFTs/INR in 5 days from discharge, INR is 3.38 the day of discharge, will BE held for today and tomorrow, resume on 10/20, please follow INR level and adjust warfarin as needed.  -Patient's statin liver where held given her initial elevation of LFTs.  -Patient's lisinopril was held on discharge giving her acute renal failure during hospitalization, and hyperkalemia.  Discharge Instructions  Please forward your PCP in 5 days to repeat CBC, LFTs, BMP, INR.  Please follow PT/OT recommendation.  Hold her warfarin for today and tomorrow 10/18, 10/19 , and resume your home dose on 10/20.

## 2014-06-26 NOTE — Telephone Encounter (Signed)
Dawn Morrow - [phone] [name] called for verbal orders, her recommendation is OT 3 times a week for 2 weeks and 2 times a week for 3 weeks.

## 2014-06-26 NOTE — Telephone Encounter (Signed)
Left a message for call back.  

## 2014-06-26 NOTE — Telephone Encounter (Signed)
Ria Comment from [facility] is returning your call regarding pt, would like for you to call her back on [phone]

## 2014-06-26 NOTE — Telephone Encounter (Signed)
Admission date: 06/21/2014  Discharge Date: 06/24/2014   Reason for admission: Weakness and Intractable pain   Note to Provider:  -Please follow patient's CBC/BMP last LFTs/INR in 5 days from discharge, INR is 3.38 the day of discharge, will BE held for today and tomorrow, resume on 10/20, please follow INR level and adjust warfarin as needed.  -Patient's statin liver where held given her initial elevation of LFTs.  -Patient's lisinopril was held on discharge giving her acute renal failure during hospitalization, and hyperkalemia.   Discharge Instructions  Please forward your PCP in 5 days to repeat CBC, LFTs, BMP, INR.  Please follow PT/OT recommendation.  Hold her warfarin for today and tomorrow 10/18, 10/19 , and resume your home dose on 10/20.  Information below provided by Ria Comment, LPN and patient  Transition Care Management Follow-up Telephone Call  How have you been since you were released from the hospital? Pt said to be doing well.  Has been mostly in the bed.  Pain mildly controlled with oxycodone.  Pt allergic to codeine--no allergic reaction noted when taking oxycodone.     Do you understand why you were in the hospital? yes   Do you understand the discharge instructions? yes  Items Reviewed:  Medications reviewed: yes, Ria Comment stated that a copy of patient's [facility] will accompany her during office visit tomorrow.    Allergies reviewed: yes  Dietary changes reviewed: yes  Referrals reviewed: n/a   Functional Questionnaire:   Activities of Daily Living (ADLs):   She states they are independent in the following: ambulation, bathing and hygiene, feeding, continence, grooming, toileting and dressing States they require assistance with the following: n/a   Any transportation issues/concerns?: no   Any patient concerns? Yes, pain not well controlled.  Pt states that whenever she's resting or sitting down her pain is not as bad, but when she stands and moves about  pain increases.     Confirmed importance and date/time of follow-up visits scheduled: yes   Confirmed with patient if condition begins to worsen call PCP or go to the ER.  Patient was given the Call-a-Nurse line [phone]: yes   Hospital follow up scheduled with Dr. Larose Kells on 06/27/14 @ 11:30 am.

## 2014-06-26 NOTE — Telephone Encounter (Signed)
Caller name: Zunaira Relation to pt: Call back number: [phone] Pharmacy:  Reason for call: Pt is returning call. Please advise.

## 2014-06-26 NOTE — Telephone Encounter (Signed)
Will see her as schedule

## 2014-06-27 ENCOUNTER — Ambulatory Visit (INDEPENDENT_AMBULATORY_CARE_PROVIDER_SITE_OTHER): Payer: Medicare Other | Admitting: Internal Medicine

## 2014-06-27 ENCOUNTER — Encounter: Payer: Self-pay | Admitting: Internal Medicine

## 2014-06-27 VITALS — BP 132/84 | HR 70 | Temp 97.6°F | Wt 225.5 lb

## 2014-06-27 DIAGNOSIS — R52 Pain, unspecified: Secondary | ICD-10-CM

## 2014-06-27 DIAGNOSIS — J449 Chronic obstructive pulmonary disease, unspecified: Secondary | ICD-10-CM

## 2014-06-27 DIAGNOSIS — M5416 Radiculopathy, lumbar region: Secondary | ICD-10-CM

## 2014-06-27 DIAGNOSIS — I10 Essential (primary) hypertension: Secondary | ICD-10-CM

## 2014-06-27 LAB — CBC WITH DIFFERENTIAL/PLATELET
Basophils Absolute: 0 10*3/uL (ref 0.0–0.1)
Basophils Relative: 0.4 % (ref 0.0–3.0)
Eosinophils Absolute: 0.5 10*3/uL (ref 0.0–0.7)
Eosinophils Relative: 4.8 % (ref 0.0–5.0)
HCT: 43 % (ref 36.0–46.0)
Hemoglobin: 13.5 g/dL (ref 12.0–15.0)
Lymphocytes Relative: 20 % (ref 12.0–46.0)
Lymphs Abs: 2.1 10*3/uL (ref 0.7–4.0)
MCHC: 31.4 g/dL (ref 30.0–36.0)
MCV: 91.7 fl (ref 78.0–100.0)
Monocytes Absolute: 0.8 10*3/uL (ref 0.1–1.0)
Monocytes Relative: 7.7 % (ref 3.0–12.0)
Neutro Abs: 6.9 10*3/uL (ref 1.4–7.7)
Neutrophils Relative %: 67.1 % (ref 43.0–77.0)
Platelets: 198 10*3/uL (ref 150.0–400.0)
RBC: 4.69 Mil/uL (ref 3.87–5.11)
RDW: 15.8 % — ABNORMAL HIGH (ref 11.5–15.5)
WBC: 10.3 10*3/uL (ref 4.0–10.5)

## 2014-06-27 LAB — COMPREHENSIVE METABOLIC PANEL
ALT: 85 U/L — ABNORMAL HIGH (ref 0–35)
AST: 29 U/L (ref 0–37)
Albumin: 3 g/dL — ABNORMAL LOW (ref 3.5–5.2)
Alkaline Phosphatase: 129 U/L — ABNORMAL HIGH (ref 39–117)
BUN: 19 mg/dL (ref 6–23)
CO2: 32 mEq/L (ref 19–32)
Calcium: 8.9 mg/dL (ref 8.4–10.5)
Chloride: 102 mEq/L (ref 96–112)
Creatinine, Ser: 1.5 mg/dL — ABNORMAL HIGH (ref 0.4–1.2)
GFR: 35.85 mL/min — ABNORMAL LOW (ref >60.00)
Glucose, Bld: 105 mg/dL — ABNORMAL HIGH (ref 70–99)
Potassium: 4.1 mEq/L (ref 3.5–5.1)
Sodium: 142 mEq/L (ref 135–145)
Total Bilirubin: 0.9 mg/dL (ref 0.2–1.2)
Total Protein: 6.9 g/dL (ref 6.0–8.3)

## 2014-06-27 NOTE — Telephone Encounter (Signed)
That is ok 

## 2014-06-27 NOTE — Patient Instructions (Signed)
Get your blood work before you leave   Continue with the  same Coumadin dose,  be seen at the Coumadin clinic in 2 weeks  Checked her blood pressure 4 times a week, call if more than 150/85 were placed on 110/60 consistently  Okay to take oxycodone as needed  Next visit in 2 months

## 2014-06-27 NOTE — Telephone Encounter (Signed)
Spoke with Remo Lipps at [facility], gave verbal orders for OT, she explained to me that they will probably do PT for Pt after finishing OT because of back pain that the Pt is experiencing.

## 2014-06-27 NOTE — Telephone Encounter (Signed)
Please advise 

## 2014-06-27 NOTE — Progress Notes (Signed)
Pre visit review using our clinic review tool, if applicable. No additional management support is needed unless otherwise documented below in the visit note. 

## 2014-06-27 NOTE — Progress Notes (Signed)
Subjective:    Patient ID: Dawn Morrow, female    DOB: 1936/09/05, 78 y.o.   MRN: [id number]  DOS:  06/27/2014 Type of visit - description : Hospital followup, chart reviewed, excerts:   Admission date: 06/21/2014  Discharge Date 06/24/2014   Admission Diagnosis  Intractable pain [R52]  -Please follow patient's CBC/BMP last LFTs/INR in 5 days from discharge, INR is 3.38 the day of discharge, will BE held for today and tomorrow, resume on 10/20, please follow INR level and adjust warfarin as needed.  -Patient's statin liver where held given her initial elevation of LFTs.  -Patient's lisinopril was held on discharge giving her acute renal failure during hospitalization, and hyperkalemia.     Back Pain/Lumbar Radiculopathy  -MRI of lumbar spine done on 10/15 showing Severe stenosis at L2/L3 , worsening from 2012.  - GivenIV solumedrol x1 10/15 .  -PT/OT-evaluated the patient, and she started to tolerate activity with pain medicine, vision to be discharged with PT OT followups, and pain medicine as needed, patient has no neurological deficit.  Acute renal failure  -Patient back to baseline at day of discharge, lisinopril will be held on discharge, special he with her hyperkalemia, as well patient to follow with her PCP in 5 days from discharge to repeat CMP levels to see if it's appropriate to resume lisinopril or not.  -He will be resumed back on her Lasix on discharge.  Leukocytosis  -Afebrile, urinalysis is negative ,related to steroids given on 10/15.  Hyperkalemia  -Patient potassium was 5.4 on 10/17, given Kayexalate , potassium was 4.4 at the of discharge, patient's lisinopril is being held on discharge.  Elevated LFTs  -Denies any Tylenol overuse, normal Tylenol level.  -No significant finding on ultrasound abdomen , as negative hepatitis panel.  -Will discontinue statin on discharge, INR is 3.38 on day of discharge, please hold warfarin for 10/18, 10/19, then to resume on 10/20,  and follow with PCP in 5 days to recheck level and adjust warfarin dose accordingly.  -cont to improve, follow LFTs with next visit to PCP.  Hx/o DVT  -s/p greenfield filter  -Continue with warfarin on discharge,INR is 3.38 on day of discharge, please hold warfarin for 10/18, 10/19, then to resume on 10/20, and follow with PCP in 5 days to recheck level and adjust warfarin dose accordingly.  COPD  -stable  -cont home regimen  -follow O2 sats and resp status  DM  Patient insulin sliding scale was initiated during this hospitalization given she received Solu-Medrol, but her fingersticks were controlled, though there is no need to start her on new diabetic medication.  -A1C 06/2014 7.6    ROS She is here w/ Seth Bake from [place]. Since she left the hospital, good compliance with the plan. Denies chest pain or difficulty breathing, some runny nose, developing a cold? No nausea, vomiting, diarrhea Continue with pain, pharmacist at [facility]  he has been hesitant to provide oxycodone because she has an allergy to codeine, on chart review that was not true allergy, rather had GI side effects.   Past Medical History  Diagnosis Date  . Asthma   . COPD (chronic obstructive pulmonary disease)   . DVT (deep vein thrombosis) in pregnancy     hx x multiple on coumadin  . Pulmonary embolism     x 2  . GERD (gastroesophageal reflux disease)   . Hypertension   . Osteopenia   . Hypothyroidism   . Hyperlipidemia   . Osteoarthritis  h/o spinal stenosis by MRI 2009  . Hiatal hernia   . Diverticulosis   . Hemorrhoids   . Tubular adenoma of colon 07/2005  . Anxiety and depression   . Cataract   . Stroke     MINI  . Diabetes mellitus     Past Surgical History  Procedure Laterality Date  . Appendectomy    . Cholecystectomy    . Tubal ligation    . Tonsillectomy    . Lumbar fusion    . Cataract extraction    . Colonoscopy    . Polypectomy      History   Social History  .  Marital Status: Divorced    Spouse Name: N/A    Number of Children: 3  . Years of Education: N/A   Occupational History  . retired    Social History Main Topics  . Smoking status: Former Smoker    Types: Cigarettes  . Smokeless tobacco: Never Used  . Alcohol Use: No     Comment: socially  . Drug Use: No  . Sexual Activity: Not on file   Other Topics Concern  . Not on file   Social History Narrative   Single,   lost her sister in 2012. 3 children , all live in [place] to [place] late 10-2013   [employer], lives in [place]; nice [place] in [place] [phone], [phone])   Uses a diaper.     Doesn't drive          Medication List       This list is accurate as of: 06/27/14 11:59 PM.  Always use your most recent med list.               albuterol 108 (90 BASE) MCG/ACT inhaler  Commonly known as:  VENTOLIN HFA  Inhale 2 puffs into the lungs every 4 (four) hours as needed.     aspirin 81 MG tablet  Take 81 mg by mouth daily.     aspirin-acetaminophen-caffeine 683-419-62 MG per tablet  Commonly known as:  EXCEDRIN MIGRAINE  Take 2 tablets by mouth every 6 (six) hours as needed for headache (headache).     buPROPion 300 MG 24 hr tablet  Commonly known as:  WELLBUTRIN XL  Take 300 mg by mouth daily.     carvedilol 12.5 MG tablet  Commonly known as:  COREG  Take 1 tablet twice a day     ezetimibe 10 MG tablet  Commonly known as:  ZETIA  Take 10 mg by mouth daily.     gabapentin 300 MG capsule  Commonly known as:  NEURONTIN  Take 300 mg by mouth 2 (two) times daily.     levothyroxine 125 MCG tablet  Commonly known as:  SYNTHROID, LEVOTHROID  Take 125 mcg by mouth daily before breakfast.     meclizine 25 MG tablet  Commonly known as:  ANTIVERT  Take 1 tablet (25 mg total) by mouth at bedtime.     MUCINEX DM 30-600 MG Tb12  Take 1 tablet by mouth 2 (two) times daily as needed.     oxyCODONE-acetaminophen 5-325 MG per tablet  Commonly  known as:  ROXICET  Take 1 tablet by mouth every 6 (six) hours as needed for severe pain.     warfarin 3 MG tablet  Commonly known as:  COUMADIN  Take 1 tablet (3 mg total) by mouth daily.  Objective:   Physical Exam BP 132/84  Pulse 70  Temp(Src) 97.6 F (36.4 C) (Oral)  Wt 225 lb 8 oz (102.286 kg)  SpO2 95%  General -- alert, well-developed, NAD.   HEENT-- nose is slightly congested Lungs -- normal respiratory effort, no intercostal retractions, no accessory muscle use, and normal breath sounds.  Heart-- normal rate, regular rhythm, no murmur.   Extremities-- trace pretibial edema bilaterally  Neurologic--   Speech  , gait and motor-- at baseline, uses a walker Psych--   No anxious or depressed appearing.       Assessment & Plan:   Intractable back pain, Status post admission, gauze better with IV steroids, current plan is to PT/OT and try OxyContin. After chart review, I wrote a note for the pharmacist, okay to try oxycodone despite allergy to clothing, she actually had GI symptoms.  Increased LFTs during the last admission, recheck LFTs  Hypertension, acute renal failure, Hyperkalemia. Creatinine was elevated while at the hospital, she's not taking lisinopril, on Lasix . BP today is satisfactory Plan: Check BMP, continue monitoring BP, if BP is elevated consider restart lisinopril.  INR: INR was elevated, she held Coumadin for couple of days, bicarbonate since yesterday. INR today 1.9. Plan: Continue same Coumadin, needs to be rechecked in 2 weeks.  COPD, developing a URI? She does have mild runny nose, otherwise lungs are clear. Recommend observation

## 2014-07-10 ENCOUNTER — Telehealth: Payer: Self-pay | Admitting: *Deleted

## 2014-07-10 DIAGNOSIS — J441 Chronic obstructive pulmonary disease with (acute) exacerbation: Secondary | ICD-10-CM

## 2014-07-10 DIAGNOSIS — M5416 Radiculopathy, lumbar region: Secondary | ICD-10-CM

## 2014-07-10 DIAGNOSIS — I10 Essential (primary) hypertension: Secondary | ICD-10-CM

## 2014-07-10 DIAGNOSIS — J45909 Unspecified asthma, uncomplicated: Secondary | ICD-10-CM

## 2014-07-10 NOTE — Telephone Encounter (Signed)
Received home health certification and plan of care via fax. Forms forwarded to Dr. Larose Kells. JG//CMA

## 2014-07-11 NOTE — Telephone Encounter (Signed)
Signed forms faxed to [facility]. JG//CMA

## 2014-07-19 ENCOUNTER — Ambulatory Visit: Payer: Medicare Other | Admitting: Neurology

## 2014-07-20 ENCOUNTER — Telehealth: Payer: Self-pay | Admitting: *Deleted

## 2014-07-20 NOTE — Telephone Encounter (Signed)
Received home health certification via fax from [facility]. Forms forwarded to Dr. Larose Kells. JG//CMA

## 2014-07-24 ENCOUNTER — Telehealth: Payer: Self-pay

## 2014-07-24 DIAGNOSIS — I10 Essential (primary) hypertension: Secondary | ICD-10-CM

## 2014-07-24 DIAGNOSIS — J45909 Unspecified asthma, uncomplicated: Secondary | ICD-10-CM

## 2014-07-24 DIAGNOSIS — M5416 Radiculopathy, lumbar region: Secondary | ICD-10-CM

## 2014-07-24 DIAGNOSIS — J441 Chronic obstructive pulmonary disease with (acute) exacerbation: Secondary | ICD-10-CM

## 2014-07-24 NOTE — Telephone Encounter (Signed)
Dawn Morrow from [facility] called requesting verbal orders for OT regarding Pts decreased mobility, and decreased strength. Verbal orders given to begin OT.

## 2014-07-25 ENCOUNTER — Telehealth: Payer: Self-pay | Admitting: Internal Medicine

## 2014-07-25 ENCOUNTER — Telehealth: Payer: Self-pay | Admitting: *Deleted

## 2014-07-25 NOTE — Telephone Encounter (Signed)
Forms faxed to [facility]. JG//CMA

## 2014-07-25 NOTE — Telephone Encounter (Signed)
Faxed request for INR results, and current dosage to [facility] at [phone].

## 2014-07-25 NOTE — Telephone Encounter (Signed)
Call [facility], the patient is due for an INR. Schedule office visit for inr check If unable to come they need to check INR at home and send me the results along with the current dose of Coumadin.

## 2014-07-25 NOTE — Telephone Encounter (Signed)
Signed forms faxed to [facility]. JG//CMA

## 2014-07-25 NOTE — Telephone Encounter (Signed)
Received overnight oximetry order form via fax from [employer]. Form forwarded to Dr. Larose Kells. JG//CMA

## 2014-07-26 ENCOUNTER — Telehealth: Payer: Self-pay | Admitting: Internal Medicine

## 2014-07-26 NOTE — Telephone Encounter (Signed)
Caller name: smita patel--physical therapist with [facility] Relation to pt: Call back number: [phone] Pharmacy:  Reason for call:   Ms. patel needs order for PT for one time this week and twice a week for the next four weeks for low back pain mgmt and mobility safety training

## 2014-07-26 NOTE — Telephone Encounter (Signed)
Verbal orders given to Smita at [facility] for PT.

## 2014-07-30 ENCOUNTER — Telehealth: Payer: Self-pay | Admitting: Certified Registered Nurse Anesthetist

## 2014-07-30 NOTE — Telephone Encounter (Signed)
She is taking 3mg  daily except for Saturday she takes non.  Total of 18mg  per week.

## 2014-07-30 NOTE — Telephone Encounter (Signed)
Instructions faxed to [facility].

## 2014-07-30 NOTE — Telephone Encounter (Signed)
Plan: Coumadin 3 mg: 2 tabs today Then 1 tablet daily inr in 2 weeks (If not at goal will consider switch to xarelto )

## 2014-07-30 NOTE — Telephone Encounter (Signed)
Spoke with Seth Bake at [facility].  Patient's INR was 1.7 on 07-26-14.  She is currently taking mg daily except for none on Saturday.  INR goal is 2-3.  Please advise and fax orders to [phone].

## 2014-08-06 ENCOUNTER — Other Ambulatory Visit: Payer: Self-pay

## 2014-08-10 LAB — PROTIME-INR

## 2014-08-15 ENCOUNTER — Ambulatory Visit (INDEPENDENT_AMBULATORY_CARE_PROVIDER_SITE_OTHER): Payer: Medicare Other | Admitting: Internal Medicine

## 2014-08-15 ENCOUNTER — Encounter: Payer: Self-pay | Admitting: Internal Medicine

## 2014-08-15 VITALS — BP 128/76 | HR 62 | Temp 97.8°F | Wt 230.4 lb

## 2014-08-15 DIAGNOSIS — J449 Chronic obstructive pulmonary disease, unspecified: Secondary | ICD-10-CM

## 2014-08-15 DIAGNOSIS — E034 Atrophy of thyroid (acquired): Secondary | ICD-10-CM

## 2014-08-15 DIAGNOSIS — R7989 Other specified abnormal findings of blood chemistry: Secondary | ICD-10-CM

## 2014-08-15 DIAGNOSIS — I10 Essential (primary) hypertension: Secondary | ICD-10-CM

## 2014-08-15 DIAGNOSIS — E038 Other specified hypothyroidism: Secondary | ICD-10-CM

## 2014-08-15 DIAGNOSIS — R945 Abnormal results of liver function studies: Secondary | ICD-10-CM

## 2014-08-15 DIAGNOSIS — Z7901 Long term (current) use of anticoagulants: Secondary | ICD-10-CM

## 2014-08-15 LAB — TSH: TSH: 2.7 u[IU]/mL (ref 0.35–4.50)

## 2014-08-15 LAB — ALT: ALT: 16 U/L (ref 0–35)

## 2014-08-15 LAB — AST: AST: 22 U/L (ref 0–37)

## 2014-08-15 LAB — POCT INR: INR: 1.9

## 2014-08-15 NOTE — Progress Notes (Signed)
Subjective:    Patient ID: Dawn Morrow, female    DOB: 1935/09/23, 78 y.o.   MRN: [id number]  DOS:  08/15/2014 Type of visit - description : f/u Interval history: Since the last office visit she is doing well. Good compliance of medication Pain well controlled    ROS Denies fever chills No nausea, vomiting, diarrhea Cough has decrease significantly  Past Medical History  Diagnosis Date  . Asthma   . COPD (chronic obstructive pulmonary disease)   . DVT (deep vein thrombosis) in pregnancy     hx x multiple on coumadin  . Pulmonary embolism     x 2  . GERD (gastroesophageal reflux disease)   . Hypertension   . Osteopenia   . Hypothyroidism   . Hyperlipidemia   . Osteoarthritis     h/o spinal stenosis by MRI 2009  . Hiatal hernia   . Diverticulosis   . Hemorrhoids   . Tubular adenoma of colon 07/2005  . Anxiety and depression   . Cataract   . Stroke     MINI  . Diabetes mellitus     Past Surgical History  Procedure Laterality Date  . Appendectomy    . Cholecystectomy    . Tubal ligation    . Tonsillectomy    . Lumbar fusion    . Cataract extraction    . Colonoscopy    . Polypectomy      History   Social History  . Marital Status: Divorced    Spouse Name: N/A    Number of Children: 3  . Years of Education: N/A   Occupational History  . retired    Social History Main Topics  . Smoking status: Former Smoker    Types: Cigarettes  . Smokeless tobacco: Never Used  . Alcohol Use: No     Comment: socially  . Drug Use: No  . Sexual Activity: Not on file   Other Topics Concern  . Not on file   Social History Narrative   Single,   lost her sister in 2012. 3 children , all live in [place] to [place] late 10-2013   [employer], lives in [place]; nice [place] in [place] [phone], [phone])   Uses a diaper.     Doesn't drive          Medication List       This list is accurate as of: 08/15/14 11:59 PM.  Always use your most  recent med list.               albuterol 108 (90 BASE) MCG/ACT inhaler  Commonly known as:  VENTOLIN HFA  Inhale 2 puffs into the lungs every 4 (four) hours as needed.     aspirin 81 MG tablet  Take 81 mg by mouth daily.     aspirin-acetaminophen-caffeine 841-324-40 MG per tablet  Commonly known as:  EXCEDRIN MIGRAINE  Take 2 tablets by mouth every 6 (six) hours as needed for headache (headache).     buPROPion 300 MG 24 hr tablet  Commonly known as:  WELLBUTRIN XL  Take 300 mg by mouth daily.     carvedilol 12.5 MG tablet  Commonly known as:  COREG  Take 1 tablet twice a day     ezetimibe 10 MG tablet  Commonly known as:  ZETIA  Take 10 mg by mouth daily.     gabapentin 300 MG capsule  Commonly known as:  NEURONTIN  Take 300 mg  by mouth 2 (two) times daily.     levothyroxine 125 MCG tablet  Commonly known as:  SYNTHROID, LEVOTHROID  Take 125 mcg by mouth daily before breakfast.     meclizine 25 MG tablet  Commonly known as:  ANTIVERT  Take 1 tablet (25 mg total) by mouth at bedtime.     MUCINEX DM 30-600 MG Tb12  Take 1 tablet by mouth 2 (two) times daily as needed.     oxyCODONE-acetaminophen 5-325 MG per tablet  Commonly known as:  ROXICET  Take 1 tablet by mouth every 6 (six) hours as needed for severe pain.     warfarin 3 MG tablet  Commonly known as:  COUMADIN  Take 1 tablet (3 mg total) by mouth daily.           Objective:   Physical Exam BP 128/76 mmHg  Pulse 62  Temp(Src) 97.8 F (36.6 C) (Oral)  Wt 230 lb 6 oz (104.497 kg)  SpO2 95% General -- alert, well-developed, NAD.   Lungs -- normal respiratory effort, no intercostal retractions, no accessory muscle use, and decreased breath sounds.  Heart-- normal rate, regular rhythm, no murmur.   Extremities-- trace pretibial edema bilaterally  Neurologic--   Speech , gait motor at baseline  Psych--  No anxious or depressed appearing.      Assessment & Plan:    Intractable back  pain, Currently pain is well controlled Increase LFTs LFTs were trending down, recheck today INR  Current Coumadin is 3 mg daily, INR today 1.9 Plan: no change, recheck in 2 weeks, will as [facility] to fax [place] the results  Follow-up in 2 months

## 2014-08-15 NOTE — Progress Notes (Signed)
Pre visit review using our clinic review tool, if applicable. No additional management support is needed unless otherwise documented below in the visit note. 

## 2014-08-15 NOTE — Patient Instructions (Addendum)
Get your blood work before you leave    Continue with Coumadin 3 mg every day We need to check an INR in 2 weeks, they need to fax [place] the results   Please come back to the office in 2 months  for a routine check up

## 2014-08-16 ENCOUNTER — Telehealth: Payer: Self-pay | Admitting: Internal Medicine

## 2014-08-16 NOTE — Telephone Encounter (Signed)
Spoke with Tiffany at [employer], whom informed me that [facility] does not supply oxygen to Pts who live in assisted living or nursing homes but was able to get me in contact with [place] at [facility], informed me that Pt would not be eligible for 24/7 oxygen due to oximetry results, but informed me that she can get Pt set up with oxygen concentrators for use when she needs. Dawn brought by forms to be completed by Dr. Larose Kells, forms have been completed and given back to [facility]. Dawn informed me she would get the paperwork started on Pts oxygen today.

## 2014-08-16 NOTE — Assessment & Plan Note (Signed)
Hypothyroidism Base on last TSH, Synthroid dose was decreased to 125 mcg, check a TSH

## 2014-08-16 NOTE — Assessment & Plan Note (Addendum)
COPD Continue with albuterol as needed. Overnight  O2 sat decrease per oxymetry 08-08-14 (twest was ordered by another MD but faxed to me) , will need home O2-- referred to [facility]

## 2014-08-16 NOTE — Assessment & Plan Note (Signed)
Hypertension Last BMP show normal potassium and creatinine was close to baseline. She continue without ACEi or Lasix since admission to the hospital in October and BP seems well-controlled. Plan: Continue with carvedilol.

## 2014-08-16 NOTE — Telephone Encounter (Signed)
24 hour pulse oximetry show severe desaturations. Please call a  home health agency, she needs oxygen 24/ 7, DX COPD

## 2014-08-17 ENCOUNTER — Telehealth: Payer: Self-pay | Admitting: Internal Medicine

## 2014-08-20 ENCOUNTER — Encounter: Payer: Self-pay | Admitting: Neurology

## 2014-08-20 ENCOUNTER — Ambulatory Visit (INDEPENDENT_AMBULATORY_CARE_PROVIDER_SITE_OTHER): Payer: Medicare Other | Admitting: Neurology

## 2014-08-20 VITALS — BP 128/70 | HR 61 | Resp 18 | Ht 65.0 in | Wt 230.0 lb

## 2014-08-20 DIAGNOSIS — G250 Essential tremor: Secondary | ICD-10-CM

## 2014-08-20 DIAGNOSIS — G5602 Carpal tunnel syndrome, left upper limb: Secondary | ICD-10-CM

## 2014-08-20 MED ORDER — GABAPENTIN 300 MG PO CAPS: 300.0000 mg | ORAL_CAPSULE | Freq: Three times a day (TID) | ORAL | Status: DC

## 2014-08-20 NOTE — Patient Instructions (Signed)
1. Increase gabapentin 300mg : Take 1 capsule three times a day 2. Schedule EMG/NCV of the left UE for possible carpal tunnel syndrome 3. Follow-up in 4-5 months

## 2014-08-20 NOTE — Progress Notes (Deleted)
Stopped knitting, got burned  No side effects on gabapentin Spills peas No cogwheeling, action tremor No jaw or leg tremor

## 2014-08-21 NOTE — Telephone Encounter (Signed)
Completed/signed CMN form faxed to [facility]. JG//CMA

## 2014-08-22 ENCOUNTER — Telehealth: Payer: Self-pay | Admitting: Neurology

## 2014-08-22 ENCOUNTER — Telehealth: Payer: Self-pay | Admitting: Internal Medicine

## 2014-08-22 NOTE — Telephone Encounter (Signed)
Caller name: Smita Patel--[facility] Relation to pt: Call back number: [phone] Pharmacy:  Reason for call:   Needs order for verification on O2 supplement. Will be seeing patient tomorrow. She faxed over order today. She wants to know if nursing can see patient for O2 safety and teaching.

## 2014-08-22 NOTE — Telephone Encounter (Signed)
Signed by Dr. Paz and faxed back to [facility].  

## 2014-08-22 NOTE — Telephone Encounter (Signed)
Received, given to Dr. Larose Kells for review and signing.

## 2014-08-22 NOTE — Telephone Encounter (Signed)
Pt resdch appt to for EMG to 10-04-14

## 2014-08-23 ENCOUNTER — Telehealth: Payer: Self-pay | Admitting: Internal Medicine

## 2014-08-23 NOTE — Telephone Encounter (Signed)
Spoke with [facility], verbal orders given.

## 2014-08-23 NOTE — Telephone Encounter (Signed)
That is ok 

## 2014-08-23 NOTE — Telephone Encounter (Signed)
Pt also needs order to continue physical therapy twice a week for 8 weeks balance and geit, lower extremity exercises and monitor her oxygen level with activity.  [facility] order please call Smita [phone]

## 2014-08-23 NOTE — Telephone Encounter (Signed)
Okay to give verbal? Please advise 

## 2014-08-23 NOTE — Progress Notes (Signed)
NEUROLOGY FOLLOW UP OFFICE NOTE  Dawn Morrow [id number]  HISTORY OF PRESENT ILLNESS: I had the pleasure of seeing Dawn Morrow in follow-up in the neurology clinic on 08/20/2014.  The patient was last seen 4 months ago for essential tremors and left hand numbness. She was advised to wear a wrist splint. She had also started gabapentin, currently on 300mg  BID with no side effects. She continues to have left hand numbness, no clear weakness. She has stopped knitting because she got burned out, not due to the tremors. She occasionally spills her peas due to tremor. She denies any headaches, diplopia, dysarthria, focal weakness, bowel/bladder dysfunction.   HPI: This is a pleasant 78 yo RH woman with multiple medical problems, including a history of asthma, COPD, DVT and PE on chronic anticoagulation, tremors, who was admitted to [facility] from January 12-15, 2015 when she fell at home with generalized weakness and left arm tingling. On admission, INR was 1.87. She had an MRI brain which I personally reviewed, no acute changes, moderate chronic small vessel ischemic disease. Remote lacunar infarct in the left putamen with associated susceptibility artifact suggestive of remote hemorrhage. There is mild to moderate cerebral atrophy. 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 Echo unremarakable, Carotid Dopplers with no significant ICA stenosis. LDL is 79. TSH is 3.05. She was resumed on Coumadin, last INR on discharge 09/21/12 was 2.43. She was noted to be hypertensive, Coreg and lisinopril were restarted, with note of improvement. Hemoglobin A1c was 6.6.   Since hospital discharge, she reported that her left hand continues to be numb. It does not affect the forearm/arm. Her left foot is numb as well. She denies any pain. No facial involvement. She also has bilateral hand tremors, which she reports started at  age 32 or 64. 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 Occasionally her legs feels shaky when lying in bed. She is adopted and unaware of family history of tremors, however her grandson and great grandson have tremors.   PAST MEDICAL HISTORY: Past Medical History  Diagnosis Date  . Asthma   . COPD (chronic obstructive pulmonary disease)   . DVT (deep vein thrombosis) in pregnancy     hx x multiple on coumadin  . Pulmonary embolism     x 2  . GERD (gastroesophageal reflux disease)   . Hypertension   . Osteopenia   . Hypothyroidism   . Hyperlipidemia   . Osteoarthritis     h/o spinal stenosis by MRI 2009  . Hiatal hernia   . Diverticulosis   . Hemorrhoids   . Tubular adenoma of colon 07/2005  . Anxiety and depression   . Cataract   . Stroke     MINI  . Diabetes mellitus     MEDICATIONS: Current Outpatient Prescriptions on File Prior to Visit  Medication Sig Dispense Refill  . albuterol (VENTOLIN HFA) 108 (90 BASE) MCG/ACT inhaler Inhale 2 puffs into the lungs every 4 (four) hours as needed. 1 Inhaler 6  . aspirin 81 MG tablet Take 81 mg by mouth daily.    Marland Kitchen aspirin-acetaminophen-caffeine (EXCEDRIN MIGRAINE) 250-250-65 MG per tablet Take 2 tablets by mouth every 6 (six) hours as needed for headache (headache).     Marland Kitchen buPROPion (WELLBUTRIN XL) 300 MG  24 hr tablet Take 300 mg by mouth daily.    . carvedilol (COREG) 12.5 MG tablet Take 1 tablet twice a day 180 tablet 3  . Dextromethorphan-Guaifenesin (MUCINEX DM) 30-600 MG TB12 Take 1 tablet by mouth 2 (two) times daily as needed.    . ezetimibe (ZETIA) 10 MG tablet Take 10 mg by mouth daily.    Marland Kitchen levothyroxine (SYNTHROID, LEVOTHROID) 125 MCG tablet Take 125 mcg by mouth daily before breakfast.    . meclizine (ANTIVERT) 25 MG tablet Take 1 tablet (25 mg total) by mouth at bedtime. 30 tablet 6  .  oxyCODONE-acetaminophen (ROXICET) 5-325 MG per tablet Take 1 tablet by mouth every 6 (six) hours as needed for severe pain. 30 tablet 0  . warfarin (COUMADIN) 3 MG tablet Take 1 tablet (3 mg total) by mouth daily.    Gabapentin 300mg  BID Current Facility-Administered Medications on File Prior to Visit  Medication Dose Route Frequency Provider Last Rate Last Dose  . zoledronic acid (RECLAST) injection 5 mg  5 mg Intravenous Once Colon Branch, MD        ALLERGIES: Allergies  Allergen Reactions  . Penicillins Anaphylaxis  . Codeine Other (See Comments)    unknown  . Sulfonamide Derivatives Other (See Comments)    unknown    FAMILY HISTORY: Family History  Problem Relation Age of Onset  . Adopted: Yes  . Cancer Mother     ? colon or ovarian  . Diabetes Mother   . Cancer Brother     ?    SOCIAL HISTORY: History   Social History  . Marital Status: Divorced    Spouse Name: N/A    Number of Children: 3  . Years of Education: N/A   Occupational History  . retired    Social History Main Topics  . Smoking status: Former Smoker    Types: Cigarettes  . Smokeless tobacco: Never Used  . Alcohol Use: No     Comment: socially  . Drug Use: No  . Sexual Activity: Not on file   Other Topics Concern  . Not on file   Social History Narrative   Single,   lost her sister in 2012. 3 children , all live in [place] to [place] late 10-2013   [employer], lives in [place]; nice [place] in [place] [phone], [phone])   Uses a diaper.     Doesn't drive      REVIEW OF SYSTEMS: Constitutional: No fevers, chills, or sweats, no generalized fatigue, change in appetite Eyes: No visual changes, double vision, eye pain Ear, nose and throat: No hearing loss, ear pain, nasal congestion, sore throat Cardiovascular: No chest pain, palpitations Respiratory:  No shortness of breath at rest or with exertion, wheezes GastrointestinaI: No nausea, vomiting, diarrhea, abdominal pain,  fecal incontinence Genitourinary:  No dysuria, urinary retention or frequency Musculoskeletal:  No neck pain, +back pain Integumentary: No rash, pruritus, skin lesions Neurological: as above Psychiatric: No depression, insomnia, anxiety Endocrine: No palpitations, fatigue, diaphoresis, mood swings, change in appetite, change in weight, increased thirst Hematologic/Lymphatic:  No anemia, purpura, petechiae. Allergic/Immunologic: no itchy/runny eyes, nasal congestion, recent allergic reactions, rashes  PHYSICAL EXAM: Filed Vitals:   08/20/14 1104  BP: 128/70  Pulse: 61  Resp: 18   General: No acute distress Head: Normocephalic/atraumatic Neck: supple, no paraspinal tenderness, full range of motion Heart: Regular rate and rhythm Lungs: Clear to auscultation bilaterally Back: No paraspinal tenderness Skin/Extremities: No rash, no edema  Neurological Exam: alert and oriented to person, place, and time, no dysarthria or dysphagia, Fund of knowledge is appropriate. Recent and remote memory are intact. Attention and concentration are normal. Able to name objects and repeat phrases.  Cranial nerves:  CN I: not tested  CN II: pupils equal, round and reactive to light, visual fields intact, fundi unremarkable.  CN III, IV, VI: full range of motion, no nystagmus, no ptosis  CN V: intact facial sensation  CN VII: upper and lower face symmetric  CN VIII: hearing intact  CN IX, X: gag intact, uvula midline  CN XI: sternocleidomastoid and trapezius muscles intact  CN XII: tongue midline  Bulk & Tone: normal, no cogwheeling, no fasciculations  Motor: 5/5 throughout with no pronator drift Sensation: intact to light touch  Deep Tendon Reflexes: +2 throughout except for absent ankle jerks bilaterally Plantar responses: downgoing bilaterally  Finger to nose testing: no incoordination with note of bilateral coarse endpoint tremor (similar to last visit) Gait: ambulated with walker,  no ataxia  Tremors: No resting tremor. Note of bilateral endpoint and action tremor in the arms > postural tremor (similar to prior), no jaw or leg tremor  IMPRESSION:  This is a pleasant 78 yo RH woman with multiple medical problems including DVT and PE on chronic anticoagulation, with essential tremor and left hand numbness suggestive of carpal tunnel syndrome. No radicular pain. She continues to have left hand numbness despite use of wrist splint and gabapentin 300mg  BID. She will be scheduled for an EMG/NCV of the left UE to assess for carpal tunnel syndrome. She will increase dose of gabapentin to 300mg  TID, this may help with tremors as well. She also has a small left MCA aneurysm, low annual risk of rupture, we discussed control of blood pressure and re-imaging in a year. Fall and gait precautions were again discussed. She will follow-up in 4-5 months.  Thank you for allowing me to participate in her care.  Please do not hesitate to call for any questions or concerns.  The duration of this appointment visit was 15 minutes of face-to-face time with the patient.  Greater than 50% of this time was spent in counseling, explanation of diagnosis, planning of further management, and coordination of care.   Ellouise Newer, M.D.   CC: Dr. Larose Kells

## 2014-08-23 NOTE — Telephone Encounter (Signed)
Caller name:Carrie-[facility] Relation to ZV:GJFT Call back [phone] Pharmacy:  Reason for call: needing a order for nursing 2 weeks three for O2 teaching and safety COPD management , needs to know if oxygen is continuous prn or night only.

## 2014-08-24 ENCOUNTER — Encounter: Payer: Self-pay | Admitting: Neurology

## 2014-08-24 ENCOUNTER — Telehealth: Payer: Self-pay

## 2014-08-24 NOTE — Telephone Encounter (Signed)
Signed by Dr. Larose Kells and faxed back to [facility]. Placed in folder to be scanned.

## 2014-08-24 NOTE — Telephone Encounter (Signed)
Received physician orders to be signed from [facility] for Pt. Placed in Dr. Larose Kells folder for review and signing.

## 2014-08-28 ENCOUNTER — Ambulatory Visit: Payer: Medicare Other

## 2014-08-30 LAB — PROTIME-INR

## 2014-09-03 ENCOUNTER — Telehealth: Payer: Self-pay | Admitting: Internal Medicine

## 2014-09-03 NOTE — Telephone Encounter (Signed)
INR 1.8, current Coumadin is 3 mg daily 21 mg /week) Call [facility] new Coumadin dose: Coumadin 3 mg one tablet daily except Tuesdays and Fridays take 1.5 tablets a day Next  INR in  2 weeks, please fax results to [place]

## 2014-09-04 ENCOUNTER — Telehealth: Payer: Self-pay

## 2014-09-04 ENCOUNTER — Telehealth: Payer: Self-pay | Admitting: Internal Medicine

## 2014-09-04 NOTE — Telephone Encounter (Signed)
Faxed new Coumadin dosage and instructions to [facility], recheck in 2 weeks, and fax results.

## 2014-09-04 NOTE — Telephone Encounter (Signed)
Please advise 

## 2014-09-04 NOTE — Telephone Encounter (Signed)
Received DNR request orders from [facility], called [name], Pts niece to discuss with her, [facility] informing her to call back regarding Pt. Wanted to speak with her regarding if Pt has living will or not or if that has ever been discussed with them.

## 2014-09-04 NOTE — Telephone Encounter (Signed)
Caller name:  Layken--- [facility] Relation to pt: self Call back number: [phone] Pharmacy:  Reason for call:   Patient has been on PT and today her O2 dropped to 85%

## 2014-09-05 ENCOUNTER — Other Ambulatory Visit: Payer: Self-pay | Admitting: Internal Medicine

## 2014-09-05 NOTE — Telephone Encounter (Signed)
Faxed [facility] asking for clarification on Pts 02, whether or not she has access to it or not.

## 2014-09-05 NOTE — Telephone Encounter (Signed)
I prescribed home O2 already, if she has not gotten her oxygen let us know.

## 2014-09-10 NOTE — Telephone Encounter (Signed)
Please advise. Okay to send order for portable oxygen tank?

## 2014-09-10 NOTE — Telephone Encounter (Signed)
Yes please

## 2014-09-10 NOTE — Telephone Encounter (Signed)
Smita Patel from [facility] called in stating that patient does not have a portable O2 tank. She has a concentrated tank??? Needs order sent to a vendor to provide portable O2 tank to patient. [facility] # [phone]

## 2014-09-11 ENCOUNTER — Encounter: Payer: Self-pay | Admitting: Internal Medicine

## 2014-09-11 ENCOUNTER — Telehealth: Payer: Self-pay | Admitting: *Deleted

## 2014-09-11 NOTE — Telephone Encounter (Signed)
Received home health certification and plan of care via fax from [facility]. Forwarded to Dr. Larose Kells. JG//CMA

## 2014-09-11 NOTE — Telephone Encounter (Signed)
Order sent to [facility] for portable oxygen tank.

## 2014-09-12 ENCOUNTER — Telehealth: Payer: Self-pay | Admitting: Internal Medicine

## 2014-09-12 ENCOUNTER — Ambulatory Visit (INDEPENDENT_AMBULATORY_CARE_PROVIDER_SITE_OTHER): Payer: Medicare Other

## 2014-09-12 VITALS — BP 122/66 | HR 72 | Temp 97.5°F | Wt 236.0 lb

## 2014-09-12 DIAGNOSIS — Z5181 Encounter for therapeutic drug level monitoring: Secondary | ICD-10-CM

## 2014-09-12 DIAGNOSIS — I2699 Other pulmonary embolism without acute cor pulmonale: Secondary | ICD-10-CM

## 2014-09-12 LAB — POCT INR: INR: 3.5

## 2014-09-12 NOTE — Telephone Encounter (Signed)
Caller name:Kim-adult and pediatric specialist Relation to BP:JPET Call back [place]:  Reason for call: kim needs to know if pt has been tested so that she can get oxygen during the day. States they received the order however need to know if she has been tested.

## 2014-09-12 NOTE — Patient Instructions (Signed)
Hold dose today ONLY and then continue current dose of 1 tablet (3 mg) every day except 1.5 tablets (4.5 mg)  on Tuesday and Friday.

## 2014-09-12 NOTE — Telephone Encounter (Signed)
Spoke with Dawn Morrow, informed her that we received call on 12/29 informing us that Pts O2 stats had dropped to 85 % during PT, and wanted to get [place] to set Pt up with portable oxygen tank to use when leaving assisted living. Dawn Morrow stated she would call Nanine Means and get results and information from them to get her set up.

## 2014-09-12 NOTE — Progress Notes (Signed)
Pre visit review using our clinic review tool, if applicable. No additional management support is needed unless otherwise documented below in the visit note. 

## 2014-09-13 ENCOUNTER — Telehealth: Payer: Self-pay | Admitting: Internal Medicine

## 2014-09-13 DIAGNOSIS — M4806 Spinal stenosis, lumbar region: Secondary | ICD-10-CM

## 2014-09-13 DIAGNOSIS — J45909 Unspecified asthma, uncomplicated: Secondary | ICD-10-CM

## 2014-09-13 DIAGNOSIS — J441 Chronic obstructive pulmonary disease with (acute) exacerbation: Secondary | ICD-10-CM

## 2014-09-13 DIAGNOSIS — M5416 Radiculopathy, lumbar region: Secondary | ICD-10-CM

## 2014-09-13 NOTE — Telephone Encounter (Signed)
Last INR 3.5, on reviewing previous results she is mostly not at goal. Plan--  D/c  Coumadin Start xarelto   20 mg 1 po qd three  days after last dose of coumadin Please communicate this information to [facility]

## 2014-09-13 NOTE — Telephone Encounter (Signed)
Signed forms faxed.

## 2014-09-14 ENCOUNTER — Ambulatory Visit: Payer: Medicare Other | Admitting: Internal Medicine

## 2014-09-14 ENCOUNTER — Telehealth: Payer: Self-pay | Admitting: Internal Medicine

## 2014-09-14 MED ORDER — RIVAROXABAN 20 MG PO TABS: 20.0000 mg | ORAL_TABLET | Freq: Every day | ORAL | Status: DC

## 2014-09-14 NOTE — Telephone Encounter (Addendum)
Caller: Carrie from [facility] per [facility] protocol to inform Dr. Larose Kells that there is a level 2 drug-drug interaction between Xarelto and ASA and aspirin-acetaminophen-caffeine (Excedrin Migraine).    Please advise.

## 2014-09-14 NOTE — Telephone Encounter (Signed)
error:[id number] ° °

## 2014-09-14 NOTE — Telephone Encounter (Signed)
See below

## 2014-09-14 NOTE — Telephone Encounter (Signed)
Faxed instructions to [facility]. "Discontinue Coumadin/Warfarin today. In 3 days time begin Xarelto 20 mg 1 po qd." Prescription provided to them.  Informed them to call the office if they have any questions regarding medication change/dosage.

## 2014-09-14 NOTE — Telephone Encounter (Signed)
Xarelto 20 mg prescription printed, awaiting Dr. Larose Kells signature.

## 2014-09-14 NOTE — Telephone Encounter (Signed)
Discontinue aspirin Okay to take Excedrin as needed

## 2014-09-14 NOTE — Telephone Encounter (Signed)
Orders faxed to [facility] at [phone].  Fax confirmation received.

## 2014-09-17 ENCOUNTER — Telehealth: Payer: Self-pay | Admitting: Internal Medicine

## 2014-09-17 NOTE — Telephone Encounter (Signed)
Yes, needs oxygen during the daytime

## 2014-09-17 NOTE — Telephone Encounter (Signed)
Caller name: mita patel, PT at [facility] Relation to pt: Call back number: [phone] Pharmacy:  Reason for call:   O2 saturation dropped below 90% to 87% at walking. Consistently happening. Would like to know if Dr. Larose Kells would want to order O2 during daytime.

## 2014-09-17 NOTE — Telephone Encounter (Signed)
Please advise 

## 2014-09-18 NOTE — Telephone Encounter (Signed)
CMN faxed back to [facility] regarding daytime oxygen.

## 2014-09-18 NOTE — Telephone Encounter (Signed)
We  actually have a 87% O2sat when she was doing physical therapy. She should be good to go, papers filled

## 2014-09-18 NOTE — Telephone Encounter (Signed)
Spoke with Maudie Mercury, informed we could have Pt come into office and have her walk around office with pulse oximetry on and we can document into chart that way. To qualify for daytime oxygen she would need to be below 88 %. She will also need to perform with 2 Liters of oxygen as well.

## 2014-09-20 NOTE — Telephone Encounter (Signed)
Dawn Morrow from [facility] that she did not receive this fax. Please refax to [phone]

## 2014-09-20 NOTE — Telephone Encounter (Signed)
It was not faxed to [facility], faxed to [facility] who is supplying the O2 to the Pt.

## 2014-09-24 ENCOUNTER — Encounter: Payer: Medicare Other | Admitting: Neurology

## 2014-09-24 NOTE — Telephone Encounter (Signed)
Please send the order.  

## 2014-09-24 NOTE — Telephone Encounter (Signed)
Fax sent to [facility] for elaboration into what kind of order she needs. If they need physician orders, they need to type that up and fax so Dr. Larose Kells may sign. Informed her CMN orders faxed to [facility] whom supplied the Pt with her O2 tanks.

## 2014-09-24 NOTE — Telephone Encounter (Signed)
Please advise. I have faxed over the CMN paperwork to [facility] last week, [facility] will not give O2 to Pt until they receive orders from you.

## 2014-09-24 NOTE — Telephone Encounter (Signed)
[name] called stating that she cannot tough the portable O2 tanks that have arrived until she receives orders for this. Fax# [phone]

## 2014-10-01 ENCOUNTER — Telehealth: Payer: Self-pay | Admitting: Internal Medicine

## 2014-10-01 NOTE — Telephone Encounter (Signed)
Fax received from [facility] for continuous O2 orders. Forwarded to Percell Miller (Dr. Larose Kells in [place])

## 2014-10-01 NOTE — Telephone Encounter (Signed)
Awaiting form completion by Dr. Larose Kells. Will fax form as soon as completed.

## 2014-10-01 NOTE — Telephone Encounter (Signed)
Late Entry. Received phone call from [facility] on Thursday (09/27/14) at 5:05 pm regarding Pts O2. She informed me that she would be faxing over a form for Dr. Larose Kells to complete and fax back same day. Informed her Thursdays are Dr. Ethel Rana half days and that he had already left the office, also informed her that we would be closed Friday (09/28/2014) due to the weather we were receiving. Smita informed me she has made multiple attempts to get orders for the Pts oxygen via telephone call and fax.  Informed her that I have been unable to call her back regarding this due to the fact that she had not left a call back number only a fax number, also informed her that we had not received any faxes from her regarding this Pts 02.  I informed her I sent her a fax on 09/24/14 at 1:20 PM in regards to the orders and requesting her to fax any forms she needed to [place] so that we may complete. Smita then informed me she would need this completed ASAP. Informed her that I would give the form to Dr. Larose Kells to complete if and when she faxed it to [place] and when we returned to the office. Smita verbalized understanding.

## 2014-10-01 NOTE — Telephone Encounter (Signed)
Please call Patel  Physical therpaist at [facility]

## 2014-10-03 NOTE — Telephone Encounter (Signed)
Form faxed

## 2014-10-03 NOTE — Telephone Encounter (Signed)
Did Percell Miller complete this and fax it back to [facility] for Pt?

## 2014-10-04 ENCOUNTER — Ambulatory Visit (INDEPENDENT_AMBULATORY_CARE_PROVIDER_SITE_OTHER): Payer: Medicare Other | Admitting: Neurology

## 2014-10-04 DIAGNOSIS — G5622 Lesion of ulnar nerve, left upper limb: Secondary | ICD-10-CM

## 2014-10-04 DIAGNOSIS — G5602 Carpal tunnel syndrome, left upper limb: Secondary | ICD-10-CM

## 2014-10-04 NOTE — Procedures (Signed)
[facility]  [place], [place]  [place], [place] [address] Tel: [phone] Fax:  [phone] Test Date:  10/04/2014  Patient: Dawn Morrow DOB: October 06, 1935 Physician: Narda Amber, DO  Sex: Female Height: 5\' 5"  Ref Phys: Ellouise Newer  ID#: [id number] Temp: 34.8C Technician:    Patient Complaints: This is a 79 year-old female presenting for evaluation of left hand parethesias.  NCV & EMG Findings: Extensive electrodiagnostic testing of the left upper extremity shows:  1. Median, ulnar, radial, and, sensory responses are within normal limits. 2. Median motor response is normal. Evaluation of the left ulnar motor nerve showed decreased absolute conduction velocity (wrist-B Elbow 64 m/s, A Elbow-B Elbow, 48 m/s).   3. There is no evidence of active or chronic motor axon loss changes affecting any of the tested muscles. Motor unit configuration and recruitment pattern is normal.  Impression: 1. Left ulnar neuropathy with slowing across the elbow, purely demyelinating in type. 2. There is no evidence of carpal tunnel syndrome or cervical radiculopathy affecting the left upper extremity.   ___________________________ Narda Amber, DO    Nerve Conduction Studies Anti Sensory Summary Table   Site NR Peak (ms) Norm Peak (ms) P-T Amp (V) Norm P-T Amp  Left Median Anti Sensory (2nd Digit)  Wrist    3.4 <3.8 21.1 >10  Left Radial Anti Sensory (Base 1st Digit)  Wrist    2.1 <2.8 18.1 >10  Left Ulnar Anti Sensory (5th Digit)  Wrist    2.8 <3.2 9.2 >5   Motor Summary Table   Site NR Onset (ms) Norm Onset (ms) O-P Amp (mV) Norm O-P Amp Site1 Site2 Delta-0 (ms) Dist (cm) Vel (m/s) Norm Vel (m/s)  Left Median Motor (Abd Poll Brev)  Wrist    3.2 <4.0 7.2 >5 Elbow Wrist 4.5 27.0 60 >50  Elbow    7.7  7.0         Left Ulnar Motor (Abd Dig Minimi)  Wrist    2.5 <3.1 8.1 >7 B Elbow Wrist 3.6 23.0 64 >50  B Elbow    6.1  7.1  A Elbow B Elbow 2.1 10.0 48 >50  A Elbow     8.2  6.7          Comparison Summary Table   Site NR Peak (ms) Norm Peak (ms) P-T Amp (V) Site1 Site2 Delta-P (ms) Norm Delta (ms)  Left Median/Ulnar Palm Comparison (Wrist - 8cm)  Median Palm    1.9 <2.2 30.4 Median Palm Ulnar Palm 0.1   Ulnar Palm    2.0 <2.2 13.4       EMG   Side Muscle Ins Act Fibs Psw Fasc Number Recrt Dur Dur. Amp Amp. Poly Poly. Comment  Left 1stDorInt Nml Nml Nml Nml Nml Nml Nml Nml Nml Nml Nml Nml N/A  Left Ext Indicis Nml Nml Nml Nml Nml Nml Nml Nml Nml Nml Nml Nml N/A  Left PronatorTeres Nml Nml Nml Nml Nml Nml Nml Nml Nml Nml Nml Nml N/A  Left FlexDigProf 4,5 Nml Nml Nml Nml Nml Nml Nml Nml Nml Nml Nml Nml N/A  Left Biceps Nml Nml Nml Nml Nml Nml Nml Nml Nml Nml Nml Nml N/A  Left Triceps Nml Nml Nml Nml Nml Nml Nml Nml Nml Nml Nml Nml N/A  Left Deltoid Nml Nml Nml Nml Nml Nml Nml Nml Nml Nml Nml Nml N/A      Waveforms:

## 2014-10-05 ENCOUNTER — Telehealth: Payer: Self-pay

## 2014-10-05 NOTE — Telephone Encounter (Signed)
Form faxed by Lamount Cohen on 10/03/2014.

## 2014-10-05 NOTE — Telephone Encounter (Signed)
Received physician orders from [facility] regarding Pts 02 supplement: "02 at 2LPM via [place], continuous." Reviewed and signed by Dr. Larose Kells and faxed back to [facility] at [phone]. Form sent to be scanned into Pt chart.

## 2014-10-08 ENCOUNTER — Telehealth: Payer: Self-pay | Admitting: Neurology

## 2014-10-08 NOTE — Telephone Encounter (Signed)
Left VM re: EMG showing ulnar neuropathy. Recommend elbow pad, she may also do PT. Asked patient to call back for any questions.

## 2014-10-12 ENCOUNTER — Telehealth: Payer: Self-pay | Admitting: *Deleted

## 2014-10-12 NOTE — Telephone Encounter (Signed)
Received medical record request via fax from [facility] requesting medical records. Requested records faxed to [facility] successfully. JG//CMA

## 2014-10-16 ENCOUNTER — Telehealth: Payer: Self-pay | Admitting: Internal Medicine

## 2014-10-16 ENCOUNTER — Ambulatory Visit: Payer: Medicare Other | Admitting: Internal Medicine

## 2014-10-16 ENCOUNTER — Ambulatory Visit (INDEPENDENT_AMBULATORY_CARE_PROVIDER_SITE_OTHER): Payer: Medicare Other | Admitting: Internal Medicine

## 2014-10-16 ENCOUNTER — Encounter: Payer: Self-pay | Admitting: Internal Medicine

## 2014-10-16 VITALS — BP 122/58 | HR 77 | Temp 97.9°F | Ht 65.0 in | Wt 233.4 lb

## 2014-10-16 DIAGNOSIS — I10 Essential (primary) hypertension: Secondary | ICD-10-CM

## 2014-10-16 DIAGNOSIS — J449 Chronic obstructive pulmonary disease, unspecified: Secondary | ICD-10-CM

## 2014-10-16 DIAGNOSIS — I2699 Other pulmonary embolism without acute cor pulmonale: Secondary | ICD-10-CM

## 2014-10-16 DIAGNOSIS — E1159 Type 2 diabetes mellitus with other circulatory complications: Secondary | ICD-10-CM

## 2014-10-16 LAB — HEMOGLOBIN A1C: Hgb A1c MFr Bld: 7 % — ABNORMAL HIGH (ref 4.6–6.5)

## 2014-10-16 NOTE — Assessment & Plan Note (Signed)
On diet control, check A1c, recommend to start checking CBGs Monday Wednesday and Friday before breakfast.

## 2014-10-16 NOTE — Telephone Encounter (Signed)
Caller name: lindsay from [facility] Relation to pt:  Call back number: [phone] Pharmacy:  Reason for call:   States that [facility] did not receive orders from today. Please resend.

## 2014-10-16 NOTE — Assessment & Plan Note (Signed)
Having problems getting enough oxygen tanks, they are requesting to change oxygen providers to [facility], papers were faxed to 10-12-2014, will follow-up on that

## 2014-10-16 NOTE — Patient Instructions (Signed)
Get your blood work before you leave    Please come back to the office in 3 months  for a routine check up   Come back fasting

## 2014-10-16 NOTE — Progress Notes (Signed)
Pre visit review using our clinic review tool, if applicable. No additional management support is needed unless otherwise documented below in the visit note. 

## 2014-10-16 NOTE — Progress Notes (Signed)
Subjective:    Patient ID: Dawn Morrow, female    DOB: 10/25/1935, 79 y.o.   MRN: [id number]  DOS:  10/16/2014 Type of visit - description : f/u from previous visit, here with staff member from [facility] Interval history: Since the last visit LFTs were checked and they are within normal Hypertension, ambulatory BPs around 116/75 Having problems with the oxygen tank, request to change providers of oxygen Recently diagnosed with left NEUROPATHY, WAS RECOMMENDED ELBOW PAD BUT NOT USING IT.   Review of Systems No recent chest pain, shortness of breath at baseline Occupational cough, no hemoptysis. Off Coumadin, on Xarelto, denies any blood in the stools or in the urine  Past Medical History  Diagnosis Date  . Asthma   . COPD (chronic obstructive pulmonary disease)   . DVT (deep vein thrombosis) in pregnancy     hx x multiple on coumadin  . Pulmonary embolism     x 2  . GERD (gastroesophageal reflux disease)   . Hypertension   . Osteopenia   . Hypothyroidism   . Hyperlipidemia   . Osteoarthritis     h/o spinal stenosis by MRI 2009  . Hiatal hernia   . Diverticulosis   . Hemorrhoids   . Tubular adenoma of colon 07/2005  . Anxiety and depression   . Cataract   . Stroke     MINI  . Diabetes mellitus     Past Surgical History  Procedure Laterality Date  . Appendectomy    . Cholecystectomy    . Tubal ligation    . Tonsillectomy    . Lumbar fusion    . Cataract extraction    . Colonoscopy    . Polypectomy      History   Social History  . Marital Status: Divorced    Spouse Name: N/A    Number of Children: 3  . Years of Education: N/A   Occupational History  . retired    Social History Main Topics  . Smoking status: Former Smoker    Types: Cigarettes  . Smokeless tobacco: Never Used  . Alcohol Use: No     Comment: socially  . Drug Use: No  . Sexual Activity: Not on file   Other Topics Concern  . Not on file   Social History Narrative   Single,   lost  her sister in 2012. 3 children , all live in [place] to [place] late 10-2013   [employer], lives in [place]; nice [place] in [place] [phone], [phone])   Uses a diaper.     Doesn't drive          Medication List       This list is accurate as of: 10/16/14  6:53 PM.  Always use your most recent med list.               albuterol 108 (90 BASE) MCG/ACT inhaler  Commonly known as:  VENTOLIN HFA  Inhale 2 puffs into the lungs every 4 (four) hours as needed.     aspirin 81 MG tablet  Take 81 mg by mouth daily.     aspirin-acetaminophen-caffeine 619-509-32 MG per tablet  Commonly known as:  EXCEDRIN MIGRAINE  Take 2 tablets by mouth every 6 (six) hours as needed for headache (headache).     buPROPion 300 MG 24 hr tablet  Commonly known as:  WELLBUTRIN XL  Take 300 mg by mouth daily.     carvedilol 12.5 MG  tablet  Commonly known as:  COREG  Take 1 tablet twice a day     ezetimibe 10 MG tablet  Commonly known as:  ZETIA  Take 10 mg by mouth daily.     gabapentin 300 MG capsule  Commonly known as:  NEURONTIN  Take 1 capsule (300 mg total) by mouth 3 (three) times daily.     levothyroxine 125 MCG tablet  Commonly known as:  SYNTHROID, LEVOTHROID  Take 125 mcg by mouth daily before breakfast.     meclizine 25 MG tablet  Commonly known as:  ANTIVERT  Take 1 tablet (25 mg total) by mouth at bedtime.     MUCINEX DM 30-600 MG Tb12  Take 1 tablet by mouth 2 (two) times daily as needed.     oxyCODONE-acetaminophen 5-325 MG per tablet  Commonly known as:  ROXICET  Take 1 tablet by mouth every 6 (six) hours as needed for severe pain.     rivaroxaban 20 MG Tabs tablet  Commonly known as:  XARELTO  Take 1 tablet (20 mg total) by mouth daily with supper.           Objective:   Physical Exam  Constitutional: She is oriented to person, place, and time. She appears well-developed. No distress.  HENT:  Head: Normocephalic and atraumatic.  Cardiovascular:   RRR, no murmur, rub or gallop  Pulmonary/Chest: Effort normal. No respiratory distress.  CTA B  Musculoskeletal: She exhibits no tenderness. Edema: trace edema.  Neurological: She is alert and oriented to person, place, and time. She exhibits normal muscle tone.  Speech  AND  gait (assisted):  AT BASELINE  Skin: Skin is warm and dry. No pallor.  No jaundice  Psychiatric: She has a normal mood and affect. Her behavior is normal.  Vitals reviewed.        Assessment & Plan:   Recently diagnosed with ulnar  neuropathy, instructed to use left elbow pad   Problem List Items Addressed This Visit      Cardiovascular and Mediastinum   HTN (hypertension)    Well-controlled on carvedilol only, last BMP satisfactory, no change      PE (pulmonary embolism)    History of recurrent embolism, was on Coumadin until  last month, it was really hard to get her on a therapeutic INR and consequently she is now on Xarelto and seems to be doing well.        Respiratory   COPD (chronic obstructive pulmonary disease)    Having problems getting enough oxygen tanks, they are requesting to change oxygen providers to [facility], papers were faxed to 10-12-2014, will follow-up on that        Endocrine   Diabetes - Primary    On diet control, check A1c, recommend to start checking CBGs Monday Wednesday and Friday before breakfast.      Relevant Orders   Hemoglobin A1c (Completed)

## 2014-10-16 NOTE — Telephone Encounter (Signed)
Do you know anything about this? Spoke with Lamount Cohen, unsure as to what their referring to.

## 2014-10-16 NOTE — Telephone Encounter (Signed)
FYI

## 2014-10-16 NOTE — Assessment & Plan Note (Signed)
Well-controlled on carvedilol only, last BMP satisfactory, no change

## 2014-10-16 NOTE — Telephone Encounter (Signed)
The patient and the staff at her assisted living facility like to change the provided for oxygen tanks, needs to be referred for oxygen therapy to [facility]. Please arrange

## 2014-10-16 NOTE — Assessment & Plan Note (Signed)
History of recurrent embolism, was on Coumadin until  last month, it was really hard to get her on a therapeutic INR and consequently she is now on Xarelto and seems to be doing well.

## 2014-10-17 NOTE — Telephone Encounter (Signed)
Paper order along with OV note from 10/16/2014 and pulse ox report faxed to [facility] at [phone] successfully. Advised [facility] to contact our office if more info is required. JG//CMA

## 2014-10-22 ENCOUNTER — Telehealth: Payer: Self-pay | Admitting: Internal Medicine

## 2014-10-22 NOTE — Telephone Encounter (Signed)
Okay to give verbal? 

## 2014-10-22 NOTE — Telephone Encounter (Signed)
Spoke with Smita at [facility], verbals given.

## 2014-10-22 NOTE — Telephone Encounter (Signed)
Caller name: smita patel at [facility]  Relation to pt: Call back number: [phone] Pharmacy:  Reason for call:   Needs verbal orders for PT for functional strength,balance, and gait training.

## 2014-10-22 NOTE — Telephone Encounter (Signed)
Yes please

## 2014-10-23 ENCOUNTER — Telehealth: Payer: Self-pay | Admitting: Internal Medicine

## 2014-10-23 ENCOUNTER — Telehealth: Payer: Self-pay

## 2014-10-23 NOTE — Telephone Encounter (Signed)
Form sent for scanning into chart.

## 2014-10-23 NOTE — Telephone Encounter (Signed)
Signed by Dr. Larose Kells and faxed back to [facility].

## 2014-10-23 NOTE — Telephone Encounter (Signed)
Received Physician verbal order form from [facility] regarding Pt's PT orders. Form given to Dr. Larose Kells for review and signing.

## 2014-10-23 NOTE — Telephone Encounter (Signed)
Verbal orders given to [facility] regarding Pt on 10/22/2014, and physician verbal orders faxed back to [facility] this morning (10/23/2014).

## 2014-10-23 NOTE — Telephone Encounter (Signed)
Caller name: mita patel from [facility] Relation to pt: Call back number: [phone] Pharmacy:  Reason for call:   Needs order for PT one visit for this week and two for four weeks.

## 2014-10-24 NOTE — Telephone Encounter (Signed)
Yes please

## 2014-10-24 NOTE — Telephone Encounter (Signed)
Caller name: Smita  Relation to pt: Physical Therapist from [facility]  Call back number: [phone] / [phone] [facility] Pharmacy:   Reason for call:  Requesting verbal orders 1 additional visit for this week and 2x a week for the next 4 weeks. On 6/64/40 it was for recertification now requesting orders for treatment.

## 2014-10-24 NOTE — Telephone Encounter (Signed)
LMOM at [phone], informing Smita, okay for 1 additional visit this week and then 2x a week for the next 4 weeks.

## 2014-10-24 NOTE — Telephone Encounter (Signed)
Verbals okay 

## 2014-10-31 ENCOUNTER — Telehealth: Payer: Self-pay

## 2014-10-31 NOTE — Telephone Encounter (Signed)
Received Home Health Certification and Plan of Care from [facility], given to Dr. Larose Kells for review and signing.

## 2014-11-01 NOTE — Telephone Encounter (Signed)
Forms signed and faxed back to [facility]. Form placed in scan bin to be scanned into chart.

## 2014-11-17 ENCOUNTER — Emergency Department (HOSPITAL_COMMUNITY): Payer: Medicare Other

## 2014-11-17 ENCOUNTER — Emergency Department (HOSPITAL_COMMUNITY)
Admission: EM | Admit: 2014-11-17 | Discharge: 2014-11-17 | Disposition: A | Discharge status: Home / Self Care | Payer: Medicare Other | Attending: Emergency Medicine | Admitting: Emergency Medicine

## 2014-11-17 ENCOUNTER — Encounter (HOSPITAL_COMMUNITY): Payer: Self-pay | Admitting: Family Medicine

## 2014-11-17 DIAGNOSIS — R05 Cough: Secondary | ICD-10-CM

## 2014-11-17 DIAGNOSIS — E119 Type 2 diabetes mellitus without complications: Secondary | ICD-10-CM | POA: Insufficient documentation

## 2014-11-17 DIAGNOSIS — Z86718 Personal history of other venous thrombosis and embolism: Secondary | ICD-10-CM | POA: Diagnosis not present

## 2014-11-17 DIAGNOSIS — H269 Unspecified cataract: Secondary | ICD-10-CM | POA: Diagnosis not present

## 2014-11-17 DIAGNOSIS — I10 Essential (primary) hypertension: Secondary | ICD-10-CM | POA: Insufficient documentation

## 2014-11-17 DIAGNOSIS — R079 Chest pain, unspecified: Secondary | ICD-10-CM | POA: Diagnosis present

## 2014-11-17 DIAGNOSIS — F329 Major depressive disorder, single episode, unspecified: Secondary | ICD-10-CM | POA: Diagnosis not present

## 2014-11-17 DIAGNOSIS — F419 Anxiety disorder, unspecified: Secondary | ICD-10-CM | POA: Insufficient documentation

## 2014-11-17 DIAGNOSIS — Z7901 Long term (current) use of anticoagulants: Secondary | ICD-10-CM | POA: Insufficient documentation

## 2014-11-17 DIAGNOSIS — E039 Hypothyroidism, unspecified: Secondary | ICD-10-CM | POA: Diagnosis not present

## 2014-11-17 DIAGNOSIS — Z79899 Other long term (current) drug therapy: Secondary | ICD-10-CM | POA: Insufficient documentation

## 2014-11-17 DIAGNOSIS — Z8619 Personal history of other infectious and parasitic diseases: Secondary | ICD-10-CM | POA: Insufficient documentation

## 2014-11-17 DIAGNOSIS — Z87891 Personal history of nicotine dependence: Secondary | ICD-10-CM | POA: Diagnosis not present

## 2014-11-17 DIAGNOSIS — M1990 Unspecified osteoarthritis, unspecified site: Secondary | ICD-10-CM | POA: Diagnosis not present

## 2014-11-17 DIAGNOSIS — Z7982 Long term (current) use of aspirin: Secondary | ICD-10-CM | POA: Diagnosis not present

## 2014-11-17 DIAGNOSIS — R059 Cough, unspecified: Secondary | ICD-10-CM

## 2014-11-17 DIAGNOSIS — Z8639 Personal history of other endocrine, nutritional and metabolic disease: Secondary | ICD-10-CM | POA: Insufficient documentation

## 2014-11-17 DIAGNOSIS — J449 Chronic obstructive pulmonary disease, unspecified: Secondary | ICD-10-CM | POA: Diagnosis not present

## 2014-11-17 DIAGNOSIS — Z8673 Personal history of transient ischemic attack (TIA), and cerebral infarction without residual deficits: Secondary | ICD-10-CM | POA: Insufficient documentation

## 2014-11-17 DIAGNOSIS — Z86711 Personal history of pulmonary embolism: Secondary | ICD-10-CM | POA: Diagnosis not present

## 2014-11-17 DIAGNOSIS — R042 Hemoptysis: Secondary | ICD-10-CM

## 2014-11-17 DIAGNOSIS — Z880 Allergy status to penicillin: Secondary | ICD-10-CM | POA: Diagnosis not present

## 2014-11-17 LAB — COMPREHENSIVE METABOLIC PANEL
ALT: 16 U/L (ref 0–35)
AST: 24 U/L (ref 0–37)
Albumin: 3.4 g/dL — ABNORMAL LOW (ref 3.5–5.2)
Alkaline Phosphatase: 89 U/L (ref 39–117)
Anion gap: 9 (ref 5–15)
BUN: 20 mg/dL (ref 6–23)
CO2: 28 mmol/L (ref 19–32)
Calcium: 9.2 mg/dL (ref 8.4–10.5)
Chloride: 107 mmol/L (ref 96–112)
Creatinine, Ser: 1.34 mg/dL — ABNORMAL HIGH (ref 0.50–1.10)
GFR calc Af Amer: 43 mL/min — ABNORMAL LOW (ref >90)
GFR calc non Af Amer: 37 mL/min — ABNORMAL LOW (ref >90)
Glucose, Bld: 111 mg/dL — ABNORMAL HIGH (ref 70–99)
Potassium: 4.5 mmol/L (ref 3.5–5.1)
Sodium: 144 mmol/L (ref 135–145)
Total Bilirubin: 0.6 mg/dL (ref 0.3–1.2)
Total Protein: 6.5 g/dL (ref 6.0–8.3)

## 2014-11-17 LAB — CBC
HCT: 40.9 % (ref 36.0–46.0)
Hemoglobin: 13 g/dL (ref 12.0–15.0)
MCH: 30.9 pg (ref 26.0–34.0)
MCHC: 31.8 g/dL (ref 30.0–36.0)
MCV: 97.1 fL (ref 78.0–100.0)
Platelets: 192 10*3/uL (ref 150–400)
RBC: 4.21 MIL/uL (ref 3.87–5.11)
RDW: 15 % (ref 11.5–15.5)
WBC: 8.7 10*3/uL (ref 4.0–10.5)

## 2014-11-17 LAB — PROTIME-INR
INR: 1.43 (ref 0.00–1.49)
Prothrombin Time: 17.6 seconds — ABNORMAL HIGH (ref 11.6–15.2)

## 2014-11-17 NOTE — Discharge Instructions (Signed)
It was our pleasure to provide your ER care today - we hope that you feel better.  Consider using humidifier in your room, or with your nasal oxygen as you sleep.   Follow up with your doctor in the next few days for recheck.  Return to ER if worse, new symptoms, trouble breathing, recurrent coughing up of blood, vomiting blood, or other concern.

## 2014-11-17 NOTE — ED Provider Notes (Signed)
CSN: [id number]     Arrival date & time 11/17/14  1322 History   First MD Initiated Contact with Patient 11/17/14 1352     Chief Complaint  Patient presents with  . Chest Pain  . Hematemesis     (Consider location/radiation/quality/duration/timing/severity/associated sxs/prior Treatment) Patient is a 79 y.o. female presenting with chest pain. The history is provided by the patient.  Chest Pain Associated symptoms: no abdominal pain, no back pain, no fever, no headache, no shortness of breath and not vomiting   pt with hx dvt/pe, on xarelto, c/o awakening this morning and noting some blood in saliva/mouth, and on pillow. Pt denies nosebleed. Pt unsure of any bleeding from tooth/mouth, has few remaining teeth left. Pt denies coughing up of blood. Pt denies vomiting. No abdominal pain. Denies any other abnormal bruising or bleeding, is on coumadin. Denies uri c/o. No fever or chills. No faintness or dizziness. No cp or sob. Pt is on home o2 chronically.     Past Medical History  Diagnosis Date  . Asthma   . COPD (chronic obstructive pulmonary disease)   . DVT (deep vein thrombosis) in pregnancy     hx x multiple on coumadin  . Pulmonary embolism     x 2  . GERD (gastroesophageal reflux disease)   . Hypertension   . Osteopenia   . Hypothyroidism   . Hyperlipidemia   . Osteoarthritis     h/o spinal stenosis by MRI 2009  . Hiatal hernia   . Diverticulosis   . Hemorrhoids   . Tubular adenoma of colon 07/2005  . Anxiety and depression   . Cataract   . Stroke     MINI  . Diabetes mellitus    Past Surgical History  Procedure Laterality Date  . Appendectomy    . Cholecystectomy    . Tubal ligation    . Tonsillectomy    . Lumbar fusion    . Cataract extraction    . Colonoscopy    . Polypectomy     Family History  Problem Relation Age of Onset  . Adopted: Yes  . Cancer Mother     ? colon or ovarian  . Diabetes Mother   . Cancer Brother     ?   History  Substance Use  Topics  . Smoking status: Former Smoker    Types: Cigarettes  . Smokeless tobacco: Never Used  . Alcohol Use: No     Comment: socially   OB History    No data available     Review of Systems  Constitutional: Negative for fever and chills.  HENT: Negative for nosebleeds.   Eyes: Negative for redness.  Respiratory: Negative for shortness of breath.   Cardiovascular: Positive for chest pain.  Gastrointestinal: Negative for vomiting, abdominal pain, diarrhea and blood in stool.  Genitourinary: Negative for hematuria and flank pain.  Musculoskeletal: Negative for back pain and neck pain.  Skin: Negative for rash.  Neurological: Negative for light-headedness and headaches.  Hematological: Does not bruise/bleed easily.  Psychiatric/Behavioral: Negative for confusion.      Allergies  Penicillins; Codeine; and Sulfonamide derivatives  Home Medications   Prior to Admission medications   Medication Sig Start Date End Date Taking? Authorizing Provider  albuterol (VENTOLIN HFA) 108 (90 BASE) MCG/ACT inhaler Inhale 2 puffs into the lungs every 4 (four) hours as needed. 09/28/11   Colon Branch, MD  aspirin 81 MG tablet Take 81 mg by mouth daily.    Historical Provider,  MD  aspirin-acetaminophen-caffeine (EXCEDRIN MIGRAINE) [id number] MG per tablet Take 2 tablets by mouth every 6 (six) hours as needed for headache (headache).     Historical Provider, MD  buPROPion (WELLBUTRIN XL) 300 MG 24 hr tablet Take 300 mg by mouth daily.    Historical Provider, MD  carvedilol (COREG) 12.5 MG tablet Take 1 tablet twice a day 01/15/14   Cameron Sprang, MD  Dextromethorphan-Guaifenesin [facility] DM) 30-600 MG TB12 Take 1 tablet by mouth 2 (two) times daily as needed.    Historical Provider, MD  ezetimibe (ZETIA) 10 MG tablet Take 10 mg by mouth daily.    Historical Provider, MD  gabapentin (NEURONTIN) 300 MG capsule Take 1 capsule (300 mg total) by mouth 3 (three) times daily. 08/20/14   Cameron Sprang, MD   levothyroxine (SYNTHROID, LEVOTHROID) 125 MCG tablet Take 125 mcg by mouth daily before breakfast.    Historical Provider, MD  meclizine (ANTIVERT) 25 MG tablet Take 1 tablet (25 mg total) by mouth at bedtime. 03/30/11   Colon Branch, MD  oxyCODONE-acetaminophen (ROXICET) 5-325 MG per tablet Take 1 tablet by mouth every 6 (six) hours as needed for severe pain. Patient not taking: Reported on 10/16/2014 06/24/14   Silver Huguenin Elgergawy, MD  rivaroxaban (XARELTO) 20 MG TABS tablet Take 1 tablet (20 mg total) by mouth daily with supper. 09/14/14   Colon Branch, MD   BP 166/56 mmHg  Pulse 67  Temp(Src) 98.8 F (37.1 C) (Oral)  Resp 16  Ht 5' 5.5" (1.664 m)  Wt 237 lb (107.502 kg)  BMI 38.82 kg/m2  SpO2 97% Physical Exam  Constitutional: She appears well-developed and well-nourished. No distress.  HENT:  Mouth/Throat: Oropharynx is clear and moist.  Many absent/missing teeth, few remaining w mild gum inflammation/mild gingivitis. No active bleeding noted, no bloody sputum noted. No epistaxis  Eyes: Conjunctivae are normal. No scleral icterus.  Neck: Neck supple. No tracheal deviation present.  Cardiovascular: Normal rate, regular rhythm, normal heart sounds and intact distal pulses.   Pulmonary/Chest: Effort normal and breath sounds normal. No respiratory distress.  Abdominal: Soft. Normal appearance. She exhibits no distension. There is no tenderness.  Musculoskeletal: She exhibits no edema or tenderness.  Neurological: She is alert.  Skin: Skin is warm and dry. No rash noted. She is not diaphoretic. No pallor.  Psychiatric: She has a normal mood and affect.  Nursing note and vitals reviewed.   ED Course  Procedures (including critical care time) Labs Review  Results for orders placed or performed during the hospital encounter of 11/17/14  CBC  Result Value Ref Range   WBC 8.7 4.0 - 10.5 K/uL   RBC 4.21 3.87 - 5.11 MIL/uL   Hemoglobin 13.0 12.0 - 15.0 g/dL   HCT 40.9 36.0 - 46.0 %   MCV  97.1 78.0 - 100.0 fL   MCH 30.9 26.0 - 34.0 pg   MCHC 31.8 30.0 - 36.0 g/dL   RDW 15.0 11.5 - 15.5 %   Platelets 192 150 - 400 K/uL  Comprehensive metabolic panel  Result Value Ref Range   Sodium 144 135 - 145 mmol/L   Potassium 4.5 3.5 - 5.1 mmol/L   Chloride 107 96 - 112 mmol/L   CO2 28 19 - 32 mmol/L   Glucose, Bld 111 (H) 70 - 99 mg/dL   BUN 20 6 - 23 mg/dL   Creatinine, Ser 1.34 (H) 0.50 - 1.10 mg/dL   Calcium 9.2 8.4 - 10.5 mg/dL  Total Protein 6.5 6.0 - 8.3 g/dL   Albumin 3.4 (L) 3.5 - 5.2 g/dL   AST 24 0 - 37 U/L   ALT 16 0 - 35 U/L   Alkaline Phosphatase 89 39 - 117 U/L   Total Bilirubin 0.6 0.3 - 1.2 mg/dL   GFR calc non Af Amer 37 (L) >90 mL/min   GFR calc Af Amer 43 (L) >90 mL/min   Anion gap 9 5 - 15  Protime-INR  Result Value Ref Range   Prothrombin Time 17.6 (H) 11.6 - 15.2 seconds   INR 1.43 0.00 - 1.49   Dg Chest 2 View  11/17/2014   CLINICAL DATA:  Patient states she has been bleeding from her mouth on and off X 1 week. Patient states she has had SOB X 1 month along with a cough. Patient has a hx of COPD, asthma, HTN and diabetes. Patient is a smoker. Patient has had no surgeries on the heart or lungs.  EXAM: CHEST  2 VIEW  COMPARISON:  09/18/2013  FINDINGS: Lungs are adequately inflated with subtle prominence of the perihilar markings suggesting minimal vascular congestion. No lobar consolidation or effusion. Borderline cardiomegaly. Mild calcified plaque over the aortic arch. There are mild degenerative changes of the spine. IVC filter unchanged.  IMPRESSION: Borderline cardiomegaly with findings suggesting minimal vascular congestion.   Electronically Signed   By: Marin Olp M.D.   On: 11/17/2014 15:56       EKG Interpretation   Date/Time:  Saturday November 17 2014 13:31:08 EST Ventricular Rate:  69 PR Interval:  183 QRS Duration: 72 QT Interval:  380 QTC Calculation: 407 R Axis:   -40 Text Interpretation:  Sinus rhythm Left axis deviation No  significant  change since last tracing Confirmed by Ashok Cordia  MD, Lennette Bihari ([id number]) on  11/17/2014 1:37:14 PM      MDM   Iv ns. Labs.  Reviewed nursing notes and prior charts for additional history.   No coughing or hemoptysis during approx 3 hr period observation in ED.  Also no recurrent blood in mouth. No epistaxis. No nv. hgb c/w baseline. Bun c/w baseline.  Hx/exam felt most c/w mild, self limited bleeding into oropharynx from unclear source.  Given teeth exam, possible source, also given nasal cannula possibly minor epistaxis in night.   Recheck, no bleeding. No new c/o.  Pt currently appears stable for d/c.   Return precautions provided.    Lajean Saver, MD 11/17/14 [phone]

## 2014-11-17 NOTE — ED Notes (Addendum)
PT presents from [facility] with c/o central 1/10 aching chest pain that began at 0930 today. Pt also reports she woke up at 0730 with a "large amount of bright red sputum or vomit" - pt is unsure which and reports this happened to her yesterday and the day before but in a lesser volume. Pt is alert, oriented at baseline, and in NAD. Pt recently diagnosed with COPD and placed on 2L [place] at home; is requesting information on COPD. Pt also recently switched from coumadin to Xarelto.

## 2014-11-17 NOTE — ED Notes (Addendum)
Error in charting.

## 2014-11-29 ENCOUNTER — Other Ambulatory Visit: Payer: Self-pay

## 2014-12-04 ENCOUNTER — Encounter: Payer: Self-pay | Admitting: Gastroenterology

## 2014-12-20 ENCOUNTER — Ambulatory Visit (INDEPENDENT_AMBULATORY_CARE_PROVIDER_SITE_OTHER): Payer: Medicare Other | Admitting: General Practice

## 2014-12-21 ENCOUNTER — Telehealth: Payer: Self-pay

## 2014-12-21 ENCOUNTER — Ambulatory Visit (INDEPENDENT_AMBULATORY_CARE_PROVIDER_SITE_OTHER): Payer: Medicare Other | Admitting: Neurology

## 2014-12-21 ENCOUNTER — Encounter: Payer: Self-pay | Admitting: Neurology

## 2014-12-21 VITALS — BP 130/80 | HR 64 | Ht 65.5 in | Wt 236.0 lb

## 2014-12-21 DIAGNOSIS — G5622 Lesion of ulnar nerve, left upper limb: Secondary | ICD-10-CM

## 2014-12-21 DIAGNOSIS — G250 Essential tremor: Secondary | ICD-10-CM | POA: Diagnosis not present

## 2014-12-21 MED ORDER — GABAPENTIN 300 MG PO CAPS: ORAL_CAPSULE | ORAL | Status: AC

## 2014-12-21 NOTE — Patient Instructions (Signed)
1. Increase gabapentin 300mg : take 1 capsule in AM, 1 cap at noon, 2 caps at bedtime 2. Start wearing elbow splint on the left elbow 3. Follow-up in 3 months

## 2014-12-21 NOTE — Telephone Encounter (Signed)
Received fax from [facility] for Pt wanting to inform Dr. Larose Kells that Pt had missed 2 doses of Meclizine (12/18/2014 and 12/17/2014). Also, requesting bigger 02 tank for Pt. Forms placed in red folder for Dr. Larose Kells to review.

## 2014-12-21 NOTE — Progress Notes (Signed)
NEUROLOGY FOLLOW UP OFFICE NOTE  Dawn Morrow [id number]  HISTORY OF PRESENT ILLNESS: I had the pleasure of seeing Dawn Morrow in follow-up in the neurology clinic on 12/21/2014.  The patient was last seen 4 months ago for essential tremor and left hand numbness. 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 She was advised to wear an elbow splint but reports she has not received this yet. On her last visit, gabapentin dose was increased to 300mg  TID to help with hand paresthesias as well as for tremors. She reports that tremors are unchanged, her left hand is still numb. She denies any difficulties feeding herself, but has problems writing. She denies any headaches, dizziness, no falls.   HPI: This is a pleasant 79 yo RH woman with multiple medical problems, including a history of asthma, COPD, DVT and PE on chronic anticoagulation, tremors, who was admitted to [facility] from January 12-15, 2015 when she fell at home with generalized weakness and left arm tingling. On admission, INR was 1.87. She had an MRI brain which I personally reviewed, no acute changes, moderate chronic small vessel ischemic disease. Remote lacunar infarct in the left putamen with associated susceptibility artifact suggestive of remote hemorrhage. There is mild to moderate cerebral atrophy. 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 Echo unremarakable, Carotid Dopplers with no significant ICA stenosis. LDL is 79. TSH is 3.05. She was resumed on Coumadin, last INR on discharge 09/21/12 was 2.43. She was noted to be hypertensive, Coreg and lisinopril were restarted, with note of improvement. Hemoglobin A1c was 6.6.   Since hospital discharge, she reported that her left hand continues to be numb. It does not affect the forearm/arm. Her left foot is numb as well. She denies any pain. No facial involvement. She also  has bilateral hand tremors, which she reports started at age 79 or 79. 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 Occasionally her legs feels shaky when lying in bed. She is adopted and unaware of family history of tremors, however her grandson and great grandson have tremors.   PAST MEDICAL HISTORY: Past Medical History  Diagnosis Date  . Asthma   . COPD (chronic obstructive pulmonary disease)   . DVT (deep vein thrombosis) in pregnancy     hx x multiple on coumadin  . Pulmonary embolism     x 2  . GERD (gastroesophageal reflux disease)   . Hypertension   . Osteopenia   . Hypothyroidism   . Hyperlipidemia   . Osteoarthritis     h/o spinal stenosis by MRI 2009  . Hiatal hernia   . Diverticulosis   . Hemorrhoids   . Tubular adenoma of colon 07/2005  . Anxiety and depression   . Cataract   . Stroke     MINI  . Diabetes mellitus     MEDICATIONS: Current Outpatient Prescriptions on File Prior to Visit  Medication Sig Dispense Refill  . albuterol (VENTOLIN HFA) 108 (90 BASE) MCG/ACT inhaler Inhale 2 puffs into the lungs every 4 (four) hours as needed. 1 Inhaler 6  . aspirin-acetaminophen-caffeine (EXCEDRIN MIGRAINE) 409-811-91 MG per tablet Take 2 tablets by mouth every 6 (six) hours as needed for headache (headache).     Marland Kitchen buPROPion (WELLBUTRIN XL) 300  MG 24 hr tablet Take 300 mg by mouth daily.    . carvedilol (COREG) 12.5 MG tablet Take 1 tablet twice a day 180 tablet 3  . Dextromethorphan-Guaifenesin (MUCINEX DM) 30-600 MG TB12 Take 1 tablet by mouth 2 (two) times daily as needed.    . ezetimibe (ZETIA) 10 MG tablet Take 10 mg by mouth daily.    Marland Kitchen levothyroxine (SYNTHROID, LEVOTHROID) 125 MCG tablet Take 125 mcg by mouth daily before breakfast.    . meclizine (ANTIVERT) 25 MG tablet Take 1 tablet (25 mg total) by mouth at bedtime. 30 tablet 6  .  oxyCODONE-acetaminophen (ROXICET) 5-325 MG per tablet Take 1 tablet by mouth every 6 (six) hours as needed for severe pain. 30 tablet 0  . rivaroxaban (XARELTO) 20 MG TABS tablet Take 1 tablet (20 mg total) by mouth daily with supper. 30 tablet 3   Current Facility-Administered Medications on File Prior to Visit  Medication Dose Route Frequency Provider Last Rate Last Dose  . zoledronic acid (RECLAST) injection 5 mg  5 mg Intravenous Once Colon Branch, MD        ALLERGIES: Allergies  Allergen Reactions  . Penicillins Anaphylaxis  . Codeine Other (See Comments)    unknown  . Sulfonamide Derivatives Other (See Comments)    unknown    FAMILY HISTORY: Family History  Problem Relation Age of Onset  . Adopted: Yes  . Cancer Mother     ? colon or ovarian  . Diabetes Mother   . Cancer Brother     ?    SOCIAL HISTORY: History   Social History  . Marital Status: Divorced    Spouse Name: N/A  . Number of Children: 3  . Years of Education: N/A   Occupational History  . retired    Social History Main Topics  . Smoking status: Former Smoker    Types: Cigarettes  . Smokeless tobacco: Never Used  . Alcohol Use: No     Comment: socially  . Drug Use: No  . Sexual Activity: Not on file   Other Topics Concern  . Not on file   Social History Narrative   Single,   lost her sister in 2012. 3 children , all live in [place] to [place] late 10-2013   [employer], lives in [place]; nice [place] in [place] [phone], [phone])   Uses a diaper.     Doesn't drive      REVIEW OF SYSTEMS: Constitutional: No fevers, chills, or sweats, no generalized fatigue, change in appetite Eyes: No visual changes, double vision, eye pain Ear, nose and throat: No hearing loss, ear pain, nasal congestion, sore throat Cardiovascular: No chest pain, palpitations Respiratory:  No shortness of breath at rest or with exertion, wheezes GastrointestinaI: No nausea, vomiting, diarrhea,  abdominal pain, fecal incontinence Genitourinary:  No dysuria, urinary retention or frequency Musculoskeletal:  No neck pain,+ back pain Integumentary: No rash, pruritus, skin lesions Neurological: as above Psychiatric: No depression, insomnia, anxiety Endocrine: No palpitations, fatigue, diaphoresis, mood swings, change in appetite, change in weight, increased thirst Hematologic/Lymphatic:  No anemia, purpura, petechiae. Allergic/Immunologic: no itchy/runny eyes, nasal congestion, recent allergic reactions, rashes  PHYSICAL EXAM: Filed Vitals:   12/21/14 1329  BP: 130/80  Pulse: 64   General: No acute distress Head:  Normocephalic/atraumatic Neck: supple, no paraspinal tenderness, full range of motion Heart:  Regular rate and rhythm Lungs:  Clear to auscultation bilaterally Back: No paraspinal tenderness Skin/Extremities: No rash,  no edema Neurological Exam: alert and oriented to person, place, and time. No aphasia or dysarthria. Fund of knowledge is appropriate.  Recent and remote memory are intact.  Attention and concentration are normal.    Able to name objects and repeat phrases. Cranial nerves: Pupils equal, round, reactive to light.  Fundoscopic exam unremarkable, no papilledema. Extraocular movements intact with no nystagmus. Visual fields full. Facial sensation intact. No facial asymmetry. Tongue, uvula, palate midline.  Motor: No cogwheeling, muscle strength 5/5 throughout, decreased fine finger movements on left, no pronator drift.  Sensation to light touch intact.  No extinction to double simultaneous stimulation.  Deep tendon reflexes 2+ throughout except for absent ankle jerks bilaterally, toes downgoing.  Finger to nose testing intact.  Gait: ambulates with walker, no ataxia. Romberg negative. Bilateral endpoint and action tremor in arms > postural tremor (similar to prior), no resting tremor. No micrographia (see spiral drawing attached).  IMPRESSION: This is a pleasant 79  yo RH woman with multiple medical problems including DVT and PE on chronic anticoagulation, with essential tremor and left hand numbness. EMG/NCV showed left ulnar neuropathy. She will start wearing an elbow splint. She has not noticed much change with the tremors and will increase gabapentin to 300-300-600mg . She also has a small left MCA aneurysm, low annual risk of rupture, control of blood pressure and re-imaging in a year (06/2015). Fall and gait precautions were again discussed. She will follow-up in 3 months.  Thank you for allowing me to participate in her care.  Please do not hesitate to call for any questions or concerns.  The duration of this appointment visit was 15 minutes of face-to-face time with the patient.  Greater than 50% of this time was spent in counseling, explanation of diagnosis, planning of further management, and coordination of care.   Ellouise Newer, M.D.   CC: Dr. Larose Kells

## 2014-12-25 NOTE — Telephone Encounter (Signed)
Forms completed by Dr. Larose Kells and faxed back to [facility], forms sent for scanning into chart.

## 2014-12-28 DIAGNOSIS — G250 Essential tremor: Secondary | ICD-10-CM | POA: Insufficient documentation

## 2014-12-28 DIAGNOSIS — G5622 Lesion of ulnar nerve, left upper limb: Secondary | ICD-10-CM | POA: Insufficient documentation

## 2015-01-02 NOTE — Telephone Encounter (Signed)
Mendel Ryder from [place] states that she never received this fax. Please refax to [phone]

## 2015-01-02 NOTE — Telephone Encounter (Signed)
Forms re-faxed successfully. JG//CMA

## 2015-01-03 NOTE — Telephone Encounter (Signed)
error:[id number] ° °

## 2015-01-14 ENCOUNTER — Encounter: Payer: Self-pay | Admitting: Internal Medicine

## 2015-01-14 ENCOUNTER — Ambulatory Visit (INDEPENDENT_AMBULATORY_CARE_PROVIDER_SITE_OTHER): Payer: Medicare Other | Admitting: Internal Medicine

## 2015-01-14 VITALS — BP 122/84 | HR 69 | Temp 97.8°F | Ht 66.0 in | Wt 232.5 lb

## 2015-01-14 DIAGNOSIS — R251 Tremor, unspecified: Secondary | ICD-10-CM

## 2015-01-14 DIAGNOSIS — E039 Hypothyroidism, unspecified: Secondary | ICD-10-CM

## 2015-01-14 DIAGNOSIS — E785 Hyperlipidemia, unspecified: Secondary | ICD-10-CM | POA: Diagnosis not present

## 2015-01-14 DIAGNOSIS — Z23 Encounter for immunization: Secondary | ICD-10-CM | POA: Diagnosis not present

## 2015-01-14 DIAGNOSIS — E119 Type 2 diabetes mellitus without complications: Secondary | ICD-10-CM | POA: Diagnosis not present

## 2015-01-14 LAB — MICROALBUMIN / CREATININE URINE RATIO
Creatinine,U: 155.3 mg/dL
Microalb Creat Ratio: 2.1 mg/g (ref 0.0–30.0)
Microalb, Ur: 3.3 mg/dL — ABNORMAL HIGH (ref 0.0–1.9)

## 2015-01-14 LAB — LIPID PANEL
Cholesterol: 184 mg/dL (ref 0–200)
HDL: 40.9 mg/dL (ref >39.00)
LDL Cholesterol: 110 mg/dL — ABNORMAL HIGH (ref 0–99)
NonHDL: 143.1
Total CHOL/HDL Ratio: 4
Triglycerides: 165 mg/dL — ABNORMAL HIGH (ref 0.0–149.0)
VLDL: 33 mg/dL (ref 0.0–40.0)

## 2015-01-14 LAB — HEMOGLOBIN A1C: Hgb A1c MFr Bld: 7.1 % — ABNORMAL HIGH (ref 4.6–6.5)

## 2015-01-14 LAB — TSH: TSH: 3.2 u[IU]/mL (ref 0.35–4.50)

## 2015-01-14 NOTE — Assessment & Plan Note (Addendum)
Continue with Zetia, check a chol panel  although she is not fasting (pt will have a hard time getting back to the office  fasting)

## 2015-01-14 NOTE — Progress Notes (Signed)
Pre visit review using our clinic review tool, if applicable. No additional management support is needed unless otherwise documented below in the visit note. 

## 2015-01-14 NOTE — Patient Instructions (Signed)
Please come back in 4 months, fasting for a physical exam

## 2015-01-14 NOTE — Assessment & Plan Note (Signed)
On no medications, last A1c 7.0 which is satisfactory under the circumstances. Plan: Check A1c

## 2015-01-14 NOTE — Assessment & Plan Note (Signed)
Continue Synthroid, labs

## 2015-01-14 NOTE — Progress Notes (Signed)
Subjective:    Patient ID: Dawn Morrow, female    DOB: 1935-09-22, 79 y.o.   MRN: [id number]  DOS:  01/14/2015 Type of visit - description : rov Interval history: Since the last visit, saw neurology, for tremor and left ulnar neuropathy. Gabapentin dose was increased. Reports that the left hand numbness is still there, better?. Tremors about the same Went to the ER, apparently with chest pain, symptoms resolve.  Med list reviewed, patient does not know what she is -or not- taking  Review of Systems In general feels okay. No chest pain or difficulty breathing at this point according to the patient No  nausea, vomiting, diarrhea. No hemoptysis. Occasional dizziness  Past Medical History  Diagnosis Date  . Asthma   . COPD (chronic obstructive pulmonary disease)   . DVT (deep vein thrombosis) in pregnancy     hx x multiple on coumadin  . Pulmonary embolism     x 2  . GERD (gastroesophageal reflux disease)   . Hypertension   . Osteopenia   . Hypothyroidism   . Hyperlipidemia   . Osteoarthritis     h/o spinal stenosis by MRI 2009  . Hiatal hernia   . Diverticulosis   . Hemorrhoids   . Tubular adenoma of colon 07/2005  . Anxiety and depression   . Cataract   . Stroke     MINI  . Diabetes mellitus     Past Surgical History  Procedure Laterality Date  . Appendectomy    . Cholecystectomy    . Tubal ligation    . Tonsillectomy    . Lumbar fusion    . Cataract extraction    . Colonoscopy    . Polypectomy      History   Social History  . Marital Status: Divorced    Spouse Name: N/A  . Number of Children: 3  . Years of Education: N/A   Occupational History  . retired    Social History Main Topics  . Smoking status: Former Smoker    Types: Cigarettes  . Smokeless tobacco: Never Used  . Alcohol Use: No     Comment: socially  . Drug Use: No  . Sexual Activity: Not on file   Other Topics Concern  . Not on file   Social History Narrative   Single,   lost  her sister in 2012. 3 children , all live in [place] to [place] late 10-2013   [employer], lives in [place]; nice [place] in [place] [phone], [phone])   Uses a diaper.     Doesn't drive          Medication List       This list is accurate as of: 01/14/15  6:14 PM.  Always use your most recent med list.               albuterol 108 (90 BASE) MCG/ACT inhaler  Commonly known as:  VENTOLIN HFA  Inhale 2 puffs into the lungs every 4 (four) hours as needed.     aspirin-acetaminophen-caffeine 716-967-89 MG per tablet  Commonly known as:  EXCEDRIN MIGRAINE  Take 2 tablets by mouth every 6 (six) hours as needed for headache (headache).     buPROPion 300 MG 24 hr tablet  Commonly known as:  WELLBUTRIN XL  Take 300 mg by mouth daily.     carvedilol 12.5 MG tablet  Commonly known as:  COREG  Take 1 tablet twice a day  ezetimibe 10 MG tablet  Commonly known as:  ZETIA  Take 10 mg by mouth daily.     gabapentin 300 MG capsule  Commonly known as:  NEURONTIN  Take 1 cap in AM, 1 cap at noon, 2 caps at bedtime     levothyroxine 125 MCG tablet  Commonly known as:  SYNTHROID, LEVOTHROID  Take 125 mcg by mouth daily before breakfast.     meclizine 25 MG tablet  Commonly known as:  ANTIVERT  Take 1 tablet (25 mg total) by mouth at bedtime.     MUCINEX DM 30-600 MG Tb12  Take 1 tablet by mouth 2 (two) times daily as needed.     oxyCODONE-acetaminophen 5-325 MG per tablet  Commonly known as:  ROXICET  Take 1 tablet by mouth every 6 (six) hours as needed for severe pain.     rivaroxaban 20 MG Tabs tablet  Commonly known as:  XARELTO  Take 1 tablet (20 mg total) by mouth daily with supper.           Objective:   Physical Exam BP 122/84 mmHg  Pulse 69  Temp(Src) 97.8 F (36.6 C) (Oral)  Ht 5\' 6"  (1.676 m)  Wt 232 lb 8 oz (105.461 kg)  BMI 37.54 kg/m2  SpO2 97% General:   Well developed, well nourished . NAD.  HEENT:  Normocephalic . Face  symmetric, atraumatic Lungs:  CTA B Normal respiratory effort, no intercostal retractions, no accessory muscle use. Heart: RRR,  no murmur.  no pretibial edema bilaterally   Skin: Not pale. Not jaundice Neurologic:  alert & oriented X3.  Speech -gait (assisted)-- at baseline. Minimal tremor noted Psych--  Behavior appropriate. No anxious or depressed appearing.    Assessment & Plan:

## 2015-01-14 NOTE — Assessment & Plan Note (Signed)
Per neurology. States is not better, however on exam she seems to be doing very well. She is also taking gabapentin under the direction of neurology mostly for a ulnar neuropathy.

## 2015-01-17 MED ORDER — ATORVASTATIN CALCIUM 10 MG PO TABS: 10.0000 mg | ORAL_TABLET | Freq: Every day | ORAL | Status: DC

## 2015-01-17 NOTE — Addendum Note (Signed)
Addended by: Wilfrid Lund on: 01/17/2015 09:38 AM   Modules accepted: Orders

## 2015-02-28 ENCOUNTER — Telehealth: Payer: Self-pay | Admitting: Internal Medicine

## 2015-02-28 NOTE — Telephone Encounter (Signed)
Caller name:Carmella Mankin  Relation to pt: Associate from [facility]  Call back number: [phone]    Reason for call:   Checking on the status of physicians orders forms, forms were dropped off June 15th would like to pick up 03/01/15

## 2015-03-01 NOTE — Telephone Encounter (Signed)
Called and left detailed message for Dawn Morrow that forms are ready for pick up at our front desk. Copy sent for scanning. JG//CMA

## 2015-03-25 ENCOUNTER — Ambulatory Visit: Payer: Medicare Other | Admitting: Neurology

## 2015-03-26 ENCOUNTER — Encounter: Payer: Self-pay | Admitting: Adult Health

## 2015-03-26 ENCOUNTER — Ambulatory Visit (INDEPENDENT_AMBULATORY_CARE_PROVIDER_SITE_OTHER): Payer: Medicare Other | Admitting: Adult Health

## 2015-03-26 VITALS — BP 130/80 | HR 78 | Temp 98.0°F | Ht 66.0 in | Wt 235.3 lb

## 2015-03-26 DIAGNOSIS — L259 Unspecified contact dermatitis, unspecified cause: Secondary | ICD-10-CM | POA: Diagnosis not present

## 2015-03-26 MED ORDER — TRIAMCINOLONE ACETONIDE 0.1 % EX CREA: 1.0000 "application " | TOPICAL_CREAM | Freq: Two times a day (BID) | CUTANEOUS | Status: DC

## 2015-03-26 NOTE — Progress Notes (Signed)
Subjective:    Patient ID: Dawn Morrow, female    DOB: [birth year], 79 y.o.   MRN: [id number]  HPI 79 year old female presents to the office with the complaint of rash on right knee for a month. Her only complaint is that it itches. She denies any spreading of the rash, is unsure of what it could be from. She denies having it elsewhere on here body. Has never had this type of rash before.   Denies any fever, pain, swelling or discharge form rash.   Has not been exposed to poison ivy or oak. She has not changed detergents or soaps.    Review of Systems  Respiratory: Negative.   Cardiovascular: Negative.   Skin: Positive for rash.  Allergic/Immunologic: Negative.   All other systems reviewed and are negative.  Past Medical History  Diagnosis Date  . Asthma   . COPD (chronic obstructive pulmonary disease)   . DVT (deep vein thrombosis) in pregnancy     hx x multiple on coumadin  . Pulmonary embolism     x 2  . GERD (gastroesophageal reflux disease)   . Hypertension   . Osteopenia   . Hypothyroidism   . Hyperlipidemia   . Osteoarthritis     h/o spinal stenosis by MRI 2009  . Hiatal hernia   . Diverticulosis   . Hemorrhoids   . Tubular adenoma of colon 07/2005  . Anxiety and depression   . Cataract   . Stroke     MINI  . Diabetes mellitus     History   Social History  . Marital Status: Divorced    Spouse Name: N/A  . Number of Children: 3  . Years of Education: N/A   Occupational History  . retired    Social History Main Topics  . Smoking status: Former Smoker    Types: Cigarettes  . Smokeless tobacco: Never Used  . Alcohol Use: No     Comment: socially  . Drug Use: No  . Sexual Activity: Not on file   Other Topics Concern  . Not on file   Social History Narrative   Single,   lost her sister in 2012. 3 children , all live in [place] to [place] late 10-2013   [employer], lives in [place]; nice [place] in [place] [phone], [phone])    Uses a diaper.     Doesn't drive      Past Surgical History  Procedure Laterality Date  . Appendectomy    . Cholecystectomy    . Tubal ligation    . Tonsillectomy    . Lumbar fusion    . Cataract extraction    . Colonoscopy    . Polypectomy      Family History  Problem Relation Age of Onset  . Adopted: Yes  . Cancer Mother     ? colon or ovarian  . Diabetes Mother   . Cancer Brother     ?    Allergies  Allergen Reactions  . Penicillins Anaphylaxis  . Codeine Other (See Comments)    unknown  . Sulfonamide Derivatives Other (See Comments)    unknown    Current Outpatient Prescriptions on File Prior to Visit  Medication Sig Dispense Refill  . albuterol (VENTOLIN HFA) 108 (90 BASE) MCG/ACT inhaler Inhale 2 puffs into the lungs every 4 (four) hours as needed. 1 Inhaler 6  . aspirin-acetaminophen-caffeine (EXCEDRIN MIGRAINE) 272-536-64 MG per tablet Take 2 tablets by  mouth every 6 (six) hours as needed for headache (headache).     Marland Kitchen atorvastatin (LIPITOR) 10 MG tablet Take 1 tablet (10 mg total) by mouth at bedtime. 30 tablet 3  . buPROPion (WELLBUTRIN XL) 300 MG 24 hr tablet Take 300 mg by mouth daily.    . carvedilol (COREG) 12.5 MG tablet Take 1 tablet twice a day 180 tablet 3  . Dextromethorphan-Guaifenesin (MUCINEX DM) 30-600 MG TB12 Take 1 tablet by mouth 2 (two) times daily as needed.    . ezetimibe (ZETIA) 10 MG tablet Take 10 mg by mouth daily.    Marland Kitchen gabapentin (NEURONTIN) 300 MG capsule Take 1 cap in AM, 1 cap at noon, 2 caps at bedtime 120 capsule 6  . levothyroxine (SYNTHROID, LEVOTHROID) 125 MCG tablet Take 125 mcg by mouth daily before breakfast.    . meclizine (ANTIVERT) 25 MG tablet Take 1 tablet (25 mg total) by mouth at bedtime. 30 tablet 6  . oxyCODONE-acetaminophen (ROXICET) 5-325 MG per tablet Take 1 tablet by mouth every 6 (six) hours as needed for severe pain. 30 tablet 0  . rivaroxaban (XARELTO) 20 MG TABS tablet Take 1 tablet (20 mg total) by mouth  daily with supper. 30 tablet 3   Current Facility-Administered Medications on File Prior to Visit  Medication Dose Route Frequency Provider Last Rate Last Dose  . zoledronic acid (RECLAST) injection 5 mg  5 mg Intravenous Once Colon Branch, MD        BP 130/80 mmHg  Pulse 78  Temp(Src) 98 F (36.7 C) (Oral)  Ht 5\' 6"  (1.676 m)  Wt 235 lb 4.8 oz (106.731 kg)  BMI 38.00 kg/m2  SpO2 91%       Objective:   Physical Exam  Constitutional: She is oriented to person, place, and time. She appears well-developed and well-nourished. No distress.  Neurological: She is alert and oriented to person, place, and time.  Skin: Skin is warm and dry. Rash noted. She is not diaphoretic. No erythema. No pallor.  Erythremic slightly scaly papular rash on right knee.   No signs of infection.   Psychiatric: She has a normal mood and affect. Her behavior is normal. Judgment and thought content normal.  Nursing note and vitals reviewed.     Assessment & Plan:  1. Contact dermatitis - triamcinolone cream (KENALOG) 0.1 %; Apply 1 application topically 2 (two) times daily.  Dispense: 30 g; Refill: 2 - Follow up in 1-2 weeks if no improvement.  - Follow up sooner if rash becomes worse or there are signs of infection

## 2015-03-26 NOTE — Patient Instructions (Signed)
It appears you have a contact dermatitis. Unfortunantly we do not know what you are allergic to. Please use the Kenalog cream twice a day for the next two weeks. If you do not notice any improvement by then or the rash is becoming worse, then please let me know.

## 2015-03-26 NOTE — Progress Notes (Signed)
Pre visit review using our clinic review tool, if applicable. No additional management support is needed unless otherwise documented below in the visit note. 

## 2015-04-11 LAB — HM DIABETES EYE EXAM

## 2015-04-26 ENCOUNTER — Telehealth: Payer: Self-pay | Admitting: Internal Medicine

## 2015-04-26 NOTE — Telephone Encounter (Signed)
Pre visit letter mailed 04/26/15

## 2015-05-15 ENCOUNTER — Telehealth: Payer: Self-pay | Admitting: Behavioral Health

## 2015-05-15 NOTE — Telephone Encounter (Signed)
Unable to reach patient at time of Pre-Visit Call.  Left message for patient to return call when available.    

## 2015-05-17 ENCOUNTER — Encounter: Payer: Self-pay | Admitting: Internal Medicine

## 2015-05-17 ENCOUNTER — Ambulatory Visit (INDEPENDENT_AMBULATORY_CARE_PROVIDER_SITE_OTHER): Payer: Medicare Other | Admitting: Internal Medicine

## 2015-05-17 VITALS — BP 126/78 | HR 64 | Temp 98.2°F | Ht 66.0 in | Wt 230.0 lb

## 2015-05-17 DIAGNOSIS — Z0000 Encounter for general adult medical examination without abnormal findings: Secondary | ICD-10-CM

## 2015-05-17 DIAGNOSIS — Z780 Asymptomatic menopausal state: Secondary | ICD-10-CM

## 2015-05-17 DIAGNOSIS — E119 Type 2 diabetes mellitus without complications: Secondary | ICD-10-CM

## 2015-05-17 DIAGNOSIS — I10 Essential (primary) hypertension: Secondary | ICD-10-CM

## 2015-05-17 DIAGNOSIS — E785 Hyperlipidemia, unspecified: Secondary | ICD-10-CM

## 2015-05-17 DIAGNOSIS — E039 Hypothyroidism, unspecified: Secondary | ICD-10-CM | POA: Diagnosis not present

## 2015-05-17 DIAGNOSIS — Z09 Encounter for follow-up examination after completed treatment for conditions other than malignant neoplasm: Secondary | ICD-10-CM

## 2015-05-17 DIAGNOSIS — H9190 Unspecified hearing loss, unspecified ear: Secondary | ICD-10-CM

## 2015-05-17 LAB — BASIC METABOLIC PANEL
BUN: 20 mg/dL (ref 6–23)
CO2: 32 mEq/L (ref 19–32)
Calcium: 9.1 mg/dL (ref 8.4–10.5)
Chloride: 103 mEq/L (ref 96–112)
Creatinine, Ser: 1.36 mg/dL — ABNORMAL HIGH (ref 0.40–1.20)
GFR: 40.05 mL/min — ABNORMAL LOW (ref >60.00)
Glucose, Bld: 155 mg/dL — ABNORMAL HIGH (ref 70–99)
Potassium: 4.8 mEq/L (ref 3.5–5.1)
Sodium: 143 mEq/L (ref 135–145)

## 2015-05-17 LAB — LIPID PANEL
Cholesterol: 130 mg/dL (ref 0–200)
HDL: 35.8 mg/dL — ABNORMAL LOW (ref >39.00)
LDL Cholesterol: 71 mg/dL (ref 0–99)
NonHDL: 94.15
Total CHOL/HDL Ratio: 4
Triglycerides: 118 mg/dL (ref 0.0–149.0)
VLDL: 23.6 mg/dL (ref 0.0–40.0)

## 2015-05-17 LAB — TSH: TSH: 1.52 u[IU]/mL (ref 0.35–4.50)

## 2015-05-17 LAB — HEMOGLOBIN A1C: Hgb A1c MFr Bld: 7.9 % — ABNORMAL HIGH (ref 4.6–6.5)

## 2015-05-17 LAB — AST: AST: 15 U/L (ref 0–37)

## 2015-05-17 LAB — ALT: ALT: 13 U/L (ref 0–35)

## 2015-05-17 MED ORDER — HYDROCODONE-ACETAMINOPHEN 5-325 MG PO TABS: 1.0000 | ORAL_TABLET | Freq: Three times a day (TID) | ORAL | Status: DC | PRN

## 2015-05-17 NOTE — Progress Notes (Signed)
Pre visit review using our clinic review tool, if applicable. No additional management support is needed unless otherwise documented below in the visit note. 

## 2015-05-17 NOTE — Assessment & Plan Note (Addendum)
Td 09;  Pneumovax  2005 and 2010, prevnar 2016; Shingles shot- 2012; Flu shot at [facility]    Pap Smear declined further PAPs Mammogram: 11-2010 neg , desires no further breast ca screening   Cscope 07-08-05 colon polyp, int hemorroids, TICs, Bx tubular adenomas; repeated Cscope 9-12, multiple polyps. Was due for a cscope 2015, states she does not like to proceed:  "absolutely no"; she is high risk for precedures, thus no further screening  Diet and exercise discussed

## 2015-05-17 NOTE — Assessment & Plan Note (Signed)
Diabetes: On diet control, check A1c. Hypertension: Seems well-controlled, continue present care, check a BMP High cholesterol: based on last cholesterol panel, Lipitor was restarted. Check labs. Hypothyroidism: Good compliance with medications, check a TSH MSK, spinal stenosis: Reports oxycodone make her sick, will switch back to hydrocodone that she use to take Osteoporosis: Status post Boniva for approximately 5 years, questionable compliance, it was discontinued in 2013. Subsequently DEXA showed osteoporosis but we were unable to get Reclast or prolia for her .  Plan: Get a bone density, I understand that now Reclast is covered by Medicare. Follow-up 4 months

## 2015-05-17 NOTE — Progress Notes (Signed)
Subjective:    Patient ID: Dawn Morrow, female    DOB: [birth year], 79 y.o.   MRN: [id number]  DOS:  05/17/2015 Type of visit - description :  Interval history: Here for Medicare AWV:  1. Risk factors based on Past M, S, F history: reviewed 2. Physical Activities: not very active  3. Depression/mood: neg screening  4. Hearing:  HOH -----------refer to audiology 5. ADL's: lives at an ALF, she is provided w/ house cleaning, food, medications  6. Fall Risk: no recent falls, prevention discussed , see AVS 7. home Safety: does feel safe at home  8. Height, weight, & visual acuity: see VS, sees eye doctor regulalrly 9. Counseling: provided 10. Labs ordered based on risk factors: if needed  11. Referral Coordination: if needed 12. Care Plan, see assessment and plan , written personalized plan provided , see AVS 13. Cognitive Assessment: motor skills decreased, uses a walker ;cognition stable and normal 14. Care team updated   15. End-of-life care discussed  In addition, today we discussed the following: Hypertension: Good compliance of medication, ambulatory BPs 150/70 Hypothyroidism: cont Synthroid. Due for a TSH History of DVTs PEs: on xarelto, good compliance, no apparent complications Back pain: On oxycodone as needed, reports this make her really sick, request a change in medication. High cholesterol: Good compliance with medications, due for labs  Review of Systems Constitutional: No fever. No chills. No unexplained wt changes. No unusual sweats  HEENT: No dental problems, no ear discharge, no facial swelling, no voice changes. No eye discharge, no eye  redness , no  intolerance to light   Respiratory: No wheezing , no  difficulty breathing. No cough , no mucus production  Cardiovascular: No CP, no leg swelling , no  Palpitations  GI: no nausea, no vomiting, no diarrhea , no  abdominal pain.  No blood in the stools. No dysphagia, no odynophagia    Endocrine: No polyphagia,  no polyuria , no polydipsia  GU: No dysuria, gross hematuria, difficulty urinating. No urinary urgency, no frequency.  Musculoskeletal: No joint swellings or unusual aches or pains  Skin: No change in the color of the skin, palor , no  Rash  Allergic, immunologic: No environmental allergies , no  food allergies  Neurological: No dizziness no  syncope. No headaches. No diplopia, no slurred, no slurred speech, no motor deficits, no facial  Numbness  Hematological: No enlarged lymph nodes, no easy bruising , no unusual bleedings  Psychiatry: No suicidal ideas, no hallucinations, no beavior problems, no confusion.  No unusual/severe anxiety, no depression   Past Medical History  Diagnosis Date  . Asthma   . COPD (chronic obstructive pulmonary disease)   . DVT (deep vein thrombosis) in pregnancy     hx x multiple on coumadin  . Pulmonary embolism     x 2  . GERD (gastroesophageal reflux disease)   . Hypertension   . Osteopenia   . Hypothyroidism   . Hyperlipidemia   . Osteoarthritis     h/o spinal stenosis by MRI 2009  . Hiatal hernia   . Diverticulosis   . Hemorrhoids   . Tubular adenoma of colon 07/2005  . Anxiety and depression   . Cataract   . Stroke     MINI  . Diabetes mellitus     Past Surgical History  Procedure Laterality Date  . Appendectomy    . Cholecystectomy    . Tubal ligation    . Tonsillectomy    .  Lumbar fusion    . Cataract extraction    . Colonoscopy    . Polypectomy      Social History   Social History  . Marital Status: Divorced    Spouse Name: N/A  . Number of Children: 3  . Years of Education: N/A   Occupational History  . retired    Social History Main Topics  . Smoking status: Former Smoker    Types: Cigarettes  . Smokeless tobacco: Never Used  . Alcohol Use: No     Comment: socially  . Drug Use: No  . Sexual Activity: Not on file   Other Topics Concern  . Not on file   Social History Narrative   Single,   lost her  sister in 2012. 3 children , all live in [place] to [place] late 10-2013   [employer], lives in [place]; nice [place] in [place] [phone], [phone])   Uses a diaper.     Doesn't drive       Family History  Problem Relation Age of Onset  . Adopted: Yes  . Cancer Mother     ? colon or ovarian  . Diabetes Mother   . Cancer Brother     ?  . CAD Neg Hx        Medication List       This list is accurate as of: 05/17/15  5:26 PM.  Always use your most recent med list.               albuterol 108 (90 BASE) MCG/ACT inhaler  Commonly known as:  VENTOLIN HFA  Inhale 2 puffs into the lungs every 4 (four) hours as needed.     aspirin-acetaminophen-caffeine 160-737-10 MG per tablet  Commonly known as:  EXCEDRIN MIGRAINE  Take 2 tablets by mouth every 6 (six) hours as needed for headache (headache).     atorvastatin 10 MG tablet  Commonly known as:  LIPITOR  Take 1 tablet (10 mg total) by mouth at bedtime.     buPROPion 300 MG 24 hr tablet  Commonly known as:  WELLBUTRIN XL  Take 300 mg by mouth daily.     carvedilol 12.5 MG tablet  Commonly known as:  COREG  Take 1 tablet twice a day     ezetimibe 10 MG tablet  Commonly known as:  ZETIA  Take 10 mg by mouth daily.     gabapentin 300 MG capsule  Commonly known as:  NEURONTIN  Take 1 cap in AM, 1 cap at noon, 2 caps at bedtime     HYDROcodone-acetaminophen 5-325 MG per tablet  Commonly known as:  NORCO/VICODIN  Take 1-2 tablets by mouth every 8 (eight) hours as needed.     levothyroxine 125 MCG tablet  Commonly known as:  SYNTHROID, LEVOTHROID  Take 125 mcg by mouth daily before breakfast.     meclizine 25 MG tablet  Commonly known as:  ANTIVERT  Take 1 tablet (25 mg total) by mouth at bedtime.     MUCINEX DM 30-600 MG Tb12  Take 1 tablet by mouth 2 (two) times daily as needed.     rivaroxaban 20 MG Tabs tablet  Commonly known as:  XARELTO  Take 1 tablet (20 mg total) by mouth daily with  supper.     SYSTANE BALANCE 0.6 % Soln  Generic drug:  Propylene Glycol  Place 1 drop into both eyes 3 (three) times daily as needed.  triamcinolone cream 0.1 %  Commonly known as:  KENALOG  Apply 1 application topically 2 (two) times daily.           Objective:   Physical Exam BP 126/78 mmHg  Pulse 64  Temp(Src) 98.2 F (36.8 C) (Oral)  Ht 5\' 6"  (1.676 m)  Wt 230 lb (104.327 kg)  BMI 37.14 kg/m2  SpO2 94% General:   Well developed, well nourished . NAD.  HEENT:  Normocephalic . Face symmetric, atraumatic Lungs:  CTA B, on oxygen Normal respiratory effort, no intercostal retractions, no accessory muscle use. Heart: RRR,  no murmur.  no pretibial edema bilaterally  Abdomen:  Not distended, soft, non-tender. No rebound or rigidity.   Skin: Not pale. Not jaundice Neurologic:  alert & oriented X3.  Speech normal, gait slow and assisted by a walker. At baseline Psych--  Cognition and judgment appear intact.  Cooperative with normal attention span and concentration.  Behavior appropriate. No anxious or depressed appearing.    Assessment & Plan:   Problem list > Diabetes Hypertension Hypothyroidism Hyperlipidemia Hematology: on xarelto --Multiple DVTs --PE 2 Anxiety and depression COPD asthma,home  O2 Neuro: - -h/o  stroke --Tremor  GI: --GERD, diverticulosis, HH, hemorrhoids, MSK: --Osteoporosis, normal vitamin D  --DJD --Spinal stenosis, MRI 2009  A/p Diabetes: On diet control, check A1c. Hypertension: Seems well-controlled, continue present care, check a BMP High cholesterol: based on last cholesterol panel, Lipitor was restarted. Check labs. Hypothyroidism: Good compliance with medications, check a TSH MSK, spinal stenosis: Reports oxycodone make her sick, will switch back to hydrocodone that she use to take Osteoporosis: Status post Boniva for approximately 5 years, questionable compliance, it was discontinued in 2013. Subsequently DEXA  showed osteoporosis but we were unable to get Reclast or prolia for her .  Plan: Get a bone density, I understand that now Reclast is covered by Medicare. Follow-up 4 months

## 2015-05-17 NOTE — Patient Instructions (Signed)
Get your blood work before you leave    For pain, take hydrocodone as needed    Next visit  for a  routine checkup in 4 months, 30 minutes. Please schedule an appointment at the front desk No need to come back fasting      Fall Prevention and Home Safety Falls cause injuries and can affect all age groups. It is possible to use preventive measures to significantly decrease the likelihood of falls. There are many simple measures which can make your home safer and prevent falls. OUTDOORS  Repair cracks and edges of walkways and driveways.  Remove high doorway thresholds.  Trim shrubbery on the main path into your home.  Have good outside lighting.  Clear walkways of tools, rocks, debris, and clutter.  Check that handrails are not broken and are securely fastened. Both sides of steps should have handrails.  Have leaves, snow, and ice cleared regularly.  Use sand or salt on walkways during winter months.  In the garage, clean up grease or oil spills. BATHROOM  Install night lights.  Install grab bars by the toilet and in the tub and shower.  Use non-skid mats or decals in the tub or shower.  Place a plastic non-slip stool in the shower to sit on, if needed.  Keep floors dry and clean up all water on the floor immediately.  Remove soap buildup in the tub or shower on a regular basis.  Secure bath mats with non-slip, double-sided rug tape.  Remove throw rugs and tripping hazards from the floors. BEDROOMS  Install night lights.  Make sure a bedside light is easy to reach.  Do not use oversized bedding.  Keep a telephone by your bedside.  Have a firm chair with side arms to use for getting dressed.  Remove throw rugs and tripping hazards from the floor. KITCHEN  Keep handles on pots and pans turned toward the center of the stove. Use back burners when possible.  Clean up spills quickly and allow time for drying.  Avoid walking on wet floors.  Avoid hot  utensils and knives.  Position shelves so they are not too high or low.  Place commonly used objects within easy reach.  If necessary, use a sturdy step stool with a grab bar when reaching.  Keep electrical cables out of the way.  Do not use floor polish or wax that makes floors slippery. If you must use wax, use non-skid floor wax.  Remove throw rugs and tripping hazards from the floor. STAIRWAYS  Never leave objects on stairs.  Place handrails on both sides of stairways and use them. Fix any loose handrails. Make sure handrails on both sides of the stairways are as long as the stairs.  Check carpeting to make sure it is firmly attached along stairs. Make repairs to worn or loose carpet promptly.  Avoid placing throw rugs at the top or bottom of stairways, or properly secure the rug with carpet tape to prevent slippage. Get rid of throw rugs, if possible.  Have an electrician put in a light switch at the top and bottom of the stairs. OTHER FALL PREVENTION TIPS  Wear low-heel or rubber-soled shoes that are supportive and fit well. Wear closed toe shoes.  When using a stepladder, make sure it is fully opened and both spreaders are firmly locked. Do not climb a closed stepladder.  Add color or contrast paint or tape to grab bars and handrails in your home. Place contrasting color strips on  first and last steps.  Learn and use mobility aids as needed. Install an electrical emergency response system.  Turn on lights to avoid dark areas. Replace light bulbs that burn out immediately. Get light switches that glow.  Arrange furniture to create clear pathways. Keep furniture in the same place.  Firmly attach carpet with non-skid or double-sided tape.  Eliminate uneven floor surfaces.  Select a carpet pattern that does not visually hide the edge of steps.  Be aware of all pets. OTHER HOME SAFETY TIPS  Set the water temperature for 120 F (48.8 C).  Keep emergency numbers on  or near the telephone.  Keep smoke detectors on every level of the home and near sleeping areas. Document Released: 08/14/2002 Document Revised: 02/23/2012 Document Reviewed: 11/13/2011 [facility] Patient Information 2015 [place], [place]. This information is not intended to replace advice given to you by your health care provider. Make sure you discuss any questions you have with your health care provider.   Preventive Care for Adults Ages 37 and over  Blood pressure check.** / Every 1 to 2 years.  Lipid and cholesterol check.**/ Every 5 years beginning at age 50.  Lung cancer screening. / Every year if you are aged 81-80 years and have a 30-pack-year history of smoking and currently smoke or have quit within the past 15 years. Yearly screening is stopped once you have quit smoking for at least 15 years or develop a health problem that would prevent you from having lung cancer treatment.  Fecal occult blood test (FOBT) of stool. / Every year beginning at age 2 and continuing until age 52. You may not have to do this test if you get a colonoscopy every 10 years.  Flexible sigmoidoscopy** or colonoscopy.** / Every 5 years for a flexible sigmoidoscopy or every 10 years for a colonoscopy beginning at age 36 and continuing until age 77.  Hepatitis C blood test.** / For all people born from [birth year] through 1963 and any individual with known risks for hepatitis C.  Abdominal aortic aneurysm (AAA) screening.** / A one-time screening for ages 52 to 44 years who are current or former smokers.  Skin self-exam. / Monthly.  Influenza vaccine. / Every year.  Tetanus, diphtheria, and acellular pertussis (Tdap/Td) vaccine.** / 1 dose of Td every 10 years.  Varicella vaccine.** / Consult your health care provider.  Zoster vaccine.** / 1 dose for adults aged 48 years or older.  Pneumococcal 13-valent conjugate (PCV13) vaccine.** / Consult your health care provider.  Pneumococcal polysaccharide (PPSV23)  vaccine.** / 1 dose for all adults aged 50 years and older.  Meningococcal vaccine.** / Consult your health care provider.  Hepatitis A vaccine.** / Consult your health care provider.  Hepatitis B vaccine.** / Consult your health care provider.  Haemophilus influenzae type b (Hib) vaccine.** / Consult your health care provider. **Family history and personal history of risk and conditions may change your health care provider's recommendations. Document Released: 10/20/2001 Document Revised: 08/29/2013 Document Reviewed: 01/19/2011 [facility] Patient Information 2015 [place], [place]. This information is not intended to replace advice given to you by your health care provider. Make sure you discuss any questions you have with your health care provider.

## 2015-05-22 ENCOUNTER — Telehealth: Payer: Self-pay | Admitting: Internal Medicine

## 2015-05-22 NOTE — Telephone Encounter (Signed)
Received fax confirmation on 05/22/2015 at 1137.

## 2015-05-22 NOTE — Telephone Encounter (Signed)
OV note and lab results from 05/17/2015 printed and faxed to Baxter Flattery, [place] at [facility] at [phone]. Last A1c drawn on 05/17/2015. Written orders given for diabetic education and medication management.

## 2015-05-22 NOTE — Telephone Encounter (Signed)
Caller name: [place]  Call back number: [phone]   Reason for call:  Verbal orders regarding diabetes teaching and new medication discussion. LPN requesting last office visit notes please fax to [phone] and would like to know when last A1C was drawn.

## 2015-05-24 ENCOUNTER — Telehealth: Payer: Self-pay | Admitting: Internal Medicine

## 2015-05-24 NOTE — Telephone Encounter (Signed)
Caller name:Carrie Eppe (nurse)  Relationship to patient:  Can be [phone] Pharmacy:  Reason for call:  Requesting verbal orders for skilled nursing for observation and assessment endocrine system cbg monitoring medication affectiveness (tradjenta) and DM teaching and management frequent 1 week 1 , 2 week 4

## 2015-05-24 NOTE — Telephone Encounter (Signed)
LMOM with verbal orders for DM teaching, and medication management.

## 2015-05-24 NOTE — Telephone Encounter (Signed)
Okay for verbal orders. 

## 2015-05-24 NOTE — Telephone Encounter (Signed)
ok 

## 2015-05-29 ENCOUNTER — Other Ambulatory Visit: Payer: Self-pay | Admitting: Internal Medicine

## 2015-05-29 DIAGNOSIS — M810 Age-related osteoporosis without current pathological fracture: Secondary | ICD-10-CM

## 2015-05-30 ENCOUNTER — Telehealth: Payer: Self-pay | Admitting: Internal Medicine

## 2015-05-30 NOTE — Telephone Encounter (Signed)
Received fax confirmation on 05/30/2015 at 1407.

## 2015-05-30 NOTE — Telephone Encounter (Signed)
Caller name: Tania  Relationship to patient: [facility]  Can be reached: [phone] ext [phone] Pharmacy:  Reason for call: She is calling to check the status of the physician order form for diabetic test supply.

## 2015-05-30 NOTE — Telephone Encounter (Signed)
Have not seen diabetic order for supplies. Will forward to Dawn Morrow- Have you seen any diabetic supply orders for this Pt from [facility]?

## 2015-05-30 NOTE — Telephone Encounter (Signed)
Received physician orders for DM teaching and medication management on 05/24/2015. Signed by Dr. Larose Kells and faxed back to [facility] at [phone]. Copy sent for scanning.

## 2015-05-31 ENCOUNTER — Emergency Department (HOSPITAL_COMMUNITY)
Admission: EM | Admit: 2015-05-31 | Discharge: 2015-05-31 | Disposition: A | Discharge status: Home / Self Care | Payer: Medicare Other | Attending: Emergency Medicine | Admitting: Emergency Medicine

## 2015-05-31 ENCOUNTER — Emergency Department (HOSPITAL_COMMUNITY): Payer: Medicare Other

## 2015-05-31 ENCOUNTER — Encounter (HOSPITAL_COMMUNITY): Payer: Self-pay | Admitting: Emergency Medicine

## 2015-05-31 DIAGNOSIS — J449 Chronic obstructive pulmonary disease, unspecified: Secondary | ICD-10-CM | POA: Diagnosis not present

## 2015-05-31 DIAGNOSIS — Z8673 Personal history of transient ischemic attack (TIA), and cerebral infarction without residual deficits: Secondary | ICD-10-CM | POA: Insufficient documentation

## 2015-05-31 DIAGNOSIS — E785 Hyperlipidemia, unspecified: Secondary | ICD-10-CM | POA: Diagnosis not present

## 2015-05-31 DIAGNOSIS — Z79899 Other long term (current) drug therapy: Secondary | ICD-10-CM | POA: Insufficient documentation

## 2015-05-31 DIAGNOSIS — R531 Weakness: Secondary | ICD-10-CM

## 2015-05-31 DIAGNOSIS — Z86718 Personal history of other venous thrombosis and embolism: Secondary | ICD-10-CM | POA: Insufficient documentation

## 2015-05-31 DIAGNOSIS — Z880 Allergy status to penicillin: Secondary | ICD-10-CM | POA: Insufficient documentation

## 2015-05-31 DIAGNOSIS — E039 Hypothyroidism, unspecified: Secondary | ICD-10-CM | POA: Insufficient documentation

## 2015-05-31 DIAGNOSIS — Z7982 Long term (current) use of aspirin: Secondary | ICD-10-CM | POA: Diagnosis not present

## 2015-05-31 DIAGNOSIS — Z87891 Personal history of nicotine dependence: Secondary | ICD-10-CM | POA: Diagnosis not present

## 2015-05-31 DIAGNOSIS — E669 Obesity, unspecified: Secondary | ICD-10-CM | POA: Diagnosis not present

## 2015-05-31 DIAGNOSIS — Z86010 Personal history of colonic polyps: Secondary | ICD-10-CM | POA: Diagnosis not present

## 2015-05-31 DIAGNOSIS — N390 Urinary tract infection, site not specified: Secondary | ICD-10-CM | POA: Diagnosis not present

## 2015-05-31 DIAGNOSIS — K219 Gastro-esophageal reflux disease without esophagitis: Secondary | ICD-10-CM | POA: Insufficient documentation

## 2015-05-31 DIAGNOSIS — I10 Essential (primary) hypertension: Secondary | ICD-10-CM | POA: Insufficient documentation

## 2015-05-31 DIAGNOSIS — Z86711 Personal history of pulmonary embolism: Secondary | ICD-10-CM | POA: Insufficient documentation

## 2015-05-31 LAB — CBC
HCT: 40.8 % (ref 36.0–46.0)
Hemoglobin: 12.6 g/dL (ref 12.0–15.0)
MCH: 29.5 pg (ref 26.0–34.0)
MCHC: 30.9 g/dL (ref 30.0–36.0)
MCV: 95.6 fL (ref 78.0–100.0)
Platelets: 164 10*3/uL (ref 150–400)
RBC: 4.27 MIL/uL (ref 3.87–5.11)
RDW: 14.3 % (ref 11.5–15.5)
WBC: 7.7 10*3/uL (ref 4.0–10.5)

## 2015-05-31 LAB — BASIC METABOLIC PANEL
Anion gap: 7 (ref 5–15)
BUN: 17 mg/dL (ref 6–20)
CO2: 31 mmol/L (ref 22–32)
Calcium: 9 mg/dL (ref 8.9–10.3)
Chloride: 104 mmol/L (ref 101–111)
Creatinine, Ser: 1.5 mg/dL — ABNORMAL HIGH (ref 0.44–1.00)
GFR calc Af Amer: 38 mL/min — ABNORMAL LOW (ref >60)
GFR calc non Af Amer: 32 mL/min — ABNORMAL LOW (ref >60)
Glucose, Bld: 239 mg/dL — ABNORMAL HIGH (ref 65–99)
Potassium: 5 mmol/L (ref 3.5–5.1)
Sodium: 142 mmol/L (ref 135–145)

## 2015-05-31 LAB — URINALYSIS, ROUTINE W REFLEX MICROSCOPIC
Bilirubin Urine: NEGATIVE
Glucose, UA: NEGATIVE mg/dL
Ketones, ur: NEGATIVE mg/dL
Nitrite: NEGATIVE
Protein, ur: NEGATIVE mg/dL
Specific Gravity, Urine: 1.006 (ref 1.005–1.030)
Urobilinogen, UA: 0.2 mg/dL (ref 0.0–1.0)
pH: 7 (ref 5.0–8.0)

## 2015-05-31 LAB — URINE MICROSCOPIC-ADD ON

## 2015-05-31 LAB — I-STAT TROPONIN, ED: Troponin i, poc: 0 ng/mL (ref 0.00–0.08)

## 2015-05-31 LAB — CBG MONITORING, ED: Glucose-Capillary: 218 mg/dL — ABNORMAL HIGH (ref 65–99)

## 2015-05-31 MED ORDER — SODIUM CHLORIDE 0.9 % IV BOLUS (SEPSIS)
1000.0000 mL | Freq: Once | INTRAVENOUS | Status: AC
  Administered 2015-05-31: 1000 mL via INTRAVENOUS

## 2015-05-31 MED ORDER — FOSFOMYCIN TROMETHAMINE 3 G PO PACK
3.0000 g | PACK | Freq: Once | ORAL | Status: AC
  Administered 2015-05-31: 3 g via ORAL
  Filled 2015-05-31: qty 3

## 2015-05-31 NOTE — ED Notes (Signed)
Ambulated in hallway with walker. Did well.

## 2015-05-31 NOTE — Discharge Instructions (Signed)
Weakness Weakness is a lack of strength. It may be felt all over the body (generalized) or in one specific part of the body (focal). Some causes of weakness can be serious. You may need further medical evaluation, especially if you are elderly or you have a history of immunosuppression (such as chemotherapy or HIV), kidney disease, heart disease, or diabetes. CAUSES  Weakness can be caused by many different things, including:  Infection.  Physical exhaustion.  Internal bleeding or other blood loss that results in a lack of red blood cells (anemia).  Dehydration. This cause is more common in elderly people.  Side effects or electrolyte abnormalities from medicines, such as pain medicines or sedatives.  Emotional distress, anxiety, or depression.  Circulation problems, especially severe peripheral arterial disease.  Heart disease, such as rapid atrial fibrillation, bradycardia, or heart failure.  Nervous system disorders, such as Guillain-Barr syndrome, multiple sclerosis, or stroke. DIAGNOSIS  To find the cause of your weakness, your caregiver will take your history and perform a physical exam. Lab tests or X-rays may also be ordered, if needed. TREATMENT  Treatment of weakness depends on the cause of your symptoms and can vary greatly. HOME CARE INSTRUCTIONS   Rest as needed.  Eat a well-balanced diet.  Try to get some exercise every day.  Only take over-the-counter or prescription medicines as directed by your caregiver. SEEK MEDICAL CARE IF:   Your weakness seems to be getting worse or spreads to other parts of your body.  You develop new aches or pains. SEEK IMMEDIATE MEDICAL CARE IF:   You cannot perform your normal daily activities, such as getting dressed and feeding yourself.  You cannot walk up and down stairs, or you feel exhausted when you do so.  You have shortness of breath or chest pain.  You have difficulty moving parts of your body.  You have weakness  in only one area of the body or on only one side of the body.  You have a fever.  You have trouble speaking or swallowing.  You cannot control your bladder or bowel movements.  You have black or bloody vomit or stools. MAKE SURE YOU:  Understand these instructions.  Will watch your condition.  Will get help right away if you are not doing well or get worse. Document Released: 08/24/2005 Document Revised: 02/23/2012 Document Reviewed: 10/23/2011 [facility] Patient Information 2015 [place], [place]. This information is not intended to replace advice given to you by your health care provider. Make sure you discuss any questions you have with your health care provider.  Urinary Tract Infection - yours was treated with a dose of antibiotics and does not require further antibiotics Urinary tract infections (UTIs) can develop anywhere along your urinary tract. Your urinary tract is your body's drainage system for removing wastes and extra water. Your urinary tract includes two kidneys, two ureters, a bladder, and a urethra. Your kidneys are a pair of bean-shaped organs. Each kidney is about the size of your fist. They are located below your ribs, one on each side of your spine. CAUSES Infections are caused by microbes, which are microscopic organisms, including fungi, viruses, and bacteria. These organisms are so small that they can only be seen through a microscope. Bacteria are the microbes that most commonly cause UTIs. SYMPTOMS  Symptoms of UTIs may vary by age and gender of the patient and by the location of the infection. Symptoms in young women typically include a frequent and intense urge to urinate and a  painful, burning feeling in the bladder or urethra during urination. Older women and men are more likely to be tired, shaky, and weak and have muscle aches and abdominal pain. A fever may mean the infection is in your kidneys. Other symptoms of a kidney infection include pain in your back or  sides below the ribs, nausea, and vomiting. DIAGNOSIS To diagnose a UTI, your caregiver will ask you about your symptoms. Your caregiver also will ask to provide a urine sample. The urine sample will be tested for bacteria and white blood cells. White blood cells are made by your body to help fight infection. TREATMENT  Typically, UTIs can be treated with medication. Because most UTIs are caused by a bacterial infection, they usually can be treated with the use of antibiotics. The choice of antibiotic and length of treatment depend on your symptoms and the type of bacteria causing your infection. HOME CARE INSTRUCTIONS  If you were prescribed antibiotics, take them exactly as your caregiver instructs you. Finish the medication even if you feel better after you have only taken some of the medication.  Drink enough water and fluids to keep your urine clear or pale yellow.  Avoid caffeine, tea, and carbonated beverages. They tend to irritate your bladder.  Empty your bladder often. Avoid holding urine for long periods of time.  Empty your bladder before and after sexual intercourse.  After a bowel movement, women should cleanse from front to back. Use each tissue only once. SEEK MEDICAL CARE IF:   You have back pain.  You develop a fever.  Your symptoms do not begin to resolve within 3 days. SEEK IMMEDIATE MEDICAL CARE IF:   You have severe back pain or lower abdominal pain.  You develop chills.  You have nausea or vomiting.  You have continued burning or discomfort with urination. MAKE SURE YOU:   Understand these instructions.  Will watch your condition.  Will get help right away if you are not doing well or get worse. Document Released: 06/03/2005 Document Revised: 02/23/2012 Document Reviewed: 10/02/2011 [facility] Patient Information 2015 [place], [place]. This information is not intended to replace advice given to you by your health care provider. Make sure you discuss any  questions you have with your health care provider.

## 2015-05-31 NOTE — ED Notes (Signed)
[facility] has been called  

## 2015-05-31 NOTE — ED Provider Notes (Signed)
CSN: [id number]     Arrival date & time 05/31/15  1403 History   First MD Initiated Contact with Patient 05/31/15 1404     Chief Complaint  Patient presents with  . Weakness     (Consider location/radiation/quality/duration/timing/severity/associated sxs/prior Treatment) Patient is a 79 y.o. female presenting with weakness.  Weakness This is a new problem. The current episode started yesterday. The problem occurs constantly. The problem has not changed since onset.Pertinent negatives include no chest pain, no abdominal pain, no headaches and no shortness of breath. Nothing aggravates the symptoms. Nothing relieves the symptoms. She has tried nothing for the symptoms.    Past Medical History  Diagnosis Date  . Asthma   . COPD (chronic obstructive pulmonary disease)   . DVT (deep vein thrombosis) in pregnancy     hx x multiple on coumadin  . Pulmonary embolism     x 2  . GERD (gastroesophageal reflux disease)   . Hypertension   . Osteopenia   . Hypothyroidism   . Hyperlipidemia   . Osteoarthritis     h/o spinal stenosis by MRI 2009  . Hiatal hernia   . Diverticulosis   . Hemorrhoids   . Tubular adenoma of colon 07/2005  . Anxiety and depression   . Cataract   . Stroke     MINI  . Diabetes mellitus    Past Surgical History  Procedure Laterality Date  . Appendectomy    . Cholecystectomy    . Tubal ligation    . Tonsillectomy    . Lumbar fusion    . Cataract extraction    . Colonoscopy    . Polypectomy     Family History  Problem Relation Age of Onset  . Adopted: Yes  . Cancer Mother     ? colon or ovarian  . Diabetes Mother   . Cancer Brother     ?  . CAD Neg Hx    Social History  Substance Use Topics  . Smoking status: Former Smoker    Types: Cigarettes  . Smokeless tobacco: Never Used  . Alcohol Use: No     Comment: socially   OB History    No data available     Review of Systems  Respiratory: Negative for shortness of breath.   Cardiovascular:  Negative for chest pain.  Gastrointestinal: Negative for abdominal pain.  Neurological: Positive for weakness. Negative for headaches.  All other systems reviewed and are negative.     Allergies  Penicillins; Codeine; and Sulfonamide derivatives  Home Medications   Prior to Admission medications   Medication Sig Start Date End Date Taking? Authorizing Provider  albuterol (VENTOLIN HFA) 108 (90 BASE) MCG/ACT inhaler Inhale 2 puffs into the lungs every 4 (four) hours as needed. Patient taking differently: Inhale 2 puffs into the lungs every 4 (four) hours as needed for wheezing.  09/28/11  Yes Colon Branch, MD  aspirin-acetaminophen-caffeine [facility] MIGRAINE) [phone] MG per tablet Take 2 tablets by mouth every 6 (six) hours as needed for headache (headache).    Yes Historical Provider, MD  atorvastatin (LIPITOR) 10 MG tablet Take 1 tablet (10 mg total) by mouth at bedtime. 01/17/15  Yes Colon Branch, MD  buPROPion (WELLBUTRIN XL) 300 MG 24 hr tablet Take 300 mg by mouth daily.   Yes Historical Provider, MD  carvedilol (COREG) 12.5 MG tablet Take 1 tablet twice a day 01/15/14  Yes Cameron Sprang, MD  Dextromethorphan-Guaifenesin [facility] DM) 30-600 MG TB12 Take 1  tablet by mouth 2 (two) times daily as needed (for congestion).    Yes Historical Provider, MD  ezetimibe (ZETIA) 10 MG tablet Take 10 mg by mouth daily.   Yes Historical Provider, MD  gabapentin (NEURONTIN) 300 MG capsule Take 1 cap in AM, 1 cap at noon, 2 caps at bedtime Patient taking differently: Take 300-600 mg by mouth 3 (three) times daily. Take 1 cap in AM, 1 cap at noon, 2 caps at bedtime 12/21/14  Yes Cameron Sprang, MD  HYDROcodone-acetaminophen (NORCO/VICODIN) 5-325 MG per tablet Take 1-2 tablets by mouth every 8 (eight) hours as needed. Patient taking differently: Take 1-2 tablets by mouth every 8 (eight) hours as needed for moderate pain.  05/17/15  Yes Colon Branch, MD  levothyroxine (SYNTHROID, LEVOTHROID) 125 MCG tablet  Take 125 mcg by mouth daily before breakfast.   Yes Historical Provider, MD  meclizine (ANTIVERT) 25 MG tablet Take 1 tablet (25 mg total) by mouth at bedtime. 03/30/11  Yes Colon Branch, MD  Propylene Glycol (SYSTANE BALANCE) 0.6 % SOLN Place 1 drop into both eyes 3 (three) times daily as needed (for eyes).    Yes Historical Provider, MD  rivaroxaban (XARELTO) 20 MG TABS tablet Take 1 tablet (20 mg total) by mouth daily with supper. 09/14/14  Yes Colon Branch, MD  triamcinolone cream (KENALOG) 0.1 % Apply 1 application topically 2 (two) times daily. Patient taking differently: Apply 1 application topically 2 (two) times daily as needed (for skin).  03/26/15  Yes Cory Nafziger, NP   BP 188/62 mmHg  Pulse 59  Temp(Src) 98.7 F (37.1 C) (Oral)  Resp 17  SpO2 99% Physical Exam  Constitutional: She is oriented to person, place, and time. She appears well-developed and well-nourished.  HENT:  Head: Normocephalic and atraumatic.  Right Ear: External ear normal.  Left Ear: External ear normal.  Eyes: Conjunctivae and EOM are normal. Pupils are equal, round, and reactive to light.  Neck: Normal range of motion. Neck supple.  Cardiovascular: Normal rate, regular rhythm, normal heart sounds and intact distal pulses.   Pulmonary/Chest: Effort normal and breath sounds normal.  Abdominal: Soft. Bowel sounds are normal. There is no tenderness.  Musculoskeletal: Normal range of motion.  Neurological: She is alert and oriented to person, place, and time. She has normal strength and normal reflexes. No cranial nerve deficit or sensory deficit. Gait (slow, not ataxic) abnormal. Coordination normal.  Skin: Skin is warm and dry.  Vitals reviewed.   ED Course  Procedures (including critical care time) Labs Review Labs Reviewed  BASIC METABOLIC PANEL - Abnormal; Notable for the following:    Glucose, Bld 239 (*)    Creatinine, Ser 1.50 (*)    GFR calc non Af Amer 32 (*)    GFR calc Af Amer 38 (*)    All  other components within normal limits  URINALYSIS, ROUTINE W REFLEX MICROSCOPIC (NOT AT [facility]) - Abnormal; Notable for the following:    Hgb urine dipstick TRACE (*)    Leukocytes, UA TRACE (*)    All other components within normal limits  URINE MICROSCOPIC-ADD ON - Abnormal; Notable for the following:    Bacteria, UA MANY (*)    All other components within normal limits  CBG MONITORING, ED - Abnormal; Notable for the following:    Glucose-Capillary 218 (*)    All other components within normal limits  CBC  I-STAT TROPOININ, ED    Imaging Review Dg Chest 2 View  05/31/2015   CLINICAL DATA:  Short of breath today  EXAM: CHEST  2 VIEW  COMPARISON:  11/17/2014  FINDINGS: Heart is borderline enlarged. Low lung volumes with bibasilar atelectasis. No pneumothorax or pleural effusion. Stable thoracic spine.  IMPRESSION: Bibasilar atelectasis.   Electronically Signed   By: Marybelle Killings M.D.   On: 05/31/2015 16:25   Ct Head Wo Contrast  05/31/2015   CLINICAL DATA:  Weakness in bilateral lower extremities. Difficulty speaking earlier today.  EXAM: CT HEAD WITHOUT CONTRAST  TECHNIQUE: Contiguous axial images were obtained from the base of the skull through the vertex without intravenous contrast.  COMPARISON:  MRI 06/21/2014  FINDINGS: There is atrophy and chronic small vessel disease changes. No acute intracranial abnormality. Specifically, no hemorrhage, hydrocephalus, mass lesion, acute infarction, or significant intracranial injury. No acute calvarial abnormality. Visualized paranasal sinuses and mastoids clear. Orbital soft tissues unremarkable.  IMPRESSION: No acute intracranial abnormality.  Atrophy, chronic microvascular disease.   Electronically Signed   By: Rolm Baptise M.D.   On: 05/31/2015 16:34   I have personally reviewed and evaluated these images and lab results as part of my medical decision-making.   EKG Interpretation   Date/Time:  Friday May 31 2015 14:13:41 EDT Ventricular  Rate:  66 PR Interval:  190 QRS Duration: 92 QT Interval:  423 QTC Calculation: 443 R Axis:   -33 Text Interpretation:  Sinus rhythm Left axis deviation Low voltage,  precordial leads No significant change since last tracing Confirmed by  Debby Freiberg [phone]) on 05/31/2015 2:26:31 PM      MDM   Final diagnoses:  Generalized weakness  UTI (lower urinary tract infection)    79 y.o. female with pertinent PMH of asthma, anxeiety, dm, prior DVT and PE with greenfield presents with generalized weakness beginning yesterday but worsening today.  No fevers, gi symptoms, cough, or other symptoms.  On arrival vitals and physical exam as above, benign.  Pt able to ambulate at her baseline for me, states she feels better.  Wu as above.  Pt care transferred to Dr. Alfonse Spruce pending UA with anticipated dc.    I have reviewed all laboratory and imaging studies if ordered as above  1. Generalized weakness   2. UTI (lower urinary tract infection)         Debby Freiberg, MD 06/01/15 [phone]

## 2015-05-31 NOTE — Telephone Encounter (Signed)
Paperwork received from [facility] this morning. Will complete to best of my ability and forward to Dr. Larose Kells.

## 2015-05-31 NOTE — ED Notes (Signed)
Called [facility], no response. Left voicemail in general mailbox about pt being DC back to facility and instructed to call back.

## 2015-05-31 NOTE — Telephone Encounter (Signed)
Received fax confirmation on 05/31/2015 at 0838.

## 2015-05-31 NOTE — ED Notes (Signed)
Patient comes from [facility] and states she started having some weakness and heaviness to the LE. Patient alert and oriented on arrival. Grips intact bilateral. No nero deficits noted on arrival. Patient states she has Hx of TIA and feels like the same.

## 2015-05-31 NOTE — Telephone Encounter (Signed)
Diabetic test supply form completed and faxed with OV from 05/17/2015 to [facility] at [phone]. Form sent for scanning.

## 2015-05-31 NOTE — Telephone Encounter (Signed)
No, not seen anyting

## 2015-06-01 NOTE — ED Notes (Signed)
Patient accepted in sign out pending UA results.  UA concerning for infection but no sign of pyelonephritis and patient well appearing and able to ambulate with walker.  Patient given dose of Fosfomycin for UTI.  Discussed all results with her and she said she felt well enough for discharge. Patient was discharged back to her assisted living facility with all questions answered.  Strict return precautions given.  Lonia Skinner, MD  Harvel Quale, MD 06/01/15 [phone]

## 2015-06-03 ENCOUNTER — Telehealth: Payer: Self-pay | Admitting: *Deleted

## 2015-06-03 NOTE — Telephone Encounter (Signed)
Home health certification and plan of care received via fax from [facility] for [place] period: 10/15/00 - 07/22/15. Forwarded to Dr. Larose Kells. JG//CMA

## 2015-06-05 NOTE — Telephone Encounter (Signed)
Signed forms faxed successfully to [facility]. Sent for scanning. JG//CMA

## 2015-06-19 ENCOUNTER — Telehealth: Payer: Self-pay | Admitting: *Deleted

## 2015-06-19 NOTE — Telephone Encounter (Signed)
FL2 received via fax from [facility]. Filled out as much as possible and forwarded to Dr. Larose Kells. JG//CMA

## 2015-06-20 NOTE — Telephone Encounter (Signed)
Completed/signed FL2 faxed to [facility] successfully. Sent for scanning. JG//CMA

## 2015-06-26 ENCOUNTER — Telehealth: Payer: Self-pay | Admitting: *Deleted

## 2015-06-26 ENCOUNTER — Telehealth: Payer: Self-pay | Admitting: Internal Medicine

## 2015-06-26 DIAGNOSIS — Z0279 Encounter for issue of other medical certificate: Secondary | ICD-10-CM

## 2015-06-26 NOTE — Telephone Encounter (Signed)
Pt does not have appt scheduled for 06/28/2015, her next appt is 07/09/2015 at 1:15 PM for completion of medical necessity for her oxygen, recommend to keep appt, will look out for paperwork from [place].

## 2015-06-26 NOTE — Telephone Encounter (Signed)
Care plan received via fax from [facility]. Forwarded to Dr. Larose Kells. JG//CMA

## 2015-06-26 NOTE — Telephone Encounter (Signed)
Caller Name: Carmela  Relation to pt: [facility]  Call back number: [phone]    Reason for call:  Ace Gins will be faxing to [phone] or [phone] Home Care Certificate form regarding the patient oxygen. In addition Nanine Means would like to know if patient should keep her appointment for Friday 06/28/15 because she was just seen in September. Please advise

## 2015-06-27 ENCOUNTER — Telehealth: Payer: Self-pay | Admitting: Internal Medicine

## 2015-06-27 NOTE — Telephone Encounter (Signed)
Carmella brought paperwork to be completed for pt. From [facility].

## 2015-06-27 NOTE — Telephone Encounter (Signed)
Informed Dawn Morrow regarding message below

## 2015-06-28 ENCOUNTER — Telehealth: Payer: Self-pay | Admitting: Internal Medicine

## 2015-06-28 ENCOUNTER — Ambulatory Visit: Payer: Medicare Other | Admitting: Internal Medicine

## 2015-06-28 NOTE — Telephone Encounter (Signed)
Care plan faxed to [facility] successfully. JG//CMA

## 2015-06-28 NOTE — Telephone Encounter (Signed)
Ok order for PT

## 2015-06-28 NOTE — Telephone Encounter (Signed)
Please advise, Pt has appt on 07/09/2015 for completion of medical necessity for her oxygen.

## 2015-06-28 NOTE — Telephone Encounter (Signed)
Caller name: [facility]   Can be reached: [phone]    Reason for call: She says that pt is requesting physical therapy because she is experiencing weakness in her legs and decreased joint pain when she walk. She is requesting a verbal order for start of care on Monday.

## 2015-06-28 NOTE — Telephone Encounter (Signed)
Dawn Morrow, her name is not Karna Christmas, verbals given for PT to start on Monday.

## 2015-07-02 NOTE — Telephone Encounter (Signed)
Oxygen forms received from [facility] and forwarded to Dr. Larose Kells. JG//CMA

## 2015-07-02 NOTE — Telephone Encounter (Signed)
Forms are duplicates and have already been faxed. JG//CMA

## 2015-07-03 ENCOUNTER — Ambulatory Visit: Payer: Medicare Other | Admitting: Audiology

## 2015-07-03 ENCOUNTER — Telehealth: Payer: Self-pay | Admitting: Internal Medicine

## 2015-07-03 NOTE — Telephone Encounter (Signed)
Spoke with [employer], verbal orders given.

## 2015-07-03 NOTE — Telephone Encounter (Signed)
Caller name:Snita Relation to pt: PT from [facility] Call back number: [phone]   Reason for call:  Verbal orders for home health treatment 2x 6 1x 1 focus on standing and gait training functioning and fall reduction strategy.

## 2015-07-09 ENCOUNTER — Ambulatory Visit (INDEPENDENT_AMBULATORY_CARE_PROVIDER_SITE_OTHER): Payer: Medicare Other | Admitting: Internal Medicine

## 2015-07-09 ENCOUNTER — Encounter: Payer: Self-pay | Admitting: Internal Medicine

## 2015-07-09 VITALS — BP 126/78 | HR 68 | Temp 97.6°F | Ht 66.0 in | Wt 230.1 lb

## 2015-07-09 DIAGNOSIS — Z23 Encounter for immunization: Secondary | ICD-10-CM | POA: Diagnosis not present

## 2015-07-09 DIAGNOSIS — N19 Unspecified kidney failure: Secondary | ICD-10-CM | POA: Diagnosis not present

## 2015-07-09 DIAGNOSIS — J449 Chronic obstructive pulmonary disease, unspecified: Secondary | ICD-10-CM | POA: Diagnosis not present

## 2015-07-09 DIAGNOSIS — Z09 Encounter for follow-up examination after completed treatment for conditions other than malignant neoplasm: Secondary | ICD-10-CM

## 2015-07-09 DIAGNOSIS — E119 Type 2 diabetes mellitus without complications: Secondary | ICD-10-CM

## 2015-07-09 NOTE — Progress Notes (Signed)
SATURATION QUALIFICATIONS: (This note is used to comply with regulatory documentation for home oxygen)  Patient Saturations on Room Air at Rest = 95%  Patient Saturations on Room Air while Ambulating = 84%  Patient Saturations on 2 Liters of oxygen while Ambulating = 92%  Please briefly explain why patient needs home oxygen: Asthma, COPD

## 2015-07-09 NOTE — Progress Notes (Signed)
Subjective:    Patient ID: Dawn Morrow, female    DOB: 30-Dec-1935, 79 y.o.   MRN: [id number]  DOS:  07/09/2015 Type of visit - description : Routine checkup Interval history: DM: Started Tradjenta based on last A1c COPD: Seems to be doing well, uses oxygen without apparent problems. C/o constipation for the last few weeks, Tylenol PM daily but now is not as frequent, patient could not tell me more details.   Review of Systems  Denies fever chills Denies nausea, vomiting, diarrhea. No blood in the stools. No abdominal pain.  Past Medical History  Diagnosis Date  . Asthma   . COPD (chronic obstructive pulmonary disease) ([place])   . DVT (deep vein thrombosis) in pregnancy     hx x multiple on coumadin  . Pulmonary embolism ([facility])     x 2  . GERD (gastroesophageal reflux disease)   . Hypertension   . Osteopenia   . Hypothyroidism   . Hyperlipidemia   . Osteoarthritis     h/o spinal stenosis by MRI 2009  . Hiatal hernia   . Diverticulosis   . Hemorrhoids   . Tubular adenoma of colon 07/2005  . Anxiety and depression   . Cataract   . Stroke [facility])     MINI  . Diabetes mellitus     Past Surgical History  Procedure Laterality Date  . Appendectomy    . Cholecystectomy    . Tubal ligation    . Tonsillectomy    . Lumbar fusion    . Cataract extraction    . Colonoscopy    . Polypectomy      Social History   Social History  . Marital Status: Divorced    Spouse Name: N/A  . Number of Children: 3  . Years of Education: N/A   Occupational History  . retired    Social History Main Topics  . Smoking status: Former Smoker    Types: Cigarettes  . Smokeless tobacco: Never Used  . Alcohol Use: No     Comment: socially  . Drug Use: No  . Sexual Activity: Not on file   Other Topics Concern  . Not on file   Social History Narrative   Single,   lost her sister in 2012. 3 children , all live in [place] to [place] late 10-2013   [employer], lives in  [place]; nice [place] in [place] [phone], [phone])   Uses a diaper.     Doesn't drive          Medication List       This list is accurate as of: 07/09/15  1:17 PM.  Always use your most recent med list.               albuterol 108 (90 BASE) MCG/ACT inhaler  Commonly known as:  VENTOLIN HFA  Inhale 2 puffs into the lungs every 4 (four) hours as needed.     aspirin-acetaminophen-caffeine 250-250-65 MG tablet  Commonly known as:  EXCEDRIN MIGRAINE  Take 2 tablets by mouth every 6 (six) hours as needed for headache (headache).     atorvastatin 10 MG tablet  Commonly known as:  LIPITOR  Take 1 tablet (10 mg total) by mouth at bedtime.     buPROPion 300 MG 24 hr tablet  Commonly known as:  WELLBUTRIN XL  Take 300 mg by mouth daily.     carvedilol 12.5 MG tablet  Commonly known as:  COREG  Take 1 tablet twice a day     ezetimibe 10 MG tablet  Commonly known as:  ZETIA  Take 10 mg by mouth daily.     gabapentin 300 MG capsule  Commonly known as:  NEURONTIN  Take 1 cap in AM, 1 cap at noon, 2 caps at bedtime     HYDROcodone-acetaminophen 5-325 MG tablet  Commonly known as:  NORCO/VICODIN  Take 1-2 tablets by mouth every 8 (eight) hours as needed.     levothyroxine 125 MCG tablet  Commonly known as:  SYNTHROID, LEVOTHROID  Take 125 mcg by mouth daily before breakfast.     meclizine 25 MG tablet  Commonly known as:  ANTIVERT  Take 1 tablet (25 mg total) by mouth at bedtime.     MUCINEX DM 30-600 MG Tb12  Take 1 tablet by mouth 2 (two) times daily as needed (for congestion).     rivaroxaban 20 MG Tabs tablet  Commonly known as:  XARELTO  Take 1 tablet (20 mg total) by mouth daily with supper.     SYSTANE BALANCE 0.6 % Soln  Generic drug:  Propylene Glycol  Place 1 drop into both eyes 3 (three) times daily as needed (for eyes).     triamcinolone cream 0.1 %  Commonly known as:  KENALOG  Apply 1 application topically 2 (two) times daily.             Objective:   Physical Exam BP 126/78 mmHg  Pulse 68  Temp(Src) 97.6 F (36.4 C) (Oral)  Ht 5\' 6"  (1.676 m)  Wt 230 lb 2 oz (104.384 kg)  BMI 37.16 kg/m2  SpO2 95% General:   Well developed, well nourished . NAD.  HEENT:  Normocephalic . Face symmetric, atraumatic Lungs:  Lightly decreased breath sounds but otherwise clear, on oxygen supplementation Normal respiratory effort, no intercostal retractions, no accessory muscle use. Heart: RRR,  no murmur.  Trace pretibial edema bilaterally abdomen: Nondistended, soft, not tender Skin: Not pale. Not jaundice Neurologic:  alert & oriented X3.  Speech normal, gait  assisted by a walker  Psych--    Behavior appropriate. No anxious or depressed appearing.      Assessment & Plan:   Problem list > Diabetes Hypertension Hypothyroidism Hyperlipidemia Chronic renal insufficiency creatinine ~1.3, 1.5 Hematology: on xarelto --Multiple DVTs --PE 2 Anxiety and depression COPD asthma,home  O2 Neuro: - -h/o  stroke --Tremor  GI: --GERD, diverticulosis, HH, hemorrhoids, MSK: --Osteoporosis, normal vitamin D  --DJD --Spinal stenosis, MRI 2009  Plan: Diabetes: Started tradjenta a few weeks ago, we'll recheck labs when she comes back in 3 months COPD: Oxygen dependent.  O2 sat at rest 92 % w/ O2, it dropped to 84% w/o O2   After a short walk. Constipation : Having less frequent bowel movements lately, patient has a difficult time giving more details but review of systems negative. Started Tradjenta few weeks ago, side effect?. Recommend MiraLAX,  reassess in few months. Primary care: Flu shot provided RTC 10-09-2015 as scheduled

## 2015-07-09 NOTE — Progress Notes (Signed)
Pre visit review using our clinic review tool, if applicable. No additional management support is needed unless otherwise documented below in the visit note. 

## 2015-07-09 NOTE — Patient Instructions (Addendum)
MiraLAX 17 g daily mixed with fluids for 2 weeks, then as needed for constipation

## 2015-07-09 NOTE — Telephone Encounter (Signed)
Late entry. Forms were faxed to [place] on 07/03/15

## 2015-07-10 ENCOUNTER — Telehealth: Payer: Self-pay

## 2015-07-10 NOTE — Telephone Encounter (Signed)
Physical therapist Emogene Morgan called  from [facility] to get  order to check patient o2 stats during physicial therapy also wants to conform Mode of supplements are continuous [phone] Please call to confirm

## 2015-07-10 NOTE — Telephone Encounter (Signed)
Tried calling Smita back at [phone], informed I had wrong number.

## 2015-07-10 NOTE — Assessment & Plan Note (Signed)
Diabetes: Started tradjenta a few weeks ago, we'll recheck labs when she comes back in 3 months COPD: Oxygen dependent.  O2 sat at rest 92 % w/ O2, it dropped to 84% w/o O2   After a short walk. Constipation : Having less frequent bowel movements lately, patient has a difficult time giving more details but review of systems negative. Started Tradjenta few weeks ago, side effect?. Recommend MiraLAX,  reassess in few months. Primary care: Flu shot provided RTC 10-09-2015 as scheduled

## 2015-07-12 ENCOUNTER — Telehealth: Payer: Self-pay | Admitting: Internal Medicine

## 2015-07-12 NOTE — Telephone Encounter (Signed)
Received fax confirmation on 07/12/2015 at 4:39 PM.

## 2015-07-12 NOTE — Telephone Encounter (Signed)
Caller name: Baxter Flattery  Relation to pt: [facility]  Call back number: [phone]    Reason for call:  Requesting office notes from 07/09/15 appointment please fax

## 2015-07-12 NOTE — Telephone Encounter (Signed)
OV notes from 07/09/2015 printed, and faxed to [phone].

## 2015-07-16 ENCOUNTER — Telehealth: Payer: Self-pay | Admitting: Internal Medicine

## 2015-07-16 NOTE — Telephone Encounter (Signed)
Duplicate telephone note, see telephone note from 07/10/2015.

## 2015-07-16 NOTE — Telephone Encounter (Signed)
Oneta Rack at 07/16/2015 9:58 AM     Status: Signed       Expand All Collapse All   Caller name: Snita  Relation to pt: PT from [facility]  Call back number: [phone]   Reason for call:  Requesting verbal orders to check O2 Stat

## 2015-07-16 NOTE — Telephone Encounter (Signed)
Caller name: Snita  Relation to pt: PT from [facility]  Call back number: [phone]   Reason for call:  Requesting verbal orders to check O2 Stat

## 2015-07-16 NOTE — Telephone Encounter (Signed)
Spoke with Coco, verbal orders given.

## 2015-07-17 ENCOUNTER — Telehealth: Payer: Self-pay | Admitting: *Deleted

## 2015-07-17 NOTE — Telephone Encounter (Signed)
Forwarded to Dr. Larose Kells for review/signature. JG//CMA

## 2015-07-18 DIAGNOSIS — I10 Essential (primary) hypertension: Secondary | ICD-10-CM | POA: Diagnosis not present

## 2015-07-18 DIAGNOSIS — E1165 Type 2 diabetes mellitus with hyperglycemia: Secondary | ICD-10-CM | POA: Diagnosis not present

## 2015-07-18 DIAGNOSIS — M4800 Spinal stenosis, site unspecified: Secondary | ICD-10-CM | POA: Diagnosis not present

## 2015-07-18 DIAGNOSIS — M1990 Unspecified osteoarthritis, unspecified site: Secondary | ICD-10-CM | POA: Diagnosis not present

## 2015-07-24 NOTE — Telephone Encounter (Signed)
Forms faxed to [facility] successfully. Sent for scanning. JG//CMA

## 2015-08-05 ENCOUNTER — Ambulatory Visit (INDEPENDENT_AMBULATORY_CARE_PROVIDER_SITE_OTHER): Payer: Medicare Other | Admitting: Medical

## 2015-08-05 ENCOUNTER — Encounter: Payer: Self-pay | Admitting: Medical

## 2015-08-05 ENCOUNTER — Ambulatory Visit (HOSPITAL_BASED_OUTPATIENT_CLINIC_OR_DEPARTMENT_OTHER)
Admission: RE | Admit: 2015-08-05 | Discharge: 2015-08-05 | Disposition: A | Discharge status: Home / Self Care | Payer: Medicare Other | Source: Ambulatory Visit | Attending: Medical | Admitting: Medical

## 2015-08-05 ENCOUNTER — Ambulatory Visit (HOSPITAL_BASED_OUTPATIENT_CLINIC_OR_DEPARTMENT_OTHER)
Admission: RE | Admit: 2015-08-05 | Discharge: 2015-08-05 | Disposition: A | Discharge status: Home / Self Care | Payer: Medicare Other | Source: Ambulatory Visit | Attending: Internal Medicine | Admitting: Internal Medicine

## 2015-08-05 VITALS — BP 126/78 | HR 68 | Temp 97.8°F | Ht 66.0 in | Wt 226.0 lb

## 2015-08-05 DIAGNOSIS — J209 Acute bronchitis, unspecified: Secondary | ICD-10-CM

## 2015-08-05 DIAGNOSIS — R05 Cough: Secondary | ICD-10-CM | POA: Insufficient documentation

## 2015-08-05 DIAGNOSIS — M810 Age-related osteoporosis without current pathological fracture: Secondary | ICD-10-CM

## 2015-08-05 DIAGNOSIS — Z780 Asymptomatic menopausal state: Secondary | ICD-10-CM | POA: Insufficient documentation

## 2015-08-05 DIAGNOSIS — R062 Wheezing: Secondary | ICD-10-CM

## 2015-08-05 DIAGNOSIS — E039 Hypothyroidism, unspecified: Secondary | ICD-10-CM | POA: Diagnosis not present

## 2015-08-05 LAB — CBC WITH DIFFERENTIAL/PLATELET
Basophils Absolute: 0.1 10*3/uL (ref 0.0–0.1)
Basophils Relative: 0.5 % (ref 0.0–3.0)
Eosinophils Absolute: 0.4 10*3/uL (ref 0.0–0.7)
Eosinophils Relative: 3.6 % (ref 0.0–5.0)
HCT: 41.5 % (ref 36.0–46.0)
Hemoglobin: 13.2 g/dL (ref 12.0–15.0)
Lymphocytes Relative: 19.5 % (ref 12.0–46.0)
Lymphs Abs: 1.9 10*3/uL (ref 0.7–4.0)
MCHC: 31.8 g/dL (ref 30.0–36.0)
MCV: 92.2 fl (ref 78.0–100.0)
Monocytes Absolute: 0.6 10*3/uL (ref 0.1–1.0)
Monocytes Relative: 6.1 % (ref 3.0–12.0)
Neutro Abs: 7 10*3/uL (ref 1.4–7.7)
Neutrophils Relative %: 70.3 % (ref 43.0–77.0)
Platelets: 200 10*3/uL (ref 150.0–400.0)
RBC: 4.5 Mil/uL (ref 3.87–5.11)
RDW: 16.1 % — ABNORMAL HIGH (ref 11.5–15.5)
WBC: 9.9 10*3/uL (ref 4.0–10.5)

## 2015-08-05 MED ORDER — AZITHROMYCIN 250 MG PO TABS: ORAL_TABLET | ORAL | Status: DC

## 2015-08-05 MED ORDER — BENZONATATE 100 MG PO CAPS: 100.0000 mg | ORAL_CAPSULE | Freq: Three times a day (TID) | ORAL | Status: DC | PRN

## 2015-08-05 MED ORDER — ALBUTEROL SULFATE HFA 108 (90 BASE) MCG/ACT IN AERS: 2.0000 | INHALATION_SPRAY | Freq: Four times a day (QID) | RESPIRATORY_TRACT | Status: DC | PRN

## 2015-08-05 MED ORDER — ALBUTEROL SULFATE HFA 108 (90 BASE) MCG/ACT IN AERS: 2.0000 | INHALATION_SPRAY | Freq: Four times a day (QID) | RESPIRATORY_TRACT | Status: AC | PRN

## 2015-08-05 NOTE — Progress Notes (Signed)
Pre visit review using our clinic review tool, if applicable. No additional management support is needed unless otherwise documented below in the visit note. 

## 2015-08-05 NOTE — Progress Notes (Signed)
Subjective:    Patient ID: Dawn Morrow, female    DOB: [birth year], 79 y.o.   MRN: [id number]  HPI  Pt states since past Thursday has had chest congestion. Some rattling sound at times when coughing. Some productive cough. Pt has history of copd.(Alot second hand smoke exposure throughout her life.) Some chills but no fever. Pt has been fatigued.  Some wheezing this am. But can't remember any this weekend.   Review of Systems  Constitutional: Positive for fatigue. Negative for fever and chills.  HENT: Positive for congestion. Negative for facial swelling and postnasal drip.   Respiratory: Positive for cough. Negative for chest tightness, shortness of breath and wheezing.        Mild productive cough.  Cardiovascular: Negative for chest pain and palpitations.  Gastrointestinal: Negative for abdominal pain.  Musculoskeletal: Negative for back pain.  Neurological: Negative for dizziness, syncope, weakness and light-headedness.  Hematological: Negative for adenopathy. Does not bruise/bleed easily.    Past Medical History  Diagnosis Date  . Asthma   . COPD (chronic obstructive pulmonary disease) ([place])   . DVT (deep vein thrombosis) in pregnancy     hx x multiple on coumadin  . Pulmonary embolism (HCC)     x 2  . GERD (gastroesophageal reflux disease)   . Hypertension   . Osteopenia   . Hypothyroidism   . Hyperlipidemia   . Osteoarthritis     h/o spinal stenosis by MRI 2009  . Hiatal hernia   . Diverticulosis   . Hemorrhoids   . Tubular adenoma of colon 07/2005  . Anxiety and depression   . Cataract   . Stroke [facility])     MINI  . Diabetes mellitus     Social History   Social History  . Marital Status: Divorced    Spouse Name: N/A  . Number of Children: 3  . Years of Education: N/A   Occupational History  . retired    Social History Main Topics  . Smoking status: Former Smoker    Types: Cigarettes  . Smokeless tobacco: Never Used  . Alcohol Use: No   Comment: socially  . Drug Use: No  . Sexual Activity: Not on file   Other Topics Concern  . Not on file   Social History Narrative   Single,   lost her sister in 2012. 3 children , all live in [place] to [place] late 10-2013   [employer], lives in [place]; nice [place] in [place] [phone], [phone])   Uses a diaper.     Doesn't drive      Past Surgical History  Procedure Laterality Date  . Appendectomy    . Cholecystectomy    . Tubal ligation    . Tonsillectomy    . Lumbar fusion    . Cataract extraction    . Colonoscopy    . Polypectomy      Family History  Problem Relation Age of Onset  . Adopted: Yes  . Cancer Mother     ? colon or ovarian  . Diabetes Mother   . Cancer Brother     ?  . CAD Neg Hx     Allergies  Allergen Reactions  . Penicillins Anaphylaxis  . Codeine Other (See Comments)    unknown  . Sulfonamide Derivatives Other (See Comments)    unknown    Current Outpatient Prescriptions on File Prior to Visit  Medication Sig Dispense Refill  . albuterol (VENTOLIN  HFA) 108 (90 BASE) MCG/ACT inhaler Inhale 2 puffs into the lungs every 4 (four) hours as needed. 1 Inhaler 6  . aspirin-acetaminophen-caffeine (EXCEDRIN MIGRAINE) [id number] MG per tablet Take 2 tablets by mouth every 6 (six) hours as needed for headache (headache).     Marland Kitchen atorvastatin (LIPITOR) 10 MG tablet Take 1 tablet (10 mg total) by mouth at bedtime. 30 tablet 3  . buPROPion (WELLBUTRIN XL) 300 MG 24 hr tablet Take 300 mg by mouth daily.    . carvedilol (COREG) 12.5 MG tablet Take 1 tablet twice a day 180 tablet 3  . Dextromethorphan-Guaifenesin (MUCINEX DM) 30-600 MG TB12 Take 1 tablet by mouth 2 (two) times daily as needed (for congestion).     . ezetimibe (ZETIA) 10 MG tablet Take 10 mg by mouth daily.    Marland Kitchen gabapentin (NEURONTIN) 300 MG capsule Take 1 cap in AM, 1 cap at noon, 2 caps at bedtime (Patient taking differently: Take 300-600 mg by mouth 3 (three) times  daily. Take 1 cap in AM, 1 cap at noon, 2 caps at bedtime) 120 capsule 6  . HYDROcodone-acetaminophen (NORCO/VICODIN) 5-325 MG per tablet Take 1-2 tablets by mouth every 8 (eight) hours as needed. 30 tablet 0  . levothyroxine (SYNTHROID, LEVOTHROID) 125 MCG tablet Take 125 mcg by mouth daily before breakfast.    . linagliptin (TRADJENTA) 5 MG TABS tablet Take 5 mg by mouth daily.    . meclizine (ANTIVERT) 25 MG tablet Take 1 tablet (25 mg total) by mouth at bedtime. 30 tablet 6  . Propylene Glycol (SYSTANE BALANCE) 0.6 % SOLN Place 1 drop into both eyes 3 (three) times daily as needed (for eyes).     . rivaroxaban (XARELTO) 20 MG TABS tablet Take 1 tablet (20 mg total) by mouth daily with supper. 30 tablet 3  . triamcinolone cream (KENALOG) 0.1 % Apply 1 application topically 2 (two) times daily. 30 g 2   Current Facility-Administered Medications on File Prior to Visit  Medication Dose Route Frequency Provider Last Rate Last Dose  . zoledronic acid (RECLAST) injection 5 mg  5 mg Intravenous Once Colon Branch, MD        BP 126/78 mmHg  Pulse 68  Temp(Src) 97.8 F (36.6 C) (Oral)  Ht 5\' 6"  (1.676 m)  Wt 226 lb (102.513 kg)  BMI 36.49 kg/m2  SpO2 97%       Objective:   Physical Exam  General  Mental Status - Alert. General Appearance - Well groomed. Not in acute distress.  Skin Rashes- No Rashes.  HEENT Head- Normal. Ear Auditory Canal - Left- Normal. Right - Normal.Tympanic Membrane- Left- Normal. Right- Normal. Eye Sclera/Conjunctiva- Left- Normal. Right- Normal. Nose & Sinuses Nasal Mucosa- Left-  Not boggy or Congested. Right-  Not  boggy or Congested. Mouth & Throat Lips: Upper Lip- Normal: no dryness, cracking, pallor, cyanosis, or vesicular eruption. Lower Lip-Normal: no dryness, cracking, pallor, cyanosis or vesicular eruption. Buccal Mucosa- Bilateral- No Aphthous ulcers. Oropharynx- No Discharge or Erythema. Tonsils: Characteristics- Bilateral- No Erythema or  Congestion. Size/Enlargement- Bilateral- No enlargement. Discharge- bilateral-None.  Neck Neck- Supple. No Masses.   Chest and Lung Exam Auscultation: Breath Sounds:- even and unlabored. Mild shallow.  Cardiovascular Auscultation:Rythm- Regular, rate and rhythm. Murmurs & Other Heart Sounds:Ausculatation of the heart reveal- No Murmurs.  Lymphatic Head & Neck General Head & Neck Lymphatics: Bilateral: Description- No Localized lymphadenopathy.       Assessment & Plan:  For recent bronchitis. Will rx azithromycin  antibiotic. And Benzonatate for cough.  For your wheezing will rx albuterol inhaler. Use if needed and continue your oxygen.  Will go ahead and get cbc and cxr today.(evaluate possibility of pneumonia).  Follow up in 7 days or as needed

## 2015-08-05 NOTE — Patient Instructions (Signed)
For recent bronchitis. Will rx azithromycin antibiotic. And Benzonatate for cough.  For your wheezing will rx albuterol inhaler. Use if needed and continue your oxygen.  Will go ahead and get cbc and cxr today.(evaluate possibility of pneumonia).  Follow up in 7 days or as needed

## 2015-08-06 ENCOUNTER — Telehealth: Payer: Self-pay | Admitting: Internal Medicine

## 2015-08-06 NOTE — Telephone Encounter (Signed)
Caller name: Morey Hummingbird - Nurse with [facility]  Relationship to patient: nurse   Can be reached: [phone]  Reason for call: She needs verbal orders for nurse eval for pt.

## 2015-08-06 NOTE — Telephone Encounter (Signed)
Spoke with Morey Hummingbird, verbal orders given.

## 2015-08-07 ENCOUNTER — Telehealth: Payer: Self-pay | Admitting: Internal Medicine

## 2015-08-07 NOTE — Telephone Encounter (Signed)
Prolia verification started online, awaiting insurance response. Insurance verification form sent for scanning.

## 2015-08-07 NOTE — Telephone Encounter (Signed)
T score - history 0.6. Chart reviewed: Dawn Morrow for 5 years (compliance?) d/c 2011 In the past cost of prolia and Reclast   has been an issue. Current creatinine 1.5, borderline for bisphosphonate use  Plan:  arrange for prolia and let pt know. If unable to get prolia , will reconsider Fosamax if swallowing is okay.

## 2015-08-07 NOTE — Telephone Encounter (Signed)
Caller name: Morey Hummingbird from [facility]  Relation to pt: RN Call back number: [phone]  Reason for call:  Requesting verbal orders frequency 2x 3 1x 1 for medication effectiveness, assessment of pulmonary bronchitis management & skilled teaching

## 2015-08-07 NOTE — Telephone Encounter (Signed)
Spoke with Morey Hummingbird, RN, verbal orders given.

## 2015-08-12 ENCOUNTER — Ambulatory Visit: Payer: Medicare Other | Admitting: Audiology

## 2015-08-12 ENCOUNTER — Telehealth: Payer: Self-pay | Admitting: Internal Medicine

## 2015-08-12 NOTE — Telephone Encounter (Signed)
Caller name: Posey Pronto  Relation to pt: PT from [facility]  Call back number: [phone]  Pharmacy:  Reason for call:  Requesting verbal orders for knee extension 3 more visit

## 2015-08-13 ENCOUNTER — Ambulatory Visit (INDEPENDENT_AMBULATORY_CARE_PROVIDER_SITE_OTHER): Payer: Medicare Other | Admitting: Internal Medicine

## 2015-08-13 ENCOUNTER — Encounter: Payer: Self-pay | Admitting: Internal Medicine

## 2015-08-13 VITALS — BP 108/64 | HR 65 | Temp 98.6°F | Ht 66.0 in | Wt 227.1 lb

## 2015-08-13 DIAGNOSIS — E1159 Type 2 diabetes mellitus with other circulatory complications: Secondary | ICD-10-CM

## 2015-08-13 DIAGNOSIS — Z09 Encounter for follow-up examination after completed treatment for conditions other than malignant neoplasm: Secondary | ICD-10-CM

## 2015-08-13 DIAGNOSIS — J209 Acute bronchitis, unspecified: Secondary | ICD-10-CM | POA: Diagnosis not present

## 2015-08-13 LAB — HEMOGLOBIN A1C: Hgb A1c MFr Bld: 7.7 % — ABNORMAL HIGH (ref 4.6–6.5)

## 2015-08-13 NOTE — Progress Notes (Signed)
Subjective:    Patient ID: Dawn Morrow, female    DOB: 23-Nov-1935, 79 y.o.   MRN: [id number]  DOS:  08/13/2015 Type of visit - description : Follow-up Interval history: Was seen last week with cough, wheezing. DX bronchitis, CBC and chest x-ray satisfactory. Got a Z-Pak. Now feeling better.  Review of Systems No  fever chills No nausea, vomiting, diarrhea. Cough essentially gone, no sputum production  Past Medical History  Diagnosis Date  . Asthma   . COPD (chronic obstructive pulmonary disease) ([place])   . DVT (deep vein thrombosis) in pregnancy     hx x multiple on coumadin  . Pulmonary embolism (HCC)     x 2  . GERD (gastroesophageal reflux disease)   . Hypertension   . Osteopenia   . Hypothyroidism   . Hyperlipidemia   . Osteoarthritis     h/o spinal stenosis by MRI 2009  . Hiatal hernia   . Diverticulosis   . Hemorrhoids   . Tubular adenoma of colon 07/2005  . Anxiety and depression   . Cataract   . Stroke [facility])     MINI  . Diabetes mellitus     Past Surgical History  Procedure Laterality Date  . Appendectomy    . Cholecystectomy    . Tubal ligation    . Tonsillectomy    . Lumbar fusion    . Cataract extraction    . Colonoscopy    . Polypectomy      Social History   Social History  . Marital Status: Divorced    Spouse Name: N/A  . Number of Children: 3  . Years of Education: N/A   Occupational History  . retired    Social History Main Topics  . Smoking status: Former Smoker    Types: Cigarettes  . Smokeless tobacco: Never Used  . Alcohol Use: No     Comment: socially  . Drug Use: No  . Sexual Activity: Not on file   Other Topics Concern  . Not on file   Social History Narrative   Single,   lost her sister in 2012. 3 children , all live in [place] to [place] late 10-2013   [employer], lives in [place]; nice [place] in [place] [phone], [phone])   Uses a diaper.     Doesn't drive          Medication List       This list is accurate as of: 08/13/15  6:08 PM.  Always use your most recent med list.               albuterol 108 (90 BASE) MCG/ACT inhaler  Commonly known as:  PROVENTIL HFA;VENTOLIN HFA  Inhale 2 puffs into the lungs every 6 (six) hours as needed for wheezing or shortness of breath.     aspirin-acetaminophen-caffeine 250-250-65 MG tablet  Commonly known as:  EXCEDRIN MIGRAINE  Take 2 tablets by mouth every 6 (six) hours as needed for headache (headache).     atorvastatin 10 MG tablet  Commonly known as:  LIPITOR  Take 1 tablet (10 mg total) by mouth at bedtime.     benzonatate 100 MG capsule  Commonly known as:  TESSALON  Take 1 capsule (100 mg total) by mouth 3 (three) times daily as needed.     buPROPion 300 MG 24 hr tablet  Commonly known as:  WELLBUTRIN XL  Take 300 mg by mouth daily.  carvedilol 12.5 MG tablet  Commonly known as:  COREG  Take 1 tablet twice a day     ezetimibe 10 MG tablet  Commonly known as:  ZETIA  Take 10 mg by mouth daily.     gabapentin 300 MG capsule  Commonly known as:  NEURONTIN  Take 1 cap in AM, 1 cap at noon, 2 caps at bedtime     HYDROcodone-acetaminophen 5-325 MG tablet  Commonly known as:  NORCO/VICODIN  Take 1-2 tablets by mouth every 8 (eight) hours as needed.     levothyroxine 125 MCG tablet  Commonly known as:  SYNTHROID, LEVOTHROID  Take 125 mcg by mouth daily before breakfast.     meclizine 25 MG tablet  Commonly known as:  ANTIVERT  Take 1 tablet (25 mg total) by mouth at bedtime.     MUCINEX DM 30-600 MG Tb12  Take 1 tablet by mouth 2 (two) times daily as needed (for congestion).     rivaroxaban 20 MG Tabs tablet  Commonly known as:  XARELTO  Take 1 tablet (20 mg total) by mouth daily with supper.     SYSTANE BALANCE 0.6 % Soln  Generic drug:  Propylene Glycol  Place 1 drop into both eyes 3 (three) times daily as needed (for eyes).     TRADJENTA 5 MG Tabs tablet  Generic drug:  linagliptin  Take 5 mg by  mouth daily.     triamcinolone cream 0.1 %  Commonly known as:  KENALOG  Apply 1 application topically 2 (two) times daily.           Objective:   Physical Exam BP 108/64 mmHg  Pulse 65  Temp(Src) 98.6 F (37 C) (Oral)  Ht 5\' 6"  (1.676 m)  Wt 227 lb 2 oz (103.023 kg)  BMI 36.68 kg/m2  SpO2 97% General:   Well developed, well nourished . NAD.  HEENT:  Normocephalic . Face symmetric, atraumatic Lungs:  Decreased breath sounds Normal respiratory effort, no intercostal retractions, no accessory muscle use. Heart: RRR,  no murmur.  No pretibial edema bilaterally  Skin: Not pale. Not jaundice Neurologic:  alert & oriented X3.  Speech normal, gait at baseline  Psych--  No anxious or depressed appearing.      Assessment & Plan:    Problem list > Diabetes Hypertension Hypothyroidism Hyperlipidemia Chronic renal insufficiency creatinine ~1.3, 1.5 Hematology: on xarelto --Multiple DVTs --PE 2 Anxiety and depression COPD asthma,home  O2 Neuro: --h/o  stroke --Tremor  GI: --GERD, diverticulosis, HH, hemorrhoids, MSK: --Osteoporosis, normal vitamin D . T score 07-2015: -3.6, Rx Prolia  --DJD --Spinal stenosis, MRI 2009  Plan: Bronchitis: Back to baseline after a Z-Pak. Chest x-ray and CBC were normal. DM: Started Tradjenta base on last A1c, check A1c RTC 10-2014

## 2015-08-13 NOTE — Patient Instructions (Addendum)
Get your blood work before you leave      See you in February

## 2015-08-13 NOTE — Telephone Encounter (Signed)
Verbal orders given  

## 2015-08-13 NOTE — Progress Notes (Signed)
Pre visit review using our clinic review tool, if applicable. No additional management support is needed unless otherwise documented below in the visit note. 

## 2015-08-13 NOTE — Assessment & Plan Note (Signed)
Bronchitis: Back to baseline after a Z-Pak. Chest x-ray and CBC were normal. DM: Started Tradjenta base on last A1c, check A1c RTC 10-2014

## 2015-08-14 NOTE — Telephone Encounter (Signed)
Pt agrees to Prolia at next OV on 10/09/2015.  Tricia-can I get a Prolia ordered for Pt? Thank you.

## 2015-08-14 NOTE — Telephone Encounter (Signed)
Pt informed of bone density results at routine follow-up on 08/13/2015. Pt agrees to start Prolia. Received Prolia summary of benefits: Benefits subject to a $166 deductible ($166 met) and 20 % co-insurance for the administration and cost of Prolia. Summary of benefits sent for scanning.

## 2015-08-16 NOTE — Telephone Encounter (Signed)
Noted  

## 2015-08-16 NOTE — Telephone Encounter (Signed)
Med is here and in the refrigerator

## 2015-09-12 ENCOUNTER — Telehealth: Payer: Self-pay | Admitting: Internal Medicine

## 2015-09-12 MED ORDER — AZITHROMYCIN 250 MG PO TABS: ORAL_TABLET | ORAL | Status: DC

## 2015-09-12 MED ORDER — PREDNISONE 20 MG PO TABS: 20.0000 mg | ORAL_TABLET | Freq: Every day | ORAL | Status: DC

## 2015-09-12 NOTE — Telephone Encounter (Signed)
Prednisone 20 mg and Z-pak prescription printed. Recommendations faxed to [facility] at [phone] w/ prescriptions.

## 2015-09-12 NOTE — Telephone Encounter (Signed)
Received fax from [place], the patient is not feeling well, has congestion, green mucus, wheezing, not responding to prn medications. She has diabetes, COPD, asthma, home oxygen. Plan: If she has severe symptoms  go to the ER. Call in a Z-Pak Prednisone 20 mg one by mouth daily 5 days Arrange a visit for next week if she is not gradually improving Continue inhalers, if needed we could start her on albuterol nebs.

## 2015-09-20 ENCOUNTER — Emergency Department (HOSPITAL_COMMUNITY): Payer: Medicare Other

## 2015-09-20 ENCOUNTER — Encounter (HOSPITAL_COMMUNITY): Payer: Self-pay

## 2015-09-20 ENCOUNTER — Telehealth: Payer: Self-pay | Admitting: *Deleted

## 2015-09-20 ENCOUNTER — Emergency Department (HOSPITAL_COMMUNITY)
Admission: EM | Admit: 2015-09-20 | Discharge: 2015-09-21 | Disposition: A | Discharge status: Home / Self Care | Payer: Medicare Other | Attending: Emergency Medicine | Admitting: Emergency Medicine

## 2015-09-20 DIAGNOSIS — Z8673 Personal history of transient ischemic attack (TIA), and cerebral infarction without residual deficits: Secondary | ICD-10-CM | POA: Insufficient documentation

## 2015-09-20 DIAGNOSIS — Z8719 Personal history of other diseases of the digestive system: Secondary | ICD-10-CM | POA: Diagnosis not present

## 2015-09-20 DIAGNOSIS — Z86711 Personal history of pulmonary embolism: Secondary | ICD-10-CM | POA: Insufficient documentation

## 2015-09-20 DIAGNOSIS — E039 Hypothyroidism, unspecified: Secondary | ICD-10-CM | POA: Insufficient documentation

## 2015-09-20 DIAGNOSIS — M1990 Unspecified osteoarthritis, unspecified site: Secondary | ICD-10-CM | POA: Diagnosis not present

## 2015-09-20 DIAGNOSIS — Z85038 Personal history of other malignant neoplasm of large intestine: Secondary | ICD-10-CM | POA: Insufficient documentation

## 2015-09-20 DIAGNOSIS — F329 Major depressive disorder, single episode, unspecified: Secondary | ICD-10-CM | POA: Insufficient documentation

## 2015-09-20 DIAGNOSIS — E785 Hyperlipidemia, unspecified: Secondary | ICD-10-CM | POA: Insufficient documentation

## 2015-09-20 DIAGNOSIS — Z86718 Personal history of other venous thrombosis and embolism: Secondary | ICD-10-CM | POA: Diagnosis not present

## 2015-09-20 DIAGNOSIS — Z9849 Cataract extraction status, unspecified eye: Secondary | ICD-10-CM | POA: Diagnosis not present

## 2015-09-20 DIAGNOSIS — F419 Anxiety disorder, unspecified: Secondary | ICD-10-CM | POA: Insufficient documentation

## 2015-09-20 DIAGNOSIS — E119 Type 2 diabetes mellitus without complications: Secondary | ICD-10-CM | POA: Diagnosis not present

## 2015-09-20 DIAGNOSIS — Z79899 Other long term (current) drug therapy: Secondary | ICD-10-CM | POA: Insufficient documentation

## 2015-09-20 DIAGNOSIS — R05 Cough: Secondary | ICD-10-CM | POA: Diagnosis present

## 2015-09-20 DIAGNOSIS — J441 Chronic obstructive pulmonary disease with (acute) exacerbation: Secondary | ICD-10-CM | POA: Diagnosis not present

## 2015-09-20 DIAGNOSIS — R531 Weakness: Secondary | ICD-10-CM | POA: Insufficient documentation

## 2015-09-20 DIAGNOSIS — J40 Bronchitis, not specified as acute or chronic: Secondary | ICD-10-CM

## 2015-09-20 DIAGNOSIS — I10 Essential (primary) hypertension: Secondary | ICD-10-CM | POA: Insufficient documentation

## 2015-09-20 DIAGNOSIS — Z87891 Personal history of nicotine dependence: Secondary | ICD-10-CM | POA: Insufficient documentation

## 2015-09-20 DIAGNOSIS — Z880 Allergy status to penicillin: Secondary | ICD-10-CM | POA: Insufficient documentation

## 2015-09-20 LAB — COMPREHENSIVE METABOLIC PANEL
ALT: 17 U/L (ref 14–54)
AST: 20 U/L (ref 15–41)
Albumin: 3.6 g/dL (ref 3.5–5.0)
Alkaline Phosphatase: 104 U/L (ref 38–126)
Anion gap: 11 (ref 5–15)
BUN: 29 mg/dL — ABNORMAL HIGH (ref 6–20)
CO2: 29 mmol/L (ref 22–32)
Calcium: 8.9 mg/dL (ref 8.9–10.3)
Chloride: 104 mmol/L (ref 101–111)
Creatinine, Ser: 1.42 mg/dL — ABNORMAL HIGH (ref 0.44–1.00)
GFR calc Af Amer: 40 mL/min — ABNORMAL LOW (ref >60)
GFR calc non Af Amer: 35 mL/min — ABNORMAL LOW (ref >60)
Glucose, Bld: 129 mg/dL — ABNORMAL HIGH (ref 65–99)
Potassium: 4.5 mmol/L (ref 3.5–5.1)
Sodium: 144 mmol/L (ref 135–145)
Total Bilirubin: 0.5 mg/dL (ref 0.3–1.2)
Total Protein: 7 g/dL (ref 6.5–8.1)

## 2015-09-20 LAB — CBC WITH DIFFERENTIAL/PLATELET
Basophils Absolute: 0 10*3/uL (ref 0.0–0.1)
Basophils Relative: 0 %
Eosinophils Absolute: 0.4 10*3/uL (ref 0.0–0.7)
Eosinophils Relative: 5 %
HCT: 42.4 % (ref 36.0–46.0)
Hemoglobin: 12.9 g/dL (ref 12.0–15.0)
Lymphocytes Relative: 26 %
Lymphs Abs: 2.1 10*3/uL (ref 0.7–4.0)
MCH: 29.4 pg (ref 26.0–34.0)
MCHC: 30.4 g/dL (ref 30.0–36.0)
MCV: 96.6 fL (ref 78.0–100.0)
Monocytes Absolute: 0.8 10*3/uL (ref 0.1–1.0)
Monocytes Relative: 10 %
Neutro Abs: 4.7 10*3/uL (ref 1.7–7.7)
Neutrophils Relative %: 59 %
Platelets: 177 10*3/uL (ref 150–400)
RBC: 4.39 MIL/uL (ref 3.87–5.11)
RDW: 15 % (ref 11.5–15.5)
WBC: 8.1 10*3/uL (ref 4.0–10.5)

## 2015-09-20 LAB — I-STAT TROPONIN, ED: Troponin i, poc: 0 ng/mL (ref 0.00–0.08)

## 2015-09-20 LAB — BRAIN NATRIURETIC PEPTIDE: B Natriuretic Peptide: 49.5 pg/mL (ref 0.0–100.0)

## 2015-09-20 MED ORDER — DOXYCYCLINE HYCLATE 100 MG PO CAPS: 100.0000 mg | ORAL_CAPSULE | Freq: Two times a day (BID) | ORAL | Status: DC

## 2015-09-20 MED ORDER — SODIUM CHLORIDE 0.9 % IV BOLUS (SEPSIS)
500.0000 mL | Freq: Once | INTRAVENOUS | Status: AC
  Administered 2015-09-20: 500 mL via INTRAVENOUS

## 2015-09-20 NOTE — Discharge Instructions (Signed)

## 2015-09-20 NOTE — Telephone Encounter (Signed)
Received fax from [facility] for new Rx Order, benzonatate is a non-covered medication and they have dispensed a 5-day supply, which they are billing the customer for; forwarded to provider/SLS

## 2015-09-20 NOTE — ED Provider Notes (Signed)
CSN: [id number]     Arrival date & time 09/20/15  1520 History   First MD Initiated Contact with Patient 09/20/15 2033     Chief Complaint  Patient presents with  . Cough  . generalized weakness       HPI Per EMS- Patient is a resident of [facility]. Patient was treated for URI 3-4 weeks ago with 2 rounds of antibiotics. Patient was feeling better yesterday, but today and generalized weakness and continues to have a productive cough. Past Medical History  Diagnosis Date  . Asthma   . COPD (chronic obstructive pulmonary disease) ([place])   . DVT (deep vein thrombosis) in pregnancy     hx x multiple on coumadin  . Pulmonary embolism (HCC)     x 2  . GERD (gastroesophageal reflux disease)   . Hypertension   . Osteopenia   . Hypothyroidism   . Hyperlipidemia   . Osteoarthritis     h/o spinal stenosis by MRI 2009  . Hiatal hernia   . Diverticulosis   . Hemorrhoids   . Tubular adenoma of colon 07/2005  . Anxiety and depression   . Cataract   . Stroke [facility])     MINI  . Diabetes mellitus    Past Surgical History  Procedure Laterality Date  . Appendectomy    . Cholecystectomy    . Tubal ligation    . Tonsillectomy    . Lumbar fusion    . Cataract extraction    . Colonoscopy    . Polypectomy     Family History  Problem Relation Age of Onset  . Adopted: Yes  . Cancer Mother     ? colon or ovarian  . Diabetes Mother   . Cancer Brother     ?  . CAD Neg Hx    Social History  Substance Use Topics  . Smoking status: Former Smoker    Types: Cigarettes  . Smokeless tobacco: Never Used  . Alcohol Use: No     Comment: socially   OB History    No data available     Review of Systems  All other systems reviewed and are negative.     Allergies  Penicillins; Codeine; and Sulfonamide derivatives  Home Medications   Prior to Admission medications   Medication Sig Start Date End Date Taking? Authorizing Provider  Albuterol Sulfate (VENTOLIN HFA IN) Inhale  2 puffs into the lungs every 4 (four) hours as needed (sob and wheezing).   Yes Historical Provider, MD  atorvastatin (LIPITOR) 10 MG tablet Take 1 tablet (10 mg total) by mouth at bedtime. 01/17/15  Yes Colon Branch, MD  benzonatate (TESSALON) 100 MG capsule Take 1 capsule (100 mg total) by mouth 3 (three) times daily as needed. Patient taking differently: Take 100 mg by mouth 3 (three) times daily as needed for cough.  08/05/15  Yes Edward Saguier, PA-C  buPROPion (WELLBUTRIN XL) 300 MG 24 hr tablet Take 300 mg by mouth daily.   Yes Historical Provider, MD  carvedilol (COREG) 12.5 MG tablet Take 1 tablet twice a day Patient taking differently: Take 12.5 mg by mouth 2 (two) times daily with a meal.  01/15/14  Yes Cameron Sprang, MD  Dextromethorphan-Guaifenesin [facility] DM) 30-600 MG TB12 Take 1 tablet by mouth 2 (two) times daily as needed (for congestion).    Yes Historical Provider, MD  ezetimibe (ZETIA) 10 MG tablet Take 10 mg by mouth daily.   Yes Historical Provider, MD  gabapentin (  NEURONTIN) 300 MG capsule Take 1 cap in AM, 1 cap at noon, 2 caps at bedtime Patient taking differently: Take 300-600 mg by mouth 3 (three) times daily. Take 1 cap in AM, 1 cap in afternoon, 2 caps at bedtime. 12/21/14  Yes Cameron Sprang, MD  levothyroxine (SYNTHROID, LEVOTHROID) 125 MCG tablet Take 125 mcg by mouth daily before breakfast.   Yes Historical Provider, MD  linagliptin (TRADJENTA) 5 MG TABS tablet Take 5 mg by mouth at bedtime.    Yes Historical Provider, MD  meclizine (ANTIVERT) 25 MG tablet Take 1 tablet (25 mg total) by mouth at bedtime. 03/30/11  Yes Colon Branch, MD  polyethylene glycol [facility] / GLYCOLAX) packet Take 17 g by mouth daily as needed for mild constipation.   Yes Historical Provider, MD  rivaroxaban (XARELTO) 20 MG TABS tablet Take 1 tablet (20 mg total) by mouth daily with supper. 09/14/14  Yes Colon Branch, MD  albuterol (PROVENTIL HFA;VENTOLIN HFA) 108 (90 BASE) MCG/ACT inhaler Inhale 2  puffs into the lungs every 6 (six) hours as needed for wheezing or shortness of breath. 08/05/15   Percell Miller Saguier, PA-C  aspirin-acetaminophen-caffeine (EXCEDRIN MIGRAINE) [phone] MG per tablet Take 2 tablets by mouth every 6 (six) hours as needed for headache (headache).     Historical Provider, MD  azithromycin (ZITHROMAX) 250 MG tablet Take 2 tablets by mouth on the first day of treatment and 1 tablet daily for remaining days. Patient not taking: Reported on 09/20/2015 09/12/15   Colon Branch, MD  doxycycline (VIBRAMYCIN) 100 MG capsule Take 1 capsule (100 mg total) by mouth 2 (two) times daily. 09/20/15   Leonard Schwartz, MD  HYDROcodone-acetaminophen (NORCO/VICODIN) 5-325 MG per tablet Take 1-2 tablets by mouth every 8 (eight) hours as needed. Patient not taking: Reported on 08/13/2015 05/17/15   Colon Branch, MD  predniSONE (DELTASONE) 20 MG tablet Take 1 tablet (20 mg total) by mouth daily with breakfast. Patient not taking: Reported on 09/20/2015 09/12/15   Colon Branch, MD  Propylene Glycol (SYSTANE BALANCE) 0.6 % SOLN Place 1 drop into both eyes 3 (three) times daily as needed (for eyes).     Historical Provider, MD  triamcinolone cream (KENALOG) 0.1 % Apply 1 application topically 2 (two) times daily. Patient not taking: Reported on 08/13/2015 03/26/15   Dorothyann Peng, NP   BP 110/62 mmHg  Pulse 64  Temp(Src) 98.1 F (36.7 C) (Oral)  Resp 18  Ht 5\' 4"  (1.626 m)  Wt 230 lb (104.327 kg)  BMI 39.46 kg/m2  SpO2 96% Physical Exam  Constitutional: She is oriented to person, place, and time. She appears well-developed and well-nourished. No distress.  HENT:  Head: Normocephalic and atraumatic.  Eyes: Pupils are equal, round, and reactive to light.  Neck: Normal range of motion.  Cardiovascular: Normal rate and intact distal pulses.   Pulmonary/Chest: No respiratory distress. She has no wheezes.  Abdominal: Normal appearance. She exhibits no distension.  Musculoskeletal: Normal range of motion.   Neurological: She is alert and oriented to person, place, and time. No cranial nerve deficit.  Skin: Skin is warm and dry. No rash noted.  Psychiatric: She has a normal mood and affect. Her behavior is normal.  Nursing note and vitals reviewed.   ED Course  Procedures (including critical care time) Medications  sodium chloride 0.9 % bolus 500 mL (0 mLs Intravenous Stopped 09/21/15 0011)    Labs Review Labs Reviewed  COMPREHENSIVE METABOLIC PANEL - Abnormal; Notable  for the following:    Glucose, Bld 129 (*)    BUN 29 (*)    Creatinine, Ser 1.42 (*)    GFR calc non Af Amer 35 (*)    GFR calc Af Amer 40 (*)    All other components within normal limits  CBC WITH DIFFERENTIAL/PLATELET  BRAIN NATRIURETIC PEPTIDE  I-STAT TROPOININ, ED    Imaging Review     DG Chest 2 View (Final result) Result time: 09/20/15 21:26:45   Final result by Rad Results In Interface (09/20/15 21:26:45)   Narrative:   CLINICAL DATA: Acute onset of generalized weakness and productive cough. Initial encounter.  EXAM: CHEST 2 VIEW  COMPARISON: Chest radiograph performed 08/05/2015  FINDINGS: The lungs are well-aerated. Mild vascular congestion is noted, with mild bilateral atelectasis. There is no evidence of pleural effusion or pneumothorax.  The heart is normal in size; the mediastinal contour is within normal limits. No acute osseous abnormalities are seen.  IMPRESSION: Mild vascular congestion, with mild bilateral atelectasis.   Electronically Signed By: Garald Balding M.D. On: 09/20/2015 21:26      EKG Interpretation   Date/Time:  Friday September 20 2015 20:54:01 EST Ventricular Rate:  59 PR Interval:  189 QRS Duration: 92 QT Interval:  426 QTC Calculation: 422 R Axis:   -13 Text Interpretation:  Sinus rhythm Low voltage, precordial leads Confirmed  by Lennyx Verdell  MD, Maximiliano Cromartie ([id number]) on 09/20/2015 11:06:16 PM      MDM   Final diagnoses:  Bronchitis         Leonard Schwartz, MD 10/02/15 [phone]

## 2015-09-20 NOTE — ED Notes (Signed)
Per EMS- Patient is a resident of [facility]. Patient was treated for URI 3-4 weeks ago with 2 rounds of antibiotics. Patient was feeling better yesterday, but today and generalized weakness and continues to have a productive cough.

## 2015-09-20 NOTE — Telephone Encounter (Signed)
Calling to f/u on below to see if med will be cont or dc. Ph# [phone]

## 2015-09-20 NOTE — Telephone Encounter (Signed)
Per Dr. Larose Kells, d/c Benzonatate [facility] [place]), recommend Robitussin DM 5 cc po QID PRN. Recommendations faxed back to [facility] at [phone]. Recommendations sent for scanning.

## 2015-09-20 NOTE — Telephone Encounter (Signed)
Forwarded to Provider 01/13; I will not be working paperwork this Pm, I will be working with a provider, so you will need to forward all notes to provider and/or CMA.sls

## 2015-09-21 NOTE — ED Notes (Signed)
[facility]AR here to transport pt back to [facility] facility.

## 2015-09-25 ENCOUNTER — Telehealth: Payer: Self-pay | Admitting: Internal Medicine

## 2015-09-25 NOTE — Telephone Encounter (Signed)
That is okay. If she is not improving needs to be seen here

## 2015-09-25 NOTE — Telephone Encounter (Signed)
Spoke with Dawn Morrow, verbal orders given. Instructed her to let us know if she is not improving or if she becomes worse.

## 2015-09-25 NOTE — Telephone Encounter (Signed)
Caller name:sarah Relationship to patient: [facility]  Can be [place]:  Reason for call:Patient has a new diagnosis of bronchitis and is on antibiotics for 10 days.  Nanine Means would like an order for skilled nursing for home health.  To be sure the bronchitis resilves

## 2015-09-25 NOTE — Telephone Encounter (Signed)
Please advise, Pt seen at ED on 09/20/2015 for Bronchitis? ED F/U?

## 2015-09-26 ENCOUNTER — Ambulatory Visit: Payer: Medicare Other | Admitting: Internal Medicine

## 2015-09-30 ENCOUNTER — Telehealth: Payer: Self-pay | Admitting: Internal Medicine

## 2015-09-30 NOTE — Telephone Encounter (Signed)
Spoke with Morey Hummingbird, verbal orders given.

## 2015-09-30 NOTE — Telephone Encounter (Signed)
Caller name: Morey Hummingbird from [place]  Relation to pt: RN Call back [phone]  Reason for call:  Requesting verbal orders for frequency 1x 1 2x 3 access and oberservation for bronchitis [facility] prescribed  [facility])  2 x per day until the 24th.

## 2015-10-04 ENCOUNTER — Telehealth: Payer: Self-pay | Admitting: Internal Medicine

## 2015-10-04 NOTE — Telephone Encounter (Signed)
Spoke with Morey Hummingbird, informed her that Pt would need to be seen by Dr. Larose Kells or another provider for further antibiotics. Informed her that providers here have no openings this afternoon. Offered to look at [facility] appointments. Morey Hummingbird informed me that the earliest that transportation can be arranged would be Monday. Informed her if that is the case, then I recommend taking Pt to ED or urgent care. Morey Hummingbird verbalized understanding.

## 2015-10-04 NOTE — Telephone Encounter (Signed)
Caller name:Carrie - Home Health Nurse   Can be reached: [phone]  Reason for call: She says that the pt has completed 2 rounds of antibiotics and is still coughing up green stuff. She would like a call back today to discuss further.

## 2015-10-07 ENCOUNTER — Ambulatory Visit: Payer: Medicare Other | Admitting: Audiology

## 2015-10-07 ENCOUNTER — Ambulatory Visit: Payer: Medicare Other | Admitting: Internal Medicine

## 2015-10-09 ENCOUNTER — Ambulatory Visit: Payer: Medicare Other | Admitting: Audiology

## 2015-10-09 ENCOUNTER — Ambulatory Visit (INDEPENDENT_AMBULATORY_CARE_PROVIDER_SITE_OTHER): Payer: Medicare Other | Admitting: Internal Medicine

## 2015-10-09 ENCOUNTER — Telehealth: Payer: Self-pay | Admitting: *Deleted

## 2015-10-09 ENCOUNTER — Encounter: Payer: Self-pay | Admitting: Internal Medicine

## 2015-10-09 VITALS — BP 126/78 | HR 68 | Temp 98.3°F | Ht 64.0 in | Wt 221.2 lb

## 2015-10-09 DIAGNOSIS — E039 Hypothyroidism, unspecified: Secondary | ICD-10-CM

## 2015-10-09 DIAGNOSIS — J449 Chronic obstructive pulmonary disease, unspecified: Secondary | ICD-10-CM

## 2015-10-09 DIAGNOSIS — M810 Age-related osteoporosis without current pathological fracture: Secondary | ICD-10-CM

## 2015-10-09 DIAGNOSIS — E1159 Type 2 diabetes mellitus with other circulatory complications: Secondary | ICD-10-CM

## 2015-10-09 LAB — TSH: TSH: 1.35 u[IU]/mL (ref 0.35–4.50)

## 2015-10-09 MED ORDER — DENOSUMAB 60 MG/ML ~~LOC~~ SOLN
60.0000 mg | Freq: Once | SUBCUTANEOUS | Status: AC
  Administered 2015-10-09: 60 mg via SUBCUTANEOUS

## 2015-10-09 NOTE — Patient Instructions (Signed)
BEFORE YOU LEAVE THE OFFICE:  GO TO THE LAB : Get the blood work    GO TO THE FRONT DESK  Schedule a routine office visit or check up to be done in  3 months, no   fasting   Front desk: 30

## 2015-10-09 NOTE — Progress Notes (Signed)
Subjective:    Patient ID: Dawn Morrow, female    DOB: 21-Apr-1936, 80 y.o.   MRN: [id number]  DOS:  10/09/2015 Type of visit - description : ROV Interval history: COPD: Since the last visit she developed respiratory symptoms, I sent a Z-Pak 09/12/2015. Went to the ER 09/20/2015: Chest x-ray neg for pneumonia, RX doxy At this point, she still has cough but is minimal, occasional greenish sputum production, she admits she has not asked the nurse at the ALF to give her  Albuterol (ordered prn).  HTN: Good compliance of medication, review records from her assisted living, BPs range from 100-140/60, on one occasion was low in the 84/50. Diabetes: Good compliance of medication, CBGs in the 140s. Osteopenia: Due for prolia  Review of Systems  No sinus pain or congestion. Minimal nasal discharge No nausea, vomiting, diarrhea  Past Medical History  Diagnosis Date  . Asthma   . COPD (chronic obstructive pulmonary disease) ([place])   . DVT (deep vein thrombosis) in pregnancy     hx x multiple on coumadin  . Pulmonary embolism (H[facility])     x 2  . GERD (gastroesophageal reflux disease)   . Hypertension   . Osteopenia   . Hypothyroidism   . Hyperlipidemia   . Osteoarthritis     h/o spinal stenosis by MRI 2009  . Hiatal hernia   . Diverticulosis   . Hemorrhoids   . Tubular adenoma of colon 07/2005  . Anxiety and depression   . Cataract   . Stroke [facility])     MINI  . Diabetes mellitus     Past Surgical History  Procedure Laterality Date  . Appendectomy    . Cholecystectomy    . Tubal ligation    . Tonsillectomy    . Lumbar fusion    . Cataract extraction    . Colonoscopy    . Polypectomy      Social History   Social History  . Marital Status: Divorced    Spouse Name: N/A  . Number of Children: 3  . Years of Education: N/A   Occupational History  . retired    Social History Main Topics  . Smoking status: Former Smoker    Types: Cigarettes  . Smokeless tobacco: Never  Used  . Alcohol Use: No     Comment: socially  . Drug Use: No  . Sexual Activity: Not on file   Other Topics Concern  . Not on file   Social History Narrative   Single,   lost her sister in 2012. 3 children , all live in [place] to [place] late 10-2013   [employer], lives in [place]; nice [place] in [place] [phone], [phone])   Uses a diaper.     Doesn't drive          Medication List       This list is accurate as of: 10/09/15 11:59 PM.  Always use your most recent med list.               albuterol 108 (90 Base) MCG/ACT inhaler  Commonly known as:  PROVENTIL HFA;VENTOLIN HFA  Inhale 2 puffs into the lungs every 6 (six) hours as needed for wheezing or shortness of breath.     aspirin-acetaminophen-caffeine 250-250-65 MG tablet  Commonly known as:  EXCEDRIN MIGRAINE  Take 2 tablets by mouth every 6 (six) hours as needed for headache (headache). Reported on 10/09/2015     atorvastatin  10 MG tablet  Commonly known as:  LIPITOR  Take 1 tablet (10 mg total) by mouth at bedtime.     azithromycin 250 MG tablet  Commonly known as:  ZITHROMAX  Take 2 tablets by mouth on the first day of treatment and 1 tablet daily for remaining days.     benzonatate 100 MG capsule  Commonly known as:  TESSALON  Take 1 capsule (100 mg total) by mouth 3 (three) times daily as needed.     buPROPion 300 MG 24 hr tablet  Commonly known as:  WELLBUTRIN XL  Take 300 mg by mouth daily.     carvedilol 12.5 MG tablet  Commonly known as:  COREG  Take 1 tablet twice a day     denosumab 60 MG/ML Soln injection  Commonly known as:  PROLIA  Inject 60 mg into the skin every 6 (six) months. Reported on 10/09/2015     ezetimibe 10 MG tablet  Commonly known as:  ZETIA  Take 10 mg by mouth daily.     gabapentin 300 MG capsule  Commonly known as:  NEURONTIN  Take 1 cap in AM, 1 cap at noon, 2 caps at bedtime     HYDROcodone-acetaminophen 5-325 MG tablet  Commonly known as:   NORCO/VICODIN  Take 1-2 tablets by mouth every 8 (eight) hours as needed.     levothyroxine 125 MCG tablet  Commonly known as:  SYNTHROID, LEVOTHROID  Take 125 mcg by mouth daily before breakfast.     meclizine 25 MG tablet  Commonly known as:  ANTIVERT  Take 1 tablet (25 mg total) by mouth at bedtime.     MUCINEX DM 30-600 MG Tb12  Take 1 tablet by mouth 2 (two) times daily as needed (for congestion).     guaiFENesin-dextromethorphan 100-10 MG/5ML syrup  Commonly known as:  ROBITUSSIN DM  Take 5 mLs by mouth every 12 (twelve) hours as needed for cough.     OXYGEN  Inhale into the lungs. As directed.     polyethylene glycol packet  Commonly known as:  MIRALAX / GLYCOLAX  Take 17 g by mouth daily as needed for mild constipation. Reported on 10/09/2015     predniSONE 20 MG tablet  Commonly known as:  DELTASONE  Take 1 tablet (20 mg total) by mouth daily with breakfast.     rivaroxaban 20 MG Tabs tablet  Commonly known as:  XARELTO  Take 1 tablet (20 mg total) by mouth daily with supper.     SYSTANE BALANCE 0.6 % Soln  Generic drug:  Propylene Glycol  Place 1 drop into both eyes 3 (three) times daily as needed (for eyes). Reported on 10/09/2015     TRADJENTA 5 MG Tabs tablet  Generic drug:  linagliptin  Take 5 mg by mouth at bedtime.     triamcinolone cream 0.1 %  Commonly known as:  KENALOG  Apply 1 application topically 2 (two) times daily.           Objective:   Physical Exam BP 126/78 mmHg  Pulse 68  Temp(Src) 98.3 F (36.8 C) (Oral)  Ht 5\' 4"  (1.626 m)  Wt 221 lb 4 oz (100.358 kg)  BMI 37.96 kg/m2  SpO2 97% General:   Well developed, well nourished . NAD.  HEENT:  Normocephalic . Face symmetric, atraumatic. Nose is slightly congested, sinuses no TTP Lungs:  CTA B. No wheezing. Normal respiratory effort, no intercostal retractions, no accessory muscle use. Heart: RRR,  no murmur.  No pretibial edema bilaterally  Skin: Not pale. Not  jaundice Neurologic:  alert & oriented X3.  Speech normal, gait assisted, has a walker, at baseline Psych--    No anxious or depressed appearing.      Assessment & Plan:    Assessment DM Hypertension Hypothyroidism Hyperlipidemia Chronic renal insufficiency creatinine ~1.3, 1.5 Hematology: on xarelto --Multiple DVTs --PE 2 Anxiety and depression COPD asthma,home  O2 Neuro: --h/o  stroke --Tremor  GI: --GERD, diverticulosis, HH, hemorrhoids, MSK: --Osteoporosis, normal vitamin D . T score 07-2015: -3.6, Rx Prolia  --DJD --Spinal stenosis, MRI 2009  Plan: DM: Last A1c 7.7, I decided not to increase her medication (avoid metformin d/t CKD, avoid glimepiride due to hypoglycemia risk, reconsider add meds if A1c increases) Hypertension: Last BMP satisfactory, creatinine is stable. BP today is very good, occasionally out of range at her ALF. No change for now.  . Hypothyroidism: Check a TSH Hyperlipidemia: Controlled, no change COPD, asthma: Had respiratory sx  in the last few weeks, s/p 2 abx, prednisone x 1, overall feeling better,  I encouraged her to ask for albuterol as needed, no change for now. If she continue having respiratory symptoms consider a 2nd round of prednisone. Osteoporosis: prolia today RTC 3 months.

## 2015-10-09 NOTE — Progress Notes (Signed)
Pre visit review using our clinic review tool, if applicable. No additional management support is needed unless otherwise documented below in the visit note. 

## 2015-10-09 NOTE — Telephone Encounter (Signed)
Received request for face-to-face Acknowledgment for Home Health on encounter dates between 06/30/15-10/26/15, reports printed for dates given and attached to request, billing sheet attached; forwarded to provider/SLS 02/01

## 2015-10-11 ENCOUNTER — Telehealth: Payer: Self-pay | Admitting: *Deleted

## 2015-10-11 NOTE — Telephone Encounter (Signed)
Received [place] from [facility]; forwarded to provider/SLS 02/03

## 2015-10-16 ENCOUNTER — Telehealth: Payer: Self-pay | Admitting: Internal Medicine

## 2015-10-16 NOTE — Telephone Encounter (Signed)
OV notes faxed to [facility] as requested.

## 2015-10-16 NOTE — Telephone Encounter (Signed)
Received fax confirmation on 10/16/2015 at [place].

## 2015-10-16 NOTE — Telephone Encounter (Signed)
Caller name: Mabeline Caras from [place]  Relation to pt: Call back number: [phone]  Reason for call:  Requesting the most recent office notes from 10/09/2015 please fax to [facility] fax# [phone]

## 2015-10-17 ENCOUNTER — Telehealth: Payer: Self-pay | Admitting: Internal Medicine

## 2015-10-17 ENCOUNTER — Ambulatory Visit: Payer: Medicare Other | Attending: Internal Medicine | Admitting: Audiology

## 2015-10-17 DIAGNOSIS — H906 Mixed conductive and sensorineural hearing loss, bilateral: Secondary | ICD-10-CM

## 2015-10-17 DIAGNOSIS — H9193 Unspecified hearing loss, bilateral: Secondary | ICD-10-CM

## 2015-10-17 DIAGNOSIS — H748X2 Other specified disorders of left middle ear and mastoid: Secondary | ICD-10-CM | POA: Diagnosis present

## 2015-10-17 DIAGNOSIS — H93292 Other abnormal auditory perceptions, left ear: Secondary | ICD-10-CM

## 2015-10-17 DIAGNOSIS — R94120 Abnormal auditory function study: Secondary | ICD-10-CM | POA: Diagnosis present

## 2015-10-17 NOTE — Telephone Encounter (Signed)
ENT referral placed.

## 2015-10-17 NOTE — Telephone Encounter (Signed)
FYI. Please advise.

## 2015-10-17 NOTE — Telephone Encounter (Signed)
Please arrange the ENT referral. If she has URI type of symptoms and ear pain will consider antibiotics for possible ear infection >>> let the patient know.

## 2015-10-17 NOTE — Telephone Encounter (Signed)
Neoma Laming with [facility] Ph# [phone]  Fluid in left ear, not very red, possible ear infection Severe hearing loss in both ears - needs hearing aids - needs to see ENT - she needs free hearing aid program - recommend [facility] ph# [phone] or Dr. Memory Dance or Dr. Lucia Gaskins because audiologist they work with works with free hearing aid program ([facility] ph# [phone]). Best bet would be [facility]. It's called the EDF hearing aid free thru [place] [facility] [place].   Pt to call transportation for rest home to get pt schedule - CALL [facility] AT [phone]

## 2015-10-17 NOTE — Procedures (Signed)
Outpatient Audiology and [place]  [place], [place] [address]  [phone]   Audiological Evaluation  Patient Name: Dawn Morrow   Status: Outpatient   DOB: Aug 17, 1936    Diagnosis: Failed hearing screen MRN: [id number] Date:  10/17/2015     Referent: Kathlene November, MD  History: Dawn Morrow was seen for an audiological evaluation . She was accompanied by Dawn Morrow who states that Dawn Morrow's hearing has become worse over the past 6-12 months. Dawn Morrow states that the "left ear feels full" - she wondered if it was "full of wax". Dawn Morrow states that she had "bad bronchitis a couple of weeks ago".  Dawn Morrow states that she "sometimes feels off balance" and that "sitting still helps".    Evaluation: Conventional pure tone audiometry from 250Hz  - 8000Hz  with using insert earphones.  The hearing loss is mixed bilaterally. Hearing Thresholds: Right ear:  Thresholds of 45-80 dBHL  Left ear:    Thresholds of 60 to no response dBHL Reliability is good Speech reception levels (repeating words near threshold) using recorded spondee word lists:  Right ear: 50 dBHL.  Left ear:  65 dBHL Word recognition (at comfortably loud volumes) using recorded word lists at 45 dBHL, in quiet.  Right ear: 94%.  Left ear:   80% Tympanometry (middle ear function)  Right ear: Normal (Type A)  Left ear:  Abnormal with negative pressure of -375 daPa. . CONCLUSION:      Dawn Morrow was very pleasant to talk with.  She understood the required tasks easily.  Dawn Morrow has bilateral hearing loss that will adversely affect her communication.  Currently she needs face to face communication at 2-3 feet, spoken in a very loud voice for optimal word recognition.  In the right ear she has a moderate to severe mixed hearing loss. The left ear hearing is poorer with a moderately severe to severe low frequency hearing loss sloping to a profound high frequency mixed hearing loss.  Dawn Morrow' word recognition in quiet is  excellent on the right side and good on the left side at very loud levels.  Dawn Morrow appears to be an excellent candidate for hearing aid amplification; therefore a hearing aid evaluation is recommended.  Please note that Dawn Morrow has abnormal middle ear function on the left side and normal middle ear function on the right side.  Dawn Morrow will need medical clearance before a hearing aid evaluation, therefore referral to an ENT is recommended.  RECOMMENDATIONS: 1.   Follow-up with Dr. Larose Kells regarding a referral to an ENT because of the abnormal middle ear function on the left side and referral for a hearing aid evaluation.    2.   I left a message for Dr. Ethel Rana nurse regarding the offices in [place] that participate in the [employer] Distribution Program that may help obtaining a hearing aid for [place].  These office included [facility] [phone]),  [facility] [phone]), [facility] [phone]) and the [facility] speech and hearing center [phone]).  3.  Strategies that help improve hearing include: A) Face the speaker directly. Optimal is having the speakers face well - lit.  Unless amplified, being within 3-6 feet of the speaker will enhance word recognition. B) Avoid having the speaker back-lit as this will minimize the ability to use cues from lip-reading, facial expression and gestures. C)  Word recognition is poorer in background noise. For optimal word recognition, turn off the TV, radio or noisy fan when engaging in conversation.  In a restaurant, try to sit away from noise sources and close to the primary speaker.        D)  Ask for topic clarification from time to time in order to remain in the conversation.  Most people don't mind repeating or clarifying a       point when asked.  If needed, explain the difficulty hearing in background noise or hearing loss.   Keiona Jenison L. Heide Spark, Au.D., CCC-A Doctor of Audiology 10/17/2015

## 2015-10-21 ENCOUNTER — Other Ambulatory Visit: Payer: Self-pay

## 2015-10-21 MED ORDER — ATORVASTATIN CALCIUM 10 MG PO TABS: 10.0000 mg | ORAL_TABLET | Freq: Every day | ORAL | Status: AC

## 2015-10-25 ENCOUNTER — Telehealth: Payer: Self-pay

## 2015-10-25 NOTE — Telephone Encounter (Signed)
Received [place] [facility] Request for Prior Approval CMN/PA, placed in Dr. Ethel Rana red folder for completion.

## 2015-10-28 NOTE — Telephone Encounter (Signed)
Received fax confirmation on 10/28/2015 at 4:59 PM.

## 2015-10-28 NOTE — Telephone Encounter (Signed)
Completed form received and faxed back to [employer] at [phone]. Form sent for scanning.

## 2015-11-13 ENCOUNTER — Telehealth: Payer: Self-pay | Admitting: *Deleted

## 2015-11-13 NOTE — Telephone Encounter (Signed)
Received request for physicians orders; forwarded to provider/SLS 03/07 Completed form returned and faxed to [facility]/SLS 03/08

## 2015-11-14 ENCOUNTER — Telehealth: Payer: Self-pay | Admitting: Internal Medicine

## 2015-11-14 NOTE — Telephone Encounter (Signed)
Fax confirmation received. 

## 2015-11-14 NOTE — Telephone Encounter (Signed)
Spoke to [place], Therapist, sports from [employer].  Results and new order given.  Morey Hummingbird advised for order to back faxed to facility at [phone].  Verbal order given for nursing frequency for medication effectiveness and adherence, monitoring for side effects---2 wks 1, 1 wk 1.    Order faxed to facility.  Awaiting fax confirmation.

## 2015-11-14 NOTE — Telephone Encounter (Signed)
UA and urine culture collected 11/12/2015: UCX + for serratia, needs to take ciprofloxacin 500 mg one by mouth twice a day #6 no refills Lives @ [facility]

## 2015-11-18 ENCOUNTER — Telehealth: Payer: Self-pay | Admitting: *Deleted

## 2015-11-18 NOTE — Telephone Encounter (Signed)
Received Consultation Report; forwarded to provider/SLS 03/13

## 2015-11-28 ENCOUNTER — Telehealth: Payer: Self-pay

## 2015-11-28 NOTE — Telephone Encounter (Signed)
[facility] requesting orders to check blood sugar as needed on Pt. Will call if blood sugar is > 400 and if blood sugar < 65. Form signed and faxed back to [facility] at [phone]. Form sent for scanning.

## 2015-12-11 ENCOUNTER — Telehealth: Payer: Self-pay | Admitting: *Deleted

## 2015-12-11 NOTE — Telephone Encounter (Signed)
Received [facility] PSA/PSP, patient's current care plan from [facility]; forwarded to provider [pt as Resident has not signed, nor has [facility] representative]/SLS 04/05

## 2015-12-19 ENCOUNTER — Telehealth: Payer: Self-pay

## 2015-12-19 NOTE — Telephone Encounter (Signed)
Pt due for next Prolia injection on or after 04/07/2016. Re-verification started. Re-verification form sent for scanning.

## 2015-12-23 NOTE — Telephone Encounter (Signed)
Message sent to Tricia for Prolia order.  

## 2015-12-23 NOTE — Telephone Encounter (Addendum)
Received Summary of benefits, "benefits subject to a $183 deductible (already met), and 20 % co-insurance for the administration and cost of Prolia. No PA required. "Medicaid will consider the Part B deductible and co-insurance up to Medicaid's allowable. If the primary plan reimburses an amount greater than or equal to Medicaid's allowable no additional payment will be made." Summary of benefits sent for scanning.

## 2015-12-24 ENCOUNTER — Telehealth: Payer: Self-pay | Admitting: *Deleted

## 2015-12-24 NOTE — Telephone Encounter (Signed)
Received paperwork RE: patient placed on Nutrition at Risk Program; forwarded to provider/SLS 04/18

## 2015-12-25 NOTE — Telephone Encounter (Signed)
Faxed

## 2015-12-26 NOTE — Telephone Encounter (Signed)
Prolia received, in fridge.

## 2016-01-08 ENCOUNTER — Encounter: Payer: Self-pay | Admitting: Internal Medicine

## 2016-01-08 ENCOUNTER — Ambulatory Visit (INDEPENDENT_AMBULATORY_CARE_PROVIDER_SITE_OTHER): Payer: Medicare Other | Admitting: Internal Medicine

## 2016-01-08 VITALS — BP 90/60 | HR 69 | Temp 97.7°F | Ht 64.0 in | Wt 222.0 lb

## 2016-01-08 DIAGNOSIS — E1159 Type 2 diabetes mellitus with other circulatory complications: Secondary | ICD-10-CM | POA: Diagnosis not present

## 2016-01-08 DIAGNOSIS — I10 Essential (primary) hypertension: Secondary | ICD-10-CM | POA: Diagnosis not present

## 2016-01-08 DIAGNOSIS — Z09 Encounter for follow-up examination after completed treatment for conditions other than malignant neoplasm: Secondary | ICD-10-CM

## 2016-01-08 LAB — BASIC METABOLIC PANEL
BUN: 24 mg/dL — ABNORMAL HIGH (ref 6–23)
CO2: 34 mEq/L — ABNORMAL HIGH (ref 19–32)
Calcium: 9.6 mg/dL (ref 8.4–10.5)
Chloride: 100 mEq/L (ref 96–112)
Creatinine, Ser: 1.46 mg/dL — ABNORMAL HIGH (ref 0.40–1.20)
GFR: 36.84 mL/min — ABNORMAL LOW (ref >60.00)
Glucose, Bld: 181 mg/dL — ABNORMAL HIGH (ref 70–99)
Potassium: 5 mEq/L (ref 3.5–5.1)
Sodium: 141 mEq/L (ref 135–145)

## 2016-01-08 LAB — HEMOGLOBIN A1C: Hgb A1c MFr Bld: 8.4 % — ABNORMAL HIGH (ref 4.6–6.5)

## 2016-01-08 NOTE — Progress Notes (Signed)
Pre visit review using our clinic review tool, if applicable. No additional management support is needed unless otherwise documented below in the visit note. 

## 2016-01-08 NOTE — Progress Notes (Signed)
Subjective:    Patient ID: Dawn Morrow, female    DOB: [birth year], 80 y.o.   MRN: [id number]  DOS:  01/08/2016 Type of visit - description : Routine checkup Interval history:  was diagnosed with a UTI 11/14/2015, s/p Cipro, now asymptomatic COPD: States is better, still has occasional cough with mucus production. DM:   CBGs are checking sporadically, the numbers available from her nursing home are 164 and 241. States that she ate Oreos when the sugar reading was high. HTN: BP today is low, she is asymptomatic, available BPs from the nursing home 142/70, 180/70. Complaining of occasional tingling in her feet, day or night.  Review of Systems No chest pain, lower extremity edema No nausea, vomiting, diarrhea. No blood in the stools No dysuria, gross hematuria difficulty urinating  Past Medical History  Diagnosis Date  . Asthma   . COPD (chronic obstructive pulmonary disease) ([place])   . DVT (deep vein thrombosis) in pregnancy     hx x multiple on coumadin  . Pulmonary embolism ([facility])     x 2  . GERD (gastroesophageal reflux disease)   . Hypertension   . Osteopenia   . Hypothyroidism   . Hyperlipidemia   . Osteoarthritis     h/o spinal stenosis by MRI 2009  . Hiatal hernia   . Diverticulosis   . Hemorrhoids   . Tubular adenoma of colon 07/2005  . Anxiety and depression   . Cataract   . Stroke [facility])     MINI  . Diabetes mellitus     Past Surgical History  Procedure Laterality Date  . Appendectomy    . Cholecystectomy    . Tubal ligation    . Tonsillectomy    . Lumbar fusion    . Cataract extraction    . Colonoscopy    . Polypectomy      Social History   Social History  . Marital Status: Divorced    Spouse Name: N/A  . Number of Children: 3  . Years of Education: N/A   Occupational History  . retired    Social History Main Topics  . Smoking status: Former Smoker    Types: Cigarettes  . Smokeless tobacco: Never Used  . Alcohol Use: No     Comment:  socially  . Drug Use: No  . Sexual Activity: Not on file   Other Topics Concern  . Not on file   Social History Narrative   Single,   lost her sister in 2012. 3 children , all live in [place] to [place] late 10-2013   [employer], lives in [place]; nice [place] in [place] [phone], [phone])   Uses a diaper.     Doesn't drive          Medication List       This list is accurate as of: 01/08/16  1:01 PM.  Always use your most recent med list.               albuterol 108 (90 Base) MCG/ACT inhaler  Commonly known as:  PROVENTIL HFA;VENTOLIN HFA  Inhale 2 puffs into the lungs every 6 (six) hours as needed for wheezing or shortness of breath.     aspirin-acetaminophen-caffeine 250-250-65 MG tablet  Commonly known as:  EXCEDRIN MIGRAINE  Take 2 tablets by mouth every 6 (six) hours as needed for headache (headache). Reported on 10/09/2015     atorvastatin 10 MG tablet  Commonly known as:  LIPITOR  Take 1 tablet (10 mg total) by mouth at bedtime.     buPROPion 300 MG 24 hr tablet  Commonly known as:  WELLBUTRIN XL  Take 300 mg by mouth daily.     carvedilol 12.5 MG tablet  Commonly known as:  COREG  Take 1 tablet twice a day     denosumab 60 MG/ML Soln injection  Commonly known as:  PROLIA  Inject 60 mg into the skin every 6 (six) months. Reported on 10/09/2015     ezetimibe 10 MG tablet  Commonly known as:  ZETIA  Take 10 mg by mouth daily.     gabapentin 300 MG capsule  Commonly known as:  NEURONTIN  Take 1 cap in AM, 1 cap at noon, 2 caps at bedtime     HYDROcodone-acetaminophen 5-325 MG tablet  Commonly known as:  NORCO/VICODIN  Take 1-2 tablets by mouth every 8 (eight) hours as needed.     levothyroxine 125 MCG tablet  Commonly known as:  SYNTHROID, LEVOTHROID  Take 125 mcg by mouth daily before breakfast.     meclizine 25 MG tablet  Commonly known as:  ANTIVERT  Take 1 tablet (25 mg total) by mouth at bedtime.     MUCINEX DM 30-600 MG  Tb12  Take 1 tablet by mouth 2 (two) times daily as needed (for congestion).     guaiFENesin-dextromethorphan 100-10 MG/5ML syrup  Commonly known as:  ROBITUSSIN DM  Take 5 mLs by mouth every 12 (twelve) hours as needed for cough.     OXYGEN  Inhale into the lungs. As directed.     polyethylene glycol packet  Commonly known as:  MIRALAX / GLYCOLAX  Take 17 g by mouth daily as needed for mild constipation. Reported on 10/09/2015     rivaroxaban 20 MG Tabs tablet  Commonly known as:  XARELTO  Take 1 tablet (20 mg total) by mouth daily with supper.     SYSTANE BALANCE 0.6 % Soln  Generic drug:  Propylene Glycol  Place 1 drop into both eyes 3 (three) times daily as needed (for eyes). Reported on 10/09/2015     TRADJENTA 5 MG Tabs tablet  Generic drug:  linagliptin  Take 5 mg by mouth at bedtime.           Objective:   Physical Exam BP 90/60 mmHg  Pulse 69  Temp(Src) 97.7 F (36.5 C) (Oral)  Ht 5\' 4"  (1.626 m)  Wt 222 lb (100.699 kg)  BMI 38.09 kg/m2  SpO2 96% General:   Well developed, well nourished . NAD.  HEENT:  Normocephalic . Face symmetric, atraumatic Lungs:  Decreased breath sounds over was clear Normal respiratory effort, no intercostal retractions, no accessory muscle use. Heart: RRR,  no murmur.  Trace  pretibial edema bilaterally  Diabetic feet exam: Skin normal, good pulses, mild patchy  decrease in sensitivity. Skin: Not pale. Not jaundice Neurologic:  alert & oriented to time, space, self. Knew who the president is  Psych--  Cognition and judgment appear intact.  Cooperative with normal attention span and concentration.  Behavior appropriate. No anxious or depressed appearing.      Assessment & Plan:   Assessment DM, + Neuropathy DX 01/2016 Hypertension Hypothyroidism Hyperlipidemia Anxiety, depression Chronic renal insufficiency creatinine ~1.3, 1.5 Hematology: on xarelto --Multiple DVTs --PE 2 COPD asthma,home  O2 Neuro: --h/o   stroke --Tremor  GI: --GERD, diverticulosis, HH, hemorrhoids, MSK: --Osteoporosis, normal vitamin D . T score 07-2015: -3.6,  Prol[place] 10-2015,  next 04-2016 --DJD --Spinal stenosis, MRI 2009  Plan: DM: Continue with Tradjenta, check A1c. New dx of neuropathy Feet care discussed HTN: Continue carvedilol, BP today is low, has been a little high at the nursing home. Will ask the nursing home to check BPs daily and call if elevated. Check a BMP COPD: Currently doing well, has occasional cough, exam today benign, only on albuterol as needed Anxiety depression: om Wellbutrin, sx controlled Hypothyroidism: Good compliance of medication, last TSH satisfactory RTC 3 months

## 2016-01-08 NOTE — Patient Instructions (Addendum)
GO TO THE LAB :      Get the blood work     GO TO THE FRONT DESK  Schedule your next appointment for a  routine checkup in 3-4 months. No fasting    Check the  blood pressure daily Be sure your blood pressure is between 110/65 and  145/85. If it is consistently higher or lower, let me know   Diabetes and Foot Care Diabetes may cause you to have problems because of poor blood supply (circulation) to your feet and legs. This may cause the skin on your feet to become thinner, break easier, and heal more slowly. Your skin may become dry, and the skin may peel and crack. You may also have nerve damage in your legs and feet causing decreased feeling in them. You may not notice minor injuries to your feet that could lead to infections or more serious problems. Taking care of your feet is one of the most important things you can do for yourself.  HOME CARE INSTRUCTIONS  Wear shoes at all times, even in the house. Do not go barefoot. Bare feet are easily injured.  Check your feet daily for blisters, cuts, and redness. If you cannot see the bottom of your feet, use a mirror or ask someone for help.  Wash your feet with warm water (do not use hot water) and mild soap. Then pat your feet and the areas between your toes until they are completely dry. Do not soak your feet as this can dry your skin.  Apply a moisturizing lotion or petroleum jelly (that does not contain alcohol and is unscented) to the skin on your feet and to dry, brittle toenails. Do not apply lotion between your toes.  Trim your toenails straight across. Do not dig under them or around the cuticle. File the edges of your nails with an emery board or nail file.  Do not cut corns or calluses or try to remove them with medicine.  Wear clean socks or stockings every day. Make sure they are not too tight. Do not wear knee-high stockings since they may decrease blood flow to your legs.  Wear shoes that fit properly and have enough  cushioning. To break in new shoes, wear them for just a few hours a day. This prevents you from injuring your feet. Always look in your shoes before you put them on to be sure there are no objects inside.  Do not cross your legs. This may decrease the blood flow to your feet.  If you find a minor scrape, cut, or break in the skin on your feet, keep it and the skin around it clean and dry. These areas may be cleansed with mild soap and water. Do not cleanse the area with peroxide, alcohol, or iodine.  When you remove an adhesive bandage, be sure not to damage the skin around it.  If you have a wound, look at it several times a day to make sure it is healing.  Do not use heating pads or hot water bottles. They may burn your skin. If you have lost feeling in your feet or legs, you may not know it is happening until it is too late.  Make sure your health care provider performs a complete foot exam at least annually or more often if you have foot problems. Report any cuts, sores, or bruises to your health care provider immediately. SEEK MEDICAL CARE IF:   You have an injury that is not healing.  You have cuts or breaks in the skin.  You have an ingrown nail.  You notice redness on your legs or feet.  You feel burning or tingling in your legs or feet.  You have pain or cramps in your legs and feet.  Your legs or feet are numb.  Your feet always feel cold. SEEK IMMEDIATE MEDICAL CARE IF:   There is increasing redness, swelling, or pain in or around a wound.  There is a red line that goes up your leg.  Pus is coming from a wound.  You develop a fever or as directed by your health care provider.  You notice a bad smell coming from an ulcer or wound.   This information is not intended to replace advice given to you by your health care provider. Make sure you discuss any questions you have with your health care provider.   Document Released: 08/21/2000 Document Revised: 04/26/2013  Document Reviewed: 01/31/2013 Elsevier Interactive Patient Education [employer].

## 2016-01-08 NOTE — Assessment & Plan Note (Signed)
DM: Continue with Tradjenta, check A1c. New dx of neuropathy Feet care discussed HTN: Continue carvedilol, BP today is low, has been a little high at the nursing home. Will ask the nursing home to check BPs daily and call if elevated. Check a BMP COPD: Currently doing well, has occasional cough, exam today benign, only on albuterol as needed Anxiety depression: om Wellbutrin, sx controlled Hypothyroidism: Good compliance of medication, last TSH satisfactory RTC 3 months

## 2016-01-13 NOTE — Progress Notes (Signed)
Quick Note:  Called and LMOM @ 10:54am @ [phone]) for the pt and also [facility] @ 10:57am [phone]) with the pt's niece [name]) to RTC regarding the pt's recent lab results.//AB/CMA ______

## 2016-01-14 MED ORDER — REPAGLINIDE 0.5 MG PO TABS: 0.5000 mg | ORAL_TABLET | Freq: Three times a day (TID) | ORAL | Status: DC

## 2016-01-14 NOTE — Addendum Note (Signed)
Addended by: Damita Dunnings D on: 01/14/2016 11:48 AM   Modules accepted: Orders

## 2016-03-18 ENCOUNTER — Ambulatory Visit (HOSPITAL_BASED_OUTPATIENT_CLINIC_OR_DEPARTMENT_OTHER)
Admission: RE | Admit: 2016-03-18 | Discharge: 2016-03-18 | Disposition: A | Discharge status: Home / Self Care | Payer: Medicare Other | Source: Ambulatory Visit | Attending: Medical | Admitting: Medical

## 2016-03-18 ENCOUNTER — Ambulatory Visit (INDEPENDENT_AMBULATORY_CARE_PROVIDER_SITE_OTHER): Payer: Medicare Other | Admitting: Medical

## 2016-03-18 ENCOUNTER — Encounter: Payer: Self-pay | Admitting: Medical

## 2016-03-18 VITALS — BP 110/70 | HR 77 | Temp 98.2°F | Ht 64.0 in | Wt 221.0 lb

## 2016-03-18 DIAGNOSIS — I998 Other disorder of circulatory system: Secondary | ICD-10-CM | POA: Diagnosis not present

## 2016-03-18 DIAGNOSIS — R110 Nausea: Secondary | ICD-10-CM | POA: Insufficient documentation

## 2016-03-18 DIAGNOSIS — I7389 Other specified peripheral vascular diseases: Secondary | ICD-10-CM | POA: Diagnosis not present

## 2016-03-18 DIAGNOSIS — I671 Cerebral aneurysm, nonruptured: Secondary | ICD-10-CM | POA: Insufficient documentation

## 2016-03-18 DIAGNOSIS — R5383 Other fatigue: Secondary | ICD-10-CM | POA: Diagnosis not present

## 2016-03-18 DIAGNOSIS — R059 Cough, unspecified: Secondary | ICD-10-CM

## 2016-03-18 DIAGNOSIS — R05 Cough: Secondary | ICD-10-CM

## 2016-03-18 DIAGNOSIS — I6523 Occlusion and stenosis of bilateral carotid arteries: Secondary | ICD-10-CM | POA: Insufficient documentation

## 2016-03-18 DIAGNOSIS — R42 Dizziness and giddiness: Secondary | ICD-10-CM | POA: Diagnosis present

## 2016-03-18 DIAGNOSIS — N289 Disorder of kidney and ureter, unspecified: Secondary | ICD-10-CM

## 2016-03-18 DIAGNOSIS — Z8673 Personal history of transient ischemic attack (TIA), and cerebral infarction without residual deficits: Secondary | ICD-10-CM | POA: Insufficient documentation

## 2016-03-18 LAB — CBC WITH DIFFERENTIAL/PLATELET
Basophils Absolute: 0 10*3/uL (ref 0.0–0.1)
Basophils Relative: 0.4 % (ref 0.0–3.0)
Eosinophils Absolute: 0.4 10*3/uL (ref 0.0–0.7)
Eosinophils Relative: 3.7 % (ref 0.0–5.0)
HCT: 40.8 % (ref 36.0–46.0)
Hemoglobin: 13.2 g/dL (ref 12.0–15.0)
Lymphocytes Relative: 19.4 % (ref 12.0–46.0)
Lymphs Abs: 2.1 10*3/uL (ref 0.7–4.0)
MCHC: 32.2 g/dL (ref 30.0–36.0)
MCV: 91.5 fl (ref 78.0–100.0)
Monocytes Absolute: 0.7 10*3/uL (ref 0.1–1.0)
Monocytes Relative: 6.4 % (ref 3.0–12.0)
Neutro Abs: 7.5 10*3/uL (ref 1.4–7.7)
Neutrophils Relative %: 70.1 % (ref 43.0–77.0)
Platelets: 192 10*3/uL (ref 150.0–400.0)
RBC: 4.46 Mil/uL (ref 3.87–5.11)
RDW: 15.6 % — ABNORMAL HIGH (ref 11.5–15.5)
WBC: 10.7 10*3/uL — ABNORMAL HIGH (ref 4.0–10.5)

## 2016-03-18 LAB — COMPREHENSIVE METABOLIC PANEL
ALT: 15 U/L (ref 0–35)
AST: 15 U/L (ref 0–37)
Albumin: 3.7 g/dL (ref 3.5–5.2)
Alkaline Phosphatase: 96 U/L (ref 39–117)
BUN: 27 mg/dL — ABNORMAL HIGH (ref 6–23)
CO2: 33 mEq/L — ABNORMAL HIGH (ref 19–32)
Calcium: 9.1 mg/dL (ref 8.4–10.5)
Chloride: 107 mEq/L (ref 96–112)
Creatinine, Ser: 1.5 mg/dL — ABNORMAL HIGH (ref 0.40–1.20)
GFR: 35.69 mL/min — ABNORMAL LOW (ref >60.00)
Glucose, Bld: 107 mg/dL — ABNORMAL HIGH (ref 70–99)
Potassium: 4.3 mEq/L (ref 3.5–5.1)
Sodium: 136 mEq/L (ref 135–145)
Total Bilirubin: 0.4 mg/dL (ref 0.2–1.2)
Total Protein: 7.2 g/dL (ref 6.0–8.3)

## 2016-03-18 LAB — POC URINALSYSI DIPSTICK (AUTOMATED)
Bilirubin, UA: NEGATIVE
Glucose, UA: NEGATIVE
Ketones, UA: NEGATIVE
Protein, UA: NEGATIVE
Spec Grav, UA: 1.025
Urobilinogen, UA: 0.2
pH, UA: 6

## 2016-03-18 LAB — TSH: TSH: 0.77 u[IU]/mL (ref 0.35–4.50)

## 2016-03-18 MED ORDER — ONDANSETRON 4 MG PO TBDP: 4.0000 mg | ORAL_TABLET | Freq: Three times a day (TID) | ORAL | Status: DC | PRN

## 2016-03-18 NOTE — Patient Instructions (Addendum)
For your fatigue and light headed sensation will get cbc, cmp, and tsh.   You have light headed and nausea sensation for 2 weeks. Will try to get ct of head without contrast.  For nausea will rx zofran low dose if needed.  May rx meclizine in near future. If work up negative.  Get ua and do culture to see if uti may be present.  Follow up in 5 days or as needed.  If severe or worsening changes. Particular dizziness with gross motor or sensory deficits then ED evaualuation.

## 2016-03-18 NOTE — Progress Notes (Signed)
Pre visit review using our clinic review tool, if applicable. No additional management support is needed unless otherwise documented below in the visit note. 

## 2016-03-18 NOTE — Addendum Note (Signed)
Addended by: Tasia Catchings on: 03/18/2016 04:33 PM   Modules accepted: Orders

## 2016-03-18 NOTE — Progress Notes (Signed)
Subjective:    Patient ID: Dawn Morrow, female    DOB: Jul 29, 1936, 80 y.o.   MRN: [id number]  HPI  Pt in with light headed sensation for 2 weeks. Intermittent very poor balance. Sometimes in bed when she turns over will feel very light headed. With light headed sensation no head aches.. Pt feels very tired. No pain on urination. Also no gross motor or sensory function deficits reported.  Some wheezing occasionally. Some chest congestion. Feels like can't clear mucous.   NO diarrhea.  No vomiting but with light headed feels like may vomit at times.  Pt lives at [facility].  Pt blood sugars have not been too elevated.In 100's on review of [facility] readings.    Review of Systems  Constitutional: Positive for fatigue. Negative for fever and chills.  HENT: Negative for congestion, drooling, ear pain, sinus pressure and sneezing.   Respiratory: Positive for cough and wheezing. Negative for chest tightness and shortness of breath.        Faint mild wheeze on occasion. Feels some chest congestion  Gastrointestinal: Positive for nausea. Negative for vomiting, abdominal pain, diarrhea and rectal pain.  Musculoskeletal: Negative for back pain.  Neurological: Positive for light-headedness. Negative for dizziness, syncope, speech difficulty, weakness, numbness and headaches.  Hematological: Negative for adenopathy. Does not bruise/bleed easily.  Psychiatric/Behavioral: Negative for behavioral problems, confusion and dysphoric mood.    Past Medical History  Diagnosis Date  . Asthma   . COPD (chronic obstructive pulmonary disease) ([place])   . DVT (deep vein thrombosis) in pregnancy     hx x multiple on coumadin  . Pulmonary embolism ([facility])     x 2  . GERD (gastroesophageal reflux disease)   . Hypertension   . Osteopenia   . Hypothyroidism   . Hyperlipidemia   . Osteoarthritis     h/o spinal stenosis by MRI 2009  . Hiatal hernia   . Diverticulosis   . Hemorrhoids   . Tubular adenoma  of colon 07/2005  . Anxiety and depression   . Cataract   . Stroke [facility])     MINI  . Diabetes mellitus      Social History   Social History  . Marital Status: Divorced    Spouse Name: N/A  . Number of Children: 3  . Years of Education: N/A   Occupational History  . retired    Social History Main Topics  . Smoking status: Former Smoker    Types: Cigarettes  . Smokeless tobacco: Never Used  . Alcohol Use: No     Comment: socially  . Drug Use: No  . Sexual Activity: Not on file   Other Topics Concern  . Not on file   Social History Narrative   Single,   lost her sister in 2012. 3 children , all live in [place] to [place] late 10-2013   [employer], lives in [place]; nice [place] in [place] [phone], [phone])   Uses a diaper.     Doesn't drive      Past Surgical History  Procedure Laterality Date  . Appendectomy    . Cholecystectomy    . Tubal ligation    . Tonsillectomy    . Lumbar fusion    . Cataract extraction    . Colonoscopy    . Polypectomy      Family History  Problem Relation Age of Onset  . Adopted: Yes  . Cancer Mother     ?  colon or ovarian  . Diabetes Mother   . Cancer Brother     ?  . CAD Neg Hx     Allergies  Allergen Reactions  . Penicillins Anaphylaxis  . Codeine Other (See Comments)    unknown  . Sulfonamide Derivatives Other (See Comments)    unknown    Current Outpatient Prescriptions on File Prior to Visit  Medication Sig Dispense Refill  . albuterol (PROVENTIL HFA;VENTOLIN HFA) 108 (90 BASE) MCG/ACT inhaler Inhale 2 puffs into the lungs every 6 (six) hours as needed for wheezing or shortness of breath. 1 Inhaler 0  . aspirin-acetaminophen-caffeine (EXCEDRIN MIGRAINE) [id number] MG per tablet Take 2 tablets by mouth every 6 (six) hours as needed for headache (headache). Reported on 10/09/2015    . atorvastatin (LIPITOR) 10 MG tablet Take 1 tablet (10 mg total) by mouth at bedtime. 90 tablet 1  . buPROPion  (WELLBUTRIN XL) 300 MG 24 hr tablet Take 300 mg by mouth daily.    . carvedilol (COREG) 12.5 MG tablet Take 1 tablet twice a day (Patient taking differently: Take 12.5 mg by mouth 2 (two) times daily with a meal. ) 180 tablet 3  . denosumab (PROLIA) 60 MG/ML SOLN injection Inject 60 mg into the skin every 6 (six) months. Reported on 10/09/2015    . Dextromethorphan-Guaifenesin (MUCINEX DM) 30-600 MG TB12 Take 1 tablet by mouth 2 (two) times daily as needed (for congestion).     . ezetimibe (ZETIA) 10 MG tablet Take 10 mg by mouth daily.    Marland Kitchen gabapentin (NEURONTIN) 300 MG capsule Take 1 cap in AM, 1 cap at noon, 2 caps at bedtime (Patient taking differently: Take 300-600 mg by mouth 3 (three) times daily. Take 1 cap in AM, 1 cap in afternoon, 2 caps at bedtime.) 120 capsule 6  . guaiFENesin-dextromethorphan (ROBITUSSIN DM) 100-10 MG/5ML syrup Take 5 mLs by mouth every 12 (twelve) hours as needed for cough.    Marland Kitchen HYDROcodone-acetaminophen (NORCO/VICODIN) 5-325 MG per tablet Take 1-2 tablets by mouth every 8 (eight) hours as needed. 30 tablet 0  . levothyroxine (SYNTHROID, LEVOTHROID) 125 MCG tablet Take 125 mcg by mouth daily before breakfast.    . linagliptin (TRADJENTA) 5 MG TABS tablet Take 5 mg by mouth at bedtime.     . meclizine (ANTIVERT) 25 MG tablet Take 1 tablet (25 mg total) by mouth at bedtime. 30 tablet 6  . OXYGEN Inhale into the lungs. As directed.    . polyethylene glycol (MIRALAX / GLYCOLAX) packet Take 17 g by mouth daily as needed for mild constipation. Reported on 10/09/2015    . Propylene Glycol (SYSTANE BALANCE) 0.6 % SOLN Place 1 drop into both eyes 3 (three) times daily as needed (for eyes). Reported on 10/09/2015    . repaglinide (PRANDIN) 0.5 MG tablet Take 1 tablet (0.5 mg total) by mouth 3 (three) times daily before meals. 90 tablet 6  . rivaroxaban (XARELTO) 20 MG TABS tablet Take 1 tablet (20 mg total) by mouth daily with supper. 30 tablet 3   Current Facility-Administered  Medications on File Prior to Visit  Medication Dose Route Frequency Provider Last Rate Last Dose  . zoledronic acid (RECLAST) injection 5 mg  5 mg Intravenous Once Colon Branch, MD        BP 110/70 mmHg  Pulse 77  Temp(Src) 98.2 F (36.8 C) (Oral)  Ht 5\' 4"  (1.626 m)  Wt 221 lb (100.245 kg)  BMI 37.92 kg/m2  SpO2 95%  Objective:   Physical Exam  General Mental Status- Alert. General Appearance- Not in acute distress.   Skin General: Color- Normal Color. Moisture- Normal Moisture.  Neck Carotid Arteries- Normal color. Moisture- Normal Moisture. No carotid bruits. No JVD.  Chest and Lung Exam Auscultation: Breath Sounds:-clear but shallow  Cardiovascular Auscultation:Rythm- Regular. Murmurs & Other Heart Sounds:Auscultation of the heart reveals- No Murmurs.  Abdomen Inspection:-Inspeection Normal. Palpation/Percussion:Note:No mass. Palpation and Percussion of the abdomen reveal- Non Tender, Non Distended + BS, no rebound or guarding.   Neurologic Cranial Nerve exam:- CN III-XII intact(No nystagmus), symmetric smile. Drift Test:- No drift. Finger to Nose:- Normal/Intact Strength:- 5/5 equal and symmetric strength both upper and lower extremities.  Lower ext- no pedal edema. Neg homans signs.  Back- no cva tenderness.      Assessment & Plan:  For your fatigue and light headed sensation will get cbc, cmp, and tsh.   You have light headed and nausea sensation for 2 weeks. Will try to get ct of head without contrast.  For nausea will rx zofran low dose if needed.  May rx meclizine in near future. If work up negative.  Get ua and do culture to see if uti may be present.  Follow up in 5 days or as needed.  If severe or worsening changes. Particular dizziness with gross motor or sensory deficits then ED evaualuation.   Blaize Nipper, Percell Miller, PA-C

## 2016-03-19 ENCOUNTER — Telehealth: Payer: Self-pay

## 2016-03-19 MED ORDER — CIPROFLOXACIN HCL 250 MG PO TABS: 250.0000 mg | ORAL_TABLET | Freq: Two times a day (BID) | ORAL | Status: DC

## 2016-03-19 NOTE — Addendum Note (Signed)
Addended by: Tasia Catchings on: 03/19/2016 11:50 AM   Modules accepted: Orders

## 2016-03-19 NOTE — Telephone Encounter (Signed)
Rx printed and forwarded to provider for signature. Rx for cipro has been signed and faxed to [facility] at [phone]. Their telephone number is [phone].

## 2016-03-20 ENCOUNTER — Ambulatory Visit: Payer: Medicare Other | Admitting: Internal Medicine

## 2016-03-20 ENCOUNTER — Telehealth: Payer: Self-pay | Admitting: Medical

## 2016-03-20 LAB — URINE CULTURE: Colony Count: 60000

## 2016-03-20 MED ORDER — CIPROFLOXACIN HCL 250 MG PO TABS: 250.0000 mg | ORAL_TABLET | Freq: Two times a day (BID) | ORAL | Status: DC

## 2016-03-20 NOTE — Telephone Encounter (Signed)
Called number listed for patient. Per Enid Derry fax Rx to fax number she gave me and they will have it filled. Fax number [phone]. Advised her she did have pharmacy listed,also advised to have facility call back on Tuesday with patients status.Enid Derry agreed.

## 2016-03-20 NOTE — Telephone Encounter (Signed)
Pt bacteria was sensitive to cipro. I any uti symtpoms or if still fatigue could give 3 more days cipro. Give [place] update on how she feels on this coming Tuesday.

## 2016-03-25 ENCOUNTER — Encounter: Payer: Self-pay | Admitting: Medical

## 2016-03-25 ENCOUNTER — Ambulatory Visit (INDEPENDENT_AMBULATORY_CARE_PROVIDER_SITE_OTHER): Payer: Medicare Other | Admitting: Medical

## 2016-03-25 VITALS — BP 116/70 | HR 78 | Temp 98.1°F | Ht 64.0 in | Wt 220.0 lb

## 2016-03-25 DIAGNOSIS — N390 Urinary tract infection, site not specified: Secondary | ICD-10-CM | POA: Diagnosis not present

## 2016-03-25 DIAGNOSIS — R251 Tremor, unspecified: Secondary | ICD-10-CM

## 2016-03-25 NOTE — Patient Instructions (Addendum)
You had uti by culture and I wrote short 3 day course cipro then refilled when I got culture results back. Will put on [facility] summary make sure you finished 2nd short round of antibiotic. It appears extreme fatigue and dizziness cleared up with treatment. Note your decrease kidney function can play a role in some mild daily fatigue.  I will write on your [facility] form to allow you to have plate guard since your tremor is effecting your eating.   Follow up as regularly scheduled with pcp or as needed with myself.

## 2016-03-25 NOTE — Progress Notes (Signed)
Subjective:    Patient ID: Dawn Morrow, female    DOB: [birth year], 80 y.o.   MRN: [id number]  HPI  Pt in for follow up.   On work up for fatigue we did various studies. We did cbc, cmp and tsh. She feels better and seems to have coincided with antibiotic use. Pt did have some decrease gfr. She states still occasionally fatigue but not constant and severe type as before.  Pt also wants guard plate since sometimes she knocks food off her tray. Pt has familiar tremor per her report. States MD told her this. Sometimes this causes her food to get knocked off tray.        Review of Systems  Constitutional: Positive for fatigue. Negative for fever and appetite change.       Much better than last time.   Respiratory: Negative for cough, chest tightness, shortness of breath and wheezing.   Cardiovascular: Negative for chest pain and palpitations.  Gastrointestinal: Negative for abdominal pain.  Genitourinary: Negative for dysuria, urgency, frequency, flank pain, decreased urine volume and difficulty urinating.  Musculoskeletal: Negative for back pain.  Skin: Negative for rash.  Neurological: Negative for dizziness, syncope, facial asymmetry, speech difficulty, weakness and headaches.       Dizziness. Resolved.  Hematological: Negative for adenopathy. Does not bruise/bleed easily.  Psychiatric/Behavioral: Negative for behavioral problems and confusion.    Past Medical History  Diagnosis Date  . Asthma   . COPD (chronic obstructive pulmonary disease) ([place])   . DVT (deep vein thrombosis) in pregnancy     hx x multiple on coumadin  . Pulmonary embolism (HCC)     x 2  . GERD (gastroesophageal reflux disease)   . Hypertension   . Osteopenia   . Hypothyroidism   . Hyperlipidemia   . Osteoarthritis     h/o spinal stenosis by MRI 2009  . Hiatal hernia   . Diverticulosis   . Hemorrhoids   . Tubular adenoma of colon 07/2005  . Anxiety and depression   . Cataract   . Stroke [facility])      MINI  . Diabetes mellitus      Social History   Social History  . Marital Status: Divorced    Spouse Name: N/A  . Number of Children: 3  . Years of Education: N/A   Occupational History  . retired    Social History Main Topics  . Smoking status: Former Smoker    Types: Cigarettes  . Smokeless tobacco: Never Used  . Alcohol Use: No     Comment: socially  . Drug Use: No  . Sexual Activity: Not on file   Other Topics Concern  . Not on file   Social History Narrative   Single,   lost her sister in 2012. 3 children , all live in [place] to [place] late 10-2013   [employer], lives in [place]; nice [place] in [place] [phone], [phone])   Uses a diaper.     Doesn't drive      Past Surgical History  Procedure Laterality Date  . Appendectomy    . Cholecystectomy    . Tubal ligation    . Tonsillectomy    . Lumbar fusion    . Cataract extraction    . Colonoscopy    . Polypectomy      Family History  Problem Relation Age of Onset  . Adopted: Yes  . Cancer Mother     ?  colon or ovarian  . Diabetes Mother   . Cancer Brother     ?  . CAD Neg Hx     Allergies  Allergen Reactions  . Penicillins Anaphylaxis  . Codeine Other (See Comments)    unknown  . Sulfonamide Derivatives Other (See Comments)    unknown    Current Outpatient Prescriptions on File Prior to Visit  Medication Sig Dispense Refill  . albuterol (PROVENTIL HFA;VENTOLIN HFA) 108 (90 BASE) MCG/ACT inhaler Inhale 2 puffs into the lungs every 6 (six) hours as needed for wheezing or shortness of breath. 1 Inhaler 0  . aspirin-acetaminophen-caffeine (EXCEDRIN MIGRAINE) [id number] MG per tablet Take 2 tablets by mouth every 6 (six) hours as needed for headache (headache). Reported on 10/09/2015    . atorvastatin (LIPITOR) 10 MG tablet Take 1 tablet (10 mg total) by mouth at bedtime. 90 tablet 1  . buPROPion (WELLBUTRIN XL) 300 MG 24 hr tablet Take 300 mg by mouth daily.    .  carvedilol (COREG) 12.5 MG tablet Take 1 tablet twice a day (Patient taking differently: Take 12.5 mg by mouth 2 (two) times daily with a meal. ) 180 tablet 3  . denosumab (PROLIA) 60 MG/ML SOLN injection Inject 60 mg into the skin every 6 (six) months. Reported on 10/09/2015    . Dextromethorphan-Guaifenesin (MUCINEX DM) 30-600 MG TB12 Take 1 tablet by mouth 2 (two) times daily as needed (for congestion).     . ezetimibe (ZETIA) 10 MG tablet Take 10 mg by mouth daily.    Marland Kitchen gabapentin (NEURONTIN) 300 MG capsule Take 1 cap in AM, 1 cap at noon, 2 caps at bedtime (Patient taking differently: Take 300-600 mg by mouth 3 (three) times daily. Take 1 cap in AM, 1 cap in afternoon, 2 caps at bedtime.) 120 capsule 6  . guaiFENesin-dextromethorphan (ROBITUSSIN DM) 100-10 MG/5ML syrup Take 5 mLs by mouth every 12 (twelve) hours as needed for cough.    Marland Kitchen HYDROcodone-acetaminophen (NORCO/VICODIN) 5-325 MG per tablet Take 1-2 tablets by mouth every 8 (eight) hours as needed. 30 tablet 0  . levothyroxine (SYNTHROID, LEVOTHROID) 125 MCG tablet Take 125 mcg by mouth daily before breakfast.    . linagliptin (TRADJENTA) 5 MG TABS tablet Take 5 mg by mouth at bedtime.     . meclizine (ANTIVERT) 25 MG tablet Take 1 tablet (25 mg total) by mouth at bedtime. 30 tablet 6  . ondansetron (ZOFRAN-ODT) 4 MG disintegrating tablet Take 1 tablet (4 mg total) by mouth every 8 (eight) hours as needed for nausea or vomiting. 12 tablet 0  . OXYGEN Inhale into the lungs. As directed.    . polyethylene glycol (MIRALAX / GLYCOLAX) packet Take 17 g by mouth daily as needed for mild constipation. Reported on 10/09/2015    . Propylene Glycol (SYSTANE BALANCE) 0.6 % SOLN Place 1 drop into both eyes 3 (three) times daily as needed (for eyes). Reported on 10/09/2015    . repaglinide (PRANDIN) 0.5 MG tablet Take 1 tablet (0.5 mg total) by mouth 3 (three) times daily before meals. 90 tablet 6  . rivaroxaban (XARELTO) 20 MG TABS tablet Take 1 tablet (20  mg total) by mouth daily with supper. 30 tablet 3   Current Facility-Administered Medications on File Prior to Visit  Medication Dose Route Frequency Provider Last Rate Last Dose  . zoledronic acid (RECLAST) injection 5 mg  5 mg Intravenous Once Colon Branch, MD        BP 116/70 mmHg  Pulse 78  Temp(Src) 98.1 F (36.7 C) (Oral)  Ht 5\' 4"  (1.626 m)  Wt 220 lb (99.791 kg)  BMI 37.74 kg/m2  SpO2 90%       Objective:   Physical Exam  General Mental Status- Alert. General Appearance- Not in acute distress.   Skin General: Color- Normal Color. Moisture- Normal Moisture.  Neck Carotid Arteries- Normal color. Moisture- Normal Moisture. No carotid bruits. No JVD.  Chest and Lung Exam Auscultation: Breath Sounds:-Normal.  Cardiovascular Auscultation:Rythm- Regular. Murmurs & Other Heart Sounds:Auscultation of the heart reveals- No Murmurs.  Abdomen Inspection:-Inspeection Normal. Palpation/Percussion:Note:No mass. Palpation and Percussion of the abdomen reveal- Non Tender, Non Distended + BS, no rebound or guarding.    Neurologic Cranial Nerve exam:- CN III-XII intact(No nystagmus), symmetric smile. Strength:- 5/5 equal and symmetric strength both upper and lower extremities.  Back- no cva tenderness.        Assessment & Plan:  You had uti by culture and I wrote short 3 day course cipro then refilled when I got culture results back. Will put on [facility] summary make sure you finished 2nd short round of antibiotic. It appears extreme fatigue and dizziness cleared up with treatment. Note your decrease kidney function can play a role in some mild daily fatigue.  I will write on your [facility] form to allow you to have plate guard since your tremor is effecting your eating. Hand written rx for guard written.   Follow up as regularly scheduled with pcp or as needed with myself.  Sherion Dooly, Percell Miller, PA-C

## 2016-03-25 NOTE — Progress Notes (Signed)
Pre visit review using our clinic review tool, if applicable. No additional management support is needed unless otherwise documented below in the visit note. 

## 2016-04-20 ENCOUNTER — Telehealth: Payer: Self-pay | Admitting: *Deleted

## 2016-04-20 ENCOUNTER — Ambulatory Visit (HOSPITAL_BASED_OUTPATIENT_CLINIC_OR_DEPARTMENT_OTHER)
Admission: RE | Admit: 2016-04-20 | Discharge: 2016-04-20 | Disposition: A | Discharge status: Home / Self Care | Payer: Medicare Other | Source: Ambulatory Visit | Attending: Medical | Admitting: Medical

## 2016-04-20 ENCOUNTER — Encounter: Payer: Self-pay | Admitting: Medical

## 2016-04-20 ENCOUNTER — Ambulatory Visit (INDEPENDENT_AMBULATORY_CARE_PROVIDER_SITE_OTHER): Payer: Medicare Other | Admitting: Medical

## 2016-04-20 VITALS — BP 116/76 | HR 77 | Temp 98.0°F | Ht 64.0 in | Wt 217.6 lb

## 2016-04-20 DIAGNOSIS — M79661 Pain in right lower leg: Secondary | ICD-10-CM

## 2016-04-20 DIAGNOSIS — R5383 Other fatigue: Secondary | ICD-10-CM

## 2016-04-20 DIAGNOSIS — M11261 Other chondrocalcinosis, right knee: Secondary | ICD-10-CM | POA: Insufficient documentation

## 2016-04-20 DIAGNOSIS — Z87440 Personal history of urinary (tract) infections: Secondary | ICD-10-CM

## 2016-04-20 DIAGNOSIS — J984 Other disorders of lung: Secondary | ICD-10-CM | POA: Diagnosis not present

## 2016-04-20 DIAGNOSIS — N184 Chronic kidney disease, stage 4 (severe): Secondary | ICD-10-CM

## 2016-04-20 DIAGNOSIS — I700 Atherosclerosis of aorta: Secondary | ICD-10-CM | POA: Insufficient documentation

## 2016-04-20 DIAGNOSIS — R05 Cough: Secondary | ICD-10-CM | POA: Insufficient documentation

## 2016-04-20 DIAGNOSIS — M25561 Pain in right knee: Secondary | ICD-10-CM

## 2016-04-20 DIAGNOSIS — R059 Cough, unspecified: Secondary | ICD-10-CM

## 2016-04-20 LAB — COMPREHENSIVE METABOLIC PANEL WITH GFR
ALT: 13 U/L (ref 0–35)
AST: 17 U/L (ref 0–37)
Albumin: 3.7 g/dL (ref 3.5–5.2)
Alkaline Phosphatase: 87 U/L (ref 39–117)
BUN: 27 mg/dL — ABNORMAL HIGH (ref 6–23)
CO2: 31 meq/L (ref 19–32)
Calcium: 8.9 mg/dL (ref 8.4–10.5)
Chloride: 100 meq/L (ref 96–112)
Creatinine, Ser: 1.83 mg/dL — ABNORMAL HIGH (ref 0.40–1.20)
GFR: 28.37 mL/min — ABNORMAL LOW
Glucose, Bld: 147 mg/dL — ABNORMAL HIGH (ref 70–99)
Potassium: 4.5 meq/L (ref 3.5–5.1)
Sodium: 139 meq/L (ref 135–145)
Total Bilirubin: 0.5 mg/dL (ref 0.2–1.2)
Total Protein: 7.3 g/dL (ref 6.0–8.3)

## 2016-04-20 LAB — POC URINALSYSI DIPSTICK (AUTOMATED)
Bilirubin, UA: NEGATIVE
Glucose, UA: NEGATIVE
Ketones, UA: NEGATIVE
Nitrite, UA: NEGATIVE
Spec Grav, UA: 1.025
Urobilinogen, UA: NEGATIVE
pH, UA: 6

## 2016-04-20 LAB — CBC WITH DIFFERENTIAL/PLATELET
Basophils Absolute: 0 K/uL (ref 0.0–0.1)
Basophils Relative: 0.2 % (ref 0.0–3.0)
Eosinophils Absolute: 0.1 K/uL (ref 0.0–0.7)
Eosinophils Relative: 1.1 % (ref 0.0–5.0)
HCT: 39.3 % (ref 36.0–46.0)
Hemoglobin: 12.8 g/dL (ref 12.0–15.0)
Lymphocytes Relative: 15.6 % (ref 12.0–46.0)
Lymphs Abs: 1.6 K/uL (ref 0.7–4.0)
MCHC: 32.6 g/dL (ref 30.0–36.0)
MCV: 91.2 fl (ref 78.0–100.0)
Monocytes Absolute: 1.1 K/uL — ABNORMAL HIGH (ref 0.1–1.0)
Monocytes Relative: 10.6 % (ref 3.0–12.0)
Neutro Abs: 7.3 K/uL (ref 1.4–7.7)
Neutrophils Relative %: 72.5 % (ref 43.0–77.0)
Platelets: 179 K/uL (ref 150.0–400.0)
RBC: 4.31 Mil/uL (ref 3.87–5.11)
RDW: 16.5 % — ABNORMAL HIGH (ref 11.5–15.5)
WBC: 10.1 K/uL (ref 4.0–10.5)

## 2016-04-20 MED ORDER — CIPROFLOXACIN HCL 250 MG PO TABS: 250.0000 mg | ORAL_TABLET | Freq: Two times a day (BID) | ORAL | 1 refills | Status: DC

## 2016-04-20 NOTE — Progress Notes (Signed)
Subjective:    Patient ID: Dawn Morrow, female    DOB: 30-May-1936, 80 y.o.   MRN: [id number]  HPI  Pt in for evaluation. Pt states she is feeling fatigued. She states she using walker yesterday and she states legs gave at and she hit her rt knee. Landed on carpet.   Pt had work up for fatigue about 4 weeks ago and she had some low gfr in past. In past mild wbc elevation.  Pt urine culture showed some bacteria in past. In past I gave her cipro. Pt states after she took antibiotic she felt good. Then 2-3 days ago felt tired again.  Pt is on oxygen. Her oxygen does run low.   Review of Systems  Constitutional: Positive for fatigue. Negative for chills and fever.  HENT: Negative for congestion, hearing loss, postnasal drip and sore throat.   Respiratory: Positive for cough. Negative for chest tightness, shortness of breath and wheezing.        Rare cough recently. Not severe.  Cardiovascular: Negative for chest pain and palpitations.  Gastrointestinal: Negative for abdominal pain.  Genitourinary: Negative for dysuria and frequency.  Musculoskeletal: Negative for back pain.       See hpi.  Skin: Negative for rash.  Neurological: Negative for dizziness and headaches.  Hematological: Negative for adenopathy. Does not bruise/bleed easily.  Psychiatric/Behavioral: Negative for behavioral problems and confusion.    Past Medical History:  Diagnosis Date  . Anxiety and depression   . Asthma   . Cataract   . COPD (chronic obstructive pulmonary disease) ([place])   . Diabetes mellitus   . Diverticulosis   . DVT (deep vein thrombosis) in pregnancy    hx x multiple on coumadin  . GERD (gastroesophageal reflux disease)   . Hemorrhoids   . Hiatal hernia   . Hyperlipidemia   . Hypertension   . Hypothyroidism   . Osteoarthritis    h/o spinal stenosis by MRI 2009  . Osteopenia   . Pulmonary embolism (HCC)    x 2  . Stroke [facility])    MINI  . Tubular adenoma of colon 07/2005     Social  History   Social History  . Marital status: Divorced    Spouse name: N/A  . Number of children: 3  . Years of education: N/A   Occupational History  . retired Unemployed   Social History Main Topics  . Smoking status: Former Smoker    Types: Cigarettes  . Smokeless tobacco: Never Used  . Alcohol use No     Comment: socially  . Drug use: No  . Sexual activity: Not on file   Other Topics Concern  . Not on file   Social History Narrative   Single,   lost her sister in 2012. 3 children , all live in [place] to [place] late 10-2013   [employer], lives in [place]; nice [place] in [place] [phone], [phone])   Uses a diaper.     Doesn't drive      Past Surgical History:  Procedure Laterality Date  . APPENDECTOMY    . CATARACT EXTRACTION    . CHOLECYSTECTOMY    . COLONOSCOPY    . LUMBAR FUSION    . POLYPECTOMY    . TONSILLECTOMY    . TUBAL LIGATION      Family History  Problem Relation Age of Onset  . Adopted: Yes  . Cancer Mother     ? colon  or ovarian  . Diabetes Mother   . Cancer Brother     ?  . CAD Neg Hx     Allergies  Allergen Reactions  . Penicillins Anaphylaxis  . Codeine Other (See Comments)    unknown  . Sulfonamide Derivatives Other (See Comments)    unknown    Current Outpatient Prescriptions on File Prior to Visit  Medication Sig Dispense Refill  . albuterol (PROVENTIL HFA;VENTOLIN HFA) 108 (90 BASE) MCG/ACT inhaler Inhale 2 puffs into the lungs every 6 (six) hours as needed for wheezing or shortness of breath. 1 Inhaler 0  . aspirin-acetaminophen-caffeine (EXCEDRIN MIGRAINE) [id number] MG per tablet Take 2 tablets by mouth every 6 (six) hours as needed for headache (headache). Reported on 10/09/2015    . atorvastatin (LIPITOR) 10 MG tablet Take 1 tablet (10 mg total) by mouth at bedtime. 90 tablet 1  . buPROPion (WELLBUTRIN XL) 300 MG 24 hr tablet Take 300 mg by mouth daily.    . carvedilol (COREG) 12.5 MG tablet Take 1  tablet twice a day (Patient taking differently: Take 12.5 mg by mouth 2 (two) times daily with a meal. ) 180 tablet 3  . denosumab (PROLIA) 60 MG/ML SOLN injection Inject 60 mg into the skin every 6 (six) months. Reported on 10/09/2015    . Dextromethorphan-Guaifenesin (MUCINEX DM) 30-600 MG TB12 Take 1 tablet by mouth 2 (two) times daily as needed (for congestion).     . ezetimibe (ZETIA) 10 MG tablet Take 10 mg by mouth daily.    Marland Kitchen gabapentin (NEURONTIN) 300 MG capsule Take 1 cap in AM, 1 cap at noon, 2 caps at bedtime (Patient taking differently: Take 300-600 mg by mouth 3 (three) times daily. Take 1 cap in AM, 1 cap in afternoon, 2 caps at bedtime.) 120 capsule 6  . guaiFENesin-dextromethorphan (ROBITUSSIN DM) 100-10 MG/5ML syrup Take 5 mLs by mouth every 12 (twelve) hours as needed for cough.    Marland Kitchen HYDROcodone-acetaminophen (NORCO/VICODIN) 5-325 MG per tablet Take 1-2 tablets by mouth every 8 (eight) hours as needed. 30 tablet 0  . levothyroxine (SYNTHROID, LEVOTHROID) 125 MCG tablet Take 125 mcg by mouth daily before breakfast.    . linagliptin (TRADJENTA) 5 MG TABS tablet Take 5 mg by mouth at bedtime.     . meclizine (ANTIVERT) 25 MG tablet Take 1 tablet (25 mg total) by mouth at bedtime. 30 tablet 6  . ondansetron (ZOFRAN-ODT) 4 MG disintegrating tablet Take 1 tablet (4 mg total) by mouth every 8 (eight) hours as needed for nausea or vomiting. 12 tablet 0  . OXYGEN Inhale into the lungs. As directed.    . polyethylene glycol (MIRALAX / GLYCOLAX) packet Take 17 g by mouth daily as needed for mild constipation. Reported on 10/09/2015    . Propylene Glycol (SYSTANE BALANCE) 0.6 % SOLN Place 1 drop into both eyes 3 (three) times daily as needed (for eyes). Reported on 10/09/2015    . repaglinide (PRANDIN) 0.5 MG tablet Take 1 tablet (0.5 mg total) by mouth 3 (three) times daily before meals. 90 tablet 6  . rivaroxaban (XARELTO) 20 MG TABS tablet Take 1 tablet (20 mg total) by mouth daily with supper. 30  tablet 3   Current Facility-Administered Medications on File Prior to Visit  Medication Dose Route Frequency Provider Last Rate Last Dose  . zoledronic acid (RECLAST) injection 5 mg  5 mg Intravenous Once Colon Branch, MD        BP 116/76 (BP Location: Left  Arm, Patient Position: Sitting, Cuff Size: Normal)   Pulse 77   Temp 98 F (36.7 C) (Oral)   Ht 5\' 4"  (1.626 m)   Wt 217 lb 9.6 oz (98.7 kg)   SpO2 91%   BMI 37.35 kg/m       Objective:   Physical Exam  General Mental Status- Alert. General Appearance- Not in acute distress. Looks mild tired.  Skin General: Color- Normal Color. Moisture- Normal Moisture.  Neck Carotid Arteries- Normal color. Moisture- Normal Moisture. No carotid bruits. No JVD.  Chest and Lung Exam Auscultation: Breath Sounds:-Normal.  Cardiovascular Auscultation:Rythm- Regular. Murmurs & Other Heart Sounds:Auscultation of the heart reveals- No Murmurs.  Abdomen Inspection:-Inspeection Normal. Palpation/Percussion:Note:No mass. Palpation and Percussion of the abdomen reveal- Non Tender, Non Distended + BS, no rebound or guarding.    Neurologic Cranial Nerve exam:- CN III-XII intact(No nystagmus), symmetric smile. Strength:- 5/5 equal and symmetric strength both upper and lower extremities.  Lower ext- calfs symmetric. Neg homans signs.  Rt knee- faint abrasion over patella and proximal tibia. Faint tender in these areas.      Assessment & Plan:  For your fatigue will get cbc and cmp stat.  For cough and fatigue will get cxr to evaluate if any wallking pneumonia.  For history of fatigue and uti one month ago will repeat  Urine and get culture.  For fall yesterday will get xray of knee and tibia.  Will ask you keep your self hydrated. Will follow stat studies and notify you. If symptoms worsen or change then ED evaluation.  RX treatment to be determined depending on stat results.  Follow up 4 days/this Friday or as needed  Zi Sek,  [place], [place]

## 2016-04-20 NOTE — Progress Notes (Signed)
Pre visit review using our clinic review tool, if applicable. No additional management support is needed unless otherwise documented below in the visit note./HSM  

## 2016-04-20 NOTE — Patient Instructions (Addendum)
For your fatigue will get cbc and cmp stat.  For cough and fatigue will get cxr to evaluate if any wallking pneumonia.  For history of fatigue and uti one month ago will repeat  Urine and get culture.  For fall yesterday will get xray of knee and tibia.  Will ask you keep your self hydrated. Will follow stat studies and notify you. If symptoms worsen or change then ED evaluation.  RX treatment to be determined depending on stat results.  Follow up 4 days/this Friday or as needed

## 2016-04-20 NOTE — Telephone Encounter (Signed)
I sent in cipro to her pharmacy for uti. Was cipro 250 mg bid x 3 days. 1 refill. I should have printed the rx since needs to be sent to [facility]. Will you reprint that rx and fax. Also if they want me to fill out care plan sheet they could fax new one. At time I saw her did not know what plan was until lab work up was done. Will also refer pt to nephrologist to assess decreasing gfr/kidney function.

## 2016-04-20 NOTE — Telephone Encounter (Signed)
Received Provider's Orders paperwork vis fax from [facility].  Placed in folder for Dr. Larose Kells to complete and sign.//AB/CMA

## 2016-04-21 ENCOUNTER — Telehealth: Payer: Self-pay

## 2016-04-21 NOTE — Telephone Encounter (Signed)
Received orders via fax for PT.  Pt had 2 falls on 04/19/16 and has been noted to have tremors in her hands and unsteady sitting balance.  Order placed in Dr. Ethel Rana red folder for review and signature.

## 2016-04-22 LAB — URINE CULTURE: Colony Count: >=100000

## 2016-04-22 MED ORDER — CIPROFLOXACIN HCL 250 MG PO TABS: 250.0000 mg | ORAL_TABLET | Freq: Two times a day (BID) | ORAL | 0 refills | Status: DC

## 2016-04-22 NOTE — Telephone Encounter (Signed)
Rx for Cipro was printed and faxed over to [facility] on 04/22/16.

## 2016-04-22 NOTE — Telephone Encounter (Signed)
Orders signed and faxed back to [facility] at [phone]. Form sent for scanning.

## 2016-04-22 NOTE — Telephone Encounter (Signed)
Caller name: Judson Roch   Relation to pt: Care Transitional Manager from [facility]  Call back number:[phone]   Reason for call:  States partial fax was received requesting orders re fax.

## 2016-04-22 NOTE — Telephone Encounter (Signed)
Orders refaxed.

## 2016-04-22 NOTE — Addendum Note (Signed)
Addended by: Tasia Catchings on: 04/22/2016 11:22 AM   Modules accepted: Orders

## 2016-04-23 ENCOUNTER — Encounter: Payer: Self-pay | Admitting: Medical

## 2016-04-23 ENCOUNTER — Ambulatory Visit: Payer: Medicare Other

## 2016-04-23 ENCOUNTER — Ambulatory Visit (INDEPENDENT_AMBULATORY_CARE_PROVIDER_SITE_OTHER): Payer: Medicare Other | Admitting: Medical

## 2016-04-23 VITALS — BP 82/50 | HR 66 | Temp 97.5°F | Ht 64.0 in | Wt 215.0 lb

## 2016-04-23 DIAGNOSIS — N390 Urinary tract infection, site not specified: Secondary | ICD-10-CM | POA: Diagnosis not present

## 2016-04-23 DIAGNOSIS — R944 Abnormal results of kidney function studies: Secondary | ICD-10-CM | POA: Diagnosis not present

## 2016-04-23 DIAGNOSIS — R5383 Other fatigue: Secondary | ICD-10-CM

## 2016-04-23 MED ORDER — CIPROFLOXACIN HCL 250 MG PO TABS: 250.0000 mg | ORAL_TABLET | Freq: Two times a day (BID) | ORAL | 0 refills | Status: DC

## 2016-04-23 NOTE — Patient Instructions (Signed)
Your blood work looked good the other day except your gfr was low. This could be a part of your fatigue and if worsens may be contributing factor for fatigue in future. I placed referral to nephrologist the other day. Hopefully will get you in within 3 months or sooner.(those appointment are sometimes delayed).  Your Uti likey main contributing factor for acute fatigue. I sent in the cipro the other day but only 3 day rx. Now that we have culture results will give rx for additional 4 days. I want you to be on med total 7 days.  Follow up in 2 weeks or as needed(on follow up want to repeat culture to any bacteria persisting). Also want to repeat cmp/gfr on follow up.

## 2016-04-23 NOTE — Progress Notes (Signed)
Pre visit review using our clinic tool,if applicable. No additional management support is needed unless otherwise documented below in the visit note.   Patient BP low today 82/50 had RN recheck. Provider notified.

## 2016-04-23 NOTE — Progress Notes (Addendum)
   Subjective:    Patient ID: Dawn Morrow, female    DOB: Jun 20, 1936, 80 y.o.   MRN: [id number]  HPI  Pt in for fatigue. I did work up and e coli greater than 100,000 was found. I had tried to give MA a message to fax rx of cipro to pt pharmacy on day pt was seen. I called [facility] to try to make sure medication had been started and if not to get start treatment asap. Pt is not sure if treatment started last night. She does feel better today compared to yesterday. Only faint mild pain urinating yesterday or this am per pt. No back pain. No fever, no chills or sweats.   Review of Systems  Constitutional: Positive for fatigue. Negative for chills and fever.  Respiratory: Negative for cough, chest tightness, shortness of breath and wheezing.   Cardiovascular: Negative for chest pain and palpitations.  Gastrointestinal: Negative for abdominal pain, diarrhea, nausea and vomiting.  Genitourinary: Positive for dysuria. Negative for difficulty urinating, frequency, pelvic pain and urgency.       Some last night or this am per pt.  Musculoskeletal: Negative for back pain.  Neurological: Negative for dizziness.  Hematological: Negative for adenopathy.  Psychiatric/Behavioral: Negative for agitation and confusion.       Objective:   Physical Exam  General- No acute distress. Pleasant patient. Stating some better but not much. Neck- Full range of motion, no jvd Lungs- Clear, even and unlabored. Heart- regular rate and rhythm. Neurologic- CNII- XII grossly intact. Back- no cva pain.   Abdomen- soft,nt, nd, +bs, no rebound or guarding. No suprapubic pressure.       Assessment & Plan:  Your blood work looked good the other day except your gfr was low. This could be a part of your fatigue and if worsens may be contributing factor for fatigue in future. I placed referral to nephrologist the other day. Hopefully will get you in within 3 months or sooner.(those appointment are sometimes  delayed).  Your Uti likey main contributing factor for acute fatigue. I sent in the cipro the other day but only 3 day rx. Now that we have culture results will give rx for additional 4 days. I want you to be on med total 7 days.  Follow up in 2 weeks or as needed(on follow up want to repeat culture to any bacteria persisting). Also want to repeat cmp/gfr on follow up.   Print prescription given to pt. I think this is best way to avoid delay of rx.   Pt bp was little low today compared to other day. I added on pt [facility] form please check bp daily next 3 days. Call us on 04-27-2016 with bp readings.  Shanin Szymanowski, Percell Miller, PA-C

## 2016-04-24 ENCOUNTER — Telehealth: Payer: Self-pay | Admitting: Internal Medicine

## 2016-04-24 NOTE — Telephone Encounter (Signed)
Caller name: Lenna Sciara Relationship to patient: [facility] Can be reached: [phone] Pharmacy:  Reason for call: FYI: Patient will start service 8/214

## 2016-04-28 ENCOUNTER — Telehealth: Payer: Self-pay | Admitting: Internal Medicine

## 2016-04-28 ENCOUNTER — Encounter: Payer: Self-pay | Admitting: Internal Medicine

## 2016-04-28 NOTE — Telephone Encounter (Signed)
Okay to give orders? 

## 2016-04-28 NOTE — Telephone Encounter (Signed)
Error:[id number] ° °

## 2016-04-28 NOTE — Telephone Encounter (Signed)
yes

## 2016-04-28 NOTE — Telephone Encounter (Signed)
LMOM for Dawn Morrow w/ verbal orders. Instructed to call if questions/concerns.

## 2016-04-28 NOTE — Telephone Encounter (Signed)
Caller name:  Beckie Busing  Relation to pt: PT from [facility]  Call back number:  [phone]   Reason for call:  Requesting verbal orders for PT 2x 5x strengthening, safety transfer and gait training.

## 2016-04-29 ENCOUNTER — Telehealth: Payer: Self-pay

## 2016-04-29 NOTE — Telephone Encounter (Signed)
Please call them now and see how she's doing

## 2016-04-29 NOTE — Telephone Encounter (Signed)
Mardene Celeste from [facility] called to inform that she faxed a physician order/advice form earlier today to inform Pt's blood sugar was 516 this AM. Pt admitted to [facility] that she had a [place] before having finger stick because she forgot she was supposed to have finger stick this AM. Mardene Celeste informed Pt wasn't having any high blood sugar symptoms. Blood sugar has not been checked since but would check blood sugar again before dinner and fax [place] the result. Informed her I would let PCP know.

## 2016-04-29 NOTE — Telephone Encounter (Signed)
Spoke w/ Varney Biles at [facility], glucose now 112.

## 2016-04-30 NOTE — Telephone Encounter (Signed)
thx

## 2016-05-04 ENCOUNTER — Telehealth: Payer: Self-pay

## 2016-05-04 NOTE — Telephone Encounter (Signed)
Reviewed and signed by PCP, faxed back to [facility] at [phone]. Forms sent for scanning.

## 2016-05-04 NOTE — Telephone Encounter (Signed)
Received documents via fax.  Numbered and placed in provider's red folder for review and signature.

## 2016-05-05 ENCOUNTER — Ambulatory Visit: Payer: Medicare Other | Admitting: Internal Medicine

## 2016-05-06 ENCOUNTER — Encounter: Payer: Self-pay | Admitting: Internal Medicine

## 2016-05-06 ENCOUNTER — Ambulatory Visit (INDEPENDENT_AMBULATORY_CARE_PROVIDER_SITE_OTHER): Payer: Medicare Other | Admitting: Internal Medicine

## 2016-05-06 VITALS — BP 128/76 | HR 67 | Temp 97.6°F | Resp 14 | Ht 64.0 in | Wt 220.2 lb

## 2016-05-06 DIAGNOSIS — N184 Chronic kidney disease, stage 4 (severe): Secondary | ICD-10-CM

## 2016-05-06 DIAGNOSIS — R5383 Other fatigue: Secondary | ICD-10-CM

## 2016-05-06 DIAGNOSIS — M810 Age-related osteoporosis without current pathological fracture: Secondary | ICD-10-CM | POA: Diagnosis not present

## 2016-05-06 DIAGNOSIS — Z23 Encounter for immunization: Secondary | ICD-10-CM

## 2016-05-06 DIAGNOSIS — E118 Type 2 diabetes mellitus with unspecified complications: Secondary | ICD-10-CM

## 2016-05-06 LAB — HEMOGLOBIN A1C: Hgb A1c MFr Bld: 7.2 % — ABNORMAL HIGH (ref 4.6–6.5)

## 2016-05-06 MED ORDER — DENOSUMAB 60 MG/ML ~~LOC~~ SOLN
60.0000 mg | Freq: Once | SUBCUTANEOUS | Status: AC
  Administered 2016-05-06: 60 mg via SUBCUTANEOUS

## 2016-05-06 NOTE — Progress Notes (Signed)
Pre visit review using our clinic review tool, if applicable. No additional management support is needed unless otherwise documented below in the visit note. 

## 2016-05-06 NOTE — Progress Notes (Signed)
Subjective:    Patient ID: Dawn Morrow, female    DOB: 07-07-1936, 80 y.o.   MRN: [id number]  DOS:  05/06/2016 Type of visit - description :  Interval history:  Since the last visit with me, she was seen here by another provider 4 times, extensive chart review: Was fatigue, lightheaded, urine culture was positive, was prescribed antibiotics, BP was a slightly low. Labs: CBC showed no anemia, CMP showed a creatinine of 1.5, repeated CMP 2 weeks ago show a creatinine of 1.8. TSH was normal. Urine culture 03/26/2016 show Citrobacter,UCX 8-14 -2017 showed Escherichia coli. X-ray right knee show DJD Chest x-ray 2 normal   Review of Systems At this point states that she is finally  feeling excellent for the last 7-8 days. Denies fever chills No cough or sputum production No dysuria, gross hematuria difficulty urinating  Past Medical History:  Diagnosis Date  . Anxiety and depression   . Asthma   . Cataract   . COPD (chronic obstructive pulmonary disease) ([place])   . Diabetes mellitus   . Diverticulosis   . DVT (deep vein thrombosis) in pregnancy    hx x multiple on coumadin  . GERD (gastroesophageal reflux disease)   . Hemorrhoids   . Hiatal hernia   . Hyperlipidemia   . Hypertension   . Hypothyroidism   . Osteoarthritis    h/o spinal stenosis by MRI 2009  . Osteopenia   . Pulmonary embolism (H[facility])    x 2  . Stroke [facility])    MINI  . Tubular adenoma of colon 07/2005    Past Surgical History:  Procedure Laterality Date  . APPENDECTOMY    . CATARACT EXTRACTION    . CHOLECYSTECTOMY    . LUMBAR FUSION    . POLYPECTOMY    . TONSILLECTOMY    . TUBAL LIGATION      Social History   Social History  . Marital status: Divorced    Spouse name: N/A  . Number of children: 3  . Years of education: N/A   Occupational History  . retired Unemployed   Social History Main Topics  . Smoking status: Former Smoker    Types: Cigarettes  . Smokeless tobacco: Never Used  .  Alcohol use No     Comment: socially  . Drug use: No  . Sexual activity: Not on file   Other Topics Concern  . Not on file   Social History Narrative   Single,   lost her sister in 2012. 3 children , all live in [place] to [place] late 10-2013   [employer], lives in [place]; nice [place] in [place] [phone], [phone])   Uses a diaper.     Doesn't drive          Medication List       Accurate as of 05/06/16 11:59 PM. Always use your most recent med list.          albuterol 108 (90 Base) MCG/ACT inhaler Commonly known as:  PROVENTIL HFA;VENTOLIN HFA Inhale 2 puffs into the lungs every 6 (six) hours as needed for wheezing or shortness of breath.   aspirin-acetaminophen-caffeine 250-250-65 MG tablet Commonly known as:  EXCEDRIN MIGRAINE Take 2 tablets by mouth every 6 (six) hours as needed for headache (headache). Reported on 10/09/2015   atorvastatin 10 MG tablet Commonly known as:  LIPITOR Take 1 tablet (10 mg total) by mouth at bedtime.   buPROPion 300 MG 24 hr tablet  Commonly known as:  WELLBUTRIN XL Take 300 mg by mouth daily.   carvedilol 12.5 MG tablet Commonly known as:  COREG Take 1 tablet twice a day   ezetimibe 10 MG tablet Commonly known as:  ZETIA Take 10 mg by mouth daily.   gabapentin 300 MG capsule Commonly known as:  NEURONTIN Take 1 cap in AM, 1 cap at noon, 2 caps at bedtime   HYDROcodone-acetaminophen 5-325 MG tablet Commonly known as:  NORCO/VICODIN Take 1-2 tablets by mouth every 8 (eight) hours as needed.   levothyroxine 125 MCG tablet Commonly known as:  SYNTHROID, LEVOTHROID Take 125 mcg by mouth daily before breakfast.   meclizine 25 MG tablet Commonly known as:  ANTIVERT Take 1 tablet (25 mg total) by mouth at bedtime.   MUCINEX DM 30-600 MG Tb12 Take 1 tablet by mouth 2 (two) times daily as needed (for congestion).   guaiFENesin-dextromethorphan 100-10 MG/5ML syrup Commonly known as:  ROBITUSSIN DM Take 5  mLs by mouth every 12 (twelve) hours as needed for cough.   ondansetron 4 MG disintegrating tablet Commonly known as:  ZOFRAN-ODT Take 1 tablet (4 mg total) by mouth every 8 (eight) hours as needed for nausea or vomiting.   OXYGEN Inhale into the lungs. As directed.   polyethylene glycol packet Commonly known as:  MIRALAX / GLYCOLAX Take 17 g by mouth daily as needed for mild constipation. Reported on 10/09/2015   repaglinide 0.5 MG tablet Commonly known as:  PRANDIN Take 1 tablet (0.5 mg total) by mouth 3 (three) times daily before meals.   rivaroxaban 20 MG Tabs tablet Commonly known as:  XARELTO Take 1 tablet (20 mg total) by mouth daily with supper.   SYSTANE BALANCE 0.6 % Soln Generic drug:  Propylene Glycol Place 1 drop into both eyes 3 (three) times daily as needed (for eyes). Reported on 10/09/2015   TRADJENTA 5 MG Tabs tablet Generic drug:  linagliptin Take 5 mg by mouth at bedtime.          Objective:   Physical Exam BP 128/76 (BP Location: Left Arm, Patient Position: Sitting, Cuff Size: Normal)   Pulse 67   Temp 97.6 F (36.4 C) (Oral)   Resp 14   Ht 5\' 4"  (1.626 m)   Wt 220 lb 4 oz (99.9 kg)   SpO2 97%   BMI 37.81 kg/m  General:   Well developed, well nourished . NAD.  HEENT:  Normocephalic . Face symmetric, atraumatic Lungs:  CTA B Normal respiratory effort, no intercostal retractions, no accessory muscle use. Heart: RRR,  no murmur.  Trace pretibial edema bilaterally  Skin: Not pale. Not jaundice Neurologic:  alert & oriented X3.  Speech at baseline, gait assisted by a walker, and baseline Psych--  Cooperative with normal attention span and concentration.  Behavior appropriate. No anxious or depressed appearing.      Assessment & Plan:   Assessment DM, + Neuropathy DX 01/2016 Hypertension Hypothyroidism Hyperlipidemia Anxiety, depression Chronic renal insufficiency creatinine ~1.3, 1.5 Hematology: on xarelto --Multiple DVTs --PE  2 COPD asthma,home  O2 Neuro: --h/o  stroke --Tremor  GI: --GERD, diverticulosis, HH, hemorrhoids, MSK: --Osteoporosis, normal vitamin D . T score 07-2015: -3.6,  Prolia 10-2015, next 04-2016 --DJD --Spinal stenosis, MRI 2009  PLAN: Fatigue, lightheadedness: Resolved DM: Last A1c 8.4, prandin was added, check A1c. CRI: Creatinine varies over time, last one was 1.8, recheck on RTC Osteoporosis: prolia today UTI: Appropriately treated with Cipro, now asx. Back pain: States pain is mild  and sometimes she just needs Tylenol ordered: done,  see AVS RTC 3 months

## 2016-05-06 NOTE — Patient Instructions (Addendum)
Go to the lab for blood work  Continue with the same medications  If you have mild back pain --- take Tylenol 500 mg 2 tablets every 8 hours as needed  For more severe pain, okay to take hydrocodone which is already prescribed.  Next visit in 3 months. Fasting, CPX

## 2016-05-07 NOTE — Assessment & Plan Note (Signed)
Fatigue, lightheadedness: Resolved DM: Last A1c 8.4, prandin was added, check A1c. CRI: Creatinine varies over time, last one was 1.8, recheck on RTC Osteoporosis: prolia today UTI: Appropriately treated with Cipro, now asx. Back pain: States pain is mild and sometimes she just needs Tylenol ordered: done,  see AVS RTC 3 months

## 2016-05-12 ENCOUNTER — Telehealth: Payer: Self-pay

## 2016-05-12 NOTE — Telephone Encounter (Signed)
Received Home Health Certification and Face to Face encounter form. Form completed and faxed to [facility] at [phone] w/ OV note from 05/06/2016. Form sent for scanning.

## 2016-05-26 LAB — BASIC METABOLIC PANEL
BUN: 19 mg/dL (ref 4–21)
Creatinine: 1.4 mg/dL — AB (ref 0.5–1.1)
Glucose: 115 mg/dL
Potassium: 5.1 mmol/L (ref 3.4–5.3)
Sodium: 141 mmol/L (ref 137–147)

## 2016-06-09 ENCOUNTER — Encounter: Payer: Self-pay | Admitting: Internal Medicine

## 2016-06-15 ENCOUNTER — Telehealth: Payer: Self-pay | Admitting: Internal Medicine

## 2016-06-15 ENCOUNTER — Ambulatory Visit: Payer: Medicare Other | Admitting: Internal Medicine

## 2016-06-15 NOTE — Telephone Encounter (Signed)
No, thx 

## 2016-06-15 NOTE — Telephone Encounter (Signed)
Carmela from assisted living called this morning and stated patient is not feeling well and will not be able to make 1pm appointment today. Patient [facility] to Friday with PA. Charge or no charge.

## 2016-06-16 ENCOUNTER — Telehealth: Payer: Self-pay

## 2016-06-16 NOTE — Telephone Encounter (Signed)
[place] Medicaid program Long term care services form received, signed and faxed back to [facility] at [phone]. Forms sent for scanning.

## 2016-06-19 ENCOUNTER — Ambulatory Visit (INDEPENDENT_AMBULATORY_CARE_PROVIDER_SITE_OTHER): Payer: Medicare Other | Admitting: Medical

## 2016-06-19 ENCOUNTER — Encounter: Payer: Self-pay | Admitting: Medical

## 2016-06-19 VITALS — BP 111/50 | HR 66 | Temp 97.6°F | Ht 64.0 in | Wt 222.0 lb

## 2016-06-19 DIAGNOSIS — R944 Abnormal results of kidney function studies: Secondary | ICD-10-CM

## 2016-06-19 DIAGNOSIS — R3915 Urgency of urination: Secondary | ICD-10-CM

## 2016-06-19 DIAGNOSIS — R8299 Other abnormal findings in urine: Secondary | ICD-10-CM | POA: Diagnosis not present

## 2016-06-19 DIAGNOSIS — R82998 Other abnormal findings in urine: Secondary | ICD-10-CM

## 2016-06-19 LAB — COMPREHENSIVE METABOLIC PANEL
ALT: 13 U/L (ref 0–35)
AST: 15 U/L (ref 0–37)
Albumin: 3.9 g/dL (ref 3.5–5.2)
Alkaline Phosphatase: 83 U/L (ref 39–117)
BUN: 28 mg/dL — ABNORMAL HIGH (ref 6–23)
CO2: 33 mEq/L — ABNORMAL HIGH (ref 19–32)
Calcium: 9.3 mg/dL (ref 8.4–10.5)
Chloride: 104 mEq/L (ref 96–112)
Creatinine, Ser: 1.49 mg/dL — ABNORMAL HIGH (ref 0.40–1.20)
GFR: 35.95 mL/min — ABNORMAL LOW (ref >60.00)
Glucose, Bld: 91 mg/dL (ref 70–99)
Potassium: 4.7 mEq/L (ref 3.5–5.1)
Sodium: 144 mEq/L (ref 135–145)
Total Bilirubin: 0.4 mg/dL (ref 0.2–1.2)
Total Protein: 7.1 g/dL (ref 6.0–8.3)

## 2016-06-19 LAB — POCT URINALYSIS DIPSTICK
Bilirubin, UA: NEGATIVE
Glucose, UA: NEGATIVE
Ketones, UA: NEGATIVE
Nitrite, UA: NEGATIVE
Protein, UA: 0.15
Spec Grav, UA: 1.02
Urobilinogen, UA: >=8
pH, UA: 6

## 2016-06-19 LAB — CBC WITH DIFFERENTIAL/PLATELET
Basophils Absolute: 0.1 10*3/uL (ref 0.0–0.1)
Basophils Relative: 0.5 % (ref 0.0–3.0)
Eosinophils Absolute: 0.4 10*3/uL (ref 0.0–0.7)
Eosinophils Relative: 3.5 % (ref 0.0–5.0)
HCT: 39 % (ref 36.0–46.0)
Hemoglobin: 12.6 g/dL (ref 12.0–15.0)
Lymphocytes Relative: 19.8 % (ref 12.0–46.0)
Lymphs Abs: 2.1 10*3/uL (ref 0.7–4.0)
MCHC: 32.4 g/dL (ref 30.0–36.0)
MCV: 91.6 fl (ref 78.0–100.0)
Monocytes Absolute: 0.7 10*3/uL (ref 0.1–1.0)
Monocytes Relative: 6.4 % (ref 3.0–12.0)
Neutro Abs: 7.5 10*3/uL (ref 1.4–7.7)
Neutrophils Relative %: 69.8 % (ref 43.0–77.0)
Platelets: 204 10*3/uL (ref 150.0–400.0)
RBC: 4.26 Mil/uL (ref 3.87–5.11)
RDW: 16.4 % — ABNORMAL HIGH (ref 11.5–15.5)
WBC: 10.8 10*3/uL — ABNORMAL HIGH (ref 4.0–10.5)

## 2016-06-19 MED ORDER — CIPROFLOXACIN HCL 250 MG PO TABS: 250.0000 mg | ORAL_TABLET | Freq: Two times a day (BID) | ORAL | 0 refills | Status: DC

## 2016-06-19 MED ORDER — MECLIZINE HCL 25 MG PO TABS: 25.0000 mg | ORAL_TABLET | Freq: Every day | ORAL | 6 refills | Status: DC

## 2016-06-19 NOTE — Patient Instructions (Addendum)
You appear to have a urinary tract infection. I am prescribing cipro antibiotic for the probable infection. Giving  low dose and limited number of tabs/tabs  pending  urine culture). Hydrate well. During the interim if your signs and symptoms worsen rather than improving please notify us. We will notify your when the culture results are back.  Also want to get labs today. cmp and cbc.   Follow up in 7 days or as needed.

## 2016-06-19 NOTE — Progress Notes (Signed)
Subjective:    Patient ID: Dawn Morrow, female    DOB: [birth year], 80 y.o.   MRN: [id number]  HPI  Pt in for evaluation.  Pt states she had been urinating some frequency. Some mild pain about one week ago. Pt states she is getting up at night and urinating for one week. Pt states did see specialist. Appears was nephrologist based on referral history.  No fever, no chills, no nausea or vomiting.   Dysuria- one day last week. Frequent urination-yes Hesitancy-no Suprapubic pressure-no Fever-occasoinal subjective. chills-no Nausea-no Vomiting-no CVA pain-no History of UTI-yes Gross hematuria-none  Last culture in august showed ecoli.   Review of Systems  Constitutional: Positive for fever. Negative for chills and fatigue.  Respiratory: Negative for cough, choking, shortness of breath and wheezing.   Cardiovascular: Negative for chest pain and palpitations.  Gastrointestinal: Negative for abdominal pain.  Genitourinary: Positive for frequency. Negative for difficulty urinating, dysuria, flank pain, pelvic pain, urgency and vaginal pain.  Musculoskeletal: Negative for back pain.  Neurological: Negative for dizziness and headaches.  Psychiatric/Behavioral: Negative for agitation, confusion and decreased concentration.   Past Medical History:  Diagnosis Date  . Anxiety and depression   . Asthma   . Cataract   . COPD (chronic obstructive pulmonary disease) ([place])   . Diabetes mellitus   . Diverticulosis   . DVT (deep vein thrombosis) in pregnancy ([facility])    hx x multiple on coumadin  . GERD (gastroesophageal reflux disease)   . Hemorrhoids   . Hiatal hernia   . Hyperlipidemia   . Hypertension   . Hypothyroidism   . Osteoarthritis    h/o spinal stenosis by MRI 2009  . Osteopenia   . Pulmonary embolism ([facility])    x 2  . Stroke [facility])    MINI  . Tubular adenoma of colon 07/2005     Social History   Social History  . Marital status: Divorced    Spouse name: N/A  .  Number of children: 3  . Years of education: N/A   Occupational History  . retired Unemployed   Social History Main Topics  . Smoking status: Former Smoker    Types: Cigarettes  . Smokeless tobacco: Never Used  . Alcohol use No     Comment: socially  . Drug use: No  . Sexual activity: Not on file   Other Topics Concern  . Not on file   Social History Narrative   Single,   lost her sister in 2012. 3 children , all live in [place] to [place] late 10-2013   [employer], lives in [place]; nice [address] in [place] [phone], [phone])   Uses a diaper.     Doesn't drive      Past Surgical History:  Procedure Laterality Date  . APPENDECTOMY    . CATARACT EXTRACTION    . CHOLECYSTECTOMY    . LUMBAR FUSION    . POLYPECTOMY    . TONSILLECTOMY    . TUBAL LIGATION      Family History  Problem Relation Age of Onset  . Adopted: Yes  . Cancer Mother     ? colon or ovarian  . Diabetes Mother   . Cancer Brother     ?  . CAD Neg Hx     Allergies  Allergen Reactions  . Penicillins Anaphylaxis  . Codeine Other (See Comments)    unknown  . Sulfonamide Derivatives Other (See Comments)    unknown  Current Outpatient Prescriptions on File Prior to Visit  Medication Sig Dispense Refill  . albuterol (PROVENTIL HFA;VENTOLIN HFA) 108 (90 BASE) MCG/ACT inhaler Inhale 2 puffs into the lungs every 6 (six) hours as needed for wheezing or shortness of breath. (Patient not taking: Reported on 05/06/2016) 1 Inhaler 0  . aspirin-acetaminophen-caffeine (EXCEDRIN MIGRAINE) 250-250-65 MG per tablet Take 2 tablets by mouth every 6 (six) hours as needed for headache (headache). Reported on 10/09/2015    . atorvastatin (LIPITOR) 10 MG tablet Take 1 tablet (10 mg total) by mouth at bedtime. 90 tablet 1  . buPROPion (WELLBUTRIN XL) 300 MG 24 hr tablet Take 300 mg by mouth daily.    . carvedilol (COREG) 12.5 MG tablet Take 1 tablet twice a day 180 tablet 3  .  Dextromethorphan-Guaifenesin (MUCINEX DM) 30-600 MG TB12 Take 1 tablet by mouth 2 (two) times daily as needed (for congestion).     . ezetimibe (ZETIA) 10 MG tablet Take 10 mg by mouth daily.    Marland Kitchen gabapentin (NEURONTIN) 300 MG capsule Take 1 cap in AM, 1 cap at noon, 2 caps at bedtime (Patient taking differently: Take 300-600 mg by mouth 3 (three) times daily. Take 1 cap in AM, 1 cap in afternoon, 2 caps at bedtime.) 120 capsule 6  . guaiFENesin-dextromethorphan (ROBITUSSIN DM) 100-10 MG/5ML syrup Take 5 mLs by mouth every 12 (twelve) hours as needed for cough.    Marland Kitchen HYDROcodone-acetaminophen (NORCO/VICODIN) 5-325 MG per tablet Take 1-2 tablets by mouth every 8 (eight) hours as needed. 30 tablet 0  . levothyroxine (SYNTHROID, LEVOTHROID) 125 MCG tablet Take 125 mcg by mouth daily before breakfast.    . linagliptin (TRADJENTA) 5 MG TABS tablet Take 5 mg by mouth at bedtime.     . ondansetron (ZOFRAN-ODT) 4 MG disintegrating tablet Take 1 tablet (4 mg total) by mouth every 8 (eight) hours as needed for nausea or vomiting. (Patient not taking: Reported on 05/06/2016) 12 tablet 0  . OXYGEN Inhale into the lungs. As directed.    . polyethylene glycol (MIRALAX / GLYCOLAX) packet Take 17 g by mouth daily as needed for mild constipation. Reported on 10/09/2015    . Propylene Glycol (SYSTANE BALANCE) 0.6 % SOLN Place 1 drop into both eyes 3 (three) times daily as needed (for eyes). Reported on 10/09/2015    . repaglinide (PRANDIN) 0.5 MG tablet Take 1 tablet (0.5 mg total) by mouth 3 (three) times daily before meals. 90 tablet 6  . rivaroxaban (XARELTO) 20 MG TABS tablet Take 1 tablet (20 mg total) by mouth daily with supper. 30 tablet 3   No current facility-administered medications on file prior to visit.     BP (!) 111/50   Pulse 66   Temp 97.6 F (36.4 C) (Oral)   Ht 5\' 4"  (1.626 m)   Wt 222 lb (100.7 kg)   SpO2 100% Comment: On 2L O2  BMI 38.11 kg/m       Objective:   Physical Exam   General  Appearance- Not in acute distress.  HEENT Eyes- Scleraeral/Conjuntiva-bilat- Not Yellow. Mouth & Throat- Normal.  Chest and Lung Exam Auscultation: Breath sounds:-Normal. Adventitious sounds:- No Adventitious sounds.  Cardiovascular Auscultation:Rythm - Regular. Heart Sounds -Normal heart sounds.  Abdomen Inspection:-Inspection Normal.  Palpation/Perucssion: Palpation and Percussion of the abdomen reveal- Non Tender, No Rebound tenderness, No rigidity(Guarding) and No Palpable abdominal masses.  Liver:-Normal.  Spleen:- Normal.   Back- no cva tenderness.     Assessment & Plan:  You  appear to have a urinary tract infection. I am prescribing cipro antibiotic for the probable infection. Giving  low dose and limited number of tabs/tabs  pending  urine culture). Hydrate well. During the interim if your signs and symptoms worsen rather than improving please notify us. We will notify your when the culture results are back.  If culture comes back + consider referring to urologist.    Follow up in 7 days or as needed.  I gave Dr. Fermin Schwab forms that were addressed to him.  Mearl Harewood, Percell Miller, PA-C

## 2016-06-19 NOTE — Progress Notes (Signed)
Pre visit review using our clinic tool,if applicable. No additional management support is needed unless otherwise documented below in the visit note.  

## 2016-06-21 LAB — URINE CULTURE

## 2016-06-22 ENCOUNTER — Telehealth: Payer: Self-pay

## 2016-06-22 ENCOUNTER — Telehealth: Payer: Self-pay | Admitting: Medical

## 2016-06-22 ENCOUNTER — Other Ambulatory Visit: Payer: Self-pay

## 2016-06-22 MED ORDER — CIPROFLOXACIN HCL 500 MG PO TABS: 500.0000 mg | ORAL_TABLET | Freq: Two times a day (BID) | ORAL | 0 refills | Status: DC

## 2016-06-22 NOTE — Telephone Encounter (Signed)
Sent Cipro to pharmacy [facility] uses. [facility]. Changed in system also faxed transmittal to [facility] per coordinator so that they will be able to administer medication to patient.

## 2016-06-22 NOTE — Telephone Encounter (Signed)
Personal Service Plan received from [facility]. Plan reviewed and signed by PCP. Called Carmella at [phone] to inform that form was ready for pick up, however, informed Penelope Coop is no longer w/ [facility]. Nanine Means will pick up at [facility] on Friday. Form placed at front desk. Copy sent for scanning.

## 2016-06-22 NOTE — Telephone Encounter (Signed)
rx cipro sent to her pharmacy.

## 2016-06-23 ENCOUNTER — Telehealth: Payer: Self-pay

## 2016-06-23 NOTE — Telephone Encounter (Signed)
Received form for incontinence supplies from [facility]. Form completed and placed at front desk for pick up on Friday. Copy sent for scanning.

## 2016-06-26 ENCOUNTER — Ambulatory Visit: Payer: Medicare Other | Admitting: Medical

## 2016-07-14 ENCOUNTER — Telehealth: Payer: Self-pay | Admitting: Hematology and Oncology

## 2016-07-14 ENCOUNTER — Encounter: Payer: Self-pay | Admitting: Hematology and Oncology

## 2016-07-14 NOTE — Telephone Encounter (Signed)
Pt resides at [facility] center Judie Petit) confirmed appt, verified insurance, fax referring provider appt date/time

## 2016-07-15 ENCOUNTER — Telehealth: Payer: Self-pay | Admitting: Internal Medicine

## 2016-07-15 MED ORDER — LINAGLIPTIN 5 MG PO TABS: 5.0000 mg | ORAL_TABLET | Freq: Every day | ORAL | 2 refills | Status: DC

## 2016-07-15 NOTE — Telephone Encounter (Signed)
Marguarite Arbour Nanine Means [id number]  Requesting refill for TRADJENTA  She says that it can just be faxed in.

## 2016-07-15 NOTE — Telephone Encounter (Signed)
Rx printed, awaiting MD signature.  

## 2016-07-15 NOTE — Telephone Encounter (Signed)
Spoke w/ Marguarite Arbour, requested [facility] Rx be faxed to [phone].

## 2016-07-22 ENCOUNTER — Telehealth: Payer: Self-pay

## 2016-07-22 NOTE — Telephone Encounter (Signed)
Received Physician order request from [facility]; requesting a divided plate or plate guard. Per PCP unsure what they are requesting and why they are requesting. Form faxed back requesting further information.

## 2016-08-07 ENCOUNTER — Telehealth: Payer: Self-pay

## 2016-08-07 NOTE — Telephone Encounter (Signed)
Diet orders completed by PCP and faxed to [facility] at [phone]. Form sent for scanning.

## 2016-08-13 ENCOUNTER — Ambulatory Visit (HOSPITAL_BASED_OUTPATIENT_CLINIC_OR_DEPARTMENT_OTHER): Payer: Medicare Other | Admitting: Hematology and Oncology

## 2016-08-13 ENCOUNTER — Encounter: Payer: Self-pay | Admitting: Hematology and Oncology

## 2016-08-13 VITALS — BP 114/94 | HR 67 | Temp 97.8°F | Resp 17 | Ht 64.0 in | Wt 218.2 lb

## 2016-08-13 DIAGNOSIS — E119 Type 2 diabetes mellitus without complications: Secondary | ICD-10-CM

## 2016-08-13 DIAGNOSIS — D472 Monoclonal gammopathy: Secondary | ICD-10-CM | POA: Diagnosis present

## 2016-08-13 DIAGNOSIS — N189 Chronic kidney disease, unspecified: Secondary | ICD-10-CM

## 2016-08-13 NOTE — Progress Notes (Signed)
[facility] NOTE  Patient Care Team: Colon Branch, MD as PCP - General Cameron Sprang, MD as Consulting Physician (Neurology) Ladene Artist, MD as Consulting Physician (Gastroenterology) Ruby Cola, MD as Consulting Physician (Otolaryngology) Rexene Agent, MD as Attending Physician (Nephrology)  CHIEF COMPLAINTS/PURPOSE OF CONSULTATION:  Elevated monoclonal protein  HISTORY OF PRESENTING ILLNESS:  Dawn Morrow 80 y.o. female is here because of recent diagnosis of elevated monoclonal protein. Patient has chronic kidney disease probably related to diabetes who had extensive blood work recently by her nephrologist. Blood work revealed that she had 0.2 g of monoclonal protein. She did not have any abnormalities of the Lambda Ratio. Her Serum Calcium Levels and Hemoglobin Levels Have Been Normal. She is brought in today along with her family to discuss the significance of elevated monoclonal protein. She does receive bisphosphonate therapy for osteoporosis. She uses oxygen 24 7 for COPD.  I reviewed her records extensively and collaborated the history with the patient.  MEDICAL HISTORY:  Past Medical History:  Diagnosis Date  . Anxiety and depression   . Asthma   . Cataract   . COPD (chronic obstructive pulmonary disease) ([place])   . Diabetes mellitus   . Diverticulosis   . DVT (deep vein thrombosis) in pregnancy (HCC)    hx x multiple on coumadin  . GERD (gastroesophageal reflux disease)   . Hemorrhoids   . Hiatal hernia   . Hyperlipidemia   . Hypertension   . Hypothyroidism   . Osteoarthritis    h/o spinal stenosis by MRI 2009  . Osteopenia   . Pulmonary embolism (HCC)    x 2  . Stroke [facility])    MINI  . Tubular adenoma of colon 07/2005    SURGICAL HISTORY: Past Surgical History:  Procedure Laterality Date  . APPENDECTOMY    . CATARACT EXTRACTION    . CHOLECYSTECTOMY    . LUMBAR FUSION    . POLYPECTOMY    . TONSILLECTOMY    . TUBAL LIGATION       SOCIAL HISTORY: Social History   Social History  . Marital status: Divorced    Spouse name: N/A  . Number of children: 3  . Years of education: N/A   Occupational History  . retired Unemployed   Social History Main Topics  . Smoking status: Former Smoker    Types: Cigarettes  . Smokeless tobacco: Never Used  . Alcohol use No     Comment: socially  . Drug use: No  . Sexual activity: Not on file   Other Topics Concern  . Not on file   Social History Narrative   Single,   lost her sister in 2012. 3 children , all live in [place] to [place] late 10-2013   [employer], lives in [place]; nice [place] in [place] [phone], [phone])   Uses a diaper.     Doesn't drive      FAMILY HISTORY: Family History  Problem Relation Age of Onset  . Adopted: Yes  . Cancer Mother     ? colon or ovarian  . Diabetes Mother   . Cancer Brother     ?  . CAD Neg Hx     ALLERGIES:  is allergic to penicillins; codeine; and sulfonamide derivatives.  MEDICATIONS:  Current Outpatient Prescriptions  Medication Sig Dispense Refill  . acetaminophen (TYLENOL) 500 MG tablet Take 500 mg by mouth every 6 (six) hours as needed.    Marland Kitchen  albuterol (PROVENTIL HFA;VENTOLIN HFA) 108 (90 BASE) MCG/ACT inhaler Inhale 2 puffs into the lungs every 6 (six) hours as needed for wheezing or shortness of breath. (Patient not taking: Reported on 05/06/2016) 1 Inhaler 0  . aspirin-acetaminophen-caffeine (EXCEDRIN MIGRAINE) 250-250-65 MG per tablet Take 2 tablets by mouth every 6 (six) hours as needed for headache (headache). Reported on 10/09/2015    . atorvastatin (LIPITOR) 10 MG tablet Take 1 tablet (10 mg total) by mouth at bedtime. 90 tablet 1  . buPROPion (WELLBUTRIN XL) 300 MG 24 hr tablet Take 300 mg by mouth daily.    . carvedilol (COREG) 12.5 MG tablet Take 1 tablet twice a day 180 tablet 3  . ciprofloxacin (CIPRO) 500 MG tablet Take 1 tablet (500 mg total) by mouth 2 (two) times daily. 10  tablet 0  . Dextromethorphan-Guaifenesin (MUCINEX DM) 30-600 MG TB12 Take 1 tablet by mouth 2 (two) times daily as needed (for congestion).     . ezetimibe (ZETIA) 10 MG tablet Take 10 mg by mouth daily.    Marland Kitchen ezetimibe (ZETIA) 10 MG tablet Take 10 mg by mouth daily.    Marland Kitchen gabapentin (NEURONTIN) 300 MG capsule Take 1 cap in AM, 1 cap at noon, 2 caps at bedtime (Patient taking differently: Take 300-600 mg by mouth 3 (three) times daily. Take 1 cap in AM, 1 cap in afternoon, 2 caps at bedtime.) 120 capsule 6  . guaiFENesin-dextromethorphan (ROBITUSSIN DM) 100-10 MG/5ML syrup Take 5 mLs by mouth every 12 (twelve) hours as needed for cough.    Marland Kitchen HYDROcodone-acetaminophen (NORCO/VICODIN) 5-325 MG per tablet Take 1-2 tablets by mouth every 8 (eight) hours as needed. 30 tablet 0  . levothyroxine (SYNTHROID, LEVOTHROID) 125 MCG tablet Take 125 mcg by mouth daily before breakfast.    . linagliptin (TRADJENTA) 5 MG TABS tablet Take 1 tablet (5 mg total) by mouth at bedtime. 90 tablet 2  . ondansetron (ZOFRAN-ODT) 4 MG disintegrating tablet Take 1 tablet (4 mg total) by mouth every 8 (eight) hours as needed for nausea or vomiting. (Patient not taking: Reported on 05/06/2016) 12 tablet 0  . OXYGEN Inhale into the lungs. As directed.    . polyethylene glycol (MIRALAX / GLYCOLAX) packet Take 17 g by mouth daily as needed for mild constipation. Reported on 10/09/2015    . Propylene Glycol (SYSTANE BALANCE) 0.6 % SOLN Place 1 drop into both eyes 3 (three) times daily as needed (for eyes). Reported on 10/09/2015    . repaglinide (PRANDIN) 0.5 MG tablet Take 1 tablet (0.5 mg total) by mouth 3 (three) times daily before meals. 90 tablet 6  . rivaroxaban (XARELTO) 20 MG TABS tablet Take 1 tablet (20 mg total) by mouth daily with supper. 30 tablet 3   No current facility-administered medications for this visit.     REVIEW OF SYSTEMS:   Constitutional: Denies fevers, chills or abnormal night sweats Eyes: Denies blurriness  of vision, double vision or watery eyes Ears, nose, mouth, throat, and face: Denies mucositis or sore throat Respiratory: Chronic shortness of breath to exertion and uses oxygen Cardiovascular: Denies palpitation, chest discomfort or lower extremity swelling Gastrointestinal:  Denies nausea, heartburn or change in bowel habits Skin: Denies abnormal skin rashes Lymphatics: Denies new lymphadenopathy or easy bruising Neurological:Denies numbness, tingling or new weaknesses Behavioral/Psych: Mood is stable, no new changes   All other systems were reviewed with the patient and are negative.  PHYSICAL EXAMINATION: ECOG PERFORMANCE STATUS: 1 - Symptomatic but completely ambulatory  Vitals:  08/13/16 1307  BP: (!) 114/94  Pulse: 67  Resp: 17  Temp: 97.8 F (36.6 C)   Filed Weights   08/13/16 1307  Weight: 218 lb 3.2 oz (99 kg)    GENERAL:alert, no distress and comfortable SKIN: skin color, texture, turgor are normal, no rashes or significant lesions EYES: normal, conjunctiva are pink and non-injected, sclera clear OROPHARYNX:no exudate, no erythema and lips, buccal mucosa, and tongue normal  NECK: supple, thyroid normal size, non-tender, without nodularity LYMPH:  no palpable lymphadenopathy in the cervical, axillary or inguinal LUNGS: Diminished breath sounds bilaterally HEART: regular rate & rhythm and no murmurs and no lower extremity edema ABDOMEN:abdomen soft, non-tender and normal bowel sounds Musculoskeletal:no cyanosis of digits and no clubbing  PSYCH: alert & oriented x 3 with fluent speech NEURO: no focal motor/sensory deficits  LABORATORY DATA:  I have reviewed the data as listed Lab Results  Component Value Date   WBC 10.8 (H) 06/19/2016   HGB 12.6 06/19/2016   HCT 39.0 06/19/2016   MCV 91.6 06/19/2016   PLT 204.0 06/19/2016   Lab Results  Component Value Date   NA 144 06/19/2016   K 4.7 06/19/2016   CL 104 06/19/2016   CO2 33 (H) 06/19/2016     RADIOGRAPHIC STUDIES: I have personally reviewed the radiological reports and agreed with the findings in the report.  ASSESSMENT AND PLAN:  MGUS (monoclonal gammopathy of unknown significance) Elevated monoclonal protein 0.2 g (October 2017) Quantitative immunoglobulins: IgG 1089 Kappa: 33, Lambda 31, kappa lambda ratio 1.06 Creatinine 1.43 Calcium 8.7  I discussed with the patient that MGUS is extremely common over the age of 52. It has been reported in about 15% of asymptomatic individuals. The rate of transformation to myeloma is about 1% per year. Patient does not appear to have high risk features. I explained the difference between MGUS and myeloma.  Plan: Follow-up in one year with blood work done ahead of time to monitor this. If she has stable findings then we may allow the patient to be followed by her primary care or nephrology.  Thank you very much for the referral  All questions were answered. The patient knows to call the clinic with any problems, questions or concerns.    Rulon Eisenmenger, MD 08/13/16

## 2016-08-13 NOTE — Assessment & Plan Note (Signed)
Elevated monoclonal protein 0.2 g (October 2017) Quantitative immunoglobulins: IgG 1089 Kappa: 33, Lambda 31, kappa lambda ratio 1.06 Creatinine 1.43 Calcium 8.7  I discussed with the patient that MGUS is extremely common or the age of 40. It has been reported in about 15% of asymptomatic individuals. The rate of transformation to myeloma is about 1% per year. Patient does not appear to have high risk features. I did explain the difference between MGUS and myeloma.  Plan: Follow-up in one year with blood work done ahead of time to monitor this. If she has stable findings then we may allow the patient to be followed by her primary care or nephrology.  Thank you very much for the referral

## 2016-08-25 ENCOUNTER — Other Ambulatory Visit: Payer: Self-pay

## 2016-08-26 ENCOUNTER — Ambulatory Visit: Payer: Medicare Other | Admitting: Internal Medicine

## 2016-08-27 ENCOUNTER — Ambulatory Visit (INDEPENDENT_AMBULATORY_CARE_PROVIDER_SITE_OTHER): Payer: Medicare Other | Admitting: Medical

## 2016-08-27 ENCOUNTER — Telehealth: Payer: Self-pay | Admitting: Internal Medicine

## 2016-08-27 ENCOUNTER — Ambulatory Visit (HOSPITAL_BASED_OUTPATIENT_CLINIC_OR_DEPARTMENT_OTHER)
Admission: RE | Admit: 2016-08-27 | Discharge: 2016-08-27 | Disposition: A | Discharge status: Home / Self Care | Payer: Medicare Other | Source: Ambulatory Visit | Attending: Medical | Admitting: Medical

## 2016-08-27 ENCOUNTER — Encounter: Payer: Self-pay | Admitting: Medical

## 2016-08-27 VITALS — BP 104/60 | HR 57 | Temp 97.5°F | Resp 16 | Ht 64.0 in | Wt 219.2 lb

## 2016-08-27 DIAGNOSIS — J441 Chronic obstructive pulmonary disease with (acute) exacerbation: Secondary | ICD-10-CM

## 2016-08-27 DIAGNOSIS — K1379 Other lesions of oral mucosa: Secondary | ICD-10-CM | POA: Diagnosis not present

## 2016-08-27 DIAGNOSIS — R05 Cough: Secondary | ICD-10-CM

## 2016-08-27 DIAGNOSIS — J209 Acute bronchitis, unspecified: Secondary | ICD-10-CM | POA: Diagnosis not present

## 2016-08-27 DIAGNOSIS — R059 Cough, unspecified: Secondary | ICD-10-CM

## 2016-08-27 DIAGNOSIS — I700 Atherosclerosis of aorta: Secondary | ICD-10-CM | POA: Diagnosis not present

## 2016-08-27 DIAGNOSIS — M47894 Other spondylosis, thoracic region: Secondary | ICD-10-CM | POA: Diagnosis not present

## 2016-08-27 LAB — CBC WITH DIFFERENTIAL/PLATELET
Basophils Absolute: 0 10*3/uL (ref 0.0–0.1)
Basophils Relative: 0.5 % (ref 0.0–3.0)
Eosinophils Absolute: 0.4 10*3/uL (ref 0.0–0.7)
Eosinophils Relative: 4.3 % (ref 0.0–5.0)
HCT: 39 % (ref 36.0–46.0)
Hemoglobin: 12.6 g/dL (ref 12.0–15.0)
Lymphocytes Relative: 21.7 % (ref 12.0–46.0)
Lymphs Abs: 2.1 10*3/uL (ref 0.7–4.0)
MCHC: 32.3 g/dL (ref 30.0–36.0)
MCV: 90.3 fl (ref 78.0–100.0)
Monocytes Absolute: 0.7 10*3/uL (ref 0.1–1.0)
Monocytes Relative: 7.6 % (ref 3.0–12.0)
Neutro Abs: 6.4 10*3/uL (ref 1.4–7.7)
Neutrophils Relative %: 65.9 % (ref 43.0–77.0)
Platelets: 199 10*3/uL (ref 150.0–400.0)
RBC: 4.32 Mil/uL (ref 3.87–5.11)
RDW: 15.9 % — ABNORMAL HIGH (ref 11.5–15.5)
WBC: 9.8 10*3/uL (ref 4.0–10.5)

## 2016-08-27 MED ORDER — BENZONATATE 100 MG PO CAPS: 100.0000 mg | ORAL_CAPSULE | Freq: Three times a day (TID) | ORAL | 0 refills | Status: DC | PRN

## 2016-08-27 MED ORDER — AZITHROMYCIN 250 MG PO TABS: ORAL_TABLET | ORAL | 0 refills | Status: DC

## 2016-08-27 NOTE — Telephone Encounter (Signed)
Caller name:  Seth Bake from [facility]  Relation to pt: Call back [place] fax # [phone] Pharmacy:  Reason for call:  Requesting benzonatate (TESSALON) 100 MG capsule and azithromycin (ZITHROMAX) 250 MG tablet please send to  [facility] [place], [place] [employer]. [phone] (Phone) [phone] (Fax)   Nanine Means is requesting a copy of Rx please fax to [phone]

## 2016-08-27 NOTE — Patient Instructions (Signed)
You appear to have bronchitis. Rest hydrate and tylenol for fever. I am prescribing cough medicine benzonatate, and azithromycin antibiotic.   For copd continue o2 and use albuterol if needed  Please get cbc and cxr today.  Difficult to point out source of blood in your mouth in am. Maybe from lung. Maybe from sinuses. You area on xarelto and have history of dvt's and PE. So at this point benefit exceeds risk. But watch yourself closely if any black or bloody stools, diffuse skin bruising or increased voulmes of blood in mouth then ED evaluation.  Follow up in on Tuesday or as needed

## 2016-08-27 NOTE — Progress Notes (Signed)
Subjective:    Patient ID: Dawn Morrow, female    DOB: 05-Jan-1936, 80 y.o.   MRN: [id number]  HPI  Pt in for recent with some nasal and chest congestion. Pt had this for 2 weeks. Faint productive cough.  No fever,no chills or sweats.   Pt is using oxygen. Has history of copd. No obvious wheezing. No swelling of leg/ no pedal edema.    Pt states mild spitting some blood when she clears throat. Pt denies any black or bloody stools. No bruising seen on her body. Pt is on xarelto.   Pt has history of dvt, PE and greenfied filter insertion history.     Pt has copd and is on oxygen.  No abdomen pain.    Review of Systems  Constitutional: Negative for chills, fatigue and fever.  HENT: Negative for congestion, ear pain, postnasal drip, sinus pain, sinus pressure and sore throat.        Some blood in her mouth in am. Bright red.  Respiratory: Positive for cough. Negative for shortness of breath and wheezing.   Cardiovascular: Negative for chest pain and palpitations.  Gastrointestinal: Negative for abdominal pain.  Musculoskeletal: Negative for back pain.       No leg pain.  Skin: Negative for rash.  Neurological: Negative for dizziness and headaches.  Hematological: Negative for adenopathy. Does not bruise/bleed easily.  Psychiatric/Behavioral: Negative for behavioral problems and confusion.   Past Medical History:  Diagnosis Date  . Anxiety and depression   . Asthma   . Cataract   . COPD (chronic obstructive pulmonary disease) ([place])   . Diabetes mellitus   . Diverticulosis   . DVT (deep vein thrombosis) in pregnancy (HCC)    hx x multiple on coumadin  . GERD (gastroesophageal reflux disease)   . Hemorrhoids   . Hiatal hernia   . Hyperlipidemia   . Hypertension   . Hypothyroidism   . Osteoarthritis    h/o spinal stenosis by MRI 2009  . Osteopenia   . Pulmonary embolism (HCC)    x 2  . Stroke [facility])    MINI  . Tubular adenoma of colon 07/2005     Social History    Social History  . Marital status: Divorced    Spouse name: N/A  . Number of children: 3  . Years of education: N/A   Occupational History  . retired Unemployed   Social History Main Topics  . Smoking status: Former Smoker    Types: Cigarettes  . Smokeless tobacco: Never Used  . Alcohol use No     Comment: socially  . Drug use: No  . Sexual activity: Not on file   Other Topics Concern  . Not on file   Social History Narrative   Single,   lost her sister in 2012. 3 children , all live in [place] to [place] late 10-2013   [employer], lives in [place]; nice [place] in [place] [phone], [phone])   Uses a diaper.     Doesn't drive      Past Surgical History:  Procedure Laterality Date  . APPENDECTOMY    . CATARACT EXTRACTION    . CHOLECYSTECTOMY    . LUMBAR FUSION    . POLYPECTOMY    . TONSILLECTOMY    . TUBAL LIGATION      Family History  Problem Relation Age of Onset  . Adopted: Yes  . Cancer Mother     ? colon  or ovarian  . Diabetes Mother   . Cancer Brother     ?  . CAD Neg Hx     Allergies  Allergen Reactions  . Penicillins Anaphylaxis  . Codeine Other (See Comments)    unknown  . Sulfonamide Derivatives Other (See Comments)    unknown    Current Outpatient Prescriptions on File Prior to Visit  Medication Sig Dispense Refill  . acetaminophen (TYLENOL) 500 MG tablet Take 500 mg by mouth every 6 (six) hours as needed.    Marland Kitchen albuterol (PROVENTIL HFA;VENTOLIN HFA) 108 (90 BASE) MCG/ACT inhaler Inhale 2 puffs into the lungs every 6 (six) hours as needed for wheezing or shortness of breath. 1 Inhaler 0  . aspirin-acetaminophen-caffeine (EXCEDRIN MIGRAINE) [id number] MG per tablet Take 2 tablets by mouth every 6 (six) hours as needed for headache (headache). Reported on 10/09/2015    . atorvastatin (LIPITOR) 10 MG tablet Take 1 tablet (10 mg total) by mouth at bedtime. 90 tablet 1  . buPROPion (WELLBUTRIN XL) 300 MG 24 hr tablet Take 300  mg by mouth daily.    . carvedilol (COREG) 12.5 MG tablet Take 1 tablet twice a day 180 tablet 3  . Dextromethorphan-Guaifenesin (MUCINEX DM) 30-600 MG TB12 Take 1 tablet by mouth 2 (two) times daily as needed (for congestion).     . ezetimibe (ZETIA) 10 MG tablet Take 10 mg by mouth daily.    Marland Kitchen gabapentin (NEURONTIN) 300 MG capsule Take 1 cap in AM, 1 cap at noon, 2 caps at bedtime (Patient taking differently: Take 300-600 mg by mouth 3 (three) times daily. Take 1 cap in AM, 1 cap in afternoon, 2 caps at bedtime.) 120 capsule 6  . guaiFENesin-dextromethorphan (ROBITUSSIN DM) 100-10 MG/5ML syrup Take 5 mLs by mouth every 12 (twelve) hours as needed for cough.    Marland Kitchen HYDROcodone-acetaminophen (NORCO/VICODIN) 5-325 MG per tablet Take 1-2 tablets by mouth every 8 (eight) hours as needed. 30 tablet 0  . levothyroxine (SYNTHROID, LEVOTHROID) 125 MCG tablet Take 125 mcg by mouth daily before breakfast.    . linagliptin (TRADJENTA) 5 MG TABS tablet Take 1 tablet (5 mg total) by mouth at bedtime. 90 tablet 2  . ondansetron (ZOFRAN-ODT) 4 MG disintegrating tablet Take 1 tablet (4 mg total) by mouth every 8 (eight) hours as needed for nausea or vomiting. 12 tablet 0  . OXYGEN Inhale into the lungs. As directed.    . polyethylene glycol (MIRALAX / GLYCOLAX) packet Take 17 g by mouth daily as needed for mild constipation. Reported on 10/09/2015    . Propylene Glycol (SYSTANE BALANCE) 0.6 % SOLN Place 1 drop into both eyes 3 (three) times daily as needed (for eyes). Reported on 10/09/2015    . repaglinide (PRANDIN) 0.5 MG tablet Take 1 tablet (0.5 mg total) by mouth 3 (three) times daily before meals. 90 tablet 6  . rivaroxaban (XARELTO) 20 MG TABS tablet Take 1 tablet (20 mg total) by mouth daily with supper. 30 tablet 3  . ciprofloxacin (CIPRO) 500 MG tablet Take 1 tablet (500 mg total) by mouth 2 (two) times daily. (Patient not taking: Reported on 08/27/2016) 10 tablet 0   No current facility-administered  medications on file prior to visit.     BP 104/60 (BP Location: Left Arm, Patient Position: Sitting, Cuff Size: Large)   Pulse (!) 57   Temp 97.5 F (36.4 C) (Oral)   Resp 16   Ht 5\' 4"  (1.626 m)   Wt 219 lb 3.2  oz (99.4 kg)   SpO2 94%   PF (!) 2 L/min   BMI 37.63 kg/m       Objective:   Physical Exam  General  Mental Status - Alert. General Appearance - Well groomed. Not in acute distress.  Skin Rashes- No Rashes.  HEENT Head- Normal. Ear Auditory Canal - Left- mild wax left side. Right - wax obstructionTympanic Membrane- Left- Normal. Right- Normal.(wax cleared completely post lavage) Eye Sclera/Conjunctiva- Left- Normal. Right- Normal. Nose & Sinuses Nasal Mucosa- Left-  Boggy and Congested. Right-  Boggy and  Congested.Bilateral  No maxillary and no  frontal sinus pressure. Mouth & Throat Lips: Upper Lip- Normal: no dryness, cracking, pallor, cyanosis, or vesicular eruption. Lower Lip-Normal: no dryness, cracking, pallor, cyanosis or vesicular eruption. Buccal Mucosa- Bilateral- No Aphthous ulcers. Oropharynx- No Discharge or Erythema. Tonsils: Characteristics- Bilateral- No Erythema or Congestion. Size/Enlargement- Bilateral- No enlargement. Discharge- bilateral-None.  Neck Neck- Supple. No Masses.   Chest and Lung Exam Auscultation: Breath Sounds:-Clear even and unlabored.  Cardiovascular Auscultation:Rythm- Regular, rate and rhythm. Murmurs & Other Heart Sounds:Ausculatation of the heart reveal- No Murmurs.  Lymphatic Head & Neck General Head & Neck Lymphatics: Bilateral: Description- No Localized lymphadenopathy.  Lower ext- no pedal edema.  Skin- no bruising seen.      Assessment & Plan:  You appear to have bronchitis. Rest hydrate and tylenol for fever. I am prescribing cough medicine benzonatate, and azithromycin antibiotic.   For copd continue O2 and use albuterol if needed  Please get cbc and cxr today.  Difficult to point out source  of blood in your mouth in am. Maybe from lung. Maybe from sinuses. You area on xarelto and have history of dvt's and PE. So at this point benefit exceeds risk. But watch yourself closely if any black or bloody stools, diffuse skin bruising or increased voulmes of blood in mouth then ED evaluation.  Follow up in on Tuesday or as needed  Davionna Blacksher, [place], [place]

## 2016-08-28 ENCOUNTER — Telehealth: Payer: Self-pay | Admitting: Medical

## 2016-08-28 ENCOUNTER — Other Ambulatory Visit: Payer: Self-pay | Admitting: *Deleted

## 2016-08-28 MED ORDER — AZITHROMYCIN 250 MG PO TABS: ORAL_TABLET | ORAL | 0 refills | Status: DC

## 2016-08-28 MED ORDER — BENZONATATE 100 MG PO CAPS: 100.0000 mg | ORAL_CAPSULE | Freq: Three times a day (TID) | ORAL | 0 refills | Status: DC | PRN

## 2016-08-28 NOTE — Telephone Encounter (Signed)
Zofran dc. Start the zpack and benzonatate.Ivin Booty called [facility].

## 2016-08-28 NOTE — Telephone Encounter (Signed)
Dawn Morrow called stating that the pharmacy told her the patient's benzonatate (TESSALON) 100 MG capsule Is not covered by insurance and they would like to see if there is a different medication that can be prescribed. Please advise  Pharmacy: [facility], [place].

## 2016-08-28 NOTE — Progress Notes (Signed)
Phineas Semen called from [facility] stating that there was a potential drug-interaction between Dawn Morrow [px 08/27/16, resent today] and Zofran [px 03/18/16 #12x0]. Check in Epic system; there was no interaction given, but the Zofran was to be for one-time px in July 2017. Spoke with Phineas Semen and gave her this information and per provider VO, Zofran is D/C and should no longer be given to patient unless px by PCP [Dr. Paz], understood &agreed/SLS 12/22

## 2016-08-28 NOTE — Telephone Encounter (Signed)
[facility] to verify that pt had not p/u Rx[s] and to cancel d/t patient's living facility request to have Rx[s] faxed to them; printed scripts and faxed to [place] at [facility] attention, as requested/SLS 12/22

## 2016-08-28 NOTE — Telephone Encounter (Signed)
Pt seen by Edward-forwarding.

## 2016-09-01 ENCOUNTER — Telehealth: Payer: Self-pay | Admitting: Internal Medicine

## 2016-09-01 ENCOUNTER — Ambulatory Visit: Payer: Medicare Other | Admitting: Internal Medicine

## 2016-09-01 NOTE — Telephone Encounter (Signed)
Caller name: Arville Go Relationship to patient: Niece Can be reached: [phone] Pharmacy:  Reason for call: Request results from X-Ray

## 2016-09-01 NOTE — Telephone Encounter (Signed)
Spoke w/ Hardie Shackleton (new DPR signed on 08/27/2016) informed of x-ray results.

## 2016-09-02 ENCOUNTER — Ambulatory Visit (INDEPENDENT_AMBULATORY_CARE_PROVIDER_SITE_OTHER): Payer: Medicare Other | Admitting: Medical

## 2016-09-02 VITALS — BP 147/74 | HR 68 | Temp 97.9°F | Ht 64.0 in | Wt 221.4 lb

## 2016-09-02 DIAGNOSIS — J209 Acute bronchitis, unspecified: Secondary | ICD-10-CM | POA: Diagnosis not present

## 2016-09-02 NOTE — Progress Notes (Signed)
Pre visit review using our clinic tool,if applicable. No additional management support is needed unless otherwise documented below in the visit note.  

## 2016-09-02 NOTE — Progress Notes (Signed)
Subjective:    Patient ID: Dawn Morrow, female    DOB: 01-10-1936, 80 y.o.   MRN: [id number]  HPI  Pt is stats feeling better overall. 60-70%. Pt treated with zpack for bronchitis. Pt cbc and cxr looked good on last visit.   Pt still on her oxygen. 96% today.  Pt no longer has any reported blood from her mouth in am.   Pt had flu vaccine this year.  No fever, no chills, no sweats, no sinus pressure, no cough and chest congestion better.        Review of Systems  Constitutional: Negative for chills, fatigue and fever.       But improving.  HENT: Negative for congestion, mouth sores, nosebleeds, postnasal drip, rhinorrhea, sinus pain, sinus pressure, sneezing and sore throat.        Faint hoarse voice.  Respiratory: Negative for cough, chest tightness, shortness of breath and wheezing.        Did not cough today on exam.  Cardiovascular: Negative for chest pain and palpitations.  Gastrointestinal: Negative for abdominal pain.  Genitourinary: Negative for decreased urine volume, dysuria, enuresis and flank pain.  Musculoskeletal: Negative for back pain and myalgias.  Skin: Negative for rash.  Neurological: Negative for dizziness and headaches.  Hematological: Negative for adenopathy. Does not bruise/bleed easily.  Psychiatric/Behavioral: Negative for agitation and confusion.   Past Medical History:  Diagnosis Date  . Anxiety and depression   . Asthma   . Cataract   . COPD (chronic obstructive pulmonary disease) ([place])   . Diabetes mellitus   . Diverticulosis   . DVT (deep vein thrombosis) in pregnancy (HCC)    hx x multiple on coumadin  . GERD (gastroesophageal reflux disease)   . Hemorrhoids   . Hiatal hernia   . Hyperlipidemia   . Hypertension   . Hypothyroidism   . Osteoarthritis    h/o spinal stenosis by MRI 2009  . Osteopenia   . Pulmonary embolism (HCC)    x 2  . Stroke [facility])    MINI  . Tubular adenoma of colon 07/2005     Social History   Social  History  . Marital status: Divorced    Spouse name: N/A  . Number of children: 3  . Years of education: N/A   Occupational History  . retired Unemployed   Social History Main Topics  . Smoking status: Former Smoker    Types: Cigarettes  . Smokeless tobacco: Never Used  . Alcohol use No     Comment: socially  . Drug use: No  . Sexual activity: Not on file   Other Topics Concern  . Not on file   Social History Narrative   Single,   lost her sister in 2012. 3 children , all live in [place] to [place] late 10-2013   Nephew Joe Cells, lives in [place]; niece [name] in [place] [phone], [phone])   Doesn't drive      Past Surgical History:  Procedure Laterality Date  . APPENDECTOMY    . CATARACT EXTRACTION    . CHOLECYSTECTOMY    . LUMBAR FUSION    . POLYPECTOMY    . TONSILLECTOMY    . TUBAL LIGATION      Family History  Problem Relation Age of Onset  . Adopted: Yes  . Cancer Mother     ? colon or ovarian  . Diabetes Mother   . Cancer Brother     ?  Marland Kitchen  CAD Neg Hx     Allergies  Allergen Reactions  . Penicillins Anaphylaxis  . Codeine Other (See Comments)    unknown  . Sulfonamide Derivatives Other (See Comments)    unknown    Current Outpatient Prescriptions on File Prior to Visit  Medication Sig Dispense Refill  . acetaminophen (TYLENOL) 500 MG tablet Take 500 mg by mouth every 6 (six) hours as needed.    Marland Kitchen albuterol (PROVENTIL HFA;VENTOLIN HFA) 108 (90 BASE) MCG/ACT inhaler Inhale 2 puffs into the lungs every 6 (six) hours as needed for wheezing or shortness of breath. 1 Inhaler 0  . aspirin-acetaminophen-caffeine (EXCEDRIN MIGRAINE) [id number] MG per tablet Take 2 tablets by mouth every 6 (six) hours as needed for headache (headache). Reported on 10/09/2015    . atorvastatin (LIPITOR) 10 MG tablet Take 1 tablet (10 mg total) by mouth at bedtime. 90 tablet 1  . azithromycin (ZITHROMAX) 250 MG tablet Take 2 tablets by mouth on day 1, followed by  1 tablet by mouth daily for 4 days. 6 each 0  . benzonatate (TESSALON) 100 MG capsule Take 1 capsule (100 mg total) by mouth 3 (three) times daily as needed for cough. 21 capsule 0  . buPROPion (WELLBUTRIN XL) 300 MG 24 hr tablet Take 300 mg by mouth daily.    . carvedilol (COREG) 12.5 MG tablet Take 1 tablet twice a day 180 tablet 3  . ciprofloxacin (CIPRO) 500 MG tablet Take 1 tablet (500 mg total) by mouth 2 (two) times daily. 10 tablet 0  . Dextromethorphan-Guaifenesin (MUCINEX DM) 30-600 MG TB12 Take 1 tablet by mouth 2 (two) times daily as needed (for congestion).     . ezetimibe (ZETIA) 10 MG tablet Take 10 mg by mouth daily.    Marland Kitchen gabapentin (NEURONTIN) 300 MG capsule Take 1 cap in AM, 1 cap at noon, 2 caps at bedtime (Patient taking differently: Take 300-600 mg by mouth 3 (three) times daily. Take 1 cap in AM, 1 cap in afternoon, 2 caps at bedtime.) 120 capsule 6  . guaiFENesin-dextromethorphan (ROBITUSSIN DM) 100-10 MG/5ML syrup Take 5 mLs by mouth every 12 (twelve) hours as needed for cough.    Marland Kitchen HYDROcodone-acetaminophen (NORCO/VICODIN) 5-325 MG per tablet Take 1-2 tablets by mouth every 8 (eight) hours as needed. 30 tablet 0  . levothyroxine (SYNTHROID, LEVOTHROID) 125 MCG tablet Take 125 mcg by mouth daily before breakfast.    . linagliptin (TRADJENTA) 5 MG TABS tablet Take 1 tablet (5 mg total) by mouth at bedtime. 90 tablet 2  . OXYGEN Inhale into the lungs. As directed.    . polyethylene glycol (MIRALAX / GLYCOLAX) packet Take 17 g by mouth daily as needed for mild constipation. Reported on 10/09/2015    . Propylene Glycol (SYSTANE BALANCE) 0.6 % SOLN Place 1 drop into both eyes 3 (three) times daily as needed (for eyes). Reported on 10/09/2015    . repaglinide (PRANDIN) 0.5 MG tablet Take 1 tablet (0.5 mg total) by mouth 3 (three) times daily before meals. 90 tablet 6  . rivaroxaban (XARELTO) 20 MG TABS tablet Take 1 tablet (20 mg total) by mouth daily with supper. 30 tablet 3   No  current facility-administered medications on file prior to visit.     BP (!) 147/74   Pulse 68   Temp 97.9 F (36.6 C) (Oral)   Ht 5\' 4"  (1.626 m)   Wt 221 lb 6.4 oz (100.4 kg)   SpO2 96%   BMI 38.00 kg/m  Objective:   Physical Exam   General  Mental Status - Alert. General Appearance - Well groomed. Not in acute distress.  Skin Rashes- No Rashes.  HEENT Head- Normal. Ear Auditory Canal - Left- Normal. Right - Normal.Tympanic Membrane- Left- Normal. Right- Normal. Eye Sclera/Conjunctiva- Left- Normal. Right- Normal. Nose & Sinuses Nasal Mucosa- Left-  Boggy and Congested. Right-  Boggy and  Congested.Bilateral  No maxillary and  No frontal sinus pressure. Mouth & Throat Lips: Upper Lip- Normal: no dryness, cracking, pallor, cyanosis, or vesicular eruption. Lower Lip-Normal: no dryness, cracking, pallor, cyanosis or vesicular eruption. Buccal Mucosa- Bilateral- No Aphthous ulcers. Oropharynx- No Discharge or Erythema. +pnd Tonsils: Characteristics- Bilateral- No Erythema or Congestion. Size/Enlargement- Bilateral- No enlargement. Discharge- bilateral-None.  Neck Neck- Supple. No Masses.   Chest and Lung Exam Auscultation: Breath Sounds:-Clear even and unlabored.  Cardiovascular Auscultation:Rythm- Regular, rate and rhythm. Murmurs & Other Heart Sounds:Ausculatation of the heart reveal- No Murmurs.  Lymphatic Head & Neck General Head & Neck Lymphatics: Bilateral: Description- No Localized lymphadenopathy.      Assessment & Plan:  Your bronchitis appears resolved(Pt looks a lot better now). No further antibiotic needed.Can stop benzonatate.(dc on her med list)  Stay on oxygen.   If recurrent bronchitis symptoms let us know.  Schedule follow up with Dr. Larose Kells in 3-4 months or as needed

## 2016-09-02 NOTE — Progress Notes (Signed)
0

## 2016-09-02 NOTE — Patient Instructions (Signed)
Your bronchitis appears resolved. No further antibiotic needed.Can stop benzonatate.(dc on her med list)  Stay on oxygen.   If recurrent bronchitis symptoms let us know.  Schedule follow up with Dr. Larose Kells in 3-4 months or as needed

## 2016-09-03 ENCOUNTER — Telehealth: Payer: Self-pay

## 2016-09-03 NOTE — Telephone Encounter (Signed)
Called patient, someone by the name of Tesha answered. Advised patient is not due to have Prolia injection until 10/2016. States they will let patient know.

## 2016-09-03 NOTE — Telephone Encounter (Signed)
-----   Message from [place], [place] sent at 09/02/2016  3:47 PM EST ----- Last Prolia 05/06/2016-next not until 12/05/2015.  ----- Message ----- From: Bunnie Domino, LPN Sent: [phone]   3:36 PM To: Damita Dunnings, CMA  Do you all have any orders for the above patient regarding Prolia injections. States she is due.

## 2016-10-20 ENCOUNTER — Telehealth: Payer: Self-pay | Admitting: Internal Medicine

## 2016-10-20 NOTE — Telephone Encounter (Signed)
Prolia benefits verified No PA required Patient has dual coverage an has [employer] Deductible. Patient could owe approximately $220 out of pocket

## 2016-11-19 NOTE — Telephone Encounter (Signed)
Patient was due for Prolia 11/02/16, have we notified her to schedule?

## 2016-11-20 NOTE — Telephone Encounter (Signed)
Pt in assisted living at [facility], will mail letter informing Pt and family that she is due for PROLIA injection to call office to schedule an appt.

## 2016-12-01 ENCOUNTER — Ambulatory Visit (INDEPENDENT_AMBULATORY_CARE_PROVIDER_SITE_OTHER): Payer: Medicare Other | Admitting: Behavioral Health

## 2016-12-01 DIAGNOSIS — M810 Age-related osteoporosis without current pathological fracture: Secondary | ICD-10-CM

## 2016-12-01 MED ORDER — DENOSUMAB 60 MG/ML ~~LOC~~ SOLN
60.0000 mg | Freq: Once | SUBCUTANEOUS | Status: AC
  Administered 2016-12-01: 60 mg via SUBCUTANEOUS

## 2016-12-01 NOTE — Progress Notes (Addendum)
Pre visit review using our clinic review tool, if applicable. No additional management support is needed unless otherwise documented below in the visit note.  Patient in clinic today for Prolia injection. SQ injection given in left arm. Patient tolerated injection well.  Kathlene November, MD

## 2016-12-21 ENCOUNTER — Telehealth: Payer: Self-pay | Admitting: Internal Medicine

## 2016-12-21 DIAGNOSIS — Z0279 Encounter for issue of other medical certificate: Secondary | ICD-10-CM

## 2016-12-21 NOTE — Telephone Encounter (Signed)
Jazmine with [employer] brought a Scheduled Plan of Care form to be filled out by Dr. Larose Kells. Please call them at [phone] when the form is ready for them to come by and pick it up. Form placed in bin.

## 2016-12-29 NOTE — Telephone Encounter (Signed)
Spoke w/ Jazmine, informed that forms have been completed and placed at front desk for pick up. Copy of forms sent for scanning.

## 2017-02-04 ENCOUNTER — Telehealth: Payer: Self-pay | Admitting: *Deleted

## 2017-02-04 NOTE — Telephone Encounter (Signed)
Received Physician Orders from [place], forwarded to provider/SLS 05/31

## 2017-02-08 ENCOUNTER — Encounter: Payer: Self-pay | Admitting: Internal Medicine

## 2017-02-08 NOTE — Telephone Encounter (Signed)
error:[id number] ° °

## 2017-02-16 ENCOUNTER — Ambulatory Visit (INDEPENDENT_AMBULATORY_CARE_PROVIDER_SITE_OTHER): Payer: Medicare Other | Admitting: Internal Medicine

## 2017-02-16 ENCOUNTER — Encounter: Payer: Self-pay | Admitting: Internal Medicine

## 2017-02-16 VITALS — BP 126/78 | HR 51 | Temp 97.9°F | Resp 14 | Ht 64.0 in | Wt 217.1 lb

## 2017-02-16 DIAGNOSIS — I10 Essential (primary) hypertension: Secondary | ICD-10-CM

## 2017-02-16 DIAGNOSIS — E785 Hyperlipidemia, unspecified: Secondary | ICD-10-CM

## 2017-02-16 DIAGNOSIS — E034 Atrophy of thyroid (acquired): Secondary | ICD-10-CM

## 2017-02-16 DIAGNOSIS — E1159 Type 2 diabetes mellitus with other circulatory complications: Secondary | ICD-10-CM | POA: Diagnosis not present

## 2017-02-16 LAB — BASIC METABOLIC PANEL
BUN: 24 mg/dL — ABNORMAL HIGH (ref 6–23)
CO2: 33 mEq/L — ABNORMAL HIGH (ref 19–32)
Calcium: 9.4 mg/dL (ref 8.4–10.5)
Chloride: 104 mEq/L (ref 96–112)
Creatinine, Ser: 1.47 mg/dL — ABNORMAL HIGH (ref 0.40–1.20)
GFR: 36.45 mL/min — ABNORMAL LOW (ref >60.00)
Glucose, Bld: 138 mg/dL — ABNORMAL HIGH (ref 70–99)
Potassium: 5 mEq/L (ref 3.5–5.1)
Sodium: 143 mEq/L (ref 135–145)

## 2017-02-16 LAB — MICROALBUMIN / CREATININE URINE RATIO
Creatinine,U: 156.6 mg/dL
Microalb Creat Ratio: 2.8 mg/g (ref 0.0–30.0)
Microalb, Ur: 4.4 mg/dL — ABNORMAL HIGH (ref 0.0–1.9)

## 2017-02-16 LAB — LIPID PANEL
Cholesterol: 136 mg/dL (ref 0–200)
HDL: 40.4 mg/dL (ref >39.00)
LDL Cholesterol: 76 mg/dL (ref 0–99)
NonHDL: 95.55
Total CHOL/HDL Ratio: 3
Triglycerides: 98 mg/dL (ref 0.0–149.0)
VLDL: 19.6 mg/dL (ref 0.0–40.0)

## 2017-02-16 LAB — HEMOGLOBIN A1C: Hgb A1c MFr Bld: 6.8 % — ABNORMAL HIGH (ref 4.6–6.5)

## 2017-02-16 LAB — TSH: TSH: 0.38 u[IU]/mL (ref 0.35–4.50)

## 2017-02-16 NOTE — Progress Notes (Signed)
Subjective:    Patient ID: Dawn Morrow, female    DOB: 11-29-1935, 81 y.o.   MRN: [id number]  DOS:  02/16/2017 Type of visit - description : rov Interval history:  Here with a caregiver from [place] DM: Good compliance of medication, CBGs are excellent in the low 100s HTN: Good compliance of medication, ambulatory BPs in the 120/60s, occasionally BP is lower. Anxiety depression: Well control, good med compliance Multiple PEs, hypoxemia: On Xarelto, good compliance with oxygen.   Review of Systems Denies chest pain, difficulty breathing at baseline No nausea, vomiting, diarrhea or blood in the stools. Occasional sputum production.  Past Medical History:  Diagnosis Date  . Anxiety and depression   . Asthma   . Cataract   . COPD (chronic obstructive pulmonary disease) ([place])   . Diabetes mellitus   . Diverticulosis   . DVT (deep vein thrombosis) in pregnancy (HCC)    hx x multiple on coumadin  . GERD (gastroesophageal reflux disease)   . Hemorrhoids   . Hiatal hernia   . Hyperlipidemia   . Hypertension   . Hypothyroidism   . Osteoarthritis    h/o spinal stenosis by MRI 2009  . Osteopenia   . Pulmonary embolism (HCC)    x 2  . Stroke [facility])    MINI  . Tubular adenoma of colon 07/2005    Past Surgical History:  Procedure Laterality Date  . APPENDECTOMY    . CATARACT EXTRACTION    . CHOLECYSTECTOMY    . LUMBAR FUSION    . POLYPECTOMY    . TONSILLECTOMY    . TUBAL LIGATION      Social History   Social History  . Marital status: Divorced    Spouse name: N/A  . Number of children: 3  . Years of education: N/A   Occupational History  . retired Unemployed   Social History Main Topics  . Smoking status: Former Smoker    Types: Cigarettes  . Smokeless tobacco: Never Used  . Alcohol use No     Comment: socially  . Drug use: No  . Sexual activity: Not on file   Other Topics Concern  . Not on file   Social History Narrative   Single,   lost her sister  in 2012. 3 children , all live in [place] to [place] late 10-2013   Nephew Joe Cells, lives in [place]; niece [name] in [place] [phone], [phone])   Doesn't drive        Allergies as of 02/16/2017      Reactions   Penicillins Anaphylaxis   Codeine Other (See Comments)   unknown   Sulfonamide Derivatives Other (See Comments)   unknown      Medication List       Accurate as of 02/16/17 11:59 PM. Always use your most recent med list.          acetaminophen 500 MG tablet Commonly known as:  TYLENOL Take 500 mg by mouth every 6 (six) hours as needed.   albuterol 108 (90 Base) MCG/ACT inhaler Commonly known as:  PROVENTIL HFA;VENTOLIN HFA Inhale 2 puffs into the lungs every 6 (six) hours as needed for wheezing or shortness of breath.   aspirin-acetaminophen-caffeine 250-250-65 MG tablet Commonly known as:  EXCEDRIN MIGRAINE Take 2 tablets by mouth every 6 (six) hours as needed for headache (headache). Reported on 10/09/2015   atorvastatin 10 MG tablet Commonly known as:  LIPITOR Take 1 tablet (10 mg  total) by mouth at bedtime.   buPROPion 300 MG 24 hr tablet Commonly known as:  WELLBUTRIN XL Take 300 mg by mouth daily.   carvedilol 12.5 MG tablet Commonly known as:  COREG Take 1 tablet twice a day   denosumab 60 MG/ML Soln injection Commonly known as:  PROLIA Inject 60 mg into the skin every 6 (six) months. Administer in upper arm, thigh, or abdomen   eucerin cream Apply 1 application topically as needed for dry skin.   ezetimibe 10 MG tablet Commonly known as:  ZETIA Take 10 mg by mouth daily.   gabapentin 300 MG capsule Commonly known as:  NEURONTIN Take 1 cap in AM, 1 cap at noon, 2 caps at bedtime   guaiFENesin-dextromethorphan 100-10 MG/5ML syrup Commonly known as:  ROBITUSSIN DM Take 5 mLs by mouth every 12 (twelve) hours as needed for cough.   HYDROcodone-acetaminophen 5-325 MG tablet Commonly known as:  NORCO/VICODIN Take 1-2 tablets  by mouth every 8 (eight) hours as needed.   levothyroxine 125 MCG tablet Commonly known as:  SYNTHROID, LEVOTHROID Take 125 mcg by mouth daily before breakfast.   linagliptin 5 MG Tabs tablet Commonly known as:  TRADJENTA Take 1 tablet (5 mg total) by mouth at bedtime.   meclizine 25 MG tablet Commonly known as:  ANTIVERT Take 25 mg by mouth at bedtime as needed for dizziness.   ondansetron 4 MG tablet Commonly known as:  ZOFRAN Take 4 mg by mouth every 8 (eight) hours as needed for nausea or vomiting.   OXYGEN Inhale 2 L into the lungs continuous. As directed.   polyethylene glycol packet Commonly known as:  MIRALAX / GLYCOLAX Take 17 g by mouth daily as needed for mild constipation. Reported on 10/09/2015   repaglinide 0.5 MG tablet Commonly known as:  PRANDIN Take 1 tablet (0.5 mg total) by mouth 3 (three) times daily before meals.   rivaroxaban 20 MG Tabs tablet Commonly known as:  XARELTO Take 1 tablet (20 mg total) by mouth daily with supper.   sertraline 25 MG tablet Commonly known as:  ZOLOFT Take 25 mg by mouth daily.   SYSTANE BALANCE 0.6 % Soln Generic drug:  Propylene Glycol Place 1 drop into both eyes 3 (three) times daily as needed (for eyes). Reported on 10/09/2015          Objective:   Physical Exam BP 126/78 (BP Location: Left Arm, Patient Position: Sitting, Cuff Size: Normal)   Pulse (!) 51   Temp 97.9 F (36.6 C) (Oral)   Resp 14   Ht 5\' 4"  (1.626 m)   Wt 217 lb 2 oz (98.5 kg)   SpO2 91%   BMI 37.27 kg/m  General:   Well developed, well nourished , On oxygen, NAD.  HEENT:  Normocephalic . Face symmetric, atraumatic Lungs:  CTA B Normal respiratory effort, no intercostal retractions, no accessory muscle use. Heart: RRR,  no murmur.  Trace pretibial edema bilaterally  Skin: Not pale. Not jaundice Neurologic:  alert & oriented X3.  Speech at baseline, gait assisted by a walker, and at baseline Psych--  Cooperative with normal  attention span and concentration.  Behavior appropriate. No anxious or depressed appearing.     Assessment & Plan:   Assessment DM, + Neuropathy DX 01/2016 Hypertension Hypothyroidism Hyperlipidemia Anxiety, depression Chronic renal insufficiency creatinine ~1.3, 1.5 Hematology: on xarelto --MGUS dx 08-2016  --Multiple DVTs --PE 2 COPD asthma,home  O2 Neuro: --h/o  stroke --Tremor  GI: --GERD, diverticulosis, HH,  hemorrhoids, MSK: --Osteoporosis, normal vitamin D . T score 07-2015: -3.6,  Prolia next 05-2017 --DJD --Spinal stenosis, MRI 2009  PLAN:  DM: Continue with Tradjenta-Prandin. Check A1c, microalbumin HTN: Continue carvedilol, check a BMP. Hypothyroidism: On Synthroid, check a TSH High cholesterol: On Lipitor and Zetia, last FLP more than 2 years ago, will check today understanding patient is not fasting.  COPD: Seems stable. Patient request a order to keep her inhalers in her room, sometimes she feels that she needs it and he takes long time for the nurses to provide it. I think that is okay. RTC 4 months

## 2017-02-16 NOTE — Patient Instructions (Signed)
GO TO THE LAB : Get the blood work     GO TO THE FRONT DESK Schedule your next appointment for a   checkup in 4 months  

## 2017-02-16 NOTE — Progress Notes (Signed)
Pre visit review using our clinic review tool, if applicable. No additional management support is needed unless otherwise documented below in the visit note. 

## 2017-02-17 NOTE — Assessment & Plan Note (Signed)
DM: Continue with Tradjenta-Prandin. Check A1c, microalbumin HTN: Continue carvedilol, check a BMP. Hypothyroidism: On Synthroid, check a TSH High cholesterol: On Lipitor and Zetia, last FLP more than 2 years ago, will check today understanding patient is not fasting.  COPD: Seems stable. Patient request a order to keep her inhalers in her room, sometimes she feels that she needs it and he takes long time for the nurses to provide it. I think that is okay. RTC 4 months

## 2017-04-12 ENCOUNTER — Telehealth: Payer: Self-pay | Admitting: Internal Medicine

## 2017-04-12 NOTE — Telephone Encounter (Signed)
Med Tech called to schedule appointment for patient to be seen for SOB and back pain. Call transferred to team health

## 2017-04-12 NOTE — Telephone Encounter (Signed)
Patient Name: Dawn Morrow  DOB: 03/21/1936    Initial Comment caller is a med tech with assisted living . Patient is having cold symptoms , shortness of breath and back pain . calling to get an appointment an was transferred by the office    Nurse Assessment  Nurse: Leilani Merl, RN, Nira Conn Date/Time Eilene Ghazi Time): 04/12/2017 2:07:50 PM  Confirm and document reason for call. If symptomatic, describe symptoms. ---caller is a med tech with assisted living . Patient is having cold symptoms on Saturday , shortness of breath and back pain that started this morning . calling to get an appointment an was transferred by the office  Does the patient have any new or worsening symptoms? ---Yes  Will a triage be completed? ---Yes  Related visit to physician within the last 2 weeks? ---No  Does the PT have any chronic conditions? (i.e. diabetes, asthma, etc.) ---Yes  List chronic conditions. ---See MR  Is this a behavioral health or substance abuse call? ---No     Guidelines    Guideline Title Affirmed Question Affirmed Notes  Cough - Acute Productive SEVERE coughing spells (e.g., whooping sound after coughing, vomiting after coughing)    Final Disposition User   See Physician within 24 Hours Stand[place], RN, Water quality scientist    Comments  No appts available at the office in the mext 24 hours and caller and patient want patient to be seen at the office,  Please call [facility] at [phone]   Referrals  REFERRED TO PCP OFFICE   Disagree/Comply: Comply

## 2017-04-12 NOTE — Telephone Encounter (Signed)
thx

## 2017-04-12 NOTE — Telephone Encounter (Signed)
Called Jasmine to follow up on patient.  Jasmine stated that she was on the one that called, but the nurse was standing next to her and could speak with me.  Naomi, the nurse, picked up and stated that patient complained of shortness of breath, a cough, and nasal drainage this morning, but since then seems to be doing better.  She is taking Mucinex.  She also complained of back pain (chronic for patient).  She has been ambulating with her walker; she has attended all activities without difficulty.  Nurse states she seems stable at the moment, does not complain of fever, chest pain, shortness of breath.  Back pain not severe.  But patient still would like an appt.  Scheduled patient for tomorrow at 11:30 am with Dr. Larose Kells.  Advised nurse if symptoms worsen or new symptoms develop to send patient to the ER.  Nurse stated understanding and agreed to comply.

## 2017-04-13 ENCOUNTER — Encounter: Payer: Self-pay | Admitting: Internal Medicine

## 2017-04-13 ENCOUNTER — Ambulatory Visit (INDEPENDENT_AMBULATORY_CARE_PROVIDER_SITE_OTHER): Payer: Medicare Other | Admitting: Internal Medicine

## 2017-04-13 ENCOUNTER — Ambulatory Visit (HOSPITAL_BASED_OUTPATIENT_CLINIC_OR_DEPARTMENT_OTHER)
Admission: RE | Admit: 2017-04-13 | Discharge: 2017-04-13 | Disposition: A | Discharge status: Home / Self Care | Payer: Medicare Other | Source: Ambulatory Visit | Attending: Internal Medicine | Admitting: Internal Medicine

## 2017-04-13 VITALS — BP 126/78 | HR 68 | Temp 98.2°F | Resp 14 | Ht 64.0 in | Wt 216.5 lb

## 2017-04-13 DIAGNOSIS — I700 Atherosclerosis of aorta: Secondary | ICD-10-CM | POA: Insufficient documentation

## 2017-04-13 DIAGNOSIS — R079 Chest pain, unspecified: Secondary | ICD-10-CM

## 2017-04-13 DIAGNOSIS — R0609 Other forms of dyspnea: Secondary | ICD-10-CM

## 2017-04-13 DIAGNOSIS — R0600 Dyspnea, unspecified: Secondary | ICD-10-CM

## 2017-04-13 LAB — CBC WITH DIFFERENTIAL/PLATELET
Basophils Absolute: 0.1 10*3/uL (ref 0.0–0.1)
Basophils Relative: 0.8 % (ref 0.0–3.0)
Eosinophils Absolute: 0.4 10*3/uL (ref 0.0–0.7)
Eosinophils Relative: 3.9 % (ref 0.0–5.0)
HCT: 42.5 % (ref 36.0–46.0)
Hemoglobin: 13.5 g/dL (ref 12.0–15.0)
Lymphocytes Relative: 19.8 % (ref 12.0–46.0)
Lymphs Abs: 1.9 10*3/uL (ref 0.7–4.0)
MCHC: 31.7 g/dL (ref 30.0–36.0)
MCV: 94.3 fl (ref 78.0–100.0)
Monocytes Absolute: 0.6 10*3/uL (ref 0.1–1.0)
Monocytes Relative: 6.6 % (ref 3.0–12.0)
Neutro Abs: 6.5 10*3/uL (ref 1.4–7.7)
Neutrophils Relative %: 68.9 % (ref 43.0–77.0)
Platelets: 218 10*3/uL (ref 150.0–400.0)
RBC: 4.5 Mil/uL (ref 3.87–5.11)
RDW: 16.6 % — ABNORMAL HIGH (ref 11.5–15.5)
WBC: 9.5 10*3/uL (ref 4.0–10.5)

## 2017-04-13 LAB — TROPONIN I: TNIDX: 0.01 ug/l (ref 0.00–0.06)

## 2017-04-13 MED ORDER — AZITHROMYCIN 250 MG PO TABS: ORAL_TABLET | ORAL | 0 refills | Status: DC

## 2017-04-13 NOTE — Patient Instructions (Signed)
GO TO THE LAB : Get the blood work     GO TO THE FRONT DESK Schedule your next appointment for a  Check up in 4 weeks   STOP BY THE FIRST FLOOR:  get the XR    For cough:  Start taking Zithromax  Mucinex DM twice a day every day until better  Use albuterol 2 puffs 3 times a day for one week then 2 puffs every 6 hours as needed  Call or go to the ER if severe chest pain or increased difficulty breathing.

## 2017-04-13 NOTE — Progress Notes (Signed)
Pre visit review using our clinic review tool, if applicable. No additional management support is needed unless otherwise documented below in the visit note. 

## 2017-04-13 NOTE — Progress Notes (Signed)
Subjective:    Patient ID: Dawn Morrow, female    DOB: 1936-03-07, 81 y.o.   MRN: [id number]  DOS:  04/13/2017 Type of visit - description : acute her w/ Jasmine Interval history: Initial visit was set up for cough and nasal congestion. The patient however reports that for the last few days when she lays down she gets lightheaded and "choke". Jasmine reports that they are concerned because they notice DOE after she walks around the community. The patient also reports chest pain, for few days, burning-like, on and off. Not associated with food. I asked other questions to clarify her symptoms but couldn't get anymore information or details. (For how long she is lightheaded?   Is the chest pain exertional, radiates? How long does state chest pain last?)   Review of Systems Reports no fever chills No edema She did reports cough and occasional green sputum. Denies wheezing.   Past Medical History:  Diagnosis Date  . Anxiety and depression   . Asthma   . Cataract   . COPD (chronic obstructive pulmonary disease) ([place])   . Diabetes mellitus   . Diverticulosis   . DVT (deep vein thrombosis) in pregnancy (HCC)    hx x multiple on coumadin  . GERD (gastroesophageal reflux disease)   . Hemorrhoids   . Hiatal hernia   . Hyperlipidemia   . Hypertension   . Hypothyroidism   . Osteoarthritis    h/o spinal stenosis by MRI 2009  . Osteopenia   . Pulmonary embolism (HCC)    x 2  . Stroke [facility])    MINI  . Tubular adenoma of colon 07/2005    Past Surgical History:  Procedure Laterality Date  . APPENDECTOMY    . CATARACT EXTRACTION    . CHOLECYSTECTOMY    . LUMBAR FUSION    . POLYPECTOMY    . TONSILLECTOMY    . TUBAL LIGATION      Social History   Social History  . Marital status: Divorced    Spouse name: N/A  . Number of children: 3  . Years of education: N/A   Occupational History  . retired Unemployed   Social History Main Topics  . Smoking status: Former Smoker   Types: Cigarettes  . Smokeless tobacco: Never Used  . Alcohol use No     Comment: socially  . Drug use: No  . Sexual activity: Not on file   Other Topics Concern  . Not on file   Social History Narrative   Single,   lost her sister in 2012. 3 children , all live in [place] to [place] late 10-2013   Nephew Joe Cells, lives in [place]; niece [name] in [place] [phone], [phone])   Doesn't drive        Allergies as of 04/13/2017      Reactions   Penicillins Anaphylaxis   Codeine Other (See Comments)   unknown   Sulfonamide Derivatives Other (See Comments)   unknown      Medication List       Accurate as of 04/13/17 11:59 PM. Always use your most recent med list.          acetaminophen 500 MG tablet Commonly known as:  TYLENOL Take 500 mg by mouth every 6 (six) hours as needed.   albuterol 108 (90 Base) MCG/ACT inhaler Commonly known as:  PROVENTIL HFA;VENTOLIN HFA Inhale 2 puffs into the lungs every 6 (six) hours as needed for wheezing or  shortness of breath.   aspirin-acetaminophen-caffeine 250-250-65 MG tablet Commonly known as:  EXCEDRIN MIGRAINE Take 2 tablets by mouth every 6 (six) hours as needed for headache (headache). Reported on 10/09/2015   atorvastatin 10 MG tablet Commonly known as:  LIPITOR Take 1 tablet (10 mg total) by mouth at bedtime.   azithromycin 250 MG tablet Commonly known as:  ZITHROMAX Z-PAK 2 tabs a day the first day, then 1 tab a day x 4 days   buPROPion 300 MG 24 hr tablet Commonly known as:  WELLBUTRIN XL Take 450 mg by mouth daily.   carvedilol 12.5 MG tablet Commonly known as:  COREG Take 1 tablet twice a day   denosumab 60 MG/ML Soln injection Commonly known as:  PROLIA Inject 60 mg into the skin every 6 (six) months. Administer in upper arm, thigh, or abdomen   dextromethorphan-guaiFENesin 30-600 MG 12hr tablet Commonly known as:  MUCINEX DM Take 1 tablet by mouth 2 (two) times daily as needed for cough.     eucerin cream Apply 1 application topically as needed for dry skin.   ezetimibe 10 MG tablet Commonly known as:  ZETIA Take 10 mg by mouth daily.   gabapentin 300 MG capsule Commonly known as:  NEURONTIN Take 1 cap in AM, 1 cap at noon, 2 caps at bedtime   HYDROcodone-acetaminophen 5-325 MG tablet Commonly known as:  NORCO/VICODIN Take 1-2 tablets by mouth every 8 (eight) hours as needed.   levothyroxine 125 MCG tablet Commonly known as:  SYNTHROID, LEVOTHROID Take 125 mcg by mouth daily before breakfast.   linagliptin 5 MG Tabs tablet Commonly known as:  TRADJENTA Take 1 tablet (5 mg total) by mouth at bedtime.   meclizine 25 MG tablet Commonly known as:  ANTIVERT Take 25 mg by mouth at bedtime as needed for dizziness.   ondansetron 4 MG tablet Commonly known as:  ZOFRAN Take 4 mg by mouth every 8 (eight) hours as needed for nausea or vomiting.   OXYGEN Inhale 2 L into the lungs continuous. As directed.   polyethylene glycol packet Commonly known as:  MIRALAX / GLYCOLAX Take 17 g by mouth daily as needed for mild constipation. Reported on 10/09/2015   repaglinide 0.5 MG tablet Commonly known as:  PRANDIN Take 1 tablet (0.5 mg total) by mouth 3 (three) times daily before meals.   rivaroxaban 20 MG Tabs tablet Commonly known as:  XARELTO Take 1 tablet (20 mg total) by mouth daily with supper.   sertraline 25 MG tablet Commonly known as:  ZOLOFT Take 25 mg by mouth daily.   SYSTANE BALANCE 0.6 % Soln Generic drug:  Propylene Glycol Place 1 drop into both eyes 3 (three) times daily as needed (for eyes). Reported on 10/09/2015          Objective:   Physical Exam BP 126/78 (BP Location: Right Arm, Patient Position: Sitting, Cuff Size: Normal)   Pulse 68   Temp 98.2 F (36.8 C) (Oral)   Resp 14   Ht 5\' 4"  (1.626 m)   Wt 216 lb 8 oz (98.2 kg)   SpO2 93%   BMI 37.16 kg/m  General:   Well developed, well nourished . NAD. On oxygen supplementation. HEENT:   Normocephalic . Face symmetric, atraumatic. Nose is slightly congested Neck: No JVD at 45 Lungs:  Decreased breath sounds but clear Normal respiratory effort, no intercostal retractions, no accessory muscle use. Heart: RRR,  no murmur.  no pretibial edema bilaterally  Abdomen:  Not  distended, soft, non-tender. No rebound or rigidity.  Skin: Not pale. Not jaundice Neurologic:  alert & oriented to self, time, place, recognized me. She forgot the name off her car gave her Speech normal, gait assisted by a walker Psych--  Cognition and judgment appear intact.  Cooperative with normal attention span and concentration.  Behavior appropriate. No anxious or depressed appearing.      Assessment & Plan:  Assessment DM, + Neuropathy DX 01/2016 Hypertension Hypothyroidism Hyperlipidemia Anxiety, depression Chronic renal insufficiency creatinine ~1.3, 1.5 Hematology: on xarelto --MGUS dx 08-2016  --Multiple DVTs --PE 2 COPD asthma,home  O2 Neuro: --h/o  stroke --Tremor  GI: --GERD, diverticulosis, HH, hemorrhoids, MSK: --Osteoporosis, normal vitamin D . T score 07-2015: -3.6,  Prolia next 05-2017 --DJD --Spinal stenosis, MRI 2009  PLAN:  Presents with several complaints: The nursing staff noted DOE x 1 week; patient reports chest pain, cough, sputum production, dizziness. History taking is very challenging. Exam is actually benign with no evidence of volume overload. EKG today NSR, d/w cards:  poor R wave progression, different from previous EKG, could be positional. The EKG was taken indeed when the patient was not flat but rather at 45 angle. Appreciate his input Because chest pain and DOE will get a troponin, otherwise will treat as a respiratory infection. If not better after treatment will refer to cardiology. Plan: CBC, chest x-ray, troponin. Zithromax, Mucinex, more liberal use of inhaler and Flonase. See instructions

## 2017-04-14 NOTE — Assessment & Plan Note (Signed)
Presents with several complaints: The nursing staff noted DOE x 1 week; patient reports chest pain, cough, sputum production, dizziness. History taking is very challenging. Exam is actually benign with no evidence of volume overload. EKG today NSR, d/w cards:  poor R wave progression, different from previous EKG, could be positional. The EKG was taken indeed when the patient was not flat but rather at 45 angle. Appreciate his input Because chest pain and DOE will get a troponin, otherwise will treat as a respiratory infection. If not better after treatment will refer to cardiology. Plan: CBC, chest x-ray, troponin. Zithromax, Mucinex, more liberal use of inhaler and Flonase. See instructions

## 2017-04-19 ENCOUNTER — Telehealth: Payer: Self-pay | Admitting: Internal Medicine

## 2017-04-19 NOTE — Telephone Encounter (Signed)
Tricia- can you order Prolia for Pt? 

## 2017-04-19 NOTE — Telephone Encounter (Signed)
Prolia benefits verified  No PA required Deductible met 20% co-insurance should be covered by secondary   Patient may owe approximately $0 OOP  Due after 05/30/17

## 2017-04-21 ENCOUNTER — Telehealth: Payer: Self-pay | Admitting: *Deleted

## 2017-04-21 NOTE — Telephone Encounter (Signed)
Prolia has been received. 

## 2017-04-21 NOTE — Telephone Encounter (Signed)
Received Physician Orders for Medication Report Review from [facility], forwarded to provider/SLS 08/15

## 2017-04-21 NOTE — Telephone Encounter (Signed)
Pt has 4 month follow-up scheduled 06/17/2017.

## 2017-04-22 NOTE — Telephone Encounter (Signed)
Form signed and faxed to [facility] at [phone]. Form sent for scanning.

## 2017-05-05 ENCOUNTER — Telehealth: Payer: Self-pay | Admitting: *Deleted

## 2017-05-05 NOTE — Telephone Encounter (Signed)
Received Physician Orders from [facility];, forwarded to covering provider [PCP out of office all week]/SLS 08/29

## 2017-05-11 ENCOUNTER — Ambulatory Visit: Payer: Medicare Other | Admitting: Internal Medicine

## 2017-05-14 ENCOUNTER — Ambulatory Visit: Payer: Medicare Other | Admitting: Internal Medicine

## 2017-05-26 ENCOUNTER — Ambulatory Visit: Payer: Medicare Other | Admitting: Internal Medicine

## 2017-05-31 ENCOUNTER — Telehealth: Payer: Self-pay | Admitting: *Deleted

## 2017-05-31 NOTE — Telephone Encounter (Signed)
Received Physician Orders from [facility]; forwarded to provider/SLS 09/24 Received Statement of Certifying Physician for [facility]; forwarded to provider/SLS 09/24

## 2017-06-01 NOTE — Telephone Encounter (Signed)
Okay per PCP for Pt to have flu shot, and pneumovax-23 booster at [facility]. Orders faxed to [facility] at [phone]. Form sent for scanning.

## 2017-06-02 NOTE — Telephone Encounter (Signed)
Diabetic shoe form completed and faxed to [place] ([place]) at [phone]. Form sent for scanning.

## 2017-06-14 LAB — BASIC METABOLIC PANEL
BUN: 21 (ref 4–21)
Creatinine: 1.3 — AB (ref 0.5–1.1)
Glucose: 148
Potassium: 4.9 (ref 3.4–5.3)
Sodium: 145 (ref 137–147)

## 2017-06-14 LAB — CBC AND DIFFERENTIAL: Hemoglobin: 12.8 (ref 12.0–16.0)

## 2017-06-17 ENCOUNTER — Encounter: Payer: Self-pay | Admitting: Internal Medicine

## 2017-06-17 ENCOUNTER — Telehealth: Payer: Self-pay | Admitting: *Deleted

## 2017-06-17 ENCOUNTER — Ambulatory Visit (INDEPENDENT_AMBULATORY_CARE_PROVIDER_SITE_OTHER): Payer: Medicare Other | Admitting: Internal Medicine

## 2017-06-17 VITALS — BP 126/68 | HR 70 | Temp 97.5°F | Resp 14 | Ht 64.0 in | Wt 216.4 lb

## 2017-06-17 DIAGNOSIS — I10 Essential (primary) hypertension: Secondary | ICD-10-CM

## 2017-06-17 DIAGNOSIS — E1159 Type 2 diabetes mellitus with other circulatory complications: Secondary | ICD-10-CM

## 2017-06-17 DIAGNOSIS — K1121 Acute sialoadenitis: Secondary | ICD-10-CM

## 2017-06-17 DIAGNOSIS — M810 Age-related osteoporosis without current pathological fracture: Secondary | ICD-10-CM | POA: Diagnosis not present

## 2017-06-17 LAB — CBC WITH DIFFERENTIAL/PLATELET
Basophils Absolute: 0.1 10*3/uL (ref 0.0–0.1)
Basophils Relative: 0.6 % (ref 0.0–3.0)
Eosinophils Absolute: 0.3 10*3/uL (ref 0.0–0.7)
Eosinophils Relative: 2.8 % (ref 0.0–5.0)
HCT: 39.1 % (ref 36.0–46.0)
Hemoglobin: 12.5 g/dL (ref 12.0–15.0)
Lymphocytes Relative: 12.7 % (ref 12.0–46.0)
Lymphs Abs: 1.3 10*3/uL (ref 0.7–4.0)
MCHC: 31.8 g/dL (ref 30.0–36.0)
MCV: 96.1 fl (ref 78.0–100.0)
Monocytes Absolute: 0.6 10*3/uL (ref 0.1–1.0)
Monocytes Relative: 5.6 % (ref 3.0–12.0)
Neutro Abs: 7.8 10*3/uL — ABNORMAL HIGH (ref 1.4–7.7)
Neutrophils Relative %: 78.3 % — ABNORMAL HIGH (ref 43.0–77.0)
Platelets: 210 10*3/uL (ref 150.0–400.0)
RBC: 4.07 Mil/uL (ref 3.87–5.11)
RDW: 15.4 % (ref 11.5–15.5)
WBC: 10 10*3/uL (ref 4.0–10.5)

## 2017-06-17 LAB — HEMOGLOBIN A1C: Hgb A1c MFr Bld: 6.3 % (ref 4.6–6.5)

## 2017-06-17 LAB — COMPREHENSIVE METABOLIC PANEL
ALT: 10 U/L (ref 0–35)
AST: 12 U/L (ref 0–37)
Albumin: 3.7 g/dL (ref 3.5–5.2)
Alkaline Phosphatase: 90 U/L (ref 39–117)
BUN: 23 mg/dL (ref 6–23)
CO2: 33 mEq/L — ABNORMAL HIGH (ref 19–32)
Calcium: 8.6 mg/dL (ref 8.4–10.5)
Chloride: 104 mEq/L (ref 96–112)
Creatinine, Ser: 1.45 mg/dL — ABNORMAL HIGH (ref 0.40–1.20)
GFR: 37 mL/min — ABNORMAL LOW (ref >60.00)
Glucose, Bld: 144 mg/dL — ABNORMAL HIGH (ref 70–99)
Potassium: 4.7 mEq/L (ref 3.5–5.1)
Sodium: 143 mEq/L (ref 135–145)
Total Bilirubin: 0.4 mg/dL (ref 0.2–1.2)
Total Protein: 7.1 g/dL (ref 6.0–8.3)

## 2017-06-17 MED ORDER — CLINDAMYCIN HCL 300 MG PO CAPS: 300.0000 mg | ORAL_CAPSULE | Freq: Three times a day (TID) | ORAL | 0 refills | Status: DC

## 2017-06-17 MED ORDER — CIPROFLOXACIN HCL 500 MG PO TABS: 500.0000 mg | ORAL_TABLET | Freq: Two times a day (BID) | ORAL | 0 refills | Status: DC

## 2017-06-17 MED ORDER — DENOSUMAB 60 MG/ML ~~LOC~~ SOLN
60.0000 mg | Freq: Once | SUBCUTANEOUS | Status: AC
  Administered 2017-06-17: 60 mg via SUBCUTANEOUS

## 2017-06-17 NOTE — Patient Instructions (Signed)
GO TO THE LAB : Get the blood work     GO TO THE FRONT DESK Schedule your next appointment for a  checkup from for the face swelling in 3 weeks  Schedule a routine visit 4 months from today   You have a infection called  PAROTITIS  Take clindamycin and ciprofloxacin for one week  Apply a warm compress to your parotid gland on the left twice a day  Call if not gradually improving  Call anytime if they face gets more swollen, red, warm or if you have any ear discharge

## 2017-06-17 NOTE — Progress Notes (Signed)
Pre visit review using our clinic review tool, if applicable. No additional management support is needed unless otherwise documented below in the visit note. 

## 2017-06-17 NOTE — Progress Notes (Signed)
Subjective:    Patient ID: Dawn Morrow, female    DOB: 14-Oct-1935, 81 y.o.   MRN: [id number]  DOS:  06/17/2017 Type of visit - description : rov Interval history: In general feeling well. DM: Good compliance of medication, ambulatory CBGs in the 130s. Anxiety depression, good med compliance, she feels well emotionally. A week ago noted left ear pain and some swelling at the parotid area, symptoms are on and off, denies any dental pain per se or ear d/c Increase right knee pain from baseline for one day, no fall.  Review of Systems  Denies fever chills No chest pain, shortness of breath at baseline, no lower extremity edema Rarely has cough No nausea, vomiting, diarrhea or blood in the stools  Past Medical History:  Diagnosis Date  . Anxiety and depression   . Asthma   . Cataract   . COPD (chronic obstructive pulmonary disease) ([place])   . Diabetes mellitus   . Diverticulosis   . DVT (deep vein thrombosis) in pregnancy (HCC)    hx x multiple on coumadin  . GERD (gastroesophageal reflux disease)   . Hemorrhoids   . Hiatal hernia   . Hyperlipidemia   . Hypertension   . Hypothyroidism   . Osteoarthritis    h/o spinal stenosis by MRI 2009  . Osteopenia   . Pulmonary embolism (HCC)    x 2  . Stroke [facility])    MINI  . Tubular adenoma of colon 07/2005    Past Surgical History:  Procedure Laterality Date  . APPENDECTOMY    . CATARACT EXTRACTION    . CHOLECYSTECTOMY    . LUMBAR FUSION    . POLYPECTOMY    . TONSILLECTOMY    . TUBAL LIGATION      Social History   Social History  . Marital status: Divorced    Spouse name: N/A  . Number of children: 3  . Years of education: N/A   Occupational History  . retired Unemployed   Social History Main Topics  . Smoking status: Former Smoker    Types: Cigarettes  . Smokeless tobacco: Never Used  . Alcohol use No     Comment: socially  . Drug use: No  . Sexual activity: Not on file   Other Topics Concern  . Not on  file   Social History Narrative   Single,   lost her sister in 2012. 3 children , all live in [place] to [place] late 10-2013   Nephew Joe Cells, lives in [place]; niece [name] in [place] [phone], [phone])   Doesn't drive        Allergies as of 06/17/2017      Reactions   Penicillins Anaphylaxis   Codeine Other (See Comments)   unknown   Sulfonamide Derivatives Other (See Comments)   unknown      Medication List       Accurate as of 06/17/17 11:59 PM. Always use your most recent med list.          acetaminophen 500 MG tablet Commonly known as:  TYLENOL Take 500 mg by mouth every 6 (six) hours as needed.   albuterol 108 (90 Base) MCG/ACT inhaler Commonly known as:  PROVENTIL HFA;VENTOLIN HFA Inhale 2 puffs into the lungs every 6 (six) hours as needed for wheezing or shortness of breath.   aspirin-acetaminophen-caffeine 250-250-65 MG tablet Commonly known as:  EXCEDRIN MIGRAINE Take 2 tablets by mouth every 6 (six) hours as needed for  headache (headache). Reported on 10/09/2015   atorvastatin 10 MG tablet Commonly known as:  LIPITOR Take 1 tablet (10 mg total) by mouth at bedtime.   buPROPion 300 MG 24 hr tablet Commonly known as:  WELLBUTRIN XL Take 450 mg by mouth daily.   carvedilol 12.5 MG tablet Commonly known as:  COREG Take 1 tablet twice a day   ciprofloxacin 500 MG tablet Commonly known as:  CIPRO Take 1 tablet (500 mg total) by mouth 2 (two) times daily.   clindamycin 300 MG capsule Commonly known as:  CLEOCIN Take 1 capsule (300 mg total) by mouth 3 (three) times daily.   denosumab 60 MG/ML Soln injection Commonly known as:  PROLIA Inject 60 mg into the skin every 6 (six) months. Administer in upper arm, thigh, or abdomen   dextromethorphan-guaiFENesin 30-600 MG 12hr tablet Commonly known as:  MUCINEX DM Take 1 tablet by mouth 2 (two) times daily as needed for cough.   eucerin cream Apply 1 application topically as needed for  dry skin.   ezetimibe 10 MG tablet Commonly known as:  ZETIA Take 10 mg by mouth daily.   gabapentin 300 MG capsule Commonly known as:  NEURONTIN Take 1 cap in AM, 1 cap at noon, 2 caps at bedtime   HYDROcodone-acetaminophen 5-325 MG tablet Commonly known as:  NORCO/VICODIN Take 1-2 tablets by mouth every 8 (eight) hours as needed.   levothyroxine 125 MCG tablet Commonly known as:  SYNTHROID, LEVOTHROID Take 125 mcg by mouth daily before breakfast.   linagliptin 5 MG Tabs tablet Commonly known as:  TRADJENTA Take 1 tablet (5 mg total) by mouth at bedtime.   meclizine 25 MG tablet Commonly known as:  ANTIVERT Take 25 mg by mouth at bedtime as needed for dizziness.   ondansetron 4 MG tablet Commonly known as:  ZOFRAN Take 4 mg by mouth every 8 (eight) hours as needed for nausea or vomiting.   OXYGEN Inhale 2 L into the lungs continuous. As directed.   polyethylene glycol packet Commonly known as:  MIRALAX / GLYCOLAX Take 17 g by mouth daily as needed for mild constipation. Reported on 10/09/2015   repaglinide 0.5 MG tablet Commonly known as:  PRANDIN Take 1 tablet (0.5 mg total) by mouth 3 (three) times daily before meals.   rivaroxaban 20 MG Tabs tablet Commonly known as:  XARELTO Take 1 tablet (20 mg total) by mouth daily with supper.   sertraline 25 MG tablet Commonly known as:  ZOLOFT Take 25 mg by mouth daily.   SYSTANE BALANCE 0.6 % Soln Generic drug:  Propylene Glycol Place 1 drop into both eyes 3 (three) times daily as needed (for eyes). Reported on 10/09/2015          Objective:   Physical Exam BP 126/68 (BP Location: Right Arm, Patient Position: Sitting, Cuff Size: Normal)   Pulse 70   Temp (!) 97.5 F (36.4 C) (Oral)   Resp 14   Ht 5\' 4"  (1.626 m)   Wt 216 lb 6 oz (98.1 kg)   SpO2 96%   BMI 37.14 kg/m  General:   Well developed, well nourished . NAD.  HEENT:  Normocephalic . Face symmetric, atraumatic; se pictures Right parotid  normal Left parotid: Moderately tender and enlarged, not really warm. Ear canal difficult to see due to wax but is not red or swollen. No trismus. Mouth: Has few teeth, no obvious gum swelling or abscess Lungs:  CTA B Normal respiratory effort, no intercostal retractions,  no accessory muscle use. Heart: RRR,  no murmur.  No pretibial edema bilaterally  Skin: Not pale. Not jaundice Neurologic:  alert & oriented X3.  Speech normal, gait assisted by a rolling walker, + tremor, seems at baseline. Psych--  Behavior appropriate. No anxious or depressed appearing.  DIABETIC FEET EXAM: No lower extremity edema Normal pedal pulses bilaterally Skin normal  Pinprick examination of the feet : Patches areas of  decrease sensitivity          Assessment & Plan:   Assessment DM, + Neuropathy DX 01/2016 Hypertension Hypothyroidism Hyperlipidemia Anxiety, depression Chronic renal insufficiency creatinine ~1.3, 1.5 Hematology: on xarelto --MGUS dx 08-2016  --Multiple DVTs --PE 2 COPD asthma,home  O2 Neuro: --h/o  stroke --Tremor  GI: --GERD, diverticulosis, HH, hemorrhoids, MSK: --Osteoporosis, normal vitamin D . T score 07-2015: -3.6,  Prolia done 06-2017 --DJD --Spinal stenosis, MRI 2009  PLAN:  DM: Continue Tradjenta and Prandin. Check a A1c  . Neuropathy: Foot exam show mild neuropathy, feet care discussed HTN: Seems well-controlled on carvedilol, check a CMP Osteoporosis: Prolia today, order a bone density test Parotitis: See physical exam, clinda + Cipro for one week, warm compresses, RTC 3 weeks for a checkup. Call if worse. Printed instructions provided. DJD: Sxs relatively well controlled on Tylenol 2 gr twice a day, checking LFTs. Flu shot will be at home RTC 3 weeks and 4 months for a routine checkup

## 2017-06-17 NOTE — Telephone Encounter (Signed)
Received Physician Orders/Consultation Report on duplicate therapy recommendation from [facility] at [facility]; forwarded to provider/SLS 10/011

## 2017-06-18 NOTE — Telephone Encounter (Signed)
Ventolin okay, d/c Proventil. Form signed and faxed to [facility] at [phone]. Form sent for scanning.

## 2017-06-18 NOTE — Assessment & Plan Note (Signed)
DM: Continue Tradjenta and Prandin. Check a A1c  . Neuropathy: Foot exam show mild neuropathy, feet care discussed HTN: Seems well-controlled on carvedilol, check a CMP Osteoporosis: Prolia today, order a bone density test Parotitis: See physical exam, clinda + Cipro for one week, warm compresses, RTC 3 weeks for a checkup. Call if worse. Printed instructions provided. DJD: Sxs relatively well controlled on Tylenol 2 gr twice a day, checking LFTs. Flu shot will be at home RTC 3 weeks and 4 months for a routine checkup

## 2017-07-02 ENCOUNTER — Ambulatory Visit (INDEPENDENT_AMBULATORY_CARE_PROVIDER_SITE_OTHER): Payer: Medicare Other | Admitting: Internal Medicine

## 2017-07-02 ENCOUNTER — Telehealth: Payer: Self-pay

## 2017-07-02 ENCOUNTER — Encounter: Payer: Self-pay | Admitting: Internal Medicine

## 2017-07-02 VITALS — BP 126/78 | HR 67 | Temp 98.4°F | Resp 14 | Ht 64.0 in | Wt 222.5 lb

## 2017-07-02 DIAGNOSIS — K1120 Sialoadenitis, unspecified: Secondary | ICD-10-CM | POA: Diagnosis not present

## 2017-07-02 NOTE — Telephone Encounter (Signed)
Received call from [facility] today, they were concerned w/ how FL2 forms were completed today during OV such as Pt's respirations were normal instead of O2, her diet was listed as normal instead of carbohydrate controlled diet, there were no orders for blood sugar checks, etc. Another FL2 form completed and faxed to [facility] at [phone]. Received fax confirmation and forms sent for scanning.

## 2017-07-02 NOTE — Patient Instructions (Signed)
  GO TO THE FRONT DESK Schedule your next appointment for a  routine checkup in 3 months   We are referring you to the ENT doctor regards the left parotid enlargement

## 2017-07-02 NOTE — Progress Notes (Signed)
Pre visit review using our clinic review tool, if applicable. No additional management support is needed unless otherwise documented below in the visit note. 

## 2017-07-02 NOTE — Progress Notes (Signed)
Subjective:    Patient ID: Dawn Morrow, female    DOB: [birth year], 81 y.o.   MRN: [id number]  DOS:  07/02/2017 Type of visit - description : f/u Interval history: See last visit, was diagnosed with parotitis, status post Cipro and clindamycin. Overall feels well but enlarged  gland is still there.     Review of Systems Denies fever chills No pain at the parotid area.  Past Medical History:  Diagnosis Date  . Anxiety and depression   . Asthma   . Cataract   . COPD (chronic obstructive pulmonary disease) ([place])   . Diabetes mellitus   . Diverticulosis   . DVT (deep vein thrombosis) in pregnancy (HCC)    hx x multiple on coumadin  . GERD (gastroesophageal reflux disease)   . Hemorrhoids   . Hiatal hernia   . Hyperlipidemia   . Hypertension   . Hypothyroidism   . Osteoarthritis    h/o spinal stenosis by MRI 2009  . Osteopenia   . Pulmonary embolism (HCC)    x 2  . Stroke [facility])    MINI  . Tubular adenoma of colon 07/2005    Past Surgical History:  Procedure Laterality Date  . APPENDECTOMY    . CATARACT EXTRACTION    . CHOLECYSTECTOMY    . LUMBAR FUSION    . POLYPECTOMY    . TONSILLECTOMY    . TUBAL LIGATION      Social History   Social History  . Marital status: Divorced    Spouse name: N/A  . Number of children: 3  . Years of education: N/A   Occupational History  . retired Unemployed   Social History Main Topics  . Smoking status: Former Smoker    Types: Cigarettes  . Smokeless tobacco: Never Used  . Alcohol use No     Comment: socially  . Drug use: No  . Sexual activity: Not on file   Other Topics Concern  . Not on file   Social History Narrative   Single,   lost her sister in 2012. 3 children , all live in [place] to [place] late 10-2013   Nephew Joe Cells, lives in [place]; niece [name] in [place] [phone], [phone])   Doesn't drive        Allergies as of 07/02/2017      Reactions   Penicillins Anaphylaxis   Codeine  Other (See Comments)   unknown   Sulfonamide Derivatives Other (See Comments)   unknown      Medication List       Accurate as of 07/02/17 11:59 PM. Always use your most recent med list.          acetaminophen 500 MG tablet Commonly known as:  TYLENOL Take 500 mg by mouth every 6 (six) hours as needed.   albuterol 108 (90 Base) MCG/ACT inhaler Commonly known as:  PROVENTIL HFA;VENTOLIN HFA Inhale 2 puffs into the lungs every 6 (six) hours as needed for wheezing or shortness of breath.   aspirin-acetaminophen-caffeine 250-250-65 MG tablet Commonly known as:  EXCEDRIN MIGRAINE Take 2 tablets by mouth every 6 (six) hours as needed for headache (headache). Reported on 10/09/2015   atorvastatin 10 MG tablet Commonly known as:  LIPITOR Take 1 tablet (10 mg total) by mouth at bedtime.   buPROPion 300 MG 24 hr tablet Commonly known as:  WELLBUTRIN XL Take 450 mg by mouth daily.   carvedilol 12.5 MG tablet Commonly known as:  COREG Take 1 tablet twice a day   denosumab 60 MG/ML Soln injection Commonly known as:  PROLIA Inject 60 mg into the skin every 6 (six) months. Administer in upper arm, thigh, or abdomen   dextromethorphan-guaiFENesin 30-600 MG 12hr tablet Commonly known as:  MUCINEX DM Take 1 tablet by mouth 2 (two) times daily as needed for cough.   eucerin cream Apply 1 application topically as needed for dry skin.   ezetimibe 10 MG tablet Commonly known as:  ZETIA Take 10 mg by mouth daily.   gabapentin 300 MG capsule Commonly known as:  NEURONTIN Take 1 cap in AM, 1 cap at noon, 2 caps at bedtime   HYDROcodone-acetaminophen 5-325 MG tablet Commonly known as:  NORCO/VICODIN Take 1-2 tablets by mouth every 8 (eight) hours as needed.   levothyroxine 125 MCG tablet Commonly known as:  SYNTHROID, LEVOTHROID Take 125 mcg by mouth daily before breakfast.   linagliptin 5 MG Tabs tablet Commonly known as:  TRADJENTA Take 1 tablet (5 mg total) by mouth at  bedtime.   meclizine 25 MG tablet Commonly known as:  ANTIVERT Take 25 mg by mouth at bedtime as needed for dizziness.   ondansetron 4 MG tablet Commonly known as:  ZOFRAN Take 4 mg by mouth every 8 (eight) hours as needed for nausea or vomiting.   OXYGEN Inhale 2 L into the lungs continuous. As directed.   polyethylene glycol packet Commonly known as:  MIRALAX / GLYCOLAX Take 17 g by mouth daily as needed for mild constipation. Reported on 10/09/2015   repaglinide 0.5 MG tablet Commonly known as:  PRANDIN Take 1 tablet (0.5 mg total) by mouth 3 (three) times daily before meals.   rivaroxaban 20 MG Tabs tablet Commonly known as:  XARELTO Take 1 tablet (20 mg total) by mouth daily with supper.   sertraline 25 MG tablet Commonly known as:  ZOLOFT Take 25 mg by mouth daily.   SYSTANE BALANCE 0.6 % Soln Generic drug:  Propylene Glycol Place 1 drop into both eyes 3 (three) times daily as needed (for eyes). Reported on 10/09/2015          Objective:   Physical Exam  HENT:  Head:     BP 126/78 (BP Location: Right Arm, Patient Position: Sitting, Cuff Size: Normal)   Pulse 67   Temp 98.4 F (36.9 C) (Oral)   Resp 14   Ht 5\' 4"  (1.626 m)   Wt 222 lb 8 oz (100.9 kg)   SpO2 92%   BMI 38.19 kg/m  General:   Well developed, well nourished . NAD.  HEENT:  Normocephalic . Face symmetric, atraumatic  See graphic Skin: Not pale. Not jaundice Neurologic:  alert & oriented X3.  Speech normal, gait assisted Psych--  Cognition and judgment appear intact.  Cooperative with normal attention span and concentration.  Behavior appropriate. No anxious or depressed appearing.      Assessment & Plan:   Assessment DM, + Neuropathy DX 01/2016 Hypertension Hypothyroidism Hyperlipidemia Anxiety, depression Chronic renal insufficiency creatinine ~1.3, 1.5 Hematology: on xarelto --MGUS dx 08-2016  --Multiple DVTs --PE 2 COPD asthma,home  O2 Neuro: --h/o  stroke --Tremor    GI: --GERD, diverticulosis, HH, hemorrhoids, MSK: --Osteoporosis, normal vitamin D . T score 07-2015: -3.6,  Prolia done 06-2017 --DJD --Spinal stenosis, MRI 2009  PLAN:  Parotitis: S/P Cipro, clindamycin, she continued to have a firm, enlarged left parotid gland. Will refer to ENT, further eval? FMLA: Extensive paper work completed today. The  patient needs assistance bathing, wears diapers. Recent labs reviewed Medications reviewed. RTC 3 months

## 2017-07-04 NOTE — Assessment & Plan Note (Signed)
Parotitis: S/P Cipro, clindamycin, she continued to have a firm, enlarged left parotid gland. Will refer to ENT, further eval? FMLA: Extensive paper work completed today. The patient needs assistance bathing, wears diapers. Recent labs reviewed Medications reviewed. RTC 3 months

## 2017-07-05 ENCOUNTER — Encounter: Payer: Self-pay | Admitting: Internal Medicine

## 2017-07-06 ENCOUNTER — Telehealth: Payer: Self-pay | Admitting: *Deleted

## 2017-07-06 NOTE — Telephone Encounter (Signed)
Received FL2 Verification [x3] Physician Orders from [facility]; forwarded to provider/SLS 10/30

## 2017-07-13 NOTE — Telephone Encounter (Signed)
Forms signed and faxed to [place] at [phone]. Forms sent for scanning.

## 2017-07-27 ENCOUNTER — Telehealth: Payer: Self-pay

## 2017-07-27 NOTE — Telephone Encounter (Signed)
Physician orders received from [facility], form signed and faxed to [phone]. Form sent for scanning.

## 2017-08-05 ENCOUNTER — Telehealth: Payer: Self-pay

## 2017-08-05 ENCOUNTER — Other Ambulatory Visit: Payer: Medicare Other

## 2017-08-05 NOTE — Telephone Encounter (Signed)
Received a call from [place] from [facility] to cancel pt lab appt today. Pt is refusing to come at this time. Delcie Roch wanted to know if she could fax pt's recent labs from 11/20 and see if it is something Dr.Gudena can use. She is also requesting a copy of consultation note from last year's appt. 08/2016. Told Delcie Roch that pt is being tested for MGUS and labs to monitor her levels. Requesting to have labs done at her nursing home facility if possible. Will fax over the lab orders with instructions to send report to the cancer center. Confirmed appt with Delcie Roch about seeing Dr. Lindi Adie this coming December. She will be bringing the patient in for her appt. No further needs at this time,  Fax notes from 08/2016 along with appointment calendar and lab orders to [phone]

## 2017-08-12 ENCOUNTER — Ambulatory Visit: Payer: Medicare Other | Admitting: Hematology and Oncology

## 2017-08-12 NOTE — Assessment & Plan Note (Deleted)
Elevated monoclonal protein 0.2 g (October 2017) Quantitative immunoglobulins: IgG 1089 Kappa: 33, Lambda 31, kappa lambda ratio 1.06 Creatinine 1.43 Calcium 8.7  Plan: Follow-up in one year with blood work done ahead of time to monitor this. If she has stable findings then we may allow the patient to be followed by her primary care or nephrology.

## 2017-09-14 ENCOUNTER — Emergency Department (HOSPITAL_COMMUNITY): Payer: Medicare Other

## 2017-09-14 ENCOUNTER — Other Ambulatory Visit: Payer: Self-pay

## 2017-09-14 ENCOUNTER — Emergency Department (HOSPITAL_COMMUNITY)
Admission: EM | Admit: 2017-09-14 | Discharge: 2017-09-15 | Disposition: A | Discharge status: Home / Self Care | Payer: Medicare Other | Attending: Emergency Medicine | Admitting: Emergency Medicine

## 2017-09-14 DIAGNOSIS — J45909 Unspecified asthma, uncomplicated: Secondary | ICD-10-CM | POA: Diagnosis not present

## 2017-09-14 DIAGNOSIS — E119 Type 2 diabetes mellitus without complications: Secondary | ICD-10-CM | POA: Insufficient documentation

## 2017-09-14 DIAGNOSIS — R5383 Other fatigue: Secondary | ICD-10-CM | POA: Diagnosis not present

## 2017-09-14 DIAGNOSIS — E860 Dehydration: Secondary | ICD-10-CM | POA: Diagnosis not present

## 2017-09-14 DIAGNOSIS — E039 Hypothyroidism, unspecified: Secondary | ICD-10-CM | POA: Diagnosis not present

## 2017-09-14 DIAGNOSIS — Z87891 Personal history of nicotine dependence: Secondary | ICD-10-CM | POA: Diagnosis not present

## 2017-09-14 DIAGNOSIS — R42 Dizziness and giddiness: Secondary | ICD-10-CM | POA: Diagnosis not present

## 2017-09-14 DIAGNOSIS — J449 Chronic obstructive pulmonary disease, unspecified: Secondary | ICD-10-CM | POA: Insufficient documentation

## 2017-09-14 DIAGNOSIS — I951 Orthostatic hypotension: Secondary | ICD-10-CM

## 2017-09-14 DIAGNOSIS — Z8673 Personal history of transient ischemic attack (TIA), and cerebral infarction without residual deficits: Secondary | ICD-10-CM | POA: Diagnosis not present

## 2017-09-14 DIAGNOSIS — Z86711 Personal history of pulmonary embolism: Secondary | ICD-10-CM | POA: Diagnosis not present

## 2017-09-14 DIAGNOSIS — Z7901 Long term (current) use of anticoagulants: Secondary | ICD-10-CM | POA: Insufficient documentation

## 2017-09-14 DIAGNOSIS — R630 Anorexia: Secondary | ICD-10-CM | POA: Insufficient documentation

## 2017-09-14 DIAGNOSIS — Z79899 Other long term (current) drug therapy: Secondary | ICD-10-CM | POA: Diagnosis not present

## 2017-09-14 LAB — CBC WITH DIFFERENTIAL/PLATELET
Basophils Absolute: 0 10*3/uL (ref 0.0–0.1)
Basophils Relative: 0 %
Eosinophils Absolute: 0.2 10*3/uL (ref 0.0–0.7)
Eosinophils Relative: 2 %
HCT: 39.2 % (ref 36.0–46.0)
Hemoglobin: 11.9 g/dL — ABNORMAL LOW (ref 12.0–15.0)
Lymphocytes Relative: 12 %
Lymphs Abs: 1.1 10*3/uL (ref 0.7–4.0)
MCH: 29.3 pg (ref 26.0–34.0)
MCHC: 30.4 g/dL (ref 30.0–36.0)
MCV: 96.6 fL (ref 78.0–100.0)
Monocytes Absolute: 0.6 10*3/uL (ref 0.1–1.0)
Monocytes Relative: 6 %
Neutro Abs: 7.5 10*3/uL (ref 1.7–7.7)
Neutrophils Relative %: 80 %
Platelets: 204 10*3/uL (ref 150–400)
RBC: 4.06 MIL/uL (ref 3.87–5.11)
RDW: 15.2 % (ref 11.5–15.5)
WBC: 9.5 10*3/uL (ref 4.0–10.5)

## 2017-09-14 LAB — URINALYSIS, ROUTINE W REFLEX MICROSCOPIC
Bilirubin Urine: NEGATIVE
Glucose, UA: NEGATIVE mg/dL
Ketones, ur: NEGATIVE mg/dL
Nitrite: NEGATIVE
Protein, ur: NEGATIVE mg/dL
Specific Gravity, Urine: 1.005 (ref 1.005–1.030)
pH: 5 (ref 5.0–8.0)

## 2017-09-14 LAB — COMPREHENSIVE METABOLIC PANEL
ALT: 12 U/L — ABNORMAL LOW (ref 14–54)
AST: 19 U/L (ref 15–41)
Albumin: 3.3 g/dL — ABNORMAL LOW (ref 3.5–5.0)
Alkaline Phosphatase: 77 U/L (ref 38–126)
Anion gap: 5 (ref 5–15)
BUN: 26 mg/dL — ABNORMAL HIGH (ref 6–20)
CO2: 30 mmol/L (ref 22–32)
Calcium: 8 mg/dL — ABNORMAL LOW (ref 8.9–10.3)
Chloride: 105 mmol/L (ref 101–111)
Creatinine, Ser: 1.53 mg/dL — ABNORMAL HIGH (ref 0.44–1.00)
GFR calc Af Amer: 36 mL/min — ABNORMAL LOW (ref >60)
GFR calc non Af Amer: 31 mL/min — ABNORMAL LOW (ref >60)
Glucose, Bld: 150 mg/dL — ABNORMAL HIGH (ref 65–99)
Potassium: 3.8 mmol/L (ref 3.5–5.1)
Sodium: 140 mmol/L (ref 135–145)
Total Bilirubin: 0.2 mg/dL — ABNORMAL LOW (ref 0.3–1.2)
Total Protein: 6.8 g/dL (ref 6.5–8.1)

## 2017-09-14 LAB — CBG MONITORING, ED: Glucose-Capillary: 137 mg/dL — ABNORMAL HIGH (ref 65–99)

## 2017-09-14 LAB — I-STAT TROPONIN, ED
Troponin i, poc: 0.01 ng/mL (ref 0.00–0.08)
Troponin i, poc: 0 ng/mL (ref 0.00–0.08)

## 2017-09-14 MED ORDER — SODIUM CHLORIDE 0.9 % IV BOLUS (SEPSIS)
500.0000 mL | Freq: Once | INTRAVENOUS | Status: AC
  Administered 2017-09-14: 500 mL via INTRAVENOUS

## 2017-09-14 NOTE — ED Notes (Signed)
Writer went to ambulate pt in hall per order.  Pt sts that she had just used the restroom and that her legs was sore and she did not want to walk right then.  Writer informed pt she would be back soon in order to ambulate her.  RN notified.

## 2017-09-14 NOTE — ED Notes (Signed)
[facility] called for transport.  

## 2017-09-14 NOTE — Progress Notes (Signed)
Received Lab work from [facility] today with requested lab results -gave to Dr Lindi Adie for review.

## 2017-09-14 NOTE — Discharge Instructions (Signed)
As discussed, make sure that you stay well-hydrated, get some rest and follow-up with your primary care provider.  Return if symptoms worsen or new concerning symptoms in the meantime.

## 2017-09-14 NOTE — ED Triage Notes (Signed)
Per EMS, patient comes from assisted/independent living at [facility] on [address]. She was in the hallway and turned pale and sat back in her walker, near syncope. No fall or LOC. On arrival, EMS BP was 76/44, pt was unable to stand which is not normal for her. Alert and oriented. Hx of COPD, on 2 L [place] all the time. Stroke scale negative. 500cc NS given in route.

## 2017-09-14 NOTE — ED Notes (Addendum)
Ambulated pt from room to restroom on 2L of O2.  Pt SPO2 ranged between 83-87% while ambulating.

## 2017-09-14 NOTE — ED Notes (Signed)
Pt voided large amount of clear urine in bedpan  Pericare performed  Brief placed on pt  Tolerated well

## 2017-09-14 NOTE — ED Notes (Signed)
Ambulated pt in hall.  Pt gait unsteady. Pt sts she uses a walker at home.  Writer provided pt with a walker.  Pt ambulated from room to restroom.  Pt had to stop once to catch breath.  Pt shaky with almost a shuffle style gait.  Denies dizziness on ambulation.

## 2017-09-14 NOTE — ED Provider Notes (Signed)
[facility] DEPT Provider Note   CSN: [id number] Arrival date & time: 09/14/17  1428     History   Chief Complaint Chief Complaint  Patient presents with  . Hypotension  . Near Syncope    HPI Dawn Morrow is a 82 y.o. female with past medical history significant for DM, hypothyroidism, hypertension, hyperlipidemia, COPD, asthma, anxiety and depression presenting via EMS from independent assisted living facility with hypotension and lightheadedness.  Patient explains that she has been fighting a cold for the last 4 days and has been in bed most of the day and night.  Also reports eating and drinking less than usual. She has been experiencing difficulty walking with her walker and feeling weak in her legs bilaterally/lightheaded.  Denies any chest pain, headache, nausea, vomiting, visual disturbances, focal weakness.  Reports being short of breath at baseline no acute worsening.  She does report increased productive sputum. She is on 2 L nasal cannula at home.  Patient received 500 cc in route.  No fall, head trauma or loss of consciousness.  Patient currently on anticoagulant daily Xarelto.  HPI  Past Medical History:  Diagnosis Date  . Anxiety and depression   . Asthma   . Cataract   . COPD (chronic obstructive pulmonary disease) ([place])   . Diabetes mellitus   . Diverticulosis   . DVT (deep vein thrombosis) in pregnancy (HCC)    hx x multiple on coumadin  . GERD (gastroesophageal reflux disease)   . Hemorrhoids   . Hiatal hernia   . Hyperlipidemia   . Hypertension   . Hypothyroidism   . Osteoarthritis    h/o spinal stenosis by MRI 2009  . Osteopenia   . Pulmonary embolism (HCC)    x 2  . Stroke [facility])    [facility]  . Tubular adenoma of colon 07/2005    Patient Active Problem List   Diagnosis Date Noted  . MGUS (monoclonal gammopathy of unknown significance) 08/13/2016  . Renal failure 07/09/2015  . Follow-up ---------PCP NOTES 05/17/2015  .  Essential tremor 12/28/2014  . Ulnar neuropathy of left upper extremity 12/28/2014  . Intractable pain 06/21/2014  . Back pain 06/21/2014  . Lumbar radiculopathy 06/21/2014  . Encounter for therapeutic drug monitoring 03/14/2014  . Numbness 10/18/2013  . TIA (transient ischemic attack) 09/19/2013  . Weakness 09/18/2013  . Annual physical exam 05/30/2012  . Tremor 01/21/2012  . Failure to thrive and poor med compliance  01/21/2012  . PE (pulmonary embolism) 11/18/2010  . DVT (deep venous thrombosis) ([place]) 11/18/2010  . Long term current use of anticoagulant 11/18/2010  . ABNORMAL ELECTROCARDIOGRAM 11/10/2010  . OSTEOARTHRITIS 08/06/2010  . DIZZINESS 04/15/2010  . Diabetes ([place]) 06/04/2009  . ACNE ROSACEA 11/28/2008  . BACK PAIN 05/16/2008  . HIP PAIN, RIGHT, CHRONIC 09/15/2007  . Hypothyroidism 09/13/2007  . Dyslipidemia 09/13/2007  . Osteoporosis 07/22/2007  . DEPRESSION 09/11/2006  . HTN (hypertension) 09/11/2006  . ASTHMA 09/11/2006  . COPD (chronic obstructive pulmonary disease) ([place]) 09/11/2006  . GERD 09/11/2006  . Recurrent UTI 09/11/2006  . GREEN[place] FILTER INSERTION, HX OF 09/11/2006    Past Surgical History:  Procedure Laterality Date  . APPENDECTOMY    . CATARACT EXTRACTION    . CHOLECYSTECTOMY    . LUMBAR FUSION    . POLYPECTOMY    . TONSILLECTOMY    . TUBAL LIGATION      OB History    No data available       Home  Medications    Prior to Admission medications   Medication Sig Start Date End Date Taking? Authorizing Provider  acetaminophen (TYLENOL) 500 MG tablet Take 500 mg by mouth every 8 (eight) hours as needed for mild pain.    Yes [provider]  albuterol (PROVENTIL HFA;VENTOLIN HFA) 108 (90 BASE) MCG/ACT inhaler Inhale 2 puffs into the lungs every 6 (six) hours as needed for wheezing or shortness of breath. 08/05/15  Yes Saguier, Percell Miller, PA-C  aspirin-acetaminophen-caffeine (EXCEDRIN MIGRAINE) [phone] MG per tablet Take 2  tablets by mouth every 6 (six) hours as needed for headache (headache). Reported on 10/09/2015   Yes [provider]  atorvastatin (LIPITOR) 10 MG tablet Take 1 tablet (10 mg total) by mouth at bedtime. 10/21/15  Yes Paz, Alda Berthold, MD  buPROPion (WELLBUTRIN XL) 150 MG 24 hr tablet Take 150 mg by mouth daily. Take with 300 mg tablet every morning   Yes [provider]  buPROPion (WELLBUTRIN XL) 300 MG 24 hr tablet Take 300 mg by mouth daily.   Yes [provider]  carvedilol (COREG) 12.5 MG tablet Take 1 tablet twice a day 01/15/14  Yes Cameron Sprang, MD  dextromethorphan-guaiFENesin [facility] DM) 30-600 MG 12hr tablet Take 1 tablet by mouth 2 (two) times daily as needed for cough.   Yes [provider]  ezetimibe (ZETIA) 10 MG tablet Take 10 mg by mouth daily.   Yes [provider]  gabapentin (NEURONTIN) 300 MG capsule Take 1 cap in AM, 1 cap at noon, 2 caps at bedtime Patient taking differently: Take 300-600 mg by mouth 3 (three) times daily. Take 1 cap in AM, 1 cap in afternoon, 2 caps at bedtime. 12/21/14  Yes Cameron Sprang, MD  levothyroxine (SYNTHROID, LEVOTHROID) 100 MCG tablet Take 100 mcg by mouth daily.   Yes [provider]  linagliptin (TRADJENTA) 5 MG TABS tablet Take 1 tablet (5 mg total) by mouth at bedtime. 07/15/16  Yes Paz, Alda Berthold, MD  meclizine (ANTIVERT) 25 MG tablet Take 25 mg by mouth at bedtime as needed for dizziness.   Yes [provider]  ondansetron (ZOFRAN) 4 MG tablet Take 4 mg by mouth every 8 (eight) hours as needed for nausea or vomiting.   Yes [provider]  OXYGEN Inhale 2 L into the lungs continuous. As directed.    Yes [provider]  polyethylene glycol (MIRALAX / GLYCOLAX) packet Take 17 g by mouth daily as needed for mild constipation. Reported on 10/09/2015   Yes [provider]  Propylene Glycol (SYSTANE BALANCE) 0.6 % SOLN Place 1 drop into both eyes 3 (three) times daily as  needed (for eyes). Reported on 10/09/2015   Yes [provider]  repaglinide (PRANDIN) 0.5 MG tablet Take 1 tablet (0.5 mg total) by mouth 3 (three) times daily before meals. 01/14/16  Yes Paz, Alda Berthold, MD  rivaroxaban (XARELTO) 20 MG TABS tablet Take 1 tablet (20 mg total) by mouth daily with supper. 09/14/14  Yes Paz, Alda Berthold, MD  sertraline (ZOLOFT) 25 MG tablet Take 25 mg by mouth daily.   Yes [provider]    Family History Family History  Adopted: Yes  Problem Relation Age of Onset  . Cancer Mother        ? colon or ovarian  . Diabetes Mother   . Cancer Brother        ?  . CAD Neg Hx     Social History Social History  Tobacco Use  . Smoking status: Former Smoker    Types: Cigarettes  . Smokeless tobacco: Never Used  Substance Use Topics  . Alcohol use: No    Alcohol/week: 0.0 oz    Comment: socially  . Drug use: No     Allergies   Penicillins; Codeine; and Sulfonamide derivatives   Review of Systems Review of Systems  Constitutional: Positive for fatigue. Negative for chills and fever.  HENT: Positive for congestion. Negative for ear pain and trouble swallowing.   Eyes: Negative for photophobia, pain, redness and visual disturbance.  Respiratory: Positive for cough and shortness of breath. Negative for choking, chest tightness, wheezing and stridor.   Cardiovascular: Negative for chest pain, palpitations and leg swelling.  Gastrointestinal: Negative for abdominal distention, abdominal pain, diarrhea, nausea and vomiting.  Genitourinary: Negative for difficulty urinating, dysuria, flank pain, frequency, hematuria and pelvic pain.  Musculoskeletal: Negative for back pain, joint swelling, myalgias, neck pain and neck stiffness.  Skin: Negative for color change, pallor and rash.  Neurological: Positive for light-headedness. Negative for dizziness, tremors, seizures, syncope, facial asymmetry, speech difficulty, weakness, numbness and headaches.      Physical Exam Updated Vital Signs BP (!) 182/67 (BP Location: Right Arm)   Pulse (!) 57   Temp 97.7 F (36.5 C) (Oral)   Resp 20   Ht 5\' 5"  (1.651 m)   Wt 99.8 kg (220 lb)   SpO2 97%   BMI 36.61 kg/m   Physical Exam  Constitutional: She is oriented to person, place, and time. She appears well-developed and well-nourished. No distress.  Afebrile, nontoxic-appearing, lying comfortably in bed no acute distress.  HENT:  Head: Normocephalic and atraumatic.  Mouth/Throat: Oropharynx is clear and moist. No oropharyngeal exudate.  Eyes: Conjunctivae and EOM are normal. Pupils are equal, round, and reactive to light. Right eye exhibits no discharge. Left eye exhibits no discharge.  Neck: Normal range of motion. Neck supple.  Cardiovascular: Normal rate, regular rhythm, normal heart sounds and intact distal pulses.  No murmur heard. Pulmonary/Chest: Effort normal and breath sounds normal. No stridor. No respiratory distress. She has no wheezes. She exhibits no tenderness.  Abdominal: Soft. Bowel sounds are normal. She exhibits no distension and no mass. There is no tenderness. There is no guarding.  No CVA tenderness  Musculoskeletal: Normal range of motion. She exhibits no edema, tenderness or deformity.  Neurological: She is alert and oriented to person, place, and time. No cranial nerve deficit. She exhibits normal muscle tone. Coordination normal.  Neurologic Exam:  - Mental status: Patient is alert and cooperative. Fluent speech and words are clear. Coherent thought processes and insight is good. Patient is oriented x 4 to person, place, time and event.  - Cranial nerves:  CN III, IV, VI: pupils equally round, reactive to light both direct and conscensual. Full extra-ocular movement. CN V: motor temporalis and masseter strength intact. CN VII : muscles of facial expression intact. CN X :  midline uvula. XI strength of sternocleidomastoid and trapezius muscles 5/5, XII: tongue is  midline when protruded. - Motor: No involuntary movements. Muscle tone and bulk normal throughout. Muscle strength is 5/5 in bilateral shoulder abduction, elbow flexion and extension, grip, hip extension, flexion, leg flexion and extension, ankle dorsiflexion and plantar flexion.  - Sensory: Proprioception, light tough sensation intact in all extremities.  Patient reporting slightly less sensation to touch of the right lower extremity. - Cerebellar: rapid alternating movements and point to point movement intact in upper and lower  extremities. Normal stance and gait at baseline with walker without difficulties.  Skin: Skin is warm and dry. She is not diaphoretic. No erythema. No pallor.  Psychiatric: She has a normal mood and affect.  Nursing note and vitals reviewed.    ED Treatments / Results  Labs (all labs ordered are listed, but only abnormal results are displayed) Labs Reviewed  URINALYSIS, ROUTINE W REFLEX MICROSCOPIC - Abnormal; Notable for the following components:      Result Value   Color, Urine AMBER (*)    Hgb urine dipstick MODERATE (*)    Leukocytes, UA TRACE (*)    Bacteria, UA MANY (*)    Squamous Epithelial / LPF 0-5 (*)    All other components within normal limits  CBC WITH DIFFERENTIAL/PLATELET - Abnormal; Notable for the following components:   Hemoglobin 11.9 (*)    All other components within normal limits  COMPREHENSIVE METABOLIC PANEL - Abnormal; Notable for the following components:   Glucose, Bld 150 (*)    BUN 26 (*)    Creatinine, Ser 1.53 (*)    Calcium 8.0 (*)    Albumin 3.3 (*)    ALT 12 (*)    Total Bilirubin 0.2 (*)    GFR calc non Af Amer 31 (*)    GFR calc Af Amer 36 (*)    All other components within normal limits  CBG MONITORING, ED - Abnormal; Notable for the following components:   Glucose-Capillary 137 (*)    All other components within normal limits  URINE CULTURE  I-STAT TROPONIN, ED  I-STAT TROPONIN, ED   BUN  Date Value Ref Range  Status  09/14/2017 26 (H) 6 - 20 mg/dL Final  06/17/2017 23 6 - 23 mg/dL Final  06/14/2017 21 4 - 21 Final  02/16/2017 24 (H) 6 - 23 mg/dL Final  06/19/2016 28 (H) 6 - 23 mg/dL Final  05/26/2016 19 4 - 21 mg/dL Final   Creatinine  Date Value Ref Range Status  06/14/2017 1.3 (A) 0.5 - 1.1 Final   Creatinine, Ser  Date Value Ref Range Status  09/14/2017 1.53 (H) 0.44 - 1.00 mg/dL Final  06/17/2017 1.45 (H) 0.40 - 1.20 mg/dL Final  02/16/2017 1.47 (H) 0.40 - 1.20 mg/dL Final  06/19/2016 1.49 (H) 0.40 - 1.20 mg/dL Final    Hemoglobin  Date Value Ref Range Status  09/14/2017 11.9 (L) 12.0 - 15.0 g/dL Final  06/17/2017 12.5 12.0 - 15.0 g/dL Final  06/14/2017 12.8 12.0 - 16.0 Final  04/13/2017 13.5 12.0 - 15.0 g/dL Final     EKG  EKG Interpretation None       Radiology Dg Chest 2 View  Result Date: 09/14/2017 CLINICAL DATA:  Hypotension.  Near-syncope. EXAM: CHEST  2 VIEW COMPARISON:  Two-view chest x-ray 04/13/2017. FINDINGS: Heart size is normal. Mild pulmonary vascular congestion is present. Lung volumes are low. Mild bibasilar atelectasis is present. There is no significant consolidation. No effusions are present. Fused anterior osteophytes are present in the thoracic spine compatible with DISH IMPRESSION: 1. Low lung volumes and mild pulmonary vascular congestion without failure. Electronically Signed   By: San Morelle M.D.   On: 09/14/2017 15:59   Ct Head Wo Contrast  Result Date: 09/14/2017 CLINICAL DATA:  Altered level of consciousness EXAM: CT HEAD WITHOUT CONTRAST TECHNIQUE: Contiguous axial images were obtained from the base of the skull through the vertex without intravenous contrast. COMPARISON:  03/18/2016 FINDINGS: Brain: No evidence of acute infarction, hemorrhage, hydrocephalus, extra-axial collection  or mass lesion/mass effect. Subcortical white matter and periventricular small vessel ischemic changes. Vascular: No hyperdense vessel or unexpected  calcification. Skull: Normal. Negative for fracture or focal lesion. Sinuses/Orbits: The visualized paranasal sinuses are essentially clear. The mastoid air cells are unopacified. Other: None. IMPRESSION: No evidence of acute intracranial abnormality. Small vessel ischemic changes. Electronically Signed   By: Julian Hy M.D.   On: 09/14/2017 16:03    Procedures Procedures (including critical care time)  Medications Ordered in ED Medications  sodium chloride 0.9 % bolus 500 mL (0 mLs Intravenous Stopped 09/14/17 1646)  sodium chloride 0.9 % bolus 500 mL (0 mLs Intravenous Stopped 09/14/17 2000)     Initial Impression / Assessment and Plan / ED Course  I have reviewed the triage vital signs and the nursing notes.  Pertinent labs & imaging results that were available during my care of the patient were reviewed by me and considered in my medical decision making (see chart for details).    Patient presenting with hypotension and lightheadedness and generally feeling like she is not able to walk as long a distance as she usually does. She does report fighting a cold for the last 4 days and being mainly in bed and not eating or drinking as much as usual.  Ordered orthostatic vitals. Patient with positive orthostatic vitals from sitting to standing. Patient was given light rehydration with significant improvement in blood pressure and symptoms.  Reassuring exam, normal neuro  Will reassess after fluids  CT head negative, labs otherwise near baseline. Patient symptoms appear to be likely related to mild dehydration and poor nutrition in the last few days.  Patient was discussed with Dr. Kathrynn Humble who has seen patient and agrees with assessment and plan.  Asymptomatic bacteruria, no nitrites. Given multiple allergies and lack of symptoms, will culture and opt not to treat at this time as I do not believe that benefits of treatment with fluoroquinolone outweigh risk.  Will ambulate in  hall Patient ambulated in the hall without difficult. She reported significant improvement and was ready to go home. Will DC home with close PCP follow up.  Advised hydration.  Discussed strict return precautions and advised to return to the emergency department if experiencing any new or worsening symptoms. Instructions were understood and patient agreed with discharge plan.  Final Clinical Impressions(s) / ED Diagnoses   Final diagnoses:  Dehydration  Orthostatic hypotension    ED Discharge Orders    None       Dossie Der 09/14/17 [place], [place], MD 09/15/17 [phone]

## 2017-09-14 NOTE — ED Notes (Signed)
Bed: LN79 Expected date:  Expected time:  Means of arrival:  Comments: ems

## 2017-09-15 ENCOUNTER — Telehealth: Payer: Self-pay | Admitting: Internal Medicine

## 2017-09-15 NOTE — Telephone Encounter (Signed)
Appt scheduled for 10/01/2017- is this okay?

## 2017-09-15 NOTE — Telephone Encounter (Signed)
If she is not better needs a sooner appointment.  Please check on her

## 2017-09-15 NOTE — Telephone Encounter (Signed)
Was seen at the ER , needs f/u in few days, sooner f/u (1-2 days)  if not improving

## 2017-09-15 NOTE — Telephone Encounter (Signed)
LMOM for [facility] [phone] requesting call back from one of Pt's nurses to let us know how she is doing.

## 2017-09-16 ENCOUNTER — Emergency Department (HOSPITAL_COMMUNITY): Payer: Medicare Other

## 2017-09-16 ENCOUNTER — Inpatient Hospital Stay (HOSPITAL_COMMUNITY)
Admission: EM | Admit: 2017-09-16 | Discharge: 2017-09-20 | DRG: 316 | Payer: Medicare Other | Attending: Family Medicine | Admitting: Family Medicine

## 2017-09-16 ENCOUNTER — Encounter (HOSPITAL_COMMUNITY): Payer: Self-pay | Admitting: Emergency Medicine

## 2017-09-16 DIAGNOSIS — M8580 Other specified disorders of bone density and structure, unspecified site: Secondary | ICD-10-CM | POA: Diagnosis present

## 2017-09-16 DIAGNOSIS — E785 Hyperlipidemia, unspecified: Secondary | ICD-10-CM | POA: Diagnosis present

## 2017-09-16 DIAGNOSIS — Z79899 Other long term (current) drug therapy: Secondary | ICD-10-CM

## 2017-09-16 DIAGNOSIS — R52 Pain, unspecified: Secondary | ICD-10-CM

## 2017-09-16 DIAGNOSIS — I129 Hypertensive chronic kidney disease with stage 1 through stage 4 chronic kidney disease, or unspecified chronic kidney disease: Secondary | ICD-10-CM | POA: Diagnosis present

## 2017-09-16 DIAGNOSIS — Z23 Encounter for immunization: Secondary | ICD-10-CM

## 2017-09-16 DIAGNOSIS — I4891 Unspecified atrial fibrillation: Secondary | ICD-10-CM | POA: Diagnosis present

## 2017-09-16 DIAGNOSIS — M810 Age-related osteoporosis without current pathological fracture: Secondary | ICD-10-CM | POA: Diagnosis present

## 2017-09-16 DIAGNOSIS — M1990 Unspecified osteoarthritis, unspecified site: Secondary | ICD-10-CM | POA: Diagnosis present

## 2017-09-16 DIAGNOSIS — J449 Chronic obstructive pulmonary disease, unspecified: Secondary | ICD-10-CM | POA: Diagnosis present

## 2017-09-16 DIAGNOSIS — S0083XA Contusion of other part of head, initial encounter: Secondary | ICD-10-CM

## 2017-09-16 DIAGNOSIS — I480 Paroxysmal atrial fibrillation: Secondary | ICD-10-CM | POA: Diagnosis present

## 2017-09-16 DIAGNOSIS — Z7901 Long term (current) use of anticoagulants: Secondary | ICD-10-CM

## 2017-09-16 DIAGNOSIS — E1122 Type 2 diabetes mellitus with diabetic chronic kidney disease: Secondary | ICD-10-CM | POA: Diagnosis present

## 2017-09-16 DIAGNOSIS — Z880 Allergy status to penicillin: Secondary | ICD-10-CM

## 2017-09-16 DIAGNOSIS — Z885 Allergy status to narcotic agent status: Secondary | ICD-10-CM

## 2017-09-16 DIAGNOSIS — S0011XA Contusion of right eyelid and periocular area, initial encounter: Secondary | ICD-10-CM | POA: Diagnosis present

## 2017-09-16 DIAGNOSIS — Z7984 Long term (current) use of oral hypoglycemic drugs: Secondary | ICD-10-CM

## 2017-09-16 DIAGNOSIS — N183 Chronic kidney disease, stage 3 unspecified: Secondary | ICD-10-CM | POA: Diagnosis present

## 2017-09-16 DIAGNOSIS — I959 Hypotension, unspecified: Secondary | ICD-10-CM | POA: Diagnosis not present

## 2017-09-16 DIAGNOSIS — E669 Obesity, unspecified: Secondary | ICD-10-CM | POA: Diagnosis present

## 2017-09-16 DIAGNOSIS — E039 Hypothyroidism, unspecified: Secondary | ICD-10-CM | POA: Diagnosis present

## 2017-09-16 DIAGNOSIS — E1169 Type 2 diabetes mellitus with other specified complication: Secondary | ICD-10-CM | POA: Diagnosis present

## 2017-09-16 DIAGNOSIS — Z86718 Personal history of other venous thrombosis and embolism: Secondary | ICD-10-CM

## 2017-09-16 DIAGNOSIS — W19XXXA Unspecified fall, initial encounter: Secondary | ICD-10-CM | POA: Diagnosis present

## 2017-09-16 DIAGNOSIS — Z882 Allergy status to sulfonamides status: Secondary | ICD-10-CM

## 2017-09-16 DIAGNOSIS — Z86711 Personal history of pulmonary embolism: Secondary | ICD-10-CM

## 2017-09-16 DIAGNOSIS — R55 Syncope and collapse: Secondary | ICD-10-CM | POA: Diagnosis present

## 2017-09-16 DIAGNOSIS — Z87891 Personal history of nicotine dependence: Secondary | ICD-10-CM

## 2017-09-16 DIAGNOSIS — D472 Monoclonal gammopathy: Secondary | ICD-10-CM | POA: Diagnosis present

## 2017-09-16 DIAGNOSIS — F0390 Unspecified dementia without behavioral disturbance: Secondary | ICD-10-CM | POA: Diagnosis present

## 2017-09-16 DIAGNOSIS — K219 Gastro-esophageal reflux disease without esophagitis: Secondary | ICD-10-CM | POA: Diagnosis present

## 2017-09-16 HISTORY — DX: Dependence on supplemental oxygen: Z99.81

## 2017-09-16 LAB — URINE CULTURE

## 2017-09-16 MED ORDER — TETANUS-DIPHTH-ACELL PERTUSSIS 5-2.5-18.5 LF-MCG/0.5 IM SUSP
0.5000 mL | Freq: Once | INTRAMUSCULAR | Status: AC
  Administered 2017-09-17: 0.5 mL via INTRAMUSCULAR
  Filled 2017-09-16: qty 0.5

## 2017-09-16 NOTE — ED Triage Notes (Signed)
Per EMS, pt from [facility], with unwitnessed fall. ? LOC, hematoma to the right forehead. Pt A&O x 4. Denies nausea/vomiting/dizziness/headache. EMS vitals: BP 157/88, HR 80-90 irregular, RR 18

## 2017-09-16 NOTE — ED Provider Notes (Signed)
[facility]GENC[facility] DEPARTMENT Provider Note   CSN: [id number] Arrival date & time: 09/16/17  2307     History   Chief Complaint Chief Complaint  Patient presents with  . Fall    HPI Dawn Morrow is a 82 y.o. female.  The history is provided by the patient, a relative and the nursing home.  Fall  This is a new problem. Episode onset: Just prior to arrival. The problem occurs constantly. The problem has not changed since onset.Associated symptoms include headaches. Pertinent negatives include no chest pain. The symptoms are aggravated by standing. The symptoms are relieved by rest.   Patient presents from nursing facility after fall.  Patient had an unwitnessed fall striking her forehead.  Patient is unsure why she fell, she feels like she may have gotten lightheaded and fell. Currently, she reports headache and mild back pain, but no other acute complaints No chest pain, no shortness of breath, no abdominal pain. She takes Xarelto for PE  I spoke to nursing facility, and they report the incident happened on second shift.  And reports they were told the patient had an unwitnessed fall striking her head, but no other acute issues.  Patient has mild dementia at baseline Past Medical History:  Diagnosis Date  . Anxiety and depression   . Asthma   . Cataract   . COPD (chronic obstructive pulmonary disease) ([place])   . Diabetes mellitus   . Diverticulosis   . DVT (deep vein thrombosis) in pregnancy (HCC)    hx x multiple on coumadin  . GERD (gastroesophageal reflux disease)   . Hemorrhoids   . Hiatal hernia   . Hyperlipidemia   . Hypertension   . Hypothyroidism   . Osteoarthritis    h/o spinal stenosis by MRI 2009  . Osteopenia   . Pulmonary embolism (HCC)    x 2  . Stroke [facility])    MINI  . Tubular adenoma of colon 07/2005    Patient Active Problem List   Diagnosis Date Noted  . MGUS (monoclonal gammopathy of unknown significance) 08/13/2016  .  Renal failure 07/09/2015  . Follow-up ---------PCP NOTES 05/17/2015  . Essential tremor 12/28/2014  . Ulnar neuropathy of left upper extremity 12/28/2014  . Intractable pain 06/21/2014  . Back pain 06/21/2014  . Lumbar radiculopathy 06/21/2014  . Encounter for therapeutic drug monitoring 03/14/2014  . Numbness 10/18/2013  . TIA (transient ischemic attack) 09/19/2013  . Weakness 09/18/2013  . Annual physical exam 05/30/2012  . Tremor 01/21/2012  . Failure to thrive and poor med compliance  01/21/2012  . PE (pulmonary embolism) 11/18/2010  . DVT (deep venous thrombosis) ([place]) 11/18/2010  . Long term current use of anticoagulant 11/18/2010  . ABNORMAL ELECTROCARDIOGRAM 11/10/2010  . OSTEOARTHRITIS 08/06/2010  . DIZZINESS 04/15/2010  . Diabetes ([place]) 06/04/2009  . ACNE ROSACEA 11/28/2008  . BACK PAIN 05/16/2008  . HIP PAIN, RIGHT, CHRONIC 09/15/2007  . Hypothyroidism 09/13/2007  . Dyslipidemia 09/13/2007  . Osteoporosis 07/22/2007  . DEPRESSION 09/11/2006  . HTN (hypertension) 09/11/2006  . ASTHMA 09/11/2006  . COPD (chronic obstructive pulmonary disease) ([place]) 09/11/2006  . GERD 09/11/2006  . Recurrent UTI 09/11/2006  . GREENFIELD FILTER INSERTION, HX OF 09/11/2006    Past Surgical History:  Procedure Laterality Date  . APPENDECTOMY    . CATARACT EXTRACTION    . CHOLECYSTECTOMY    . LUMBAR FUSION    . POLYPECTOMY    . TONSILLECTOMY    . TUBAL LIGATION  OB History    No data available       Home Medications    Prior to Admission medications   Medication Sig Start Date End Date Taking? Authorizing Provider  acetaminophen (TYLENOL) 500 MG tablet Take 500 mg by mouth every 8 (eight) hours as needed for mild pain.     [provider]  albuterol (PROVENTIL HFA;VENTOLIN HFA) 108 (90 BASE) MCG/ACT inhaler Inhale 2 puffs into the lungs every 6 (six) hours as needed for wheezing or shortness of breath. 08/05/15   Saguier, Percell Miller, PA-C    aspirin-acetaminophen-caffeine (EXCEDRIN MIGRAINE) [phone] MG per tablet Take 2 tablets by mouth every 6 (six) hours as needed for headache (headache). Reported on 10/09/2015    [provider]  atorvastatin (LIPITOR) 10 MG tablet Take 1 tablet (10 mg total) by mouth at bedtime. 10/21/15   Colon Branch, MD  buPROPion (WELLBUTRIN XL) 150 MG 24 hr tablet Take 150 mg by mouth daily. Take with 300 mg tablet every morning    [provider]  buPROPion (WELLBUTRIN XL) 300 MG 24 hr tablet Take 300 mg by mouth daily.    [provider]  carvedilol (COREG) 12.5 MG tablet Take 1 tablet twice a day 01/15/14   Cameron Sprang, MD  dextromethorphan-guaiFENesin [facility] DM) 30-600 MG 12hr tablet Take 1 tablet by mouth 2 (two) times daily as needed for cough.    [provider]  ezetimibe (ZETIA) 10 MG tablet Take 10 mg by mouth daily.    [provider]  gabapentin (NEURONTIN) 300 MG capsule Take 1 cap in AM, 1 cap at noon, 2 caps at bedtime Patient taking differently: Take 300-600 mg by mouth 3 (three) times daily. Take 1 cap in AM, 1 cap in afternoon, 2 caps at bedtime. 12/21/14   Cameron Sprang, MD  levothyroxine (SYNTHROID, LEVOTHROID) 100 MCG tablet Take 100 mcg by mouth daily.    [provider]  linagliptin (TRADJENTA) 5 MG TABS tablet Take 1 tablet (5 mg total) by mouth at bedtime. 07/15/16   Colon Branch, MD  meclizine (ANTIVERT) 25 MG tablet Take 25 mg by mouth at bedtime as needed for dizziness.    [provider]  ondansetron (ZOFRAN) 4 MG tablet Take 4 mg by mouth every 8 (eight) hours as needed for nausea or vomiting.    [provider]  OXYGEN Inhale 2 L into the lungs continuous. As directed.     [provider]  polyethylene glycol (MIRALAX / GLYCOLAX) packet Take 17 g by mouth daily as needed for mild constipation. Reported on 10/09/2015    [provider]  Propylene Glycol (SYSTANE BALANCE) 0.6 % SOLN Place 1  drop into both eyes 3 (three) times daily as needed (for eyes). Reported on 10/09/2015    [provider]  repaglinide (PRANDIN) 0.5 MG tablet Take 1 tablet (0.5 mg total) by mouth 3 (three) times daily before meals. 01/14/16   Colon Branch, MD  rivaroxaban (XARELTO) 20 MG TABS tablet Take 1 tablet (20 mg total) by mouth daily with supper. 09/14/14   Colon Branch, MD  sertraline (ZOLOFT) 25 MG tablet Take 25 mg by mouth daily.    [provider]    Family History Family History  Adopted: Yes  Problem Relation Age of Onset  . Cancer Mother        ? colon or ovarian  . Diabetes Mother   . Cancer Brother        ?  Marland Kitchen  CAD Neg Hx     Social History Social History   Tobacco Use  . Smoking status: Former Smoker    Types: Cigarettes  . Smokeless tobacco: Never Used  Substance Use Topics  . Alcohol use: No    Alcohol/week: 0.0 oz    Comment: socially  . Drug use: No     Allergies   Penicillins; Codeine; and Sulfonamide derivatives   Review of Systems Review of Systems  Constitutional: Negative for fever.  Cardiovascular: Negative for chest pain.  Gastrointestinal: Negative for vomiting.  Skin: Positive for wound.  Neurological: Positive for headaches.  All other systems reviewed and are negative.    Physical Exam Updated Vital Signs BP (!) 134/100   Pulse (!) 52   Temp 97.7 F (36.5 C) (Oral)   Resp (!) 21   SpO2 98%   Physical Exam CONSTITUTIONAL: Elderly, no acute distress HEAD: Large hematoma to right side of forehead with small amount of bleeding, no other signs of trauma EYES: EOMI/PERRL, no proptosis ENMT: Mucous membranes moist, poor dentition, no signs of trauma NECK: supple no meningeal signs SPINE/BACK:entire spine nontender, parathoracic tenderness, no bruising/crepitance/stepoffs noted to spine CV: no murmurs/rubs/gallops noted LUNGS: Lungs are clear to auscultation bilaterally, no apparent distress ABDOMEN: soft, nontender, no rebound or  guarding, bowel sounds noted throughout abdomen GU:no cva tenderness NEURO: Pt is awake/alert/appropriate, moves all extremitiesx4.  No facial droop.   EXTREMITIES: pulses normal/equal, full ROM, pelvis stable, all other extremities/joints palpated/ranged and nontender SKIN: warm, color normal PSYCH: no abnormalities of mood noted, alert and oriented to situation   ED Treatments / Results  Labs (all labs ordered are listed, but only abnormal results are displayed) Labs Reviewed  BASIC METABOLIC PANEL - Abnormal; Notable for the following components:      Result Value   Glucose, Bld 149 (*)    Creatinine, Ser 1.35 (*)    GFR calc non Af Amer 36 (*)    GFR calc Af Amer 42 (*)    All other components within normal limits  CBC WITH DIFFERENTIAL/PLATELET - Abnormal; Notable for the following components:   WBC 13.0 (*)    Neutro Abs 10.6 (*)    All other components within normal limits  URINALYSIS, ROUTINE W REFLEX MICROSCOPIC - Abnormal; Notable for the following components:   Hgb urine dipstick MODERATE (*)    Ketones, ur 5 (*)    Protein, ur 30 (*)    Bacteria, UA RARE (*)    Squamous Epithelial / LPF 0-5 (*)    All other components within normal limits    EKG  EKG Interpretation  Date/Time:  Thursday September 16 2017 23:46:40 EST Ventricular Rate:  107 PR Interval:    QRS Duration: 83 QT Interval:  348 QTC Calculation: 486 R Axis:   -7 Text Interpretation:  Atrial fibrillation Low voltage, precordial leads Anteroseptal infarct, old Minimal ST depression, diffuse leads Abnormal ekg Confirmed by Ripley Fraise [phone]) on 09/16/2017 11:54:42 PM       Radiology Dg Chest 2 View  Result Date: 09/17/2017 CLINICAL DATA:  Patient fell this evening.  Pain. EXAM: CHEST  2 VIEW COMPARISON:  09/14/2017 FINDINGS: Stable cardiomegaly with aortic atherosclerosis. No pneumonic consolidation or effusion. No pneumothorax. No acute displaced appearing fracture. Thoracic spondylosis with  osteopenia appears stable. Upper abdominal IVC filter is noted on the lateral view. IMPRESSION: Stable cardiomegaly with aortic atherosclerosis. No active pulmonary disease. No acute osseous appearing abnormality. Electronically Signed   By: Ashley Royalty  M.D.   On: 09/17/2017 01:07   Ct Head Wo Contrast  Result Date: 09/17/2017 CLINICAL DATA:  82 year old female with head trauma. EXAM: CT HEAD WITHOUT CONTRAST CT CERVICAL SPINE WITHOUT CONTRAST TECHNIQUE: Multidetector CT imaging of the head and cervical spine was performed following the standard protocol without intravenous contrast. Multiplanar CT image reconstructions of the cervical spine were also generated. COMPARISON:  Head CT dated 09/14/2017 FINDINGS: CT HEAD FINDINGS Brain: There is age-related atrophy and chronic microvascular ischemic changes as seen previously. Probable small old bilateral basal ganglia lacunar infarct. There is no acute intracranial hemorrhage. No mass effect or midline shift. No extra-axial fluid collection. Vascular: No hyperdense vessel or unexpected calcification. Skull: Normal. Negative for fracture or focal lesion. Sinuses/Orbits: No acute finding. Other: Right forehead scalp hematoma. CT CERVICAL SPINE FINDINGS Alignment: No acute subluxation. Skull base and vertebrae: No acute fracture.  Osteopenia. Soft tissues and spinal canal: No prevertebral fluid or swelling. No visible canal hematoma. Disc levels: Multilevel degenerative changes. Multilevel anterior osteophytes primarily at C4-C7 likely represents diffuse idiopathic skeletal hyperostosis. Upper chest: Negative. Other: Bilateral carotid bulb atherosclerotic plaques, severe on the right. IMPRESSION: 1. No acute intracranial hemorrhage. 2. Age-related atrophy and chronic microvascular ischemic changes. 3. No acute/traumatic cervical spine pathology. Multilevel degenerative changes. 4. Bilateral carotid bulb calcified plaques, severe on the right. Electronically Signed    By: Anner Crete M.D.   On: 09/17/2017 00:37   Ct Cervical Spine Wo Contrast  Result Date: 09/17/2017 CLINICAL DATA:  82 year old female with head trauma. EXAM: CT HEAD WITHOUT CONTRAST CT CERVICAL SPINE WITHOUT CONTRAST TECHNIQUE: Multidetector CT imaging of the head and cervical spine was performed following the standard protocol without intravenous contrast. Multiplanar CT image reconstructions of the cervical spine were also generated. COMPARISON:  Head CT dated 09/14/2017 FINDINGS: CT HEAD FINDINGS Brain: There is age-related atrophy and chronic microvascular ischemic changes as seen previously. Probable small old bilateral basal ganglia lacunar infarct. There is no acute intracranial hemorrhage. No mass effect or midline shift. No extra-axial fluid collection. Vascular: No hyperdense vessel or unexpected calcification. Skull: Normal. Negative for fracture or focal lesion. Sinuses/Orbits: No acute finding. Other: Right forehead scalp hematoma. CT CERVICAL SPINE FINDINGS Alignment: No acute subluxation. Skull base and vertebrae: No acute fracture.  Osteopenia. Soft tissues and spinal canal: No prevertebral fluid or swelling. No visible canal hematoma. Disc levels: Multilevel degenerative changes. Multilevel anterior osteophytes primarily at C4-C7 likely represents diffuse idiopathic skeletal hyperostosis. Upper chest: Negative. Other: Bilateral carotid bulb atherosclerotic plaques, severe on the right. IMPRESSION: 1. No acute intracranial hemorrhage. 2. Age-related atrophy and chronic microvascular ischemic changes. 3. No acute/traumatic cervical spine pathology. Multilevel degenerative changes. 4. Bilateral carotid bulb calcified plaques, severe on the right. Electronically Signed   By: Anner Crete M.D.   On: 09/17/2017 00:37    Procedures Procedures (including critical care time)  Medications Ordered in ED Medications  HYDROcodone-acetaminophen (NORCO/VICODIN) 5-325 MG per tablet 1 tablet  (not administered)  Tdap (BOOSTRIX) injection 0.5 mL (0.5 mLs Intramuscular Given 09/17/17 0138)  sodium chloride 0.9 % bolus 1,000 mL (1,000 mLs Intravenous New Bag/Given 09/17/17 0206)     Initial Impression / Assessment and Plan / ED Course  I have reviewed the triage vital signs and the nursing notes.  Pertinent labs & imaging results that were available during my care of the patient were reviewed by me and considered in my medical decision making (see chart for details).     11:52 PM Patient with repeat  ED visit for fall, she was seen on January 8 for a fall and had negative imaging at that time Due to the fact she is on anticoagulants with evidence of head injury, will obtain CT head, due to mild dementia will be unable to fully clear her C-spine, will get CT C-spine, will also get chest x-ray, will also check labs and urinalysis  Pt noted to be in afib on EKG, unclear if this is new, but she is already on xarelto    This patients CHA2DS2-VASc Score and unadjusted Ischemic Stroke Rate (% per year) is equal to 9.7 % stroke rate/year from a score of 6  Above score calculated as 1 point each if present [CHF, HTN, DM, Vascular=MI/PAD/Aortic Plaque, Age if 65-74, or Female] Above score calculated as 2 points each if present [Age > 75, or Stroke/TIA/TE]  1:19 AM afib appears new onset Given increased falls, new onset afib, will admit Patient reports she is having difficulty standing up, and with recent falls will admit  3:43 AM Discussed with Dr. Hal Hope for admission  Final Clinical Impressions(s) / ED Diagnoses   Final diagnoses:  Fall, initial encounter  Contusion of forehead, initial encounter  Paroxysmal atrial fibrillation [facility])    ED Discharge Orders    None       Ripley Fraise, MD 09/17/17 [phone]

## 2017-09-17 ENCOUNTER — Telehealth: Payer: Self-pay

## 2017-09-17 ENCOUNTER — Observation Stay (HOSPITAL_BASED_OUTPATIENT_CLINIC_OR_DEPARTMENT_OTHER): Payer: Medicare Other

## 2017-09-17 ENCOUNTER — Encounter (HOSPITAL_COMMUNITY): Payer: Self-pay | Admitting: Internal Medicine

## 2017-09-17 ENCOUNTER — Observation Stay (HOSPITAL_COMMUNITY): Payer: Medicare Other

## 2017-09-17 ENCOUNTER — Emergency Department (HOSPITAL_COMMUNITY): Payer: Medicare Other

## 2017-09-17 ENCOUNTER — Other Ambulatory Visit: Payer: Self-pay

## 2017-09-17 DIAGNOSIS — E034 Atrophy of thyroid (acquired): Secondary | ICD-10-CM

## 2017-09-17 DIAGNOSIS — S0011XA Contusion of right eyelid and periocular area, initial encounter: Secondary | ICD-10-CM | POA: Diagnosis not present

## 2017-09-17 DIAGNOSIS — E669 Obesity, unspecified: Secondary | ICD-10-CM

## 2017-09-17 DIAGNOSIS — I4891 Unspecified atrial fibrillation: Secondary | ICD-10-CM | POA: Diagnosis present

## 2017-09-17 DIAGNOSIS — Z880 Allergy status to penicillin: Secondary | ICD-10-CM | POA: Diagnosis not present

## 2017-09-17 DIAGNOSIS — S0083XA Contusion of other part of head, initial encounter: Secondary | ICD-10-CM | POA: Diagnosis not present

## 2017-09-17 DIAGNOSIS — I480 Paroxysmal atrial fibrillation: Secondary | ICD-10-CM | POA: Diagnosis not present

## 2017-09-17 DIAGNOSIS — Z87891 Personal history of nicotine dependence: Secondary | ICD-10-CM | POA: Diagnosis not present

## 2017-09-17 DIAGNOSIS — F0390 Unspecified dementia without behavioral disturbance: Secondary | ICD-10-CM | POA: Diagnosis not present

## 2017-09-17 DIAGNOSIS — M810 Age-related osteoporosis without current pathological fracture: Secondary | ICD-10-CM | POA: Diagnosis not present

## 2017-09-17 DIAGNOSIS — W19XXXA Unspecified fall, initial encounter: Secondary | ICD-10-CM | POA: Diagnosis not present

## 2017-09-17 DIAGNOSIS — D472 Monoclonal gammopathy: Secondary | ICD-10-CM | POA: Diagnosis not present

## 2017-09-17 DIAGNOSIS — Z86718 Personal history of other venous thrombosis and embolism: Secondary | ICD-10-CM | POA: Diagnosis not present

## 2017-09-17 DIAGNOSIS — Z7984 Long term (current) use of oral hypoglycemic drugs: Secondary | ICD-10-CM | POA: Diagnosis not present

## 2017-09-17 DIAGNOSIS — Z885 Allergy status to narcotic agent status: Secondary | ICD-10-CM | POA: Diagnosis not present

## 2017-09-17 DIAGNOSIS — Z79899 Other long term (current) drug therapy: Secondary | ICD-10-CM | POA: Diagnosis not present

## 2017-09-17 DIAGNOSIS — M1990 Unspecified osteoarthritis, unspecified site: Secondary | ICD-10-CM | POA: Diagnosis not present

## 2017-09-17 DIAGNOSIS — E039 Hypothyroidism, unspecified: Secondary | ICD-10-CM | POA: Diagnosis not present

## 2017-09-17 DIAGNOSIS — Z23 Encounter for immunization: Secondary | ICD-10-CM | POA: Diagnosis not present

## 2017-09-17 DIAGNOSIS — Z86711 Personal history of pulmonary embolism: Secondary | ICD-10-CM | POA: Diagnosis not present

## 2017-09-17 DIAGNOSIS — N183 Chronic kidney disease, stage 3 unspecified: Secondary | ICD-10-CM | POA: Diagnosis present

## 2017-09-17 DIAGNOSIS — Z7901 Long term (current) use of anticoagulants: Secondary | ICD-10-CM | POA: Diagnosis not present

## 2017-09-17 DIAGNOSIS — J449 Chronic obstructive pulmonary disease, unspecified: Secondary | ICD-10-CM | POA: Diagnosis not present

## 2017-09-17 DIAGNOSIS — E1169 Type 2 diabetes mellitus with other specified complication: Secondary | ICD-10-CM | POA: Diagnosis present

## 2017-09-17 DIAGNOSIS — E1122 Type 2 diabetes mellitus with diabetic chronic kidney disease: Secondary | ICD-10-CM | POA: Diagnosis not present

## 2017-09-17 DIAGNOSIS — R55 Syncope and collapse: Secondary | ICD-10-CM | POA: Diagnosis present

## 2017-09-17 DIAGNOSIS — E785 Hyperlipidemia, unspecified: Secondary | ICD-10-CM | POA: Diagnosis not present

## 2017-09-17 DIAGNOSIS — I129 Hypertensive chronic kidney disease with stage 1 through stage 4 chronic kidney disease, or unspecified chronic kidney disease: Secondary | ICD-10-CM | POA: Diagnosis not present

## 2017-09-17 DIAGNOSIS — Z882 Allergy status to sulfonamides status: Secondary | ICD-10-CM | POA: Diagnosis not present

## 2017-09-17 DIAGNOSIS — I959 Hypotension, unspecified: Secondary | ICD-10-CM | POA: Diagnosis not present

## 2017-09-17 LAB — CBC WITH DIFFERENTIAL/PLATELET
Basophils Absolute: 0 10*3/uL (ref 0.0–0.1)
Basophils Absolute: 0 10*3/uL (ref 0.0–0.1)
Basophils Relative: 0 %
Basophils Relative: 0 %
Eosinophils Absolute: 0 10*3/uL (ref 0.0–0.7)
Eosinophils Absolute: 0.2 10*3/uL (ref 0.0–0.7)
Eosinophils Relative: 0 %
Eosinophils Relative: 2 %
HCT: 41.1 % (ref 36.0–46.0)
HCT: 44.9 % (ref 36.0–46.0)
Hemoglobin: 12.5 g/dL (ref 12.0–15.0)
Hemoglobin: 14 g/dL (ref 12.0–15.0)
Lymphocytes Relative: 11 %
Lymphocytes Relative: 12 %
Lymphs Abs: 1.2 10*3/uL (ref 0.7–4.0)
Lymphs Abs: 1.5 10*3/uL (ref 0.7–4.0)
MCH: 28.7 pg (ref 26.0–34.0)
MCH: 29.8 pg (ref 26.0–34.0)
MCHC: 30.4 g/dL (ref 30.0–36.0)
MCHC: 31.2 g/dL (ref 30.0–36.0)
MCV: 94.3 fL (ref 78.0–100.0)
MCV: 95.5 fL (ref 78.0–100.0)
Monocytes Absolute: 0.5 10*3/uL (ref 0.1–1.0)
Monocytes Absolute: 0.7 10*3/uL (ref 0.1–1.0)
Monocytes Relative: 4 %
Monocytes Relative: 5 %
Neutro Abs: 10.6 10*3/uL — ABNORMAL HIGH (ref 1.7–7.7)
Neutro Abs: 9.2 10*3/uL — ABNORMAL HIGH (ref 1.7–7.7)
Neutrophils Relative %: 81 %
Neutrophils Relative %: 85 %
Platelets: 216 10*3/uL (ref 150–400)
Platelets: 218 10*3/uL (ref 150–400)
RBC: 4.36 MIL/uL (ref 3.87–5.11)
RBC: 4.7 MIL/uL (ref 3.87–5.11)
RDW: 15.3 % (ref 11.5–15.5)
RDW: 15.4 % (ref 11.5–15.5)
WBC: 10.9 10*3/uL — ABNORMAL HIGH (ref 4.0–10.5)
WBC: 13 10*3/uL — ABNORMAL HIGH (ref 4.0–10.5)

## 2017-09-17 LAB — COMPREHENSIVE METABOLIC PANEL
ALT: 13 U/L — ABNORMAL LOW (ref 14–54)
AST: 18 U/L (ref 15–41)
Albumin: 3.3 g/dL — ABNORMAL LOW (ref 3.5–5.0)
Alkaline Phosphatase: 79 U/L (ref 38–126)
Anion gap: 9 (ref 5–15)
BUN: 18 mg/dL (ref 6–20)
CO2: 28 mmol/L (ref 22–32)
Calcium: 8.7 mg/dL — ABNORMAL LOW (ref 8.9–10.3)
Chloride: 104 mmol/L (ref 101–111)
Creatinine, Ser: 1.35 mg/dL — ABNORMAL HIGH (ref 0.44–1.00)
GFR calc Af Amer: 42 mL/min — ABNORMAL LOW (ref >60)
GFR calc non Af Amer: 36 mL/min — ABNORMAL LOW (ref >60)
Glucose, Bld: 126 mg/dL — ABNORMAL HIGH (ref 65–99)
Potassium: 4.1 mmol/L (ref 3.5–5.1)
Sodium: 141 mmol/L (ref 135–145)
Total Bilirubin: 0.7 mg/dL (ref 0.3–1.2)
Total Protein: 6.6 g/dL (ref 6.5–8.1)

## 2017-09-17 LAB — URINALYSIS, ROUTINE W REFLEX MICROSCOPIC
Bilirubin Urine: NEGATIVE
Glucose, UA: NEGATIVE mg/dL
Ketones, ur: 5 mg/dL — AB
Leukocytes, UA: NEGATIVE
Nitrite: NEGATIVE
Protein, ur: 30 mg/dL — AB
Specific Gravity, Urine: 1.013 (ref 1.005–1.030)
pH: 5 (ref 5.0–8.0)

## 2017-09-17 LAB — BASIC METABOLIC PANEL
Anion gap: 11 (ref 5–15)
BUN: 19 mg/dL (ref 6–20)
CO2: 26 mmol/L (ref 22–32)
Calcium: 9.2 mg/dL (ref 8.9–10.3)
Chloride: 102 mmol/L (ref 101–111)
Creatinine, Ser: 1.35 mg/dL — ABNORMAL HIGH (ref 0.44–1.00)
GFR calc Af Amer: 42 mL/min — ABNORMAL LOW (ref >60)
GFR calc non Af Amer: 36 mL/min — ABNORMAL LOW (ref >60)
Glucose, Bld: 149 mg/dL — ABNORMAL HIGH (ref 65–99)
Potassium: 4.4 mmol/L (ref 3.5–5.1)
Sodium: 139 mmol/L (ref 135–145)

## 2017-09-17 LAB — MAGNESIUM: Magnesium: 1.4 mg/dL — ABNORMAL LOW (ref 1.7–2.4)

## 2017-09-17 LAB — TROPONIN I
Troponin I: <0.03 ng/mL (ref <0.03)
Troponin I: <0.03 ng/mL (ref <0.03)
Troponin I: <0.03 ng/mL (ref <0.03)

## 2017-09-17 LAB — TSH: TSH: 0.822 u[IU]/mL (ref 0.350–4.500)

## 2017-09-17 LAB — GLUCOSE, CAPILLARY: Glucose-Capillary: 134 mg/dL — ABNORMAL HIGH (ref 65–99)

## 2017-09-17 LAB — CBG MONITORING, ED
Glucose-Capillary: 113 mg/dL — ABNORMAL HIGH (ref 65–99)
Glucose-Capillary: 124 mg/dL — ABNORMAL HIGH (ref 65–99)

## 2017-09-17 LAB — ECHOCARDIOGRAM COMPLETE

## 2017-09-17 LAB — CK: Total CK: 44 U/L (ref 38–234)

## 2017-09-17 LAB — LACTIC ACID, PLASMA: Lactic Acid, Venous: 0.9 mmol/L (ref 0.5–1.9)

## 2017-09-17 MED ORDER — ONDANSETRON HCL 4 MG PO TABS: 4.0000 mg | ORAL_TABLET | Freq: Four times a day (QID) | ORAL | Status: DC | PRN

## 2017-09-17 MED ORDER — ALBUTEROL SULFATE (2.5 MG/3ML) 0.083% IN NEBU: 3.0000 mL | INHALATION_SOLUTION | Freq: Four times a day (QID) | RESPIRATORY_TRACT | Status: DC | PRN

## 2017-09-17 MED ORDER — LINAGLIPTIN 5 MG PO TABS
5.0000 mg | ORAL_TABLET | Freq: Every day | ORAL | Status: DC
  Administered 2017-09-17 – 2017-09-20 (×4): 5 mg via ORAL
  Filled 2017-09-17 (×5): qty 1

## 2017-09-17 MED ORDER — PERFLUTREN LIPID MICROSPHERE
1.0000 mL | INTRAVENOUS | Status: AC | PRN
  Administered 2017-09-17: 2 mL via INTRAVENOUS
  Filled 2017-09-17: qty 10

## 2017-09-17 MED ORDER — EZETIMIBE 10 MG PO TABS
10.0000 mg | ORAL_TABLET | Freq: Every day | ORAL | Status: DC
  Administered 2017-09-17 – 2017-09-20 (×4): 10 mg via ORAL
  Filled 2017-09-17 (×5): qty 1

## 2017-09-17 MED ORDER — ENSURE ENLIVE PO LIQD
237.0000 mL | Freq: Two times a day (BID) | ORAL | Status: DC
  Administered 2017-09-18: 237 mL via ORAL

## 2017-09-17 MED ORDER — SERTRALINE HCL 50 MG PO TABS
25.0000 mg | ORAL_TABLET | Freq: Every day | ORAL | Status: DC
  Administered 2017-09-17 – 2017-09-20 (×4): 25 mg via ORAL
  Filled 2017-09-17 (×4): qty 1

## 2017-09-17 MED ORDER — BUPROPION HCL ER (XL) 300 MG PO TB24
450.0000 mg | ORAL_TABLET | Freq: Every day | ORAL | Status: DC
  Administered 2017-09-17 – 2017-09-20 (×4): 450 mg via ORAL
  Filled 2017-09-17: qty 3
  Filled 2017-09-17 (×3): qty 1

## 2017-09-17 MED ORDER — SODIUM CHLORIDE 0.9 % IV BOLUS (SEPSIS)
1000.0000 mL | Freq: Once | INTRAVENOUS | Status: AC
  Administered 2017-09-17: 1000 mL via INTRAVENOUS

## 2017-09-17 MED ORDER — ONDANSETRON HCL 4 MG/2ML IJ SOLN: 4.0000 mg | Freq: Four times a day (QID) | INTRAMUSCULAR | Status: DC | PRN

## 2017-09-17 MED ORDER — ACETAMINOPHEN 650 MG RE SUPP: 650.0000 mg | Freq: Four times a day (QID) | RECTAL | Status: DC | PRN

## 2017-09-17 MED ORDER — GABAPENTIN 600 MG PO TABS
600.0000 mg | ORAL_TABLET | Freq: Every day | ORAL | Status: DC
  Administered 2017-09-17 – 2017-09-20 (×4): 600 mg via ORAL
  Filled 2017-09-17 (×5): qty 1

## 2017-09-17 MED ORDER — HYDROCODONE-ACETAMINOPHEN 5-325 MG PO TABS
1.0000 | ORAL_TABLET | Freq: Once | ORAL | Status: AC
  Administered 2017-09-17: 1 via ORAL
  Filled 2017-09-17: qty 1

## 2017-09-17 MED ORDER — MAGNESIUM OXIDE 400 (241.3 MG) MG PO TABS
400.0000 mg | ORAL_TABLET | Freq: Every day | ORAL | Status: AC
  Administered 2017-09-17 – 2017-09-18 (×2): 400 mg via ORAL
  Filled 2017-09-17 (×2): qty 1

## 2017-09-17 MED ORDER — INSULIN ASPART 100 UNIT/ML ~~LOC~~ SOLN
0.0000 [IU] | Freq: Three times a day (TID) | SUBCUTANEOUS | Status: DC
  Administered 2017-09-17 – 2017-09-18 (×3): 1 [IU] via SUBCUTANEOUS
  Administered 2017-09-19: 2 [IU] via SUBCUTANEOUS
  Administered 2017-09-20: 1 [IU] via SUBCUTANEOUS
  Filled 2017-09-17: qty 1

## 2017-09-17 MED ORDER — ACETAMINOPHEN 325 MG PO TABS
650.0000 mg | ORAL_TABLET | Freq: Four times a day (QID) | ORAL | Status: DC | PRN
  Administered 2017-09-18 – 2017-09-20 (×3): 650 mg via ORAL
  Filled 2017-09-17 (×3): qty 2

## 2017-09-17 MED ORDER — PROPYLENE GLYCOL 0.6 % OP SOLN: 1.0000 [drp] | Freq: Four times a day (QID) | OPHTHALMIC | Status: DC | PRN

## 2017-09-17 MED ORDER — POLYVINYL ALCOHOL 1.4 % OP SOLN: 1.0000 [drp] | Freq: Four times a day (QID) | OPHTHALMIC | Status: DC | PRN

## 2017-09-17 MED ORDER — LEVOTHYROXINE SODIUM 100 MCG PO TABS
100.0000 ug | ORAL_TABLET | Freq: Every day | ORAL | Status: DC
  Administered 2017-09-17 – 2017-09-20 (×4): 100 ug via ORAL
  Filled 2017-09-17 (×4): qty 1

## 2017-09-17 MED ORDER — GABAPENTIN 300 MG PO CAPS
300.0000 mg | ORAL_CAPSULE | Freq: Two times a day (BID) | ORAL | Status: DC
  Administered 2017-09-17 – 2017-09-20 (×8): 300 mg via ORAL
  Filled 2017-09-17 (×8): qty 1

## 2017-09-17 MED ORDER — BUPROPION HCL ER (XL) 150 MG PO TB24: 300.0000 mg | ORAL_TABLET | Freq: Every day | ORAL | Status: DC

## 2017-09-17 MED ORDER — SODIUM CHLORIDE 0.9 % IV SOLN
INTRAVENOUS | Status: AC
  Administered 2017-09-17: 05:00:00 via INTRAVENOUS

## 2017-09-17 MED ORDER — ATORVASTATIN CALCIUM 10 MG PO TABS
10.0000 mg | ORAL_TABLET | Freq: Every day | ORAL | Status: DC
  Administered 2017-09-17 – 2017-09-20 (×4): 10 mg via ORAL
  Filled 2017-09-17 (×5): qty 1

## 2017-09-17 NOTE — Telephone Encounter (Signed)
Admitted to the hospital, thank you

## 2017-09-17 NOTE — Progress Notes (Signed)
  Echocardiogram 2D Echocardiogram has been performed.  Dawn Morrow 09/17/2017, 4:00 PM

## 2017-09-17 NOTE — ED Notes (Signed)
Lunch tray ordered 

## 2017-09-17 NOTE — Telephone Encounter (Signed)
Pt back in ED yesterday for fall- new onset A fib noted- she was admitted.

## 2017-09-17 NOTE — H&P (Addendum)
History and Physical    Dawn Morrow:[id number] DOB: Aug 22, 1936 DOA: 09/16/2017  PCP: Colon Branch, MD  Patient coming from: Skilled nursing facility.  Chief Complaint: Fall.    HPI: Dawn Morrow is a 82 y.o. female with history of recurrent PE/DVT, hypertension, hyperlipidemia, diabetes mellitus type 2 was brought to the ER after patient had a fall.  Trying to reach onto the railings when patient fell.  Patient also had a recent fall 2 days ago for the exact cause was not clear.  Patient denies losing consciousness.  Denies any chest pain shortness of breath nausea vomiting or diarrhea.  Patient's daughter states that patient has been noticed to have low blood pressure recently.  No change in her medications.  No reports of any hypoglycemia.  ED Course: In the ER on exam patient has right periorbital hematoma and right frontal ecchymosis.  On trying to see the patient finds it very difficult due to weakness.  Patient is found to be hypotensive.  Patient is also found to be in A. fib which appears to be new.  Heart rate is around 90-110.  UA and chest x-ray does not show any signs of infection.  Patient at the time of my exam denies any chest pain or shortness of breath.  Patient has a right periorbital hematoma but is able to see.  The last dose of Xarelto taken was yesterday morning.  Patient is being admitted for weakness/near syncope and A. fib.  Review of Systems: As per HPI, rest all negative.   Past Medical History:  Diagnosis Date  . Anxiety and depression   . Asthma   . Cataract   . COPD (chronic obstructive pulmonary disease) ([place])   . Diabetes mellitus   . Diverticulosis   . DVT (deep vein thrombosis) in pregnancy (HCC)    hx x multiple on coumadin  . GERD (gastroesophageal reflux disease)   . Hemorrhoids   . Hiatal hernia   . Hyperlipidemia   . Hypertension   . Hypothyroidism   . Osteoarthritis    h/o spinal stenosis by MRI 2009  . Osteopenia   . Pulmonary embolism  (HCC)    x 2  . Stroke [facility])    MINI  . Tubular adenoma of colon 07/2005    Past Surgical History:  Procedure Laterality Date  . APPENDECTOMY    . CATARACT EXTRACTION    . CHOLECYSTECTOMY    . LUMBAR FUSION    . POLYPECTOMY    . TONSILLECTOMY    . TUBAL LIGATION       reports that she has quit smoking. Her smoking use included cigarettes. she has never used smokeless tobacco. She reports that she does not drink alcohol or use drugs.  Allergies  Allergen Reactions  . Penicillins Anaphylaxis  . Codeine Other (See Comments)    unknown  . Sulfonamide Derivatives Other (See Comments)    unknown    Family History  Adopted: Yes  Problem Relation Age of Onset  . Cancer Mother        ? colon or ovarian  . Diabetes Mother   . Cancer Brother        ?  . CAD Neg Hx     Prior to Admission medications   Medication Sig Start Date End Date Taking? Authorizing Provider  acetaminophen (TYLENOL) 500 MG tablet Take 500 mg by mouth every 8 (eight) hours as needed for mild pain.    Yes [provider]  albuterol (PROVENTIL HFA;VENTOLIN HFA) 108 (90 BASE) MCG/ACT inhaler Inhale 2 puffs into the lungs every 6 (six) hours as needed for wheezing or shortness of breath. 08/05/15  Yes Saguier, Percell Miller, PA-C  aspirin-acetaminophen-caffeine (EXCEDRIN MIGRAINE) [phone] MG per tablet Take 2 tablets by mouth every 6 (six) hours as needed for headache (headache). Reported on 10/09/2015   Yes [provider]  atorvastatin (LIPITOR) 10 MG tablet Take 1 tablet (10 mg total) by mouth at bedtime. 10/21/15  Yes Paz, Alda Berthold, MD  buPROPion (WELLBUTRIN XL) 150 MG 24 hr tablet Take 150 mg by mouth daily. Take with wellbutrin xl 300 mg to equal 450 mg   Yes [provider]  buPROPion (WELLBUTRIN XL) 300 MG 24 hr tablet Take 300 mg by mouth daily. Take with wellbutrin xl 150 mg to equal 450 mg   Yes [provider]  carvedilol (COREG) 12.5 MG tablet Take 1 tablet twice a  day Patient taking differently: Take 12.5 mg by mouth 2 (two) times daily with a meal.  01/15/14  Yes Cameron Sprang, MD  dextromethorphan-guaiFENesin [facility] DM) 30-600 MG 12hr tablet Take 1 tablet by mouth 2 (two) times daily as needed for cough.   Yes [provider]  ezetimibe (ZETIA) 10 MG tablet Take 10 mg by mouth daily.   Yes [provider]  gabapentin (NEURONTIN) 300 MG capsule Take 1 cap in AM, 1 cap at noon, 2 caps at bedtime Patient taking differently: Take 300-600 mg by mouth 3 (three) times daily. Take 1 capsule every morning and afternoon then take 2 capsules at bedtime. 12/21/14  Yes Cameron Sprang, MD  levothyroxine (SYNTHROID, LEVOTHROID) 100 MCG tablet Take 100 mcg by mouth daily.   Yes [provider]  linagliptin (TRADJENTA) 5 MG TABS tablet Take 1 tablet (5 mg total) by mouth at bedtime. 07/15/16  Yes Paz, Alda Berthold, MD  meclizine (ANTIVERT) 25 MG tablet Take 25 mg by mouth at bedtime as needed for dizziness.   Yes [provider]  ondansetron (ZOFRAN) 4 MG tablet Take 4 mg by mouth every 8 (eight) hours as needed for nausea or vomiting.   Yes [provider]  polyethylene glycol (MIRALAX / GLYCOLAX) packet Take 17 g by mouth daily as needed for mild constipation. Reported on 10/09/2015   Yes [provider]  Propylene Glycol (SYSTANE BALANCE) 0.6 % SOLN Place 1 drop into both eyes 4 (four) times daily as needed (for dry eyes).    Yes [provider]  repaglinide (PRANDIN) 0.5 MG tablet Take 1 tablet (0.5 mg total) by mouth 3 (three) times daily before meals. 01/14/16  Yes Paz, Alda Berthold, MD  rivaroxaban (XARELTO) 20 MG TABS tablet Take 1 tablet (20 mg total) by mouth daily with supper. 09/14/14  Yes Paz, Alda Berthold, MD  sertraline (ZOLOFT) 25 MG tablet Take 25 mg by mouth daily.   Yes [provider]  OXYGEN Inhale 2 L into the lungs continuous. As directed.     [provider]    Physical Exam: Vitals:    09/17/17 0315 09/17/17 0405 09/17/17 0410 09/17/17 0419  BP: 137/83 (!) 145/93 (!) 117/99 (!) 158/98  Pulse: 96 94 (!) 125 (!) 104  Resp: 18 (!) 21 19 15   Temp:      TempSrc:      SpO2: 99% 96% 96% 96%      Constitutional: Moderately built and nourished. Vitals:   09/17/17 8242 09/17/17 0405 09/17/17 0410 09/17/17 3536  BP: 137/83 (!) 145/93 (!) 117/99 (!) 158/98  Pulse: 96 94 (!) 125 (!) 104  Resp: 18 (!) 21 19 15   Temp:      TempSrc:      SpO2: 99% 96% 96% 96%   Eyes: Anicteric no pallor right periorbital hematoma. ENMT: Right periorbital hematoma.  Right frontal ecchymotic area. Neck: No mass felt.  No neck rigidity. Respiratory: No rhonchi or crepitations. Cardiovascular: S1-S2 heard.  No murmurs appreciated. Abdomen: Soft nontender bowel sounds present. Musculoskeletal: No edema.  No joint effusion. Skin: Right periorbital hematoma and ecchymotic area on the right frontal scalp. Neurologic: Alert awake oriented to time place and person.  Moves all extremities. Psychiatric: Appears normal.  Normal affect.   Labs on Admission: I have personally reviewed following labs and imaging studies  CBC: Recent Labs  Lab 09/14/17 1521 09/17/17 0040  WBC 9.5 13.0*  NEUTROABS 7.5 10.6*  HGB 11.9* 14.0  HCT 39.2 44.9  MCV 96.6 95.5  PLT 204 536   Basic Metabolic Panel: Recent Labs  Lab 09/14/17 1521 09/17/17 0040  NA 140 139  K 3.8 4.4  CL 105 102  CO2 30 26  GLUCOSE 150* 149*  BUN 26* 19  CREATININE 1.53* 1.35*  CALCIUM 8.0* 9.2   GFR: Estimated Creatinine Clearance: 39.5 mL/min (A) (by C-G formula based on SCr of 1.35 mg/dL (H)). Liver Function Tests: Recent Labs  Lab 09/14/17 1521  AST 19  ALT 12*  ALKPHOS 77  BILITOT 0.2*  PROT 6.8  ALBUMIN 3.3*   No results for input(s): LIPASE, AMYLASE in the last 168 hours. No results for input(s): AMMONIA in the last 168 hours. Coagulation Profile: No results for input(s): INR, PROTIME in the last 168  hours. Cardiac Enzymes: No results for input(s): CKTOTAL, CKMB, CKMBINDEX, TROPONINI in the last 168 hours. BNP (last 3 results) No results for input(s): PROBNP in the last 8760 hours. HbA1C: No results for input(s): HGBA1C in the last 72 hours. CBG: Recent Labs  Lab 09/14/17 1524  GLUCAP 137*   Lipid Profile: No results for input(s): CHOL, HDL, LDLCALC, TRIG, CHOLHDL, LDLDIRECT in the last 72 hours. Thyroid Function Tests: No results for input(s): TSH, T4TOTAL, FREET4, T3FREE, THYROIDAB in the last 72 hours. Anemia Panel: No results for input(s): VITAMINB12, FOLATE, FERRITIN, TIBC, IRON, RETICCTPCT in the last 72 hours. Urine analysis:    Component Value Date/Time   COLORURINE YELLOW 09/17/2017 0240   APPEARANCEUR CLEAR 09/17/2017 0240   LABSPEC 1.013 09/17/2017 0240   PHURINE 5.0 09/17/2017 0240   GLUCOSEU NEGATIVE 09/17/2017 0240   GLUCOSEU NEGATIVE 06/12/2014 1410   HGBUR MODERATE (A) 09/17/2017 0240   BILIRUBINUR NEGATIVE 09/17/2017 0240   BILIRUBINUR Neg 06/19/2016 1101   KETONESUR 5 (A) 09/17/2017 0240   PROTEINUR 30 (A) 09/17/2017 0240   UROBILINOGEN >=8.0 06/19/2016 1101   UROBILINOGEN 0.2 05/31/2015 1835   NITRITE NEGATIVE 09/17/2017 0240   LEUKOCYTESUR NEGATIVE 09/17/2017 0240   Sepsis Labs: @LABRCNTIP (procalcitonin:4,lacticidven:4) ) Recent Results (from the past 240 hour(s))  Urine culture     Status: Abnormal   Collection Time: 09/14/17  5:23 PM  Result Value Ref Range Status   Specimen Description URINE, CLEAN CATCH  Final   Special Requests NONE  Final   Culture MULTIPLE SPECIES PRESENT, SUGGEST RECOLLECTION (A)  Final   Report Status 09/16/2017 FINAL  Final     Radiological Exams on Admission: Dg Chest 2 View  Result Date: 09/17/2017 CLINICAL DATA:  Patient fell this evening.  Pain. EXAM:  CHEST  2 VIEW COMPARISON:  09/14/2017 FINDINGS: Stable cardiomegaly with aortic atherosclerosis. No pneumonic consolidation or effusion. No pneumothorax. No  acute displaced appearing fracture. Thoracic spondylosis with osteopenia appears stable. Upper abdominal IVC filter is noted on the lateral view. IMPRESSION: Stable cardiomegaly with aortic atherosclerosis. No active pulmonary disease. No acute osseous appearing abnormality. Electronically Signed   By: Ashley Royalty M.D.   On: 09/17/2017 01:07   Ct Head Wo Contrast  Result Date: 09/17/2017 CLINICAL DATA:  82 year old female with head trauma. EXAM: CT HEAD WITHOUT CONTRAST CT CERVICAL SPINE WITHOUT CONTRAST TECHNIQUE: Multidetector CT imaging of the head and cervical spine was performed following the standard protocol without intravenous contrast. Multiplanar CT image reconstructions of the cervical spine were also generated. COMPARISON:  Head CT dated 09/14/2017 FINDINGS: CT HEAD FINDINGS Brain: There is age-related atrophy and chronic microvascular ischemic changes as seen previously. Probable small old bilateral basal ganglia lacunar infarct. There is no acute intracranial hemorrhage. No mass effect or midline shift. No extra-axial fluid collection. Vascular: No hyperdense vessel or unexpected calcification. Skull: Normal. Negative for fracture or focal lesion. Sinuses/Orbits: No acute finding. Other: Right forehead scalp hematoma. CT CERVICAL SPINE FINDINGS Alignment: No acute subluxation. Skull base and vertebrae: No acute fracture.  Osteopenia. Soft tissues and spinal canal: No prevertebral fluid or swelling. No visible canal hematoma. Disc levels: Multilevel degenerative changes. Multilevel anterior osteophytes primarily at C4-C7 likely represents diffuse idiopathic skeletal hyperostosis. Upper chest: Negative. Other: Bilateral carotid bulb atherosclerotic plaques, severe on the right. IMPRESSION: 1. No acute intracranial hemorrhage. 2. Age-related atrophy and chronic microvascular ischemic changes. 3. No acute/traumatic cervical spine pathology. Multilevel degenerative changes. 4. Bilateral carotid bulb  calcified plaques, severe on the right. Electronically Signed   By: Anner Crete M.D.   On: 09/17/2017 00:37   Ct Cervical Spine Wo Contrast  Result Date: 09/17/2017 CLINICAL DATA:  82 year old female with head trauma. EXAM: CT HEAD WITHOUT CONTRAST CT CERVICAL SPINE WITHOUT CONTRAST TECHNIQUE: Multidetector CT imaging of the head and cervical spine was performed following the standard protocol without intravenous contrast. Multiplanar CT image reconstructions of the cervical spine were also generated. COMPARISON:  Head CT dated 09/14/2017 FINDINGS: CT HEAD FINDINGS Brain: There is age-related atrophy and chronic microvascular ischemic changes as seen previously. Probable small old bilateral basal ganglia lacunar infarct. There is no acute intracranial hemorrhage. No mass effect or midline shift. No extra-axial fluid collection. Vascular: No hyperdense vessel or unexpected calcification. Skull: Normal. Negative for fracture or focal lesion. Sinuses/Orbits: No acute finding. Other: Right forehead scalp hematoma. CT CERVICAL SPINE FINDINGS Alignment: No acute subluxation. Skull base and vertebrae: No acute fracture.  Osteopenia. Soft tissues and spinal canal: No prevertebral fluid or swelling. No visible canal hematoma. Disc levels: Multilevel degenerative changes. Multilevel anterior osteophytes primarily at C4-C7 likely represents diffuse idiopathic skeletal hyperostosis. Upper chest: Negative. Other: Bilateral carotid bulb atherosclerotic plaques, severe on the right. IMPRESSION: 1. No acute intracranial hemorrhage. 2. Age-related atrophy and chronic microvascular ischemic changes. 3. No acute/traumatic cervical spine pathology. Multilevel degenerative changes. 4. Bilateral carotid bulb calcified plaques, severe on the right. Electronically Signed   By: Anner Crete M.D.   On: 09/17/2017 00:37    EKG: Independently reviewed.  A. fib with RVR.  Assessment/Plan Principal Problem:   Near  syncope Active Problems:   Hypothyroidism   MGUS (monoclonal gammopathy of unknown significance)   Diabetes mellitus type 2 in obese (HCC)   Atrial fibrillation with RVR (HCC)    1.  Near syncope/fall/dizziness with hypotension -I think patient hypotension probably causing the symptoms.  Patient has received 2 L of normal saline fluid in the ER.  I have ordered normal saline at 125 cc/h for another 12  more hours.  Get physical therapy consult once blood pressure improves.  We will cycle cardiac markers check lactic acid levels and CK levels. 2. A. Fib -probably new onset no old documentations.  Rate is around 90-110.  Patient takes Coreg but presently holding off due to low normal blood pressure.  Once blood pressure improved to restart Coreg.  Patient is already on Xarelto which she has taken yesterday morning.  But due to right periorbital hematoma will hold off for now at least for 24 hours to ensure there is no worsening of the hematoma.  Chads 2 vasc score is 5. 3. Right periorbital hematoma -discussed with ophthalmologist will get CT orbit to ensure no fracture.  Will observe off anticoagulation for at least for the next 24 hours and if there is no further worsening probably will start her on heparin and transitioned to Xarelto. 4. History of recurrent PE and DVT -the #2 and 3 with regarding to Xarelto.  Discussed with family and patient about holding Xarelto for now.  They are agreeable. 5. Diabetes mellitus type 2 -continue on Tradjenta.  Will keep patient on sliding scale coverage.  Hold Prandin for now. 6. Hypothyroidism on Synthroid.  Check TSH. 7. Hyperlipidemia on statins.  Check CK levels since patient had a fall.  If elevated hold statins. 8. History of MGUS per the chart.  X-ray pelvis pending.   DVT prophylaxis: SCDs for now. Code Status: No CPR but intubation okay. Family Communication: Patient's daughters. Disposition Plan: Skilled nursing facility. Consults called: Will  need physical therapy once blood pressure stable. Admission status: Observation.   Rise Patience MD [facility] Pager [phone].  If 7PM-7AM, please contact night-coverage [email or web address] Password [email or web address]  09/17/2017, 4:53 AM

## 2017-09-17 NOTE — Progress Notes (Signed)
Progress Note    Dawn Morrow  UKG:[id number] DOB: Oct 30, 1935  DOA: 09/16/2017 PCP: Colon Branch, MD    Brief Narrative:   Chief complaint: F/U fall  Medical records reviewed and are as summarized below:  Dawn Morrow is an 82 y.o. 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  In the ER, on exam, patient found to have a right periorbital hematoma and right frontal ecchymosis.  She was also  found to be hypotensive and in A. fib which appears to be new.    Assessment/Plan:   Principal Problem:   Near syncope/dizziness with hypotension resulting in a fall Patient has received 2 L of normal saline fluid in the ER. Continue normal saline at 125 cc/h x 12 hours.  CK 44. 1st troponin negative. PT/OT consults. CT head negative.  Active problems: A. Fib New onset.  Rate improved 70-80's. Coreg on hold.  Patient is already on Xarelto which she has taken yesterday morning.  Due to right periorbital hematoma will hold off for now at least for 24 hours to ensure there is no worsening of the hematoma.  Chads 2 vasc score is 5.  Right periorbital hematoma/ h/o PE and DVT  Admitting MD discussed with ophthalmologist & CT of orbits done to ensure no fracture, which was negative.  Will observe off anticoagulation for at least for the next 24 hours and if there is no further worsening, can resume Xarelto.  Diabetes mellitus type 2 Continue on Tradjenta.  Will keep patient on sliding scale coverage.  Hold Prandin for now.  Hypothyroidism  Continue Synthroid. TSH OK.  Hyperlipidemia  Continue Lipitor.  Hypomagnesemia MagOx 400 mg QD x 2 days.  Stage III CKD Baseline creatinine 1.3. Current creatinine consistent with usual baseline values.  History of MGUS per the chart.  Family Communication/Anticipated D/C date and plan/Code Status   DVT prophylaxis:  SCDs ordered. Code Status: Partial code.  Family Communication: Niece at bedside. Disposition Plan: Home vs. SNF depending on PT/OT evaluations.   Medical Consultants:    None.   Anti-Infectives:    None  Subjective:   Has some soreness of right head/periorbial area. No chest pain, dyspnea, current dizziness.  Objective:    Vitals:   09/17/17 0800 09/17/17 0900 09/17/17 1000 09/17/17 1100  BP: 124/73 107/65 96/76 138/68  Pulse: 83 84 76 72  Resp: 16 15 15 17   Temp:      TempSrc:      SpO2: 97% 97% 96% 93%    Intake/Output Summary (Last 24 hours) at 09/17/2017 1134 Last data filed at 09/17/2017 0330 Gross per 24 hour  Intake 1000 ml  Output -  Net 1000 ml   There were no vitals filed for this visit.  Exam: General: No acute distress. Head/skin: Swelling and ecchymosis right forehead and periorbital area. Right eye swollen shut. Cardiovascular: HSIR No gallops or rubs. No murmurs. No JVD. Lungs: Clear to auscultation bilaterally with good air movement. No rales, rhonchi or wheezes. Abdomen: Soft, nontender, nondistended with normal active bowel sounds. No masses. No hepatosplenomegaly. Neurological: Alert and oriented 2. Moves all extremities 4 with equal strength. Non-focal. Extremities: No clubbing or cyanosis. No edema. Pedal pulses 2+. Psychiatric: Mood and affect are normal. Insight and judgment are fair.   Data Reviewed:   I have personally reviewed following labs and imaging studies:  Labs: Labs show the following:   Basic Metabolic Panel: Recent Labs  Lab 09/14/17 1521 09/17/17 0040 09/17/17 0513  NA 140 139 141  K 3.8 4.4 4.1  CL 105 102 104  CO2 30 26 28   GLUCOSE 150* 149* 126*  BUN 26* 19 18  CREATININE 1.53* 1.35* 1.35*  CALCIUM 8.0* 9.2 8.7*  MG  --   --  1.4*   GFR Estimated Creatinine Clearance: 39.5 mL/min (A) (by C-G formula based on SCr of 1.35 mg/dL (H)). Liver Function Tests: Recent Labs  Lab 09/14/17 1521  09/17/17 0513  AST 19 18  ALT 12* 13*  ALKPHOS 77 79  BILITOT 0.2* 0.7  PROT 6.8 6.6  ALBUMIN 3.3* 3.3*   CBC: Recent Labs  Lab 09/14/17 1521 09/17/17 0040 09/17/17 0513  WBC 9.5 13.0* 10.9*  NEUTROABS 7.5 10.6* 9.2*  HGB 11.9* 14.0 12.5  HCT 39.2 44.9 41.1  MCV 96.6 95.5 94.3  PLT 204 216 218   Cardiac Enzymes: Recent Labs  Lab 09/17/17 0513 09/17/17 0714  CKTOTAL  --  44  TROPONINI <0.03  --    CBG: Recent Labs  Lab 09/14/17 1524 09/17/17 0725  GLUCAP 137* 113*   Thyroid function studies: Recent Labs    09/17/17 0513  TSH 0.822   Sepsis Labs: Recent Labs  Lab 09/14/17 1521 09/17/17 0040 09/17/17 0513  WBC 9.5 13.0* 10.9*  LATICACIDVEN  --   --  0.9    Microbiology Recent Results (from the past 240 hour(s))  Urine culture     Status: Abnormal   Collection Time: 09/14/17  5:23 PM  Result Value Ref Range Status   Specimen Description URINE, CLEAN CATCH  Final   Special Requests NONE  Final   Culture MULTIPLE SPECIES PRESENT, SUGGEST RECOLLECTION (A)  Final   Report Status 09/16/2017 FINAL  Final    Procedures and diagnostic studies:  Dg Chest 2 View  Result Date: 09/17/2017 CLINICAL DATA:  Patient fell this evening.  Pain. EXAM: CHEST  2 VIEW COMPARISON:  09/14/2017 FINDINGS: Stable cardiomegaly with aortic atherosclerosis. No pneumonic consolidation or effusion. No pneumothorax. No acute displaced appearing fracture. Thoracic spondylosis with osteopenia appears stable. Upper abdominal IVC filter is noted on the lateral view. IMPRESSION: Stable cardiomegaly with aortic atherosclerosis. No active pulmonary disease. No acute osseous appearing abnormality. Electronically Signed   By: Ashley Royalty M.D.   On: 09/17/2017 01:07   Ct Head Wo Contrast  Result Date: 09/17/2017 CLINICAL DATA:  82 year old female with head trauma. EXAM: CT HEAD WITHOUT CONTRAST CT CERVICAL SPINE WITHOUT CONTRAST TECHNIQUE: Multidetector CT imaging of the head and cervical  spine was performed following the standard protocol without intravenous contrast. Multiplanar CT image reconstructions of the cervical spine were also generated. COMPARISON:  Head CT dated 09/14/2017 FINDINGS: CT HEAD FINDINGS Brain: There is age-related atrophy and chronic microvascular ischemic changes as seen previously. Probable small old bilateral basal ganglia lacunar infarct. There is no acute intracranial hemorrhage. No mass effect or midline shift. No extra-axial fluid collection. Vascular: No hyperdense vessel or unexpected calcification. Skull: Normal. Negative for fracture or focal lesion. Sinuses/Orbits: No acute finding. Other: Right forehead scalp hematoma. CT CERVICAL SPINE FINDINGS Alignment: No acute subluxation. Skull base and vertebrae: No acute fracture.  Osteopenia. Soft tissues and spinal canal: No prevertebral fluid or swelling. No visible canal hematoma. Disc levels: Multilevel degenerative changes. Multilevel anterior osteophytes primarily at C4-C7 likely represents diffuse idiopathic skeletal hyperostosis. Upper chest: Negative. Other: Bilateral carotid bulb atherosclerotic  plaques, severe on the right. IMPRESSION: 1. No acute intracranial hemorrhage. 2. Age-related atrophy and chronic microvascular ischemic changes. 3. No acute/traumatic cervical spine pathology. Multilevel degenerative changes. 4. Bilateral carotid bulb calcified plaques, severe on the right. Electronically Signed   By: Anner Crete M.D.   On: 09/17/2017 00:37   Ct Cervical Spine Wo Contrast  Result Date: 09/17/2017 CLINICAL DATA:  82 year old female with head trauma. EXAM: CT HEAD WITHOUT CONTRAST CT CERVICAL SPINE WITHOUT CONTRAST TECHNIQUE: Multidetector CT imaging of the head and cervical spine was performed following the standard protocol without intravenous contrast. Multiplanar CT image reconstructions of the cervical spine were also generated. COMPARISON:  Head CT dated 09/14/2017 FINDINGS: CT HEAD  FINDINGS Brain: There is age-related atrophy and chronic microvascular ischemic changes as seen previously. Probable small old bilateral basal ganglia lacunar infarct. There is no acute intracranial hemorrhage. No mass effect or midline shift. No extra-axial fluid collection. Vascular: No hyperdense vessel or unexpected calcification. Skull: Normal. Negative for fracture or focal lesion. Sinuses/Orbits: No acute finding. Other: Right forehead scalp hematoma. CT CERVICAL SPINE FINDINGS Alignment: No acute subluxation. Skull base and vertebrae: No acute fracture.  Osteopenia. Soft tissues and spinal canal: No prevertebral fluid or swelling. No visible canal hematoma. Disc levels: Multilevel degenerative changes. Multilevel anterior osteophytes primarily at C4-C7 likely represents diffuse idiopathic skeletal hyperostosis. Upper chest: Negative. Other: Bilateral carotid bulb atherosclerotic plaques, severe on the right. IMPRESSION: 1. No acute intracranial hemorrhage. 2. Age-related atrophy and chronic microvascular ischemic changes. 3. No acute/traumatic cervical spine pathology. Multilevel degenerative changes. 4. Bilateral carotid bulb calcified plaques, severe on the right. Electronically Signed   By: Anner Crete M.D.   On: 09/17/2017 00:37   Ct Orbits Wo Contrast  Result Date: 09/17/2017 CLINICAL DATA:  [place] yesterday striking the right face with orbital swelling. EXAM: CT ORBITS WITHOUT CONTRAST TECHNIQUE: Multidetector CT images were obtained using the standard protocol without intravenous contrast. COMPARISON:  09/16/2017 FINDINGS: Orbits: No change. Right periorbital superficial soft tissue swelling and forehead swelling consistent with post traumatic superficial hematoma. The globe is intact. Previous lens implants. Optic nerves are normal. Extra-ocular muscles are normal. Intraorbital fat is normal. No orbital emphysema. Visualized sinuses: Clear except for a few opacified ethmoid air cells  consistent with mild seasonal change. Soft tissues: No other soft tissue finding of note. Limited intracranial: Normal IMPRESSION: Superficial right periorbital and forehead soft tissue swelling secondary to post traumatic hematoma. No evidence of facial fracture. No evidence of injury to the globe or other orbital contents. Electronically Signed   By: Nelson Chimes M.D.   On: 09/17/2017 09:32    Medications:   . atorvastatin  10 mg Oral QHS  . buPROPion  450 mg Oral Daily  . ezetimibe  10 mg Oral Daily  . gabapentin  300 mg Oral BID WC  . gabapentin  600 mg Oral QHS  . insulin aspart  0-9 Units Subcutaneous TID WC  . levothyroxine  100 mcg Oral QAC breakfast  . linagliptin  5 mg Oral QHS  . sertraline  25 mg Oral Daily   Continuous Infusions: . sodium chloride 125 mL/hr at 09/17/17 0500     LOS: 0 days   Jacquelynn Cree  [facility] Pager [phone]. If unable to reach me by pager, please call my cell phone at [phone].  *Please refer to [email or web address], password [email or web address] to get updated schedule on who will round on this patient, as hospitalists switch teams weekly. If 7PM-7AM, please contact  night-coverage at [email or web address], password [email or web address] for any overnight needs.  09/17/2017, 11:34 AM

## 2017-09-17 NOTE — Telephone Encounter (Signed)
ED follow up call made to schedule appointment for patient. Patient is still in hospital.

## 2017-09-17 NOTE — ED Notes (Signed)
Patient transported to CT 

## 2017-09-18 DIAGNOSIS — F0390 Unspecified dementia without behavioral disturbance: Secondary | ICD-10-CM | POA: Diagnosis present

## 2017-09-18 DIAGNOSIS — J449 Chronic obstructive pulmonary disease, unspecified: Secondary | ICD-10-CM | POA: Diagnosis present

## 2017-09-18 DIAGNOSIS — D472 Monoclonal gammopathy: Secondary | ICD-10-CM | POA: Diagnosis present

## 2017-09-18 DIAGNOSIS — Z86718 Personal history of other venous thrombosis and embolism: Secondary | ICD-10-CM | POA: Diagnosis not present

## 2017-09-18 DIAGNOSIS — Z86711 Personal history of pulmonary embolism: Secondary | ICD-10-CM | POA: Diagnosis not present

## 2017-09-18 DIAGNOSIS — R55 Syncope and collapse: Secondary | ICD-10-CM | POA: Diagnosis not present

## 2017-09-18 DIAGNOSIS — I129 Hypertensive chronic kidney disease with stage 1 through stage 4 chronic kidney disease, or unspecified chronic kidney disease: Secondary | ICD-10-CM | POA: Diagnosis present

## 2017-09-18 DIAGNOSIS — I4891 Unspecified atrial fibrillation: Secondary | ICD-10-CM | POA: Diagnosis not present

## 2017-09-18 DIAGNOSIS — Z23 Encounter for immunization: Secondary | ICD-10-CM | POA: Diagnosis present

## 2017-09-18 DIAGNOSIS — W19XXXA Unspecified fall, initial encounter: Secondary | ICD-10-CM | POA: Diagnosis present

## 2017-09-18 DIAGNOSIS — E1169 Type 2 diabetes mellitus with other specified complication: Secondary | ICD-10-CM | POA: Diagnosis not present

## 2017-09-18 DIAGNOSIS — N183 Chronic kidney disease, stage 3 (moderate): Secondary | ICD-10-CM | POA: Diagnosis present

## 2017-09-18 DIAGNOSIS — Z880 Allergy status to penicillin: Secondary | ICD-10-CM | POA: Diagnosis not present

## 2017-09-18 DIAGNOSIS — E785 Hyperlipidemia, unspecified: Secondary | ICD-10-CM | POA: Diagnosis present

## 2017-09-18 DIAGNOSIS — S0011XA Contusion of right eyelid and periocular area, initial encounter: Secondary | ICD-10-CM | POA: Diagnosis present

## 2017-09-18 DIAGNOSIS — Z7984 Long term (current) use of oral hypoglycemic drugs: Secondary | ICD-10-CM | POA: Diagnosis not present

## 2017-09-18 DIAGNOSIS — Z7901 Long term (current) use of anticoagulants: Secondary | ICD-10-CM | POA: Diagnosis not present

## 2017-09-18 DIAGNOSIS — E1122 Type 2 diabetes mellitus with diabetic chronic kidney disease: Secondary | ICD-10-CM | POA: Diagnosis present

## 2017-09-18 DIAGNOSIS — E669 Obesity, unspecified: Secondary | ICD-10-CM | POA: Diagnosis not present

## 2017-09-18 DIAGNOSIS — I959 Hypotension, unspecified: Secondary | ICD-10-CM | POA: Diagnosis present

## 2017-09-18 DIAGNOSIS — M1990 Unspecified osteoarthritis, unspecified site: Secondary | ICD-10-CM | POA: Diagnosis present

## 2017-09-18 DIAGNOSIS — S0083XA Contusion of other part of head, initial encounter: Secondary | ICD-10-CM | POA: Diagnosis not present

## 2017-09-18 DIAGNOSIS — Z79899 Other long term (current) drug therapy: Secondary | ICD-10-CM | POA: Diagnosis not present

## 2017-09-18 DIAGNOSIS — Z882 Allergy status to sulfonamides status: Secondary | ICD-10-CM | POA: Diagnosis not present

## 2017-09-18 DIAGNOSIS — Z87891 Personal history of nicotine dependence: Secondary | ICD-10-CM | POA: Diagnosis not present

## 2017-09-18 DIAGNOSIS — M810 Age-related osteoporosis without current pathological fracture: Secondary | ICD-10-CM | POA: Diagnosis present

## 2017-09-18 DIAGNOSIS — K219 Gastro-esophageal reflux disease without esophagitis: Secondary | ICD-10-CM | POA: Diagnosis present

## 2017-09-18 DIAGNOSIS — Z885 Allergy status to narcotic agent status: Secondary | ICD-10-CM | POA: Diagnosis not present

## 2017-09-18 DIAGNOSIS — E039 Hypothyroidism, unspecified: Secondary | ICD-10-CM | POA: Diagnosis present

## 2017-09-18 DIAGNOSIS — I480 Paroxysmal atrial fibrillation: Secondary | ICD-10-CM | POA: Diagnosis present

## 2017-09-18 LAB — GLUCOSE, CAPILLARY
Glucose-Capillary: 121 mg/dL — ABNORMAL HIGH (ref 65–99)
Glucose-Capillary: 120 mg/dL — ABNORMAL HIGH (ref 65–99)
Glucose-Capillary: 128 mg/dL — ABNORMAL HIGH (ref 65–99)
Glucose-Capillary: 151 mg/dL — ABNORMAL HIGH (ref 65–99)

## 2017-09-18 LAB — BASIC METABOLIC PANEL
Anion gap: 8 (ref 5–15)
BUN: 20 mg/dL (ref 6–20)
CO2: 27 mmol/L (ref 22–32)
Calcium: 8.3 mg/dL — ABNORMAL LOW (ref 8.9–10.3)
Chloride: 104 mmol/L (ref 101–111)
Creatinine, Ser: 1.48 mg/dL — ABNORMAL HIGH (ref 0.44–1.00)
GFR calc Af Amer: 38 mL/min — ABNORMAL LOW (ref >60)
GFR calc non Af Amer: 32 mL/min — ABNORMAL LOW (ref >60)
Glucose, Bld: 130 mg/dL — ABNORMAL HIGH (ref 65–99)
Potassium: 4 mmol/L (ref 3.5–5.1)
Sodium: 139 mmol/L (ref 135–145)

## 2017-09-18 LAB — MAGNESIUM: Magnesium: 1.6 mg/dL — ABNORMAL LOW (ref 1.7–2.4)

## 2017-09-18 LAB — MRSA PCR SCREENING: MRSA by PCR: POSITIVE — AB

## 2017-09-18 MED ORDER — MUPIROCIN 2 % EX OINT
1.0000 "application " | TOPICAL_OINTMENT | Freq: Two times a day (BID) | CUTANEOUS | Status: DC
  Administered 2017-09-18 – 2017-09-20 (×7): 1 via NASAL
  Filled 2017-09-18 (×2): qty 22

## 2017-09-18 MED ORDER — CHLORHEXIDINE GLUCONATE CLOTH 2 % EX PADS
6.0000 | MEDICATED_PAD | Freq: Every day | CUTANEOUS | Status: DC
  Administered 2017-09-18 – 2017-09-20 (×3): 6 via TOPICAL

## 2017-09-18 NOTE — Clinical Social Work Note (Signed)
Clinical Social Work Assessment  Patient Details  Name: Dawn Morrow MRN: [id number] Date of Birth: [birth year]  Date of referral:  09/18/17               Reason for consult:  Discharge Planning                Permission sought to share information with:  Facility Sport and exercise psychologist, Family Supports Permission granted to share information::  Yes, Verbal Permission Granted  Name::     Merrilee Jansky  Agency::  [facility]  Relationship::  Niece  Contact Information:  [phone]  Housing/Transportation Living arrangements for the past 2 months:  [place] of Information:  Patient, Medical Team, Other (Comment Required)(Niece) Patient Interpreter Needed:  None Criminal Activity/Legal Involvement Pertinent to Current Situation/Hospitalization:  No - Comment as needed Significant Relationships:  Other Family Members Lives with:  Facility Resident Do you feel safe going back to the place where you live?  Yes Need for family participation in patient care:  Yes (Comment)  Care giving concerns:  Patient is a resident at [facility].   Social Worker assessment / plan:  CSW met with patient. Niece at bedside. CSW introduced role and explained that discharge planning would be discussed. Patient and her niece confirmed she is from [facility] and plans to return once stable for discharge. No further concerns. CSW encouraged patient and her niece to contact CSW as needed. CSW will continue to follow patient and her niece for support and facilitate discharge to ALF once medically stable.  Employment status:  Retired Forensic scientist:  Medicare PT Recommendations:  Not assessed at this time [place] / Referral to community resources:  Other (Comment Required)(Plans to return to ALF)  Patient/Family's Response to care:  Patient and her niece agreeable to return to ALF. Patient's family supportive and involved in  patient's care. Patient and her niece appreciated social work intervention.  Patient/Family's Understanding of and Emotional Response to Diagnosis, Current Treatment, and Prognosis:  Patient and her niece have a good understanding of the reason for admission and plan to return to ALF once medically stable. Patient and her niece appear happy with hospital care.  Emotional Assessment Appearance:  Appears stated age Attitude/Demeanor/Rapport:  Engaged, Gracious Affect (typically observed):  Accepting, Appropriate, Calm, Pleasant Orientation:  Oriented to Self, Oriented to Place, Oriented to  Time, Oriented to Situation Alcohol / Substance use:  Never Used Psych involvement (Current and /or in the community):  No (Comment)  Discharge Needs  Concerns to be addressed:  Care Coordination Readmission within the last 30 days:  No Current discharge risk:  None Barriers to Discharge:  Continued Medical Work up   Candie Chroman, LCSW 09/18/2017, 10:59 AM

## 2017-09-18 NOTE — Progress Notes (Signed)
Progress Note    Dawn SPRAGGINS  OJJ:[id number] DOB: June 25, 1936  DOA: 09/16/2017 PCP: Colon Branch, MD    Brief Narrative:   Chief complaint: F/U fall  Medical records reviewed and are as summarized below:  Dawn Morrow is an 82 y.o. 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  In the ER, on exam, patient found to have a right periorbital hematoma and right frontal ecchymosis.  She was also  found to be hypotensive and in A. fib which appears to be new.    Assessment/Plan:   Principal Problem:   Near syncope/dizziness with hypotension resulting in a fall Patient  received 2 L of normal saline fluid in the ER. Continue normal saline at 125 cc/h x 12 hours.  Cardiac enzymes  negative. PT/OT consults. CT head negative.  Active problems: A. Fib Chads 2 vasc score is 5 New onset.  Rate improved 70-80's. Coreg on hold.    Xarelto held for now to ensure there is no worsening of the hematoma.  .  Right periorbital hematoma/ h/o PE and DVT  Admitting MD discussed with ophthalmologist & CT of orbits done to ensure no fracture, which was negative.  Will observe off anticoagulation for at least for the next 24 hours and if there is no further worsening, can resume Xarelto tomorrow and for discharge on Monday 09/20/17 if ok.  Diabetes mellitus type 2 Continue on Tradjenta.  Will keep patient on sliding scale coverage.  Hold Prandin for now.  Hypothyroidism  Continue Synthroid. TSH OK.  Hyperlipidemia  Continue Lipitor.  Hypomagnesemia MagOx 400 mg QD x 2 days.  Stage III CKD Baseline creatinine 1.3. Current creatinine consistent with usual baseline values.  History of MGUS per the chart.  Family Communication/Anticipated D/C date and plan/Code Status   DVT prophylaxis: SCDs ordered. Code Status: Partial code.  Family Communication: Niece at  bedside. Disposition Plan: Home vs. SNF depending on PT/OT evaluations.   Medical Consultants:    None.   Anti-Infectives:    None  Subjective:   Transient c/o itchiness of forehead which is resolved  Objective:    Vitals:   09/17/17 1932 09/18/17 0011 09/18/17 0406 09/18/17 1230  BP:  131/84 113/72 (!) 125/59  Pulse:  89 85 77  Resp:  18 18 20   Temp:  98.2 F (36.8 C) 98.5 F (36.9 C) 98.5 F (36.9 C)  TempSrc:  Oral Oral Oral  SpO2: 97% 97% 97% 100%  Weight: 99.8 kg (220 lb)  98.9 kg (218 lb)   Height: 5\' 5"  (1.651 m)       Intake/Output Summary (Last 24 hours) at 09/18/2017 1310 Last data filed at 09/18/2017 0912 Gross per 24 hour  Intake 1240 ml  Output 650 ml  Net 590 ml   Filed Weights   09/17/17 1932 09/18/17 0406  Weight: 99.8 kg (220 lb) 98.9 kg (218 lb)    Exam: General: No acute distress. Head/skin: Swelling and ecchymosis right forehead and periorbital area. Right eye swollen shut. Cardiovascular: HSIR No gallops or rubs. No murmurs. No JVD. Lungs: Clear to auscultation bilaterally with good air movement. No rales, rhonchi or wheezes. Abdomen: Soft, nontender, nondistended with normal active bowel sounds. No masses. No hepatosplenomegaly. Neurological: Alert and oriented 2. Moves all extremities 4 with equal strength. Non-focal. Extremities: No clubbing or cyanosis. No edema. Pedal pulses  2+. Psychiatric: Mood and affect are normal. Insight and judgment are fair.   Data Reviewed:   I have personally reviewed following labs and imaging studies:  Labs: Labs show the following:   Basic Metabolic Panel: Recent Labs  Lab 09/14/17 1521 09/17/17 0040 09/17/17 0513 09/18/17 0336  NA 140 139 141 139  K 3.8 4.4 4.1 4.0  CL 105 102 104 104  CO2 30 26 28 27   GLUCOSE 150* 149* 126* 130*  BUN 26* 19 18 20   CREATININE 1.53* 1.35* 1.35* 1.48*  CALCIUM 8.0* 9.2 8.7* 8.3*  MG  --   --  1.4* 1.6*   GFR Estimated Creatinine Clearance: 35.9  mL/min (A) (by C-G formula based on SCr of 1.48 mg/dL (H)). Liver Function Tests: Recent Labs  Lab 09/14/17 1521 09/17/17 0513  AST 19 18  ALT 12* 13*  ALKPHOS 77 79  BILITOT 0.2* 0.7  PROT 6.8 6.6  ALBUMIN 3.3* 3.3*   CBC: Recent Labs  Lab 09/14/17 1521 09/17/17 0040 09/17/17 0513  WBC 9.5 13.0* 10.9*  NEUTROABS 7.5 10.6* 9.2*  HGB 11.9* 14.0 12.5  HCT 39.2 44.9 41.1  MCV 96.6 95.5 94.3  PLT 204 216 218   Cardiac Enzymes: Recent Labs  Lab 09/17/17 0513 09/17/17 0714 09/17/17 1547 09/17/17 2118  CKTOTAL  --  44  --   --   TROPONINI <0.03  --  <0.03 <0.03   CBG: Recent Labs  Lab 09/17/17 0725 09/17/17 1209 09/17/17 2048 09/18/17 0749 09/18/17 1118  GLUCAP 113* 124* 134* 121* 120*   Thyroid function studies: Recent Labs    09/17/17 0513  TSH 0.822   Sepsis Labs: Recent Labs  Lab 09/14/17 1521 09/17/17 0040 09/17/17 0513  WBC 9.5 13.0* 10.9*  LATICACIDVEN  --   --  0.9    Microbiology Recent Results (from the past 240 hour(s))  Urine culture     Status: Abnormal   Collection Time: 09/14/17  5:23 PM  Result Value Ref Range Status   Specimen Description URINE, CLEAN CATCH  Final   Special Requests NONE  Final   Culture MULTIPLE SPECIES PRESENT, SUGGEST RECOLLECTION (A)  Final   Report Status 09/16/2017 FINAL  Final  MRSA PCR Screening     Status: Abnormal   Collection Time: 09/17/17  9:33 PM  Result Value Ref Range Status   MRSA by PCR POSITIVE (A) NEGATIVE Final    Comment:        The GeneXpert MRSA Assay (FDA approved for NASAL specimens only), is one component of a comprehensive MRSA colonization surveillance program. It is not intended to diagnose MRSA infection nor to guide or monitor treatment for MRSA infections. RESULT CALLED TO, READ BACK BY AND VERIFIED WITH: R. DELOATCH,RN 0246 09/18/2017 T. TYSOR     Procedures and diagnostic studies:  Dg Chest 2 View  Result Date: 09/17/2017 CLINICAL DATA:  Patient fell this  evening.  Pain. EXAM: CHEST  2 VIEW COMPARISON:  09/14/2017 FINDINGS: Stable cardiomegaly with aortic atherosclerosis. No pneumonic consolidation or effusion. No pneumothorax. No acute displaced appearing fracture. Thoracic spondylosis with osteopenia appears stable. Upper abdominal IVC filter is noted on the lateral view. IMPRESSION: Stable cardiomegaly with aortic atherosclerosis. No active pulmonary disease. No acute osseous appearing abnormality. Electronically Signed   By: Ashley Royalty M.D.   On: 09/17/2017 01:07   Ct Head Wo Contrast  Result Date: 09/17/2017 CLINICAL DATA:  82 year old female with head trauma. EXAM: CT HEAD WITHOUT CONTRAST CT CERVICAL SPINE WITHOUT CONTRAST  TECHNIQUE: Multidetector CT imaging of the head and cervical spine was performed following the standard protocol without intravenous contrast. Multiplanar CT image reconstructions of the cervical spine were also generated. COMPARISON:  Head CT dated 09/14/2017 FINDINGS: CT HEAD FINDINGS Brain: There is age-related atrophy and chronic microvascular ischemic changes as seen previously. Probable small old bilateral basal ganglia lacunar infarct. There is no acute intracranial hemorrhage. No mass effect or midline shift. No extra-axial fluid collection. Vascular: No hyperdense vessel or unexpected calcification. Skull: Normal. Negative for fracture or focal lesion. Sinuses/Orbits: No acute finding. Other: Right forehead scalp hematoma. CT CERVICAL SPINE FINDINGS Alignment: No acute subluxation. Skull base and vertebrae: No acute fracture.  Osteopenia. Soft tissues and spinal canal: No prevertebral fluid or swelling. No visible canal hematoma. Disc levels: Multilevel degenerative changes. Multilevel anterior osteophytes primarily at C4-C7 likely represents diffuse idiopathic skeletal hyperostosis. Upper chest: Negative. Other: Bilateral carotid bulb atherosclerotic plaques, severe on the right. IMPRESSION: 1. No acute intracranial  hemorrhage. 2. Age-related atrophy and chronic microvascular ischemic changes. 3. No acute/traumatic cervical spine pathology. Multilevel degenerative changes. 4. Bilateral carotid bulb calcified plaques, severe on the right. Electronically Signed   By: Anner Crete M.D.   On: 09/17/2017 00:37   Ct Cervical Spine Wo Contrast  Result Date: 09/17/2017 CLINICAL DATA:  82 year old female with head trauma. EXAM: CT HEAD WITHOUT CONTRAST CT CERVICAL SPINE WITHOUT CONTRAST TECHNIQUE: Multidetector CT imaging of the head and cervical spine was performed following the standard protocol without intravenous contrast. Multiplanar CT image reconstructions of the cervical spine were also generated. COMPARISON:  Head CT dated 09/14/2017 FINDINGS: CT HEAD FINDINGS Brain: There is age-related atrophy and chronic microvascular ischemic changes as seen previously. Probable small old bilateral basal ganglia lacunar infarct. There is no acute intracranial hemorrhage. No mass effect or midline shift. No extra-axial fluid collection. Vascular: No hyperdense vessel or unexpected calcification. Skull: Normal. Negative for fracture or focal lesion. Sinuses/Orbits: No acute finding. Other: Right forehead scalp hematoma. CT CERVICAL SPINE FINDINGS Alignment: No acute subluxation. Skull base and vertebrae: No acute fracture.  Osteopenia. Soft tissues and spinal canal: No prevertebral fluid or swelling. No visible canal hematoma. Disc levels: Multilevel degenerative changes. Multilevel anterior osteophytes primarily at C4-C7 likely represents diffuse idiopathic skeletal hyperostosis. Upper chest: Negative. Other: Bilateral carotid bulb atherosclerotic plaques, severe on the right. IMPRESSION: 1. No acute intracranial hemorrhage. 2. Age-related atrophy and chronic microvascular ischemic changes. 3. No acute/traumatic cervical spine pathology. Multilevel degenerative changes. 4. Bilateral carotid bulb calcified plaques, severe on the  right. Electronically Signed   By: Anner Crete M.D.   On: 09/17/2017 00:37   Ct Orbits Wo Contrast  Result Date: 09/17/2017 CLINICAL DATA:  [place] yesterday striking the right face with orbital swelling. EXAM: CT ORBITS WITHOUT CONTRAST TECHNIQUE: Multidetector CT images were obtained using the standard protocol without intravenous contrast. COMPARISON:  09/16/2017 FINDINGS: Orbits: No change. Right periorbital superficial soft tissue swelling and forehead swelling consistent with post traumatic superficial hematoma. The globe is intact. Previous lens implants. Optic nerves are normal. Extra-ocular muscles are normal. Intraorbital fat is normal. No orbital emphysema. Visualized sinuses: Clear except for a few opacified ethmoid air cells consistent with mild seasonal change. Soft tissues: No other soft tissue finding of note. Limited intracranial: Normal IMPRESSION: Superficial right periorbital and forehead soft tissue swelling secondary to post traumatic hematoma. No evidence of facial fracture. No evidence of injury to the globe or other orbital contents. Electronically Signed   By: Jan Fireman.D.  On: 09/17/2017 09:32    Medications:   . atorvastatin  10 mg Oral QHS  . buPROPion  450 mg Oral Daily  . Chlorhexidine Gluconate Cloth  6 each Topical Q0600  . ezetimibe  10 mg Oral Daily  . gabapentin  300 mg Oral BID WC  . gabapentin  600 mg Oral QHS  . insulin aspart  0-9 Units Subcutaneous TID WC  . levothyroxine  100 mcg Oral QAC breakfast  . linagliptin  5 mg Oral QHS  . mupirocin ointment  1 application Nasal BID  . sertraline  25 mg Oral Daily   Continuous Infusions:    LOS: 0 days   Dawn Morrow,Dawn Morrow  [facility] Pager [phone]. If unable to reach me by pager, please call my cell phone at [phone].  *Please refer to [email or web address], password [email or web address] to get updated schedule on who will round on this patient, as hospitalists switch teams weekly. If 7PM-7AM, please  contact night-coverage at [email or web address], password [email or web address] for any overnight needs.  09/18/2017, 1:10 PM

## 2017-09-18 NOTE — Progress Notes (Signed)
Nutrition Brief Note  Patient identified on the Malnutrition Screening Tool (MST) Report  Wt Readings from Last 15 Encounters:  09/18/17 218 lb (98.9 kg)  09/14/17 220 lb (99.8 kg)  07/02/17 222 lb 8 oz (100.9 kg)  06/17/17 216 lb 6 oz (98.1 kg)  04/13/17 216 lb 8 oz (98.2 kg)  02/16/17 217 lb 2 oz (98.5 kg)  09/02/16 221 lb 6.4 oz (100.4 kg)  08/27/16 219 lb 3.2 oz (99.4 kg)  08/13/16 218 lb 3.2 oz (99 kg)  06/19/16 222 lb (100.7 kg)  05/06/16 220 lb 4 oz (99.9 kg)  04/23/16 215 lb (97.5 kg)  04/20/16 217 lb 9.6 oz (98.7 kg)  03/25/16 220 lb (99.8 kg)  03/18/16 221 lb (100.2 kg)   ELTON CATALANO is a 82 y.o. female with history of recurrent PE/DVT, hypertension, hyperlipidemia, diabetes mellitus type 2 was brought to the ER after patient had a fall.    Pt admitted with near syncope.   Spoke with pt and niece at bedside. Pt is a resident at [facility]. Per pt and niece, pt has a very good appetite and enjoys eating; per niece, pt often does not like to eat healthier items and enjoys salty foods such as bacon. Pt consumed about half of her breakfast this morning. Per niece, pt did not each much due to back pain. Pt also added that she typically eats a lighter breakfast and eats better during lunch or dinner meals.   Pt denies wt loss. Noted UBW is between 215-220#.   Nutrition-Focused physical exam completed. Findings are no fat depletion, no muscle depletion, and mild edema.   Labs reviewed: CBGS: 121-134 (inpatient orders for glycemic control are 0-9 units insulin aspart TID amd 5 mg linagliptin q HS) . Last Hgb A1c: 6.3 (taken 06/2017). Niece reports good blood sugar control PTA.  Body mass index is 36.28 kg/m. Patient meets criteria for obesity, class II based on current BMI.   Current diet order is Heart Healthy/ Carb Modified, patient is consuming approximately 45% of meals at this time. Labs and medications reviewed.   No nutrition interventions warranted at this time. If  nutrition issues arise, please consult RD.   Zakeria Kulzer A. Jimmye Norman, RD, LDN, CDE Pager: [phone] After hours Pager: [phone]

## 2017-09-19 DIAGNOSIS — R55 Syncope and collapse: Secondary | ICD-10-CM

## 2017-09-19 DIAGNOSIS — E039 Hypothyroidism, unspecified: Secondary | ICD-10-CM

## 2017-09-19 DIAGNOSIS — E669 Obesity, unspecified: Secondary | ICD-10-CM

## 2017-09-19 DIAGNOSIS — I480 Paroxysmal atrial fibrillation: Secondary | ICD-10-CM

## 2017-09-19 DIAGNOSIS — S0083XA Contusion of other part of head, initial encounter: Secondary | ICD-10-CM

## 2017-09-19 DIAGNOSIS — N183 Chronic kidney disease, stage 3 (moderate): Secondary | ICD-10-CM

## 2017-09-19 DIAGNOSIS — I4891 Unspecified atrial fibrillation: Secondary | ICD-10-CM

## 2017-09-19 DIAGNOSIS — E1169 Type 2 diabetes mellitus with other specified complication: Secondary | ICD-10-CM

## 2017-09-19 LAB — GLUCOSE, CAPILLARY
Glucose-Capillary: 119 mg/dL — ABNORMAL HIGH (ref 65–99)
Glucose-Capillary: 157 mg/dL — ABNORMAL HIGH (ref 65–99)
Glucose-Capillary: 133 mg/dL — ABNORMAL HIGH (ref 65–99)

## 2017-09-19 MED ORDER — CARVEDILOL 3.125 MG PO TABS
3.1250 mg | ORAL_TABLET | Freq: Two times a day (BID) | ORAL | Status: DC
  Administered 2017-09-19 – 2017-09-20 (×3): 3.125 mg via ORAL
  Filled 2017-09-19 (×3): qty 1

## 2017-09-19 MED ORDER — RIVAROXABAN 15 MG PO TABS
15.0000 mg | ORAL_TABLET | Freq: Every day | ORAL | Status: DC
  Administered 2017-09-19 – 2017-09-20 (×2): 15 mg via ORAL
  Filled 2017-09-19 (×2): qty 1

## 2017-09-19 NOTE — Evaluation (Signed)
Physical Therapy Evaluation Patient Details Name: Dawn Morrow MRN: [id number] DOB: [birth year] Today's Date: 09/19/2017   History of Present Illness  82 y.o. 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  In the ER, on exam, patient found to have a right periorbital hematoma and right frontal ecchymosis  Clinical Impression  Orders received for PT evaluation. Patient demonstrates deficits in functional mobility as indicated below. Will benefit from continued skilled PT to address deficits and maximize function. Will see as indicated and progress as tolerated.  Anticipate patient will progress well, recommend return to ALF with assist and PT services.    Follow Up Recommendations Home health PT;Supervision/Assistance - 24 hour(return to ALF with HHPT)    Equipment Recommendations  None recommended by PT    Recommendations for Other Services       Precautions / Restrictions Precautions Precautions: Fall Precaution Comments: oxygen dependent at baseline      Mobility  Bed Mobility Overal bed mobility: Needs Assistance Bed Mobility: Supine to Sit     Supine to sit: Min guard     General bed mobility comments: min guard for safety, no physical assist required  Transfers Overall transfer level: Needs assistance Equipment used: Rolling walker (2 wheeled) Transfers: Sit to/from Stand Sit to Stand: Min guard         General transfer comment: min guard for safety, no physical assist required. Increased time and effort to perform  Ambulation/Gait Ambulation/Gait assistance: Min assist Ambulation Distance (Feet): 6 Feet Assistive device: Rolling walker (2 wheeled) Gait Pattern/deviations: Antalgic;Trunk flexed;Wide base of support Gait velocity: decreased Gait velocity interpretation: Below normal speed for age/gender General Gait Details:  patient with pain in left foot limited mobility at this time  Stairs            Wheelchair Mobility    Modified Rankin (Stroke Patients Only)       Balance Overall balance assessment: History of Falls                                           Pertinent Vitals/Pain Pain Assessment: Faces Faces Pain Scale: Hurts little more Pain Location: left foot pain Pain Descriptors / Indicators: Guarding Pain Intervention(s): Limited activity within patient's tolerance;Monitored during session    Home Living Family/patient expects to be discharged to:: Assisted living([place])               Home Equipment: Walker - 4 wheels      Prior Function Level of Independence: Needs assistance   Gait / Transfers Assistance Needed: uses rollator for mobility  ADL's / Homemaking Assistance Needed: assist for bathing and meals        Hand Dominance   Dominant Hand: Right    Extremity/Trunk Assessment   Upper Extremity Assessment Upper Extremity Assessment: Overall WFL for tasks assessed    Lower Extremity Assessment Lower Extremity Assessment: LLE deficits/detail(bilateral peripheral neuropathy) LLE Deficits / Details: left foot/toe weakness       Communication   Communication: No difficulties  Cognition Arousal/Alertness: Awake/alert Behavior During Therapy: WFL for tasks assessed/performed Overall Cognitive Status: Within Functional Limits for tasks assessed  General Comments      Exercises     Assessment/Plan    PT Assessment Patient needs continued PT services  PT Problem List Decreased strength;Decreased activity tolerance;Decreased balance;Decreased mobility;Obesity;Pain       PT Treatment Interventions DME instruction;Gait training;Functional mobility training;Therapeutic activities;Therapeutic exercise;Balance training;Patient/family education    PT Goals (Current goals can be  found in the Care Plan section)  Acute Rehab PT Goals Patient Stated Goal: to be able to walk PT Goal Formulation: With patient Time For Goal Achievement: 10/03/17 Potential to Achieve Goals: Good    Frequency Min 3X/week   Barriers to discharge        Co-evaluation               AM-PAC PT "6 Clicks" Daily Activity  Outcome Measure Difficulty turning over in bed (including adjusting bedclothes, sheets and blankets)?: A Little Difficulty moving from lying on back to sitting on the side of the bed? : A Little Difficulty sitting down on and standing up from a chair with arms (e.g., wheelchair, bedside commode, etc,.)?: A Little Help needed moving to and from a bed to chair (including a wheelchair)?: A Little Help needed walking in hospital room?: A Little Help needed climbing 3-5 steps with a railing? : A Lot 6 Click Score: 17    End of Session Equipment Utilized During Treatment: Oxygen Activity Tolerance: Patient tolerated treatment well Patient left: in chair;with call bell/phone within reach;with chair alarm set Nurse Communication: Mobility status PT Visit Diagnosis: History of falling (Z91.81);Difficulty in walking, not elsewhere classified (R26.2)    Time: 9937-1696 PT Time Calculation (min) (ACUTE ONLY): 21 min   Charges:   PT Evaluation $PT Eval Moderate Complexity: 1 Mod     PT G Codes:        Alben Deeds, PT DPT  Board Certified Neurologic Specialist [phone]   Duncan Dull 09/19/2017, 8:55 AM

## 2017-09-19 NOTE — Progress Notes (Signed)
PROGRESS NOTE    Dawn Morrow  KXF:[id number] DOB: 05-Jul-1936 DOA: 09/16/2017 PCP: Colon Branch, MD   Brief Narrative: Dawn Morrow is a 82 y.o. 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 In the ER, on exam, patient found to have a right periorbital hematoma and right frontal ecchymosis. She was also  found to be hypotensive and in A. fib which appears to be new.    Assessment & Plan:   Principal Problem:   Near syncope Active Problems:   Hypothyroidism   MGUS (monoclonal gammopathy of unknown significance)   Diabetes mellitus type 2 in obese (HCC)   Atrial fibrillation with RVR (HCC)   Hypomagnesemia   CKD (chronic kidney disease), stage III (HCC)   Syncope   Near syncope Fall Likely secondary to hypotension. Given fluids in the ED. Troponin cycled and negative. PT recommending HHPT at ALF. CT head negative for hemorrhage.  Atrial fibrillation CHA2DS2-VASc Score is 5. Patient on Xarelto as an outpatient. On Coreg as an outpatient. Rate controlled. -Restart Xarelto this evening as hematoma appears stable. -Restart home Coreg at reduced dose of 3.125 mg BID  Right periorbital hematoma Improving per patient. No previous photos available for comparison but patient now able to see out of right eye. Discussed with ophthomalogy.  -Restart Xarelto today and watch for worsening of hematoma  History of PE and DVT Patient on Xarelto as an outpatient -Restart Xarelto as mentioned above  Diabetes mellitus, type 2 -Continue Tradjenta -Continue SSI  Hypothyroidism -Continue Synthroid  Hyperlipidemia -Continue Lipitor   DVT prophylaxis: Xarelto Code Status: Partial Family Communication: None at bedside Disposition Plan: Discharge to ALF likely in 24 hours if remains stable on anticoagulation   Consultants:   None  Procedures:    None  Antimicrobials:  None    Subjective: Can see out of right eye.  Objective: Vitals:   09/18/17 1230 09/18/17 1949 09/19/17 0022 09/19/17 0448  BP: (!) 125/59 (!) 98/49 116/65 108/60  Pulse: 77 79 78 86  Resp: 20 20 20 18   Temp: 98.5 F (36.9 C) 98.2 F (36.8 C) (!) 97.3 F (36.3 C) 97.9 F (36.6 C)  TempSrc: Oral Oral Oral Oral  SpO2: 100% 99% 97% 97%  Weight:    98.4 kg (217 lb)  Height:        Intake/Output Summary (Last 24 hours) at 09/19/2017 0959 Last data filed at 09/19/2017 0909 Gross per 24 hour  Intake 600 ml  Output 1050 ml  Net -450 ml   Filed Weights   09/17/17 1932 09/18/17 0406 09/19/17 0448  Weight: 99.8 kg (220 lb) 98.9 kg (218 lb) 98.4 kg (217 lb)    Examination:  General exam: Appears calm and comfortable HEENT: large area of ecchymossis around right orbit and right forehead. Respiratory system: Clear to auscultation. Respiratory effort normal. Cardiovascular system: S1 & S2 heard, Irregular rhythm with normal rate. No murmurs, rubs, gallops or clicks. Gastrointestinal system: Abdomen is nondistended, soft and nontender. No organomegaly or masses felt. Normal bowel sounds heard. Central nervous system: Alert and oriented. No focal neurological deficits. Extremities: No edema. No calf tenderness Skin: No cyanosis. No rashes Psychiatry: Judgement and insight appear normal. Mood & affect appropriate.     Data Reviewed: I have personally reviewed following labs and imaging studies  CBC: Recent Labs  Lab 09/14/17 1521 09/17/17 0040 09/17/17 0513  WBC  9.5 13.0* 10.9*  NEUTROABS 7.5 10.6* 9.2*  HGB 11.9* 14.0 12.5  HCT 39.2 44.9 41.1  MCV 96.6 95.5 94.3  PLT 204 216 176   Basic Metabolic Panel: Recent Labs  Lab 09/14/17 1521 09/17/17 0040 09/17/17 0513 09/18/17 0336  NA 140 139 141 139  K 3.8 4.4 4.1 4.0  CL 105 102 104 104  CO2 30 26 28 27   GLUCOSE 150* 149* 126* 130*  BUN 26* 19 18 20   CREATININE 1.53* 1.35* 1.35*  1.48*  CALCIUM 8.0* 9.2 8.7* 8.3*  MG  --   --  1.4* 1.6*   GFR: Estimated Creatinine Clearance: 35.8 mL/min (A) (by C-G formula based on SCr of 1.48 mg/dL (H)). Liver Function Tests: Recent Labs  Lab 09/14/17 1521 09/17/17 0513  AST 19 18  ALT 12* 13*  ALKPHOS 77 79  BILITOT 0.2* 0.7  PROT 6.8 6.6  ALBUMIN 3.3* 3.3*   No results for input(s): LIPASE, AMYLASE in the last 168 hours. No results for input(s): AMMONIA in the last 168 hours. Coagulation Profile: No results for input(s): INR, PROTIME in the last 168 hours. Cardiac Enzymes: Recent Labs  Lab 09/17/17 0513 09/17/17 0714 09/17/17 1547 09/17/17 2118  CKTOTAL  --  44  --   --   TROPONINI <0.03  --  <0.03 <0.03   BNP (last 3 results) No results for input(s): PROBNP in the last 8760 hours. HbA1C: No results for input(s): HGBA1C in the last 72 hours. CBG: Recent Labs  Lab 09/18/17 0749 09/18/17 1118 09/18/17 1704 09/18/17 2128 09/19/17 0737  GLUCAP 121* 120* 128* 151* 119*   Lipid Profile: No results for input(s): CHOL, HDL, LDLCALC, TRIG, CHOLHDL, LDLDIRECT in the last 72 hours. Thyroid Function Tests: Recent Labs    09/17/17 0513  TSH 0.822   Anemia Panel: No results for input(s): VITAMINB12, FOLATE, FERRITIN, TIBC, IRON, RETICCTPCT in the last 72 hours. Sepsis Labs: Recent Labs  Lab 09/17/17 0513  LATICACIDVEN 0.9    Recent Results (from the past 240 hour(s))  Urine culture     Status: Abnormal   Collection Time: 09/14/17  5:23 PM  Result Value Ref Range Status   Specimen Description URINE, CLEAN CATCH  Final   Special Requests NONE  Final   Culture MULTIPLE SPECIES PRESENT, SUGGEST RECOLLECTION (A)  Final   Report Status 09/16/2017 FINAL  Final  MRSA PCR Screening     Status: Abnormal   Collection Time: 09/17/17  9:33 PM  Result Value Ref Range Status   MRSA by PCR POSITIVE (A) NEGATIVE Final    Comment:        The GeneXpert MRSA Assay (FDA approved for NASAL specimens only), is one  component of a comprehensive MRSA colonization surveillance program. It is not intended to diagnose MRSA infection nor to guide or monitor treatment for MRSA infections. RESULT CALLED TO, READ BACK BY AND VERIFIED WITH: R. DELOATCH,RN 0246 09/18/2017 T. TYSOR          Radiology Studies: No results found.      Scheduled Meds: . atorvastatin  10 mg Oral QHS  . buPROPion  450 mg Oral Daily  . Chlorhexidine Gluconate Cloth  6 each Topical Q0600  . ezetimibe  10 mg Oral Daily  . gabapentin  300 mg Oral BID WC  . gabapentin  600 mg Oral QHS  . insulin aspart  0-9 Units Subcutaneous TID WC  . levothyroxine  100 mcg Oral QAC breakfast  . linagliptin  5 mg  Oral QHS  . mupirocin ointment  1 application Nasal BID  . sertraline  25 mg Oral Daily   Continuous Infusions:   LOS: 1 day     Cordelia Poche, MD [facility] 09/19/2017, 9:59 AM Pager: [phone]  If 7PM-7AM, please contact night-coverage [email or web address] Password [email or web address] 09/19/2017, 9:59 AM

## 2017-09-20 LAB — GLUCOSE, CAPILLARY
Glucose-Capillary: 104 mg/dL — ABNORMAL HIGH (ref 65–99)
Glucose-Capillary: 112 mg/dL — ABNORMAL HIGH (ref 65–99)
Glucose-Capillary: 135 mg/dL — ABNORMAL HIGH (ref 65–99)
Glucose-Capillary: 111 mg/dL — ABNORMAL HIGH (ref 65–99)

## 2017-09-20 LAB — BASIC METABOLIC PANEL
Anion gap: 9 (ref 5–15)
BUN: 23 mg/dL — ABNORMAL HIGH (ref 6–20)
CO2: 26 mmol/L (ref 22–32)
Calcium: 8.3 mg/dL — ABNORMAL LOW (ref 8.9–10.3)
Chloride: 103 mmol/L (ref 101–111)
Creatinine, Ser: 1.35 mg/dL — ABNORMAL HIGH (ref 0.44–1.00)
GFR calc Af Amer: 42 mL/min — ABNORMAL LOW (ref >60)
GFR calc non Af Amer: 36 mL/min — ABNORMAL LOW (ref >60)
Glucose, Bld: 133 mg/dL — ABNORMAL HIGH (ref 65–99)
Potassium: 4.2 mmol/L (ref 3.5–5.1)
Sodium: 138 mmol/L (ref 135–145)

## 2017-09-20 MED ORDER — CARVEDILOL 3.125 MG PO TABS: 3.1250 mg | ORAL_TABLET | Freq: Two times a day (BID) | ORAL | Status: DC

## 2017-09-20 MED ORDER — MUPIROCIN 2 % EX OINT: 1.0000 "application " | TOPICAL_OINTMENT | Freq: Two times a day (BID) | CUTANEOUS | Status: DC

## 2017-09-20 NOTE — Progress Notes (Signed)
Physical Therapy Treatment Patient Details Name: Dawn Morrow MRN: [id number] DOB: 02/20/1936 Today's Date: 09/20/2017    History of Present Illness 81 y.o. 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  In the ER, on exam, patient found to have a right periorbital hematoma and right frontal ecchymosis    PT Comments    Patient received in chair, pleasant and willing to participate in skilled PT services this morning. She continues to require min guard for functional transfers, and only min guard for gait in room today. Able to gait train approximately 10ft to bedside commode, where patient was able to toilet with S from PT and also took seated rest break, then gait trained an additional 10ft back to the chair. Note gait significantly limited by fatigue today, during second gait distance patient did require multiple standing rest breaks due to fatigue. O2 sats remained above 95% with 2LPM O2. She was left up in the chair with chair alarm set, all needs met; note recommendation at evaluation states HHPT, however patient may benefit from short term rehab as well due to significant fatigue/deconditioning.     Follow Up Recommendations  Home health PT;Supervision/Assistance - 24 hour     Equipment Recommendations  None recommended by PT    Recommendations for Other Services       Precautions / Restrictions Precautions Precautions: Fall Precaution Comments: oxygen dependent at baseline Restrictions Weight Bearing Restrictions: No    Mobility  Bed Mobility               General bed mobility comments: DNT, received up in chair   Transfers Overall transfer level: Needs assistance Equipment used: Rolling walker (2 wheeled) Transfers: Sit to/from Stand Sit to Stand: Min guard         General transfer comment: min guard for safety, VC for safety  and sequencing   Ambulation/Gait Ambulation/Gait assistance: Min guard Ambulation Distance (Feet): 20 Feet(10ft x2, 1 seated rest break ) Assistive device: Rolling walker (2 wheeled) Gait Pattern/deviations: Antalgic;Trunk flexed;Wide base of support     General Gait Details: limited by fatigue today, note O2 sats remained WNL >95% on 2LPM throughout session    Stairs            Wheelchair Mobility    Modified Rankin (Stroke Patients Only)       Balance Overall balance assessment: History of Falls;Needs assistance Sitting-balance support: Feet supported Sitting balance-Leahy Scale: Good     Standing balance support: Bilateral upper extremity supported;During functional activity Standing balance-Leahy Scale: Fair                              Cognition Arousal/Alertness: Awake/alert Behavior During Therapy: WFL for tasks assessed/performed Overall Cognitive Status: Within Functional Limits for tasks assessed                                        Exercises      General Comments        Pertinent Vitals/Pain Pain Assessment: 0-10 Pain Score: 5  Pain Location: back  Pain Descriptors / Indicators: Aching;Sore Pain Intervention(s): Limited activity within patient's tolerance;Monitored during session    Home Living                        Prior Function            PT Goals (current goals can now be found in the care plan section) Acute Rehab PT Goals Patient Stated Goal: to be able to walk PT Goal Formulation: With patient Time For Goal Achievement: 10/03/17 Potential to Achieve Goals: Good Progress towards PT goals: Progressing toward goals    Frequency    Min 3X/week      PT Plan Current plan remains appropriate    Co-evaluation              AM-PAC PT "6 Clicks" Daily Activity  Outcome Measure  Difficulty turning over in bed (including adjusting bedclothes, sheets and blankets)?: Unable Difficulty  moving from lying on back to sitting on the side of the bed? : Unable Difficulty sitting down on and standing up from a chair with arms (e.g., wheelchair, bedside commode, etc,.)?: Unable Help needed moving to and from a bed to chair (including a wheelchair)?: A Little Help needed walking in hospital room?: A Little Help needed climbing 3-5 steps with a railing? : A Lot 6 Click Score: 11    End of Session Equipment Utilized During Treatment: Oxygen;Gait belt Activity Tolerance: Patient tolerated treatment well;Patient limited by fatigue Patient left: in chair;with chair alarm set;with call bell/phone within reach   PT Visit Diagnosis: History of falling (Z91.81);Difficulty in walking, not elsewhere classified (R26.2)     Time: 5625-6389 PT Time Calculation (min) (ACUTE ONLY): 17 min  Charges:  $Gait Training: 8-22 mins                    G Codes:  Functional Assessment Tool Used: AM-PAC 6 Clicks Basic Mobility;Clinical judgement    Deniece Ree PT, DPT, CBIS  Supplemental Physical Therapist [place]   Pager [phone]

## 2017-09-20 NOTE — NC FL2 (Signed)
[place] LEVEL OF CARE SCREENING TOOL     IDENTIFICATION  Patient Name: Dawn Morrow Birthdate: September 18, 1935 Sex: female Admission Date (Current Location): 09/16/2017  [facility] and [place] Number:  Herbalist and Address:  [facility] [place] [address] [place], [place] [address]      Provider Number: [id number]  Attending Physician Name and Address:  Mariel Aloe, MD  Relative Name and Phone Number:       Current Level of Care: Hospital Recommended Level of Care: Other (Comment), Assisted Living Facility Prior Approval Number:    Date Approved/Denied:   PASRR Number:    Discharge Plan: Other (Comment)(back to alf)    Current Diagnoses: Patient Active Problem List   Diagnosis Date Noted  . Syncope 09/18/2017  . Near syncope 09/17/2017  . Diabetes mellitus type 2 in obese ([place]) 09/17/2017  . Atrial fibrillation with RVR ([place]) 09/17/2017  . Hypomagnesemia 09/17/2017  . CKD (chronic kidney disease), stage III ([place]) 09/17/2017  . Forehead contusion   . MGUS (monoclonal gammopathy of unknown significance) 08/13/2016  . Renal failure 07/09/2015  . Follow-up ---------PCP NOTES 05/17/2015  . Essential tremor 12/28/2014  . Ulnar neuropathy of left upper extremity 12/28/2014  . Intractable pain 06/21/2014  . Back pain 06/21/2014  . Lumbar radiculopathy 06/21/2014  . Encounter for therapeutic drug monitoring 03/14/2014  . Numbness 10/18/2013  . TIA (transient ischemic attack) 09/19/2013  . Weakness 09/18/2013  . Annual physical exam 05/30/2012  . Tremor 01/21/2012  . Failure to thrive and poor med compliance  01/21/2012  . PE (pulmonary embolism) 11/18/2010  . DVT (deep venous thrombosis) ([place]) 11/18/2010  . Long term current use of anticoagulant 11/18/2010  . ABNORMAL ELECTROCARDIOGRAM 11/10/2010  . OSTEOARTHRITIS 08/06/2010  . DIZZINESS 04/15/2010  . Diabetes ([place]) 06/04/2009  . ACNE ROSACEA 11/28/2008  . BACK PAIN  05/16/2008  . HIP PAIN, RIGHT, CHRONIC 09/15/2007  . Hypothyroidism 09/13/2007  . Dyslipidemia 09/13/2007  . Osteoporosis 07/22/2007  . DEPRESSION 09/11/2006  . HTN (hypertension) 09/11/2006  . ASTHMA 09/11/2006  . COPD (chronic obstructive pulmonary disease) ([place]) 09/11/2006  . GERD 09/11/2006  . Recurrent UTI 09/11/2006  . GREEN[place] FILTER INSERTION, HX OF 09/11/2006    Orientation RESPIRATION BLADDER Height & Weight     Self, Time, Situation, Place  O2(3l) External catheter Weight: 211 lb (95.7 kg) Height:  5\' 5"  (165.1 cm)  BEHAVIORAL SYMPTOMS/MOOD NEUROLOGICAL BOWEL NUTRITION STATUS      Continent Diet(heart healthy)  AMBULATORY STATUS COMMUNICATION OF NEEDS Skin   Limited Assist Verbally Bruising(bruising on face)                       Personal Care Assistance Level of Assistance  Bathing, Feeding, Dressing Bathing Assistance: Limited assistance Feeding assistance: Independent Dressing Assistance: Limited assistance     Functional Limitations Info  Sight, Hearing, Speech Sight Info: Adequate Hearing Info: Adequate Speech Info: Adequate    SPECIAL CARE FACTORS FREQUENCY  PT (By licensed PT)     PT Frequency: 3xwk OT Frequency: 3x wk            Contractures Contractures Info: Not present    Additional Factors Info  Allergies, Code Status Code Status Info: dnr Allergies Info: PENICILLINS, CODEINE, SULFONAMIDE DERIVATIVES           Current Medications (09/20/2017):  This is the current hospital active medication list Current Facility-Administered Medications  Medication Dose Route Frequency Provider Last  Rate Last Dose  . acetaminophen (TYLENOL) tablet 650 mg  650 mg Oral Q6H PRN Rise Patience, MD   650 mg at 09/20/17 1607   Or  . acetaminophen (TYLENOL) suppository 650 mg  650 mg Rectal Q6H PRN Rise Patience, MD      . albuterol (PROVENTIL) (2.5 MG/3ML) 0.083% nebulizer solution 3 mL  3 mL Inhalation Q6H PRN Rise Patience,  MD      . atorvastatin (LIPITOR) tablet 10 mg  10 mg Oral QHS Rise Patience, MD   10 mg at 09/19/17 2047  . buPROPion (WELLBUTRIN XL) 24 hr tablet 450 mg  450 mg Oral Daily Rise Patience, MD   450 mg at 09/20/17 0800  . carvedilol (COREG) tablet 3.125 mg  3.125 mg Oral BID WC Mariel Aloe, MD   3.125 mg at 09/20/17 0801  . Chlorhexidine Gluconate Cloth 2 % PADS 6 each  6 each Topical Q0600 Rama, Venetia Maxon, MD   6 each at 09/20/17 0452  . ezetimibe (ZETIA) tablet 10 mg  10 mg Oral Daily Rise Patience, MD   10 mg at 09/20/17 0800  . gabapentin (NEURONTIN) capsule 300 mg  300 mg Oral BID WC Rise Patience, MD   300 mg at 09/20/17 1244  . gabapentin (NEURONTIN) tablet 600 mg  600 mg Oral QHS Rama, Venetia Maxon, MD   600 mg at 09/19/17 2046  . insulin aspart (novoLOG) injection 0-9 Units  0-9 Units Subcutaneous TID WC Rise Patience, MD   1 Units at 09/20/17 1244  . levothyroxine (SYNTHROID, LEVOTHROID) tablet 100 mcg  100 mcg Oral QAC breakfast Rise Patience, MD   100 mcg at 09/20/17 0801  . linagliptin (TRADJENTA) tablet 5 mg  5 mg Oral QHS Rise Patience, MD   5 mg at 09/19/17 2046  . mupirocin ointment (BACTROBAN) 2 % 1 application  1 application Nasal BID Rama, Venetia Maxon, MD   1 application at 37/10/62 0801  . ondansetron (ZOFRAN) tablet 4 mg  4 mg Oral Q6H PRN Rise Patience, MD       Or  . ondansetron [facility]) injection 4 mg  4 mg Intravenous Q6H PRN Rise Patience, MD      . polyvinyl alcohol (LIQUIFILM TEARS) 1.4 % ophthalmic solution 1 drop  1 drop Both Eyes QID PRN Rama, Venetia Maxon, MD      . Rivaroxaban (XARELTO) tablet 15 mg  15 mg Oral Q supper Mariel Aloe, MD   15 mg at 09/19/17 1723  . sertraline (ZOLOFT) tablet 25 mg  25 mg Oral Daily Rise Patience, MD   25 mg at 09/20/17 6948     Discharge Medications: Please see discharge summary for a list of discharge medications.  Relevant Imaging Results:  Relevant  Lab Results:   Additional Information SS# [id number]  Wende Neighbors, LCSW

## 2017-09-20 NOTE — Progress Notes (Signed)
[facility] facility called this RN and stated FL2 was needed. Social worker made aware.

## 2017-09-20 NOTE — Progress Notes (Signed)
Patient pleasant.  Rested during night with no signs distress.  Express that she was happy she may be going home 09/20/17.

## 2017-09-20 NOTE — Discharge Summary (Addendum)
Physician Discharge Summary  Dawn Morrow ZOX:[id number] DOB: [birth year] DOA: 09/16/2017  PCP: Colon Branch, MD  Admit date: 09/16/2017 Discharge date: 09/20/2017  Admitted From: ALF Disposition: ALF  Recommendations for Outpatient Follow-up:  1. Follow up with PCP in 1 week 2. Please obtain BMP/CBC in one week 3. Please follow up on the following pending results: None  Home Health: PT, RN Equipment/Devices: None  Discharge Condition: Stable CODE STATUS: Partial. Do not perform CPR; Initiate code blue, perform intubation/mechanical ventilation, BiPAP, ACLS medications, Defibrillation/cardioversion Diet recommendation: Heart healthy/Carb modified  Brief/Interim Summary:  Admission HPI written by Rise Patience, MD   Chief Complaint: Fall.    HPI: Dawn Morrow is a 82 y.o. female with history of recurrent PE/DVT, hypertension, hyperlipidemia, diabetes mellitus type 2 was brought to the ER after patient had a fall.  Trying to reach onto the railings when patient fell.  Patient also had a recent fall 2 days ago for the exact cause was not clear.  Patient denies losing consciousness.  Denies any chest pain shortness of breath nausea vomiting or diarrhea.  Patient's daughter states that patient has been noticed to have low blood pressure recently.  No change in her medications.  No reports of any hypoglycemia.  ED Course: In the ER on exam patient has right periorbital hematoma and right frontal ecchymosis.  On trying to see the patient finds it very difficult due to weakness.  Patient is found to be hypotensive.  Patient is also found to be in A. fib which appears to be new.  Heart rate is around 90-110.  UA and chest x-ray does not show any signs of infection.  Patient at the time of my exam denies any chest pain or shortness of breath.  Patient has a right periorbital hematoma but is able to see.  The last dose of Xarelto taken was yesterday morning.  Patient is being admitted for  weakness/near syncope and A. fib.    Hospital course:  Near syncope Fall Likely secondary to hypotension. Given fluids in the ED. Troponin cycled and negative. PT recommending HHPT at ALF. CT head negative for intracranial hemorrhage hemorrhage or fracture  Paroxysmal atrial fibrillation CHA2DS2-VASc Scoreis 5. Patient on Xarelto as an outpatient. On Coreg as an outpatient. Rate controlled. Restarted Xarelto. Coreg to 3.125 BID which was initially held secondary to bradycardia.  Right periorbital hematoma No fracture. Discussed with ophthalmology. No previous photos available for comparison but patient now able to see out of right eye. Discussed with ophthomalogy. Restarted Xarelto with no worsening of hematoma. Continue to watch clinically.  History of PE and DVT Patient on Xarelto as an outpatient. Restarted prior to discharge.  Diabetes mellitus, type 2 Continue outpatient regimen.  Hypothyroidism Continue Synthroid  Hyperlipidemia Continue Lipitor  CKD stage III Stable.    Discharge Diagnoses:  Principal Problem:   Near syncope Active Problems:   Hypothyroidism   MGUS (monoclonal gammopathy of unknown significance)   Diabetes mellitus type 2 in obese [facility])   Atrial fibrillation with RVR (HCC)   Hypomagnesemia   CKD (chronic kidney disease), stage III (HCC)   Syncope    Discharge Instructions   Allergies as of 09/20/2017      Reactions   Penicillins Anaphylaxis   Codeine Other (See Comments)   unknown   Sulfonamide Derivatives Other (See Comments)   unknown      Medication List    TAKE these medications   acetaminophen 500 MG tablet Commonly known as:  TYLENOL Take 500 mg by mouth every 8 (eight) hours as needed for mild pain.   albuterol 108 (90 Base) MCG/ACT inhaler Commonly known as:  PROVENTIL HFA;VENTOLIN HFA Inhale 2 puffs into the lungs every 6 (six) hours as needed for wheezing or shortness of breath.    aspirin-acetaminophen-caffeine 250-250-65 MG tablet Commonly known as:  EXCEDRIN MIGRAINE Take 2 tablets by mouth every 6 (six) hours as needed for headache (headache). Reported on 10/09/2015   atorvastatin 10 MG tablet Commonly known as:  LIPITOR Take 1 tablet (10 mg total) by mouth at bedtime.   buPROPion 300 MG 24 hr tablet Commonly known as:  WELLBUTRIN XL Take 300 mg by mouth daily. Take with wellbutrin xl 150 mg to equal 450 mg   buPROPion 150 MG 24 hr tablet Commonly known as:  WELLBUTRIN XL Take 150 mg by mouth daily. Take with wellbutrin xl 300 mg to equal 450 mg   carvedilol 3.125 MG tablet Commonly known as:  COREG Take 1 tablet (3.125 mg total) by mouth 2 (two) times daily with a meal. What changed:    medication strength  how much to take  how to take this  when to take this  additional instructions   dextromethorphan-guaiFENesin 30-600 MG 12hr tablet Commonly known as:  MUCINEX DM Take 1 tablet by mouth 2 (two) times daily as needed for cough.   ezetimibe 10 MG tablet Commonly known as:  ZETIA Take 10 mg by mouth daily.   gabapentin 300 MG capsule Commonly known as:  NEURONTIN Take 1 cap in AM, 1 cap at noon, 2 caps at bedtime What changed:    how much to take  how to take this  when to take this  additional instructions   levothyroxine 100 MCG tablet Commonly known as:  SYNTHROID, LEVOTHROID Take 100 mcg by mouth daily.   linagliptin 5 MG Tabs tablet Commonly known as:  TRADJENTA Take 1 tablet (5 mg total) by mouth at bedtime.   meclizine 25 MG tablet Commonly known as:  ANTIVERT Take 25 mg by mouth at bedtime as needed for dizziness.   mupirocin ointment 2 % Commonly known as:  BACTROBAN Place 1 application into the nose 2 (two) times daily. For 3 days   ondansetron 4 MG tablet Commonly known as:  ZOFRAN Take 4 mg by mouth every 8 (eight) hours as needed for nausea or vomiting.   OXYGEN Inhale 2 L into the lungs continuous. As  directed.   polyethylene glycol packet Commonly known as:  MIRALAX / GLYCOLAX Take 17 g by mouth daily as needed for mild constipation. Reported on 10/09/2015   repaglinide 0.5 MG tablet Commonly known as:  PRANDIN Take 1 tablet (0.5 mg total) by mouth 3 (three) times daily before meals.   rivaroxaban 20 MG Tabs tablet Commonly known as:  XARELTO Take 1 tablet (20 mg total) by mouth daily with supper.   sertraline 25 MG tablet Commonly known as:  ZOLOFT Take 25 mg by mouth daily.   SYSTANE BALANCE 0.6 % Soln Generic drug:  Propylene Glycol Place 1 drop into both eyes 4 (four) times daily as needed (for dry eyes).       Allergies  Allergen Reactions  . Penicillins Anaphylaxis  . Codeine Other (See Comments)    unknown  . Sulfonamide Derivatives Other (See Comments)    unknown    Consultations:  Ophthalmology   Procedures/Studies: Dg Chest 2 View  Result Date: 09/17/2017 CLINICAL DATA:  Patient fell this  evening.  Pain. EXAM: CHEST  2 VIEW COMPARISON:  09/14/2017 FINDINGS: Stable cardiomegaly with aortic atherosclerosis. No pneumonic consolidation or effusion. No pneumothorax. No acute displaced appearing fracture. Thoracic spondylosis with osteopenia appears stable. Upper abdominal IVC filter is noted on the lateral view. IMPRESSION: Stable cardiomegaly with aortic atherosclerosis. No active pulmonary disease. No acute osseous appearing abnormality. Electronically Signed   By: Ashley Royalty M.D.   On: 09/17/2017 01:07   Dg Chest 2 View  Result Date: 09/14/2017 CLINICAL DATA:  Hypotension.  Near-syncope. EXAM: CHEST  2 VIEW COMPARISON:  Two-view chest x-ray 04/13/2017. FINDINGS: Heart size is normal. Mild pulmonary vascular congestion is present. Lung volumes are low. Mild bibasilar atelectasis is present. There is no significant consolidation. No effusions are present. Fused anterior osteophytes are present in the thoracic spine compatible with DISH IMPRESSION: 1. Low lung  volumes and mild pulmonary vascular congestion without failure. Electronically Signed   By: San Morelle M.D.   On: 09/14/2017 15:59   Ct Head Wo Contrast  Result Date: 09/17/2017 CLINICAL DATA:  82 year old female with head trauma. EXAM: CT HEAD WITHOUT CONTRAST CT CERVICAL SPINE WITHOUT CONTRAST TECHNIQUE: Multidetector CT imaging of the head and cervical spine was performed following the standard protocol without intravenous contrast. Multiplanar CT image reconstructions of the cervical spine were also generated. COMPARISON:  Head CT dated 09/14/2017 FINDINGS: CT HEAD FINDINGS Brain: There is age-related atrophy and chronic microvascular ischemic changes as seen previously. Probable small old bilateral basal ganglia lacunar infarct. There is no acute intracranial hemorrhage. No mass effect or midline shift. No extra-axial fluid collection. Vascular: No hyperdense vessel or unexpected calcification. Skull: Normal. Negative for fracture or focal lesion. Sinuses/Orbits: No acute finding. Other: Right forehead scalp hematoma. CT CERVICAL SPINE FINDINGS Alignment: No acute subluxation. Skull base and vertebrae: No acute fracture.  Osteopenia. Soft tissues and spinal canal: No prevertebral fluid or swelling. No visible canal hematoma. Disc levels: Multilevel degenerative changes. Multilevel anterior osteophytes primarily at C4-C7 likely represents diffuse idiopathic skeletal hyperostosis. Upper chest: Negative. Other: Bilateral carotid bulb atherosclerotic plaques, severe on the right. IMPRESSION: 1. No acute intracranial hemorrhage. 2. Age-related atrophy and chronic microvascular ischemic changes. 3. No acute/traumatic cervical spine pathology. Multilevel degenerative changes. 4. Bilateral carotid bulb calcified plaques, severe on the right. Electronically Signed   By: Anner Crete M.D.   On: 09/17/2017 00:37   Ct Head Wo Contrast  Result Date: 09/14/2017 CLINICAL DATA:  Altered level of  consciousness EXAM: CT HEAD WITHOUT CONTRAST TECHNIQUE: Contiguous axial images were obtained from the base of the skull through the vertex without intravenous contrast. COMPARISON:  03/18/2016 FINDINGS: Brain: No evidence of acute infarction, hemorrhage, hydrocephalus, extra-axial collection or mass lesion/mass effect. Subcortical white matter and periventricular small vessel ischemic changes. Vascular: No hyperdense vessel or unexpected calcification. Skull: Normal. Negative for fracture or focal lesion. Sinuses/Orbits: The visualized paranasal sinuses are essentially clear. The mastoid air cells are unopacified. Other: None. IMPRESSION: No evidence of acute intracranial abnormality. Small vessel ischemic changes. Electronically Signed   By: Julian Hy M.D.   On: 09/14/2017 16:03   Ct Cervical Spine Wo Contrast  Result Date: 09/17/2017 CLINICAL DATA:  82 year old female with head trauma. EXAM: CT HEAD WITHOUT CONTRAST CT CERVICAL SPINE WITHOUT CONTRAST TECHNIQUE: Multidetector CT imaging of the head and cervical spine was performed following the standard protocol without intravenous contrast. Multiplanar CT image reconstructions of the cervical spine were also generated. COMPARISON:  Head CT dated 09/14/2017 FINDINGS: CT HEAD FINDINGS Brain:  There is age-related atrophy and chronic microvascular ischemic changes as seen previously. Probable small old bilateral basal ganglia lacunar infarct. There is no acute intracranial hemorrhage. No mass effect or midline shift. No extra-axial fluid collection. Vascular: No hyperdense vessel or unexpected calcification. Skull: Normal. Negative for fracture or focal lesion. Sinuses/Orbits: No acute finding. Other: Right forehead scalp hematoma. CT CERVICAL SPINE FINDINGS Alignment: No acute subluxation. Skull base and vertebrae: No acute fracture.  Osteopenia. Soft tissues and spinal canal: No prevertebral fluid or swelling. No visible canal hematoma. Disc levels:  Multilevel degenerative changes. Multilevel anterior osteophytes primarily at C4-C7 likely represents diffuse idiopathic skeletal hyperostosis. Upper chest: Negative. Other: Bilateral carotid bulb atherosclerotic plaques, severe on the right. IMPRESSION: 1. No acute intracranial hemorrhage. 2. Age-related atrophy and chronic microvascular ischemic changes. 3. No acute/traumatic cervical spine pathology. Multilevel degenerative changes. 4. Bilateral carotid bulb calcified plaques, severe on the right. Electronically Signed   By: Anner Crete M.D.   On: 09/17/2017 00:37   Ct Orbits Wo Contrast  Result Date: 09/17/2017 CLINICAL DATA:  [place] yesterday striking the right face with orbital swelling. EXAM: CT ORBITS WITHOUT CONTRAST TECHNIQUE: Multidetector CT images were obtained using the standard protocol without intravenous contrast. COMPARISON:  09/16/2017 FINDINGS: Orbits: No change. Right periorbital superficial soft tissue swelling and forehead swelling consistent with post traumatic superficial hematoma. The globe is intact. Previous lens implants. Optic nerves are normal. Extra-ocular muscles are normal. Intraorbital fat is normal. No orbital emphysema. Visualized sinuses: Clear except for a few opacified ethmoid air cells consistent with mild seasonal change. Soft tissues: No other soft tissue finding of note. Limited intracranial: Normal IMPRESSION: Superficial right periorbital and forehead soft tissue swelling secondary to post traumatic hematoma. No evidence of facial fracture. No evidence of injury to the globe or other orbital contents. Electronically Signed   By: Nelson Chimes M.D.   On: 09/17/2017 09:32     Echocardiogram (09/17/2017) Study Conclusions  - Left ventricle: The cavity size was normal. There was moderate   concentric hypertrophy. Systolic function was normal. The   estimated ejection fraction was in the range of 60% to 65%. Wall   motion was normal; there were no regional  wall motion   abnormalities. The study was not technically sufficient to allow   evaluation of LV diastolic dysfunction due to atrial   fibrillation. - Aortic valve: Trileaflet; mildly thickened, mildly calcified   leaflets. - Mitral valve: Moderate focal calcification of the anterior   leaflet (medial segment(s)). - Tricuspid valve: There was trivial regurgitation. - Pulmonic valve: There was trivial regurgitation. - Pulmonary arteries: Systolic pressure could not be accurately   estimated.   Subjective: No pain. Can see better.  Discharge Exam: Vitals:   09/19/17 1949 09/20/17 0454  BP: 122/64 115/76  Pulse: 99 80  Resp: 18   Temp: 98.1 F (36.7 C) 98.2 F (36.8 C)  SpO2: 97% 98%   Vitals:   09/19/17 0448 09/19/17 1209 09/19/17 1949 09/20/17 0454  BP: 108/60 99/73 122/64 115/76  Pulse: 86 82 99 80  Resp: 18 18 18    Temp: 97.9 F (36.6 C) (!) 97.5 F (36.4 C) 98.1 F (36.7 C) 98.2 F (36.8 C)  TempSrc: Oral Oral Oral Oral  SpO2: 97% 92% 97% 98%  Weight: 98.4 kg (217 lb)   95.7 kg (211 lb)  Height:        General exam: Appears calm and comfortable HEENT: large area of ecchymossis around right orbit and right forehead. No  tenderness Respiratory system: Clear to auscultation. Respiratory effort normal. Cardiovascular system: S1 & S2 heard, Irregular rhythm with normal rate. No murmurs, rubs, gallops or clicks. Gastrointestinal system: Abdomen is nondistended, soft and nontender. No organomegaly or masses felt. Normal bowel sounds heard. Central nervous system: Alert and oriented. No focal neurological deficits. Extremities: No edema. No calf tenderness Skin: No cyanosis. No rashes Psychiatry: Judgement and insight appear normal. Mood & affect appropriate.    The results of significant diagnostics from this hospitalization (including imaging, microbiology, ancillary and laboratory) are listed below for reference.     Microbiology: Recent Results (from the past  240 hour(s))  Urine culture     Status: Abnormal   Collection Time: 09/14/17  5:23 PM  Result Value Ref Range Status   Specimen Description URINE, CLEAN CATCH  Final   Special Requests NONE  Final   Culture MULTIPLE SPECIES PRESENT, SUGGEST RECOLLECTION (A)  Final   Report Status 09/16/2017 FINAL  Final  MRSA PCR Screening     Status: Abnormal   Collection Time: 09/17/17  9:33 PM  Result Value Ref Range Status   MRSA by PCR POSITIVE (A) NEGATIVE Final    Comment:        The GeneXpert MRSA Assay (FDA approved for NASAL specimens only), is one component of a comprehensive MRSA colonization surveillance program. It is not intended to diagnose MRSA infection nor to guide or monitor treatment for MRSA infections. RESULT CALLED TO, READ BACK BY AND VERIFIED WITH: R. DELOATCH,RN 0246 09/18/2017 T. TYSOR      Labs: BNP (last 3 results) No results for input(s): BNP in the last 8760 hours. Basic Metabolic Panel: Recent Labs  Lab 09/14/17 1521 09/17/17 0040 09/17/17 0513 09/18/17 0336 09/20/17 0620  NA 140 139 141 139 138  K 3.8 4.4 4.1 4.0 4.2  CL 105 102 104 104 103  CO2 30 26 28 27 26   GLUCOSE 150* 149* 126* 130* 133*  BUN 26* 19 18 20  23*  CREATININE 1.53* 1.35* 1.35* 1.48* 1.35*  CALCIUM 8.0* 9.2 8.7* 8.3* 8.3*  MG  --   --  1.4* 1.6*  --    Liver Function Tests: Recent Labs  Lab 09/14/17 1521 09/17/17 0513  AST 19 18  ALT 12* 13*  ALKPHOS 77 79  BILITOT 0.2* 0.7  PROT 6.8 6.6  ALBUMIN 3.3* 3.3*   No results for input(s): LIPASE, AMYLASE in the last 168 hours. No results for input(s): AMMONIA in the last 168 hours. CBC: Recent Labs  Lab 09/14/17 1521 09/17/17 0040 09/17/17 0513  WBC 9.5 13.0* 10.9*  NEUTROABS 7.5 10.6* 9.2*  HGB 11.9* 14.0 12.5  HCT 39.2 44.9 41.1  MCV 96.6 95.5 94.3  PLT 204 216 218   Cardiac Enzymes: Recent Labs  Lab 09/17/17 0513 09/17/17 0714 09/17/17 1547 09/17/17 2118  CKTOTAL  --  44  --   --   TROPONINI <0.03  --   <0.03 <0.03   BNP: Invalid input(s): POCBNP CBG: Recent Labs  Lab 09/18/17 2128 09/19/17 0737 09/19/17 1113 09/19/17 2102 09/20/17 0740  GLUCAP 151* 119* 157* 133* 112*   D-Dimer No results for input(s): DDIMER in the last 72 hours. Hgb A1c No results for input(s): HGBA1C in the last 72 hours. Lipid Profile No results for input(s): CHOL, HDL, LDLCALC, TRIG, CHOLHDL, LDLDIRECT in the last 72 hours. Thyroid function studies No results for input(s): TSH, T4TOTAL, T3FREE, THYROIDAB in the last 72 hours.  Invalid input(s): FREET3 Anemia work up  No results for input(s): VITAMINB12, FOLATE, FERRITIN, TIBC, IRON, RETICCTPCT in the last 72 hours. Urinalysis    Component Value Date/Time   COLORURINE YELLOW 09/17/2017 0240   APPEARANCEUR CLEAR 09/17/2017 0240   LABSPEC 1.013 09/17/2017 0240   PHURINE 5.0 09/17/2017 0240   GLUCOSEU NEGATIVE 09/17/2017 0240   GLUCOSEU NEGATIVE 06/12/2014 1410   HGBUR MODERATE (A) 09/17/2017 0240   BILIRUBINUR NEGATIVE 09/17/2017 0240   BILIRUBINUR Neg 06/19/2016 1101   KETONESUR 5 (A) 09/17/2017 0240   PROTEINUR 30 (A) 09/17/2017 0240   UROBILINOGEN >=8.0 06/19/2016 1101   UROBILINOGEN 0.2 05/31/2015 1835   NITRITE NEGATIVE 09/17/2017 0240   LEUKOCYTESUR NEGATIVE 09/17/2017 0240   Sepsis Labs Invalid input(s): PROCALCITONIN,  WBC,  LACTICIDVEN Microbiology Recent Results (from the past 240 hour(s))  Urine culture     Status: Abnormal   Collection Time: 09/14/17  5:23 PM  Result Value Ref Range Status   Specimen Description URINE, CLEAN CATCH  Final   Special Requests NONE  Final   Culture MULTIPLE SPECIES PRESENT, SUGGEST RECOLLECTION (A)  Final   Report Status 09/16/2017 FINAL  Final  MRSA PCR Screening     Status: Abnormal   Collection Time: 09/17/17  9:33 PM  Result Value Ref Range Status   MRSA by PCR POSITIVE (A) NEGATIVE Final    Comment:        The GeneXpert MRSA Assay (FDA approved for NASAL specimens only), is one component  of a comprehensive MRSA colonization surveillance program. It is not intended to diagnose MRSA infection nor to guide or monitor treatment for MRSA infections. RESULT CALLED TO, READ BACK BY AND VERIFIED WITH: R. DELOATCH,RN 0246 09/18/2017 T. TYSOR      Time coordinating discharge: Over 30 minutes  SIGNED:   Cordelia Poche, MD [facility] 09/20/2017, 10:23 AM Pager [phone]  If 7PM-7AM, please contact night-coverage [email or web address] Password [email or web address]

## 2017-09-20 NOTE — Progress Notes (Addendum)
Clinical Social Worker facilitated patient discharge including contacting patient family and facility to confirm patient discharge plans.  Clinical information faxed to facility and family agreeable with plan.  CSW arranged ambulance transport via [facility] to [facility] .  RN to call [phone] and ask for [place] for report prior to discharge.  Clinical Social Worker will sign off for now as social work intervention is no longer needed. Please consult [place] again if new need arises.  Rhea Pink, MSW, [place]

## 2017-09-20 NOTE — Discharge Instructions (Signed)
Atrial Fibrillation Atrial fibrillation is a type of irregular or rapid heartbeat (arrhythmia). In atrial fibrillation, the heart quivers continuously in a chaotic pattern. This occurs when parts of the heart receive disorganized signals that make the heart unable to pump blood normally. This can increase the risk for stroke, heart failure, and other heart-related conditions. There are different types of atrial fibrillation, including:  Paroxysmal atrial fibrillation. This type starts suddenly, and it usually stops on its own shortly after it starts.  Persistent atrial fibrillation. This type often lasts longer than a week. It may stop on its own or with treatment.  Long-lasting persistent atrial fibrillation. This type lasts longer than 12 months.  Permanent atrial fibrillation. This type does not go away.  Talk with your health care provider to learn about the type of atrial fibrillation that you have. What are the causes? This condition is caused by some heart-related conditions or procedures, including:  A heart attack.  Coronary artery disease.  Heart failure.  Heart valve conditions.  High blood pressure.  Inflammation of the sac that surrounds the heart (pericarditis).  Heart surgery.  Certain heart rhythm disorders, such as Wolf-Parkinson-White syndrome.  Other causes include:  Pneumonia.  Obstructive sleep apnea.  Blockage of an artery in the lungs (pulmonary embolism, or PE).  Lung cancer.  Chronic lung disease.  Thyroid problems, especially if the thyroid is overactive (hyperthyroidism).  Caffeine.  Excessive alcohol use or illegal drug use.  Use of some medicines, including certain decongestants and diet pills.  Sometimes, the cause cannot be found. What increases the risk? This condition is more likely to develop in:  People who are older in age.  People who smoke.  People who have diabetes mellitus.  People who are overweight  (obese).  Athletes who exercise vigorously.  What are the signs or symptoms? Symptoms of this condition include:  A feeling that your heart is beating rapidly or irregularly.  A feeling of discomfort or pain in your chest.  Shortness of breath.  Sudden light-headedness or weakness.  Getting tired easily during exercise.  In some cases, there are no symptoms. How is this diagnosed? Your health care provider may be able to detect atrial fibrillation when taking your pulse. If detected, this condition may be diagnosed with:  An electrocardiogram (ECG).  A Holter monitor test that records your heartbeat patterns over a 24-hour period.  Transthoracic echocardiogram (TTE) to evaluate how blood flows through your heart.  Transesophageal echocardiogram (TEE) to view more detailed images of your heart.  A stress test.  Imaging tests, such as a CT scan or chest X-ray.  Blood tests.  How is this treated? The main goals of treatment are to prevent blood clots from forming and to keep your heart beating at a normal rate and rhythm. The type of treatment that you receive depends on many factors, such as your underlying medical conditions and how you feel when you are experiencing atrial fibrillation. This condition may be treated with:  Medicine to slow down the heart rate, bring the heart's rhythm back to normal, or prevent clots from forming.  Electrical cardioversion. This is a procedure that resets your heart's rhythm by delivering a controlled, low-energy shock to the heart through your skin.  Different types of ablation, such as catheter ablation, catheter ablation with pacemaker, or surgical ablation. These procedures destroy the heart tissues that send abnormal signals. When the pacemaker is used, it is placed under your skin to help your heart beat in   a regular rhythm.  Follow these instructions at home:  Take over-the counter and prescription medicines only as told by your  health care provider.  If your health care provider prescribed a blood-thinning medicine (anticoagulant), take it exactly as told. Taking too much blood-thinning medicine can cause bleeding. If you do not take enough blood-thinning medicine, you will not have the protection that you need against stroke and other problems.  Do not use tobacco products, including cigarettes, chewing tobacco, and e-cigarettes. If you need help quitting, ask your health care provider.  If you have obstructive sleep apnea, manage your condition as told by your health care provider.  Do not drink alcohol.  Do not drink beverages that contain caffeine, such as coffee, soda, and tea.  Maintain a healthy weight. Do not use diet pills unless your health care provider approves. Diet pills may make heart problems worse.  Follow diet instructions as told by your health care provider.  Exercise regularly as told by your health care provider.  Keep all follow-up visits as told by your health care provider. This is important. How is this prevented?  Avoid drinking beverages that contain caffeine or alcohol.  Avoid certain medicines, especially medicines that are used for breathing problems.  Avoid certain herbs and herbal medicines, such as those that contain ephedra or ginseng.  Do not use illegal drugs, such as cocaine and amphetamines.  Do not smoke.  Manage your high blood pressure. Contact a health care provider if:  You notice a change in the rate, rhythm, or strength of your heartbeat.  You are taking an anticoagulant and you notice increased bruising.  You tire more easily when you exercise or exert yourself. Get help right away if:  You have chest pain, abdominal pain, sweating, or weakness.  You feel nauseous.  You notice blood in your vomit, bowel movement, or urine.  You have shortness of breath.  You suddenly have swollen feet and ankles.  You feel dizzy.  You have sudden weakness or  numbness of the face, arm, or leg, especially on one side of the body.  You have trouble speaking, trouble understanding, or both (aphasia).  Your face or your eyelid droops on one side. These symptoms may represent a serious problem that is an emergency. Do not wait to see if the symptoms will go away. Get medical help right away. Call your local emergency services (911 in the [place] Do not drive yourself to the hospital. This information is not intended to replace advice given to you by your health care provider. Make sure you discuss any questions you have with your health care provider. Document Released: 08/24/2005 Document Revised: 01/01/2016 Document Reviewed: 12/19/2014 Elsevier Interactive Patient Education  2018 [employer]  

## 2017-09-20 NOTE — Plan of Care (Signed)
  Progressing Pain Managment: General experience of comfort will improve 09/20/2017 0317 - Progressing by Ardine Eng, RN

## 2017-09-20 NOTE — Progress Notes (Signed)
Pt discharged to [facility] by PTAR at 10.40pm in a stable condition. Night medicines provided to the patient before she left. AVS printed out and given to PTAR. Receiving nurses are aware. Pt's tele and IV was already taken out on previous shift. Pt left in a stretcher with all of her belongings.  Palma Holter, RN

## 2017-09-21 ENCOUNTER — Telehealth: Payer: Self-pay

## 2017-09-21 NOTE — Telephone Encounter (Signed)
Caled to complete [facility] follow up . Patient is in [facility].

## 2017-10-01 ENCOUNTER — Ambulatory Visit: Payer: Medicare Other | Admitting: Internal Medicine

## 2017-10-01 ENCOUNTER — Encounter: Payer: Self-pay | Admitting: Internal Medicine

## 2017-10-01 DIAGNOSIS — Z0289 Encounter for other administrative examinations: Secondary | ICD-10-CM

## 2017-12-14 ENCOUNTER — Telehealth: Payer: Self-pay | Admitting: Internal Medicine

## 2017-12-14 ENCOUNTER — Encounter: Payer: Self-pay | Admitting: Internal Medicine

## 2017-12-14 NOTE — Telephone Encounter (Signed)
Prolia benefits received PA not required Medicare/Medicaid secondary should cover  *Patients prolia should be called in to her local pharmacy for Medicaid benefit to cover*   Patient may owe approximately $0 OOP  Patient due after 12/14/17  Letter mailed to inform patient of benefits and to schedule

## 2018-02-11 NOTE — Telephone Encounter (Signed)
Author phoned pt. to check on status of prolia. Author spoke with [name], niece, and niece stated that her aunt is in [facility], and niece will ask her the next time she visits if pt. still wants the prolia.

## 2018-05-23 LAB — HM DIABETES EYE EXAM

## 2018-05-25 ENCOUNTER — Encounter: Payer: Self-pay | Admitting: Internal Medicine

## 2019-10-01 ENCOUNTER — Encounter (HOSPITAL_COMMUNITY): Payer: Self-pay | Admitting: Emergency Medicine

## 2019-10-01 ENCOUNTER — Emergency Department (HOSPITAL_COMMUNITY): Payer: Medicare Other

## 2019-10-01 ENCOUNTER — Inpatient Hospital Stay (HOSPITAL_COMMUNITY)
Admission: EM | Admit: 2019-10-01 | Discharge: 2019-10-03 | DRG: 177 | Disposition: A | Discharge status: Home / Self Care | Payer: Medicare Other | Source: Skilled Nursing Facility | Attending: Internal Medicine | Admitting: Internal Medicine

## 2019-10-01 ENCOUNTER — Other Ambulatory Visit: Payer: Self-pay

## 2019-10-01 DIAGNOSIS — Z87891 Personal history of nicotine dependence: Secondary | ICD-10-CM

## 2019-10-01 DIAGNOSIS — J440 Chronic obstructive pulmonary disease with acute lower respiratory infection: Secondary | ICD-10-CM | POA: Diagnosis present

## 2019-10-01 DIAGNOSIS — Z882 Allergy status to sulfonamides status: Secondary | ICD-10-CM

## 2019-10-01 DIAGNOSIS — Z833 Family history of diabetes mellitus: Secondary | ICD-10-CM

## 2019-10-01 DIAGNOSIS — Z9851 Tubal ligation status: Secondary | ICD-10-CM

## 2019-10-01 DIAGNOSIS — N390 Urinary tract infection, site not specified: Secondary | ICD-10-CM

## 2019-10-01 DIAGNOSIS — J208 Acute bronchitis due to other specified organisms: Secondary | ICD-10-CM | POA: Diagnosis present

## 2019-10-01 DIAGNOSIS — Z79899 Other long term (current) drug therapy: Secondary | ICD-10-CM

## 2019-10-01 DIAGNOSIS — N179 Acute kidney failure, unspecified: Secondary | ICD-10-CM | POA: Diagnosis not present

## 2019-10-01 DIAGNOSIS — Z885 Allergy status to narcotic agent status: Secondary | ICD-10-CM

## 2019-10-01 DIAGNOSIS — F329 Major depressive disorder, single episode, unspecified: Secondary | ICD-10-CM | POA: Diagnosis present

## 2019-10-01 DIAGNOSIS — R197 Diarrhea, unspecified: Secondary | ICD-10-CM

## 2019-10-01 DIAGNOSIS — A0839 Other viral enteritis: Secondary | ICD-10-CM

## 2019-10-01 DIAGNOSIS — Z880 Allergy status to penicillin: Secondary | ICD-10-CM

## 2019-10-01 DIAGNOSIS — Z86718 Personal history of other venous thrombosis and embolism: Secondary | ICD-10-CM

## 2019-10-01 DIAGNOSIS — Z7901 Long term (current) use of anticoagulants: Secondary | ICD-10-CM

## 2019-10-01 DIAGNOSIS — U071 COVID-19: Secondary | ICD-10-CM | POA: Diagnosis not present

## 2019-10-01 DIAGNOSIS — I129 Hypertensive chronic kidney disease with stage 1 through stage 4 chronic kidney disease, or unspecified chronic kidney disease: Secondary | ICD-10-CM | POA: Diagnosis present

## 2019-10-01 DIAGNOSIS — E119 Type 2 diabetes mellitus without complications: Secondary | ICD-10-CM

## 2019-10-01 DIAGNOSIS — Z7984 Long term (current) use of oral hypoglycemic drugs: Secondary | ICD-10-CM

## 2019-10-01 DIAGNOSIS — Z8673 Personal history of transient ischemic attack (TIA), and cerebral infarction without residual deficits: Secondary | ICD-10-CM

## 2019-10-01 DIAGNOSIS — J449 Chronic obstructive pulmonary disease, unspecified: Secondary | ICD-10-CM | POA: Diagnosis present

## 2019-10-01 DIAGNOSIS — Z9089 Acquired absence of other organs: Secondary | ICD-10-CM

## 2019-10-01 DIAGNOSIS — Z86711 Personal history of pulmonary embolism: Secondary | ICD-10-CM

## 2019-10-01 DIAGNOSIS — Z9849 Cataract extraction status, unspecified eye: Secondary | ICD-10-CM

## 2019-10-01 DIAGNOSIS — E1122 Type 2 diabetes mellitus with diabetic chronic kidney disease: Secondary | ICD-10-CM | POA: Diagnosis present

## 2019-10-01 DIAGNOSIS — N1831 Chronic kidney disease, stage 3a: Secondary | ICD-10-CM | POA: Diagnosis present

## 2019-10-01 DIAGNOSIS — Z981 Arthrodesis status: Secondary | ICD-10-CM

## 2019-10-01 DIAGNOSIS — E039 Hypothyroidism, unspecified: Secondary | ICD-10-CM | POA: Diagnosis present

## 2019-10-01 DIAGNOSIS — E785 Hyperlipidemia, unspecified: Secondary | ICD-10-CM | POA: Diagnosis present

## 2019-10-01 DIAGNOSIS — Z79890 Hormone replacement therapy (postmenopausal): Secondary | ICD-10-CM

## 2019-10-01 DIAGNOSIS — Z993 Dependence on wheelchair: Secondary | ICD-10-CM

## 2019-10-01 DIAGNOSIS — Z9049 Acquired absence of other specified parts of digestive tract: Secondary | ICD-10-CM

## 2019-10-01 DIAGNOSIS — E669 Obesity, unspecified: Secondary | ICD-10-CM | POA: Diagnosis present

## 2019-10-01 DIAGNOSIS — H9190 Unspecified hearing loss, unspecified ear: Secondary | ICD-10-CM | POA: Diagnosis present

## 2019-10-01 DIAGNOSIS — E860 Dehydration: Secondary | ICD-10-CM

## 2019-10-01 DIAGNOSIS — F419 Anxiety disorder, unspecified: Secondary | ICD-10-CM | POA: Diagnosis present

## 2019-10-01 DIAGNOSIS — Z6835 Body mass index (BMI) 35.0-35.9, adult: Secondary | ICD-10-CM

## 2019-10-01 DIAGNOSIS — J9621 Acute and chronic respiratory failure with hypoxia: Secondary | ICD-10-CM | POA: Diagnosis present

## 2019-10-01 DIAGNOSIS — N183 Chronic kidney disease, stage 3 unspecified: Secondary | ICD-10-CM | POA: Diagnosis present

## 2019-10-01 DIAGNOSIS — J181 Lobar pneumonia, unspecified organism: Secondary | ICD-10-CM | POA: Diagnosis present

## 2019-10-01 DIAGNOSIS — Z9981 Dependence on supplemental oxygen: Secondary | ICD-10-CM

## 2019-10-01 DIAGNOSIS — M810 Age-related osteoporosis without current pathological fracture: Secondary | ICD-10-CM | POA: Diagnosis present

## 2019-10-01 LAB — CBC WITH DIFFERENTIAL/PLATELET
Abs Immature Granulocytes: 0.02 10*3/uL (ref 0.00–0.07)
Basophils Absolute: 0 10*3/uL (ref 0.0–0.1)
Basophils Relative: 0 %
Eosinophils Absolute: 0 10*3/uL (ref 0.0–0.5)
Eosinophils Relative: 0 %
HCT: 44.9 % (ref 36.0–46.0)
Hemoglobin: 14.2 g/dL (ref 12.0–15.0)
Immature Granulocytes: 0 %
Lymphocytes Relative: 11 %
Lymphs Abs: 0.7 10*3/uL (ref 0.7–4.0)
MCH: 29.5 pg (ref 26.0–34.0)
MCHC: 31.6 g/dL (ref 30.0–36.0)
MCV: 93.3 fL (ref 80.0–100.0)
Monocytes Absolute: 0.5 10*3/uL (ref 0.1–1.0)
Monocytes Relative: 7 %
Neutro Abs: 5.5 10*3/uL (ref 1.7–7.7)
Neutrophils Relative %: 82 %
Platelets: 190 10*3/uL (ref 150–400)
RBC: 4.81 MIL/uL (ref 3.87–5.11)
RDW: 15.8 % — ABNORMAL HIGH (ref 11.5–15.5)
WBC: 6.8 10*3/uL (ref 4.0–10.5)
nRBC: 0 % (ref 0.0–0.2)

## 2019-10-01 LAB — COMPREHENSIVE METABOLIC PANEL
ALT: 20 U/L (ref 0–44)
AST: 29 U/L (ref 15–41)
Albumin: 3.5 g/dL (ref 3.5–5.0)
Alkaline Phosphatase: 78 U/L (ref 38–126)
Anion gap: 13 (ref 5–15)
BUN: 41 mg/dL — ABNORMAL HIGH (ref 8–23)
CO2: 27 mmol/L (ref 22–32)
Calcium: 8.5 mg/dL — ABNORMAL LOW (ref 8.9–10.3)
Chloride: 98 mmol/L (ref 98–111)
Creatinine, Ser: 2.54 mg/dL — ABNORMAL HIGH (ref 0.44–1.00)
GFR calc Af Amer: 20 mL/min — ABNORMAL LOW (ref >60)
GFR calc non Af Amer: 17 mL/min — ABNORMAL LOW (ref >60)
Glucose, Bld: 90 mg/dL (ref 70–99)
Potassium: 3.9 mmol/L (ref 3.5–5.1)
Sodium: 138 mmol/L (ref 135–145)
Total Bilirubin: 0.9 mg/dL (ref 0.3–1.2)
Total Protein: 7.2 g/dL (ref 6.5–8.1)

## 2019-10-01 LAB — URINALYSIS, ROUTINE W REFLEX MICROSCOPIC
Bilirubin Urine: NEGATIVE
Glucose, UA: NEGATIVE mg/dL
Ketones, ur: 5 mg/dL — AB
Nitrite: NEGATIVE
Protein, ur: 30 mg/dL — AB
Specific Gravity, Urine: 1.023 (ref 1.005–1.030)
WBC, UA: >50 WBC/hpf — ABNORMAL HIGH (ref 0–5)
pH: 5 (ref 5.0–8.0)

## 2019-10-01 LAB — FIBRINOGEN: Fibrinogen: 771 mg/dL — ABNORMAL HIGH (ref 210–475)

## 2019-10-01 LAB — C-REACTIVE PROTEIN: CRP: 13.6 mg/dL — ABNORMAL HIGH (ref <1.0)

## 2019-10-01 LAB — D-DIMER, QUANTITATIVE: D-Dimer, Quant: 0.98 ug/mL-FEU — ABNORMAL HIGH (ref 0.00–0.50)

## 2019-10-01 LAB — LACTATE DEHYDROGENASE: LDH: 227 U/L — ABNORMAL HIGH (ref 98–192)

## 2019-10-01 LAB — FERRITIN: Ferritin: 137 ng/mL (ref 11–307)

## 2019-10-01 LAB — PROCALCITONIN: Procalcitonin: 0.16 ng/mL

## 2019-10-01 LAB — LACTIC ACID, PLASMA: Lactic Acid, Venous: 1.3 mmol/L (ref 0.5–1.9)

## 2019-10-01 LAB — TRIGLYCERIDES: Triglycerides: 116 mg/dL (ref <150)

## 2019-10-01 MED ORDER — ALBUTEROL SULFATE HFA 108 (90 BASE) MCG/ACT IN AERS
4.0000 | INHALATION_SPRAY | Freq: Once | RESPIRATORY_TRACT | Status: AC
  Administered 2019-10-01: 19:00:00 4 via RESPIRATORY_TRACT
  Filled 2019-10-01: qty 6.7

## 2019-10-01 MED ORDER — GUAIFENESIN ER 600 MG PO TB12
600.0000 mg | ORAL_TABLET | Freq: Two times a day (BID) | ORAL | Status: DC
  Administered 2019-10-01 – 2019-10-03 (×4): 600 mg via ORAL
  Filled 2019-10-01 (×4): qty 1

## 2019-10-01 MED ORDER — SODIUM CHLORIDE 0.9 % IV BOLUS
1000.0000 mL | Freq: Once | INTRAVENOUS | Status: AC
  Administered 2019-10-01: 21:00:00 1000 mL via INTRAVENOUS

## 2019-10-01 NOTE — H&P (Addendum)
History and Physical    Dawn Morrow [id number] DOB: [birth year] DOA: 10/01/2019  PCP: Colon Branch, MD   Patient coming from: Skilled nursing facility  Chief Complaint: Patient was BIBEMS on account of cough and altered mental status  HPI: Dawn Morrow is a 84 y.o. 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 Patient was brought to the ER from nursing home on account of generalized weakness.  Patient apparently tested positive for Covid on 09/08/2019.  She was conservatively treated as outpatient.  She complains of persistent cough which productive of whitish to yellowish sputum.  Denies any fever or chills.  Denies any shortness of breath from her baseline.  She denies any chest pain or palpitation.  Denies any nausea or vomiting.  She complaint of having diarrhea over the past several days and now associated with generalized weakness.  She has had poor oral intake over the past several days as well.  Patient was referred to hospitalist service for admission and further management hence this admission.  Note that patient is a poor historian due to history of hard of hearing and current condition.  ED Course:In the ER, patient was on 2 L/min of oxygen.  She was treated with IV fluids and referred to hospitalist service.  Chest x-ray significant for questionable left lung infiltrate.  Repeat Covid test pending.   Review of Systems: As per HPI otherwise 10 point review of systems negative.    Past Medical History:  Diagnosis Date  . Anxiety and depression   . Asthma   . Cataract   . COPD (chronic obstructive pulmonary disease) ([place])   . Diabetes mellitus   . Diverticulosis   . DVT (deep vein thrombosis) in pregnancy    hx x multiple on coumadin  . GERD (gastroesophageal reflux disease)   . Hemorrhoids   . Hiatal hernia   . Hyperlipidemia   .  Hypertension   . Hypothyroidism   . On home oxygen therapy    "2L; 24/7" (09/17/2017)  . Osteoarthritis    h/o spinal stenosis by MRI 2009  . Osteopenia   . Pulmonary embolism (HCC)    x 2  . Stroke [facility])    MINI  . Tubular adenoma of colon 07/2005    Past Surgical History:  Procedure Laterality Date  . APPENDECTOMY    . CATARACT EXTRACTION    . CHOLECYSTECTOMY    . LUMBAR FUSION    . POLYPECTOMY    . TONSILLECTOMY    . TUBAL LIGATION       reports that she has quit smoking. Her smoking use included cigarettes. She has never used smokeless tobacco. She reports that she does not drink alcohol or use drugs.  Allergies  Allergen Reactions  . Penicillins Anaphylaxis  . Codeine Other (See Comments)    unknown  . Sulfonamide Derivatives Other (See Comments)    unknown    Family History  Adopted: Yes  Problem Relation Age of Onset  . Cancer Mother        ? colon or ovarian  . Diabetes Mother   . Cancer Brother        ?  . CAD Neg Hx     Prior to Admission medications   Medication Sig Start Date End Date Taking? Authorizing Provider  acetaminophen (TYLENOL) 500 MG tablet Take 500 mg by mouth every 8 (eight) hours as needed for  mild pain.     [provider]  albuterol (PROVENTIL HFA;VENTOLIN HFA) 108 (90 BASE) MCG/ACT inhaler Inhale 2 puffs into the lungs every 6 (six) hours as needed for wheezing or shortness of breath. 08/05/15   Saguier, Percell Miller, PA-C  aspirin-acetaminophen-caffeine (EXCEDRIN MIGRAINE) [phone] MG per tablet Take 2 tablets by mouth every 6 (six) hours as needed for headache (headache). Reported on 10/09/2015    [provider]  atorvastatin (LIPITOR) 10 MG tablet Take 1 tablet (10 mg total) by mouth at bedtime. 10/21/15   Colon Branch, MD  buPROPion (WELLBUTRIN XL) 150 MG 24 hr tablet Take 150 mg by mouth daily. Take with wellbutrin xl 300 mg to equal 450 mg    [provider]  buPROPion (WELLBUTRIN XL) 300 MG 24 hr tablet Take  300 mg by mouth daily. Take with wellbutrin xl 150 mg to equal 450 mg    [provider]  carvedilol (COREG) 3.125 MG tablet Take 1 tablet (3.125 mg total) by mouth 2 (two) times daily with a meal. 09/20/17   Mariel Aloe, MD  dextromethorphan-guaiFENesin (MUCINEX DM) 30-600 MG 12hr tablet Take 1 tablet by mouth 2 (two) times daily as needed for cough.    [provider]  ezetimibe (ZETIA) 10 MG tablet Take 10 mg by mouth daily.    [provider]  gabapentin (NEURONTIN) 300 MG capsule Take 1 cap in AM, 1 cap at noon, 2 caps at bedtime Patient taking differently: Take 300-600 mg by mouth 3 (three) times daily. Take 1 capsule every morning and afternoon then take 2 capsules at bedtime. 12/21/14   Cameron Sprang, MD  levothyroxine (SYNTHROID, LEVOTHROID) 100 MCG tablet Take 100 mcg by mouth daily.    [provider]  linagliptin (TRADJENTA) 5 MG TABS tablet Take 1 tablet (5 mg total) by mouth at bedtime. 07/15/16   Colon Branch, MD  meclizine (ANTIVERT) 25 MG tablet Take 25 mg by mouth at bedtime as needed for dizziness.    [provider]  mupirocin ointment (BACTROBAN) 2 % Place 1 application into the nose 2 (two) times daily. For 3 days 09/20/17   Mariel Aloe, MD  ondansetron (ZOFRAN) 4 MG tablet Take 4 mg by mouth every 8 (eight) hours as needed for nausea or vomiting.    [provider]  OXYGEN Inhale 2 L into the lungs continuous. As directed.     [provider]  polyethylene glycol (MIRALAX / GLYCOLAX) packet Take 17 g by mouth daily as needed for mild constipation. Reported on 10/09/2015    [provider]  Propylene Glycol (SYSTANE BALANCE) 0.6 % SOLN Place 1 drop into both eyes 4 (four) times daily as needed (for dry eyes).     [provider]  repaglinide (PRANDIN) 0.5 MG tablet Take 1 tablet (0.5 mg total) by mouth 3 (three) times daily before meals. 01/14/16   Colon Branch, MD  rivaroxaban (XARELTO) 20 MG TABS  tablet Take 1 tablet (20 mg total) by mouth daily with supper. 09/14/14   Colon Branch, MD  sertraline (ZOLOFT) 25 MG tablet Take 25 mg by mouth daily.    [provider]    Physical Exam: Vitals:   10/01/19 1900 10/01/19 1915 10/01/19 2030 10/01/19 2100  BP: 123/75  140/67 (!) 159/92  Pulse: 76  76 83  Resp: 19  16 17   Temp:  98.3 F (36.8 C)    TempSrc:  Axillary  SpO2: 94%  97% 95%    Constitutional: Obese, NAD, calm, comfortable. She is hard of hearing. Vitals:   10/01/19 1900 10/01/19 1915 10/01/19 2030 10/01/19 2100  BP: 123/75  140/67 (!) 159/92  Pulse: 76  76 83  Resp: 19  16 17   Temp:  98.3 F (36.8 C)    TempSrc:  Axillary    SpO2: 94%  97% 95%   Eyes: PERRL, lids and conjunctivae normal ENMT: Mucous membranes are moist. Posterior pharynx clear of any exudate or lesions.Normal dentition.  Neck: normal, supple, no masses, no thyromegaly Respiratory: Diminished breath sounds due to body habitus. Cardiovascular: Regular rate and rhythm, no murmurs / rubs / gallops.extremity edema. 2+ pedal pulses. No carotid bruits.  Abdomen: no tenderness, no masses palpated. No hepatosplenomegaly. Bowel sounds positive.  Musculoskeletal: no clubbing / cyanosis. No joint deformity upper and lower extremities. Good ROM, no contractures. Normal muscle tone.  Skin: no rashes, lesions, ulcers. No induration Neurologic: CN 2-12 grossly intact. Sensation intact, DTR normal. Strength 5/5 in all 4.  Psychiatric: Poor judgment and insight. Normal mood.   Labs on Admission: I have personally reviewed following labs and imaging studies  CBC: Recent Labs  Lab 10/01/19 1851  WBC 6.8  NEUTROABS 5.5  HGB 14.2  HCT 44.9  MCV 93.3  PLT [id number]   Basic Metabolic Panel: Recent Labs  Lab 10/01/19 1851  NA 138  K 3.9  CL 98  CO2 27  GLUCOSE 90  BUN 41*  CREATININE 2.54*  CALCIUM 8.5*   GFR: CrCl cannot be calculated (Unknown ideal weight.). Liver Function Tests: Recent Labs    Lab 10/01/19 1851  AST 29  ALT 20  ALKPHOS 78  BILITOT 0.9  PROT 7.2  ALBUMIN 3.5   No results for input(s): LIPASE, AMYLASE in the last 168 hours. No results for input(s): AMMONIA in the last 168 hours. Coagulation Profile: No results for input(s): INR, PROTIME in the last 168 hours. Cardiac Enzymes: No results for input(s): CKTOTAL, CKMB, CKMBINDEX, TROPONINI in the last 168 hours. BNP (last 3 results) No results for input(s): PROBNP in the last 8760 hours. HbA1C: No results for input(s): HGBA1C in the last 72 hours. CBG: No results for input(s): GLUCAP in the last 168 hours. Lipid Profile: Recent Labs    10/01/19 1851  TRIG 116   Thyroid Function Tests: No results for input(s): TSH, T4TOTAL, FREET4, T3FREE, THYROIDAB in the last 72 hours. Anemia Panel: Recent Labs    10/01/19 1851  FERRITIN 137   Urine analysis:   Radiological Exams on Admission: DG Chest Port 1 View  Result Date: 10/01/2019 CLINICAL DATA:  Tested COVID-19 positive on 09/21/2019, altered mental status, diarrhea, lethargy, hypoxemia, history hypertension, stroke, asthma, COPD, pulmonary embolism, former smoker, diabetes mellitus EXAM: PORTABLE CHEST 1 VIEW COMPARISON:  Portable exam 1809 hours compared to 09/17/2017 FINDINGS: Upper normal heart size. Mediastinal contours and pulmonary vascularity normal. Atherosclerotic calcification aorta. Question minimal infiltrate RIGHT mid lung. Remaining lungs clear. No pleural effusion or pneumothorax. Bones demineralized. IMPRESSION: Question minimal infiltrate lateral RIGHT mid lung. Electronically Signed   By: Lavonia Dana M.D.   On: 10/01/2019 18:53    EKG: Independently reviewed.  Sinus rhythm with no acute ST wave changes suggestive of ischemia. Assessment/Plan Active Problems:   Acute kidney failure ([place])   #1.  Acute kidney injury on CKD stage 3A ,likely prerenal in etiology secondary to diarrhea/dehydration in the context of recent COVID-19  infection.  Patient will be adequately volume  repleted with IV fluids.  Will monitor input and output.  Avoid nephrotoxic medication.  Follow-up Chem-7 daily.  #2.  No history of COVID-19 infection.  Tested positive on 01/1 4.  No obvious respiratory dysfunction at this time.  Questionable left lung infiltrate.  Will cover with IV antibiotics.  Considering greater than 10 days exposure, will defer on remdesivir at this time.  Monitor on current treatment.  #3.  Acute bronchitis, sequelae from recent COVID-19 infection.  Will consider patient on prednisone and empiric antibiotics. Procalcitonin wnl  #4. ? Left lower lobe pneumonia: Present on admission.  Suspected bacterial superinfection  #5. Acute UTI- Present on admission.Empiric antibiotics will be initiated . Tailor antibiotics according to C&Sensitivity report.  #6. History of COPD with chronic respiratory failure.  On 2 L/min of oxygen daily.  We will continue with same.  Remained stable and compensated at this time.  #7. History of PE and DVT. Patient already on Xarelto as an outpatient  #8. Diabetes mellitus, type 2-Continue Tradjenta.  Fingerstick glucose monitoring before every meal plus at bedtime.  ADA diet advised.  Continue SSI  #9. Hypothyroidism-Continue Synthroid  #10. Hearing impairment -chronic  #11. Diarrhea- improved prior to admission'  DVT prophylaxis: Xarelto Code Status: FULL  Family Communication:  Disposition Plan: SNF Consults called: None Admission status: Observation  Artist Beach MD [facility] Pager [phone]  If 7PM-7AM, please contact night-coverage [email or web address] Password [email or web address]  10/01/2019, 10:41 PM

## 2019-10-01 NOTE — ED Provider Notes (Signed)
[facility] DEPT Provider Note   CSN: [id number] Arrival date & time: 10/01/19  1653     History Chief Complaint  Patient presents with  . Diarrhea  . Fatigue    Dawn Morrow is a 84 y.o. female.  The history is provided by the patient.  Weakness Severity:  Moderate Onset quality:  Gradual Timing:  Constant Progression:  Worsening Chronicity:  New Context: recent infection (COVID 1/14, still with cough and now diarrhea)   Relieved by:  Nothing Worsened by:  Nothing Associated symptoms: cough, diarrhea and nausea   Associated symptoms: no abdominal pain, no arthralgias, no chest pain, no dysuria, no fever, no seizures, no shortness of breath and no vomiting   Risk factors comment:  COPD on 2L of O2      Past Medical History:  Diagnosis Date  . Anxiety and depression   . Asthma   . Cataract   . COPD (chronic obstructive pulmonary disease) ([place])   . Diabetes mellitus   . Diverticulosis   . DVT (deep vein thrombosis) in pregnancy    hx x multiple on coumadin  . GERD (gastroesophageal reflux disease)   . Hemorrhoids   . Hiatal hernia   . Hyperlipidemia   . Hypertension   . Hypothyroidism   . On home oxygen therapy    "2L; 24/7" (09/17/2017)  . Osteoarthritis    h/o spinal stenosis by MRI 2009  . Osteopenia   . Pulmonary embolism (HCC)    x 2  . Stroke [facility])    MINI  . Tubular adenoma of colon 07/2005    Patient Active Problem List   Diagnosis Date Noted  . Syncope 09/18/2017  . Near syncope 09/17/2017  . Diabetes mellitus type 2 in obese ([place]) 09/17/2017  . Atrial fibrillation with RVR ([place]) 09/17/2017  . Hypomagnesemia 09/17/2017  . CKD (chronic kidney disease), stage III 09/17/2017  . Forehead contusion   . MGUS (monoclonal gammopathy of unknown significance) 08/13/2016  . Renal failure 07/09/2015  . Follow-up ---------PCP NOTES 05/17/2015  . Essential tremor 12/28/2014  . Ulnar neuropathy of left upper extremity  12/28/2014  . Intractable pain 06/21/2014  . Back pain 06/21/2014  . Lumbar radiculopathy 06/21/2014  . Encounter for therapeutic drug monitoring 03/14/2014  . Numbness 10/18/2013  . TIA (transient ischemic attack) 09/19/2013  . Weakness 09/18/2013  . Annual physical exam 05/30/2012  . Tremor 01/21/2012  . Failure to thrive and poor med compliance  01/21/2012  . PE (pulmonary embolism) 11/18/2010  . DVT (deep venous thrombosis) ([place]) 11/18/2010  . Long term current use of anticoagulant 11/18/2010  . ABNORMAL ELECTROCARDIOGRAM 11/10/2010  . OSTEOARTHRITIS 08/06/2010  . DIZZINESS 04/15/2010  . Diabetes ([place]) 06/04/2009  . ACNE ROSACEA 11/28/2008  . BACK PAIN 05/16/2008  . HIP PAIN, RIGHT, CHRONIC 09/15/2007  . Hypothyroidism 09/13/2007  . Dyslipidemia 09/13/2007  . Osteoporosis 07/22/2007  . DEPRESSION 09/11/2006  . HTN (hypertension) 09/11/2006  . ASTHMA 09/11/2006  . COPD (chronic obstructive pulmonary disease) ([place]) 09/11/2006  . GERD 09/11/2006  . Recurrent UTI 09/11/2006  . GREENFIELD FILTER INSERTION, HX OF 09/11/2006    Past Surgical History:  Procedure Laterality Date  . APPENDECTOMY    . CATARACT EXTRACTION    . CHOLECYSTECTOMY    . LUMBAR FUSION    . POLYPECTOMY    . TONSILLECTOMY    . TUBAL LIGATION       OB History   No obstetric history on file.  Family History  Adopted: Yes  Problem Relation Age of Onset  . Cancer Mother        ? colon or ovarian  . Diabetes Mother   . Cancer Brother        ?  . CAD Neg Hx     Social History   Tobacco Use  . Smoking status: Former Smoker    Types: Cigarettes  . Smokeless tobacco: Never Used  Substance Use Topics  . Alcohol use: No    Comment: socially  . Drug use: No    Home Medications Prior to Admission medications   Medication Sig Start Date End Date Taking? Authorizing Provider  acetaminophen (TYLENOL) 500 MG tablet Take 500 mg by mouth every 8 (eight) hours as needed for mild pain.      [provider]  albuterol (PROVENTIL HFA;VENTOLIN HFA) 108 (90 BASE) MCG/ACT inhaler Inhale 2 puffs into the lungs every 6 (six) hours as needed for wheezing or shortness of breath. 08/05/15   Saguier, Percell Miller, PA-C  aspirin-acetaminophen-caffeine (EXCEDRIN MIGRAINE) [phone] MG per tablet Take 2 tablets by mouth every 6 (six) hours as needed for headache (headache). Reported on 10/09/2015    [provider]  atorvastatin (LIPITOR) 10 MG tablet Take 1 tablet (10 mg total) by mouth at bedtime. 10/21/15   Colon Branch, MD  buPROPion (WELLBUTRIN XL) 150 MG 24 hr tablet Take 150 mg by mouth daily. Take with wellbutrin xl 300 mg to equal 450 mg    [provider]  buPROPion (WELLBUTRIN XL) 300 MG 24 hr tablet Take 300 mg by mouth daily. Take with wellbutrin xl 150 mg to equal 450 mg    [provider]  carvedilol (COREG) 3.125 MG tablet Take 1 tablet (3.125 mg total) by mouth 2 (two) times daily with a meal. 09/20/17   Mariel Aloe, MD  dextromethorphan-guaiFENesin (MUCINEX DM) 30-600 MG 12hr tablet Take 1 tablet by mouth 2 (two) times daily as needed for cough.    [provider]  ezetimibe (ZETIA) 10 MG tablet Take 10 mg by mouth daily.    [provider]  gabapentin (NEURONTIN) 300 MG capsule Take 1 cap in AM, 1 cap at noon, 2 caps at bedtime Patient taking differently: Take 300-600 mg by mouth 3 (three) times daily. Take 1 capsule every morning and afternoon then take 2 capsules at bedtime. 12/21/14   Cameron Sprang, MD  levothyroxine (SYNTHROID, LEVOTHROID) 100 MCG tablet Take 100 mcg by mouth daily.    [provider]  linagliptin (TRADJENTA) 5 MG TABS tablet Take 1 tablet (5 mg total) by mouth at bedtime. 07/15/16   Colon Branch, MD  meclizine (ANTIVERT) 25 MG tablet Take 25 mg by mouth at bedtime as needed for dizziness.    [provider]  mupirocin ointment (BACTROBAN) 2 % Place 1 application into the nose 2 (two) times daily.  For 3 days 09/20/17   Mariel Aloe, MD  ondansetron (ZOFRAN) 4 MG tablet Take 4 mg by mouth every 8 (eight) hours as needed for nausea or vomiting.    [provider]  OXYGEN Inhale 2 L into the lungs continuous. As directed.     [provider]  polyethylene glycol (MIRALAX / GLYCOLAX) packet Take 17 g by mouth daily as needed for mild constipation. Reported on 10/09/2015    [provider]  Propylene Glycol (SYSTANE BALANCE) 0.6 % SOLN Place 1 drop into both eyes 4 (four) times daily as  needed (for dry eyes).     [provider]  repaglinide (PRANDIN) 0.5 MG tablet Take 1 tablet (0.5 mg total) by mouth 3 (three) times daily before meals. 01/14/16   Colon Branch, MD  rivaroxaban (XARELTO) 20 MG TABS tablet Take 1 tablet (20 mg total) by mouth daily with supper. 09/14/14   Colon Branch, MD  sertraline (ZOLOFT) 25 MG tablet Take 25 mg by mouth daily.    [provider]    Allergies    Penicillins, Codeine, and Sulfonamide derivatives  Review of Systems   Review of Systems  Constitutional: Negative for chills and fever.  HENT: Negative for ear pain and sore throat.   Eyes: Negative for pain and visual disturbance.  Respiratory: Positive for cough. Negative for shortness of breath.   Cardiovascular: Negative for chest pain and palpitations.  Gastrointestinal: Positive for diarrhea and nausea. Negative for abdominal pain and vomiting.  Genitourinary: Negative for dysuria and hematuria.  Musculoskeletal: Negative for arthralgias and back pain.  Skin: Negative for color change and rash.  Neurological: Positive for weakness. Negative for seizures and syncope.  All other systems reviewed and are negative.   Physical Exam Updated Vital Signs  ED Triage Vitals  Enc Vitals Group     BP 10/01/19 1900 123/75     Pulse Rate 10/01/19 1900 76     Resp 10/01/19 1900 19     Temp 10/01/19 1915 98.3 F (36.8 C)     Temp Source 10/01/19 1915 Axillary     SpO2  10/01/19 1900 94 %     Weight --      Height --      Head Circumference --      Peak Flow --      Pain Score 10/01/19 1813 0     Pain Loc --      Pain Edu? --      Excl. in [place]? --     Physical Exam Vitals and nursing note reviewed.  Constitutional:      General: She is not in acute distress.    Appearance: She is well-developed. She is not ill-appearing.  HENT:     Head: Normocephalic and atraumatic.     Nose: Nose normal.     Mouth/Throat:     Mouth: Mucous membranes are moist.  Eyes:     Extraocular Movements: Extraocular movements intact.     Conjunctiva/sclera: Conjunctivae normal.     Pupils: Pupils are equal, round, and reactive to light.  Cardiovascular:     Rate and Rhythm: Normal rate and regular rhythm.     Pulses: Normal pulses.     Heart sounds: Normal heart sounds. No murmur.  Pulmonary:     Effort: Pulmonary effort is normal. No respiratory distress.     Breath sounds: Wheezing present.     Comments: Coarse breath sounds Abdominal:     Palpations: Abdomen is soft.     Tenderness: There is no abdominal tenderness.  Musculoskeletal:     Cervical back: Neck supple.  Skin:    General: Skin is warm and dry.     Capillary Refill: Capillary refill takes less than 2 seconds.  Neurological:     General: No focal deficit present.     Mental Status: She is alert.  Psychiatric:        Mood and Affect: Mood normal.     ED Results / Procedures / Treatments   Labs (all labs ordered are listed, but only  abnormal results are displayed) Labs Reviewed  CBC WITH DIFFERENTIAL/PLATELET - Abnormal; Notable for the following components:      Result Value   RDW 15.8 (*)    All other components within normal limits  COMPREHENSIVE METABOLIC PANEL - Abnormal; Notable for the following components:   BUN 41 (*)    Creatinine, Ser 2.54 (*)    Calcium 8.5 (*)    GFR calc non Af Amer 17 (*)    GFR calc Af Amer 20 (*)    All other components within normal limits  D-DIMER,  QUANTITATIVE (NOT AT [facility]) - Abnormal; Notable for the following components:   D-Dimer, Quant 0.98 (*)    All other components within normal limits  LACTATE DEHYDROGENASE - Abnormal; Notable for the following components:   LDH 227 (*)    All other components within normal limits  FIBRINOGEN - Abnormal; Notable for the following components:   Fibrinogen 771 (*)    All other components within normal limits  C-REACTIVE PROTEIN - Abnormal; Notable for the following components:   CRP 13.6 (*)    All other components within normal limits  BLOOD GAS, VENOUS - Abnormal; Notable for the following components:   Bicarbonate 28.3 (*)    All other components within normal limits  CULTURE, BLOOD (ROUTINE X 2)  CULTURE, BLOOD (ROUTINE X 2)  URINE CULTURE  RESPIRATORY PANEL BY RT PCR (FLU A&B, COVID)  LACTIC ACID, PLASMA  PROCALCITONIN  FERRITIN  TRIGLYCERIDES  URINALYSIS, ROUTINE W REFLEX MICROSCOPIC    EKG EKG shows sinus rhythm.  No ischemic changes.  Normal intervals.  Radiology DG Chest Port 1 View  Result Date: 10/01/2019 CLINICAL DATA:  Tested COVID-19 positive on 09/21/2019, altered mental status, diarrhea, lethargy, hypoxemia, history hypertension, stroke, asthma, COPD, pulmonary embolism, former smoker, diabetes mellitus EXAM: PORTABLE CHEST 1 VIEW COMPARISON:  Portable exam 1809 hours compared to 09/17/2017 FINDINGS: Upper normal heart size. Mediastinal contours and pulmonary vascularity normal. Atherosclerotic calcification aorta. Question minimal infiltrate RIGHT mid lung. Remaining lungs clear. No pleural effusion or pneumothorax. Bones demineralized. IMPRESSION: Question minimal infiltrate lateral RIGHT mid lung. Electronically Signed   By: Lavonia Dana M.D.   On: 10/01/2019 18:53    Procedures Procedures (including critical care time)  Medications Ordered in ED Medications  albuterol (VENTOLIN HFA) 108 (90 Base) MCG/ACT inhaler 4 puff (4 puffs Inhalation Given 10/01/19 1923)    sodium chloride 0.9 % bolus 1,000 mL (1,000 mLs Intravenous New Bag/Given (Non-Interop) 10/01/19 2127)    ED Course  I have reviewed the triage vital signs and the nursing notes.  Pertinent labs & imaging results that were available during my care of the patient were reviewed by me and considered in my medical decision making (see chart for details).    MDM Rules/Calculators/A&P                      Dawn Morrow is an 84 year old female with history of PE on Xarelto, COPD on 2 L of oxygen, stroke, diabetes who presents to the ED with fatigue, diarrhea.  Tested positive for coronavirus on the 14th.  Does not complain of any major shortness of breath or chest pain.  Does continue to have a bad cough with sputum production.  Overall does not have any specific symptoms.  Has had some nausea and GI symptoms.  Vital signs overall unremarkable.  She is on her home 2 L of oxygen.  She does have some coarse breath sounds  on exam.  Will give albuterol.  Will get Covid screening labs and reevaluate.  Concern for COPD exacerbation, pneumonia, ongoing Covid.  Chest x-ray with questionable minimal infiltrate in the left lateral right midlung.  Patient with creatinine of 2.54.  Elevated from baseline.  Likely dehydrated given GI symptoms.  Will give fluid bolus.  Felt better after breathing treatment.  Blood gases unremarkable.  Will discuss with hospitalist about starting antibiotics as possible pneumonia.  Procalcitonin is normal.  Believe patient with dehydration in the setting of coronavirus and diarrhea would benefit from hydration.  This chart was dictated using voice recognition software.  Despite best efforts to proofread,  errors can occur which can change the documentation meaning.  Dawn Morrow was evaluated in Emergency Department on 10/01/2019 for the symptoms described in the history of present illness. She was evaluated in the context of the global COVID-19 pandemic, which necessitated  consideration that the patient might be at risk for infection with the SARS-CoV-2 virus that causes COVID-19. Institutional protocols and algorithms that pertain to the evaluation of patients at risk for COVID-19 are in a state of rapid change based on information released by regulatory bodies including the CDC and federal and state organizations. These policies and algorithms were followed during the patient's care in the ED.   Final Clinical Impression(s) / ED Diagnoses Final diagnoses:  Dehydration  Diarrhea, unspecified type  AKI (acute kidney injury) ([place])  Chronic obstructive pulmonary disease, unspecified COPD type [facility])    Rx / DC Orders ED Discharge Orders    None       Lennice Sites, DO 10/01/19 2143

## 2019-10-01 NOTE — ED Triage Notes (Signed)
84 yo female BIB [facility] from [place] on [address]. Pt tested COVID + 09/21/19. EMS called for AMS, diarrhea, and lethargy. Pt spo2 low 90s on 3L upon EMS arrival. Pt wears 2L of O2 at baseline. 18g L AC, 400 ns given en route. Pt AOX4 en route per EMS.  Vitals: Bp 136/75 Hr 78 spo2 94 on 3 L  rr 22 Temp 99.1 cbg 155

## 2019-10-02 ENCOUNTER — Encounter (HOSPITAL_COMMUNITY): Payer: Self-pay | Admitting: Internal Medicine

## 2019-10-02 ENCOUNTER — Other Ambulatory Visit: Payer: Self-pay

## 2019-10-02 DIAGNOSIS — Z9981 Dependence on supplemental oxygen: Secondary | ICD-10-CM | POA: Diagnosis not present

## 2019-10-02 DIAGNOSIS — J440 Chronic obstructive pulmonary disease with acute lower respiratory infection: Secondary | ICD-10-CM | POA: Diagnosis present

## 2019-10-02 DIAGNOSIS — J181 Lobar pneumonia, unspecified organism: Secondary | ICD-10-CM | POA: Diagnosis present

## 2019-10-02 DIAGNOSIS — E860 Dehydration: Secondary | ICD-10-CM | POA: Diagnosis present

## 2019-10-02 DIAGNOSIS — J208 Acute bronchitis due to other specified organisms: Secondary | ICD-10-CM | POA: Diagnosis present

## 2019-10-02 DIAGNOSIS — N390 Urinary tract infection, site not specified: Secondary | ICD-10-CM

## 2019-10-02 DIAGNOSIS — E669 Obesity, unspecified: Secondary | ICD-10-CM | POA: Diagnosis present

## 2019-10-02 DIAGNOSIS — Z993 Dependence on wheelchair: Secondary | ICD-10-CM | POA: Diagnosis not present

## 2019-10-02 DIAGNOSIS — E785 Hyperlipidemia, unspecified: Secondary | ICD-10-CM | POA: Diagnosis present

## 2019-10-02 DIAGNOSIS — U071 COVID-19: Secondary | ICD-10-CM | POA: Diagnosis present

## 2019-10-02 DIAGNOSIS — M810 Age-related osteoporosis without current pathological fracture: Secondary | ICD-10-CM | POA: Diagnosis present

## 2019-10-02 DIAGNOSIS — Z9089 Acquired absence of other organs: Secondary | ICD-10-CM | POA: Diagnosis not present

## 2019-10-02 DIAGNOSIS — N179 Acute kidney failure, unspecified: Secondary | ICD-10-CM | POA: Diagnosis present

## 2019-10-02 DIAGNOSIS — F419 Anxiety disorder, unspecified: Secondary | ICD-10-CM | POA: Diagnosis present

## 2019-10-02 DIAGNOSIS — Z6835 Body mass index (BMI) 35.0-35.9, adult: Secondary | ICD-10-CM | POA: Diagnosis not present

## 2019-10-02 DIAGNOSIS — I129 Hypertensive chronic kidney disease with stage 1 through stage 4 chronic kidney disease, or unspecified chronic kidney disease: Secondary | ICD-10-CM | POA: Diagnosis present

## 2019-10-02 DIAGNOSIS — Z9049 Acquired absence of other specified parts of digestive tract: Secondary | ICD-10-CM | POA: Diagnosis not present

## 2019-10-02 DIAGNOSIS — J9621 Acute and chronic respiratory failure with hypoxia: Secondary | ICD-10-CM | POA: Diagnosis present

## 2019-10-02 DIAGNOSIS — R197 Diarrhea, unspecified: Secondary | ICD-10-CM | POA: Diagnosis present

## 2019-10-02 DIAGNOSIS — N1831 Chronic kidney disease, stage 3a: Secondary | ICD-10-CM | POA: Diagnosis present

## 2019-10-02 DIAGNOSIS — E039 Hypothyroidism, unspecified: Secondary | ICD-10-CM | POA: Diagnosis present

## 2019-10-02 DIAGNOSIS — H9190 Unspecified hearing loss, unspecified ear: Secondary | ICD-10-CM | POA: Diagnosis present

## 2019-10-02 DIAGNOSIS — E1122 Type 2 diabetes mellitus with diabetic chronic kidney disease: Secondary | ICD-10-CM | POA: Diagnosis present

## 2019-10-02 DIAGNOSIS — F329 Major depressive disorder, single episode, unspecified: Secondary | ICD-10-CM | POA: Diagnosis present

## 2019-10-02 LAB — CBC WITH DIFFERENTIAL/PLATELET
Abs Immature Granulocytes: 0.07 10*3/uL (ref 0.00–0.07)
Basophils Absolute: 0 10*3/uL (ref 0.0–0.1)
Basophils Relative: 0 %
Eosinophils Absolute: 0 10*3/uL (ref 0.0–0.5)
Eosinophils Relative: 0 %
HCT: 41.6 % (ref 36.0–46.0)
Hemoglobin: 13.1 g/dL (ref 12.0–15.0)
Immature Granulocytes: 1 %
Lymphocytes Relative: 9 %
Lymphs Abs: 0.7 10*3/uL (ref 0.7–4.0)
MCH: 29.8 pg (ref 26.0–34.0)
MCHC: 31.5 g/dL (ref 30.0–36.0)
MCV: 94.5 fL (ref 80.0–100.0)
Monocytes Absolute: 0.5 10*3/uL (ref 0.1–1.0)
Monocytes Relative: 6 %
Neutro Abs: 6.4 10*3/uL (ref 1.7–7.7)
Neutrophils Relative %: 84 %
Platelets: 163 10*3/uL (ref 150–400)
RBC: 4.4 MIL/uL (ref 3.87–5.11)
RDW: 15.6 % — ABNORMAL HIGH (ref 11.5–15.5)
WBC: 7.6 10*3/uL (ref 4.0–10.5)
nRBC: 0 % (ref 0.0–0.2)

## 2019-10-02 LAB — RESPIRATORY PANEL BY RT PCR (FLU A&B, COVID)
Influenza A by PCR: NEGATIVE
Influenza B by PCR: NEGATIVE
SARS Coronavirus 2 by RT PCR: POSITIVE — AB

## 2019-10-02 LAB — GLUCOSE, CAPILLARY
Glucose-Capillary: 96 mg/dL (ref 70–99)
Glucose-Capillary: 152 mg/dL — ABNORMAL HIGH (ref 70–99)

## 2019-10-02 LAB — CREATININE, SERUM
Creatinine, Ser: 2.16 mg/dL — ABNORMAL HIGH (ref 0.44–1.00)
GFR calc Af Amer: 24 mL/min — ABNORMAL LOW (ref >60)
GFR calc non Af Amer: 21 mL/min — ABNORMAL LOW (ref >60)

## 2019-10-02 LAB — CBC
HCT: 45.4 % (ref 36.0–46.0)
Hemoglobin: 14.1 g/dL (ref 12.0–15.0)
MCH: 29.4 pg (ref 26.0–34.0)
MCHC: 31.1 g/dL (ref 30.0–36.0)
MCV: 94.6 fL (ref 80.0–100.0)
Platelets: 190 10*3/uL (ref 150–400)
RBC: 4.8 MIL/uL (ref 3.87–5.11)
RDW: 15.7 % — ABNORMAL HIGH (ref 11.5–15.5)
WBC: 8.4 10*3/uL (ref 4.0–10.5)
nRBC: 0 % (ref 0.0–0.2)

## 2019-10-02 LAB — COMPREHENSIVE METABOLIC PANEL
ALT: 17 U/L (ref 0–44)
AST: 26 U/L (ref 15–41)
Albumin: 2.7 g/dL — ABNORMAL LOW (ref 3.5–5.0)
Alkaline Phosphatase: 62 U/L (ref 38–126)
Anion gap: 9 (ref 5–15)
BUN: 31 mg/dL — ABNORMAL HIGH (ref 8–23)
CO2: 25 mmol/L (ref 22–32)
Calcium: 7.3 mg/dL — ABNORMAL LOW (ref 8.9–10.3)
Chloride: 105 mmol/L (ref 98–111)
Creatinine, Ser: 1.6 mg/dL — ABNORMAL HIGH (ref 0.44–1.00)
GFR calc Af Amer: 35 mL/min — ABNORMAL LOW (ref >60)
GFR calc non Af Amer: 30 mL/min — ABNORMAL LOW (ref >60)
Glucose, Bld: 84 mg/dL (ref 70–99)
Potassium: 3.9 mmol/L (ref 3.5–5.1)
Sodium: 139 mmol/L (ref 135–145)
Total Bilirubin: 0.8 mg/dL (ref 0.3–1.2)
Total Protein: 5.9 g/dL — ABNORMAL LOW (ref 6.5–8.1)

## 2019-10-02 LAB — BLOOD GAS, VENOUS
Acid-Base Excess: 1 mmol/L (ref 0.0–2.0)
Bicarbonate: 28.3 mmol/L — ABNORMAL HIGH (ref 20.0–28.0)
O2 Saturation: 41.9 %
Patient temperature: 98.6
pCO2, Ven: 59 mmHg (ref 44.0–60.0)
pH, Ven: 7.302 (ref 7.250–7.430)

## 2019-10-02 LAB — HEMOGLOBIN A1C
Hgb A1c MFr Bld: 6.6 % — ABNORMAL HIGH (ref 4.8–5.6)
Mean Plasma Glucose: 142.72 mg/dL

## 2019-10-02 LAB — ABO/RH: ABO/RH(D): A POS

## 2019-10-02 MED ORDER — EZETIMIBE 10 MG PO TABS
10.0000 mg | ORAL_TABLET | Freq: Every day | ORAL | Status: DC
  Administered 2019-10-02 – 2019-10-03 (×2): 10 mg via ORAL
  Filled 2019-10-02 (×3): qty 1

## 2019-10-02 MED ORDER — ASPIRIN-ACETAMINOPHEN-CAFFEINE 250-250-65 MG PO TABS
2.0000 | ORAL_TABLET | Freq: Four times a day (QID) | ORAL | Status: DC | PRN
  Filled 2019-10-02: qty 2

## 2019-10-02 MED ORDER — LINAGLIPTIN 5 MG PO TABS
5.0000 mg | ORAL_TABLET | Freq: Every day | ORAL | Status: DC
  Administered 2019-10-02 (×2): 5 mg via ORAL
  Filled 2019-10-02 (×2): qty 1

## 2019-10-02 MED ORDER — BUPROPION HCL ER (XL) 300 MG PO TB24
300.0000 mg | ORAL_TABLET | Freq: Every day | ORAL | Status: DC
  Filled 2019-10-02: qty 2

## 2019-10-02 MED ORDER — LORAZEPAM 0.5 MG PO TABS: 0.5000 mg | ORAL_TABLET | Freq: Three times a day (TID) | ORAL | Status: DC | PRN

## 2019-10-02 MED ORDER — ZINC SULFATE 220 (50 ZN) MG PO CAPS
220.0000 mg | ORAL_CAPSULE | Freq: Every day | ORAL | Status: DC
  Filled 2019-10-02: qty 1

## 2019-10-02 MED ORDER — ATORVASTATIN CALCIUM 10 MG PO TABS
10.0000 mg | ORAL_TABLET | Freq: Every day | ORAL | Status: DC
  Administered 2019-10-02: 22:00:00 10 mg via ORAL
  Filled 2019-10-02: qty 1

## 2019-10-02 MED ORDER — RIVAROXABAN 20 MG PO TABS
20.0000 mg | ORAL_TABLET | Freq: Every day | ORAL | Status: DC
  Administered 2019-10-02: 20 mg via ORAL
  Filled 2019-10-02: qty 1

## 2019-10-02 MED ORDER — BUPROPION HCL ER (XL) 150 MG PO TB24
150.0000 mg | ORAL_TABLET | Freq: Every day | ORAL | Status: DC
  Filled 2019-10-02: qty 1

## 2019-10-02 MED ORDER — SERTRALINE HCL 50 MG PO TABS
75.0000 mg | ORAL_TABLET | Freq: Every day | ORAL | Status: DC
  Administered 2019-10-02 – 2019-10-03 (×2): 75 mg via ORAL
  Filled 2019-10-02: qty 1
  Filled 2019-10-02: qty 2

## 2019-10-02 MED ORDER — ACETAMINOPHEN 500 MG PO TABS
500.0000 mg | ORAL_TABLET | Freq: Three times a day (TID) | ORAL | Status: DC
  Administered 2019-10-02 – 2019-10-03 (×4): 500 mg via ORAL
  Filled 2019-10-02 (×4): qty 1

## 2019-10-02 MED ORDER — INSULIN ASPART 100 UNIT/ML ~~LOC~~ SOLN: 0.0000 [IU] | Freq: Every day | SUBCUTANEOUS | Status: DC

## 2019-10-02 MED ORDER — BUPROPION HCL ER (XL) 300 MG PO TB24
450.0000 mg | ORAL_TABLET | Freq: Every day | ORAL | Status: DC
  Administered 2019-10-02 – 2019-10-03 (×2): 450 mg via ORAL
  Filled 2019-10-02: qty 1

## 2019-10-02 MED ORDER — ALBUTEROL SULFATE HFA 108 (90 BASE) MCG/ACT IN AERS: 2.0000 | INHALATION_SPRAY | Freq: Four times a day (QID) | RESPIRATORY_TRACT | Status: DC | PRN

## 2019-10-02 MED ORDER — CARVEDILOL 3.125 MG PO TABS
3.1250 mg | ORAL_TABLET | Freq: Two times a day (BID) | ORAL | Status: DC
  Administered 2019-10-02 – 2019-10-03 (×4): 3.125 mg via ORAL
  Filled 2019-10-02 (×4): qty 1

## 2019-10-02 MED ORDER — LEVOFLOXACIN IN D5W 500 MG/100ML IV SOLN
500.0000 mg | INTRAVENOUS | Status: DC
  Administered 2019-10-02 (×2): 500 mg via INTRAVENOUS
  Filled 2019-10-02 (×4): qty 100

## 2019-10-02 MED ORDER — POLYVINYL ALCOHOL 1.4 % OP SOLN: 1.0000 [drp] | Freq: Four times a day (QID) | OPHTHALMIC | Status: DC | PRN

## 2019-10-02 MED ORDER — NYSTATIN 100000 UNIT/GM EX POWD
1.0000 "application " | Freq: Two times a day (BID) | CUTANEOUS | Status: DC
  Administered 2019-10-02 – 2019-10-03 (×3): 1 via TOPICAL
  Filled 2019-10-02: qty 15

## 2019-10-02 MED ORDER — ZINC SULFATE 220 (50 ZN) MG PO CAPS
220.0000 mg | ORAL_CAPSULE | Freq: Every day | ORAL | Status: DC
  Administered 2019-10-02 – 2019-10-03 (×2): 220 mg via ORAL
  Filled 2019-10-02 (×2): qty 1

## 2019-10-02 MED ORDER — ASCORBIC ACID 500 MG PO TABS
500.0000 mg | ORAL_TABLET | Freq: Every day | ORAL | Status: DC
  Filled 2019-10-02: qty 1

## 2019-10-02 MED ORDER — INSULIN ASPART 100 UNIT/ML ~~LOC~~ SOLN
0.0000 [IU] | Freq: Three times a day (TID) | SUBCUTANEOUS | Status: DC
  Administered 2019-10-03: 12:00:00 1 [IU] via SUBCUTANEOUS

## 2019-10-02 MED ORDER — GABAPENTIN 300 MG PO CAPS
300.0000 mg | ORAL_CAPSULE | Freq: Two times a day (BID) | ORAL | Status: DC
  Administered 2019-10-02 – 2019-10-03 (×4): 300 mg via ORAL
  Filled 2019-10-02 (×4): qty 1

## 2019-10-02 MED ORDER — ASCORBIC ACID 500 MG PO TABS
500.0000 mg | ORAL_TABLET | Freq: Every day | ORAL | Status: DC
  Administered 2019-10-02 – 2019-10-03 (×2): 500 mg via ORAL
  Filled 2019-10-02 (×2): qty 1

## 2019-10-02 MED ORDER — LEVOTHYROXINE SODIUM 88 MCG PO TABS
88.0000 ug | ORAL_TABLET | Freq: Every day | ORAL | Status: DC
  Administered 2019-10-02 – 2019-10-03 (×2): 88 ug via ORAL
  Filled 2019-10-02 (×2): qty 1

## 2019-10-02 MED ORDER — DOCUSATE SODIUM 100 MG PO CAPS
100.0000 mg | ORAL_CAPSULE | Freq: Two times a day (BID) | ORAL | Status: DC
  Administered 2019-10-02 – 2019-10-03 (×4): 100 mg via ORAL
  Filled 2019-10-02 (×4): qty 1

## 2019-10-02 MED ORDER — DM-GUAIFENESIN ER 30-600 MG PO TB12: 1.0000 | ORAL_TABLET | Freq: Two times a day (BID) | ORAL | Status: DC | PRN

## 2019-10-02 MED ORDER — MECLIZINE HCL 25 MG PO TABS: 25.0000 mg | ORAL_TABLET | Freq: Every evening | ORAL | Status: DC | PRN

## 2019-10-02 MED ORDER — GUAIFENESIN-DM 100-10 MG/5ML PO SYRP: 10.0000 mL | ORAL_SOLUTION | ORAL | Status: DC | PRN

## 2019-10-02 MED ORDER — ACETAMINOPHEN 325 MG PO TABS: 650.0000 mg | ORAL_TABLET | Freq: Four times a day (QID) | ORAL | Status: DC | PRN

## 2019-10-02 MED ORDER — METOPROLOL TARTRATE 5 MG/5ML IV SOLN: 5.0000 mg | INTRAVENOUS | Status: DC | PRN

## 2019-10-02 MED ORDER — ENOXAPARIN SODIUM 30 MG/0.3ML ~~LOC~~ SOLN: 30.0000 mg | SUBCUTANEOUS | Status: DC

## 2019-10-02 MED ORDER — MELATONIN 5 MG PO TABS
10.0000 mg | ORAL_TABLET | Freq: Every day | ORAL | Status: DC
  Administered 2019-10-02 (×2): 10 mg via ORAL
  Filled 2019-10-02 (×2): qty 2

## 2019-10-02 NOTE — ED Notes (Signed)
Attempted to call report, no answer, 4W RN will call back when available.

## 2019-10-02 NOTE — Progress Notes (Signed)
PROGRESS NOTE    Dawn Morrow   [id number]  DOB: [birth year]  DOA: 10/01/2019 PCP: Colon Branch, MD   Brief Narrative:  Dawn Morrow is a 84 y.o. 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 Patient was brought to the ER from nursing home on account of generalized weakness.  Patient apparently tested positive for Covid on 09/08/2019.    She presents to the hospital with cough and confusion and is found to be hypoxic requiring 2 L of oxygen to keep pulse ox in the 90s.  She was also found to have a urinary tract infection. Chest x-ray suggested a possible right lower lobe infiltrate and Covid test is positive.   Subjective: She complains of pain under her right breast.  She has a cough.    Assessment & Plan:   Principal Problem: Acute respiratory failure, Covid 19 + - Lobar pneumonia COPD on 2 L O2 at baseline -The patient is hypoxic with a pulse ox in the high 80s when I checked it in the room today -She is requiring 2.5 to 3 L of oxygen to keep her pulse ox in the 90s - she is tachycardic and has tachypnea as well - CRP is 13.6, LDH is 227, D dimer 0.98 and Fibrinogen 771 - as her COVID test was initially postive on 1/1, I do not feel she needs treatment for COVID pneumonia but I we will treat he as lobar pneumonia- she is on levaquin  Active Problems:   Acute lower UTI - cont Levaquin and f/u culture    AKI on CKD (chronic kidney disease), stage III - baseline Cr is about 1.3- she presented with BUN of 41 and Cr of 2.54 -  improved to 31 and 1.60    Hypothyroidism - cont Synthroid  DM 2 - cont SSI and Tradjenta  H/o DVT/ PE - cont Xarelto     Time spent in minutes: 35 DVT prophylaxis: Xarelto Code Status: Full code Family Communication:  Disposition Plan: return to [employer] Consultants:   none Procedures:    none Antimicrobials:  Anti-infectives (From admission, onward)   Start     Dose/Rate Route Frequency Ordered Stop   10/02/19 0100  levofloxacin (LEVAQUIN) IVPB 500 mg     500 mg 100 mL/hr over 60 Minutes Intravenous Every 24 hours 10/02/19 0059         Objective: Vitals:   10/02/19 0927 10/02/19 1035 10/02/19 1053 10/02/19 1054  BP:  130/69 130/69 130/69  Pulse: (!) 56 89 89 89  Resp: 20 18 18 18   Temp:  99 F (37.2 C) 99 F (37.2 C) 99 F (37.2 C)  TempSrc:  Oral Oral Oral  SpO2: 95% 96%    Weight:    95.7 kg  Height:    5\' 5"  (1.651 m)   No intake or output data in the 24 hours ending 10/02/19 1112 Filed Weights   10/02/19 1054  Weight: 95.7 kg    Examination: General exam: Appears comfortable  HEENT: PERRLA, oral mucosa moist, no sclera icterus or thrush Respiratory system: Clear to auscultation. Respiratory effort normal. Cardiovascular system: S1 & S2 heard, RRR.   Gastrointestinal system: Abdomen soft, non-tender, nondistended. Normal bowel sounds. Central nervous system: Alert and oriented to place and person- not time. No focal neurological deficits. Extremities: No cyanosis, clubbing or edema Skin: No rashes or ulcers Psychiatry:  Mood & affect  appropriate.     Data Reviewed: I have personally reviewed following labs and imaging studies  CBC: Recent Labs  Lab 10/01/19 1851 10/02/19 0218 10/02/19 0500  WBC 6.8 8.4 7.6  NEUTROABS 5.5  --  6.4  HGB 14.2 14.1 13.1  HCT 44.9 45.4 41.6  MCV 93.3 94.6 94.5  PLT 190 190 [id number]   Basic Metabolic Panel: Recent Labs  Lab 10/01/19 1851 10/02/19 0218 10/02/19 0500  NA 138  --  139  K 3.9  --  3.9  CL 98  --  105  CO2 27  --  25  GLUCOSE 90  --  84  BUN 41*  --  31*  CREATININE 2.54* 2.16* 1.60*  CALCIUM 8.5*  --  7.3*   GFR: Estimated Creatinine Clearance: 31.6 mL/min (A) (by C-G formula based on SCr of 1.6 mg/dL (H)). Liver Function Tests: Recent Labs  Lab 10/01/19 1851 10/02/19 0500   AST 29 26  ALT 20 17  ALKPHOS 78 62  BILITOT 0.9 0.8  PROT 7.2 5.9*  ALBUMIN 3.5 2.7*   No results for input(s): LIPASE, AMYLASE in the last 168 hours. No results for input(s): AMMONIA in the last 168 hours. Coagulation Profile: No results for input(s): INR, PROTIME in the last 168 hours. Cardiac Enzymes: No results for input(s): CKTOTAL, CKMB, CKMBINDEX, TROPONINI in the last 168 hours. BNP (last 3 results) No results for input(s): PROBNP in the last 8760 hours. HbA1C: No results for input(s): HGBA1C in the last 72 hours. CBG: No results for input(s): GLUCAP in the last 168 hours. Lipid Profile: Recent Labs    10/01/19 1851  TRIG 116   Thyroid Function Tests: No results for input(s): TSH, T4TOTAL, FREET4, T3FREE, THYROIDAB in the last 72 hours. Anemia Panel: Recent Labs    10/01/19 1851  FERRITIN 137   Urine analysis:    Component Value Date/Time   COLORURINE AMBER (A) 10/01/2019 1810   APPEARANCEUR CLOUDY (A) 10/01/2019 1810   LABSPEC 1.023 10/01/2019 1810   PHURINE 5.0 10/01/2019 1810   GLUCOSEU NEGATIVE 10/01/2019 1810   GLUCOSEU NEGATIVE 06/12/2014 1410   HGBUR SMALL (A) 10/01/2019 1810   BILIRUBINUR NEGATIVE 10/01/2019 1810   BILIRUBINUR Neg 06/19/2016 1101   KETONESUR 5 (A) 10/01/2019 1810   PROTEINUR 30 (A) 10/01/2019 1810   UROBILINOGEN >=8.0 06/19/2016 1101   UROBILINOGEN 0.2 05/31/2015 1835   NITRITE NEGATIVE 10/01/2019 1810   LEUKOCYTESUR LARGE (A) 10/01/2019 1810   Sepsis Labs: @LABRCNTIP (procalcitonin:4,lacticidven:4) ) Recent Results (from the past 240 hour(s))  Blood Culture (routine x 2)     Status: None (Preliminary result)   Collection Time: 10/01/19  6:51 PM   Specimen: BLOOD  Result Value Ref Range Status   Specimen Description BLOOD LEFT ANTECUBITAL  Final   Special Requests   Final    BOTTLES DRAWN AEROBIC AND ANAEROBIC Blood Culture adequate volume Performed at [facility], [place] [address] [place],  [place] [address]    Culture PENDING  Incomplete   Report Status PENDING  Incomplete  Blood Culture (routine x 2)     Status: None (Preliminary result)   Collection Time: 10/01/19  6:51 PM   Specimen: BLOOD  Result Value Ref Range Status   Specimen Description BLOOD RIGHT ANTECUBITAL  Final   Special Requests   Final    BOTTLES DRAWN AEROBIC AND ANAEROBIC Blood Culture adequate volume Performed at [place] [address] [place], [place] [address]    Culture PENDING  Incomplete   Report Status PENDING  Incomplete  Respiratory Panel by RT PCR (Flu A&B, Covid) - Nasopharyngeal Swab     Status: Abnormal   Collection Time: 10/01/19  6:51 PM   Specimen: Nasopharyngeal Swab  Result Value Ref Range Status   SARS Coronavirus 2 by RT PCR POSITIVE (A) NEGATIVE Final    Comment: CRITICAL RESULT CALLED TO, READ BACK BY AND VERIFIED WITH: [id number] HAMILTON AT 0108 ON 10/02/2019 BY MOSLEY,J (NOTE) SARS-CoV-2 target nucleic acids are DETECTED. SARS-CoV-2 RNA is generally detectable in upper respiratory specimens  during the acute phase of infection. Positive results are indicative of the presence of the identified virus, but do not rule out bacterial infection or co-infection with other pathogens not detected by the test. Clinical correlation with patient history and other diagnostic information is necessary to determine patient infection status. The expected result is Negative. Fact Sheet for Patients:  PinkCheek.be Fact Sheet for Healthcare Providers: GravelBags.it This test is not yet approved or cleared by the [place] FDA and  has been authorized for detection and/or diagnosis of SARS-CoV-2 by FDA under an Emergency Use Authorization (EUA).  This EUA will remain in effect (meaning th is test can be used) for the duration of  the COVID-19 declaration under Section 564(b)(1) of the Act, 21 U.S.C. section  360bbb-3(b)(1), unless the authorization is terminated or revoked sooner.    Influenza A by PCR NEGATIVE NEGATIVE Final   Influenza B by PCR NEGATIVE NEGATIVE Final    Comment: (NOTE) The Xpert Xpress SARS-CoV-2/FLU/RSV assay is intended as an aid in  the diagnosis of influenza from Nasopharyngeal swab specimens and  should not be used as a sole basis for treatment. Nasal washings and  aspirates are unacceptable for Xpert Xpress SARS-CoV-2/FLU/RSV  testing. Fact Sheet for Patients: PinkCheek.be Fact Sheet for Healthcare Providers: GravelBags.it This test is not yet approved or cleared by the [place] FDA and  has been authorized for detection and/or diagnosis of SARS-CoV-2 by  FDA under an Emergency Use Authorization (EUA). This EUA will remain  in effect (meaning this test can be used) for the duration of the  Covid-19 declaration under Section 564(b)(1) of the Act, 21  U.S.C. section 360bbb-3(b)(1), unless the authorization is  terminated or revoked. Performed at [facility], [place] [address], [place], [place] [address]          Radiology Studies: DG Chest Port 1 View  Result Date: 10/01/2019 CLINICAL DATA:  Tested COVID-19 positive on 09/21/2019, altered mental status, diarrhea, lethargy, hypoxemia, history hypertension, stroke, asthma, COPD, pulmonary embolism, former smoker, diabetes mellitus EXAM: PORTABLE CHEST 1 VIEW COMPARISON:  Portable exam 1809 hours compared to 09/17/2017 FINDINGS: Upper normal heart size. Mediastinal contours and pulmonary vascularity normal. Atherosclerotic calcification aorta. Question minimal infiltrate RIGHT mid lung. Remaining lungs clear. No pleural effusion or pneumothorax. Bones demineralized. IMPRESSION: Question minimal infiltrate lateral RIGHT mid lung. Electronically Signed   By: Lavonia Dana M.D.   On: 10/01/2019 18:53      Scheduled Meds: . acetaminophen   500 mg Oral TID  . vitamin C  500 mg Oral Daily  . atorvastatin  10 mg Oral QHS  . buPROPion  450 mg Oral Daily  . carvedilol  3.125 mg Oral BID WC  . docusate sodium  100 mg Oral BID  . ezetimibe  10 mg Oral Daily  . gabapentin  300 mg Oral BID  . guaiFENesin  600 mg Oral BID  . levothyroxine  88  mcg Oral QAC breakfast  . linagliptin  5 mg Oral QHS  . Melatonin  10 mg Oral QHS  . nystatin  1 application Topical BID  . rivaroxaban  20 mg Oral Q supper  . sertraline  75 mg Oral Daily  . zinc sulfate  220 mg Oral Daily   Continuous Infusions: . levofloxacin (LEVAQUIN) IV Stopped (10/02/19 0344)     LOS: 0 days      Debbe Odea, MD [facility] Pager: [email or web address] Password [facility] 10/02/2019, 11:12 AM

## 2019-10-02 NOTE — Progress Notes (Signed)
Pt arrived to unit from ED via stretcher. VSS. Voices no pain at this time. Resting in bed with call light within reach. Educated to use call light for assistance. No open areas noted on skin. IV's clean, dry and intact. PureWick in place for incontinence. Pt A/O x3. Diabetic diet. CBG's ACHS. Requiring 3L/min O2 via [place]. No SOB noted at this time. Took medications without complication. Will cont to mx.

## 2019-10-02 NOTE — Progress Notes (Signed)
Called ED for report, RN currently in isolation room. RN will call back when available.

## 2019-10-02 NOTE — ED Notes (Signed)
5am labs collected, patient repositioned and breakfast tray given.

## 2019-10-03 DIAGNOSIS — J449 Chronic obstructive pulmonary disease, unspecified: Secondary | ICD-10-CM

## 2019-10-03 DIAGNOSIS — N1832 Chronic kidney disease, stage 3b: Secondary | ICD-10-CM

## 2019-10-03 DIAGNOSIS — E039 Hypothyroidism, unspecified: Secondary | ICD-10-CM

## 2019-10-03 DIAGNOSIS — E860 Dehydration: Secondary | ICD-10-CM

## 2019-10-03 LAB — CBC WITH DIFFERENTIAL/PLATELET
Abs Immature Granulocytes: 0.09 10*3/uL — ABNORMAL HIGH (ref 0.00–0.07)
Basophils Absolute: 0 10*3/uL (ref 0.0–0.1)
Basophils Relative: 0 %
Eosinophils Absolute: 0 10*3/uL (ref 0.0–0.5)
Eosinophils Relative: 1 %
HCT: 43.2 % (ref 36.0–46.0)
Hemoglobin: 14 g/dL (ref 12.0–15.0)
Immature Granulocytes: 1 %
Lymphocytes Relative: 10 %
Lymphs Abs: 0.7 10*3/uL (ref 0.7–4.0)
MCH: 29.9 pg (ref 26.0–34.0)
MCHC: 32.4 g/dL (ref 30.0–36.0)
MCV: 92.3 fL (ref 80.0–100.0)
Monocytes Absolute: 0.5 10*3/uL (ref 0.1–1.0)
Monocytes Relative: 7 %
Neutro Abs: 5.4 10*3/uL (ref 1.7–7.7)
Neutrophils Relative %: 81 %
Platelets: 141 10*3/uL — ABNORMAL LOW (ref 150–400)
RBC: 4.68 MIL/uL (ref 3.87–5.11)
RDW: 15.6 % — ABNORMAL HIGH (ref 11.5–15.5)
WBC: 6.6 10*3/uL (ref 4.0–10.5)
nRBC: 0 % (ref 0.0–0.2)

## 2019-10-03 LAB — URINE CULTURE: Culture: >=100000 — AB

## 2019-10-03 LAB — GLUCOSE, CAPILLARY
Glucose-Capillary: 100 mg/dL — ABNORMAL HIGH (ref 70–99)
Glucose-Capillary: 137 mg/dL — ABNORMAL HIGH (ref 70–99)

## 2019-10-03 LAB — COMPREHENSIVE METABOLIC PANEL
ALT: 18 U/L (ref 0–44)
AST: 39 U/L (ref 15–41)
Albumin: 2.7 g/dL — ABNORMAL LOW (ref 3.5–5.0)
Alkaline Phosphatase: 65 U/L (ref 38–126)
Anion gap: 12 (ref 5–15)
BUN: 28 mg/dL — ABNORMAL HIGH (ref 8–23)
CO2: 23 mmol/L (ref 22–32)
Calcium: 8.5 mg/dL — ABNORMAL LOW (ref 8.9–10.3)
Chloride: 104 mmol/L (ref 98–111)
Creatinine, Ser: 1.32 mg/dL — ABNORMAL HIGH (ref 0.44–1.00)
GFR calc Af Amer: 44 mL/min — ABNORMAL LOW (ref >60)
GFR calc non Af Amer: 38 mL/min — ABNORMAL LOW (ref >60)
Glucose, Bld: 92 mg/dL (ref 70–99)
Potassium: 4.1 mmol/L (ref 3.5–5.1)
Sodium: 139 mmol/L (ref 135–145)
Total Bilirubin: 0.9 mg/dL (ref 0.3–1.2)
Total Protein: 6.4 g/dL — ABNORMAL LOW (ref 6.5–8.1)

## 2019-10-03 MED ORDER — LEVOFLOXACIN 500 MG PO TABS: 500.0000 mg | ORAL_TABLET | Freq: Every day | ORAL | 0 refills | Status: AC

## 2019-10-03 MED ORDER — LEVOFLOXACIN 500 MG PO TABS: 500.0000 mg | ORAL_TABLET | Freq: Every day | ORAL | 0 refills | Status: DC

## 2019-10-03 NOTE — NC FL2 (Signed)
[place] LEVEL OF CARE SCREENING TOOL     IDENTIFICATION  Patient Name: Dawn Morrow Birthdate: 12-Mar-1936 Sex: female Admission Date (Current Location): 10/01/2019  [place] and [place] Number:  Kathleen Argue [id number] [place] and Address:  [employer],  [place], [place]      Provider Number: [id number]  Attending Physician Name and Address:  Debbe Odea, MD  Relative Name and Phone Number:  Merrilee Jansky Niece   [phone] or Ailene Ravel" Relative   [phone] or Julianne Rice   [phone]    Current Level of Care: Hospital Recommended Level of Care: [place] Prior Approval Number:    Date Approved/Denied:   PASRR Number:    Discharge Plan: Domiciliary (Rest home)([facility])    Current Diagnoses: Patient Active Problem List   Diagnosis Date Noted  . COVID-19 virus infection 10/02/2019  . Acute lower UTI 10/02/2019  . Acute kidney failure ([place]) 10/01/2019  . Syncope 09/18/2017  . Near syncope 09/17/2017  . Diabetes mellitus type 2 in obese ([place]) 09/17/2017  . Atrial fibrillation with RVR ([place]) 09/17/2017  . Hypomagnesemia 09/17/2017  . CKD (chronic kidney disease), stage III 09/17/2017  . Forehead contusion   . MGUS (monoclonal gammopathy of unknown significance) 08/13/2016  . Renal failure 07/09/2015  . Follow-up ---------PCP NOTES 05/17/2015  . Essential tremor 12/28/2014  . Ulnar neuropathy of left upper extremity 12/28/2014  . Intractable pain 06/21/2014  . Back pain 06/21/2014  . Lumbar radiculopathy 06/21/2014  . Encounter for therapeutic drug monitoring 03/14/2014  . Numbness 10/18/2013  . TIA (transient ischemic attack) 09/19/2013  . Weakness 09/18/2013  . Annual physical exam 05/30/2012  . Tremor 01/21/2012  . Failure to thrive and poor med compliance  01/21/2012  . PE (pulmonary embolism) 11/18/2010  . DVT (deep venous thrombosis) ([place])  11/18/2010  . Long term current use of anticoagulant 11/18/2010  . ABNORMAL ELECTROCARDIOGRAM 11/10/2010  . OSTEOARTHRITIS 08/06/2010  . DIZZINESS 04/15/2010  . Diabetes ([place]) 06/04/2009  . ACNE ROSACEA 11/28/2008  . BACK PAIN 05/16/2008  . HIP PAIN, RIGHT, CHRONIC 09/15/2007  . Hypothyroidism 09/13/2007  . Dyslipidemia 09/13/2007  . Osteoporosis 07/22/2007  . DEPRESSION 09/11/2006  . HTN (hypertension) 09/11/2006  . ASTHMA 09/11/2006  . COPD (chronic obstructive pulmonary disease) ([place]) 09/11/2006  . GERD 09/11/2006  . Recurrent UTI 09/11/2006  . [place] FILTER INSERTION, HX OF 09/11/2006    Orientation RESPIRATION BLADDER Height & Weight     Self, Time, Situation, Place  O2 Incontinent Weight: 210 lb 15.7 oz (95.7 kg) Height:  5\' 5"  (165.1 cm)  BEHAVIORAL SYMPTOMS/MOOD NEUROLOGICAL BOWEL NUTRITION STATUS      Continent Diet(Low sodium heart healthy.)  AMBULATORY STATUS COMMUNICATION OF NEEDS Skin   Supervision Verbally Normal                       Personal Care Assistance Level of Assistance  Bathing, Dressing, Feeding Bathing Assistance: Limited assistance Feeding assistance: Independent Dressing Assistance: Limited assistance     Functional Limitations Info  Sight, Hearing, Speech Sight Info: Adequate Hearing Info: Adequate Speech Info: Adequate    SPECIAL CARE FACTORS FREQUENCY                       Contractures Contractures Info: Not present    Additional Factors Info  Code Status, Allergies, Psychotropic, Isolation Precautions Code Status Info: Full Code Allergies Info: Penicillins Codeine Sulfonamide Derivatives Psychotropic Info: buPROPion (WELLBUTRIN XL)  24 hr tablet 450 mg and sertraline (ZOLOFT) tablet 75 mg   Isolation Precautions Info: History of MRSA and air/con precautions     Current Medications (10/03/2019):  This is the current hospital active medication list Current Facility-Administered Medications  Medication Dose Route  Frequency Provider Last Rate Last Admin  . acetaminophen (TYLENOL) tablet 500 mg  500 mg Oral TID Artist Beach, MD   500 mg at 10/03/19 1018  . acetaminophen (TYLENOL) tablet 650 mg  650 mg Oral Q6H PRN Acheampong, Warnell Bureau, MD      . albuterol (VENTOLIN HFA) 108 (90 Base) MCG/ACT inhaler 2 puff  2 puff Inhalation Q6H PRN Acheampong, Warnell Bureau, MD      . ascorbic acid (VITAMIN C) tablet 500 mg  500 mg Oral Daily Acheampong, Warnell Bureau, MD   500 mg at 10/03/19 1019  . aspirin-acetaminophen-caffeine (EXCEDRIN MIGRAINE) per tablet 2 tablet  2 tablet Oral Q6H PRN Acheampong, Warnell Bureau, MD      . atorvastatin (LIPITOR) tablet 10 mg  10 mg Oral QHS Acheampong, Warnell Bureau, MD   10 mg at 10/02/19 2228  . buPROPion (WELLBUTRIN XL) 24 hr tablet 450 mg  450 mg Oral Daily Debbe Odea, MD   450 mg at 10/03/19 1018  . carvedilol (COREG) tablet 3.125 mg  3.125 mg Oral BID WC Acheampong, Warnell Bureau, MD   3.125 mg at 10/03/19 0818  . dextromethorphan-guaiFENesin (MUCINEX DM) 30-600 MG per 12 hr tablet 1 tablet  1 tablet Oral BID PRN Acheampong, Warnell Bureau, MD      . docusate sodium (COLACE) capsule 100 mg  100 mg Oral BID Artist Beach, MD   100 mg at 10/03/19 1019  . ezetimibe (ZETIA) tablet 10 mg  10 mg Oral Daily Acheampong, Warnell Bureau, MD   10 mg at 10/03/19 1019  . gabapentin (NEURONTIN) capsule 300 mg  300 mg Oral BID Acheampong, Warnell Bureau, MD   300 mg at 10/03/19 1019  . guaiFENesin (MUCINEX) 12 hr tablet 600 mg  600 mg Oral BID Artist Beach, MD   600 mg at 10/03/19 1019  . guaiFENesin-dextromethorphan (ROBITUSSIN DM) 100-10 MG/5ML syrup 10 mL  10 mL Oral Q4H PRN Acheampong, Warnell Bureau, MD      . insulin aspart (novoLOG) injection 0-5 Units  0-5 Units Subcutaneous QHS Rizwan, Saima, MD      . insulin aspart (novoLOG) injection 0-9 Units  0-9 Units Subcutaneous TID WC Rizwan, Saima, MD      . levofloxacin (LEVAQUIN) IVPB 500 mg  500 mg Intravenous Q24H Acheampong, Warnell Bureau, MD   Stopped at 10/02/19 2336  .  levothyroxine (SYNTHROID) tablet 88 mcg  88 mcg Oral QAC breakfast Artist Beach, MD   88 mcg at 10/03/19 [id number]  . linagliptin (TRADJENTA) tablet 5 mg  5 mg Oral QHS Acheampong, Warnell Bureau, MD   5 mg at 10/02/19 2228  . LORazepam (ATIVAN) tablet 0.5 mg  0.5 mg Oral Q8H PRN Acheampong, Warnell Bureau, MD      . meclizine (ANTIVERT) tablet 25 mg  25 mg Oral QHS PRN Acheampong, Warnell Bureau, MD      . Melatonin TABS 10 mg  10 mg Oral QHS Acheampong, Warnell Bureau, MD   10 mg at 10/02/19 2228  . metoprolol tartrate (LOPRESSOR) injection 5 mg  5 mg Intravenous Q4H PRN Bodenheimer, Charles A, NP      . nystatin (MYCOSTATIN/NYSTOP) topical powder 1 application  1 application Topical BID Jeanmarie Plant  K, MD   1 application at [id number]  . polyvinyl alcohol (LIQUIFILM TEARS) 1.4 % ophthalmic solution 1 drop  1 drop Both Eyes QID PRN Acheampong, Warnell Bureau, MD      . rivaroxaban Alveda Reasons) tablet 20 mg  20 mg Oral Q supper Acheampong, Warnell Bureau, MD   20 mg at 10/02/19 1621  . sertraline (ZOLOFT) tablet 75 mg  75 mg Oral Daily Acheampong, Warnell Bureau, MD   75 mg at 10/03/19 1019  . zinc sulfate capsule 220 mg  220 mg Oral Daily Acheampong, Warnell Bureau, MD   220 mg at 10/03/19 1019     Discharge Medications: TAKE these medications   acetaminophen 500 MG tablet Commonly known as: TYLENOL Take 500 mg by mouth 3 (three) times daily.   albuterol 108 (90 Base) MCG/ACT inhaler Commonly known as: VENTOLIN HFA Inhale 2 puffs into the lungs every 6 (six) hours as needed for wheezing or shortness of breath.   aspirin-acetaminophen-caffeine 250-250-65 MG tablet Commonly known as: EXCEDRIN MIGRAINE Take 2 tablets by mouth every 6 (six) hours as needed for headache (headache). Reported on 10/09/2015   atorvastatin 10 MG tablet Commonly known as: LIPITOR Take 1 tablet (10 mg total) by mouth at bedtime.   buPROPion 300 MG 24 hr tablet Commonly known as: WELLBUTRIN XL Take 300 mg by mouth daily. Take with wellbutrin xl 150 mg to  equal 450 mg   buPROPion 150 MG 24 hr tablet Commonly known as: WELLBUTRIN XL Take 150 mg by mouth daily. Take with wellbutrin xl 300 mg to equal 450 mg   carvedilol 3.125 MG tablet Commonly known as: COREG Take 1 tablet (3.125 mg total) by mouth 2 (two) times daily with a meal.   cholecalciferol 25 MCG (1000 UNIT) tablet Commonly known as: VITAMIN D3 Take 2,000 Units by mouth daily.   dextromethorphan-guaiFENesin 30-600 MG 12hr tablet Commonly known as: MUCINEX DM Take 1 tablet by mouth 2 (two) times daily as needed for cough.   docusate sodium 100 MG capsule Commonly known as: COLACE Take 100 mg by mouth daily.   ezetimibe 10 MG tablet Commonly known as: ZETIA Take 10 mg by mouth daily.   gabapentin 300 MG capsule Commonly known as: NEURONTIN Take 1 cap in AM, 1 cap at noon, 2 caps at bedtime What changed:   how much to take  how to take this  when to take this  additional instructions   ketoconazole 2 % shampoo Commonly known as: NIZORAL Apply 1 application topically 2 (two) times a week.   levofloxacin 500 MG tablet Commonly known as: Levaquin Take 1 tablet (500 mg total) by mouth daily for 10 days.   levothyroxine 88 MCG tablet Commonly known as: SYNTHROID Take 88 mcg by mouth daily before breakfast.   linagliptin 5 MG Tabs tablet Commonly known as: Tradjenta Take 1 tablet (5 mg total) by mouth at bedtime.   LORazepam 0.5 MG tablet Commonly known as: ATIVAN Take 0.5 mg by mouth every 8 (eight) hours as needed for anxiety.   meclizine 25 MG tablet Commonly known as: ANTIVERT Take 25 mg by mouth at bedtime as needed for dizziness.   Melatonin 10 MG Caps Take 10 mg by mouth at bedtime.   nystatin powder Generic drug: nystatin Apply 1 application topically 2 (two) times daily.   ondansetron 4 MG tablet Commonly known as: ZOFRAN Take 4 mg by mouth every 8 (eight) hours as needed for nausea or vomiting.   OXYGEN Inhale 2  L  into the lungs continuous. As directed.   polyethylene glycol 17 g packet Commonly known as: MIRALAX / GLYCOLAX Take 17 g by mouth daily as needed for mild constipation. Reported on 10/09/2015   repaglinide 0.5 MG tablet Commonly known as: Prandin Take 1 tablet (0.5 mg total) by mouth 3 (three) times daily before meals.   rivaroxaban 20 MG Tabs tablet Commonly known as: Xarelto Take 1 tablet (20 mg total) by mouth daily with supper.   sertraline 25 MG tablet Commonly known as: ZOLOFT Take 75 mg by mouth daily.   Systane Balance 0.6 % Soln Generic drug: Propylene Glycol Place 1 drop into both eyes 4 (four) times daily as needed (for dry eyes).   vitamin C 100 MG tablet Take 500 mg by mouth daily.   zinc sulfate 220 (50 Zn) MG capsule Take 220 mg by mouth daily.     Relevant Imaging Results:  Relevant Lab Results:   Additional Information SSN [id number]  Ross Ludwig, LCSW

## 2019-10-03 NOTE — TOC Transition Note (Addendum)
Transition of Care [facility]) - CM/SW Discharge Note   Patient Details  Name: Dawn Morrow MRN: [id number] Date of Birth: 30-Oct-1935  Transition of Care [facility]) CM/SW Contact:  Ross Ludwig, LCSW Phone Number: 10/03/2019, 11:54 AM   Clinical Narrative:     CSW was informed that patient is from [facility] on [address].  CSW spoke to [place], and she stated that patient can return once they receive a copy of the FL2 and the discharge summary.  CSW faxed requested documentation to [phone] and also to [phone].  She requested that any new medications have hard prescriptions sent with her.  Patient normally is minimum assistance at the ALF, and gets around in her wheelchair.  Patient to be d/c'ed today to [facility] on [address].  Patient and family agreeable to plans will transport via ems RN to call report to [phone].  CSW updated patient's niece [name] that patient is discharging back to ALF today.       Final next level of care: Assisted Living Barriers to Discharge: Barriers Resolved   Patient Goals and CMS Choice Patient states their goals for this hospitalization and ongoing recovery are:: To return back to [facility]   Choice offered to / list presented to : NA  Discharge Placement  Patient discharging back to [facility]              Patient to be transferred to facility by: [place] Name of family member notified: Sherri Patient and family notified of of transfer: 10/03/19  Discharge Plan and Services    Back to ALF                      [facility] Arranged: NA          Social Determinants of Health (SDOH) Interventions     Readmission Risk Interventions   ED to Hosp-Admission (Current) from 10/01/2019 in [place]   10/03/19   1211  Readmission Prevention Plan - High Risk   Transportation Screening Complete  PCP or Specialist appointment within 5-7 days of discharge Complete  Home Care Consult  (High Risk) Not Complete  Home Care Consult (High Risk) Not Complete Comment Patient at her baseline  [place] for recovery care planning/counseling (includes patient and caregiver) Complete  High Risk Palliative Care Screening Not Applicable  Medication Review Referral to Pharmacy

## 2019-10-03 NOTE — Progress Notes (Signed)
PHARMACY - PHYSICIAN COMMUNICATION CRITICAL VALUE ALERT - BLOOD CULTURE IDENTIFICATION (BCID)  Dawn Morrow is an 84 y.o. female who presented to [facility] on 10/01/2019 with a chief complaint of generalized weakness  Assessment:  Pt found to have UTI and Covid positive  Name of physician (or Provider) Contacted: Dr. Wynelle Cleveland  Current antibiotics: levaquin  Changes to prescribed antibiotics recommended:  none  No results found for this or any previous visit.  Dolly Rias RPh 10/03/2019, 8:12 AM

## 2019-10-03 NOTE — Discharge Summary (Signed)
Physician Discharge Summary  AMIREE SEE [id number] DOB: 21-Aug-1936 DOA: 10/01/2019  PCP: Colon Branch, MD  Admit date: 10/01/2019 Discharge date: 10/03/2019  Admitted From: ALF Disposition:  ALF  Recommendations for Outpatient Follow-up:  1. Levaquin can be stopped on 10/08/19  Home Health:  None- she is wheelchair bound  Discharge Condition:  stable   CODE STATUS:  Full code   Diet recommendation:  Diabetic and heart healthy Consultations:  none    Discharge Diagnoses:  Principal Problem: Lobar pneumonia Active Problems:   COVID-19 virus infection   Acute lower UTI  Acute kidney failure wiyj  CKD (chronic kidney disease), stage IIIb   Hypothyroidism   Diabetes ([place])   COPD (chronic obstructive pulmonary disease) ([place])         Brief Summary: Dawn Morrow is a 84 y.o.femalewith medical history significant ofrecurrent PE/DVTon chronic anticoagulation on Xarelto,chronichypertension,CKD Stage 3A, Hyperlipidemia, diabetes mellitus type 2. Patientwas brought to the ERfrom nursing homeon account of generalized weakness.Patient apparently tested positive for Covid on 09/08/2019.   She presents to the hospital with cough and confusion and is found to be hypoxic requiring 2 L of oxygen to keep pulse ox in the 90s.  She was also found to have a urinary tract infection. Chest x-ray suggested a possible right lower lobe infiltrate and Covid test is positive. She is also noted to have an AKI with a Cr of 2.54.   Hospital Course:  Principal Problem: Acute respiratory failure, Covid 19 + - Lobar pneumonia COPD on 2 L O2 at baseline -She was requiring 2.5 to 3 L of oxygen to keep her pulse ox in the 90s and was tachycardic and tachypneic  - CRP is 13.6, LDH is 227, D dimer 0.98 and Fibrinogen 771 - as her COVID test was initially postive on 1/1, I do not feel she needs treatment for COVID pneumonia but I we will treat he as lobar pneumonia- she is on levaquin which was  started in the ED by "Dr Phineas Inches- she can continue the treatment for a total of 7 days which will treat her pneumonia and her UTI  Active Problems:   Acute lower UTI - cont Levaquin - culture shows > 100,000 gram neg rods- will follow up on this to ensure she dose not have a resistant organism    AKI on CKD (chronic kidney disease), stage III - baseline Cr is about 1.3- she presented with BUN of 41 and Cr of 2.54 -  improved to 28 and 1.32 today - encourage oral hydration    Hypothyroidism - cont Synthroid  DM 2 - cont SSI and Tradjenta  H/o DVT/ PE - cont Xarelto  Discharge Exam: Vitals:   10/02/19 2120 10/03/19 0513  BP: (!) 135/56 (!) 160/81  Pulse: 61 83  Resp: 16 18  Temp: 98.2 F (36.8 C) 97.9 F (36.6 C)  SpO2: 93% 97%   Vitals:   10/02/19 1054 10/02/19 1432 10/02/19 2120 10/03/19 0513  BP: 130/69 136/83 (!) 135/56 (!) 160/81  Pulse: 89 83 61 83  Resp: 18 20 16 18   Temp: 99 F (37.2 C) 98.1 F (36.7 C) 98.2 F (36.8 C) 97.9 F (36.6 C)  TempSrc: Oral  Oral Oral  SpO2:  96% 93% 97%  Weight: 95.7 kg     Height: 5\' 5"  (1.651 m)       General: Pt is alert, awake, not in acute distress Cardiovascular: RRR, S1/S2 +, no rubs, no gallops Respiratory: CTA bilaterally,  no wheezing, no rhonchi Abdominal: Soft, NT, ND, bowel sounds + Extremities: no edema, no cyanosis   Discharge Instructions  Discharge Instructions    Diet - low sodium heart healthy   Complete by: As directed    Increase activity slowly   Complete by: As directed      Allergies as of 10/03/2019      Reactions   Penicillins Anaphylaxis   Codeine Other (See Comments)   unknown   Sulfonamide Derivatives Other (See Comments)   unknown      Medication List    TAKE these medications   acetaminophen 500 MG tablet Commonly known as: TYLENOL Take 500 mg by mouth 3 (three) times daily.   albuterol 108 (90 Base) MCG/ACT inhaler Commonly known as: VENTOLIN HFA Inhale 2 puffs  into the lungs every 6 (six) hours as needed for wheezing or shortness of breath.   aspirin-acetaminophen-caffeine 250-250-65 MG tablet Commonly known as: EXCEDRIN MIGRAINE Take 2 tablets by mouth every 6 (six) hours as needed for headache (headache). Reported on 10/09/2015   atorvastatin 10 MG tablet Commonly known as: LIPITOR Take 1 tablet (10 mg total) by mouth at bedtime.   buPROPion 300 MG 24 hr tablet Commonly known as: WELLBUTRIN XL Take 300 mg by mouth daily. Take with wellbutrin xl 150 mg to equal 450 mg   buPROPion 150 MG 24 hr tablet Commonly known as: WELLBUTRIN XL Take 150 mg by mouth daily. Take with wellbutrin xl 300 mg to equal 450 mg   carvedilol 3.125 MG tablet Commonly known as: COREG Take 1 tablet (3.125 mg total) by mouth 2 (two) times daily with a meal.   cholecalciferol 25 MCG (1000 UNIT) tablet Commonly known as: VITAMIN D3 Take 2,000 Units by mouth daily.   dextromethorphan-guaiFENesin 30-600 MG 12hr tablet Commonly known as: MUCINEX DM Take 1 tablet by mouth 2 (two) times daily as needed for cough.   docusate sodium 100 MG capsule Commonly known as: COLACE Take 100 mg by mouth daily.   ezetimibe 10 MG tablet Commonly known as: ZETIA Take 10 mg by mouth daily.   gabapentin 300 MG capsule Commonly known as: NEURONTIN Take 1 cap in AM, 1 cap at noon, 2 caps at bedtime What changed:   how much to take  how to take this  when to take this  additional instructions   ketoconazole 2 % shampoo Commonly known as: NIZORAL Apply 1 application topically 2 (two) times a week.   levofloxacin 500 MG tablet Commonly known as: Levaquin Take 1 tablet (500 mg total) by mouth daily for 10 days.   levothyroxine 88 MCG tablet Commonly known as: SYNTHROID Take 88 mcg by mouth daily before breakfast.   linagliptin 5 MG Tabs tablet Commonly known as: Tradjenta Take 1 tablet (5 mg total) by mouth at bedtime.   LORazepam 0.5 MG tablet Commonly known as:  ATIVAN Take 0.5 mg by mouth every 8 (eight) hours as needed for anxiety.   meclizine 25 MG tablet Commonly known as: ANTIVERT Take 25 mg by mouth at bedtime as needed for dizziness.   Melatonin 10 MG Caps Take 10 mg by mouth at bedtime.   nystatin powder Generic drug: nystatin Apply 1 application topically 2 (two) times daily.   ondansetron 4 MG tablet Commonly known as: ZOFRAN Take 4 mg by mouth every 8 (eight) hours as needed for nausea or vomiting.   OXYGEN Inhale 2 L into the lungs continuous. As directed.   polyethylene glycol 17 g  packet Commonly known as: MIRALAX / GLYCOLAX Take 17 g by mouth daily as needed for mild constipation. Reported on 10/09/2015   repaglinide 0.5 MG tablet Commonly known as: Prandin Take 1 tablet (0.5 mg total) by mouth 3 (three) times daily before meals.   rivaroxaban 20 MG Tabs tablet Commonly known as: Xarelto Take 1 tablet (20 mg total) by mouth daily with supper.   sertraline 25 MG tablet Commonly known as: ZOLOFT Take 75 mg by mouth daily.   Systane Balance 0.6 % Soln Generic drug: Propylene Glycol Place 1 drop into both eyes 4 (four) times daily as needed (for dry eyes).   vitamin C 100 MG tablet Take 500 mg by mouth daily.   zinc sulfate 220 (50 Zn) MG capsule Take 220 mg by mouth daily.       Allergies  Allergen Reactions  . Penicillins Anaphylaxis  . Codeine Other (See Comments)    unknown  . Sulfonamide Derivatives Other (See Comments)    unknown     Procedures/Studies:   DG Chest Port 1 View  Result Date: 10/01/2019 CLINICAL DATA:  Tested COVID-19 positive on 09/21/2019, altered mental status, diarrhea, lethargy, hypoxemia, history hypertension, stroke, asthma, COPD, pulmonary embolism, former smoker, diabetes mellitus EXAM: PORTABLE CHEST 1 VIEW COMPARISON:  Portable exam 1809 hours compared to 09/17/2017 FINDINGS: Upper normal heart size. Mediastinal contours and pulmonary vascularity normal. Atherosclerotic  calcification aorta. Question minimal infiltrate RIGHT mid lung. Remaining lungs clear. No pleural effusion or pneumothorax. Bones demineralized. IMPRESSION: Question minimal infiltrate lateral RIGHT mid lung. Electronically Signed   By: Lavonia Dana M.D.   On: 10/01/2019 18:53      The results of significant diagnostics from this hospitalization (including imaging, microbiology, ancillary and laboratory) are listed below for reference.     Microbiology: Recent Results (from the past 240 hour(s))  Urine culture     Status: Abnormal (Preliminary result)   Collection Time: 10/01/19  6:10 PM   Specimen: Urine, Random  Result Value Ref Range Status   Specimen Description   Final    URINE, RANDOM Performed at [place] [address] [place], [place] [address]    Special Requests   Final    NONE Performed at [facility] [place] [address] [place], [place] [address]    Culture (A)  Final    >=100,000 COLONIES/mL GRAM NEGATIVE RODS IDENTIFICATION AND SUSCEPTIBILITIES TO FOLLOW Performed at [facility], [place] [address], [place], [place] [address]    Report Status PENDING  Incomplete  Blood Culture (routine x 2)     Status: None (Preliminary result)   Collection Time: 10/01/19  6:51 PM   Specimen: BLOOD  Result Value Ref Range Status   Specimen Description BLOOD LEFT ANTECUBITAL  Final   Special Requests   Final    BOTTLES DRAWN AEROBIC AND ANAEROBIC Blood Culture adequate volume Performed at [place] [address], [place], [place] [address]    Culture PENDING  Incomplete   Report Status PENDING  Incomplete  Blood Culture (routine x 2)     Status: None (Preliminary result)   Collection Time: 10/01/19  6:51 PM   Specimen: BLOOD  Result Value Ref Range Status   Specimen Description BLOOD RIGHT ANTECUBITAL  Final   Special Requests   Final    BOTTLES DRAWN AEROBIC AND ANAEROBIC Blood Culture adequate  volume Performed at [place] [address], [place], [place] [address]  Culture  Setup Time   Final    GRAM POSITIVE COCCI AEROBIC BOTTLE ONLY CRITICAL RESULT CALLED TO, READ BACK BY AND VERIFIED WITH: Walker M HICKS [id number] AT 61 AM BY CM Performed at [facility], [place] [address], [place], [place] [address]    Culture GRAM POSITIVE COCCI  Final   Report Status PENDING  Incomplete  Respiratory Panel by RT PCR (Flu A&B, Covid) - Nasopharyngeal Swab     Status: Abnormal   Collection Time: 10/01/19  6:51 PM   Specimen: Nasopharyngeal Swab  Result Value Ref Range Status   SARS Coronavirus 2 by RT PCR POSITIVE (A) NEGATIVE Final    Comment: CRITICAL RESULT CALLED TO, READ BACK BY AND VERIFIED WITH: GA0WAD HAMILTON AT 0108 ON 10/02/2019 BY MOSLEY,J (NOTE) SARS-CoV-2 target nucleic acids are DETECTED. SARS-CoV-2 RNA is generally detectable in upper respiratory specimens  during the acute phase of infection. Positive results are indicative of the presence of the identified virus, but do not rule out bacterial infection or co-infection with other pathogens not detected by the test. Clinical correlation with patient history and other diagnostic information is necessary to determine patient infection status. The expected result is Negative. Fact Sheet for Patients:  PinkCheek.be Fact Sheet for Healthcare Providers: GravelBags.it This test is not yet approved or cleared by the [place] FDA and  has been authorized for detection and/or diagnosis of SARS-CoV-2 by FDA under an Emergency Use Authorization (EUA).  This EUA will remain in effect (meaning th is test can be used) for the duration of  the COVID-19 declaration under Section 564(b)(1) of the Act, 21 U.S.C. section 360bbb-3(b)(1), unless the authorization is terminated or revoked sooner.    Influenza A by PCR NEGATIVE NEGATIVE Final    Influenza B by PCR NEGATIVE NEGATIVE Final    Comment: (NOTE) The Xpert Xpress SARS-CoV-2/FLU/RSV assay is intended as an aid in  the diagnosis of influenza from Nasopharyngeal swab specimens and  should not be used as a sole basis for treatment. Nasal washings and  aspirates are unacceptable for Xpert Xpress SARS-CoV-2/FLU/RSV  testing. Fact Sheet for Patients: PinkCheek.be Fact Sheet for Healthcare Providers: GravelBags.it This test is not yet approved or cleared by the [place] FDA and  has been authorized for detection and/or diagnosis of SARS-CoV-2 by  FDA under an Emergency Use Authorization (EUA). This EUA will remain  in effect (meaning this test can be used) for the duration of the  Covid-19 declaration under Section 564(b)(1) of the Act, 21  U.S.C. section 360bbb-3(b)(1), unless the authorization is  terminated or revoked. Performed at [facility], [place] [address], [place], [place] [address]      Labs: BNP (last 3 results) No results for input(s): BNP in the last 8760 hours. Basic Metabolic Panel: Recent Labs  Lab 10/01/19 1851 10/02/19 0218 10/02/19 0500 10/03/19 0321  NA 138  --  139 139  K 3.9  --  3.9 4.1  CL 98  --  105 104  CO2 27  --  25 23  GLUCOSE 90  --  84 92  BUN 41*  --  31* 28*  CREATININE 2.54* 2.16* 1.60* 1.32*  CALCIUM 8.5*  --  7.3* 8.5*   Liver Function Tests: Recent Labs  Lab 10/01/19 1851 10/02/19 0500 10/03/19 0321  AST 29 26 39  ALT 20 17 18   ALKPHOS 78 62 65  BILITOT 0.9 0.8 0.9  PROT 7.2 5.9* 6.4*  ALBUMIN 3.5 2.7* 2.7*  No results for input(s): LIPASE, AMYLASE in the last 168 hours. No results for input(s): AMMONIA in the last 168 hours. CBC: Recent Labs  Lab 10/01/19 1851 10/02/19 0218 10/02/19 0500 10/03/19 0321  WBC 6.8 8.4 7.6 6.6  NEUTROABS 5.5  --  6.4 5.4  HGB 14.2 14.1 13.1 14.0  HCT 44.9 45.4 41.6 43.2  MCV 93.3 94.6 94.5  92.3  PLT 190 190 163 141*   Cardiac Enzymes: No results for input(s): CKTOTAL, CKMB, CKMBINDEX, TROPONINI in the last 168 hours. BNP: Invalid input(s): POCBNP CBG: Recent Labs  Lab 10/02/19 1138 10/02/19 2114 10/03/19 0731  GLUCAP 96 152* 100*   D-Dimer Recent Labs    10/01/19 1851  DDIMER 0.98*   Hgb A1c Recent Labs    10/02/19 1209  HGBA1C 6.6*   Lipid Profile Recent Labs    10/01/19 1851  TRIG 116   Thyroid function studies No results for input(s): TSH, T4TOTAL, T3FREE, THYROIDAB in the last 72 hours.  Invalid input(s): FREET3 Anemia work up Recent Labs    10/01/19 1851  FERRITIN 137   Urinalysis    Component Value Date/Time   COLORURINE AMBER (A) 10/01/2019 1810   APPEARANCEUR CLOUDY (A) 10/01/2019 1810   LABSPEC 1.023 10/01/2019 1810   PHURINE 5.0 10/01/2019 1810   GLUCOSEU NEGATIVE 10/01/2019 1810   GLUCOSEU NEGATIVE 06/12/2014 1410   HGBUR SMALL (A) 10/01/2019 1810   BILIRUBINUR NEGATIVE 10/01/2019 1810   BILIRUBINUR Neg 06/19/2016 1101   KETONESUR 5 (A) 10/01/2019 1810   PROTEINUR 30 (A) 10/01/2019 1810   UROBILINOGEN >=8.0 06/19/2016 1101   UROBILINOGEN 0.2 05/31/2015 1835   NITRITE NEGATIVE 10/01/2019 1810   LEUKOCYTESUR LARGE (A) 10/01/2019 1810   Sepsis Labs Invalid input(s): PROCALCITONIN,  WBC,  LACTICIDVEN Microbiology Recent Results (from the past 240 hour(s))  Urine culture     Status: Abnormal (Preliminary result)   Collection Time: 10/01/19  6:10 PM   Specimen: Urine, Random  Result Value Ref Range Status   Specimen Description   Final    URINE, RANDOM Performed at [facility], [place] [address] [place], [place] [address]    Special Requests   Final    NONE Performed at [facility], [place] [address] [place], [place] [address]    Culture (A)  Final    >=100,000 COLONIES/mL GRAM NEGATIVE RODS IDENTIFICATION AND SUSCEPTIBILITIES TO FOLLOW Performed at [facility], [place]  [address], [place], [place] [address]    Report Status PENDING  Incomplete  Blood Culture (routine x 2)     Status: None (Preliminary result)   Collection Time: 10/01/19  6:51 PM   Specimen: BLOOD  Result Value Ref Range Status   Specimen Description BLOOD LEFT ANTECUBITAL  Final   Special Requests   Final    BOTTLES DRAWN AEROBIC AND ANAEROBIC Blood Culture adequate volume Performed at [place] [address], [place], [place] [address]    Culture PENDING  Incomplete   Report Status PENDING  Incomplete  Blood Culture (routine x 2)     Status: None (Preliminary result)   Collection Time: 10/01/19  6:51 PM   Specimen: BLOOD  Result Value Ref Range Status   Specimen Description BLOOD RIGHT ANTECUBITAL  Final   Special Requests   Final    BOTTLES DRAWN AEROBIC AND ANAEROBIC Blood Culture adequate volume Performed at [place] [address], [place], [place] [address]    Culture  Setup Time  Final    GRAM POSITIVE COCCI AEROBIC BOTTLE ONLY CRITICAL RESULT CALLED TO, READ BACK BY AND VERIFIED WITH: [place] M HICKS [id number] AT 55 AM BY CM Performed at [facility], [place] [address], [place], [place] [address]    Culture GRAM POSITIVE COCCI  Final   Report Status PENDING  Incomplete  Respiratory Panel by RT PCR (Flu A&B, Covid) - Nasopharyngeal Swab     Status: Abnormal   Collection Time: 10/01/19  6:51 PM   Specimen: Nasopharyngeal Swab  Result Value Ref Range Status   SARS Coronavirus 2 by RT PCR POSITIVE (A) NEGATIVE Final    Comment: CRITICAL RESULT CALLED TO, READ BACK BY AND VERIFIED WITH: GA0WAD HAMILTON AT 0108 ON 10/02/2019 BY MOSLEY,J (NOTE) SARS-CoV-2 target nucleic acids are DETECTED. SARS-CoV-2 RNA is generally detectable in upper respiratory specimens  during the acute phase of infection. Positive results are indicative of the presence of the identified virus, but do not rule out bacterial infection or co-infection with other  pathogens not detected by the test. Clinical correlation with patient history and other diagnostic information is necessary to determine patient infection status. The expected result is Negative. Fact Sheet for Patients:  PinkCheek.be Fact Sheet for Healthcare Providers: GravelBags.it This test is not yet approved or cleared by the [place] FDA and  has been authorized for detection and/or diagnosis of SARS-CoV-2 by FDA under an Emergency Use Authorization (EUA).  This EUA will remain in effect (meaning th is test can be used) for the duration of  the COVID-19 declaration under Section 564(b)(1) of the Act, 21 U.S.C. section 360bbb-3(b)(1), unless the authorization is terminated or revoked sooner.    Influenza A by PCR NEGATIVE NEGATIVE Final   Influenza B by PCR NEGATIVE NEGATIVE Final    Comment: (NOTE) The Xpert Xpress SARS-CoV-2/FLU/RSV assay is intended as an aid in  the diagnosis of influenza from Nasopharyngeal swab specimens and  should not be used as a sole basis for treatment. Nasal washings and  aspirates are unacceptable for Xpert Xpress SARS-CoV-2/FLU/RSV  testing. Fact Sheet for Patients: PinkCheek.be Fact Sheet for Healthcare Providers: GravelBags.it This test is not yet approved or cleared by the [place] FDA and  has been authorized for detection and/or diagnosis of SARS-CoV-2 by  FDA under an Emergency Use Authorization (EUA). This EUA will remain  in effect (meaning this test can be used) for the duration of the  Covid-19 declaration under Section 564(b)(1) of the Act, 21  U.S.C. section 360bbb-3(b)(1), unless the authorization is  terminated or revoked. Performed at [facility], [place] [address], [place], [place] [address]      Time coordinating discharge in minutes: 15  SIGNED:   Debbe Odea, MD  [facility] 10/03/2019, 8:34 AM Pager   If 7PM-7AM, please contact night-coverage [email or web address] Password [email or web address]

## 2019-10-03 NOTE — Progress Notes (Signed)
Discharged patient via EMS to [facility] facility SNF. Discharge packet sent with EMS and report called to nurse at facility. Patient stable and alert at time of transfer.

## 2019-10-04 LAB — CULTURE, BLOOD (ROUTINE X 2): Special Requests: ADEQUATE

## 2019-10-07 LAB — CULTURE, BLOOD (ROUTINE X 2)
Culture: NO GROWTH
Special Requests: ADEQUATE

## 2019-12-14 ENCOUNTER — Emergency Department (HOSPITAL_COMMUNITY): Payer: Medicare Other

## 2019-12-14 ENCOUNTER — Other Ambulatory Visit: Payer: Self-pay

## 2019-12-14 ENCOUNTER — Inpatient Hospital Stay (HOSPITAL_COMMUNITY)
Admission: EM | Admit: 2019-12-14 | Discharge: 2019-12-18 | DRG: 871 | Disposition: A | Discharge status: Nursing Home (Includes ICF) | Payer: Medicare Other | Source: Skilled Nursing Facility | Attending: Internal Medicine | Admitting: Internal Medicine

## 2019-12-14 ENCOUNTER — Encounter (HOSPITAL_COMMUNITY): Payer: Self-pay

## 2019-12-14 DIAGNOSIS — E1151 Type 2 diabetes mellitus with diabetic peripheral angiopathy without gangrene: Secondary | ICD-10-CM | POA: Diagnosis present

## 2019-12-14 DIAGNOSIS — G9341 Metabolic encephalopathy: Secondary | ICD-10-CM | POA: Diagnosis present

## 2019-12-14 DIAGNOSIS — Z885 Allergy status to narcotic agent status: Secondary | ICD-10-CM | POA: Diagnosis not present

## 2019-12-14 DIAGNOSIS — Z79890 Hormone replacement therapy (postmenopausal): Secondary | ICD-10-CM

## 2019-12-14 DIAGNOSIS — F419 Anxiety disorder, unspecified: Secondary | ICD-10-CM | POA: Diagnosis present

## 2019-12-14 DIAGNOSIS — R410 Disorientation, unspecified: Secondary | ICD-10-CM

## 2019-12-14 DIAGNOSIS — Z8616 Personal history of covid-19: Secondary | ICD-10-CM

## 2019-12-14 DIAGNOSIS — Z87891 Personal history of nicotine dependence: Secondary | ICD-10-CM

## 2019-12-14 DIAGNOSIS — Z87892 Personal history of anaphylaxis: Secondary | ICD-10-CM

## 2019-12-14 DIAGNOSIS — R6520 Severe sepsis without septic shock: Secondary | ICD-10-CM

## 2019-12-14 DIAGNOSIS — Z9981 Dependence on supplemental oxygen: Secondary | ICD-10-CM

## 2019-12-14 DIAGNOSIS — A419 Sepsis, unspecified organism: Secondary | ICD-10-CM | POA: Diagnosis not present

## 2019-12-14 DIAGNOSIS — E785 Hyperlipidemia, unspecified: Secondary | ICD-10-CM | POA: Diagnosis present

## 2019-12-14 DIAGNOSIS — I129 Hypertensive chronic kidney disease with stage 1 through stage 4 chronic kidney disease, or unspecified chronic kidney disease: Secondary | ICD-10-CM | POA: Diagnosis present

## 2019-12-14 DIAGNOSIS — Z7901 Long term (current) use of anticoagulants: Secondary | ICD-10-CM

## 2019-12-14 DIAGNOSIS — N390 Urinary tract infection, site not specified: Secondary | ICD-10-CM | POA: Diagnosis present

## 2019-12-14 DIAGNOSIS — E039 Hypothyroidism, unspecified: Secondary | ICD-10-CM | POA: Diagnosis present

## 2019-12-14 DIAGNOSIS — Z882 Allergy status to sulfonamides status: Secondary | ICD-10-CM | POA: Diagnosis not present

## 2019-12-14 DIAGNOSIS — Z981 Arthrodesis status: Secondary | ICD-10-CM

## 2019-12-14 DIAGNOSIS — E1122 Type 2 diabetes mellitus with diabetic chronic kidney disease: Secondary | ICD-10-CM | POA: Diagnosis present

## 2019-12-14 DIAGNOSIS — Z86711 Personal history of pulmonary embolism: Secondary | ICD-10-CM

## 2019-12-14 DIAGNOSIS — K219 Gastro-esophageal reflux disease without esophagitis: Secondary | ICD-10-CM | POA: Diagnosis present

## 2019-12-14 DIAGNOSIS — I4891 Unspecified atrial fibrillation: Secondary | ICD-10-CM | POA: Diagnosis present

## 2019-12-14 DIAGNOSIS — G9340 Encephalopathy, unspecified: Secondary | ICD-10-CM | POA: Diagnosis present

## 2019-12-14 DIAGNOSIS — R4182 Altered mental status, unspecified: Secondary | ICD-10-CM

## 2019-12-14 DIAGNOSIS — J449 Chronic obstructive pulmonary disease, unspecified: Secondary | ICD-10-CM | POA: Diagnosis present

## 2019-12-14 DIAGNOSIS — A4151 Sepsis due to Escherichia coli [E. coli]: Principal | ICD-10-CM | POA: Diagnosis present

## 2019-12-14 DIAGNOSIS — Z9049 Acquired absence of other specified parts of digestive tract: Secondary | ICD-10-CM

## 2019-12-14 DIAGNOSIS — M8580 Other specified disorders of bone density and structure, unspecified site: Secondary | ICD-10-CM | POA: Diagnosis present

## 2019-12-14 DIAGNOSIS — Z20822 Contact with and (suspected) exposure to covid-19: Secondary | ICD-10-CM | POA: Diagnosis present

## 2019-12-14 DIAGNOSIS — Z86718 Personal history of other venous thrombosis and embolism: Secondary | ICD-10-CM

## 2019-12-14 DIAGNOSIS — Z880 Allergy status to penicillin: Secondary | ICD-10-CM | POA: Diagnosis not present

## 2019-12-14 DIAGNOSIS — E119 Type 2 diabetes mellitus without complications: Secondary | ICD-10-CM

## 2019-12-14 DIAGNOSIS — H9190 Unspecified hearing loss, unspecified ear: Secondary | ICD-10-CM | POA: Diagnosis present

## 2019-12-14 DIAGNOSIS — N1831 Chronic kidney disease, stage 3a: Secondary | ICD-10-CM | POA: Diagnosis present

## 2019-12-14 DIAGNOSIS — I10 Essential (primary) hypertension: Secondary | ICD-10-CM | POA: Diagnosis present

## 2019-12-14 DIAGNOSIS — Z79899 Other long term (current) drug therapy: Secondary | ICD-10-CM

## 2019-12-14 DIAGNOSIS — Z8673 Personal history of transient ischemic attack (TIA), and cerebral infarction without residual deficits: Secondary | ICD-10-CM

## 2019-12-14 DIAGNOSIS — Z833 Family history of diabetes mellitus: Secondary | ICD-10-CM

## 2019-12-14 LAB — CBC WITH DIFFERENTIAL/PLATELET
Abs Immature Granulocytes: 0.04 10*3/uL (ref 0.00–0.07)
Basophils Absolute: 0 10*3/uL (ref 0.0–0.1)
Basophils Relative: 1 %
Eosinophils Absolute: 0.1 10*3/uL (ref 0.0–0.5)
Eosinophils Relative: 1 %
HCT: 40.8 % (ref 36.0–46.0)
Hemoglobin: 12.4 g/dL (ref 12.0–15.0)
Immature Granulocytes: 1 %
Lymphocytes Relative: 7 %
Lymphs Abs: 0.4 10*3/uL — ABNORMAL LOW (ref 0.7–4.0)
MCH: 30.2 pg (ref 26.0–34.0)
MCHC: 30.4 g/dL (ref 30.0–36.0)
MCV: 99.3 fL (ref 80.0–100.0)
Monocytes Absolute: 0.3 10*3/uL (ref 0.1–1.0)
Monocytes Relative: 5 %
Neutro Abs: 4.9 10*3/uL (ref 1.7–7.7)
Neutrophils Relative %: 85 %
Platelets: 169 10*3/uL (ref 150–400)
RBC: 4.11 MIL/uL (ref 3.87–5.11)
RDW: 17.1 % — ABNORMAL HIGH (ref 11.5–15.5)
WBC: 5.7 10*3/uL (ref 4.0–10.5)
nRBC: 0 % (ref 0.0–0.2)

## 2019-12-14 LAB — COMPREHENSIVE METABOLIC PANEL
ALT: 15 U/L (ref 0–44)
AST: 20 U/L (ref 15–41)
Albumin: 3.3 g/dL — ABNORMAL LOW (ref 3.5–5.0)
Alkaline Phosphatase: 71 U/L (ref 38–126)
Anion gap: 10 (ref 5–15)
BUN: 18 mg/dL (ref 8–23)
CO2: 27 mmol/L (ref 22–32)
Calcium: 8.6 mg/dL — ABNORMAL LOW (ref 8.9–10.3)
Chloride: 104 mmol/L (ref 98–111)
Creatinine, Ser: 1.45 mg/dL — ABNORMAL HIGH (ref 0.44–1.00)
GFR calc Af Amer: 39 mL/min — ABNORMAL LOW (ref >60)
GFR calc non Af Amer: 34 mL/min — ABNORMAL LOW (ref >60)
Glucose, Bld: 104 mg/dL — ABNORMAL HIGH (ref 70–99)
Potassium: 4.4 mmol/L (ref 3.5–5.1)
Sodium: 141 mmol/L (ref 135–145)
Total Bilirubin: 0.6 mg/dL (ref 0.3–1.2)
Total Protein: 6.8 g/dL (ref 6.5–8.1)

## 2019-12-14 LAB — URINALYSIS, ROUTINE W REFLEX MICROSCOPIC
Bilirubin Urine: NEGATIVE
Glucose, UA: NEGATIVE mg/dL
Ketones, ur: NEGATIVE mg/dL
Nitrite: POSITIVE — AB
Protein, ur: 30 mg/dL — AB
Specific Gravity, Urine: 1.015 (ref 1.005–1.030)
pH: 6 (ref 5.0–8.0)

## 2019-12-14 LAB — PROTIME-INR
INR: 1.6 — ABNORMAL HIGH (ref 0.8–1.2)
Prothrombin Time: 18.6 seconds — ABNORMAL HIGH (ref 11.4–15.2)

## 2019-12-14 LAB — RESPIRATORY PANEL BY RT PCR (FLU A&B, COVID)
Influenza A by PCR: NEGATIVE
Influenza B by PCR: NEGATIVE
SARS Coronavirus 2 by RT PCR: NEGATIVE

## 2019-12-14 LAB — APTT: aPTT: 37 seconds — ABNORMAL HIGH (ref 24–36)

## 2019-12-14 LAB — LACTIC ACID, PLASMA
Lactic Acid, Venous: 1.9 mmol/L (ref 0.5–1.9)
Lactic Acid, Venous: 1.5 mmol/L (ref 0.5–1.9)

## 2019-12-14 LAB — GLUCOSE, CAPILLARY: Glucose-Capillary: 139 mg/dL — ABNORMAL HIGH (ref 70–99)

## 2019-12-14 LAB — CBG MONITORING, ED
Glucose-Capillary: 78 mg/dL (ref 70–99)
Glucose-Capillary: 86 mg/dL (ref 70–99)

## 2019-12-14 MED ORDER — RIVAROXABAN 20 MG PO TABS
20.0000 mg | ORAL_TABLET | Freq: Every day | ORAL | Status: DC
  Administered 2019-12-14 – 2019-12-17 (×4): 20 mg via ORAL
  Filled 2019-12-14 (×4): qty 1

## 2019-12-14 MED ORDER — SENNOSIDES-DOCUSATE SODIUM 8.6-50 MG PO TABS: 1.0000 | ORAL_TABLET | Freq: Every evening | ORAL | Status: DC | PRN

## 2019-12-14 MED ORDER — ACETAMINOPHEN 325 MG PO TABS: 650.0000 mg | ORAL_TABLET | Freq: Four times a day (QID) | ORAL | Status: DC | PRN

## 2019-12-14 MED ORDER — SODIUM CHLORIDE 0.9 % IV SOLN: INTRAVENOUS | Status: DC

## 2019-12-14 MED ORDER — SODIUM CHLORIDE 0.9 % IV SOLN
2.0000 g | Freq: Once | INTRAVENOUS | Status: AC
  Administered 2019-12-14: 2 g via INTRAVENOUS
  Filled 2019-12-14: qty 2

## 2019-12-14 MED ORDER — SODIUM CHLORIDE 0.9 % IV BOLUS
1000.0000 mL | Freq: Once | INTRAVENOUS | Status: AC
  Administered 2019-12-14: 1000 mL via INTRAVENOUS

## 2019-12-14 MED ORDER — POLYETHYLENE GLYCOL 3350 17 G PO PACK: 17.0000 g | PACK | Freq: Every day | ORAL | Status: DC | PRN

## 2019-12-14 MED ORDER — EZETIMIBE 10 MG PO TABS
10.0000 mg | ORAL_TABLET | Freq: Every day | ORAL | Status: DC
  Administered 2019-12-15 – 2019-12-18 (×4): 10 mg via ORAL
  Filled 2019-12-14 (×4): qty 1

## 2019-12-14 MED ORDER — ONDANSETRON HCL 4 MG PO TABS: 4.0000 mg | ORAL_TABLET | Freq: Four times a day (QID) | ORAL | Status: DC | PRN

## 2019-12-14 MED ORDER — LEVOTHYROXINE SODIUM 88 MCG PO TABS
88.0000 ug | ORAL_TABLET | Freq: Every day | ORAL | Status: DC
  Administered 2019-12-15 – 2019-12-18 (×4): 88 ug via ORAL
  Filled 2019-12-14 (×4): qty 1

## 2019-12-14 MED ORDER — CARVEDILOL 3.125 MG PO TABS
3.1250 mg | ORAL_TABLET | Freq: Two times a day (BID) | ORAL | Status: DC
  Administered 2019-12-14 – 2019-12-16 (×5): 3.125 mg via ORAL
  Filled 2019-12-14 (×7): qty 1

## 2019-12-14 MED ORDER — ATORVASTATIN CALCIUM 10 MG PO TABS
10.0000 mg | ORAL_TABLET | Freq: Every day | ORAL | Status: DC
  Administered 2019-12-14 – 2019-12-17 (×4): 10 mg via ORAL
  Filled 2019-12-14 (×4): qty 1

## 2019-12-14 MED ORDER — GABAPENTIN 100 MG PO CAPS
200.0000 mg | ORAL_CAPSULE | Freq: Two times a day (BID) | ORAL | Status: DC
  Administered 2019-12-14 – 2019-12-18 (×8): 200 mg via ORAL
  Filled 2019-12-14 (×8): qty 2

## 2019-12-14 MED ORDER — ALBUTEROL SULFATE (2.5 MG/3ML) 0.083% IN NEBU: 2.5000 mg | INHALATION_SOLUTION | Freq: Four times a day (QID) | RESPIRATORY_TRACT | Status: DC | PRN

## 2019-12-14 MED ORDER — ACETAMINOPHEN 500 MG PO TABS
1000.0000 mg | ORAL_TABLET | Freq: Once | ORAL | Status: AC
  Administered 2019-12-14: 1000 mg via ORAL
  Filled 2019-12-14: qty 2

## 2019-12-14 MED ORDER — MECLIZINE HCL 12.5 MG PO TABS: 25.0000 mg | ORAL_TABLET | Freq: Every evening | ORAL | Status: DC | PRN

## 2019-12-14 MED ORDER — INSULIN ASPART 100 UNIT/ML ~~LOC~~ SOLN: 0.0000 [IU] | Freq: Every day | SUBCUTANEOUS | Status: DC

## 2019-12-14 MED ORDER — NYSTATIN 100000 UNIT/GM EX POWD
1.0000 "application " | Freq: Two times a day (BID) | CUTANEOUS | Status: DC
  Administered 2019-12-14 – 2019-12-18 (×8): 1 via TOPICAL
  Filled 2019-12-14 (×2): qty 15

## 2019-12-14 MED ORDER — VANCOMYCIN HCL 1250 MG/250ML IV SOLN: 1250.0000 mg | INTRAVENOUS | Status: DC

## 2019-12-14 MED ORDER — METRONIDAZOLE IN NACL 5-0.79 MG/ML-% IV SOLN
500.0000 mg | Freq: Once | INTRAVENOUS | Status: AC
  Administered 2019-12-14: 500 mg via INTRAVENOUS
  Filled 2019-12-14: qty 100

## 2019-12-14 MED ORDER — SODIUM CHLORIDE 0.9 % IV SOLN
2.0000 g | Freq: Three times a day (TID) | INTRAVENOUS | Status: DC
  Administered 2019-12-14 – 2019-12-15 (×2): 2 g via INTRAVENOUS
  Filled 2019-12-14 (×4): qty 2

## 2019-12-14 MED ORDER — POLYVINYL ALCOHOL 1.4 % OP SOLN
1.0000 [drp] | Freq: Four times a day (QID) | OPHTHALMIC | Status: DC | PRN
  Filled 2019-12-14: qty 15

## 2019-12-14 MED ORDER — VANCOMYCIN HCL 2000 MG/400ML IV SOLN
2000.0000 mg | Freq: Once | INTRAVENOUS | Status: AC
  Administered 2019-12-14: 2000 mg via INTRAVENOUS
  Filled 2019-12-14: qty 400

## 2019-12-14 MED ORDER — METRONIDAZOLE IN NACL 5-0.79 MG/ML-% IV SOLN
500.0000 mg | Freq: Three times a day (TID) | INTRAVENOUS | Status: DC
  Administered 2019-12-14 – 2019-12-16 (×5): 500 mg via INTRAVENOUS
  Filled 2019-12-14 (×5): qty 100

## 2019-12-14 MED ORDER — INSULIN ASPART 100 UNIT/ML ~~LOC~~ SOLN
0.0000 [IU] | Freq: Three times a day (TID) | SUBCUTANEOUS | Status: DC
  Administered 2019-12-16: 1 [IU] via SUBCUTANEOUS
  Administered 2019-12-17: 2 [IU] via SUBCUTANEOUS
  Administered 2019-12-17: 1 [IU] via SUBCUTANEOUS

## 2019-12-14 MED ORDER — VANCOMYCIN HCL IN DEXTROSE 1-5 GM/200ML-% IV SOLN: 1000.0000 mg | Freq: Once | INTRAVENOUS | Status: DC

## 2019-12-14 MED ORDER — ALBUTEROL SULFATE (2.5 MG/3ML) 0.083% IN NEBU: 2.5000 mg | INHALATION_SOLUTION | RESPIRATORY_TRACT | Status: DC | PRN

## 2019-12-14 MED ORDER — ONDANSETRON HCL 4 MG/2ML IJ SOLN: 4.0000 mg | Freq: Four times a day (QID) | INTRAMUSCULAR | Status: DC | PRN

## 2019-12-14 MED ORDER — BUPROPION HCL ER (XL) 150 MG PO TB24
450.0000 mg | ORAL_TABLET | Freq: Every day | ORAL | Status: DC
  Administered 2019-12-15 – 2019-12-17 (×3): 450 mg via ORAL
  Filled 2019-12-14 (×3): qty 3

## 2019-12-14 MED ORDER — LORAZEPAM 0.5 MG PO TABS
0.5000 mg | ORAL_TABLET | Freq: Three times a day (TID) | ORAL | Status: DC | PRN
  Administered 2019-12-15 (×2): 0.5 mg via ORAL
  Filled 2019-12-14 (×2): qty 1

## 2019-12-14 MED ORDER — CHLORHEXIDINE GLUCONATE CLOTH 2 % EX PADS
6.0000 | MEDICATED_PAD | Freq: Every day | CUTANEOUS | Status: DC
  Administered 2019-12-15 – 2019-12-18 (×4): 6 via TOPICAL

## 2019-12-14 MED ORDER — SERTRALINE HCL 50 MG PO TABS
75.0000 mg | ORAL_TABLET | Freq: Every day | ORAL | Status: DC
  Administered 2019-12-15 – 2019-12-18 (×4): 75 mg via ORAL
  Filled 2019-12-14 (×4): qty 2

## 2019-12-14 MED ORDER — ACETAMINOPHEN 650 MG RE SUPP: 650.0000 mg | Freq: Four times a day (QID) | RECTAL | Status: DC | PRN

## 2019-12-14 NOTE — Progress Notes (Signed)
Admission documentation not complete d/t pt not being a good historian.

## 2019-12-14 NOTE — Progress Notes (Signed)
Per telemetry pt had afib. Pt asymptomatic. [place] updated

## 2019-12-14 NOTE — Progress Notes (Signed)
Pharmacy Antibiotic Note  Dawn Morrow is a 84 y.o. female presents on 12/14/2019 with altered mental status and a possible infection of unknown source. Patient has anaphylaxis PCN allergy documented from 09/13/2007. Pharmacy has been consulted for Vancomycin and Aztreonam dosing.  Patient currently febrile with Tm 101.7. WBC wnl, lactate 1.9. Scr elevated at 1.45 (CrCl ~45.6 ml/min using most recent wt of 95 kg from 09/2019).   Plan: Aztreonam 2g IV Q8 hrs Vancomycin 2,000 mg IV once, then 1250mg  IV q48 hrs (est AUC 485.1, Scr used 1.45, Vd 0.5) Monitor renal function, cultures/sensitivities, clinical progression Check vancomycin levels as indicated    Temp (24hrs), Avg:100 F (37.8 C), Min:98.3 F (36.8 C), Max:101.7 F (38.7 C)  Recent Labs  Lab 12/14/19 1214  CREATININE 1.45*    CrCl cannot be calculated (Unknown ideal weight.).    Allergies  Allergen Reactions  . Penicillins Anaphylaxis  . Codeine Other (See Comments)    unknown  . Sulfonamide Derivatives Other (See Comments)    unknown    Antimicrobials this admission: Vancomycin 4/8 >>  Aztreonam 4/8 >>  Flagyl 4/8 x1  Dose adjustments this admission: N/A  Microbiology results: 4/8 BCx:  4/8 UCx:    Richardine Service, PharmD PGY1 Pharmacy Resident Phone: [phone] 12/14/2019  2:56 PM  Please check [email or web address] for unit-specific pharmacy phone numbers.

## 2019-12-14 NOTE — ED Provider Notes (Signed)
[facility] EMERGENCY DEPARTMENT Provider Note   CSN: [id number] Arrival date & time: 12/14/19  1201     History Chief Complaint  Patient presents with  . Altered Mental Status    Dawn Morrow is a 84 y.o. female.  Patient is a 84 year old female who lives at [facility] facility who presents with altered mental status.  She has a history of diabetes, recurrent PE on Xarelto, hypertension, hyperlipidemia and COPD.  She reportedly started having some confusion this morning and generalized weakness.  She was shaky and dropped her breakfast.  She was unsteady on her feet and had difficulty walking due to weakness.  She has baseline tremor per her report but it is worse today.  She normally is alert and oriented x4 per the nursing facility but she has been confused this morning.  Her last known normal is unclear.  She had Covid in January.  She recently got her second vaccine.        Past Medical History:  Diagnosis Date  . Anxiety and depression   . Asthma   . Cataract   . COPD (chronic obstructive pulmonary disease) ([place])   . Diabetes mellitus   . Diverticulosis   . DVT (deep vein thrombosis) in pregnancy    hx x multiple on coumadin  . GERD (gastroesophageal reflux disease)   . Hemorrhoids   . Hiatal hernia   . Hyperlipidemia   . Hypertension   . Hypothyroidism   . On home oxygen therapy    "2L; 24/7" (09/17/2017)  . Osteoarthritis    h/o spinal stenosis by MRI 2009  . Osteopenia   . Pulmonary embolism (HCC)    x 2  . Stroke [facility])    MINI  . Tubular adenoma of colon 07/2005    Patient Active Problem List   Diagnosis Date Noted  . Sepsis ([place]) 12/14/2019  . COVID-19 virus infection 10/02/2019  . Acute lower UTI 10/02/2019  . Acute kidney failure ([place]) 10/01/2019  . Syncope 09/18/2017  . Near syncope 09/17/2017  . Diabetes mellitus type 2 in obese ([place]) 09/17/2017  . Atrial fibrillation with RVR ([place]) 09/17/2017  . Hypomagnesemia  09/17/2017  . CKD (chronic kidney disease), stage III 09/17/2017  . Forehead contusion   . MGUS (monoclonal gammopathy of unknown significance) 08/13/2016  . Renal failure 07/09/2015  . Follow-up ---------PCP NOTES 05/17/2015  . Essential tremor 12/28/2014  . Ulnar neuropathy of left upper extremity 12/28/2014  . Intractable pain 06/21/2014  . Back pain 06/21/2014  . Lumbar radiculopathy 06/21/2014  . Encounter for therapeutic drug monitoring 03/14/2014  . Numbness 10/18/2013  . TIA (transient ischemic attack) 09/19/2013  . Weakness 09/18/2013  . Annual physical exam 05/30/2012  . Tremor 01/21/2012  . Failure to thrive and poor med compliance  01/21/2012  . PE (pulmonary embolism) 11/18/2010  . DVT (deep venous thrombosis) ([place]) 11/18/2010  . Long term current use of anticoagulant 11/18/2010  . ABNORMAL ELECTROCARDIOGRAM 11/10/2010  . OSTEOARTHRITIS 08/06/2010  . DIZZINESS 04/15/2010  . Diabetes ([place]) 06/04/2009  . ACNE ROSACEA 11/28/2008  . BACK PAIN 05/16/2008  . HIP PAIN, RIGHT, CHRONIC 09/15/2007  . Hypothyroidism 09/13/2007  . Dyslipidemia 09/13/2007  . Osteoporosis 07/22/2007  . DEPRESSION 09/11/2006  . HTN (hypertension) 09/11/2006  . ASTHMA 09/11/2006  . COPD (chronic obstructive pulmonary disease) ([place]) 09/11/2006  . GERD 09/11/2006  . Recurrent UTI 09/11/2006  . GREENFIELD FILTER INSERTION, HX OF 09/11/2006    Past Surgical History:  Procedure  Laterality Date  . APPENDECTOMY    . CATARACT EXTRACTION    . CHOLECYSTECTOMY    . LUMBAR FUSION    . POLYPECTOMY    . TONSILLECTOMY    . TUBAL LIGATION       OB History   No obstetric history on file.     Family History  Adopted: Yes  Problem Relation Age of Onset  . Cancer Mother        ? colon or ovarian  . Diabetes Mother   . Cancer Brother        ?  . CAD Neg Hx     Social History   Tobacco Use  . Smoking status: Former Smoker    Types: Cigarettes  . Smokeless tobacco: Never Used  Substance  Use Topics  . Alcohol use: No    Comment: socially  . Drug use: No    Home Medications Prior to Admission medications   Medication Sig Start Date End Date Taking? Authorizing Provider  acetaminophen (TYLENOL) 500 MG tablet Take 500 mg by mouth 3 (three) times daily.     [provider]  albuterol (PROVENTIL HFA;VENTOLIN HFA) 108 (90 BASE) MCG/ACT inhaler Inhale 2 puffs into the lungs every 6 (six) hours as needed for wheezing or shortness of breath. 08/05/15   Saguier, Percell Miller, PA-C  aspirin-acetaminophen-caffeine (EXCEDRIN MIGRAINE) [id number] MG per tablet Take 2 tablets by mouth every 6 (six) hours as needed for headache (headache). Reported on 10/09/2015    [provider]  atorvastatin (LIPITOR) 10 MG tablet Take 1 tablet (10 mg total) by mouth at bedtime. 10/21/15   Colon Branch, MD  buPROPion (WELLBUTRIN XL) 150 MG 24 hr tablet Take 150 mg by mouth daily. Take with wellbutrin xl 300 mg to equal 450 mg    [provider]  buPROPion (WELLBUTRIN XL) 300 MG 24 hr tablet Take 300 mg by mouth daily. Take with wellbutrin xl 150 mg to equal 450 mg    [provider]  carvedilol (COREG) 3.125 MG tablet Take 1 tablet (3.125 mg total) by mouth 2 (two) times daily with a meal. 09/20/17   Mariel Aloe, MD  docusate sodium (COLACE) 100 MG capsule Take 100 mg by mouth daily.    [provider]  ezetimibe (ZETIA) 10 MG tablet Take 10 mg by mouth daily.    [provider]  gabapentin (NEURONTIN) 300 MG capsule Take 1 cap in AM, 1 cap at noon, 2 caps at bedtime Patient taking differently: Take 300-600 mg by mouth 3 (three) times daily. Take 1 capsule every morning and afternoon then take 2 capsules at bedtime. 12/21/14   Cameron Sprang, MD  ketoconazole (NIZORAL) 2 % shampoo Apply 1 application topically 2 (two) times a week.    [provider]  levothyroxine (SYNTHROID) 88 MCG tablet Take 88 mcg by mouth daily before breakfast.    [provider]  linagliptin (TRADJENTA) 5 MG TABS tablet Take 1 tablet (5 mg total) by mouth at bedtime. 07/15/16   Colon Branch, MD  LORazepam (ATIVAN) 0.5 MG tablet Take 0.5 mg by mouth every 8 (eight) hours as needed for anxiety.    [provider]  meclizine (ANTIVERT) 25 MG tablet Take 25 mg by mouth at bedtime as needed for dizziness.    [provider]  nystatin (NYSTATIN) powder Apply 1 application topically 2 (two) times daily.    [provider]  ondansetron (ZOFRAN) 4 MG tablet Take 4  mg by mouth every 8 (eight) hours as needed for nausea or vomiting.    [provider]  OXYGEN Inhale 2 L into the lungs continuous. As directed.     [provider]  polyethylene glycol (MIRALAX / GLYCOLAX) packet Take 17 g by mouth daily as needed for mild constipation. Reported on 10/09/2015    [provider]  Propylene Glycol (SYSTANE BALANCE) 0.6 % SOLN Place 1 drop into both eyes 4 (four) times daily as needed (for dry eyes).     [provider]  repaglinide (PRANDIN) 0.5 MG tablet Take 1 tablet (0.5 mg total) by mouth 3 (three) times daily before meals. 01/14/16   Colon Branch, MD  rivaroxaban (XARELTO) 20 MG TABS tablet Take 1 tablet (20 mg total) by mouth daily with supper. 09/14/14   Colon Branch, MD  sertraline (ZOLOFT) 25 MG tablet Take 75 mg by mouth daily.     [provider]    Allergies    Penicillins, Codeine, and Sulfonamide derivatives  Review of Systems   Review of Systems  Constitutional: Positive for fatigue. Negative for chills, diaphoresis and fever.  HENT: Negative for congestion, rhinorrhea and sneezing.   Eyes: Negative.   Respiratory: Positive for cough. Negative for chest tightness and shortness of breath.   Cardiovascular: Negative for chest pain and leg swelling.  Gastrointestinal: Negative for abdominal pain, blood in stool, diarrhea, nausea and vomiting.  Genitourinary: Negative for difficulty urinating,  flank pain, frequency and hematuria.  Musculoskeletal: Negative for arthralgias and back pain.  Skin: Negative for rash.  Neurological: Positive for tremors and weakness. Negative for dizziness, speech difficulty, numbness and headaches.  Psychiatric/Behavioral: Positive for confusion.    Physical Exam Updated Vital Signs BP (!) 145/93 (BP Location: Right Arm)   Pulse (!) 112   Temp (!) 101.7 F (38.7 C) (Rectal)   Resp (!) 24   SpO2 100%   Physical Exam Constitutional:      Appearance: She is well-developed.  HENT:     Head: Normocephalic and atraumatic.  Eyes:     Pupils: Pupils are equal, round, and reactive to light.  Neck:     Comments: No meningismus Cardiovascular:     Rate and Rhythm: Regular rhythm. Tachycardia present.     Heart sounds: Normal heart sounds.  Pulmonary:     Effort: Pulmonary effort is normal. No respiratory distress.     Breath sounds: Normal breath sounds. No wheezing or rales.  Chest:     Chest wall: No tenderness.  Abdominal:     General: Bowel sounds are normal.     Palpations: Abdomen is soft.     Tenderness: There is no abdominal tenderness. There is no guarding or rebound.  Musculoskeletal:        General: Normal range of motion.     Cervical back: Normal range of motion and neck supple.  Lymphadenopathy:     Cervical: No cervical adenopathy.  Skin:    General: Skin is warm and dry.     Findings: No rash.  Neurological:     Mental Status: She is alert.     Comments: Patient is oriented to person and place but not year.  She does have a tremor in both of her upper extremities.  She does not have any obvious facial drooping.  Her speech seems a little slurred but per EMS, the nursing facility said this was baseline for her although it is a little more slow than normal.  She is able to name objects without difficulty.  She has good strength with no pronator drift in her upper extremities.  Symmetric bilaterally.  She has weakness to her  lower extremities.  She is unable to really lift either one of the stretcher more than about an inch.  It seems to be symmetric.  She has normal sensation to light touch in all extremities, no visual field deficits.     ED Results / Procedures / Treatments   Labs (all labs ordered are listed, but only abnormal results are displayed) Labs Reviewed  CBC WITH DIFFERENTIAL/PLATELET - Abnormal; Notable for the following components:      Result Value   RDW 17.1 (*)    Lymphs Abs 0.4 (*)    All other components within normal limits  COMPREHENSIVE METABOLIC PANEL - Abnormal; Notable for the following components:   Glucose, Bld 104 (*)    Creatinine, Ser 1.45 (*)    Calcium 8.6 (*)    Albumin 3.3 (*)    GFR calc non Af Amer 34 (*)    GFR calc Af Amer 39 (*)    All other components within normal limits  APTT - Abnormal; Notable for the following components:   aPTT 37 (*)    All other components within normal limits  PROTIME-INR - Abnormal; Notable for the following components:   Prothrombin Time 18.6 (*)    INR 1.6 (*)    All other components within normal limits  URINE CULTURE  CULTURE, BLOOD (ROUTINE X 2)  CULTURE, BLOOD (ROUTINE X 2)  SARS CORONAVIRUS 2 (TAT 6-24 HRS)  RESPIRATORY PANEL BY RT PCR (FLU A&B, COVID)  LACTIC ACID, PLASMA  URINALYSIS, ROUTINE W REFLEX MICROSCOPIC  LACTIC ACID, PLASMA  CBG MONITORING, ED  CBG MONITORING, ED    EKG EKG Interpretation  Date/Time:  Thursday December 14 2019 12:20:21 EDT Ventricular Rate:  120 PR Interval:    QRS Duration: 82 QT Interval:  336 QTC Calculation: 475 R Axis:   -35 Text Interpretation: Atrial fibrillation Left axis deviation Low voltage, precordial leads Anteroseptal infarct, old Minimal ST depression, diffuse leads since last tracing no significant change Confirmed by Malvin Johns [phone]) on 12/14/2019 1:54:47 PM   Radiology DG Chest 2 View  Result Date: 12/14/2019 CLINICAL DATA:  Altered mental status, shortness of  breath EXAM: CHEST - 2 VIEW COMPARISON:  10/01/2019 FINDINGS: Mild cardiomegaly. Atherosclerotic calcification of the aortic knob. Low lung volumes with crowding of the central bronchovascular markings. Interstitial prominence may reflect mild edema. No pneumothorax or pleural effusion is evident. IVC filter is noted. IMPRESSION: Mild cardiomegaly. Low lung volumes with crowding of the central bronchovascular markings and interstitial prominence may reflect mild edema. Electronically Signed   By: Davina Poke D.O.   On: 12/14/2019 12:49   CT Head Wo Contrast  Result Date: 12/14/2019 CLINICAL DATA:  Altered mental status EXAM: CT HEAD WITHOUT CONTRAST TECHNIQUE: Contiguous axial images were obtained from the base of the skull through the vertex without intravenous contrast. COMPARISON:  09/17/2017 FINDINGS: Brain: Chronic atrophic and ischemic changes are noted. No findings to suggest acute hemorrhage, acute infarction or space-occupying mass lesion are noted. Vascular: No hyperdense vessel or unexpected calcification. Skull: Normal. Negative for fracture or focal lesion. Sinuses/Orbits: No acute finding. Other: None. IMPRESSION: Chronic atrophic and ischemic changes similar to that seen on prior exam. No acute abnormality is noted. Electronically Signed   By: Inez Catalina M.D.   On: 12/14/2019 13:03    Procedures  Procedures (including critical care time)  Medications Ordered in ED Medications  aztreonam (AZACTAM) 2 g in sodium chloride 0.9 % 100 mL IVPB (2 g Intravenous New Bag/Given 12/14/19 1513)  metroNIDAZOLE (FLAGYL) IVPB 500 mg (has no administration in time range)  vancomycin (VANCOREADY) IVPB 2000 mg/400 mL (has no administration in time range)  vancomycin (VANCOREADY) IVPB 1250 mg/250 mL (has no administration in time range)  aztreonam (AZACTAM) 2 g in sodium chloride 0.9 % 100 mL IVPB (has no administration in time range)  acetaminophen (TYLENOL) tablet 1,000 mg (1,000 mg Oral Given  12/14/19 1441)  sodium chloride 0.9 % bolus 1,000 mL (1,000 mLs Intravenous New Bag/Given 12/14/19 1442)    ED Course  I have reviewed the triage vital signs and the nursing notes.  Pertinent labs & imaging results that were available during my care of the patient were reviewed by me and considered in my medical decision making (see chart for details).    MDM Rules/Calculators/A&P                      Patient is a 84 year old female who presents with confusion.  She does not have any focal neurologic deficits.  She was noted to be tachycardic on arrival as well as tachypneic.  During her evaluation, she was noted to have a rectal temp of 101.7.  Following this, she was treated for possible sepsis with IV fluids and IV antibiotics.  Her CT head shows no acute abnormality, intracranial hemorrhage or acute obvious stroke.  Chest x-ray shows no evidence of pneumonia.  Her WBC count is normal.  Her other labs are nonconcerning.  Urinalysis is pending.  Covid is pending although she has previously been treated for Covid.  She does not have any evidence of cellulitis.  There is no abdominal tenderness.  She does not have any meningeal signs.  Plan is admission to the hospitalist service.  I spoke with the hospitalist who will admit the pt.  He requests the Covid/Flu swab.  Dawn Morrow was evaluated in Emergency Department on 12/14/2019 for the symptoms described in the history of present illness. She was evaluated in the context of the global COVID-19 pandemic, which necessitated consideration that the patient might be at risk for infection with the SARS-CoV-2 virus that causes COVID-19. Institutional protocols and algorithms that pertain to the evaluation of patients at risk for COVID-19 are in a state of rapid change based on information released by regulatory bodies including the CDC and federal and state organizations. These policies and algorithms were followed during the patient's care in the  ED.   CRITICAL CARE Performed by: Malvin Johns Total critical care time: 60 minutes Critical care time was exclusive of separately billable procedures and treating other patients. Critical care was necessary to treat or prevent imminent or life-threatening deterioration. Critical care was time spent personally by me on the following activities: development of treatment plan with patient and/or surrogate as well as nursing, discussions with consultants, evaluation of patient's response to treatment, examination of patient, obtaining history from patient or surrogate, ordering and performing treatments and interventions, ordering and review of laboratory studies, ordering and review of radiographic studies, pulse oximetry and re-evaluation of patient's condition.  Final Clinical Impression(s) / ED Diagnoses Final diagnoses:  Altered mental status, unspecified altered mental status type  Sepsis without acute organ dysfunction, due to unspecified organism [facility])    Rx / DC Orders ED Discharge Orders    None  Malvin Johns, MD 12/14/19 1517

## 2019-12-14 NOTE — ED Notes (Signed)
Sherri niece [phone] looking for an update on the pt

## 2019-12-14 NOTE — ED Triage Notes (Signed)
Pt bib [facility] from [facility] w/ c/o AMS that started this morning. Per EMS pt has had increased generalized weakness and confusion. Pt AOx4 at baseline, AOx2 w/ EMS. EMS VSS.

## 2019-12-14 NOTE — Progress Notes (Signed)
New Admission Note:  Arrival Method: via stretcher from [facility] ED Mental Orientation: Oriented to self, place and time, disoriented to situation. Telemetry: Yes on [address] 32 reading NSR Assessment: Completed Skin: skin is dry and intact. Abrasion on left lower extremity IV: Left AC infusing NS @ 50 mL/hr Pain: No complaints of pain Safety Measures: Safety Fall Prevention Plan was given, discussed. Admission: Completed 3W: Patient has been orientated to the room, unit and the staff. Family: pt has neice sitting at bedside that is pleasant and supportive.  Orders have been reviewed and implemented. Will continue to monitor the patient. Call light has been placed within reach and bed alarm has been activated.  Janus Molder, RN

## 2019-12-14 NOTE — ED Notes (Signed)
Pt. Family updated on care/progress

## 2019-12-14 NOTE — H&P (Signed)
History and Physical    Dawn Morrow [id number] DOB: Sep 22, 1935 DOA: 12/14/2019  PCP: Colon Branch, MD   Patient coming from: [facility]  Chief Complaint: confusion  HPI: Dawn Morrow is a 84 y.o. female with medical history significant for history of recurrent PE/DVT on chronic anticoagulation with Xarelto, hypertension,hyperlipidemia,COPD./on home oxygen 2 L CKD stage IIIa, hyperlipidemia, T2DM, history of COVID 09/08/19,history of mini stroke,baseline tremor sent from [place] [facility] due to confusion generalized weakness.  Patient reportedly was having confusion this morning, was shaky and dropping her breakfast and unsteady on her feet and had difficulty walking due to weakness.  At baseline he is alert oriented x4.  Patient is unable to provide history and it is obtained from discussion the ER physician and the chart review. She denies any nausea vomiting abdominal pain, chest pain, shortness of breath.  Denies numbness tingling.  ED Course: Blood Pressure stable in 140s, tachycardic 110s, temperature one 1.7 rectally, saturating well, BMP with creatinine 1.4, CBC stable, lactic acid 1.9, CXR low lung volume, crowding of the central bronchovascular markings and interstitial prominence may reflect mild edema. CT Head with chronic atrophic changes like before. In the ED patient had tremors in her both upper extremities without facial drooping, oriented to person place but not to year, had good strength with no pronator drift in upper extremities no sensory deficit.  Patient was suspected to have sepsis unclear etiology no evidence of cellulitis or pneumonia and no neck stiffness.Due to fever sicne no cause found requested Covid screen along with influenza, She is being admitted after starting IV fluids and broad-spectrum antibiotics with aztreonam/Flagyl/vancomycin due to penicillin allergy. Saw the patient she is somewhat sleepy saturating 100% ~nasal cannula, vitals are  stable.  She is able to wake up and tell me her name and that she is in the hospital and grossly able to move all her extremities.  Review of Systems: Unable to obtain full review of system due to her confusion. See hpi.   Past Medical History:  Diagnosis Date  . Anxiety and depression   . Asthma   . Cataract   . COPD (chronic obstructive pulmonary disease) ([place])   . Diabetes mellitus   . Diverticulosis   . DVT (deep vein thrombosis) in pregnancy    hx x multiple on coumadin  . GERD (gastroesophageal reflux disease)   . Hemorrhoids   . Hiatal hernia   . Hyperlipidemia   . Hypertension   . Hypothyroidism   . On home oxygen therapy    "2L; 24/7" (09/17/2017)  . Osteoarthritis    h/o spinal stenosis by MRI 2009  . Osteopenia   . Pulmonary embolism (HCC)    x 2  . Stroke [facility]    MINI  . Tubular adenoma of colon 07/2005    Past Surgical History:  Procedure Laterality Date  . APPENDECTOMY    . CATARACT EXTRACTION    . CHOLECYSTECTOMY    . LUMBAR FUSION    . POLYPECTOMY    . TONSILLECTOMY    . TUBAL LIGATION       reports that she has quit smoking. Her smoking use included cigarettes. She has never used smokeless tobacco. She reports that she does not drink alcohol or use drugs.  Allergies  Allergen Reactions  . Penicillins Anaphylaxis  . Codeine Other (See Comments)    unknown  . Sulfonamide Derivatives Other (See Comments)    unknown    Family History  Adopted:  Yes  Problem Relation Age of Onset  . Cancer Mother        ? colon or ovarian  . Diabetes Mother   . Cancer Brother        ?  . CAD Neg Hx      Prior to Admission medications   Medication Sig Start Date End Date Taking? Authorizing Provider  acetaminophen (TYLENOL) 500 MG tablet Take 500 mg by mouth 3 (three) times daily.     [provider]  albuterol (PROVENTIL HFA;VENTOLIN HFA) 108 (90 BASE) MCG/ACT inhaler Inhale 2 puffs into the lungs every 6 (six) hours as needed for wheezing or  shortness of breath. 08/05/15   Saguier, Percell Miller, PA-C  aspirin-acetaminophen-caffeine (EXCEDRIN MIGRAINE) [phone] MG per tablet Take 2 tablets by mouth every 6 (six) hours as needed for headache (headache). Reported on 10/09/2015    [provider]  atorvastatin (LIPITOR) 10 MG tablet Take 1 tablet (10 mg total) by mouth at bedtime. 10/21/15   Colon Branch, MD  buPROPion (WELLBUTRIN XL) 150 MG 24 hr tablet Take 150 mg by mouth daily. Take with wellbutrin xl 300 mg to equal 450 mg    [provider]  buPROPion (WELLBUTRIN XL) 300 MG 24 hr tablet Take 300 mg by mouth daily. Take with wellbutrin xl 150 mg to equal 450 mg    [provider]  carvedilol (COREG) 3.125 MG tablet Take 1 tablet (3.125 mg total) by mouth 2 (two) times daily with a meal. 09/20/17   Mariel Aloe, MD  docusate sodium (COLACE) 100 MG capsule Take 100 mg by mouth daily.    [provider]  ezetimibe (ZETIA) 10 MG tablet Take 10 mg by mouth daily.    [provider]  gabapentin (NEURONTIN) 300 MG capsule Take 1 cap in AM, 1 cap at noon, 2 caps at bedtime Patient taking differently: Take 300-600 mg by mouth 3 (three) times daily. Take 1 capsule every morning and afternoon then take 2 capsules at bedtime. 12/21/14   Cameron Sprang, MD  ketoconazole (NIZORAL) 2 % shampoo Apply 1 application topically 2 (two) times a week.    [provider]  levothyroxine (SYNTHROID) 88 MCG tablet Take 88 mcg by mouth daily before breakfast.    [provider]  linagliptin (TRADJENTA) 5 MG TABS tablet Take 1 tablet (5 mg total) by mouth at bedtime. 07/15/16   Colon Branch, MD  LORazepam (ATIVAN) 0.5 MG tablet Take 0.5 mg by mouth every 8 (eight) hours as needed for anxiety.    [provider]  meclizine (ANTIVERT) 25 MG tablet Take 25 mg by mouth at bedtime as needed for dizziness.    [provider]  nystatin (NYSTATIN) powder Apply 1 application topically 2 (two) times  daily.    [provider]  ondansetron (ZOFRAN) 4 MG tablet Take 4 mg by mouth every 8 (eight) hours as needed for nausea or vomiting.    [provider]  OXYGEN Inhale 2 L into the lungs continuous. As directed.     [provider]  polyethylene glycol (MIRALAX / GLYCOLAX) packet Take 17 g by mouth daily as needed for mild constipation. Reported on 10/09/2015    [provider]  Propylene Glycol (SYSTANE BALANCE) 0.6 % SOLN Place 1 drop into both eyes 4 (four) times daily as needed (for dry eyes).     [provider]  repaglinide (PRANDIN) 0.5 MG tablet Take 1 tablet (0.5 mg total) by  mouth 3 (three) times daily before meals. 01/14/16   Colon Branch, MD  rivaroxaban (XARELTO) 20 MG TABS tablet Take 1 tablet (20 mg total) by mouth daily with supper. 09/14/14   Colon Branch, MD  sertraline (ZOLOFT) 25 MG tablet Take 75 mg by mouth daily.     [provider]    Physical Exam: Vitals:   12-31-2019 1217 12-31-2019 1218 12-31-2019 1317  BP:  (!) 143/91 (!) 145/93  Pulse: (!) 119  (!) 112  Resp: 18 (!) 23 (!) 24  Temp: 98.3 F (36.8 C)  (!) 101.7 F (38.7 C)  TempSrc: Oral  Rectal  SpO2: 94%  100%   General exam: Lethargic, able to wake up and interact some, follows some commands not in acute distress, obese, on [place] HEENT:Oral mucosa moist, Ear/Nose WNL grossly, dentition normal. Respiratory system: bilaterally diminished breath sound,no wheezing or crackles,no use of accessory muscle Cardiovascular system: S1 & S2 +, No JVD,. Gastrointestinal system: Abdomen soft, NT,ND, BS+ Nervous System: Able to squeeze well with upper extremities and moving, grossly nonfocal, generalized weakness noted in the bilateral lower extremities.   Extremities: No edema, distal peripheral pulses palpable.  Skin: No rashes,no icterus. MSK: Normal muscle bulk,tone, power  Labs on Admission: I have personally reviewed following labs and imaging studies  CBC: Recent Labs    Lab 2019/12/31 1214  WBC 5.7  NEUTROABS 4.9  HGB 12.4  HCT 40.8  MCV 99.3  PLT [id number]   Basic Metabolic Panel: Recent Labs  Lab 31-Dec-2019 1214  NA 141  K 4.4  CL 104  CO2 27  GLUCOSE 104*  BUN 18  CREATININE 1.45*  CALCIUM 8.6*   GFR: CrCl cannot be calculated (Unknown ideal weight.). Liver Function Tests: Recent Labs  Lab 12-31-19 1214  AST 20  ALT 15  ALKPHOS 71  BILITOT 0.6  PROT 6.8  ALBUMIN 3.3*   No results for input(s): LIPASE, AMYLASE in the last 168 hours. No results for input(s): AMMONIA in the last 168 hours. Coagulation Profile: Recent Labs  Lab December 31, 2019 1419  INR 1.6*   Cardiac Enzymes: No results for input(s): CKTOTAL, CKMB, CKMBINDEX, TROPONINI in the last 168 hours. BNP (last 3 results) No results for input(s): PROBNP in the last 8760 hours. HbA1C: No results for input(s): HGBA1C in the last 72 hours. CBG: Recent Labs  Lab 31-Dec-2019 1304  GLUCAP 78   Lipid Profile: No results for input(s): CHOL, HDL, LDLCALC, TRIG, CHOLHDL, LDLDIRECT in the last 72 hours. Thyroid Function Tests: No results for input(s): TSH, T4TOTAL, FREET4, T3FREE, THYROIDAB in the last 72 hours. Anemia Panel: No results for input(s): VITAMINB12, FOLATE, FERRITIN, TIBC, IRON, RETICCTPCT in the last 72 hours. Urine analysis:    Component Value Date/Time   COLORURINE AMBER (A) 10/01/2019 1810   APPEARANCEUR CLOUDY (A) 10/01/2019 1810   LABSPEC 1.023 10/01/2019 1810   PHURINE 5.0 10/01/2019 1810   GLUCOSEU NEGATIVE 10/01/2019 1810   GLUCOSEU NEGATIVE 06/12/2014 1410   HGBUR SMALL (A) 10/01/2019 1810   BILIRUBINUR NEGATIVE 10/01/2019 1810   BILIRUBINUR Neg 06/19/2016 1101   KETONESUR 5 (A) 10/01/2019 1810   PROTEINUR 30 (A) 10/01/2019 1810   UROBILINOGEN >=8.0 06/19/2016 1101   UROBILINOGEN 0.2 05/31/2015 1835   NITRITE NEGATIVE 10/01/2019 1810   LEUKOCYTESUR LARGE (A) 10/01/2019 1810    Radiological Exams on Admission: DG Chest 2 View  Result Date:  Dec 31, 2019 CLINICAL DATA:  Altered mental status, shortness of breath EXAM: CHEST - 2 VIEW COMPARISON:  10/01/2019 FINDINGS: Mild cardiomegaly. Atherosclerotic calcification of the aortic knob. Low lung volumes with crowding of the central bronchovascular markings. Interstitial prominence may reflect mild edema. No pneumothorax or pleural effusion is evident. IVC filter is noted. IMPRESSION: Mild cardiomegaly. Low lung volumes with crowding of the central bronchovascular markings and interstitial prominence may reflect mild edema. Electronically Signed   By: Davina Poke D.O.   On: 12/14/2019 12:49   CT Head Wo Contrast  Result Date: 12/14/2019 CLINICAL DATA:  Altered mental status EXAM: CT HEAD WITHOUT CONTRAST TECHNIQUE: Contiguous axial images were obtained from the base of the skull through the vertex without intravenous contrast. COMPARISON:  09/17/2017 FINDINGS: Brain: Chronic atrophic and ischemic changes are noted. No findings to suggest acute hemorrhage, acute infarction or space-occupying mass lesion are noted. Vascular: No hyperdense vessel or unexpected calcification. Skull: Normal. Negative for fracture or focal lesion. Sinuses/Orbits: No acute finding. Other: None. IMPRESSION: Chronic atrophic and ischemic changes similar to that seen on prior exam. No acute abnormality is noted. Electronically Signed   By: Inez Catalina M.D.   On: 12/14/2019 13:03     Assessment/Plan  Fever with confusion, r/o sepsis: Agree with this admission broad-spectrum antibiotics,has penicillin allergy continue Vanco aztreonam Flagyl till blood cultures are back, chest x-ray unremarkable. ua came back nitrite positive, leukocyte esterase small, WBC 11-20, possible UTI. She Will f/u blood/urien culture and deescalate antibiotic hopefully in next 24 hours.   COVID-19/influenza screen pending.  Continue home Ativan as needed, Zoloft, Neurontin at slightly lower dose and uptitrate to home dose slowly.  Acute metabolic  encephalopathy patient confused since this morning, nonfocal on exam CTh unremarkable.  Suspecting due to #1. CHECK  B12 ammonia level.  Recurrent PE/DVT on chronic anticoagulation with Xarelto-cont the same.  Hypertension: Continue Coreg   COPD./on home oxygen 2 L: Continue bronchodilators supplemental oxygen as needed.   CKD stage IIIa: Creatinine more or less at baseline. Cont gentle ivf for now.   T2DM- hold OHA, add ssi and monitor accucheck. Last hba1c at 6.6  history of COVID 09/08/19, previously treated  History of atrial fibrillation- chronic/ history of TIA/baseline tremor: Resume home Xarelto, Coreg along with her Ativan, Zoloft.   Hypothyroidism continue home Synthroid.Check TSH.  GERD-cont home meds.  Goal of care discussed and patient is currently full code encouraged patient's niece to discuss with patient she is able to make decision.  Severity of Illness: * I certify that at the point of admission it is my clinical judgment that the patient will require inpatient hospital care spanning beyond 2 midnights from the point of admission due to high intensity of service, high risk for further deterioration and high frequency of surveillance required due to patient's acute metabolic encephalopathy fever, unknown source.   DVT prophylaxis:Xarelto Code Status:Full code Family Communication: Admission, patients condition and plan of care including tests being ordered have been discussed with the patient;s contact Sherri who indicate understanding and agree with the plan and Code Status. I did explain her that due to her old age and multiple comorbidities, and prognosis needs to be seen how she does  Consults called: none  Antonieta Pert MD [facility] Pager [phone]  If 7PM-7AM, please contact night-coverage [email or web address]  12/14/2019, 3:24 PM

## 2019-12-15 LAB — CBC
HCT: 38.6 % (ref 36.0–46.0)
Hemoglobin: 11.8 g/dL — ABNORMAL LOW (ref 12.0–15.0)
MCH: 29.8 pg (ref 26.0–34.0)
MCHC: 30.6 g/dL (ref 30.0–36.0)
MCV: 97.5 fL (ref 80.0–100.0)
Platelets: 141 10*3/uL — ABNORMAL LOW (ref 150–400)
RBC: 3.96 MIL/uL (ref 3.87–5.11)
RDW: 16.9 % — ABNORMAL HIGH (ref 11.5–15.5)
WBC: 4.4 10*3/uL (ref 4.0–10.5)
nRBC: 0 % (ref 0.0–0.2)

## 2019-12-15 LAB — BASIC METABOLIC PANEL
Anion gap: 9 (ref 5–15)
BUN: 18 mg/dL (ref 8–23)
CO2: 26 mmol/L (ref 22–32)
Calcium: 8.3 mg/dL — ABNORMAL LOW (ref 8.9–10.3)
Chloride: 104 mmol/L (ref 98–111)
Creatinine, Ser: 1.21 mg/dL — ABNORMAL HIGH (ref 0.44–1.00)
GFR calc Af Amer: 49 mL/min — ABNORMAL LOW (ref >60)
GFR calc non Af Amer: 42 mL/min — ABNORMAL LOW (ref >60)
Glucose, Bld: 142 mg/dL — ABNORMAL HIGH (ref 70–99)
Potassium: 4.2 mmol/L (ref 3.5–5.1)
Sodium: 139 mmol/L (ref 135–145)

## 2019-12-15 LAB — GLUCOSE, CAPILLARY
Glucose-Capillary: 98 mg/dL (ref 70–99)
Glucose-Capillary: 93 mg/dL (ref 70–99)
Glucose-Capillary: 116 mg/dL — ABNORMAL HIGH (ref 70–99)
Glucose-Capillary: 124 mg/dL — ABNORMAL HIGH (ref 70–99)

## 2019-12-15 LAB — HEMOGLOBIN A1C
Hgb A1c MFr Bld: 6.1 % — ABNORMAL HIGH (ref 4.8–5.6)
Mean Plasma Glucose: 128 mg/dL

## 2019-12-15 LAB — AMMONIA: Ammonia: 29 umol/L (ref 9–35)

## 2019-12-15 LAB — VITAMIN B12: Vitamin B-12: 186 pg/mL (ref 180–914)

## 2019-12-15 LAB — TSH: TSH: 2.492 u[IU]/mL (ref 0.350–4.500)

## 2019-12-15 MED ORDER — LEVOFLOXACIN 250 MG PO TABS
250.0000 mg | ORAL_TABLET | Freq: Every day | ORAL | Status: AC
  Administered 2019-12-15 – 2019-12-17 (×3): 250 mg via ORAL
  Filled 2019-12-15 (×3): qty 1

## 2019-12-15 MED ORDER — ALTEPLASE 2 MG IJ SOLR: 2.0000 mg | Freq: Once | INTRAMUSCULAR | Status: DC

## 2019-12-15 MED ORDER — VANCOMYCIN HCL 750 MG/150ML IV SOLN: 750.0000 mg | INTRAVENOUS | Status: DC

## 2019-12-15 MED ORDER — METOPROLOL TARTRATE 5 MG/5ML IV SOLN
5.0000 mg | INTRAVENOUS | Status: AC | PRN
  Administered 2019-12-15 (×2): 5 mg via INTRAVENOUS
  Filled 2019-12-15 (×2): qty 5

## 2019-12-15 NOTE — Discharge Instructions (Signed)

## 2019-12-15 NOTE — Progress Notes (Signed)
PROGRESS NOTE    Dawn Morrow  YEB:[id number] DOB: [birth year] DOA: 12/14/2019 PCP: Colon Branch, MD   Brief Narrative: 84 y.o. 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  Patient reportedly was having confusion this morning, was shaky and dropping her breakfast and unsteady on her feet and had difficulty walking due to weakness.  At baseline he is alert oriented x4.Patient is unable to provide history and it is obtained from discussion the ER physician and the chart review. She denies any nausea vomiting abdominal pain, chest pain, shortness of breath.  Denies numbness tingling.  ED Course: Blood Pressure stable in 140s, tachycardic 110s, temperature one 1.7 rectally, saturating well, BMP with creatinine 1.4, CBC stable, lactic acid 1.9, CXR low lung volume, crowding of the central bronchovascular markings and interstitial prominence may reflect mild edema. CT Head with chronic atrophic changes like before. In the ED patient had tremors in her both upper extremities without facial drooping, oriented to person place but not to year, had good strength with no pronator drift in upper extremities no sensory deficit.  Patient was suspected to have sepsis unclear etiology no evidence of cellulitis or pneumonia and no neck stiffness.Due to fever sicne no cause found requested Covid screen along with influenza, She is being admitted after starting IV fluids and broad-spectrum antibiotics with aztreonam/Flagyl/vancomycin due to penicillin allergy  Subjective: Seen this morning she is alert awake communicative interactive, hard of hearing Able to tell me her name that she is in the hospital able to tell the  correct year but not month or day Overnight, blood pressure stable Lab with improving creatinine  Assessment & Plan:  Fever with confusion, r/o sepsis: UA came back nitrite positive, leukocyte esterase small, WBC 11-20, possible UTI. CXR unremarkable. COVID-19/FLU A/B negative.  Blood and urine culture pending.  Pharmacy dosing vancomycin/aztreonam/Flagyl.  Patient had a history of anaphylaxis to penicillin. Last urine culture showed  Pansensitive E COLI:09/28/19: will switch her to Levaquin-discussed with pharmacy, follow-up cultures closely.  To quickly de-escalate antibiotics to single agent later today.Continue home Ativan as needed, Zoloft, Neurontin at slightly lower dose and uptitrate to home dose slowly.  Acute metabolic encephalopathy patient confused since 4/8 morning, nonfocal on exam CT head unremarkable.  Suspecting due to #1. stable  B12,tsh, ammonia level.  Overall mentation much improved.  Supportive care and PT eval today.    Recurrent PE/DVT on chronic anticoagulation with Xarelto-cont the same.  Hypertension: stable, continue Coreg   COPD./on home oxygen 2 L: Continue bronchodilators supplemental oxygen as needed.   CKD stage IIIa: Creatinine more or less at baseline. Cont gentle ivf for now. Recent Labs  Lab 12/14/19 1214 12/15/19 0329  BUN 18 18  CREATININE 1.45* 1.21*    T2DM- hold OHA, Cont ssi and monitor accucheck. Last hba1c at 6.6 Recent Labs  Lab 12/14/19 1304 12/14/19 1652 12/14/19 2148 12/15/19 0630  GLUCAP 78 86 139* 98   history of COVID 09/08/19, previously treated  History of atrial fibrillation- chronic/ history of TIA/baseline tremor: Cont Xarelto, Coreg.  Hypothyroidism continue home Synthroid.Check TSH.  GERD- Cont home meds.  Goal of care discussed and patient is currently full code encouraged patient's niece to discuss with patient she is able to make decision  DVT prophylaxis:xarelto Code Status: full  Family Communication:  plan of care discussed with patient at bedside, niece was updated on admission.  Disposition Plan: Patient is from: ALF Anticipated Disposition: to TBD, pt eval pending, may need SNF. Barriers to discharge or conditions that needs to be met prior to discharge: Patient admitted with altered mental status/diabetic, possible sepsis UTI, work-up and culture in process and remains hospitalized. PT OT eval for disposition  Consultants:see note  Procedures:see note Microbiology:see note  Nutrition: Diet Order            DIET DYS 3 Room service appropriate? Yes; Fluid consistency: Thin  Diet effective now              Body mass index is 35.95 kg/m.  Medications: Scheduled Meds:  atorvastatin  10 mg Oral QHS   buPROPion  450 mg Oral QHS   carvedilol  3.125 mg Oral BID WC   Chlorhexidine Gluconate Cloth  6 each Topical Daily   ezetimibe  10 mg Oral Daily   gabapentin  200 mg Oral BID   insulin aspart  0-5 Units Subcutaneous QHS   insulin aspart  0-9 Units Subcutaneous TID WC   levothyroxine  88 mcg Oral QAC breakfast   nystatin  1 application Topical BID   rivaroxaban  20 mg Oral Q supper   sertraline  75 mg Oral Q1200   Continuous Infusions:  sodium chloride 50 mL/hr at 12/14/19 1938   aztreonam 2 g (12/15/19 0524)   metronidazole 500 mg (12/15/19 0558)   [START ON 12/16/2019] vancomycin      Antimicrobials: Anti-infectives (From admission, onward)   Start     Dose/Rate Route Frequency Ordered Stop   12/16/19 1500  vancomycin (VANCOREADY) IVPB 1250 mg/250 mL     1,250 mg 166.7 mL/hr over 90 Minutes Intravenous Every 48 hours 12/14/19 1458     12/14/19 2200  aztreonam (AZACTAM) 2 g in sodium chloride 0.9 % 100 mL IVPB     2 g 200 mL/hr over 30 Minutes Intravenous Every 8 hours 12/14/19 1458     12/14/19 2200  metroNIDAZOLE (FLAGYL) IVPB 500 mg     500 mg 100 mL/hr over 60 Minutes Intravenous Every 8 hours 12/14/19 1532     12/14/19 1400  aztreonam  (AZACTAM) 2 g in sodium chloride 0.9 % 100 mL IVPB     2 g 200 mL/hr over 30 Minutes Intravenous  Once 12/14/19 1349 12/14/19 1624   12/14/19 1400  metroNIDAZOLE (FLAGYL) IVPB 500 mg     500 mg 100 mL/hr over 60 Minutes Intravenous  Once 12/14/19 1349 12/14/19 1625   12/14/19 1400  vancomycin (VANCOCIN) IVPB 1000 mg/200 mL premix  Status:  Discontinued     1,000 mg 200 mL/hr over 60 Minutes Intravenous  Once 12/14/19 1349 12/14/19 1354   12/14/19 1400  vancomycin (VANCOREADY) IVPB 2000 mg/400 mL     2,000 mg 200 mL/hr over 120 Minutes Intravenous  Once 12/14/19 1354 12/14/19 1938     Objective: Vitals: Today's Vitals   12/15/19 0201 12/15/19 0203 12/15/19 0349 12/15/19 0349  BP:   130/74   Pulse: (!) 112 (!) 107 62 81  Resp:   18   Temp:   98.2 F (36.8 C)   TempSrc:   Oral   SpO2:   94% 95%  Weight:      Height:      PainSc:        Intake/Output Summary (Last 24 hours) at 12/15/2019 0758 Last data filed at 12/15/2019  0559 Gross per 24 hour  Intake 1800 ml  Output 850 ml  Net 950 ml   Filed Weights   12/14/19 1534 12/14/19 1832  Weight: 95 kg 95 kg   Weight change:    Intake/Output from previous day: 04/08 0701 - 04/09 0700 In: 1800 [I.V.:400; IV Piggyback:1400] Out: 850 [Urine:850] Intake/Output this shift: No intake/output data recorded.  Examination:  General exam: AAOx2-3 ,NAD, weak appearing. HEENT:Oral mucosa moist, Ear/Nose WNL grossly,dentition normal. Respiratory system: bilaterally clear,no wheezing or crackles,no use of accessory muscle, non tender. Cardiovascular system: S1 & S2 +, regular, No JVD. Gastrointestinal system: Abdomen soft, NT,ND, BS+. Nervous System:Alert, awake, moving extremities and grossly nonfocal Extremities: No edema, distal peripheral pulses palpable.  Skin: No rashes,no icterus. MSK: Normal muscle bulk,tone, power  Data Reviewed: I have personally reviewed following labs and imaging studies CBC: Recent Labs  Lab  12/14/19 1214 12/15/19 0329  WBC 5.7 4.4  NEUTROABS 4.9  --   HGB 12.4 11.8*  HCT 40.8 38.6  MCV 99.3 97.5  PLT 169 814*   Basic Metabolic Panel: Recent Labs  Lab 12/14/19 1214 12/15/19 0329  NA 141 139  K 4.4 4.2  CL 104 104  CO2 27 26  GLUCOSE 104* 142*  BUN 18 18  CREATININE 1.45* 1.21*  CALCIUM 8.6* 8.3*   GFR: Estimated Creatinine Clearance: 40.8 mL/min (A) (by C-G formula based on SCr of 1.21 mg/dL (H)). Liver Function Tests: Recent Labs  Lab 12/14/19 1214  AST 20  ALT 15  ALKPHOS 71  BILITOT 0.6  PROT 6.8  ALBUMIN 3.3*   No results for input(s): LIPASE, AMYLASE in the last 168 hours. Recent Labs  Lab 12/15/19 0329  AMMONIA 29   Coagulation Profile: Recent Labs  Lab 12/14/19 1419  INR 1.6*   Cardiac Enzymes: No results for input(s): CKTOTAL, CKMB, CKMBINDEX, TROPONINI in the last 168 hours. BNP (last 3 results) No results for input(s): PROBNP in the last 8760 hours. HbA1C: Recent Labs    12/14/19 1214  HGBA1C 6.1*   CBG: Recent Labs  Lab 12/14/19 1304 12/14/19 1652 12/14/19 2148 12/15/19 0630  GLUCAP 78 86 139* 98   Lipid Profile: No results for input(s): CHOL, HDL, LDLCALC, TRIG, CHOLHDL, LDLDIRECT in the last 72 hours. Thyroid Function Tests: Recent Labs    12/15/19 0329  TSH 2.492   Anemia Panel: Recent Labs    12/15/19 0329  VITAMINB12 186   Sepsis Labs: Recent Labs  Lab 12/14/19 1351 12/14/19 1530  LATICACIDVEN 1.9 1.5    Recent Results (from the past 240 hour(s))  Respiratory Panel by RT PCR (Flu A&B, Covid) - Nasopharyngeal Swab     Status: None   Collection Time: 12/14/19  3:33 PM   Specimen: Nasopharyngeal Swab  Result Value Ref Range Status   SARS Coronavirus 2 by RT PCR NEGATIVE NEGATIVE Final    Comment: (NOTE) SARS-CoV-2 target nucleic acids are NOT DETECTED. The SARS-CoV-2 RNA is generally detectable in upper respiratoy specimens during the acute phase of infection. The lowest concentration of  SARS-CoV-2 viral copies this assay can detect is 131 copies/mL. A negative result does not preclude SARS-Cov-2 infection and should not be used as the sole basis for treatment or other patient management decisions. A negative result may occur with  improper specimen collection/handling, submission of specimen other than nasopharyngeal swab, presence of viral mutation(s) within the areas targeted by this assay, and inadequate number of viral copies (<131 copies/mL). A negative result must be combined with  clinical observations, patient history, and epidemiological information. The expected result is Negative. Fact Sheet for Patients:  PinkCheek.be Fact Sheet for Healthcare Providers:  GravelBags.it This test is not yet ap proved or cleared by the [place] FDA and  has been authorized for detection and/or diagnosis of SARS-CoV-2 by FDA under an Emergency Use Authorization (EUA). This EUA will remain  in effect (meaning this test can be used) for the duration of the COVID-19 declaration under Section 564(b)(1) of the Act, 21 U.S.C. section 360bbb-3(b)(1), unless the authorization is terminated or revoked sooner.    Influenza A by PCR NEGATIVE NEGATIVE Final   Influenza B by PCR NEGATIVE NEGATIVE Final    Comment: (NOTE) The Xpert Xpress SARS-CoV-2/FLU/RSV assay is intended as an aid in  the diagnosis of influenza from Nasopharyngeal swab specimens and  should not be used as a sole basis for treatment. Nasal washings and  aspirates are unacceptable for Xpert Xpress SARS-CoV-2/FLU/RSV  testing. Fact Sheet for Patients: PinkCheek.be Fact Sheet for Healthcare Providers: GravelBags.it This test is not yet approved or cleared by the [place] FDA and  has been authorized for detection and/or diagnosis of SARS-CoV-2 by  FDA under an Emergency Use Authorization (EUA).  This EUA will remain  in effect (meaning this test can be used) for the duration of the  Covid-19 declaration under Section 564(b)(1) of the Act, 21  U.S.C. section 360bbb-3(b)(1), unless the authorization is  terminated or revoked. Performed at [facility], [place] [address], [place], [place] [address]       Radiology Studies: DG Chest 2 View  Result Date: 12/14/2019 CLINICAL DATA:  Altered mental status, shortness of breath EXAM: CHEST - 2 VIEW COMPARISON:  10/01/2019 FINDINGS: Mild cardiomegaly. Atherosclerotic calcification of the aortic knob. Low lung volumes with crowding of the central bronchovascular markings. Interstitial prominence may reflect mild edema. No pneumothorax or pleural effusion is evident. IVC filter is noted. IMPRESSION: Mild cardiomegaly. Low lung volumes with crowding of the central bronchovascular markings and interstitial prominence may reflect mild edema. Electronically Signed   By: Davina Poke D.O.   On: 12/14/2019 12:49   CT Head Wo Contrast  Result Date: 12/14/2019 CLINICAL DATA:  Altered mental status EXAM: CT HEAD WITHOUT CONTRAST TECHNIQUE: Contiguous axial images were obtained from the base of the skull through the vertex without intravenous contrast. COMPARISON:  09/17/2017 FINDINGS: Brain: Chronic atrophic and ischemic changes are noted. No findings to suggest acute hemorrhage, acute infarction or space-occupying mass lesion are noted. Vascular: No hyperdense vessel or unexpected calcification. Skull: Normal. Negative for fracture or focal lesion. Sinuses/Orbits: No acute finding. Other: None. IMPRESSION: Chronic atrophic and ischemic changes similar to that seen on prior exam. No acute abnormality is noted. Electronically Signed   By: Inez Catalina M.D.   On: 12/14/2019 13:03     LOS: 1 day   Time spent: More than 50% of that time was spent in counseling and/or coordination of care.  Antonieta Pert, MD [facility]  12/15/2019, 7:58 AM

## 2019-12-15 NOTE — Progress Notes (Signed)
Pharmacy Antibiotic Note  Dawn Morrow is a 84 y.o. female admitted on 12/14/2019 with r/o sepsis.  Pharmacy has been consulted for Vanco/Aztreonam dosing.  ID: abx for sepsis? Tmax 101.7, lactate 1.5 down, WBC WNL  Vancomycin 4/8 >>  Aztreonam 4/8 >>  Flagyl 4/8 x1   4/8 BCx:  4/8 UCx:  4/9 Vancomycin 750 mg IV Q 24 hrs. Goal AUC 400-550. Expected AUC: 517.1, SCr used: 1.21, VD 0.5  Plan: Aztreonam 2g IV Q8 hrs Change Vancomycin to 750mg  IV q24h Xarelto 20mg /d   Height: 5\' 4"  (162.6 cm) Weight: 95 kg (209 lb 7 oz) IBW/kg (Calculated) : 54.7  Temp (24hrs), Avg:98.6 F (37 C), Min:97.5 F (36.4 C), Max:101.7 F (38.7 C)  Recent Labs  Lab 12/14/19 1214 12/14/19 1351 12/14/19 1530 12/15/19 0329  WBC 5.7  --   --  4.4  CREATININE 1.45*  --   --  1.21*  LATICACIDVEN  --  1.9 1.5  --     Estimated Creatinine Clearance: 40.8 mL/min (A) (by C-G formula based on SCr of 1.21 mg/dL (H)).    Allergies  Allergen Reactions  . Penicillins Anaphylaxis  . Codeine Other (See Comments)    unknown  . Sulfonamide Derivatives Other (See Comments)    unknown     Toddrick Sanna S. Alford Highland, PharmD, BCPS Clinical Staff Pharmacist [email or web address] [facility] 12/15/2019 8:51 AM

## 2019-12-15 NOTE — Evaluation (Signed)
Physical Therapy Evaluation Patient Details Name: Dawn Morrow MRN: [id number] DOB: 1936-01-06 Today's Date: 12/15/2019   History of Present Illness  Pt is an 84 y/o female admitted from ALF secondary to fever and confusion. Possibly secondary to sepsis. PMH includes DM, HTN, CKD, COPD on 2L, PE/DVT, and COVID infection.   Clinical Impression  Pt admitted secondary to problem above with deficits below. Pt required mod A to stand and manual blocking of knees for stability. Pt with BLE weakness and instability, therefore further mobility unsafe to attempt with +1 assist. Pt unsure of what assist was provided at ALF secondary to cognitive deficits. Feel pt would benefit from SNF level therapies if not close to baseline and if ALF unable to provide necessary support. Will continue to follow acutely to maximize functional mobility independence and safety.     Follow Up Recommendations SNF;Supervision/Assistance - 24 hour(unless ALF able to provide necessary assist. )    Equipment Recommendations  None recommended by PT    Recommendations for Other Services       Precautions / Restrictions Precautions Precautions: Fall Restrictions Weight Bearing Restrictions: No      Mobility  Bed Mobility Overal bed mobility: Needs Assistance Bed Mobility: Supine to Sit;Sit to Supine     Supine to sit: Min assist Sit to supine: Min assist   General bed mobility comments: Min A For trunk assist and LE assist.   Transfers Overall transfer level: Needs assistance Equipment used: 1 person hand held assist Transfers: Sit to/from Stand Sit to Stand: Mod assist         General transfer comment: PT stood in front of pt and had pt hold to PT arms. Required 3 attempts to stand secondary to unsteadiness. Required blocking of bilateral knees. Mod A for lift assist and steadying assist. Pt with increased knee instability, so further mobility unsafe with +1 assist.   Ambulation/Gait                 Stairs            Wheelchair Mobility    Modified Rankin (Stroke Patients Only)       Balance Overall balance assessment: Needs assistance Sitting-balance support: Feet supported;No upper extremity supported Sitting balance-Leahy Scale: Fair     Standing balance support: Bilateral upper extremity supported;During functional activity Standing balance-Leahy Scale: Poor Standing balance comment: Reliant on UE and external support                              Pertinent Vitals/Pain Pain Assessment: Faces Faces Pain Scale: Hurts a little bit Pain Location: headache Pain Descriptors / Indicators: Headache Pain Intervention(s): Limited activity within patient's tolerance;Monitored during session;Repositioned    Home Living Family/patient expects to be discharged to:: Assisted living               Home Equipment: Wheelchair - manual      Prior Function Level of Independence: Needs assistance   Gait / Transfers Assistance Needed: Reports she used WC, but was unsure of assist required.   ADL's / Homemaking Assistance Needed: Reports she did not know if she needed assist with ADLs. Anticipate pt required assist given deficits.         Hand Dominance        Extremity/Trunk Assessment   Upper Extremity Assessment Upper Extremity Assessment: Defer to OT evaluation    Lower Extremity Assessment Lower Extremity Assessment: Generalized weakness  Cervical / Trunk Assessment Cervical / Trunk Assessment: Kyphotic  Communication   Communication: No difficulties  Cognition Arousal/Alertness: Awake/alert Behavior During Therapy: WFL for tasks assessed/performed Overall Cognitive Status: No family/caregiver present to determine baseline cognitive functioning                                 General Comments: Pt with difficulty remembering level of assist required at ALF. Slow processing noted as well.       General Comments       Exercises     Assessment/Plan    PT Assessment Patient needs continued PT services  PT Problem List Decreased strength;Decreased activity tolerance;Decreased balance;Decreased mobility;Decreased knowledge of use of DME;Decreased knowledge of precautions       PT Treatment Interventions DME instruction;Gait training;Functional mobility training;Therapeutic activities;Therapeutic exercise;Balance training;Patient/family education    PT Goals (Current goals can be found in the Care Plan section)  Acute Rehab PT Goals Patient Stated Goal: to go home PT Goal Formulation: With patient Time For Goal Achievement: 12/29/19 Potential to Achieve Goals: Good    Frequency Min 2X/week   Barriers to discharge        Co-evaluation               AM-PAC PT "6 Clicks" Mobility  Outcome Measure Help needed turning from your back to your side while in a flat bed without using bedrails?: A Little Help needed moving from lying on your back to sitting on the side of a flat bed without using bedrails?: A Little Help needed moving to and from a bed to a chair (including a wheelchair)?: A Lot Help needed standing up from a chair using your arms (e.g., wheelchair or bedside chair)?: A Lot Help needed to walk in hospital room?: Total Help needed climbing 3-5 steps with a railing? : Total 6 Click Score: 12    End of Session Equipment Utilized During Treatment: Gait belt Activity Tolerance: Patient tolerated treatment well Patient left: in bed;with call bell/phone within reach;with bed alarm set Nurse Communication: Mobility status PT Visit Diagnosis: Unsteadiness on feet (R26.81);Muscle weakness (generalized) (M62.81)    Time: [id number] PT Time Calculation (min) (ACUTE ONLY): 18 min   Charges:   PT Evaluation $PT Eval Moderate Complexity: 1 Mod          Reuel Derby, PT, DPT  Acute Rehabilitation Services  Pager: [phone] Office: [phone]   Rudean Hitt 12/15/2019, 5:45 PM

## 2019-12-16 ENCOUNTER — Inpatient Hospital Stay (HOSPITAL_COMMUNITY): Payer: Medicare Other

## 2019-12-16 LAB — COMPREHENSIVE METABOLIC PANEL
ALT: 51 U/L — ABNORMAL HIGH (ref 0–44)
AST: 50 U/L — ABNORMAL HIGH (ref 15–41)
Albumin: 2.7 g/dL — ABNORMAL LOW (ref 3.5–5.0)
Alkaline Phosphatase: 106 U/L (ref 38–126)
Anion gap: 10 (ref 5–15)
BUN: 16 mg/dL (ref 8–23)
CO2: 24 mmol/L (ref 22–32)
Calcium: 8.4 mg/dL — ABNORMAL LOW (ref 8.9–10.3)
Chloride: 102 mmol/L (ref 98–111)
Creatinine, Ser: 1.11 mg/dL — ABNORMAL HIGH (ref 0.44–1.00)
GFR calc Af Amer: 54 mL/min — ABNORMAL LOW (ref >60)
GFR calc non Af Amer: 47 mL/min — ABNORMAL LOW (ref >60)
Glucose, Bld: 152 mg/dL — ABNORMAL HIGH (ref 70–99)
Potassium: 4 mmol/L (ref 3.5–5.1)
Sodium: 136 mmol/L (ref 135–145)
Total Bilirubin: 0.9 mg/dL (ref 0.3–1.2)
Total Protein: 6.3 g/dL — ABNORMAL LOW (ref 6.5–8.1)

## 2019-12-16 LAB — CBC
HCT: 38.4 % (ref 36.0–46.0)
Hemoglobin: 12.1 g/dL (ref 12.0–15.0)
MCH: 30.3 pg (ref 26.0–34.0)
MCHC: 31.5 g/dL (ref 30.0–36.0)
MCV: 96.2 fL (ref 80.0–100.0)
Platelets: 153 10*3/uL (ref 150–400)
RBC: 3.99 MIL/uL (ref 3.87–5.11)
RDW: 16.5 % — ABNORMAL HIGH (ref 11.5–15.5)
WBC: 7.9 10*3/uL (ref 4.0–10.5)
nRBC: 0.3 % — ABNORMAL HIGH (ref 0.0–0.2)

## 2019-12-16 LAB — URINE CULTURE: Culture: >=100000 — AB

## 2019-12-16 LAB — GLUCOSE, CAPILLARY
Glucose-Capillary: 122 mg/dL — ABNORMAL HIGH (ref 70–99)
Glucose-Capillary: 117 mg/dL — ABNORMAL HIGH (ref 70–99)
Glucose-Capillary: 136 mg/dL — ABNORMAL HIGH (ref 70–99)
Glucose-Capillary: 139 mg/dL — ABNORMAL HIGH (ref 70–99)
Glucose-Capillary: 137 mg/dL — ABNORMAL HIGH (ref 70–99)

## 2019-12-16 MED ORDER — DILTIAZEM HCL 25 MG/5ML IV SOLN
10.0000 mg | Freq: Once | INTRAVENOUS | Status: AC
  Administered 2019-12-16: 10 mg via INTRAVENOUS
  Filled 2019-12-16: qty 5

## 2019-12-16 NOTE — Evaluation (Signed)
Occupational Therapy Evaluation Patient Details Name: Dawn Morrow MRN: [id number] DOB: 29-Jun-1936 Today's Date: 12/16/2019    History of Present Illness Pt is an 84 y/o female admitted from ALF secondary to fever and confusion. Possibly secondary to sepsis. PMH includes DM, HTN, CKD, COPD on 2L, PE/DVT, and COVID infection.    Clinical Impression   Pt PTA: Pt living in ALF- not certain of PLOF levels. Pt reports using W/C, but does not specify transfer level of assist. Currently, pt Pt limited by decreased strength, increased confusion and decreased activity tolerance. Pt modA for bed mobility and modA for transfers. Pt maxA overall for ADL. BUEs have tremors, thus making it difficult for pt to eat/drink. Pt modA for grooming with hand over hand assist. Pt would benefit from continued OT skilled services for ADL, mobility and safety in SNF vs ALF setting. OT following acutely.  Pt with 89% O2 on RA requiring 1.5 L O2 >90%. Pt had taken off priorto OTR arrival.        SNF;Supervision/Assistance - 24 hour(Pt from ALF- can return if they are able to assist )    Equipment Recommendations  3 in 1 bedside commode    Recommendations for Other Services       Precautions / Restrictions Precautions Precautions: Fall Restrictions Weight Bearing Restrictions: No      Mobility Bed Mobility Overal bed mobility: Needs Assistance Bed Mobility: Supine to Sit;Sit to Supine     Supine to sit: Min assist Sit to supine: Min assist   General bed mobility comments: Min A For trunk assist and LE assist.   Transfers Overall transfer level: Needs assistance Equipment used: 1 person hand held assist Transfers: Sit to/from Stand Sit to Stand: Mod assist         General transfer comment: PT stood in front of pt and had pt hold to PT arms. Required 3 attempts to stand secondary to unsteadiness. Required blocking of bilateral knees. Mod A for lift assist and steadying assist. Pt with increased  knee instability, so further mobility unsafe with +1 assist.     Balance Overall balance assessment: Needs assistance Sitting-balance support: Feet supported;No upper extremity supported Sitting balance-Leahy Scale: Fair     Standing balance support: Bilateral upper extremity supported;During functional activity Standing balance-Leahy Scale: Poor Standing balance comment: Reliant on UE and external support                            ADL either performed or assessed with clinical judgement   ADL Overall ADL's : Needs assistance/impaired Eating/Feeding: Moderate assistance;Bed level;Sitting Eating/Feeding Details (indicate cue type and reason): tremors noted in BUES Grooming: Moderate assistance;Sitting;Bed level Grooming Details (indicate cue type and reason): able to grasp item, but difficulty performing active movements Upper Body Bathing: Moderate assistance;Sitting;Bed level   Lower Body Bathing: Maximal assistance;Total assistance;Sitting/lateral leans;Sit to/from stand;+2 for physical assistance   Upper Body Dressing : Moderate assistance;Sitting;Bed level   Lower Body Dressing: Maximal assistance;Total assistance;+2 for physical assistance;Sitting/lateral leans;Sit to/from stand;Cueing for safety   Toilet Transfer: Moderate assistance;Stand-pivot;Cueing for safety;Cueing for sequencing;RW;BSC   Toileting- Clothing Manipulation and Hygiene: Maximal assistance;Total assistance;Cueing for safety;Sitting/lateral lean;Sit to/from stand       Functional mobility during ADLs: Moderate assistance;Rolling walker;Cueing for safety;Cueing for sequencing General ADL Comments: Pt limited by decreased strength, increased confusion and decreased activity tolerance.     Vision Baseline Vision/History: Wears glasses Wears Glasses: At all times Patient Visual Report:  No change from baseline Vision Assessment?: Yes;Vision impaired- to be further tested in functional  context Eye Alignment: Within Functional Limits Ocular Range of Motion: Within Functional Limits Additional Comments: Pt very HOH and having difficulty following commands     Perception     Praxis      Pertinent Vitals/Pain Pain Assessment: Faces Faces Pain Scale: No hurt Pain Intervention(s): Monitored during session     Hand Dominance Right   Extremity/Trunk Assessment Upper Extremity Assessment Upper Extremity Assessment: Generalized weakness;RUE deficits/detail;LUE deficits/detail RUE Deficits / Details: tremors noted, weakness 3-/5 MM grade RUE Coordination: decreased fine motor;decreased gross motor LUE Deficits / Details: tremors noted, weakness 3-/5 MM grade LUE Coordination: decreased fine motor;decreased gross motor   Lower Extremity Assessment Lower Extremity Assessment: Generalized weakness;Defer to PT evaluation   Cervical / Trunk Assessment Cervical / Trunk Assessment: Kyphotic   Communication Communication Communication: HOH   Cognition Arousal/Alertness: Awake/alert Behavior During Therapy: WFL for tasks assessed/performed Overall Cognitive Status: Difficult to assess Area of Impairment: Safety/judgement;Awareness;Problem solving;Attention;Memory;Following commands                   Current Attention Level: Sustained Memory: Decreased short-term memory Following Commands: Follows one step commands with increased time Safety/Judgement: Decreased awareness of deficits;Decreased awareness of safety Awareness: Emergent Problem Solving: Slow processing;Decreased initiation General Comments: Pt requiring increased time to complete tasks and very HOH. Pt slow to respond and difficulty following all commands   General Comments  89% O2 on RA: Pt had taken off priorto OTR arrival.     Exercises     Shoulder Instructions      Home Living Family/patient expects to be discharged to:: Assisted living                             Home  Equipment: Wheelchair - manual          Prior Functioning/Environment Level of Independence: Needs assistance  Gait / Transfers Assistance Needed: Reports she used WC, but was unsure of assist required.  ADL's / Homemaking Assistance Needed: Reports she did not know if she needed assist with ADLs. Anticipate pt required assist given deficits.    Comments: Pt unable to expand with answers in PLOF.        OT Problem List: Decreased strength;Decreased activity tolerance;Impaired balance (sitting and/or standing);Impaired vision/perception;Decreased coordination;Decreased safety awareness;Impaired UE functional use;Pain      OT Treatment/Interventions: Therapeutic exercise;Self-care/ADL training;Energy conservation;DME and/or AE instruction;Neuromuscular education;Therapeutic activities;Cognitive remediation/compensation;Visual/perceptual remediation/compensation;Patient/family education;Balance training    OT Goals(Current goals can be found in the care plan section) Acute Rehab OT Goals Patient Stated Goal: to go home OT Goal Formulation: With patient Time For Goal Achievement: 12/30/19 Potential to Achieve Goals: Fair ADL Goals Pt Will Perform Eating: with set-up;with adaptive utensils;sitting Pt Will Perform Grooming: with min guard assist;standing Pt Will Transfer to Toilet: with min guard assist;ambulating Pt/caregiver will Perform Home Exercise Program: Increased strength;Both right and left upper extremity;With Supervision Additional ADL Goal #1: Pt will follow (3) 2 steps commands with no verbal cues required to attend to tasks.  OT Frequency: Min 2X/week   Barriers to D/C:            Co-evaluation              AM-PAC OT "6 Clicks" Daily Activity     Outcome Measure Help from another person eating meals?: A Little Help from another person taking care of personal grooming?:  A Lot Help from another person toileting, which includes using toliet, bedpan, or urinal?:  A Lot Help from another person bathing (including washing, rinsing, drying)?: A Lot Help from another person to put on and taking off regular upper body clothing?: A Lot Help from another person to put on and taking off regular lower body clothing?: A Lot 6 Click Score: 13   End of Session Equipment Utilized During Treatment: Gait belt;Rolling walker Nurse Communication: Mobility status  Activity Tolerance:   Patient left: in chair;with call bell/phone within reach;with chair alarm set;with nursing/sitter in room  OT Visit Diagnosis: Unsteadiness on feet (R26.81);Muscle weakness (generalized) (M62.81)                Time: [id number] OT Time Calculation (min): 38 min Charges:  OT General Charges $OT Visit: 1 Visit OT Evaluation $OT Eval Moderate Complexity: 1 Mod OT Treatments $Self Care/Home Management : 8-22 mins $Therapeutic Activity: 8-22 mins  Jefferey Pica, OTR/L Acute Rehabilitation Services Pager: [phone] Office: [phone] C 12/16/2019, 3:40 PM

## 2019-12-16 NOTE — Progress Notes (Signed)
CCMD called that pt's HR went up to 160 but nonsustained, MD paged, an order was given, will continue to monitor

## 2019-12-16 NOTE — TOC Initial Note (Addendum)
Transition of Care [facility]) - Initial/Assessment Note    Patient Details  Name: Dawn Morrow MRN: [id number] Date of Birth: Oct 11, 1935  [facility]) CM/SW Contact:    Arvella Merles, [place] Phone Number: 12/16/2019, 5:14 PM  Clinical Narrative:                 CSW received consult for possible SNF placement at time of discharge. CSW met bedside with patient and her nephew Wille Glaser who was present regarding PT recommendation of SNF placement at time of discharge.   Patient provided permission for nephew to discuss the dishcarge plan. Patient's nephew stated patient is from [place] and they do a good job of providing care to patient. Patient's nephew stated he believes the facility could meet patient's needs and provide the around the clock care needed. Patient's nephew stated he handles patient's finance and [name] is the best point of contact for patient's medical care. No further questions reported at this time. CSW to continue to follow and assist with discharge planning needs.   Expected Discharge Plan: Assisted Living Barriers to Discharge: Continued Medical Work up   Patient Goals and CMS Choice   CMS Medicare.gov Compare Post Acute Care list provided to:: Patient Choice offered to / list presented to : Patient  Expected Discharge Plan and Services Expected Discharge Plan: Assisted Living       Living arrangements for the past 2 months: [place]                                      Prior Living Arrangements/Services Living arrangements for the past 2 months: [place] Lives with:: Facility Resident Patient language and need for interpreter reviewed:: Yes Do you feel safe going back to the place where you live?: Yes      Need for Family Participation in Patient Care: No (Comment) Care giver support system in place?: Yes (comment)   Criminal Activity/Legal Involvement Pertinent to Current  Situation/Hospitalization: No - Comment as needed  Activities of Daily Living      Permission Sought/Granted Permission sought to share information with : Family Supports, Customer service manager    Share Information with NAME: [name] & Joe  Permission granted to share info w AGENCY: ALF  Permission granted to share info w Relationship: Niece & Nephew  Permission granted to share info w Contact Information: [phone]  Emotional Assessment     Affect (typically observed): Unable to Assess Orientation: : Oriented to Self, Oriented to Place, Oriented to  Time, Oriented to Situation Alcohol / Substance Use: Not Applicable Psych Involvement: No (comment)  Admission diagnosis:  Acute encephalopathy [G93.40] Altered mental status, unspecified altered mental status type [R41.82] Sepsis without acute organ dysfunction, due to unspecified organism [facility]) [A41.9] Patient Active Problem List   Diagnosis Date Noted  . Sepsis ([place]) 12/14/2019  . Acute encephalopathy 12/14/2019  . COVID-19 virus infection 10/02/2019  . Acute lower UTI 10/02/2019  . Acute kidney failure ([place]) 10/01/2019  . Syncope 09/18/2017  . Near syncope 09/17/2017  . Diabetes mellitus type 2 in obese ([place]) 09/17/2017  . Atrial fibrillation with RVR ([place]) 09/17/2017  . Hypomagnesemia 09/17/2017  . CKD (chronic kidney disease), stage III 09/17/2017  . Forehead contusion   . MGUS (monoclonal gammopathy of unknown significance) 08/13/2016  . Renal failure 07/09/2015  . Follow-up ---------PCP NOTES 05/17/2015  . Essential tremor 12/28/2014  .  Ulnar neuropathy of left upper extremity 12/28/2014  . Intractable pain 06/21/2014  . Back pain 06/21/2014  . Lumbar radiculopathy 06/21/2014  . Encounter for therapeutic drug monitoring 03/14/2014  . Numbness 10/18/2013  . TIA (transient ischemic attack) 09/19/2013  . Weakness 09/18/2013  . Annual physical exam 05/30/2012  . Tremor 01/21/2012  . Failure to thrive  and poor med compliance  01/21/2012  . PE (pulmonary embolism) 11/18/2010  . DVT (deep venous thrombosis) ([place]) 11/18/2010  . Long term current use of anticoagulant 11/18/2010  . ABNORMAL ELECTROCARDIOGRAM 11/10/2010  . OSTEOARTHRITIS 08/06/2010  . DIZZINESS 04/15/2010  . Diabetes ([place]) 06/04/2009  . ACNE ROSACEA 11/28/2008  . BACK PAIN 05/16/2008  . HIP PAIN, RIGHT, CHRONIC 09/15/2007  . Hypothyroidism 09/13/2007  . Dyslipidemia 09/13/2007  . Osteoporosis 07/22/2007  . DEPRESSION 09/11/2006  . HTN (hypertension) 09/11/2006  . ASTHMA 09/11/2006  . COPD (chronic obstructive pulmonary disease) ([place]) 09/11/2006  . GERD 09/11/2006  . Recurrent UTI 09/11/2006  . [place] FILTER INSERTION, HX OF 09/11/2006   PCP:  Colon Branch, MD Pharmacy:  No Pharmacies Listed    Social Determinants of Health (SDOH) Interventions    Readmission Risk Interventions Readmission Risk Prevention Plan 10/03/2019  Transportation Screening Complete  PCP or Specialist Appt within 3-5 Days Complete  HRI or [place] Not Complete  HRI or Home Care Consult comments Patient at her baseline  Social Work Consult for [place] Planning/Counseling [place] Not Applicable  Medication Review (RN Care Manager) Referral to Pharmacy  Some recent data might be hidden

## 2019-12-16 NOTE — Progress Notes (Signed)
PROGRESS NOTE    Dawn Morrow  MLY:[id number] DOB: July 03, 1936 DOA: 12/14/2019 PCP: Colon Branch, MD   Brief Narrative: 84 y.o. 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  Patient reportedly was having confusion this morning, was shaky and dropping her breakfast and unsteady on her feet and had difficulty walking due to weakness.  At baseline he is alert oriented x4.Patient is unable to provide history and it is obtained from discussion the ER physician and the chart review. She denies any nausea vomiting abdominal pain, chest pain, shortness of breath.  Denies numbness tingling.  ED Course: Blood Pressure stable in 140s, tachycardic 110s, temperature one 1.7 rectally, saturating well, BMP with creatinine 1.4, CBC stable, lactic acid 1.9, CXR low lung volume, crowding of the central bronchovascular markings and interstitial prominence may reflect mild edema. CT Head with chronic atrophic changes like before. In the ED patient had tremors in her both upper extremities without facial drooping, oriented to person place but not to year, had good strength with no pronator drift in upper extremities no sensory deficit.  Patient was suspected to have sepsis unclear etiology no evidence of cellulitis or pneumonia and no neck stiffness.Due to fever sicne no cause found requested Covid screen along with influenza, She is being admitted after starting IV fluids and broad-spectrum antibiotics with aztreonam/Flagyl/vancomycin due to penicillin allergy  Subjective: Seen this morning Overnight transienltly HR in 160s Sleeping- able to woke up. Moved all her extremities on command Spoke w/ Rn- family thinks more  confused today  Assessment & Plan:  Fever with confusion, r/o sepsis 2/2 E coli UTI: urine w/ pansensitive E coli.Blood cx NGTD. CXR unremarkable. COVID-19/FLU A/B negative. Abx deescalated to levaquin. Afebrile and VS stable.   Acute metabolic encephalopathy patient confused since 4/8 morning, nonfocal on exam CT head unremarkable.  Suspecting due to #1- mentation better yesterday but more confused today per family- will obtain MRI brain brain today. Has stable   B12,tsh, ammonia level.  Sleepy this am.Cont supportive care and PT OT.Continue home Ativan as needed, Zoloft, Neurontin at slightly lower dose and uptitrate to home dose slowly.  Recurrent PE/DVT on chronic anticoagulation with Xarelto-cont the same.  Hypertension: stable, continue Coreg   COPD./on home oxygen 2 L: Continue bronchodilators supplemental oxygen as needed.   CKD stage IIIa: Creatinine more or less at baseline. Cont gentle ivf for now. Recent Labs  Lab 12/14/19 1214 12/15/19 0329  BUN 18 18  CREATININE 1.45* 1.21*    T2DM- hold OHA, Cont ssi and monitor accucheck. Last hba1c at 6.6 Recent Labs  Lab 12/15/19 0630 12/15/19 1230 12/15/19 1706 12/15/19 2132 12/16/19 0611  GLUCAP 98 93 116* 124* 122*   history of COVID 09/08/19, previously treated  History of atrial fibrillation- chronic/ history of TIA/baseline tremor: Cont Xarelto, Coreg.  Hypothyroidism continue home Synthroid. TSH stable.  GERD- Cont home meds.  Goal of care discussed and patient is currently full code encouraged patient's niece to discuss with patient she is able to make decision  DVT prophylaxis:xarelto Code Status: full  Family Communication: plan of care discussed with patient at bedside, niece was updated on admission. RN spoke w family today-and are concerned about confusion today.  Disposition Plan: Patient is from: ALF Anticipated Disposition: to TBD, PT advised  SNF vs ALF w/. 24 hr supervision Barriers to  discharge or conditions that needs to be met prior to discharge: Patient admitted with altered mental status/diabetic, possible sepsis UTI, work-up and culture in process and remains hospitalized. PT OT eval for disposition  Consultants:see note  Procedures:see note Microbiology:see note  Nutrition: Diet Order            DIET DYS 3 Room service appropriate? Yes; Fluid consistency: Thin  Diet effective now              Body mass index is 35.95 kg/m.  Medications: Scheduled Meds: . atorvastatin  10 mg Oral QHS  . buPROPion  450 mg Oral QHS  . carvedilol  3.125 mg Oral BID WC  . Chlorhexidine Gluconate Cloth  6 each Topical Daily  . ezetimibe  10 mg Oral Daily  . gabapentin  200 mg Oral BID  . insulin aspart  0-5 Units Subcutaneous QHS  . insulin aspart  0-9 Units Subcutaneous TID WC  . levofloxacin  250 mg Oral Daily  . levothyroxine  88 mcg Oral QAC breakfast  . nystatin  1 application Topical BID  . rivaroxaban  20 mg Oral Q supper  . sertraline  75 mg Oral Q1200   Continuous Infusions: . sodium chloride Stopped (12/16/19 0518)  . metronidazole 500 mg (12/16/19 0518)    Antimicrobials: Anti-infectives (From admission, onward)   Start     Dose/Rate Route Frequency Ordered Stop   12/16/19 1500  vancomycin (VANCOREADY) IVPB 1250 mg/250 mL  Status:  Discontinued     1,250 mg 166.7 mL/hr over 90 Minutes Intravenous Every 48 hours 12/14/19 1458 12/15/19 0849   12/15/19 1630  vancomycin (VANCOREADY) IVPB 750 mg/150 mL  Status:  Discontinued     750 mg 150 mL/hr over 60 Minutes Intravenous Every 24 hours 12/15/19 0849 12/15/19 1108   12/15/19 1115  levofloxacin (LEVAQUIN) tablet 250 mg     250 mg Oral Daily 12/15/19 1108 12/18/19 0959   12/14/19 2200  aztreonam (AZACTAM) 2 g in sodium chloride 0.9 % 100 mL IVPB  Status:  Discontinued     2 g 200 mL/hr over 30 Minutes Intravenous Every 8 hours 12/14/19 1458 12/15/19 1108   12/14/19 2200  metroNIDAZOLE (FLAGYL) IVPB 500 mg      500 mg 100 mL/hr over 60 Minutes Intravenous Every 8 hours 12/14/19 1532     12/14/19 1400  aztreonam (AZACTAM) 2 g in sodium chloride 0.9 % 100 mL IVPB     2 g 200 mL/hr over 30 Minutes Intravenous  Once 12/14/19 1349 12/14/19 1624   12/14/19 1400  metroNIDAZOLE (FLAGYL) IVPB 500 mg     500 mg 100 mL/hr over 60 Minutes Intravenous  Once 12/14/19 1349 12/14/19 1625   12/14/19 1400  vancomycin (VANCOCIN) IVPB 1000 mg/200 mL premix  Status:  Discontinued     1,000 mg 200 mL/hr over 60 Minutes Intravenous  Once 12/14/19 1349 12/14/19 1354   12/14/19 1400  vancomycin (VANCOREADY) IVPB 2000 mg/400 mL     2,000 mg 200 mL/hr over 120 Minutes Intravenous  Once 12/14/19 1354 12/14/19 1938     Objective: Vitals: Today's Vitals   12/15/19 2331 12/16/19 0444 12/16/19 0500 12/16/19 0850  BP: (!) 164/107 (!) 160/105  136/85  Pulse: 66 61  (!) 50  Resp: 18 (!) 22  20  Temp: 98.6 F (37 C) 99.3 F (37.4 C)  97.7 F (36.5 C)  TempSrc: Oral Oral  Oral  SpO2:  96% 97% 97% 92%  Weight:      Height:      PainSc:        Intake/Output Summary (Last 24 hours) at 12/16/2019 0907 Last data filed at 12/16/2019 0518 Gross per 24 hour  Intake 1744.82 ml  Output 1300 ml  Net 444.82 ml   Filed Weights   12/14/19 1534 12/14/19 1832  Weight: 95 kg 95 kg   Weight change:    Intake/Output from previous day: 04/09 0701 - 04/10 0700 In: 1744.8 [P.O.:120; I.V.:1315; IV Piggyback:309.8] Out: 1300 [Urine:1300] Intake/Output this shift: No intake/output data recorded.  Examination:  General exam:s sleepy,on [place],weak appearing. HEENT:Oral mucosa moist, Ear/Nose WNL grossly,dentition normal. Respiratory system: bilaterally clear,no wheezing or crackles,no use of accessory muscle, non tender. Cardiovascular system: S1 & S2 +, regular, No JVD. Gastrointestinal system: Abdomen soft, NT,ND, BS+. Nervous System: sleepy,moving extremities and grossly nonfocal Extremities: No edema, distal peripheral  pulses palpable.  Skin: No rashes,no icterus. MSK: Normal muscle bulk,tone, power  Data Reviewed: I have personally reviewed following labs and imaging studies CBC: Recent Labs  Lab 12/14/19 1214 12/15/19 0329  WBC 5.7 4.4  NEUTROABS 4.9  --   HGB 12.4 11.8*  HCT 40.8 38.6  MCV 99.3 97.5  PLT 169 546*   Basic Metabolic Panel: Recent Labs  Lab 12/14/19 1214 12/15/19 0329  NA 141 139  K 4.4 4.2  CL 104 104  CO2 27 26  GLUCOSE 104* 142*  BUN 18 18  CREATININE 1.45* 1.21*  CALCIUM 8.6* 8.3*   GFR: Estimated Creatinine Clearance: 40.8 mL/min (A) (by C-G formula based on SCr of 1.21 mg/dL (H)). Liver Function Tests: Recent Labs  Lab 12/14/19 1214  AST 20  ALT 15  ALKPHOS 71  BILITOT 0.6  PROT 6.8  ALBUMIN 3.3*   No results for input(s): LIPASE, AMYLASE in the last 168 hours. Recent Labs  Lab 12/15/19 0329  AMMONIA 29   Coagulation Profile: Recent Labs  Lab 12/14/19 1419  INR 1.6*   Cardiac Enzymes: No results for input(s): CKTOTAL, CKMB, CKMBINDEX, TROPONINI in the last 168 hours. BNP (last 3 results) No results for input(s): PROBNP in the last 8760 hours. HbA1C: Recent Labs    12/14/19 1214  HGBA1C 6.1*   CBG: Recent Labs  Lab 12/15/19 0630 12/15/19 1230 12/15/19 1706 12/15/19 2132 12/16/19 0611  GLUCAP 98 93 116* 124* 122*   Lipid Profile: No results for input(s): CHOL, HDL, LDLCALC, TRIG, CHOLHDL, LDLDIRECT in the last 72 hours. Thyroid Function Tests: Recent Labs    12/15/19 0329  TSH 2.492   Anemia Panel: Recent Labs    12/15/19 0329  VITAMINB12 186   Sepsis Labs: Recent Labs  Lab 12/14/19 1351 12/14/19 1530  LATICACIDVEN 1.9 1.5    Recent Results (from the past 240 hour(s))  Culture, blood (routine x 2)     Status: None (Preliminary result)   Collection Time: 12/14/19  1:51 PM   Specimen: BLOOD RIGHT FOREARM  Result Value Ref Range Status   Specimen Description BLOOD RIGHT FOREARM  Final   Special Requests   Final     BOTTLES DRAWN AEROBIC ONLY Blood Culture results may not be optimal due to an inadequate volume of blood received in culture bottles   Culture   Final    NO GROWTH < 24 HOURS Performed at [facility], [place] [address], [place], [place] [address]    Report Status PENDING  Incomplete  Culture, blood (routine x 2)  Status: None (Preliminary result)   Collection Time: 12/14/19  2:19 PM   Specimen: BLOOD  Result Value Ref Range Status   Specimen Description BLOOD RIGHT ANTECUBITAL  Final   Special Requests   Final    BOTTLES DRAWN AEROBIC ONLY Blood Culture adequate volume   Culture   Final    NO GROWTH < 24 HOURS Performed at [facility], [address] [place], [place] [address]    Report Status PENDING  Incomplete  Urine culture     Status: Abnormal   Collection Time: 12/14/19  3:20 PM   Specimen: Urine, Catheterized  Result Value Ref Range Status   Specimen Description URINE, CATHETERIZED  Final   Special Requests   Final    NONE Performed at [facility], [address], [place], [place] [address]    Culture >=100,000 COLONIES/mL ESCHERICHIA COLI (A)  Final   Report Status 12/16/2019 FINAL  Final   Organism ID, Bacteria ESCHERICHIA COLI (A)  Final      Susceptibility   Escherichia coli - MIC*    AMPICILLIN <=2 SENSITIVE Sensitive     CEFAZOLIN <=4 SENSITIVE Sensitive     CEFTRIAXONE <=0.25 SENSITIVE Sensitive     CIPROFLOXACIN <=0.25 SENSITIVE Sensitive     GENTAMICIN <=1 SENSITIVE Sensitive     IMIPENEM <=0.25 SENSITIVE Sensitive     NITROFURANTOIN <=16 SENSITIVE Sensitive     TRIMETH/SULFA <=20 SENSITIVE Sensitive     AMPICILLIN/SULBACTAM <=2 SENSITIVE Sensitive     PIP/TAZO <=4 SENSITIVE Sensitive     * >=100,000 COLONIES/mL ESCHERICHIA COLI  Respiratory Panel by RT PCR (Flu A&B, Covid) - Nasopharyngeal Swab     Status: None   Collection Time: 12/14/19  3:33 PM   Specimen: Nasopharyngeal Swab  Result Value Ref Range Status   SARS Coronavirus  2 by RT PCR NEGATIVE NEGATIVE Final    Comment: (NOTE) SARS-CoV-2 target nucleic acids are NOT DETECTED. The SARS-CoV-2 RNA is generally detectable in upper respiratoy specimens during the acute phase of infection. The lowest concentration of SARS-CoV-2 viral copies this assay can detect is 131 copies/mL. A negative result does not preclude SARS-Cov-2 infection and should not be used as the sole basis for treatment or other patient management decisions. A negative result may occur with  improper specimen collection/handling, submission of specimen other than nasopharyngeal swab, presence of viral mutation(s) within the areas targeted by this assay, and inadequate number of viral copies (<131 copies/mL). A negative result must be combined with clinical observations, patient history, and epidemiological information. The expected result is Negative. Fact Sheet for Patients:  PinkCheek.be Fact Sheet for Healthcare Providers:  GravelBags.it This test is not yet ap proved or cleared by the [place] FDA and  has been authorized for detection and/or diagnosis of SARS-CoV-2 by FDA under an Emergency Use Authorization (EUA). This EUA will remain  in effect (meaning this test can be used) for the duration of the COVID-19 declaration under Section 564(b)(1) of the Act, 21 U.S.C. section 360bbb-3(b)(1), unless the authorization is terminated or revoked sooner.    Influenza A by PCR NEGATIVE NEGATIVE Final   Influenza B by PCR NEGATIVE NEGATIVE Final    Comment: (NOTE) The Xpert Xpress SARS-CoV-2/FLU/RSV assay is intended as an aid in  the diagnosis of influenza from Nasopharyngeal swab specimens and  should not be used as a sole basis for treatment. Nasal washings and  aspirates are unacceptable for Xpert Xpress SARS-CoV-2/FLU/RSV  testing. Fact Sheet for Patients:  PinkCheek.be Fact Sheet for Healthcare  Providers: GravelBags.it This test is not yet approved or cleared by the [place] FDA and  has been authorized for detection and/or diagnosis of SARS-CoV-2 by  FDA under an Emergency Use Authorization (EUA). This EUA will remain  in effect (meaning this test can be used) for the duration of the  Covid-19 declaration under Section 564(b)(1) of the Act, 21  U.S.C. section 360bbb-3(b)(1), unless the authorization is  terminated or revoked. Performed at [facility], [place] [address] [place], [place] [address]       Radiology Studies: DG Chest 2 View  Result Date: 12/14/2019 CLINICAL DATA:  Altered mental status, shortness of breath EXAM: CHEST - 2 VIEW COMPARISON:  10/01/2019 FINDINGS: Mild cardiomegaly. Atherosclerotic calcification of the aortic knob. Low lung volumes with crowding of the central bronchovascular markings. Interstitial prominence may reflect mild edema. No pneumothorax or pleural effusion is evident. IVC filter is noted. IMPRESSION: Mild cardiomegaly. Low lung volumes with crowding of the central bronchovascular markings and interstitial prominence may reflect mild edema. Electronically Signed   By: Davina Poke D.O.   On: 12/14/2019 12:49   CT Head Wo Contrast  Result Date: 12/14/2019 CLINICAL DATA:  Altered mental status EXAM: CT HEAD WITHOUT CONTRAST TECHNIQUE: Contiguous axial images were obtained from the base of the skull through the vertex without intravenous contrast. COMPARISON:  09/17/2017 FINDINGS: Brain: Chronic atrophic and ischemic changes are noted. No findings to suggest acute hemorrhage, acute infarction or space-occupying mass lesion are noted. Vascular: No hyperdense vessel or unexpected calcification. Skull: Normal. Negative for fracture or focal lesion. Sinuses/Orbits: No acute finding. Other: None. IMPRESSION: Chronic atrophic and ischemic changes similar to that seen on prior exam. No acute abnormality is noted.  Electronically Signed   By: Inez Catalina M.D.   On: 12/14/2019 13:03     LOS: 2 days   Time spent: More than 50% of that time was spent in counseling and/or coordination of care.  Antonieta Pert, MD [facility]  12/16/2019, 9:07 AM

## 2019-12-17 LAB — GLUCOSE, CAPILLARY
Glucose-Capillary: 132 mg/dL — ABNORMAL HIGH (ref 70–99)
Glucose-Capillary: 180 mg/dL — ABNORMAL HIGH (ref 70–99)
Glucose-Capillary: 118 mg/dL — ABNORMAL HIGH (ref 70–99)
Glucose-Capillary: 128 mg/dL — ABNORMAL HIGH (ref 70–99)

## 2019-12-17 LAB — SARS CORONAVIRUS 2 (TAT 6-24 HRS): SARS Coronavirus 2: NEGATIVE

## 2019-12-17 MED ORDER — CARVEDILOL 6.25 MG PO TABS
6.2500 mg | ORAL_TABLET | Freq: Two times a day (BID) | ORAL | Status: DC
  Administered 2019-12-17 – 2019-12-18 (×3): 6.25 mg via ORAL
  Filled 2019-12-17 (×3): qty 1

## 2019-12-17 MED ORDER — METOPROLOL TARTRATE 5 MG/5ML IV SOLN: 2.5000 mg | Freq: Four times a day (QID) | INTRAVENOUS | Status: DC | PRN

## 2019-12-17 NOTE — Progress Notes (Signed)
PROGRESS NOTE    Dawn Morrow  XBM:[id number] DOB: 04/17/1936 DOA: 12/14/2019 PCP: Colon Branch, MD   Brief Narrative: 84 y.o. 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  Patient reportedly was having confusion this morning, was shaky and dropping her breakfast and unsteady on her feet and had difficulty walking due to weakness.  At baseline he is alert oriented x4.Patient is unable to provide history and it is obtained from discussion the ER physician and the chart review. She denies any nausea vomiting abdominal pain, chest pain, shortness of breath.  Denies numbness tingling.  ED Course: Blood Pressure stable in 140s, tachycardic 110s, temperature one 1.7 rectally, saturating well, BMP with creatinine 1.4, CBC stable, lactic acid 1.9, CXR low lung volume, crowding of the central bronchovascular markings and interstitial prominence may reflect mild edema. CT Head with chronic atrophic changes like before. In the ED patient had tremors in her both upper extremities without facial drooping, oriented to person place but not to year, had good strength with no pronator drift in upper extremities no sensory deficit.  Patient was suspected to have sepsis unclear etiology no evidence of cellulitis or pneumonia and no neck stiffness.Due to fever sicne no cause found requested Covid screen along with influenza, She is being admitted after starting IV fluids and broad-spectrum antibiotics with aztreonam/Flagyl/vancomycin due to penicillin allergy  Subjective:  She is much more alert,awake today she is able to answer questions about mildly confused knows she is in the hospital knows current month April and year  2021 but unable to tell the president. No fever overnight, heart rate up to 110-12ss Spoke with patient's niece on the phone  Assessment & Plan:  Fever with confusion, r/o sepsis 2/2 E coli UTI: urine w/ pansensitive E coli.Blood cx NGTD. CXR unremarkable. COVID-19/FLU A/B negative. Abx deescalated to levaquin. Afebrile and VS stable.   Acute metabolic encephalopathy patient confused since 4/8 morning, nonfocal on exam CT head unremarkable.  Suspecting due to #1- mentation better somewhat on 4/9 but more confused 4/10-underwent MRI after clearance by radiology (has IVC filter), MRI shows no acute stroke but fairly severe chronic small vessel disease.Has stable   B12,tsh, ammonia level. Cont supportive care and PT OT. Continue home Ativan as needed, Zoloft, Neurontin at slightly lower dose and uptitrate to home dose slowly.  Mentation overall much better today.  Recurrent PE/DVT on chronic anticoagulation-Xarelto-cont the same.  Hypertension: stable, continue Coreg-increase the dose for better rate control   COPD./on home oxygen 2 L: Continue bronchodilators supplemental oxygen as needed.   CKD stage IIIa: Creatinine more or less at baseline at 1.1. Cont gentle ivf NSS   T2DM- hold OHA, sugar stable. Cont ssi and monitor accucheck. Last hba1c at 6.6 Recent Labs  Lab 12/16/19 1215 12/16/19 1619 12/16/19 2109 12/17/19 0628 12/17/19 1136  GLUCAP 136* 139* 137* 132* 180*   history of COVID 09/08/19, previously treated  History of atrial fibrillation- chronic/ history of TIA/baseline tremor:  A. fib with RVR increase Coreg to 6.25 continue Xarelto.    Hypothyroidism continue home Synthroid. TSH stable.  GERD- Cont home meds.  Goal of care discussed and patient is currently full code encouraged patient's niece to discuss with patient she is able to make decision  DVT prophylaxis:xarelto Code Status:  full  Family Communication: plan of care discussed with patient at bedside,  niece was updated on admission, also updated today, agreeable for return to assisted living facility tomorrow .  Disposition Plan: Patient is from: ALF Anticipated Disposition: to TBD, PT advised SNF vs ALF w/. 24 hr supervision Barriers to discharge or conditions that needs to be met prior to discharge: Patient admitted with altered mental status UTI/possible sepsis-mental status remains confused but improving, anticipate discharge back to assisted living facility tomorrow, Covid ordered.   Consultants:see note  Procedures:s CT head on admission MRI Brain w/o contrast 4/10 Chronic but progressed since 2015 signal changes in the brain compatible with fairly severe chronic small vessel disease.  Microbiology:see note  Nutrition: Diet Order            DIET DYS 3 Room service appropriate? Yes; Fluid consistency: Thin  Diet effective now              Body mass index is 35.95 kg/m.  Medications: Scheduled Meds:  atorvastatin  10 mg Oral QHS   buPROPion  450 mg Oral QHS   carvedilol  6.25 mg Oral BID WC   Chlorhexidine Gluconate Cloth  6 each Topical Daily   ezetimibe  10 mg Oral Daily   gabapentin  200 mg Oral BID   insulin aspart  0-5 Units Subcutaneous QHS   insulin aspart  0-9 Units Subcutaneous TID WC   levothyroxine  88 mcg Oral QAC breakfast   nystatin  1 application Topical BID   rivaroxaban  20 mg Oral Q supper   sertraline  75 mg Oral Q1200   Continuous Infusions:  sodium chloride 50 mL/hr at 12/16/19 2006    Antimicrobials: Anti-infectives (From admission, onward)   Start     Dose/Rate Route Frequency Ordered Stop   12/16/19 1500  vancomycin (VANCOREADY) IVPB 1250 mg/250 mL  Status:  Discontinued     1,250 mg 166.7 mL/hr over 90 Minutes Intravenous Every 48 hours 12/14/19 1458 12/15/19 0849   12/15/19 1630  vancomycin (VANCOREADY) IVPB 750 mg/150 mL  Status:  Discontinued     750 mg 150 mL/hr over 60 Minutes Intravenous Every 24 hours 12/15/19  0849 12/15/19 1108   12/15/19 1115  levofloxacin (LEVAQUIN) tablet 250 mg     250 mg Oral Daily 12/15/19 1108 12/17/19 0920   12/14/19 2200  aztreonam (AZACTAM) 2 g in sodium chloride 0.9 % 100 mL IVPB  Status:  Discontinued     2 g 200 mL/hr over 30 Minutes Intravenous Every 8 hours 12/14/19 1458 12/15/19 1108   12/14/19 2200  metroNIDAZOLE (FLAGYL) IVPB 500 mg  Status:  Discontinued     500 mg 100 mL/hr over 60 Minutes Intravenous Every 8 hours 12/14/19 1532 12/16/19 1043   12/14/19 1400  aztreonam (AZACTAM) 2 g in sodium chloride 0.9 % 100 mL IVPB     2 g 200 mL/hr over 30 Minutes Intravenous  Once 12/14/19 1349 12/14/19 1624   12/14/19 1400  metroNIDAZOLE (FLAGYL) IVPB 500 mg     500 mg 100 mL/hr over 60 Minutes Intravenous  Once 12/14/19 1349 12/14/19 1625   12/14/19 1400  vancomycin (VANCOCIN) IVPB 1000 mg/200 mL premix  Status:  Discontinued     1,000 mg 200 mL/hr over 60 Minutes Intravenous  Once 12/14/19 1349 12/14/19 1354   12/14/19 1400  vancomycin (VANCOREADY) IVPB 2000 mg/400 mL     2,000 mg 200 mL/hr over 120 Minutes Intravenous  Once 12/14/19 1354 12/14/19 1938  Objective: Vitals: Today's Vitals   12/17/19 0825 12/17/19 0839 12/17/19 0900 12/17/19 1138  BP: (!) 140/92 (!) 158/94 (!) 154/90 117/85  Pulse: (!) 117 (!) 110 (!) 105 100  Resp: '20 18 18 20  '$ Temp: 98.5 F (36.9 C) 98.4 F (36.9 C) 98.4 F (36.9 C) 98.6 F (37 C)  TempSrc: Oral Oral Oral Oral  SpO2: 92% 96% 96% 98%  Weight:      Height:      PainSc:  0-No pain 0-No pain     Intake/Output Summary (Last 24 hours) at 12/17/2019 1201 Last data filed at 12/17/2019 0010 Gross per 24 hour  Intake --  Output 1250 ml  Net -1250 ml   Filed Weights   12/14/19 1534 12/14/19 1832  Weight: 95 kg 95 kg   Weight change:    Intake/Output from previous day: 04/10 0701 - 04/11 0700 In: -  Out: 1250 [Urine:1250] Intake/Output this shift: No intake/output data recorded.  Examination:  General  exam:alert awake oriented to place, current month/year, weak appearing. HEENT:Oral mucosa moist, Ear/Nose WNL grossly,dentition normal. Respiratory system: bilaterally clear,no wheezing or crackles,no use of accessory muscle, non tender. Cardiovascular system: S1 & S2 +, regular, No JVD. Gastrointestinal system: Abdomen soft, NT,ND, BS+. Nervous System: Alert awake,moving extremities and grossly nonfocal Extremities: No edema, distal peripheral pulses palpable.  Skin: No rashes,no icterus. MSK: Normal muscle bulk,tone, power  Data Reviewed: I have personally reviewed following labs and imaging studies CBC: Recent Labs  Lab 12/14/19 1214 12/15/19 0329 12/16/19 1430  WBC 5.7 4.4 7.9  NEUTROABS 4.9  --   --   HGB 12.4 11.8* 12.1  HCT 40.8 38.6 38.4  MCV 99.3 97.5 96.2  PLT 169 141* 751   Basic Metabolic Panel: Recent Labs  Lab 12/14/19 1214 12/15/19 0329 12/16/19 1430  NA 141 139 136  K 4.4 4.2 4.0  CL 104 104 102  CO2 '27 26 24  '$ GLUCOSE 104* 142* 152*  BUN '18 18 16  '$ CREATININE 1.45* 1.21* 1.11*  CALCIUM 8.6* 8.3* 8.4*   GFR: Estimated Creatinine Clearance: 44.4 mL/min (A) (by C-G formula based on SCr of 1.11 mg/dL (H)). Liver Function Tests: Recent Labs  Lab 12/14/19 1214 12/16/19 1430  AST 20 50*  ALT 15 51*  ALKPHOS 71 106  BILITOT 0.6 0.9  PROT 6.8 6.3*  ALBUMIN 3.3* 2.7*   No results for input(s): LIPASE, AMYLASE in the last 168 hours. Recent Labs  Lab 12/15/19 0329  AMMONIA 29   Coagulation Profile: Recent Labs  Lab 12/14/19 1419  INR 1.6*   Cardiac Enzymes: No results for input(s): CKTOTAL, CKMB, CKMBINDEX, TROPONINI in the last 168 hours. BNP (last 3 results) No results for input(s): PROBNP in the last 8760 hours. HbA1C: Recent Labs    12/14/19 1214  HGBA1C 6.1*   CBG: Recent Labs  Lab 12/16/19 1215 12/16/19 1619 12/16/19 2109 12/17/19 0628 12/17/19 1136  GLUCAP 136* 139* 137* 132* 180*   Lipid Profile: No results for input(s):  CHOL, HDL, LDLCALC, TRIG, CHOLHDL, LDLDIRECT in the last 72 hours. Thyroid Function Tests: Recent Labs    12/15/19 0329  TSH 2.492   Anemia Panel: Recent Labs    12/15/19 0329  VITAMINB12 186   Sepsis Labs: Recent Labs  Lab 12/14/19 1351 12/14/19 1530  LATICACIDVEN 1.9 1.5    Recent Results (from the past 240 hour(s))  Culture, blood (routine x 2)     Status: None (Preliminary result)   Collection Time: 12/14/19  1:51  PM   Specimen: BLOOD RIGHT FOREARM  Result Value Ref Range Status   Specimen Description BLOOD RIGHT FOREARM  Final   Special Requests   Final    BOTTLES DRAWN AEROBIC ONLY Blood Culture results may not be optimal due to an inadequate volume of blood received in culture bottles   Culture   Final    NO GROWTH 3 DAYS Performed at [facility], [place] [address], [place], [place] [address]    Report Status PENDING  Incomplete  Culture, blood (routine x 2)     Status: None (Preliminary result)   Collection Time: 12/14/19  2:19 PM   Specimen: BLOOD  Result Value Ref Range Status   Specimen Description BLOOD RIGHT ANTECUBITAL  Final   Special Requests   Final    BOTTLES DRAWN AEROBIC ONLY Blood Culture adequate volume   Culture   Final    NO GROWTH 3 DAYS Performed at [facility], [address] [place], [place] [address]    Report Status PENDING  Incomplete  Urine culture     Status: Abnormal   Collection Time: 12/14/19  3:20 PM   Specimen: Urine, Catheterized  Result Value Ref Range Status   Specimen Description URINE, CATHETERIZED  Final   Special Requests   Final    NONE Performed at [facility], [address], [place], [place] [address]    Culture >=100,000 COLONIES/mL ESCHERICHIA COLI (A)  Final   Report Status 12/16/2019 FINAL  Final   Organism ID, Bacteria ESCHERICHIA COLI (A)  Final      Susceptibility   Escherichia coli - MIC*    AMPICILLIN <=2 SENSITIVE Sensitive     CEFAZOLIN <=4 SENSITIVE Sensitive     CEFTRIAXONE  <=0.25 SENSITIVE Sensitive     CIPROFLOXACIN <=0.25 SENSITIVE Sensitive     GENTAMICIN <=1 SENSITIVE Sensitive     IMIPENEM <=0.25 SENSITIVE Sensitive     NITROFURANTOIN <=16 SENSITIVE Sensitive     TRIMETH/SULFA <=20 SENSITIVE Sensitive     AMPICILLIN/SULBACTAM <=2 SENSITIVE Sensitive     PIP/TAZO <=4 SENSITIVE Sensitive     * >=100,000 COLONIES/mL ESCHERICHIA COLI  Respiratory Panel by RT PCR (Flu A&B, Covid) - Nasopharyngeal Swab     Status: None   Collection Time: 12/14/19  3:33 PM   Specimen: Nasopharyngeal Swab  Result Value Ref Range Status   SARS Coronavirus 2 by RT PCR NEGATIVE NEGATIVE Final    Comment: (NOTE) SARS-CoV-2 target nucleic acids are NOT DETECTED. The SARS-CoV-2 RNA is generally detectable in upper respiratoy specimens during the acute phase of infection. The lowest concentration of SARS-CoV-2 viral copies this assay can detect is 131 copies/mL. A negative result does not preclude SARS-Cov-2 infection and should not be used as the sole basis for treatment or other patient management decisions. A negative result may occur with  improper specimen collection/handling, submission of specimen other than nasopharyngeal swab, presence of viral mutation(s) within the areas targeted by this assay, and inadequate number of viral copies (<131 copies/mL). A negative result must be combined with clinical observations, patient history, and epidemiological information. The expected result is Negative. Fact Sheet for Patients:  PinkCheek.be Fact Sheet for Healthcare Providers:  GravelBags.it This test is not yet ap proved or cleared by the [place] FDA and  has been authorized for detection and/or diagnosis of SARS-CoV-2 by FDA under an Emergency Use Authorization (EUA). This EUA will remain  in effect (meaning this test can be used) for  the duration of the COVID-19 declaration under Section 564(b)(1) of the Act,  21 U.S.C. section 360bbb-3(b)(1), unless the authorization is terminated or revoked sooner.    Influenza A by PCR NEGATIVE NEGATIVE Final   Influenza B by PCR NEGATIVE NEGATIVE Final    Comment: (NOTE) The Xpert Xpress SARS-CoV-2/FLU/RSV assay is intended as an aid in  the diagnosis of influenza from Nasopharyngeal swab specimens and  should not be used as a sole basis for treatment. Nasal washings and  aspirates are unacceptable for Xpert Xpress SARS-CoV-2/FLU/RSV  testing. Fact Sheet for Patients: PinkCheek.be Fact Sheet for Healthcare Providers: GravelBags.it This test is not yet approved or cleared by the [place] FDA and  has been authorized for detection and/or diagnosis of SARS-CoV-2 by  FDA under an Emergency Use Authorization (EUA). This EUA will remain  in effect (meaning this test can be used) for the duration of the  Covid-19 declaration under Section 564(b)(1) of the Act, 21  U.S.C. section 360bbb-3(b)(1), unless the authorization is  terminated or revoked. Performed at [facility], [place] [address], [place], [place] [address]       Radiology Studies: DG Abd 1 View  Result Date: 12/16/2019 CLINICAL DATA:  Altered mental status. Clearing for cranial MRI. EXAM: ABDOMEN - 1 VIEW COMPARISON:  Abdominopelvic CT 12/24/2005. FINDINGS: The bowel gas pattern is normal. There is no evidence of bowel wall thickening or free intraperitoneal air on supine radiography. IVC filter is in place, unchanged in position from previous CT. No other demonstrated foreign bodies. Aortic and splenic artery atherosclerosis. There are diffuse degenerative changes throughout the lumbar spine. IMPRESSION: 1. No acute findings or evidence of bowel obstruction. 2. IVC filter appears unchanged in position from prior CT. Electronically Signed   By: Richardean Sale M.D.   On: 12/16/2019 14:51   MR BRAIN WO CONTRAST  Result Date:  12/16/2019 CLINICAL DATA:  84 year old female with altered mental status, encephalopathy. EXAM: MRI HEAD WITHOUT CONTRAST TECHNIQUE: Multiplanar, multiecho pulse sequences of the brain and surrounding structures were obtained without intravenous contrast. COMPARISON:  Head CT 12/14/2019. Brain MRI 06/21/2014. FINDINGS: Brain: No restricted diffusion to suggest acute infarction. No midline shift, mass effect, evidence of mass lesion, ventriculomegaly, extra-axial collection or acute intracranial hemorrhage. Cervicomedullary junction and pituitary are within normal limits. Chronic but progressed since 2015 and now widespread bilateral cerebral white matter T2 and FLAIR hyperintensity. Associated T2 heterogeneity throughout the bilateral deep gray nuclei, probably representing a combination of perivascular spaces and chronic lacunar infarcts. There are occasional chronic micro hemorrhages in the brain, including in the left thalamus. No cortical encephalomalacia is identified. Mild to moderate T2 heterogeneity in the pons. Cerebellum appears negative. Vascular: Major intracranial vascular flow voids are stable since 2015. Skull and upper cervical spine: Negative for age visible cervical spine, bone marrow signal. Sinuses/Orbits: Stable and negative. Other: A mild left mastoid effusion has resolved since 2015. Visible internal auditory structures appear normal. Scalp and face soft tissues appear negative. IMPRESSION: 1. No acute intracranial abnormality. 2. Chronic but progressed since 2015 signal changes in the brain compatible with fairly severe chronic small vessel disease. Electronically Signed   By: Genevie Ann M.D.   On: 12/16/2019 18:49     LOS: 3 days   Time spent: More than 50% of that time was spent in counseling and/or coordination of care.  Antonieta Pert, MD [facility]  12/17/2019, 12:01 PM

## 2019-12-17 NOTE — Progress Notes (Signed)
MEWS 1 (green) at 0900.  Continue to monitor HR and MEWS.

## 2019-12-17 NOTE — TOC Progression Note (Signed)
Transition of Care [facility]) - Progression Note    Patient Details  Name: VERNEICE CASPERS MRN: [id number] Date of Birth: May 26, 1936  [facility] CM/SW [place], [place] Phone Number: 12/17/2019, 3:05 PM  Clinical Narrative:    CSW contacted Durenda Age to inquire on patient's potential discharge back to the facility on Monday. CSW informed patient will need an updated covid test and patient's needs can be met by facility. CSW informed facility can have PT provided to patient as long as it is recommended. Requested discharge summary and fl2 at discharge. No additional needs expressed.   CSW met bedside with patient and patient's niece Sherri. CSW informed patient and niece that ALF has been contacted and are aware patient may be returning Monday. Niece requested patient be transported via [facility] and that she or patient's nephew Wille Glaser be contacted of transport. CSW will continue to follow.  Expected Discharge Plan: Assisted Living Barriers to Discharge: Continued Medical Work up  Expected Discharge Plan and Services Expected Discharge Plan: Assisted Living       Living arrangements for the past 2 months: Assisted Living Facility                                       Social Determinants of Health (SDOH) Interventions    Readmission Risk Interventions Readmission Risk Prevention Plan 10/03/2019  Transportation Screening Complete  PCP or Specialist Appt within 3-5 Days Complete  HRI or [place] Not Complete  HRI or Home Care Consult comments Patient at her baseline  Social Work Consult for [place] Planning/Counseling [place] Not Applicable  Medication Review Press photographer) Referral to Pharmacy  Some recent data might be hidden

## 2019-12-17 NOTE — Progress Notes (Signed)
MEWS 2, HR at 117-120.  Provider Antonieta Pert, MD notified, see new orders.  Charge nurse Levada Dy, RN notified.  Pt not in acute distress, will follow MEWS guidelines and continue to monitor HR.

## 2019-12-18 DIAGNOSIS — J449 Chronic obstructive pulmonary disease, unspecified: Secondary | ICD-10-CM

## 2019-12-18 LAB — GLUCOSE, CAPILLARY
Glucose-Capillary: 118 mg/dL — ABNORMAL HIGH (ref 70–99)
Glucose-Capillary: 148 mg/dL — ABNORMAL HIGH (ref 70–99)

## 2019-12-18 MED ORDER — LEVOFLOXACIN 250 MG PO TABS: 250.0000 mg | ORAL_TABLET | Freq: Every day | ORAL | 0 refills | Status: AC

## 2019-12-18 MED ORDER — CARVEDILOL 6.25 MG PO TABS: 6.2500 mg | ORAL_TABLET | Freq: Two times a day (BID) | ORAL | 0 refills | Status: DC

## 2019-12-18 NOTE — Progress Notes (Signed)
Covering CSW received request to add Speech therapy to patient's FL2. CSW sent updated FL2 to [facility] (f. [phone]).  Maralee Higuchi LCSW

## 2019-12-18 NOTE — NC FL2 (Addendum)
[place] LEVEL OF CARE SCREENING TOOL     IDENTIFICATION  Patient Name: Dawn Morrow Birthdate: Sep 30, 1935 Sex: female Admission Date (Current Location): 12/14/2019  [facility] [place] and [place] Number:  Herbalist and Address:  [facility], [place] [address] [place], [place] [address]      Provider Number: [id number]  Attending Physician Name and Address:  Antonieta Pert, MD  Relative Name and Phone Number:       Current Level of Care: Hospital Recommended Level of Care: [place] Prior Approval Number:    Date Approved/Denied:   PASRR Number:    Discharge Plan: Other (Comment)(ALF)    Current Diagnoses: Patient Active Problem List   Diagnosis Date Noted  . Sepsis ([place]) 12/14/2019  . Acute encephalopathy 12/14/2019  . COVID-19 virus infection 10/02/2019  . Acute lower UTI 10/02/2019  . Acute kidney failure ([place]) 10/01/2019  . Syncope 09/18/2017  . Near syncope 09/17/2017  . Diabetes mellitus type 2 in obese ([place]) 09/17/2017  . Atrial fibrillation with RVR ([place]) 09/17/2017  . Hypomagnesemia 09/17/2017  . CKD (chronic kidney disease), stage III 09/17/2017  . Forehead contusion   . MGUS (monoclonal gammopathy of unknown significance) 08/13/2016  . Renal failure 07/09/2015  . Follow-up ---------PCP NOTES 05/17/2015  . Essential tremor 12/28/2014  . Ulnar neuropathy of left upper extremity 12/28/2014  . Intractable pain 06/21/2014  . Back pain 06/21/2014  . Lumbar radiculopathy 06/21/2014  . Encounter for therapeutic drug monitoring 03/14/2014  . Numbness 10/18/2013  . TIA (transient ischemic attack) 09/19/2013  . Weakness 09/18/2013  . Annual physical exam 05/30/2012  . Tremor 01/21/2012  . Failure to thrive and poor med compliance  01/21/2012  . PE (pulmonary embolism) 11/18/2010  . DVT (deep venous thrombosis) ([place]) 11/18/2010  . Long term current use of anticoagulant 11/18/2010  . ABNORMAL ELECTROCARDIOGRAM  11/10/2010  . OSTEOARTHRITIS 08/06/2010  . DIZZINESS 04/15/2010  . Diabetes ([place]) 06/04/2009  . ACNE ROSACEA 11/28/2008  . BACK PAIN 05/16/2008  . HIP PAIN, RIGHT, CHRONIC 09/15/2007  . Hypothyroidism 09/13/2007  . Dyslipidemia 09/13/2007  . Osteoporosis 07/22/2007  . DEPRESSION 09/11/2006  . HTN (hypertension) 09/11/2006  . ASTHMA 09/11/2006  . COPD (chronic obstructive pulmonary disease) ([place]) 09/11/2006  . GERD 09/11/2006  . Recurrent UTI 09/11/2006  . GREENFIELD FILTER INSERTION, HX OF 09/11/2006    Orientation RESPIRATION BLADDER Height & Weight     Time, Self, Place  O2(2L [place]) Incontinent Weight: 209 lb 7 oz (95 kg) Height:  5\' 4"  (162.6 cm)  BEHAVIORAL SYMPTOMS/MOOD NEUROLOGICAL BOWEL NUTRITION STATUS      Continent Diet(low carb, low salt)  AMBULATORY STATUS COMMUNICATION OF NEEDS Skin   Limited Assist Verbally Normal                       Personal Care Assistance Level of Assistance  Bathing, Feeding, Dressing Bathing Assistance: Limited assistance Feeding assistance: Limited assistance Dressing Assistance: Limited assistance     Functional Limitations Info  Sight, Hearing Sight Info: Impaired Hearing Info: Impaired      SPECIAL CARE FACTORS FREQUENCY  PT (By licensed PT), OT (By licensed OT)     PT Frequency: 3x/wk with home health OT Frequency: 3x/wk with home health  Speech Frequency: 3x/week with home health            Contractures Contractures Info: Not present    Additional Factors Info  Code Status, Allergies, Psychotropic, Insulin Sliding  Scale Code Status Info: Full Allergies Info: Penicillins, Codeine, Sulfonamide Derivatives Psychotropic Info: Wellbutrin XL 450mg  daily at bed, Zoloft 75mg  daily Insulin Sliding Scale Info: 0-9 units 3x/day with meals; 0-5 units daily at bed       Current Medications (12/18/2019):  This is the current hospital active medication list Current Facility-Administered Medications  Medication Dose  Route Frequency Provider Last Rate Last Admin  . 0.9 %  sodium chloride infusion   Intravenous Continuous Antonieta Pert, MD 50 mL/hr at 12/18/19 0432 New Bag at 12/18/19 0432  . acetaminophen (TYLENOL) tablet 650 mg  650 mg Oral Q6H PRN Kc, Maren [place], MD       Or  . acetaminophen (TYLENOL) suppository 650 mg  650 mg Rectal Q6H PRN Kc, Ramesh, MD      . albuterol (PROVENTIL) (2.5 MG/3ML) 0.083% nebulizer solution 2.5 mg  2.5 mg Nebulization Q2H PRN Kc, Ramesh, MD      . albuterol (PROVENTIL) (2.5 MG/3ML) 0.083% nebulizer solution 2.5 mg  2.5 mg Inhalation Q6H PRN Kc, Ramesh, MD      . atorvastatin (LIPITOR) tablet 10 mg  10 mg Oral QHS Kc, Maren Beach, MD   10 mg at 12/17/19 2107  . buPROPion (WELLBUTRIN XL) 24 hr tablet 450 mg  450 mg Oral QHS Antonieta Pert, MD   450 mg at 12/17/19 2107  . carvedilol (COREG) tablet 6.25 mg  6.25 mg Oral BID WC Kc, Ramesh, MD   6.25 mg at 12/18/19 0836  . Chlorhexidine Gluconate Cloth 2 % PADS 6 each  6 each Topical Daily Antonieta Pert, MD   6 each at 12/18/19 0837  . ezetimibe (ZETIA) tablet 10 mg  10 mg Oral Daily Kc, Ramesh, MD   10 mg at 12/18/19 0836  . gabapentin (NEURONTIN) capsule 200 mg  200 mg Oral BID Kc, Ramesh, MD   200 mg at 12/18/19 0835  . insulin aspart (novoLOG) injection 0-5 Units  0-5 Units Subcutaneous QHS Kc, Ramesh, MD      . insulin aspart (novoLOG) injection 0-9 Units  0-9 Units Subcutaneous TID WC Antonieta Pert, MD   2 Units at 12/17/19 1202  . levothyroxine (SYNTHROID) tablet 88 mcg  88 mcg Oral QAC breakfast Antonieta Pert, MD   88 mcg at 12/18/19 0515  . LORazepam (ATIVAN) tablet 0.5 mg  0.5 mg Oral Q8H PRN Kc, Ramesh, MD   0.5 mg at 12/15/19 2351  . meclizine (ANTIVERT) tablet 25 mg  25 mg Oral QHS PRN Kc, Ramesh, MD      . metoprolol tartrate (LOPRESSOR) injection 2.5 mg  2.5 mg Intravenous Q6H PRN Kc, Ramesh, MD      . nystatin (MYCOSTATIN/NYSTOP) topical powder 1 application  1 application Topical BID Antonieta Pert, MD   1 application at [id number]  .  ondansetron (ZOFRAN) tablet 4 mg  4 mg Oral Q6H PRN Kc, Ramesh, MD       Or  . ondansetron (ZOFRAN) injection 4 mg  4 mg Intravenous Q6H PRN Kc, Ramesh, MD      . polyethylene glycol (MIRALAX / GLYCOLAX) packet 17 g  17 g Oral Daily PRN Kc, Ramesh, MD      . polyvinyl alcohol (LIQUIFILM TEARS) 1.4 % ophthalmic solution 1 drop  1 drop Both Eyes QID PRN Kc, Ramesh, MD      . rivaroxaban (XARELTO) tablet 20 mg  20 mg Oral Q supper Kc, Ramesh, MD   20 mg at 12/17/19 1715  . senna-docusate (Senokot-S) tablet 1 tablet  1 tablet Oral QHS PRN Kc, Ramesh, MD      . sertraline (ZOLOFT) tablet 75 mg  75 mg Oral Q1200 Kc, Ramesh, MD   75 mg at 12/17/19 1151     Discharge Medications: TAKE these medications       acetaminophen 500 MG tablet Commonly known as: TYLENOL Take 500 mg by mouth every 4 (four) hours as needed for mild pain or headache.   albuterol 108 (90 Base) MCG/ACT inhaler Commonly known as: VENTOLIN HFA Inhale 2 puffs into the lungs every 6 (six) hours as needed for wheezing or shortness of breath.   aspirin-acetaminophen-caffeine 250-250-65 MG tablet Commonly known as: EXCEDRIN MIGRAINE Take 2 tablets by mouth every 6 (six) hours as needed for headache (headache). Reported on 10/09/2015   atorvastatin 10 MG tablet Commonly known as: LIPITOR Take 1 tablet (10 mg total) by mouth at bedtime.   buPROPion HCl ER (XL) 450 MG Tb24 Take 1 tablet by mouth at bedtime.   carvedilol 6.25 MG tablet Commonly known as: COREG Take 1 tablet (6.25 mg total) by mouth 2 (two) times daily with a meal. What changed:   medication strength  how much to take   docusate sodium 100 MG capsule Commonly known as: COLACE Take 100 mg by mouth daily.   ezetimibe 10 MG tablet Commonly known as: ZETIA Take 10 mg by mouth daily.   gabapentin 300 MG capsule Commonly known as: NEURONTIN Take 1 cap in AM, 1 cap at noon, 2 caps at bedtime What changed:   how much to take  how to take  this  when to take this  additional instructions   ketoconazole 2 % shampoo Commonly known as: NIZORAL Apply 1 application topically 2 (two) times a week.   levofloxacin 250 MG tablet Commonly known as: Levaquin Take 1 tablet (250 mg total) by mouth daily for 2 doses.   levothyroxine 88 MCG tablet Commonly known as: SYNTHROID Take 88 mcg by mouth daily before breakfast.   linagliptin 5 MG Tabs tablet Commonly known as: Tradjenta Take 1 tablet (5 mg total) by mouth at bedtime.   LORazepam 0.5 MG tablet Commonly known as: ATIVAN Take 0.5 mg by mouth every 8 (eight) hours as needed for anxiety.   meclizine 25 MG tablet Commonly known as: ANTIVERT Take 25 mg by mouth at bedtime as needed for dizziness.   nystatin powder Generic drug: nystatin Apply 1 application topically 2 (two) times daily.   ondansetron 4 MG tablet Commonly known as: ZOFRAN Take 4 mg by mouth every 8 (eight) hours as needed for nausea or vomiting.   OXYGEN Inhale 2 L into the lungs continuous. As directed.   polyethylene glycol 17 g packet Commonly known as: MIRALAX / GLYCOLAX Take 17 g by mouth daily as needed for mild constipation. Reported on 10/09/2015   repaglinide 0.5 MG tablet Commonly known as: Prandin Take 1 tablet (0.5 mg total) by mouth 3 (three) times daily before meals.   rivaroxaban 20 MG Tabs tablet Commonly known as: Xarelto Take 1 tablet (20 mg total) by mouth daily with supper.   sertraline 50 MG tablet Commonly known as: ZOLOFT Take 1.5 tablets by mouth daily at 12 noon.   Systane Balance 0.6 % Soln Generic drug: Propylene Glycol Place 1 drop into both eyes 4 (four) times daily as needed (for dry eyes).     Relevant Imaging Results:  Relevant Lab Results:   Additional Information SS#: [id number]  Geralynn Ochs, LCSW

## 2019-12-18 NOTE — Discharge Summary (Signed)
Physician Discharge Summary  Dawn Morrow [id number] DOB: 06-27-1936 DOA: 12/14/2019  PCP: Colon Branch, MD  Admit date: 12/14/2019 Discharge date: 12/18/2019  Admitted From: ALF Disposition:  ALF  Recommendations for Outpatient Follow-up:  1. Follow up with PCP in 1-2 weeks 2. 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 Patient reportedly was having confusion this morning, was shaky and dropping her breakfast and unsteady on her feet and had difficulty walking due to weakness. At baseline he is alert oriented x4.Patient is unable to providehistory and it isobtained from discussion the ER physician and the chart review. She denies any nausea vomiting abdominal pain, chest pain, shortness of breath. Denies numbness tingling.  ED Course:BloodPressure stable in 140s, tachycardic 110s,temperature one 1.7 rectally, saturating well, BMP with creatinine 1.4, CBC stable, lactic acid 1.9,CXRlow lung volume,crowding of the central bronchovascular markings and interstitial prominence may reflect mild edema.CTHead with chronic atrophic changes like before. In the ED patient had tremors in her both upper extremities without facial drooping, oriented to person place but not to year, had good strength with no pronator drift in upper extremities no sensory deficit. Patient was suspected to have sepsis unclear etiology no evidence of cellulitis or pneumonia and noneck  stiffness.Due to fever sicne nocausefoundrequestedCovid screen along with influenza,She was admitted  Patient urine culture came back positive for E. coli pansensitive, blood culture negative, she had worsening waning mental status underwent further evaluation with MRI brain no acute stroke.  Subsequently patient has now stabilized she is alert awake and at baseline with mental status.  She is being discharged back to assisted living facility as it is able to provide the care she needs case manager confirmed.   Discharge Diagnoses:   Sepsis 2/2 E coli [id number] E coli/Blood cx NGTD.CXR unremarkable.COVID-19/FLU A/B negative.Abx deescalated to levaquin- cont to complete course  Acute metabolic encephalopathypatient confused since 4/8 morning,nonfocal on exam CT head unremarkable.Suspecting due to #1-mentation better somewhat on 4/9 but more confused 4/10-underwent MRI after clearance by radiology (has IVC filter), MRI shows no acute stroke but fairly severe chronic small vessel disease.Has stable B12,tsh, ammonia level. Mentation overall stable. resume home Ativan,Zoloft and Neurontin.Seen by PT/OT did faily wel,, pla nsi to return to alf w pt/24 rh supervision  Recurrent PE/DVT on chronic anticoagulation-Xarelto-cont the same.  Hypertension:stable, continue Coreg-increased the dose to 6.25 for better bp and hr control  COPD./on homeoxygen 2 L:Continue bronchodilators supplemental oxygen as needed.   CKD stage IIIa:Creatinine more or less at baseline at 1.1. Cont gentle ivf NSS   T2DM-resume home [facility].Last hba1c at 6.6  history of COVID 09/08/19,previously treated  History of atrial fibrillation- chronic/history of TIA/baselinetremor: A. fib with RVR-rate controlled on Coreg continue Xarelto.   Hypothyroidism continue home Synthroid. TSH stable.  GERD- Cont home meds.  Goal of care discussed and patient is currently full code encouragedpatient's niece to  discuss with patient she is able to make decision  Subjective: Alert awake at baseline this morning" I am excited about going back home" Discharge Exam: Vitals:   12/18/19 0330 12/18/19 0821  BP: (!) 152/91 (!) 158/98  Pulse: 82 78  Resp: 18 18  Temp: 97.9 F (36.6 C) 97.7 F (36.5 C)  SpO2: 94% 96%   General: Pt is alert, awake, not in acute distress Cardiovascular: RRR, S1/S2 +, no rubs, no gallops Respiratory: CTA bilaterally, no wheezing, no rhonchi Abdominal: Soft, NT, ND, bowel sounds + Extremities: no edema, no cyanosis  Discharge Instructions  Discharge Instructions    DME Bedside commode   Complete by: As directed    3 in 1 bedside commode   Patient needs a bedside commode to treat with the following condition: Physical deconditioning   Diet - low sodium heart healthy   Complete by: As directed    Increase activity slowly   Complete by: As directed      Allergies as of 12/18/2019      Reactions   Penicillins Anaphylaxis   Codeine Other (See Comments)   unknown   Sulfonamide Derivatives Other (See Comments)   unknown      Medication List    TAKE these medications   acetaminophen 500 MG tablet Commonly known as: TYLENOL Take 500 mg by mouth every 4 (four) hours as needed for mild pain or headache.   albuterol 108 (90 Base) MCG/ACT inhaler Commonly known as: VENTOLIN HFA Inhale 2 puffs into the lungs every 6 (six) hours as needed for wheezing or shortness of breath.   aspirin-acetaminophen-caffeine 250-250-65 MG tablet Commonly known as: EXCEDRIN MIGRAINE Take 2 tablets by mouth every 6 (six) hours as needed for headache (headache). Reported on 10/09/2015   atorvastatin 10 MG tablet Commonly known as: LIPITOR Take 1 tablet (10 mg total) by mouth at bedtime.   buPROPion HCl ER (XL) 450 MG Tb24 Take 1 tablet by mouth at bedtime.   carvedilol 6.25 MG tablet Commonly known as: COREG Take 1 tablet (6.25 mg total) by mouth 2 (two) times daily with a  meal. What changed:   medication strength  how much to take   docusate sodium 100 MG capsule Commonly known as: COLACE Take 100 mg by mouth daily.   ezetimibe 10 MG tablet Commonly known as: ZETIA Take 10 mg by mouth daily.   gabapentin 300 MG capsule Commonly known as: NEURONTIN Take 1 cap in AM, 1 cap at noon, 2 caps at bedtime What changed:   how much to take  how to take this  when to take this  additional instructions   ketoconazole 2 % shampoo Commonly known as: NIZORAL Apply 1 application topically 2 (two) times a week.   levofloxacin 250 MG tablet Commonly known as: Levaquin Take 1 tablet (250 mg total) by mouth daily for 2 doses.   levothyroxine 88 MCG tablet Commonly known as: SYNTHROID Take 88 mcg by mouth daily before breakfast.   linagliptin 5 MG Tabs tablet Commonly known as: Tradjenta Take 1 tablet (5 mg total) by mouth at bedtime.   LORazepam 0.5 MG tablet Commonly known as: ATIVAN Take 0.5 mg by mouth every 8 (eight) hours as needed for anxiety.   meclizine 25 MG tablet Commonly known as: ANTIVERT Take 25 mg by mouth at bedtime as needed for dizziness.   nystatin powder Generic drug: nystatin Apply 1 application topically 2 (two) times daily.   ondansetron 4 MG tablet Commonly known as: ZOFRAN Take 4 mg by mouth every 8 (eight) hours as needed for nausea or vomiting.   OXYGEN Inhale 2 L into the lungs continuous. As directed.   polyethylene glycol 17 g packet Commonly known as: MIRALAX / GLYCOLAX Take 17 g  by mouth daily as needed for mild constipation. Reported on 10/09/2015   repaglinide 0.5 MG tablet Commonly known as: Prandin Take 1 tablet (0.5 mg total) by mouth 3 (three) times daily before meals.   rivaroxaban 20 MG Tabs tablet Commonly known as: Xarelto Take 1 tablet (20 mg total) by mouth daily with supper.   sertraline 50 MG tablet Commonly known as: ZOLOFT Take 1.5 tablets by mouth daily at 12 noon.   Systane  Balance 0.6 % Soln Generic drug: Propylene Glycol Place 1 drop into both eyes 4 (four) times daily as needed (for dry eyes).            Durable Medical Equipment  (From admission, onward)         Start     Ordered   12/18/19 0000  DME Bedside commode    Comments: 3 in 1 bedside commode  Question:  Patient needs a bedside commode to treat with the following condition  Answer:  Physical deconditioning   12/18/19 0932         Follow-up [place], MD Follow up in 1 week(s).   Specialty: Internal Medicine Contact information: [address] [place] [address] [phone]          Allergies  Allergen Reactions  . Penicillins Anaphylaxis  . Codeine Other (See Comments)    unknown  . Sulfonamide Derivatives Other (See Comments)    unknown    The results of significant diagnostics from this hospitalization (including imaging, microbiology, ancillary and laboratory) are listed below for reference.    Microbiology: Recent Results (from the past 240 hour(s))  Culture, blood (routine x 2)     Status: None (Preliminary result)   Collection Time: 12/14/19  1:51 PM   Specimen: BLOOD RIGHT FOREARM  Result Value Ref Range Status   Specimen Description BLOOD RIGHT FOREARM  Final   Special Requests   Final    BOTTLES DRAWN AEROBIC ONLY Blood Culture results may not be optimal due to an inadequate volume of blood received in culture bottles   Culture   Final    NO GROWTH 4 DAYS Performed at [facility], [place] [address], [place], [place] [address]    Report Status PENDING  Incomplete  Culture, blood (routine x 2)     Status: None (Preliminary result)   Collection Time: 12/14/19  2:19 PM   Specimen: BLOOD  Result Value Ref Range Status   Specimen Description BLOOD RIGHT ANTECUBITAL  Final   Special Requests   Final    BOTTLES DRAWN AEROBIC ONLY Blood Culture adequate volume   Culture   Final    NO GROWTH 4 DAYS Performed at [facility], [place] [address] [place], [place] [address]    Report Status PENDING  Incomplete  Urine culture     Status: Abnormal   Collection Time: 12/14/19  3:20 PM   Specimen: Urine, Catheterized  Result Value Ref Range Status   Specimen Description URINE, CATHETERIZED  Final   Special Requests   Final    NONE Performed at [facility], [address], [place], [place] [address]    Culture >=100,000 COLONIES/mL ESCHERICHIA COLI (A)  Final   Report Status 12/16/2019 FINAL  Final   Organism ID, Bacteria ESCHERICHIA COLI (A)  Final      Susceptibility   Escherichia coli - MIC*    AMPICILLIN <=2 SENSITIVE Sensitive  CEFAZOLIN <=4 SENSITIVE Sensitive     CEFTRIAXONE <=0.25 SENSITIVE Sensitive     CIPROFLOXACIN <=0.25 SENSITIVE Sensitive     GENTAMICIN <=1 SENSITIVE Sensitive     IMIPENEM <=0.25 SENSITIVE Sensitive     NITROFURANTOIN <=16 SENSITIVE Sensitive     TRIMETH/SULFA <=20 SENSITIVE Sensitive     AMPICILLIN/SULBACTAM <=2 SENSITIVE Sensitive     PIP/TAZO <=4 SENSITIVE Sensitive     * >=100,000 COLONIES/mL ESCHERICHIA COLI  Respiratory Panel by RT PCR (Flu A&B, Covid) - Nasopharyngeal Swab     Status: None   Collection Time: 12/14/19  3:33 PM   Specimen: Nasopharyngeal Swab  Result Value Ref Range Status   SARS Coronavirus 2 by RT PCR NEGATIVE NEGATIVE Final    Comment: (NOTE) SARS-CoV-2 target nucleic acids are NOT DETECTED. The SARS-CoV-2 RNA is generally detectable in upper respiratoy specimens during the acute phase of infection. The lowest concentration of SARS-CoV-2 viral copies this assay can detect is 131 copies/mL. A negative result does not preclude SARS-Cov-2 infection and should not be used as the sole basis for treatment or other patient management decisions. A negative result may occur with  improper specimen collection/handling, submission of specimen other than nasopharyngeal swab, presence of viral mutation(s) within the areas targeted by this  assay, and inadequate number of viral copies (<131 copies/mL). A negative result must be combined with clinical observations, patient history, and epidemiological information. The expected result is Negative. Fact Sheet for Patients:  PinkCheek.be Fact Sheet for Healthcare Providers:  GravelBags.it This test is not yet ap proved or cleared by the [place] FDA and  has been authorized for detection and/or diagnosis of SARS-CoV-2 by FDA under an Emergency Use Authorization (EUA). This EUA will remain  in effect (meaning this test can be used) for the duration of the COVID-19 declaration under Section 564(b)(1) of the Act, 21 U.S.C. section 360bbb-3(b)(1), unless the authorization is terminated or revoked sooner.    Influenza A by PCR NEGATIVE NEGATIVE Final   Influenza B by PCR NEGATIVE NEGATIVE Final    Comment: (NOTE) The Xpert Xpress SARS-CoV-2/FLU/RSV assay is intended as an aid in  the diagnosis of influenza from Nasopharyngeal swab specimens and  should not be used as a sole basis for treatment. Nasal washings and  aspirates are unacceptable for Xpert Xpress SARS-CoV-2/FLU/RSV  testing. Fact Sheet for Patients: PinkCheek.be Fact Sheet for Healthcare Providers: GravelBags.it This test is not yet approved or cleared by the [place] FDA and  has been authorized for detection and/or diagnosis of SARS-CoV-2 by  FDA under an Emergency Use Authorization (EUA). This EUA will remain  in effect (meaning this test can be used) for the duration of the  Covid-19 declaration under Section 564(b)(1) of the Act, 21  U.S.C. section 360bbb-3(b)(1), unless the authorization is  terminated or revoked. Performed at [facility], [place] [address] [place], [place] [address]   SARS CORONAVIRUS 2 (TAT 6-24 HRS) Nasopharyngeal Nasopharyngeal Swab     Status: None    Collection Time: 12/17/19  2:50 PM   Specimen: Nasopharyngeal Swab  Result Value Ref Range Status   SARS Coronavirus 2 NEGATIVE NEGATIVE Final    Comment: (NOTE) SARS-CoV-2 target nucleic acids are NOT DETECTED. The SARS-CoV-2 RNA is generally detectable in upper and lower respiratory specimens during the acute phase of infection. Negative results do not preclude SARS-CoV-2 infection, do not rule out co-infections with other pathogens, and should not be used as the sole basis for treatment or other  patient management decisions. Negative results must be combined with clinical observations, patient history, and epidemiological information. The expected result is Negative. Fact Sheet for Patients: SugarRoll.be Fact Sheet for Healthcare Providers: https://www.woods-mathews.com/ This test is not yet approved or cleared by the [place] FDA and  has been authorized for detection and/or diagnosis of SARS-CoV-2 by FDA under an Emergency Use Authorization (EUA). This EUA will remain  in effect (meaning this test can be used) for the duration of the COVID-19 declaration under Section 56 4(b)(1) of the Act, 21 U.S.C. section 360bbb-3(b)(1), unless the authorization is terminated or revoked sooner. Performed at [facility], [place] [address], [place], [place] [address]     Procedures/Studies: DG Chest 2 View  Result Date: 12/14/2019 CLINICAL DATA:  Altered mental status, shortness of breath EXAM: CHEST - 2 VIEW COMPARISON:  10/01/2019 FINDINGS: Mild cardiomegaly. Atherosclerotic calcification of the aortic knob. Low lung volumes with crowding of the central bronchovascular markings. Interstitial prominence may reflect mild edema. No pneumothorax or pleural effusion is evident. IVC filter is noted. IMPRESSION: Mild cardiomegaly. Low lung volumes with crowding of the central bronchovascular markings and interstitial prominence may reflect mild edema.  Electronically Signed   By: Davina Poke D.O.   On: 12/14/2019 12:49   DG Abd 1 View  Result Date: 12/16/2019 CLINICAL DATA:  Altered mental status. Clearing for cranial MRI. EXAM: ABDOMEN - 1 VIEW COMPARISON:  Abdominopelvic CT 12/24/2005. FINDINGS: The bowel gas pattern is normal. There is no evidence of bowel wall thickening or free intraperitoneal air on supine radiography. IVC filter is in place, unchanged in position from previous CT. No other demonstrated foreign bodies. Aortic and splenic artery atherosclerosis. There are diffuse degenerative changes throughout the lumbar spine. IMPRESSION: 1. No acute findings or evidence of bowel obstruction. 2. IVC filter appears unchanged in position from prior CT. Electronically Signed   By: Richardean Sale M.D.   On: 12/16/2019 14:51   CT Head Wo Contrast  Result Date: 12/14/2019 CLINICAL DATA:  Altered mental status EXAM: CT HEAD WITHOUT CONTRAST TECHNIQUE: Contiguous axial images were obtained from the base of the skull through the vertex without intravenous contrast. COMPARISON:  09/17/2017 FINDINGS: Brain: Chronic atrophic and ischemic changes are noted. No findings to suggest acute hemorrhage, acute infarction or space-occupying mass lesion are noted. Vascular: No hyperdense vessel or unexpected calcification. Skull: Normal. Negative for fracture or focal lesion. Sinuses/Orbits: No acute finding. Other: None. IMPRESSION: Chronic atrophic and ischemic changes similar to that seen on prior exam. No acute abnormality is noted. Electronically Signed   By: Inez Catalina M.D.   On: 12/14/2019 13:03   MR BRAIN WO CONTRAST  Result Date: 12/16/2019 CLINICAL DATA:  84 year old female with altered mental status, encephalopathy. EXAM: MRI HEAD WITHOUT CONTRAST TECHNIQUE: Multiplanar, multiecho pulse sequences of the brain and surrounding structures were obtained without intravenous contrast. COMPARISON:  Head CT 12/14/2019. Brain MRI 06/21/2014. FINDINGS: Brain:  No restricted diffusion to suggest acute infarction. No midline shift, mass effect, evidence of mass lesion, ventriculomegaly, extra-axial collection or acute intracranial hemorrhage. Cervicomedullary junction and pituitary are within normal limits. Chronic but progressed since 2015 and now widespread bilateral cerebral white matter T2 and FLAIR hyperintensity. Associated T2 heterogeneity throughout the bilateral deep gray nuclei, probably representing a combination of perivascular spaces and chronic lacunar infarcts. There are occasional chronic micro hemorrhages in the brain, including in the left thalamus. No cortical encephalomalacia is identified. Mild to moderate T2 heterogeneity in the pons. Cerebellum appears negative.  Vascular: Major intracranial vascular flow voids are stable since 2015. Skull and upper cervical spine: Negative for age visible cervical spine, bone marrow signal. Sinuses/Orbits: Stable and negative. Other: A mild left mastoid effusion has resolved since 2015. Visible internal auditory structures appear normal. Scalp and face soft tissues appear negative. IMPRESSION: 1. No acute intracranial abnormality. 2. Chronic but progressed since 2015 signal changes in the brain compatible with fairly severe chronic small vessel disease. Electronically Signed   By: Genevie Ann M.D.   On: 12/16/2019 18:49    Labs: BNP (last 3 results) No results for input(s): BNP in the last 8760 hours. Basic Metabolic Panel: Recent Labs  Lab 12/14/19 1214 12/15/19 0329 12/16/19 1430  NA 141 139 136  K 4.4 4.2 4.0  CL 104 104 102  CO2 27 26 24   GLUCOSE 104* 142* 152*  BUN 18 18 16   CREATININE 1.45* 1.21* 1.11*  CALCIUM 8.6* 8.3* 8.4*   Liver Function Tests: Recent Labs  Lab 12/14/19 1214 12/16/19 1430  AST 20 50*  ALT 15 51*  ALKPHOS 71 106  BILITOT 0.6 0.9  PROT 6.8 6.3*  ALBUMIN 3.3* 2.7*   No results for input(s): LIPASE, AMYLASE in the last 168 hours. Recent Labs  Lab 12/15/19 0329   AMMONIA 29   CBC: Recent Labs  Lab 12/14/19 1214 12/15/19 0329 12/16/19 1430  WBC 5.7 4.4 7.9  NEUTROABS 4.9  --   --   HGB 12.4 11.8* 12.1  HCT 40.8 38.6 38.4  MCV 99.3 97.5 96.2  PLT 169 141* 153   Cardiac Enzymes: No results for input(s): CKTOTAL, CKMB, CKMBINDEX, TROPONINI in the last 168 hours. BNP: Invalid input(s): POCBNP CBG: Recent Labs  Lab 12/17/19 0628 12/17/19 1136 12/17/19 1629 12/17/19 2101 12/18/19 0601  GLUCAP 132* 180* 118* 128* 118*   D-Dimer No results for input(s): DDIMER in the last 72 hours. Hgb A1c No results for input(s): HGBA1C in the last 72 hours. Lipid Profile No results for input(s): CHOL, HDL, LDLCALC, TRIG, CHOLHDL, LDLDIRECT in the last 72 hours. Thyroid function studies No results for input(s): TSH, T4TOTAL, T3FREE, THYROIDAB in the last 72 hours.  Invalid input(s): FREET3 Anemia work up No results for input(s): VITAMINB12, FOLATE, FERRITIN, TIBC, IRON, RETICCTPCT in the last 72 hours. Urinalysis    Component Value Date/Time   COLORURINE YELLOW 12/14/2019 1520   APPEARANCEUR HAZY (A) 12/14/2019 1520   LABSPEC 1.015 12/14/2019 1520   PHURINE 6.0 12/14/2019 1520   GLUCOSEU NEGATIVE 12/14/2019 1520   GLUCOSEU NEGATIVE 06/12/2014 1410   HGBUR MODERATE (A) 12/14/2019 1520   BILIRUBINUR NEGATIVE 12/14/2019 1520   BILIRUBINUR Neg 06/19/2016 1101   KETONESUR NEGATIVE 12/14/2019 1520   PROTEINUR 30 (A) 12/14/2019 1520   UROBILINOGEN >=8.0 06/19/2016 1101   UROBILINOGEN 0.2 05/31/2015 1835   NITRITE POSITIVE (A) 12/14/2019 1520   LEUKOCYTESUR SMALL (A) 12/14/2019 1520   Sepsis Labs Invalid input(s): PROCALCITONIN,  WBC,  LACTICIDVEN Microbiology Recent Results (from the past 240 hour(s))  Culture, blood (routine x 2)     Status: None (Preliminary result)   Collection Time: 12/14/19  1:51 PM   Specimen: BLOOD RIGHT FOREARM  Result Value Ref Range Status   Specimen Description BLOOD RIGHT FOREARM  Final   Special Requests    Final    BOTTLES DRAWN AEROBIC ONLY Blood Culture results may not be optimal due to an inadequate volume of blood received in culture bottles   Culture   Final    NO GROWTH 4  DAYS Performed at [facility], [place] [address], [place], [place] [address]    Report Status PENDING  Incomplete  Culture, blood (routine x 2)     Status: None (Preliminary result)   Collection Time: 12/14/19  2:19 PM   Specimen: BLOOD  Result Value Ref Range Status   Specimen Description BLOOD RIGHT ANTECUBITAL  Final   Special Requests   Final    BOTTLES DRAWN AEROBIC ONLY Blood Culture adequate volume   Culture   Final    NO GROWTH 4 DAYS Performed at [facility], [place] [address], [place], [place] [address]    Report Status PENDING  Incomplete  Urine culture     Status: Abnormal   Collection Time: 12/14/19  3:20 PM   Specimen: Urine, Catheterized  Result Value Ref Range Status   Specimen Description URINE, CATHETERIZED  Final   Special Requests   Final    NONE Performed at [facility], [address], [place], [place] [address]    Culture >=100,000 COLONIES/mL ESCHERICHIA COLI (A)  Final   Report Status 12/16/2019 FINAL  Final   Organism ID, Bacteria ESCHERICHIA COLI (A)  Final      Susceptibility   Escherichia coli - MIC*    AMPICILLIN <=2 SENSITIVE Sensitive     CEFAZOLIN <=4 SENSITIVE Sensitive     CEFTRIAXONE <=0.25 SENSITIVE Sensitive     CIPROFLOXACIN <=0.25 SENSITIVE Sensitive     GENTAMICIN <=1 SENSITIVE Sensitive     IMIPENEM <=0.25 SENSITIVE Sensitive     NITROFURANTOIN <=16 SENSITIVE Sensitive     TRIMETH/SULFA <=20 SENSITIVE Sensitive     AMPICILLIN/SULBACTAM <=2 SENSITIVE Sensitive     PIP/TAZO <=4 SENSITIVE Sensitive     * >=100,000 COLONIES/mL ESCHERICHIA COLI  Respiratory Panel by RT PCR (Flu A&B, Covid) - Nasopharyngeal Swab     Status: None   Collection Time: 12/14/19  3:33 PM   Specimen: Nasopharyngeal Swab  Result Value Ref Range Status   SARS Coronavirus 2  by RT PCR NEGATIVE NEGATIVE Final    Comment: (NOTE) SARS-CoV-2 target nucleic acids are NOT DETECTED. The SARS-CoV-2 RNA is generally detectable in upper respiratoy specimens during the acute phase of infection. The lowest concentration of SARS-CoV-2 viral copies this assay can detect is 131 copies/mL. A negative result does not preclude SARS-Cov-2 infection and should not be used as the sole basis for treatment or other patient management decisions. A negative result may occur with  improper specimen collection/handling, submission of specimen other than nasopharyngeal swab, presence of viral mutation(s) within the areas targeted by this assay, and inadequate number of viral copies (<131 copies/mL). A negative result must be combined with clinical observations, patient history, and epidemiological information. The expected result is Negative. Fact Sheet for Patients:  PinkCheek.be Fact Sheet for Healthcare Providers:  GravelBags.it This test is not yet ap proved or cleared by the [place] FDA and  has been authorized for detection and/or diagnosis of SARS-CoV-2 by FDA under an Emergency Use Authorization (EUA). This EUA will remain  in effect (meaning this test can be used) for the duration of the COVID-19 declaration under Section 564(b)(1) of the Act, 21 U.S.C. section 360bbb-3(b)(1), unless the authorization is terminated or revoked sooner.    Influenza A by PCR NEGATIVE NEGATIVE Final   Influenza B by PCR NEGATIVE NEGATIVE Final    Comment: (NOTE) The Xpert Xpress SARS-CoV-2/FLU/RSV assay is intended as an aid in  the diagnosis of influenza from Nasopharyngeal  swab specimens and  should not be used as a sole basis for treatment. Nasal washings and  aspirates are unacceptable for Xpert Xpress SARS-CoV-2/FLU/RSV  testing. Fact Sheet for Patients: PinkCheek.be Fact Sheet for Healthcare  Providers: GravelBags.it This test is not yet approved or cleared by the [place] FDA and  has been authorized for detection and/or diagnosis of SARS-CoV-2 by  FDA under an Emergency Use Authorization (EUA). This EUA will remain  in effect (meaning this test can be used) for the duration of the  Covid-19 declaration under Section 564(b)(1) of the Act, 21  U.S.C. section 360bbb-3(b)(1), unless the authorization is  terminated or revoked. Performed at [facility], [place] [address] [place], [place] [address]   SARS CORONAVIRUS 2 (TAT 6-24 HRS) Nasopharyngeal Nasopharyngeal Swab     Status: None   Collection Time: 12/17/19  2:50 PM   Specimen: Nasopharyngeal Swab  Result Value Ref Range Status   SARS Coronavirus 2 NEGATIVE NEGATIVE Final    Comment: (NOTE) SARS-CoV-2 target nucleic acids are NOT DETECTED. The SARS-CoV-2 RNA is generally detectable in upper and lower respiratory specimens during the acute phase of infection. Negative results do not preclude SARS-CoV-2 infection, do not rule out co-infections with other pathogens, and should not be used as the sole basis for treatment or other patient management decisions. Negative results must be combined with clinical observations, patient history, and epidemiological information. The expected result is Negative. Fact Sheet for Patients: SugarRoll.be Fact Sheet for Healthcare Providers: https://www.woods-mathews.com/ This test is not yet approved or cleared by the [place] FDA and  has been authorized for detection and/or diagnosis of SARS-CoV-2 by FDA under an Emergency Use Authorization (EUA). This EUA will remain  in effect (meaning this test can be used) for the duration of the COVID-19 declaration under Section 56 4(b)(1) of the Act, 21 U.S.C. section 360bbb-3(b)(1), unless the authorization is terminated or revoked sooner. Performed at [facility], [place] [address] [place], [place] [address]    Time coordinating discharge:  25 minutes  SIGNED: Antonieta Pert, MD  [facility] 12/18/2019, 9:32 AM  If 7PM-7AM, please contact night-coverage [email or web address]

## 2019-12-18 NOTE — TOC Transition Note (Signed)
Transition of Care [facility]) - CM/SW Discharge Note   Patient Details  Name: Dawn Morrow MRN: [id number] Date of Birth: 1935-12-26  Transition of Care [facility]) CM/SW Contact:  Geralynn Ochs, LCSW Phone Number: 12/18/2019, 11:49 AM   Clinical Narrative:   Nurse to call report to [phone], ask for [facility]    Final next level of care: Assisted Living Barriers to Discharge: Barriers Resolved   Patient Goals and CMS Choice   CMS Medicare.gov Compare Post Acute Care list provided to:: Patient Choice offered to / list presented to : Patient  Discharge Placement              Patient chooses bed at: [facility]) Patient to be transferred to facility by: [place] Name of family member notified: Left voicemail for [facility] Patient and family notified of of transfer: 12/18/19  Discharge Plan and Services                                     Social Determinants of Health (SDOH) Interventions     Readmission Risk Interventions Readmission Risk Prevention Plan 10/03/2019  Transportation Screening Complete  PCP or Specialist Appt within 3-5 Days Complete  HRI or [place] Not Complete  HRI or Home Care Consult comments Patient at her baseline  Social Work Consult for [place] Planning/Counseling [place] Not Applicable  Medication Review Press photographer) Referral to Pharmacy  Some recent data might be hidden

## 2019-12-19 LAB — CULTURE, BLOOD (ROUTINE X 2)
Culture: NO GROWTH
Culture: NO GROWTH
Special Requests: ADEQUATE

## 2020-03-26 ENCOUNTER — Emergency Department (HOSPITAL_COMMUNITY): Payer: Medicare Other

## 2020-03-26 ENCOUNTER — Inpatient Hospital Stay (HOSPITAL_COMMUNITY)
Admission: EM | Admit: 2020-03-26 | Discharge: 2020-04-04 | DRG: 689 | Disposition: A | Discharge status: Skilled Nursing Facility | Payer: Medicare Other | Source: Skilled Nursing Facility | Attending: Internal Medicine | Admitting: Internal Medicine

## 2020-03-26 DIAGNOSIS — E1122 Type 2 diabetes mellitus with diabetic chronic kidney disease: Secondary | ICD-10-CM | POA: Diagnosis present

## 2020-03-26 DIAGNOSIS — E1159 Type 2 diabetes mellitus with other circulatory complications: Secondary | ICD-10-CM | POA: Diagnosis not present

## 2020-03-26 DIAGNOSIS — D72829 Elevated white blood cell count, unspecified: Secondary | ICD-10-CM

## 2020-03-26 DIAGNOSIS — Z87891 Personal history of nicotine dependence: Secondary | ICD-10-CM

## 2020-03-26 DIAGNOSIS — F329 Major depressive disorder, single episode, unspecified: Secondary | ICD-10-CM | POA: Diagnosis present

## 2020-03-26 DIAGNOSIS — Z7901 Long term (current) use of anticoagulants: Secondary | ICD-10-CM

## 2020-03-26 DIAGNOSIS — R609 Edema, unspecified: Secondary | ICD-10-CM

## 2020-03-26 DIAGNOSIS — Z79899 Other long term (current) drug therapy: Secondary | ICD-10-CM

## 2020-03-26 DIAGNOSIS — Z86711 Personal history of pulmonary embolism: Secondary | ICD-10-CM

## 2020-03-26 DIAGNOSIS — B952 Enterococcus as the cause of diseases classified elsewhere: Secondary | ICD-10-CM | POA: Diagnosis present

## 2020-03-26 DIAGNOSIS — Z8673 Personal history of transient ischemic attack (TIA), and cerebral infarction without residual deficits: Secondary | ICD-10-CM

## 2020-03-26 DIAGNOSIS — E039 Hypothyroidism, unspecified: Secondary | ICD-10-CM | POA: Diagnosis present

## 2020-03-26 DIAGNOSIS — G9341 Metabolic encephalopathy: Secondary | ICD-10-CM | POA: Diagnosis present

## 2020-03-26 DIAGNOSIS — N1831 Chronic kidney disease, stage 3a: Secondary | ICD-10-CM | POA: Diagnosis present

## 2020-03-26 DIAGNOSIS — N3001 Acute cystitis with hematuria: Secondary | ICD-10-CM

## 2020-03-26 DIAGNOSIS — Z885 Allergy status to narcotic agent status: Secondary | ICD-10-CM

## 2020-03-26 DIAGNOSIS — E119 Type 2 diabetes mellitus without complications: Secondary | ICD-10-CM

## 2020-03-26 DIAGNOSIS — Z86718 Personal history of other venous thrombosis and embolism: Secondary | ICD-10-CM

## 2020-03-26 DIAGNOSIS — M8580 Other specified disorders of bone density and structure, unspecified site: Secondary | ICD-10-CM | POA: Diagnosis present

## 2020-03-26 DIAGNOSIS — Z9981 Dependence on supplemental oxygen: Secondary | ICD-10-CM

## 2020-03-26 DIAGNOSIS — Z993 Dependence on wheelchair: Secondary | ICD-10-CM

## 2020-03-26 DIAGNOSIS — I482 Chronic atrial fibrillation, unspecified: Secondary | ICD-10-CM

## 2020-03-26 DIAGNOSIS — N390 Urinary tract infection, site not specified: Principal | ICD-10-CM | POA: Diagnosis present

## 2020-03-26 DIAGNOSIS — F419 Anxiety disorder, unspecified: Secondary | ICD-10-CM | POA: Diagnosis present

## 2020-03-26 DIAGNOSIS — J449 Chronic obstructive pulmonary disease, unspecified: Secondary | ICD-10-CM | POA: Diagnosis not present

## 2020-03-26 DIAGNOSIS — R4182 Altered mental status, unspecified: Secondary | ICD-10-CM | POA: Diagnosis not present

## 2020-03-26 DIAGNOSIS — I10 Essential (primary) hypertension: Secondary | ICD-10-CM | POA: Diagnosis present

## 2020-03-26 DIAGNOSIS — I1310 Hypertensive heart and chronic kidney disease without heart failure, with stage 1 through stage 4 chronic kidney disease, or unspecified chronic kidney disease: Secondary | ICD-10-CM | POA: Diagnosis present

## 2020-03-26 DIAGNOSIS — Z882 Allergy status to sulfonamides status: Secondary | ICD-10-CM

## 2020-03-26 DIAGNOSIS — Z809 Family history of malignant neoplasm, unspecified: Secondary | ICD-10-CM

## 2020-03-26 DIAGNOSIS — Z833 Family history of diabetes mellitus: Secondary | ICD-10-CM

## 2020-03-26 DIAGNOSIS — K219 Gastro-esophageal reflux disease without esophagitis: Secondary | ICD-10-CM | POA: Diagnosis present

## 2020-03-26 DIAGNOSIS — K449 Diaphragmatic hernia without obstruction or gangrene: Secondary | ICD-10-CM | POA: Diagnosis present

## 2020-03-26 DIAGNOSIS — E785 Hyperlipidemia, unspecified: Secondary | ICD-10-CM | POA: Diagnosis present

## 2020-03-26 DIAGNOSIS — R627 Adult failure to thrive: Secondary | ICD-10-CM | POA: Diagnosis present

## 2020-03-26 DIAGNOSIS — M1990 Unspecified osteoarthritis, unspecified site: Secondary | ICD-10-CM | POA: Diagnosis present

## 2020-03-26 DIAGNOSIS — N179 Acute kidney failure, unspecified: Secondary | ICD-10-CM

## 2020-03-26 DIAGNOSIS — Z880 Allergy status to penicillin: Secondary | ICD-10-CM

## 2020-03-26 DIAGNOSIS — Z79890 Hormone replacement therapy (postmenopausal): Secondary | ICD-10-CM

## 2020-03-26 DIAGNOSIS — J9611 Chronic respiratory failure with hypoxia: Secondary | ICD-10-CM | POA: Diagnosis present

## 2020-03-26 DIAGNOSIS — Z6835 Body mass index (BMI) 35.0-35.9, adult: Secondary | ICD-10-CM

## 2020-03-26 DIAGNOSIS — Z20822 Contact with and (suspected) exposure to covid-19: Secondary | ICD-10-CM | POA: Diagnosis present

## 2020-03-26 DIAGNOSIS — Z8616 Personal history of COVID-19: Secondary | ICD-10-CM

## 2020-03-26 LAB — CBC WITH DIFFERENTIAL/PLATELET
Abs Immature Granulocytes: 0.05 10*3/uL (ref 0.00–0.07)
Basophils Absolute: 0.1 10*3/uL (ref 0.0–0.1)
Basophils Relative: 0 %
Eosinophils Absolute: 0.2 10*3/uL (ref 0.0–0.5)
Eosinophils Relative: 2 %
HCT: 42.2 % (ref 36.0–46.0)
Hemoglobin: 12.7 g/dL (ref 12.0–15.0)
Immature Granulocytes: 0 %
Lymphocytes Relative: 12 %
Lymphs Abs: 1.5 10*3/uL (ref 0.7–4.0)
MCH: 29 pg (ref 26.0–34.0)
MCHC: 30.1 g/dL (ref 30.0–36.0)
MCV: 96.3 fL (ref 80.0–100.0)
Monocytes Absolute: 0.9 10*3/uL (ref 0.1–1.0)
Monocytes Relative: 7 %
Neutro Abs: 10 10*3/uL — ABNORMAL HIGH (ref 1.7–7.7)
Neutrophils Relative %: 79 %
Platelets: 162 10*3/uL (ref 150–400)
RBC: 4.38 MIL/uL (ref 3.87–5.11)
RDW: 17.6 % — ABNORMAL HIGH (ref 11.5–15.5)
WBC: 12.7 10*3/uL — ABNORMAL HIGH (ref 4.0–10.5)
nRBC: 0 % (ref 0.0–0.2)

## 2020-03-26 LAB — TSH: TSH: 5.32 u[IU]/mL — ABNORMAL HIGH (ref 0.350–4.500)

## 2020-03-26 LAB — PROTIME-INR
INR: 1.3 — ABNORMAL HIGH (ref 0.8–1.2)
Prothrombin Time: 15.9 seconds — ABNORMAL HIGH (ref 11.4–15.2)

## 2020-03-26 LAB — COMPREHENSIVE METABOLIC PANEL
ALT: 15 U/L (ref 0–44)
AST: 19 U/L (ref 15–41)
Albumin: 3.2 g/dL — ABNORMAL LOW (ref 3.5–5.0)
Alkaline Phosphatase: 75 U/L (ref 38–126)
Anion gap: 9 (ref 5–15)
BUN: 26 mg/dL — ABNORMAL HIGH (ref 8–23)
CO2: 29 mmol/L (ref 22–32)
Calcium: 8.8 mg/dL — ABNORMAL LOW (ref 8.9–10.3)
Chloride: 102 mmol/L (ref 98–111)
Creatinine, Ser: 1.8 mg/dL — ABNORMAL HIGH (ref 0.44–1.00)
GFR calc Af Amer: 30 mL/min — ABNORMAL LOW (ref >60)
GFR calc non Af Amer: 26 mL/min — ABNORMAL LOW (ref >60)
Glucose, Bld: 113 mg/dL — ABNORMAL HIGH (ref 70–99)
Potassium: 4 mmol/L (ref 3.5–5.1)
Sodium: 140 mmol/L (ref 135–145)
Total Bilirubin: 1.1 mg/dL (ref 0.3–1.2)
Total Protein: 7.2 g/dL (ref 6.5–8.1)

## 2020-03-26 LAB — URINALYSIS, ROUTINE W REFLEX MICROSCOPIC
Bilirubin Urine: NEGATIVE
Glucose, UA: NEGATIVE mg/dL
Ketones, ur: NEGATIVE mg/dL
Nitrite: NEGATIVE
Protein, ur: 30 mg/dL — AB
Specific Gravity, Urine: 1.019 (ref 1.005–1.030)
pH: 5 (ref 5.0–8.0)

## 2020-03-26 LAB — CBG MONITORING, ED: Glucose-Capillary: 109 mg/dL — ABNORMAL HIGH (ref 70–99)

## 2020-03-26 LAB — SARS CORONAVIRUS 2 BY RT PCR (HOSPITAL ORDER, PERFORMED IN ~~LOC~~ HOSPITAL LAB): SARS Coronavirus 2: NEGATIVE

## 2020-03-26 MED ORDER — SODIUM CHLORIDE 0.9 % IV BOLUS
500.0000 mL | Freq: Once | INTRAVENOUS | Status: AC
  Administered 2020-03-26: 500 mL via INTRAVENOUS

## 2020-03-26 MED ORDER — SODIUM CHLORIDE 0.9 % IV SOLN
2.0000 g | Freq: Three times a day (TID) | INTRAVENOUS | Status: DC
  Administered 2020-03-26 – 2020-03-27 (×4): 2 g via INTRAVENOUS
  Filled 2020-03-26 (×8): qty 2

## 2020-03-26 NOTE — ED Notes (Signed)
Call Dawn Morrow to give updates

## 2020-03-26 NOTE — ED Provider Notes (Cosign Needed)
[facility] EMERGENCY DEPARTMENT Provider Note   CSN: [id number] Arrival date & time: 03/26/20  1352     History Chief Complaint  Patient presents with  . Urinary Tract Infection  . Altered Mental Status    Dawn Morrow is a 84 y.o. female.  HPI  LEVEL 5 CAVEAT 8/15 AMS 84 year old female with a history of COPD, DM type II, DVT, hypertension, hyperthyroidism, PE x2, stroke presents to the ER for altered mental status from [facility] facility.  On arrival, patient is alert to person and place, but thinks it is 87.  History provided largely from nursing staff from [place].  They note that over the last 3 days, the patient has had significant cognitive decline.  Normally the patient is alert and oriented x3, is not ambulatory on her own, but is wheelchair-bound and is able to ambulate with a wheelchair with no difficulty.  Over the last few days, nursing staff is noted that she has not been getting out of bed, and as of this morning, required 3 people to transfer her to her wheelchair.  They have not noted any unilateral weakness or deficits.  They deny that she has had any fevers or chills.  She is anticoagulated on Xarelto, no recent falls or head injuries.  They also note that she has not been eating or drinking.   Past Medical History:  Diagnosis Date  . Anxiety and depression   . Asthma   . Cataract   . COPD (chronic obstructive pulmonary disease) ([place])   . Diabetes mellitus   . Diverticulosis   . DVT (deep vein thrombosis) in pregnancy    hx x multiple on coumadin  . GERD (gastroesophageal reflux disease)   . Hemorrhoids   . Hiatal hernia   . Hyperlipidemia   . Hypertension   . Hypothyroidism   . On home oxygen therapy    "2L; 24/7" (09/17/2017)  . Osteoarthritis    h/o spinal stenosis by MRI 2009  . Osteopenia   . Pulmonary embolism (H[facility])    x 2  . Stroke [facility])    MINI  . Tubular adenoma of colon 07/2005    Patient Active Problem List    Diagnosis Date Noted  . Sepsis ([place]) 12/14/2019  . Acute encephalopathy 12/14/2019  . COVID-19 virus infection 10/02/2019  . Acute lower UTI 10/02/2019  . Acute kidney failure ([place]) 10/01/2019  . Syncope 09/18/2017  . Near syncope 09/17/2017  . Diabetes mellitus type 2 in obese ([place]) 09/17/2017  . Atrial fibrillation with RVR ([place]) 09/17/2017  . Hypomagnesemia 09/17/2017  . CKD (chronic kidney disease), stage III 09/17/2017  . Forehead contusion   . MGUS (monoclonal gammopathy of unknown significance) 08/13/2016  . Renal failure 07/09/2015  . Follow-up ---------PCP NOTES 05/17/2015  . Essential tremor 12/28/2014  . Ulnar neuropathy of left upper extremity 12/28/2014  . Intractable pain 06/21/2014  . Back pain 06/21/2014  . Lumbar radiculopathy 06/21/2014  . Encounter for therapeutic drug monitoring 03/14/2014  . Numbness 10/18/2013  . TIA (transient ischemic attack) 09/19/2013  . Weakness 09/18/2013  . Annual physical exam 05/30/2012  . Tremor 01/21/2012  . Failure to thrive and poor med compliance  01/21/2012  . PE (pulmonary embolism) 11/18/2010  . DVT (deep venous thrombosis) ([place]) 11/18/2010  . Long term current use of anticoagulant 11/18/2010  . ABNORMAL ELECTROCARDIOGRAM 11/10/2010  . OSTEOARTHRITIS 08/06/2010  . DIZZINESS 04/15/2010  . Diabetes ([place]) 06/04/2009  . ACNE ROSACEA 11/28/2008  . BACK  PAIN 05/16/2008  . HIP PAIN, RIGHT, CHRONIC 09/15/2007  . Hypothyroidism 09/13/2007  . Dyslipidemia 09/13/2007  . Osteoporosis 07/22/2007  . DEPRESSION 09/11/2006  . HTN (hypertension) 09/11/2006  . ASTHMA 09/11/2006  . COPD (chronic obstructive pulmonary disease) ([place]) 09/11/2006  . GERD 09/11/2006  . Recurrent UTI 09/11/2006  . [place]EN[place] FILTER INSERTION, HX OF 09/11/2006    Past Surgical History:  Procedure Laterality Date  . APPENDECTOMY    . CATARACT EXTRACTION    . CHOLECYSTECTOMY    . LUMBAR FUSION    . POLYPECTOMY    . TONSILLECTOMY    . TUBAL LIGATION        OB History   No obstetric history on file.     Family History  Adopted: Yes  Problem Relation Age of Onset  . Cancer Mother        ? colon or ovarian  . Diabetes Mother   . Cancer Brother        ?  . CAD Neg Hx     Social History   Tobacco Use  . Smoking status: Former Smoker    Types: Cigarettes  . Smokeless tobacco: Never Used  Substance Use Topics  . Alcohol use: No    Comment: socially  . Drug use: No    Home Medications Prior to Admission medications   Medication Sig Start Date End Date Taking? Authorizing Provider  acetaminophen (TYLENOL) 500 MG tablet Take 500 mg by mouth every 4 (four) hours as needed for mild pain or headache.     [provider]  albuterol (PROVENTIL HFA;VENTOLIN HFA) 108 (90 BASE) MCG/ACT inhaler Inhale 2 puffs into the lungs every 6 (six) hours as needed for wheezing or shortness of breath. 08/05/15   Saguier, Percell Miller, PA-C  aspirin-acetaminophen-caffeine (EXCEDRIN MIGRAINE) [phone] MG per tablet Take 2 tablets by mouth every 6 (six) hours as needed for headache (headache). Reported on 10/09/2015    [provider]  atorvastatin (LIPITOR) 10 MG tablet Take 1 tablet (10 mg total) by mouth at bedtime. 10/21/15   Colon Branch, MD  buPROPion HCl ER, XL, 450 MG TB24 Take 1 tablet by mouth at bedtime. 11/20/19   [provider]  carvedilol (COREG) 6.25 MG tablet Take 1 tablet (6.25 mg total) by mouth 2 (two) times daily with a meal. 12/18/19 01/17/20  Antonieta Pert, MD  ciprofloxacin (CIPRO) 250 MG tablet Take 250 mg by mouth 2 (two) times daily. 01/17/20   [provider]  docusate sodium (COLACE) 100 MG capsule Take 100 mg by mouth daily.    [provider]  ezetimibe (ZETIA) 10 MG tablet Take 10 mg by mouth daily.    [provider]  gabapentin (NEURONTIN) 300 MG capsule Take 1 cap in AM, 1 cap at noon, 2 caps at bedtime Patient taking differently: Take 300-600 mg by mouth See admin instructions.  Take 1 capsule every morning and afternoon then take 2 capsules at bedtime. 12/21/14   Cameron Sprang, MD  ketoconazole (NIZORAL) 2 % shampoo Apply 1 application topically 2 (two) times a week.    [provider]  levothyroxine (SYNTHROID) 88 MCG tablet Take 88 mcg by mouth daily before breakfast.    [provider]  linagliptin (TRADJENTA) 5 MG TABS tablet Take 1 tablet (5 mg total) by mouth at bedtime. 07/15/16   Colon Branch, MD  LORazepam (ATIVAN) 0.5 MG tablet Take 0.5 mg by mouth every 8 (eight) hours as needed for anxiety.  [provider]  meclizine (ANTIVERT) 25 MG tablet Take 25 mg by mouth at bedtime as needed for dizziness.    [provider]  nystatin (NYSTATIN) powder Apply 1 application topically 2 (two) times daily.     [provider]  ondansetron (ZOFRAN) 4 MG tablet Take 4 mg by mouth every 8 (eight) hours as needed for nausea or vomiting.    [provider]  OXYGEN Inhale 2 L into the lungs continuous. As directed.     [provider]  polyethylene glycol (MIRALAX / GLYCOLAX) packet Take 17 g by mouth daily as needed for mild constipation. Reported on 10/09/2015    [provider]  Propylene Glycol (SYSTANE BALANCE) 0.6 % SOLN Place 1 drop into both eyes 4 (four) times daily as needed (for dry eyes).     [provider]  repaglinide (PRANDIN) 0.5 MG tablet Take 1 tablet (0.5 mg total) by mouth 3 (three) times daily before meals. 01/14/16   Colon Branch, MD  rivaroxaban (XARELTO) 20 MG TABS tablet Take 1 tablet (20 mg total) by mouth daily with supper. 09/14/14   Colon Branch, MD  sertraline (ZOLOFT) 50 MG tablet Take 1.5 tablets by mouth daily at 12 noon. 11/20/19   [provider]    Allergies    Penicillins, Codeine, and Sulfonamide derivatives  Review of Systems   Review of Systems  Unable to perform ROS: Mental status change    Physical Exam Updated Vital Signs BP 134/70 (BP Location:  Right Arm)   Pulse 62   Temp 97.8 F (36.6 C) (Oral)   Resp 14   SpO2 99%   Physical Exam Vitals and nursing note reviewed.  Constitutional:      General: She is not in acute distress.    Appearance: She is well-developed. She is not ill-appearing, toxic-appearing or diaphoretic.  HENT:     Head: Normocephalic and atraumatic.     Mouth/Throat:     Mouth: Mucous membranes are moist.     Pharynx: Oropharynx is clear.  Eyes:     Conjunctiva/sclera: Conjunctivae normal.  Cardiovascular:     Rate and Rhythm: Normal rate. Rhythm irregular.     Heart sounds: No murmur heard.   Pulmonary:     Effort: Pulmonary effort is normal. No respiratory distress.     Breath sounds: Normal breath sounds.  Abdominal:     Palpations: Abdomen is soft.     Tenderness: There is no abdominal tenderness.  Musculoskeletal:        General: No tenderness or deformity.     Cervical back: Neck supple.  Skin:    General: Skin is warm and dry.     Findings: No bruising.  Neurological:     General: No focal deficit present.     Mental Status: She is alert and oriented to person, place, and time.     Sensory: No sensory deficit.     Motor: No weakness.     Comments: Mental Status:  Alert to person and place but not time. Slightly slurred speech though per chart review this appears to be at baseline. Able to follow 2 step commands without difficulty.  Cranial Nerves:  II: Peripheral visual fields grossly normal, pupils equal, round, reactive to light III,IV, VI: ptosis not present, extra-ocular motions intact bilaterally  V,VII: smile symmetric, facial light touch sensation equal VIII: hearing grossly normal to voice  X: uvula elevates symmetrically  XI: bilateral shoulder shrug symmetric and strong  XII: midline tongue extension without fassiculations Motor:  Normal tone. 5/5 strength of BUE and BLE major muscle groups including strong and equal grip strength and dorsiflexion/plantar flexion. Moving  all 4 extremities without difficulty.  Sensory: light touch normal in all extremities. Cerebellar: normal finger-to-nose with bilateral upper extremities, Romberg sign absent Gait: patient not ambulatory at baseline    Psychiatric:        Mood and Affect: Mood normal.        Behavior: Behavior normal.     ED Results / Procedures / Treatments   Labs (all labs ordered are listed, but only abnormal results are displayed) Labs Reviewed  CBC WITH DIFFERENTIAL/PLATELET - Abnormal; Notable for the following components:      Result Value   WBC 12.7 (*)    RDW 17.6 (*)    Neutro Abs 10.0 (*)    All other components within normal limits  COMPREHENSIVE METABOLIC PANEL - Abnormal; Notable for the following components:   Glucose, Bld 113 (*)    BUN 26 (*)    Creatinine, Ser 1.80 (*)    Calcium 8.8 (*)    Albumin 3.2 (*)    GFR calc non Af Amer 26 (*)    GFR calc Af Amer 30 (*)    All other components within normal limits  TSH - Abnormal; Notable for the following components:   TSH 5.320 (*)    All other components within normal limits  CBG MONITORING, ED - Abnormal; Notable for the following components:   Glucose-Capillary 109 (*)    All other components within normal limits  URINALYSIS, ROUTINE W REFLEX MICROSCOPIC  PROTIME-INR    EKG EKG Interpretation  Date/Time:  Tuesday March 26 2020 14:50:15 EDT Ventricular Rate:  71 PR Interval:    QRS Duration: 97 QT Interval:  392 QTC Calculation: 426 R Axis:   -16 Text Interpretation: Atrial fibrillation Borderline left axis deviation Low voltage, precordial leads Consider anterior infarct Since last tracing rate slower Confirmed by Dorie Rank [phone]) on 03/26/2020 2:55:04 PM   Radiology DG Chest Portable 1 View  Result Date: 03/26/2020 CLINICAL DATA:  Altered mental status EXAM: PORTABLE CHEST 1 VIEW COMPARISON:  12/14/2019, 09/17/2017 FINDINGS: Cardiomegaly with mild central bronchovascular crowding. No pleural effusion or  consolidation. Aortic atherosclerosis. Hazy atelectasis at the right base. IMPRESSION: Cardiomegaly. Hazy atelectasis at the right base. Electronically Signed   By: Donavan Foil M.D.   On: 03/26/2020 15:32    Procedures Procedures (including critical care time)  Medications Ordered in ED Medications - No data to display  ED Course  I have reviewed the triage vital signs and the nursing notes.  Pertinent labs & imaging results that were available during my care of the patient were reviewed by me and considered in my medical decision making (see chart for details).    MDM Rules/Calculators/A&P                         84 year old female with altered mental status On presentation, the patient is alert to person and place, but not time.  No significant focal neuro deficits seen on exam.  Vitals overall reassuring.  Altered mental status work-up initiated with labs, CT of the head, chest x-ray, UA, PT/INR given anticoagulation.  TSH also ordered given patient's history of hypothyroidism.  CBC with leukocytosis of 12.7, normal hemoglobin.  CBG 109, UA pending.  CMP without any significant electrolyte abnormalities, creatinine of 1.8 which is increased from  the value of 3 months ago 1.11.  Chest x-ray with cardiomegaly, and hazy atelectasis at the right base.  EKG with A. fib, consistent with her previous presentation in April.  Care signed out to [facility] she will oversee the loss of her lab work and disposition the patient as indicated.  Suspect she will need admission given significant change in ambulatory status and failure to thrive.  Final Clinical Impression(s) / ED Diagnoses Final diagnoses:  None    Rx / DC Orders ED Discharge Orders    None       Garald Balding, PA-C 03/26/20 1547

## 2020-03-26 NOTE — ED Notes (Signed)
On assessment pt altered. A&OX 2, Slow to respond, not answering all questions appropriately.

## 2020-03-26 NOTE — ED Triage Notes (Addendum)
Pt to ED via EMS from [place] c/o "not acting herself" possible UTI Pt AxO x 4 with EMS. No medications given by EMS. Last VS : 122/64, hr 70, rr16, temp 97. cbg 169. 95% [place]@ 3L- pt home o2 user 3L- HX COPD.

## 2020-03-26 NOTE — ED Notes (Signed)
hospitalist at bedside

## 2020-03-26 NOTE — H&P (Signed)
History and Physical    Dawn Morrow MGN:[id number] DOB: July 28, 1936 DOA: 03/26/2020  PCP: Colon Branch, MD  Patient coming from: [facility]  I have personally briefly reviewed patient's old medical records in [place]  Chief Complaint: Altered mental status  HPI: Dawn Morrow is a 84 y.o. female with medical history significant for COPD with chronic hypoxemia on 3 L, atrial fibrillation, PE/DVT on Xarelto, hypertension, CKD stage 3a, type 2 diabetes, hypothyroidism, MGUS who presents with concerns of altered mental status.  Patient unable to provide any history as to why she is here in the ED.  Per ED documentation, nursing staff at her SNF thought that she was different from her baseline.  Needing more assistance with transfer.  She was last admitted to the hospital in April with altered mental status due to E. coli UTI.  ED Course: She was afebrile, normotensive and on her usual 3 L of O2.Mild leukocytes of 12.7.  BG of 113, Creatinine of 1.80 up from 1.11 three months ago. TSH elevated at 5.320  Negative head CT. CXR with cardiomegaly and hazy atelectasis at right base. UA shows trace leukocytes and negative nitrate with few bacteria.    Review of Systems:  Unable to obtain since patient is unable to provide much history  Past Medical History:  Diagnosis Date  . Anxiety and depression   . Asthma   . Cataract   . COPD (chronic obstructive pulmonary disease) ([place])   . Diabetes mellitus   . Diverticulosis   . DVT (deep vein thrombosis) in pregnancy    hx x multiple on coumadin  . GERD (gastroesophageal reflux disease)   . Hemorrhoids   . Hiatal hernia   . Hyperlipidemia   . Hypertension   . Hypothyroidism   . On home oxygen therapy    "2L; 24/7" (09/17/2017)  . Osteoarthritis    h/o spinal stenosis by MRI 2009  . Osteopenia   . Pulmonary embolism (HCC)    x 2  . Stroke [facility])    MINI  . Tubular adenoma of colon 07/2005    Past Surgical History:    Procedure Laterality Date  . APPENDECTOMY    . CATARACT EXTRACTION    . CHOLECYSTECTOMY    . LUMBAR FUSION    . POLYPECTOMY    . TONSILLECTOMY    . TUBAL LIGATION       reports that she has quit smoking. Her smoking use included cigarettes. She has never used smokeless tobacco. She reports that she does not drink alcohol and does not use drugs.  Allergies  Allergen Reactions  . Penicillins Anaphylaxis  . Codeine Other (See Comments)    unknown  . Sulfonamide Derivatives Other (See Comments)    unknown    Family History  Adopted: Yes  Problem Relation Age of Onset  . Cancer Mother        ? colon or ovarian  . Diabetes Mother   . Cancer Brother        ?  . CAD Neg Hx      Prior to Admission medications   Medication Sig Start Date End Date Taking? Authorizing Provider  acetaminophen (TYLENOL) 500 MG tablet Take 500 mg by mouth every 4 (four) hours as needed for mild pain or headache.     [provider]  albuterol (PROVENTIL HFA;VENTOLIN HFA) 108 (90 BASE) MCG/ACT inhaler Inhale 2 puffs into the lungs every 6 (six) hours as needed for wheezing or shortness  of breath. 08/05/15   Saguier, Percell Miller, PA-C  aspirin-acetaminophen-caffeine (EXCEDRIN MIGRAINE) [id number] MG per tablet Take 2 tablets by mouth every 6 (six) hours as needed for headache (headache). Reported on 10/09/2015    [provider]  atorvastatin (LIPITOR) 10 MG tablet Take 1 tablet (10 mg total) by mouth at bedtime. 10/21/15   Colon Branch, MD  buPROPion HCl ER, XL, 450 MG TB24 Take 1 tablet by mouth at bedtime. 11/20/19   [provider]  carvedilol (COREG) 6.25 MG tablet Take 1 tablet (6.25 mg total) by mouth 2 (two) times daily with a meal. 12/18/19 01/17/20  Antonieta Pert, MD  ciprofloxacin (CIPRO) 250 MG tablet Take 250 mg by mouth 2 (two) times daily. 01/17/20   [provider]  docusate sodium (COLACE) 100 MG capsule Take 100 mg by mouth daily.    [provider]   ezetimibe (ZETIA) 10 MG tablet Take 10 mg by mouth daily.    [provider]  gabapentin (NEURONTIN) 300 MG capsule Take 1 cap in AM, 1 cap at noon, 2 caps at bedtime Patient taking differently: Take 300-600 mg by mouth See admin instructions. Take 1 capsule every morning and afternoon then take 2 capsules at bedtime. 12/21/14   Cameron Sprang, MD  ketoconazole (NIZORAL) 2 % shampoo Apply 1 application topically 2 (two) times a week.    [provider]  levothyroxine (SYNTHROID) 88 MCG tablet Take 88 mcg by mouth daily before breakfast.    [provider]  linagliptin (TRADJENTA) 5 MG TABS tablet Take 1 tablet (5 mg total) by mouth at bedtime. 07/15/16   Colon Branch, MD  LORazepam (ATIVAN) 0.5 MG tablet Take 0.5 mg by mouth every 8 (eight) hours as needed for anxiety.    [provider]  meclizine (ANTIVERT) 25 MG tablet Take 25 mg by mouth at bedtime as needed for dizziness.    [provider]  nystatin (NYSTATIN) powder Apply 1 application topically 2 (two) times daily.     [provider]  ondansetron (ZOFRAN) 4 MG tablet Take 4 mg by mouth every 8 (eight) hours as needed for nausea or vomiting.    [provider]  OXYGEN Inhale 2 L into the lungs continuous. As directed.     [provider]  polyethylene glycol (MIRALAX / GLYCOLAX) packet Take 17 g by mouth daily as needed for mild constipation. Reported on 10/09/2015    [provider]  Propylene Glycol (SYSTANE BALANCE) 0.6 % SOLN Place 1 drop into both eyes 4 (four) times daily as needed (for dry eyes).     [provider]  repaglinide (PRANDIN) 0.5 MG tablet Take 1 tablet (0.5 mg total) by mouth 3 (three) times daily before meals. 01/14/16   Colon Branch, MD  rivaroxaban (XARELTO) 20 MG TABS tablet Take 1 tablet (20 mg total) by mouth daily with supper. 09/14/14   Colon Branch, MD  sertraline (ZOLOFT) 50 MG tablet Take 1.5 tablets by mouth daily at 12 noon.  11/20/19   [provider]    Physical Exam: Vitals:   03/26/20 1645 03/26/20 1705 03/26/20 1830 03/26/20 1845  BP: 131/72 131/72 (!) 165/85 (!) 143/86  Pulse: 66 70 63 68  Resp: 18 15 (!) 22 16  Temp:      TempSrc:      SpO2:  97% 96% 96%    Constitutional: NAD, calm, comfortable, elderly female nontoxic-appearing laying flat in bed Vitals:   03/26/20  1645 03/26/20 1705 03/26/20 1830 03/26/20 1845  BP: 131/72 131/72 (!) 165/85 (!) 143/86  Pulse: 66 70 63 68  Resp: 18 15 (!) 22 16  Temp:      TempSrc:      SpO2:  97% 96% 96%   Eyes: PERRL, lids and conjunctivae normal ENMT: Mucous membranes are moist.  Neck: normal, supple Respiratory: clear to auscultation bilaterally, no wheezing, no crackles. Normal respiratory effort on 3 L of O2 via nasal cannula. No accessory muscle use.  Cardiovascular: Regular rate and rhythm, no murmurs / rubs / gallops. No extremity edema. 2+ pedal pulses.  Abdomen: no tenderness, no masses palpated.Bowel sounds positive.  Musculoskeletal: no clubbing / cyanosis. No joint deformity upper and lower extremities. Good ROM, no contractures. Normal muscle tone.  Skin: no rashes, lesions, ulcers. No induration.  No skin tears or breakdown.  No wounds.. Neurologic: Alert and oriented only to self.  CN 2-12 grossly intact. Sensation intact, Strength 5/5 in all 4.  Able to follow simple commands. Psychiatric: Alert and oriented only to self.    Labs on Admission: I have personally reviewed following labs and imaging studies  CBC: Recent Labs  Lab 03/26/20 1414  WBC 12.7*  NEUTROABS 10.0*  HGB 12.7  HCT 42.2  MCV 96.3  PLT 448   Basic Metabolic Panel: Recent Labs  Lab 03/26/20 1414  NA 140  K 4.0  CL 102  CO2 29  GLUCOSE 113*  BUN 26*  CREATININE 1.80*  CALCIUM 8.8*   GFR: CrCl cannot be calculated (Unknown ideal weight.). Liver Function Tests: Recent Labs  Lab 03/26/20 1414  AST 19  ALT 15  ALKPHOS 75  BILITOT 1.1   PROT 7.2  ALBUMIN 3.2*   No results for input(s): LIPASE, AMYLASE in the last 168 hours. No results for input(s): AMMONIA in the last 168 hours. Coagulation Profile: Recent Labs  Lab 03/26/20 1518  INR 1.3*   Cardiac Enzymes: No results for input(s): CKTOTAL, CKMB, CKMBINDEX, TROPONINI in the last 168 hours. BNP (last 3 results) No results for input(s): PROBNP in the last 8760 hours. HbA1C: No results for input(s): HGBA1C in the last 72 hours. CBG: Recent Labs  Lab 03/26/20 1424  GLUCAP 109*   Lipid Profile: No results for input(s): CHOL, HDL, LDLCALC, TRIG, CHOLHDL, LDLDIRECT in the last 72 hours. Thyroid Function Tests: Recent Labs    03/26/20 1415  TSH 5.320*   Anemia Panel: No results for input(s): VITAMINB12, FOLATE, FERRITIN, TIBC, IRON, RETICCTPCT in the last 72 hours. Urine analysis:    Component Value Date/Time   COLORURINE YELLOW 03/26/2020 1705   APPEARANCEUR HAZY (A) 03/26/2020 1705   LABSPEC 1.019 03/26/2020 1705   PHURINE 5.0 03/26/2020 1705   GLUCOSEU NEGATIVE 03/26/2020 1705   GLUCOSEU NEGATIVE 06/12/2014 1410   HGBUR SMALL (A) 03/26/2020 1705   BILIRUBINUR NEGATIVE 03/26/2020 1705   BILIRUBINUR Neg 06/19/2016 1101   KETONESUR NEGATIVE 03/26/2020 1705   PROTEINUR 30 (A) 03/26/2020 1705   UROBILINOGEN >=8.0 06/19/2016 1101   UROBILINOGEN 0.2 05/31/2015 1835   NITRITE NEGATIVE 03/26/2020 1705   LEUKOCYTESUR TRACE (A) 03/26/2020 1705    Radiological Exams on Admission: CT Head Wo Contrast  Result Date: 03/26/2020 CLINICAL DATA:  Altered mental status EXAM: CT HEAD WITHOUT CONTRAST TECHNIQUE: Contiguous axial images were obtained from the base of the skull through the vertex without intravenous contrast. COMPARISON:  CT brain 12/14/2019, MRI brain 12/16/2019 FINDINGS: Brain: No acute territorial infarction, hemorrhage or intracranial mass. Marked hypodensity  in the white matter consistent with chronic small vessel ischemic change. Chronic  appearing lacunar infarcts in the basal ganglia. Stable ventricle size. Vascular: No hyperdense vessels.  Carotid vascular calcification. Skull: Normal. Negative for fracture or focal lesion. Sinuses/Orbits: No acute finding. Other: None IMPRESSION: 1. No CT evidence for acute intracranial abnormality. 2. Chronic small vessel ischemic changes of the white matter. Electronically Signed   By: Donavan Foil M.D.   On: 03/26/2020 15:53   DG Chest Portable 1 View  Result Date: 03/26/2020 CLINICAL DATA:  Altered mental status EXAM: PORTABLE CHEST 1 VIEW COMPARISON:  12/14/2019, 09/17/2017 FINDINGS: Cardiomegaly with mild central bronchovascular crowding. No pleural effusion or consolidation. Aortic atherosclerosis. Hazy atelectasis at the right base. IMPRESSION: Cardiomegaly. Hazy atelectasis at the right base. Electronically Signed   By: Donavan Foil M.D.   On: 03/26/2020 15:32      Assessment/Plan  Acute metabolic encephalopathy Unclear of patient's usual baseline.  CT head negative.  Chest x-ray showing only cardiomegaly and ACS looks at the apex. UA shows trace leukocytes and negative nitrite with few bacteria.  Not overtly convincing for UTI but since patient was previously hospitalized for E. coli UTI.  Will go ahead and start her on aztreonam (PCN allergy) while awaiting urine culture. TSH is at 5.320 possibly reactive.  Doubt she would have acute symptoms from this.  Acute on chronic CKD stage IIIa-give 500cc bolus  Recurrent PE/DVT/atrial fibrillation Continue Xarelto  Hypertension-stable.  Continue antihypertensives  Hypothyroidism-has elevated TSH but suspect could be reactive due to infection.  Will need to follow-up outpatient once acute symptoms resolved.  COPD with chronic hypoxemia-continue baseline 3 L  Type 2 diabetes- controlled. Last HbA1C of 6.1 in April.  GERD-continue home medication  Status is: Observation  The patient remains OBS appropriate and will d/c before 2  midnights.  Dispo: The patient is from: SNF              Anticipated d/c is to: SNF              Anticipated d/c date is: 1 day              Patient currently is not medically stable to d/c.         Orene Desanctis DO [facility]   If 7PM-7AM, please contact night-coverage [email or web address]   03/26/2020, 7:26 PM

## 2020-03-26 NOTE — ED Provider Notes (Signed)
  Physical Exam  BP 131/72 (BP Location: Right Arm)   Pulse 70   Temp 97.8 F (36.6 C) (Oral)   Resp 15   SpO2 97%   Physical Exam  ED Course/Procedures   Clinical Course as of Mar 26 1840  Tue Mar 26, 2020  1728 WBC, UA: 21-50 [JS]  1728 Bacteria, UA(!): FEW [JS]  1728 Leukocytes,Ua(!): TRACE [JS]    Clinical Course User Index [JS] Dawn Fitting, PA-C    Procedures  MDM  Patient care assumed from [place] PA at shift change, please see her note for a full HPI. Briefly, patient here for AMS from [facility], prior history of COPD, diabetes, DVTs, hypertension, PE x2.  Presents to the ED with altered mental status.  Patient does have Alzheimer's at baseline.  She was noted by nursing staff not to be getting out of bed this morning, required 2 people to be transferred over to wheelchair, she is wheelchair-bound.  No unilateral weakness was noted by prior provider.  Patient is anticoagulated currently on Xarelto, no recent falls or injuries that have been witnessed.  First EKG obtained showed patient in A. fib, does have a history of this, currently on anticoagulation.  Prior neuro exam performed by prior provider within normal limits. UA remarkable for a trace of leukocytes, few bacteria, 21-50 WBC count. Urine culture has been ordered. CBC with a slight leukocytosis of 12.7, she also has a mild AKI with an increase in her creatinine of 1.8.  TSH is also elevated on today's visit at 5.3, prior to her baseline.  Due to patient's abnormal UA and change in mental status will admit to hospitalist service.   6:41 PM Spoke to Dr. Erlinda Hong who will admit patient for further management of patient's UTI. Patient is hemodynamically stable for admission.    Portions of this note were generated with Lobbyist. Dictation errors may occur despite best attempts at proofreading.     Dawn Fitting, PA-C 03/26/20 1841    Tegeler, Gwenyth Allegra, MD 03/26/20 [phone]

## 2020-03-26 NOTE — Progress Notes (Signed)
Pharmacy Antibiotic Note  Dawn Morrow is a 84 y.o. female admitted on 03/26/2020 with AMS, hx recent UTI.  Pharmacy has been consulted for aztreonam dosing.  Pt unable to clarify allergy d/t AMS, no hx of cephalosporin or other beta lactam tolerance.    Plan: Aztreonam 2g IV every 8 hours Monitor renal function, Cx to narrow, clarify allergy when able     Temp (24hrs), Avg:97.8 F (36.6 C), Min:97.8 F (36.6 C), Max:97.8 F (36.6 C)  Recent Labs  Lab 03/26/20 1414  WBC 12.7*  CREATININE 1.80*    CrCl cannot be calculated (Unknown ideal weight.).    Allergies  Allergen Reactions  . Penicillins Anaphylaxis  . Codeine Other (See Comments)    unknown  . Sulfonamide Derivatives Other (See Comments)    unknown    Bertis Ruddy, PharmD Clinical Pharmacist ED Pharmacist Phone # [phone] 03/26/2020 7:50 PM

## 2020-03-27 ENCOUNTER — Encounter (HOSPITAL_COMMUNITY): Payer: Self-pay | Admitting: Internal Medicine

## 2020-03-27 DIAGNOSIS — N390 Urinary tract infection, site not specified: Secondary | ICD-10-CM | POA: Diagnosis present

## 2020-03-27 DIAGNOSIS — Z86711 Personal history of pulmonary embolism: Secondary | ICD-10-CM

## 2020-03-27 DIAGNOSIS — G9341 Metabolic encephalopathy: Secondary | ICD-10-CM | POA: Diagnosis present

## 2020-03-27 DIAGNOSIS — R4182 Altered mental status, unspecified: Secondary | ICD-10-CM

## 2020-03-27 DIAGNOSIS — K219 Gastro-esophageal reflux disease without esophagitis: Secondary | ICD-10-CM

## 2020-03-27 DIAGNOSIS — E1122 Type 2 diabetes mellitus with diabetic chronic kidney disease: Secondary | ICD-10-CM | POA: Diagnosis present

## 2020-03-27 DIAGNOSIS — I482 Chronic atrial fibrillation, unspecified: Secondary | ICD-10-CM | POA: Diagnosis not present

## 2020-03-27 DIAGNOSIS — Z6835 Body mass index (BMI) 35.0-35.9, adult: Secondary | ICD-10-CM | POA: Diagnosis not present

## 2020-03-27 DIAGNOSIS — M8580 Other specified disorders of bone density and structure, unspecified site: Secondary | ICD-10-CM | POA: Diagnosis present

## 2020-03-27 DIAGNOSIS — F329 Major depressive disorder, single episode, unspecified: Secondary | ICD-10-CM | POA: Diagnosis present

## 2020-03-27 DIAGNOSIS — Z86718 Personal history of other venous thrombosis and embolism: Secondary | ICD-10-CM | POA: Diagnosis not present

## 2020-03-27 DIAGNOSIS — E1159 Type 2 diabetes mellitus with other circulatory complications: Secondary | ICD-10-CM

## 2020-03-27 DIAGNOSIS — Z20822 Contact with and (suspected) exposure to covid-19: Secondary | ICD-10-CM | POA: Diagnosis present

## 2020-03-27 DIAGNOSIS — Z993 Dependence on wheelchair: Secondary | ICD-10-CM | POA: Diagnosis not present

## 2020-03-27 DIAGNOSIS — J449 Chronic obstructive pulmonary disease, unspecified: Secondary | ICD-10-CM

## 2020-03-27 DIAGNOSIS — Z7901 Long term (current) use of anticoagulants: Secondary | ICD-10-CM | POA: Diagnosis not present

## 2020-03-27 DIAGNOSIS — E039 Hypothyroidism, unspecified: Secondary | ICD-10-CM

## 2020-03-27 DIAGNOSIS — N1831 Chronic kidney disease, stage 3a: Secondary | ICD-10-CM | POA: Diagnosis present

## 2020-03-27 DIAGNOSIS — I10 Essential (primary) hypertension: Secondary | ICD-10-CM

## 2020-03-27 DIAGNOSIS — I1310 Hypertensive heart and chronic kidney disease without heart failure, with stage 1 through stage 4 chronic kidney disease, or unspecified chronic kidney disease: Secondary | ICD-10-CM | POA: Diagnosis present

## 2020-03-27 DIAGNOSIS — K449 Diaphragmatic hernia without obstruction or gangrene: Secondary | ICD-10-CM | POA: Diagnosis present

## 2020-03-27 DIAGNOSIS — F419 Anxiety disorder, unspecified: Secondary | ICD-10-CM | POA: Diagnosis present

## 2020-03-27 DIAGNOSIS — M1990 Unspecified osteoarthritis, unspecified site: Secondary | ICD-10-CM | POA: Diagnosis present

## 2020-03-27 DIAGNOSIS — Z79899 Other long term (current) drug therapy: Secondary | ICD-10-CM | POA: Diagnosis not present

## 2020-03-27 DIAGNOSIS — R627 Adult failure to thrive: Secondary | ICD-10-CM | POA: Diagnosis present

## 2020-03-27 DIAGNOSIS — J9611 Chronic respiratory failure with hypoxia: Secondary | ICD-10-CM | POA: Diagnosis present

## 2020-03-27 DIAGNOSIS — E785 Hyperlipidemia, unspecified: Secondary | ICD-10-CM | POA: Diagnosis present

## 2020-03-27 DIAGNOSIS — Z79890 Hormone replacement therapy (postmenopausal): Secondary | ICD-10-CM | POA: Diagnosis not present

## 2020-03-27 DIAGNOSIS — B952 Enterococcus as the cause of diseases classified elsewhere: Secondary | ICD-10-CM | POA: Diagnosis present

## 2020-03-27 LAB — BASIC METABOLIC PANEL
Anion gap: 9 (ref 5–15)
BUN: 21 mg/dL (ref 8–23)
CO2: 30 mmol/L (ref 22–32)
Calcium: 8.7 mg/dL — ABNORMAL LOW (ref 8.9–10.3)
Chloride: 101 mmol/L (ref 98–111)
Creatinine, Ser: 1.51 mg/dL — ABNORMAL HIGH (ref 0.44–1.00)
GFR calc Af Amer: 37 mL/min — ABNORMAL LOW (ref >60)
GFR calc non Af Amer: 32 mL/min — ABNORMAL LOW (ref >60)
Glucose, Bld: 108 mg/dL — ABNORMAL HIGH (ref 70–99)
Potassium: 4 mmol/L (ref 3.5–5.1)
Sodium: 140 mmol/L (ref 135–145)

## 2020-03-27 LAB — CBC
HCT: 43.1 % (ref 36.0–46.0)
Hemoglobin: 13.4 g/dL (ref 12.0–15.0)
MCH: 29.8 pg (ref 26.0–34.0)
MCHC: 31.1 g/dL (ref 30.0–36.0)
MCV: 95.8 fL (ref 80.0–100.0)
Platelets: 163 10*3/uL (ref 150–400)
RBC: 4.5 MIL/uL (ref 3.87–5.11)
RDW: 17.5 % — ABNORMAL HIGH (ref 11.5–15.5)
WBC: 13.6 10*3/uL — ABNORMAL HIGH (ref 4.0–10.5)
nRBC: 0 % (ref 0.0–0.2)

## 2020-03-27 LAB — MRSA PCR SCREENING: MRSA by PCR: POSITIVE — AB

## 2020-03-27 MED ORDER — MUPIROCIN 2 % EX OINT
1.0000 "application " | TOPICAL_OINTMENT | Freq: Two times a day (BID) | CUTANEOUS | Status: AC
  Administered 2020-03-27 – 2020-03-31 (×10): 1 via NASAL
  Filled 2020-03-27: qty 22

## 2020-03-27 MED ORDER — RIVAROXABAN 20 MG PO TABS
20.0000 mg | ORAL_TABLET | Freq: Every day | ORAL | Status: DC
  Administered 2020-03-27 – 2020-03-29 (×3): 20 mg via ORAL
  Filled 2020-03-27 (×3): qty 1

## 2020-03-27 MED ORDER — CHLORHEXIDINE GLUCONATE CLOTH 2 % EX PADS
6.0000 | MEDICATED_PAD | Freq: Every day | CUTANEOUS | Status: AC
  Administered 2020-03-27 – 2020-03-31 (×4): 6 via TOPICAL

## 2020-03-27 NOTE — Progress Notes (Signed)
Attempted to call patient's niece [name] back after a miss call. No answer and was not able to leave a voicemail at this time. Will attempt to call again later in the shift.

## 2020-03-27 NOTE — Plan of Care (Signed)
  Problem: Activity: Goal: Risk for activity intolerance will decrease Outcome: Progressing   Problem: Safety: Goal: Ability to remain free from injury will improve Outcome: Progressing   Problem: Skin Integrity: Goal: Risk for impaired skin integrity will decrease Outcome: Progressing   

## 2020-03-27 NOTE — Evaluation (Signed)
Physical Therapy Evaluation Patient Details Name: Dawn Morrow MRN: [id number] DOB: [birth year] Today's Date: 03/27/2020   History of Present Illness  Pt is an 84 y/o female admitted secondary to acute metabolic encephalopathy; possibly from UTI. CT head is negative for acute abnormality. PMH includes DM, HTN, COPD, PE, DVT, and a fib.   Clinical Impression  Pt admitted secondary to problem above with deficits below. Pt requiring mod A to stand. Pt unable to take side steps at EOB secondary to knee buckling. Unsure of pt's baseline as pt with cognitive deficits. Pt from [facility] per notes. If ALF able to provide necessary assist for transfers, would likely be able to d/c back to ALF with PT follow up. However, if ALF unable to provide necessary assist, will likely required SNF level therapies. Will continue to follow acutely to maximize functional mobility independence and safety.     Follow Up Recommendations SNF;Supervision/Assistance - 24 hour (unless ALF able to provide necessary assist. )    Equipment Recommendations  None recommended by PT    Recommendations for Other Services       Precautions / Restrictions Precautions Precautions: Fall Restrictions Weight Bearing Restrictions: No      Mobility  Bed Mobility Overal bed mobility: Needs Assistance Bed Mobility: Supine to Sit;Sit to Supine     Supine to sit: Min assist Sit to supine: Supervision   General bed mobility comments: Min A for trunk elevation to come to sitting. Increased time required. Supervision for safety to return to supine.   Transfers Overall transfer level: Needs assistance Equipment used: Rolling walker (2 wheeled) Transfers: Sit to/from Stand Sit to Stand: Mod assist         General transfer comment: Mod A for lift assist and steadying to stand. PT braced RW as pt pulling up on RW. Noted increased tremors in standing.   Ambulation/Gait             General Gait Details: Attempted to  take side steps, however, pt with knee buckling and required assist for controlled descent back to sitting at EOB.   Stairs            Wheelchair Mobility    Modified Rankin (Stroke Patients Only)       Balance Overall balance assessment: Needs assistance Sitting-balance support: No upper extremity supported;Feet supported Sitting balance-Leahy Scale: Fair     Standing balance support: Bilateral upper extremity supported;During functional activity Standing balance-Leahy Scale: Poor Standing balance comment: Reliant on BUE and external support.                              Pertinent Vitals/Pain Pain Assessment: No/denies pain    Home Living Family/patient expects to be discharged to:: Assisted living               Home Equipment: Wheelchair - manual Additional Comments: [place]    Prior Function Level of Independence: Needs assistance   Gait / Transfers Assistance Needed: Reports she used WC, but was unsure if she required assist.   ADL's / Homemaking Assistance Needed: Unsure if she required assist with ADLs.         Hand Dominance        Extremity/Trunk Assessment   Upper Extremity Assessment Upper Extremity Assessment: Defer to OT evaluation    Lower Extremity Assessment Lower Extremity Assessment: Generalized weakness (BLE tremors upon standing. )    Cervical / Trunk Assessment Cervical /  Trunk Assessment: Kyphotic  Communication   Communication: HOH  Cognition Arousal/Alertness: Awake/alert Behavior During Therapy: WFL for tasks assessed/performed Overall Cognitive Status: No family/caregiver present to determine baseline cognitive functioning                                 General Comments: Pt with slowed processing when responding to cues. Pt alert to person, place and situation. Had to look at whiteboard for date.       General Comments General comments (skin integrity, edema, etc.): No family present     Exercises     Assessment/Plan    PT Assessment Patient needs continued PT services  PT Problem List Decreased strength;Decreased balance;Decreased mobility;Decreased knowledge of use of DME;Decreased knowledge of precautions       PT Treatment Interventions DME instruction;Functional mobility training;Therapeutic activities;Therapeutic exercise;Balance training;Patient/family education;Wheelchair mobility training;Cognitive remediation    PT Goals (Current goals can be found in the Care Plan section)  Acute Rehab PT Goals Patient Stated Goal: to eat lunch PT Goal Formulation: With patient Time For Goal Achievement: 04/10/20 Potential to Achieve Goals: Good    Frequency Min 2X/week   Barriers to discharge        Co-evaluation               AM-PAC PT "6 Clicks" Mobility  Outcome Measure Help needed turning from your back to your side while in a flat bed without using bedrails?: A Little Help needed moving from lying on your back to sitting on the side of a flat bed without using bedrails?: A Little Help needed moving to and from a bed to a chair (including a wheelchair)?: Total Help needed standing up from a chair using your arms (e.g., wheelchair or bedside chair)?: A Lot Help needed to walk in hospital room?: Total Help needed climbing 3-5 steps with a railing? : Total 6 Click Score: 11    End of Session Equipment Utilized During Treatment: Gait belt Activity Tolerance: Patient tolerated treatment well Patient left: in bed;with call bell/phone within reach;with bed alarm set Nurse Communication: Mobility status PT Visit Diagnosis: Unsteadiness on feet (R26.81);Muscle weakness (generalized) (M62.81)    Time: 7846-9629 PT Time Calculation (min) (ACUTE ONLY): 17 min   Charges:   PT Evaluation $PT Eval Moderate Complexity: 1 Mod          Reuel Derby, PT, DPT  Acute Rehabilitation Services  Pager: [phone] Office: [phone]   Rudean Hitt 03/27/2020, 12:35 PM

## 2020-03-27 NOTE — Progress Notes (Signed)
PROGRESS NOTE    Dawn Morrow  UXN:[id number] DOB: 04/17/1936 DOA: 03/26/2020 PCP: Colon Branch, MD   Brief Narrative:  HPI on 03/26/2020 by Dr. Ileene Musa Dawn Morrow is a 84 y.o. female with medical history significant for COPD with chronic hypoxemia on 3 L, atrial fibrillation, PE/DVT on Xarelto, hypertension, CKD stage 3a, type 2 diabetes, hypothyroidism, MGUS who presents with concerns of altered mental status.  Patient unable to provide any history as to why she is here in the ED.  Per ED documentation, nursing staff at her SNF thought that she was different from her baseline.  Needing more assistance with transfer.  She was last admitted to the hospital in April with altered mental status due to E. coli UTI. Assessment & Plan   Acute metabolic encephalopathy -Unclear what patient's baseline is normally -CT head unremarkable -Chest x-rayshowing cardiomegaly, but no infection -UA showing trace leukocytes, negative nitrites, few bacteria -Of note, patient was hospitalized recently for E. coli UTI -She does have a penicillin allergy, therefore continue aztreonam -Urine culture pending -TSH 5.32, possibly reactive  Acute the injury on chronic kidney disease, stage IIIa -Creatinine 1.8 on admission, baseline is approximately 1.1 -Given IV fluids -Creatinine down to 1.51  Recurrent PE/DVT/atrial fibrillation -Continue Xarelto  Essential hypertension -BP currently stable  Hypothyroidism -TSH is elevated however this may be reactive -Would not change Synthroid dose at this time given possible infection -Should have repeat thyroid function tests as an outpatient  COPD with chronic hypoxemia -Continue baseline of 3 L of nasal cannula  Diabetes mellitus, type II -Hemoglobin A1c was 6.1 in April 2021 -Appears to be controlled -Monitor CBGs, continue insulin sliding scale  GERD -continue PPI  DVT Prophylaxis  Xarelto  Code Status: Full  Family Communication: None at  bedside  Disposition Plan:  Status is: Observation  The patient will require care spanning > 2 midnights and should be moved to inpatient because: Altered mental status  Dispo: The patient is from: SNF              Anticipated d/c is to: SNF              Anticipated d/c date is: 2 days              Patient currently is not medically stable to d/c.   Consultants None  Procedures  None  Antibiotics   Anti-infectives (From admission, onward)   Start     Dose/Rate Route Frequency Ordered Stop   03/26/20 2200  aztreonam (AZACTAM) 2 g in sodium chloride 0.9 % 100 mL IVPB     Discontinue     2 g 200 mL/hr over 30 Minutes Intravenous Every 8 hours 03/26/20 1953        Subjective:   Dawn Morrow seen and examined today.  Patient cannot tell me where she is today.  Only knows her name.  Denies chest pain or shortness of breath. Denies abdominal pain, nausea, vomiting. Does state that she feels tired.  Objective:   Vitals:   03/26/20 2247 03/27/20 0040 03/27/20 0832 03/27/20 1131  BP: (!) 143/104 (!) 154/78 (!) 153/87 (!) 145/91  Pulse: 95 74 88 78  Resp: (!) 24 18 20 16   Temp:  97.9 F (36.6 C) 97.8 F (36.6 C) 98.6 F (37 C)  TempSrc:  Oral Oral Oral  SpO2: 97% 98% 95% 97%    Intake/Output Summary (Last 24 hours) at 03/27/2020 1601 Last data filed at 03/27/2020 1515  Gross per 24 hour  Intake 1032.67 ml  Output 500 ml  Net 532.67 ml   There were no vitals filed for this visit.  Exam  General: Well developed, well nourished, NAD, appears stated age  96: NCAT,  mucous membranes moist.   Cardiovascular: S1 S2 auscultated, irregular  Respiratory: Clear to auscultation bilaterally  Abdomen: Soft, nontender, nondistended, + bowel sounds  Extremities: warm dry without cyanosis clubbing or edema  Neuro: AAOx1 (self only), nonfocal  Psych: pleasantly confused   Data Reviewed: I have personally reviewed following labs and imaging studies  CBC: Recent Labs  Lab  03/26/20 1414 03/27/20 0427  WBC 12.7* 13.6*  NEUTROABS 10.0*  --   HGB 12.7 13.4  HCT 42.2 43.1  MCV 96.3 95.8  PLT 162 035   Basic Metabolic Panel: Recent Labs  Lab 03/26/20 1414 03/27/20 0427  NA 140 140  K 4.0 4.0  CL 102 101  CO2 29 30  GLUCOSE 113* 108*  BUN 26* 21  CREATININE 1.80* 1.51*  CALCIUM 8.8* 8.7*   GFR: CrCl cannot be calculated (Unknown ideal weight.). Liver Function Tests: Recent Labs  Lab 03/26/20 1414  AST 19  ALT 15  ALKPHOS 75  BILITOT 1.1  PROT 7.2  ALBUMIN 3.2*   No results for input(s): LIPASE, AMYLASE in the last 168 hours. No results for input(s): AMMONIA in the last 168 hours. Coagulation Profile: Recent Labs  Lab 03/26/20 1518  INR 1.3*   Cardiac Enzymes: No results for input(s): CKTOTAL, CKMB, CKMBINDEX, TROPONINI in the last 168 hours. BNP (last 3 results) No results for input(s): PROBNP in the last 8760 hours. HbA1C: No results for input(s): HGBA1C in the last 72 hours. CBG: Recent Labs  Lab 03/26/20 1424  GLUCAP 109*   Lipid Profile: No results for input(s): CHOL, HDL, LDLCALC, TRIG, CHOLHDL, LDLDIRECT in the last 72 hours. Thyroid Function Tests: Recent Labs    03/26/20 1415  TSH 5.320*   Anemia Panel: No results for input(s): VITAMINB12, FOLATE, FERRITIN, TIBC, IRON, RETICCTPCT in the last 72 hours. Urine analysis:    Component Value Date/Time   COLORURINE YELLOW 03/26/2020 1705   APPEARANCEUR HAZY (A) 03/26/2020 1705   LABSPEC 1.019 03/26/2020 1705   PHURINE 5.0 03/26/2020 1705   GLUCOSEU NEGATIVE 03/26/2020 1705   GLUCOSEU NEGATIVE 06/12/2014 1410   HGBUR SMALL (A) 03/26/2020 1705   BILIRUBINUR NEGATIVE 03/26/2020 1705   BILIRUBINUR Neg 06/19/2016 1101   KETONESUR NEGATIVE 03/26/2020 1705   PROTEINUR 30 (A) 03/26/2020 1705   UROBILINOGEN >=8.0 06/19/2016 1101   UROBILINOGEN 0.2 05/31/2015 1835   NITRITE NEGATIVE 03/26/2020 1705   LEUKOCYTESUR TRACE (A) 03/26/2020 1705   Sepsis Labs:  @LABRCNTIP (procalcitonin:4,lacticidven:4)  ) Recent Results (from the past 240 hour(s))  Urine culture     Status: Abnormal (Preliminary result)   Collection Time: 03/26/20  5:05 PM   Specimen: Urine, Catheterized  Result Value Ref Range Status   Specimen Description URINE, CATHETERIZED  Final   Special Requests   Final    NONE Performed at [facility], [address] [place], [place] [address]    Culture >=100,000 COLONIES/mL ENTEROCOCCUS FAECALIS (A)  Final   Report Status PENDING  Incomplete  SARS Coronavirus 2 by RT PCR (hospital order, performed in [facility] hospital lab) Nasopharyngeal Nasopharyngeal Swab     Status: None   Collection Time: 03/26/20  7:54 PM   Specimen: Nasopharyngeal Swab  Result Value Ref Range Status   SARS Coronavirus 2 NEGATIVE  NEGATIVE Final    Comment: (NOTE) SARS-CoV-2 target nucleic acids are NOT DETECTED.  The SARS-CoV-2 RNA is generally detectable in upper and lower respiratory specimens during the acute phase of infection. The lowest concentration of SARS-CoV-2 viral copies this assay can detect is 250 copies / mL. A negative result does not preclude SARS-CoV-2 infection and should not be used as the sole basis for treatment or other patient management decisions.  A negative result may occur with improper specimen collection / handling, submission of specimen other than nasopharyngeal swab, presence of viral mutation(s) within the areas targeted by this assay, and inadequate number of viral copies (<250 copies / mL). A negative result must be combined with clinical observations, patient history, and epidemiological information.  Fact Sheet for Patients:   StrictlyIdeas.no  Fact Sheet for Healthcare Providers: BankingDealers.co.za  This test is not yet approved or  cleared by the [place] FDA and has been authorized for detection and/or diagnosis of SARS-CoV-2 by FDA under an  Emergency Use Authorization (EUA).  This EUA will remain in effect (meaning this test can be used) for the duration of the COVID-19 declaration under Section 564(b)(1) of the Act, 21 U.S.C. section 360bbb-3(b)(1), unless the authorization is terminated or revoked sooner.  Performed at [facility], [place] [address] [place], [place] [address]   MRSA PCR Screening     Status: Abnormal   Collection Time: 03/27/20 12:37 AM   Specimen: Nasal Mucosa; Nasopharyngeal  Result Value Ref Range Status   MRSA by PCR POSITIVE (A) NEGATIVE Final    Comment: CRITICAL RESULT CALLED TO, READ BACK BY AND VERIFIED WITH: RN Teodora Medici [id number] @0216  THANEY Performed at [facility] [address], [place], [place] [address]       Radiology Studies: CT Head Wo Contrast  Result Date: 03/26/2020 CLINICAL DATA:  Altered mental status EXAM: CT HEAD WITHOUT CONTRAST TECHNIQUE: Contiguous axial images were obtained from the base of the skull through the vertex without intravenous contrast. COMPARISON:  CT brain 12/14/2019, MRI brain 12/16/2019 FINDINGS: Brain: No acute territorial infarction, hemorrhage or intracranial mass. Marked hypodensity in the white matter consistent with chronic small vessel ischemic change. Chronic appearing lacunar infarcts in the basal ganglia. Stable ventricle size. Vascular: No hyperdense vessels.  Carotid vascular calcification. Skull: Normal. Negative for fracture or focal lesion. Sinuses/Orbits: No acute finding. Other: None IMPRESSION: 1. No CT evidence for acute intracranial abnormality. 2. Chronic small vessel ischemic changes of the white matter. Electronically Signed   By: Donavan Foil M.D.   On: 03/26/2020 15:53   DG Chest Portable 1 View  Result Date: 03/26/2020 CLINICAL DATA:  Altered mental status EXAM: PORTABLE CHEST 1 VIEW COMPARISON:  12/14/2019, 09/17/2017 FINDINGS: Cardiomegaly with mild central bronchovascular crowding. No pleural effusion or  consolidation. Aortic atherosclerosis. Hazy atelectasis at the right base. IMPRESSION: Cardiomegaly. Hazy atelectasis at the right base. Electronically Signed   By: Donavan Foil M.D.   On: 03/26/2020 15:32     Scheduled Meds: . Chlorhexidine Gluconate Cloth  6 each Topical Q0600  . mupirocin ointment  1 application Nasal BID  . rivaroxaban  20 mg Oral Q supper   Continuous Infusions: . aztreonam 2 g (03/27/20 1438)     LOS: 0 days   Time Spent in minutes   45 minutes  Willi Borowiak D.O. on 03/27/2020 at 4:01 PM  Between 7am to 7pm - Please see pager noted on [email or web address]  After 7pm go to [email or web address]  And  look for the night coverage person covering for me after hours  [facility] Office  [phone]

## 2020-03-28 DIAGNOSIS — J449 Chronic obstructive pulmonary disease, unspecified: Secondary | ICD-10-CM | POA: Diagnosis not present

## 2020-03-28 DIAGNOSIS — I482 Chronic atrial fibrillation, unspecified: Secondary | ICD-10-CM | POA: Diagnosis not present

## 2020-03-28 DIAGNOSIS — E1159 Type 2 diabetes mellitus with other circulatory complications: Secondary | ICD-10-CM | POA: Diagnosis not present

## 2020-03-28 DIAGNOSIS — R4182 Altered mental status, unspecified: Secondary | ICD-10-CM | POA: Diagnosis not present

## 2020-03-28 LAB — BASIC METABOLIC PANEL
Anion gap: 9 (ref 5–15)
BUN: 18 mg/dL (ref 8–23)
CO2: 29 mmol/L (ref 22–32)
Calcium: 8.8 mg/dL — ABNORMAL LOW (ref 8.9–10.3)
Chloride: 102 mmol/L (ref 98–111)
Creatinine, Ser: 1.35 mg/dL — ABNORMAL HIGH (ref 0.44–1.00)
GFR calc Af Amer: 42 mL/min — ABNORMAL LOW (ref >60)
GFR calc non Af Amer: 36 mL/min — ABNORMAL LOW (ref >60)
Glucose, Bld: 129 mg/dL — ABNORMAL HIGH (ref 70–99)
Potassium: 4 mmol/L (ref 3.5–5.1)
Sodium: 140 mmol/L (ref 135–145)

## 2020-03-28 LAB — CBC
HCT: 42.8 % (ref 36.0–46.0)
Hemoglobin: 13.4 g/dL (ref 12.0–15.0)
MCH: 29.9 pg (ref 26.0–34.0)
MCHC: 31.3 g/dL (ref 30.0–36.0)
MCV: 95.5 fL (ref 80.0–100.0)
Platelets: 170 10*3/uL (ref 150–400)
RBC: 4.48 MIL/uL (ref 3.87–5.11)
RDW: 17.4 % — ABNORMAL HIGH (ref 11.5–15.5)
WBC: 10.9 10*3/uL — ABNORMAL HIGH (ref 4.0–10.5)
nRBC: 0 % (ref 0.0–0.2)

## 2020-03-28 LAB — URINE CULTURE: Culture: >=100000 — AB

## 2020-03-28 MED ORDER — EZETIMIBE 10 MG PO TABS
10.0000 mg | ORAL_TABLET | Freq: Every day | ORAL | Status: DC
  Administered 2020-03-28 – 2020-04-04 (×8): 10 mg via ORAL
  Filled 2020-03-28 (×8): qty 1

## 2020-03-28 MED ORDER — GABAPENTIN 300 MG PO CAPS: 300.0000 mg | ORAL_CAPSULE | ORAL | Status: DC

## 2020-03-28 MED ORDER — MECLIZINE HCL 12.5 MG PO TABS: 25.0000 mg | ORAL_TABLET | Freq: Every evening | ORAL | Status: DC | PRN

## 2020-03-28 MED ORDER — SERTRALINE HCL 50 MG PO TABS
75.0000 mg | ORAL_TABLET | Freq: Every day | ORAL | Status: DC
  Administered 2020-03-28 – 2020-04-04 (×8): 75 mg via ORAL
  Filled 2020-03-28 (×10): qty 2

## 2020-03-28 MED ORDER — CARVEDILOL 6.25 MG PO TABS
6.2500 mg | ORAL_TABLET | Freq: Two times a day (BID) | ORAL | Status: DC
  Administered 2020-03-28 – 2020-04-04 (×14): 6.25 mg via ORAL
  Filled 2020-03-28 (×14): qty 1

## 2020-03-28 MED ORDER — BUPROPION HCL ER (XL) 300 MG PO TB24
450.0000 mg | ORAL_TABLET | Freq: Every day | ORAL | Status: DC
  Filled 2020-03-28: qty 1

## 2020-03-28 MED ORDER — DOCUSATE SODIUM 100 MG PO CAPS
100.0000 mg | ORAL_CAPSULE | Freq: Every day | ORAL | Status: DC
  Administered 2020-03-28 – 2020-04-04 (×8): 100 mg via ORAL
  Filled 2020-03-28 (×8): qty 1

## 2020-03-28 MED ORDER — GABAPENTIN 300 MG PO CAPS
600.0000 mg | ORAL_CAPSULE | Freq: Every day | ORAL | Status: DC
  Administered 2020-03-28 – 2020-04-03 (×7): 600 mg via ORAL
  Filled 2020-03-28 (×7): qty 2

## 2020-03-28 MED ORDER — POLYETHYLENE GLYCOL 3350 17 G PO PACK
17.0000 g | PACK | Freq: Every day | ORAL | Status: DC | PRN
  Administered 2020-04-02: 17 g via ORAL
  Filled 2020-03-28: qty 1

## 2020-03-28 MED ORDER — BUPROPION HCL ER (XL) 150 MG PO TB24
450.0000 mg | ORAL_TABLET | Freq: Every day | ORAL | Status: DC
  Administered 2020-03-28 – 2020-04-04 (×8): 450 mg via ORAL
  Filled 2020-03-28 (×8): qty 3

## 2020-03-28 MED ORDER — PROPYLENE GLYCOL 0.6 % OP SOLN: 1.0000 [drp] | Freq: Four times a day (QID) | OPHTHALMIC | Status: DC | PRN

## 2020-03-28 MED ORDER — LORAZEPAM 0.5 MG PO TABS: 0.5000 mg | ORAL_TABLET | Freq: Three times a day (TID) | ORAL | Status: DC | PRN

## 2020-03-28 MED ORDER — POLYVINYL ALCOHOL 1.4 % OP SOLN
1.0000 [drp] | OPHTHALMIC | Status: DC | PRN
  Administered 2020-04-04: 1 [drp] via OPHTHALMIC
  Filled 2020-03-28: qty 15

## 2020-03-28 MED ORDER — ATORVASTATIN CALCIUM 10 MG PO TABS
10.0000 mg | ORAL_TABLET | Freq: Every day | ORAL | Status: DC
  Administered 2020-03-28 – 2020-04-03 (×7): 10 mg via ORAL
  Filled 2020-03-28 (×7): qty 1

## 2020-03-28 MED ORDER — LEVOTHYROXINE SODIUM 88 MCG PO TABS
88.0000 ug | ORAL_TABLET | Freq: Every day | ORAL | Status: DC
  Administered 2020-03-29 – 2020-04-04 (×7): 88 ug via ORAL
  Filled 2020-03-28 (×7): qty 1

## 2020-03-28 MED ORDER — ALBUTEROL SULFATE (2.5 MG/3ML) 0.083% IN NEBU: 2.5000 mg | INHALATION_SOLUTION | Freq: Four times a day (QID) | RESPIRATORY_TRACT | Status: DC | PRN

## 2020-03-28 MED ORDER — VANCOMYCIN HCL IN DEXTROSE 1-5 GM/200ML-% IV SOLN
1000.0000 mg | INTRAVENOUS | Status: AC
  Administered 2020-03-28 – 2020-03-30 (×3): 1000 mg via INTRAVENOUS
  Filled 2020-03-28 (×3): qty 200

## 2020-03-28 MED ORDER — GABAPENTIN 300 MG PO CAPS
300.0000 mg | ORAL_CAPSULE | Freq: Two times a day (BID) | ORAL | Status: DC
  Administered 2020-03-28 – 2020-04-04 (×15): 300 mg via ORAL
  Filled 2020-03-28 (×15): qty 1

## 2020-03-28 NOTE — Progress Notes (Signed)
Pharmacy Antibiotic Note  Dawn Morrow is a 84 y.o. female admitted on 03/26/2020 with UTI. Pt was previously on aztreonam for UTI coverage. However, given patient's documented penicillin allergy and results of urine culture, pharmacy has been consulted for vancomycin dosing.  Plan: Vancomycin 1000mg  IV every 24 hours.  Goal trough 10-15 mcg/mL. Culture results were discussed with Dr. Ree Kida and the patient's antibiotics will be changed to vancomycin from aztreonam.    Temp (24hrs), Avg:98.3 F (36.8 C), Min:97.9 F (36.6 C), Max:98.7 F (37.1 C)  Recent Labs  Lab 03/26/20 1414 03/27/20 0427 03/28/20 0526  WBC 12.7* 13.6* 10.9*  CREATININE 1.80* 1.51* 1.35*    CrCl cannot be calculated (Unknown ideal weight.).    Allergies  Allergen Reactions  . Penicillins Anaphylaxis  . Codeine Other (See Comments)    unknown  . Sulfonamide Derivatives Other (See Comments)    unknown    Antimicrobials this admission: Aztreonam 7/20>>7/22 Vancomycin 7/22>>    Microbiology results: 7/20 UCx: >100,000 enterococcus faecalis  7/21 MRSA PCR: positive  Thank you for allowing pharmacy to be a part of this patient's care.  Carolin Guernsey  PGY1 Pharmacy Resident  03/28/2020 9:53 AM

## 2020-03-28 NOTE — Evaluation (Signed)
Occupational Therapy Evaluation Patient Details Name: Dawn Morrow MRN: [id number] DOB: 1936-05-13 Today's Date: 03/28/2020    History of Present Illness Pt is an 84 y/o female admitted secondary to acute metabolic encephalopathy; possibly from UTI. CT head is negative for acute abnormality. PMH includes DM, HTN, COPD, PE, DVT, and a fib.    Clinical Impression   PTA pt reports being needing assist with some ADLs and utilizing a wc for mobility. Baseline unclear due to pt being limited historian being admitted for AMS and no family/caregiver present to confirm information. Pt was admitted for above and treated for problem list below (see OT Problem List). Pt A&Ox3 disoriented to day but could identify month and year. Pt presenting with decreased safety awareness with wanting to get up and walk even though she reported she spent most of her time sitting and had not gone for a walk in some time. Requires set up to Total A for ADLs due to decreased balance, strength, activity tolerance, and tremors noted in B UEs. Requires Mod A with sit to stand with verbal cues for hand placement with RW. Pt unable to step forward but could march in place with noted decreased standing tolerance on R leg and increased fatigue needing to sit after <1 minute of standing. Believe pt would benefit from skilled OT services acutely and at the SNF [facility] - if facility can provide rehab services) level to increase safety with ADLs and return to PLOF.    Follow Up Recommendations  SNF;Supervision/Assistance - 24 hour ([facility] if could provide rehab services)    Equipment Recommendations  Other (comment) (TBD at next venue of care)       Precautions / Restrictions Precautions Precautions: Fall Restrictions Weight Bearing Restrictions: No      Mobility Bed Mobility Overal bed mobility: Needs Assistance Bed Mobility: Supine to Sit;Sit to Supine     Supine to sit: Min assist Sit to supine: Supervision    General bed mobility comments: Min A for trunk elevation to come to sitting. Increased time required. Supervision for safety to return to supine.   Transfers Overall transfer level: Needs assistance Equipment used: Rolling walker (2 wheeled) Transfers: Sit to/from Stand Sit to Stand: Mod assist         General transfer comment: Mod A for lift and steady. Pt trying to use walker to stand and needed verbal cues for hand placement    Balance Overall balance assessment: Needs assistance Sitting-balance support: No upper extremity supported;Feet supported Sitting balance-Leahy Scale: Fair Sitting balance - Comments: close supervision   Standing balance support: Bilateral upper extremity supported;During functional activity Standing balance-Leahy Scale: Poor Standing balance comment: Reliant on BUE and external support.                            ADL either performed or assessed with clinical judgement   ADL Overall ADL's : Needs assistance/impaired Eating/Feeding: Minimal assistance;Bed level Eating/Feeding Details (indicate cue type and reason): Min A for opening containers Grooming: Set up;Sitting;Wash/dry face Grooming Details (indicate cue type and reason): sitting with set up to wash face and apply lotion Upper Body Bathing: Minimal assistance;Sitting Upper Body Bathing Details (indicate cue type and reason): Min A due to sitting balance Lower Body Bathing: Sitting/lateral leans;Sit to/from stand;Maximal assistance Lower Body Bathing Details (indicate cue type and reason): Max A for sitting balance and Mod transfer Upper Body Dressing : Minimal assistance;Sitting Upper Body Dressing Details (indicate cue  type and reason): Min A for sitting balance Lower Body Dressing: Total assistance;Sit to/from stand;Sitting/lateral leans;Cueing for sequencing;Cueing for safety Lower Body Dressing Details (indicate cue type and reason): Total A for need for BUE support Toileting-  Clothing Manipulation and Hygiene: Total assistance;Sit to/from stand;Sitting/lateral lean;Cueing for safety;Cueing for sequencing Toileting - Clothing Manipulation Details (indicate cue type and reason): Total A for reliance on BUE support in standing    Tub/Shower Transfer Details (indicate cue type and reason): deferred due to pt safety Functional mobility during ADLs: Moderate assistance;Rolling walker;Cueing for safety;Cueing for sequencing General ADL Comments: Set up - Total A for ADLs due to decreased cognition, weakness, and decreased balance     Vision Baseline Vision/History: Wears glasses Patient Visual Report: No change from baseline              Pertinent Vitals/Pain Pain Assessment: No/denies pain     Hand Dominance Right   Extremity/Trunk Assessment Upper Extremity Assessment Upper Extremity Assessment: Generalized weakness (noted BUE tremors)   Lower Extremity Assessment Lower Extremity Assessment: Defer to PT evaluation   Cervical / Trunk Assessment Cervical / Trunk Assessment: Kyphotic   Communication Communication Communication: HOH   Cognition Arousal/Alertness: Awake/alert Behavior During Therapy: WFL for tasks assessed/performed Overall Cognitive Status: Impaired/Different from baseline Area of Impairment: Orientation;Attention;Following commands;Safety/judgement;Awareness;Problem solving                 Orientation Level: Disoriented to;Time (knew month and year just not day) Current Attention Level: Sustained   Following Commands: Follows one step commands with increased time Safety/Judgement: Decreased awareness of safety;Decreased awareness of deficits Awareness: Intellectual Problem Solving: Slow processing;Decreased initiation General Comments: Pt with slowed processing and decreased safety awareness wanting to walk even though her foot felt "asleep". Pt very pleasant but stating decreased awareness reporting she woke up to a wet bed.     General Comments  No family present to determine baseline            Home Living Family/patient expects to be discharged to:: Assisted living                             Home Equipment: Wheelchair - manual   Additional Comments: [facility]      Prior Functioning/Environment Level of Independence: Needs assistance  Gait / Transfers Assistance Needed: Reports she used WC, but was unsure if she required assist.  ADL's / Homemaking Assistance Needed: Pt reports needing assist with bathing and dressing and would not take showers or dress without supervision. Facility would bring her meals daily.            OT Problem List: Decreased strength;Decreased activity tolerance;Impaired balance (sitting and/or standing);Decreased coordination;Decreased cognition;Decreased safety awareness;Decreased knowledge of use of DME or AE;Decreased knowledge of precautions      OT Treatment/Interventions: Self-care/ADL training;Therapeutic exercise;Energy conservation;DME and/or AE instruction;Therapeutic activities;Cognitive remediation/compensation;Patient/family education;Balance training    OT Goals(Current goals can be found in the care plan section) Acute Rehab OT Goals Patient Stated Goal: go for a walk OT Goal Formulation: With patient Time For Goal Achievement: 04/11/20 Potential to Achieve Goals: Good  OT Frequency: Min 2X/week    AM-PAC OT "6 Clicks" Daily Activity     Outcome Measure Help from another person eating meals?: A Little Help from another person taking care of personal grooming?: A Little Help from another person toileting, which includes using toliet, bedpan, or urinal?: Total Help from another person bathing (including washing, rinsing,  drying)?: A Lot Help from another person to put on and taking off regular upper body clothing?: A Little Help from another person to put on and taking off regular lower body clothing?: Total 6 Click Score: 13   End of  Session Equipment Utilized During Treatment: Gait belt;Rolling walker;Oxygen Nurse Communication: Mobility status  Activity Tolerance: Patient tolerated treatment well Patient left: in bed;with call bell/phone within reach;with bed alarm set  OT Visit Diagnosis: Unsteadiness on feet (R26.81);Other abnormalities of gait and mobility (R26.89);Muscle weakness (generalized) (M62.81);Other symptoms and signs involving cognitive function                Time: 3887-1959 OT Time Calculation (min): 27 min Charges:  OT General Charges $OT Visit: 1 Visit OT Evaluation $OT Eval Moderate Complexity: 1 Mod OT Treatments $Self Care/Home Management : 8-22 mins  Markella Dao/OTS  Mandy Peeks 03/28/2020, 11:33 AM

## 2020-03-28 NOTE — Progress Notes (Signed)
PROGRESS NOTE    Dawn Morrow  YCX:[id number] DOB: December 21, 1935 DOA: 03/26/2020 PCP: Colon Branch, MD   Brief Narrative:  HPI on 03/26/2020 by Dr. Ileene Musa Dawn Morrow is a 84 y.o. female with medical history significant for COPD with chronic hypoxemia on 3 L, atrial fibrillation, PE/DVT on Xarelto, hypertension, CKD stage 3a, type 2 diabetes, hypothyroidism, MGUS who presents with concerns of altered mental status.  Patient unable to provide any history as to why she is here in the ED.  Per ED documentation, nursing staff at her SNF thought that she was different from her baseline.  Needing more assistance with transfer.  She was last admitted to the hospital in April with altered mental status due to E. coli UTI. Assessment & Plan   Acute metabolic encephalopathy secondary to UTI -Back to baseline- currently AAOx3 -CT head unremarkable -Chest x-rayshowing cardiomegaly, but no infection -UA showing trace leukocytes, negative nitrites, few bacteria -Of note, patient was hospitalized recently for E. coli UTI -Urine culture >100k  Enterococcus faecalis- only sensitive to ampillicin, nitrofurantoin, vanc- however patient has a PCN allergy -will discontinue aztreonam and place patient on vancomycin -TSH 5.32, possibly reactive  Acute the injury on chronic kidney disease, stage IIIa -Creatinine 1.8 on admission, baseline is approximately 1.1 -Given IV fluids -Creatinine down to 1.35  Recurrent PE/DVT/atrial fibrillation -Continue Xarelto  Essential hypertension -BP currently stable  Hypothyroidism -TSH is elevated however this may be reactive -Would not change Synthroid dose at this time given possible infection -Should have repeat thyroid function tests as an outpatient  COPD with chronic respiratory hypoxic failure -Continue baseline of 3 L of nasal cannula  Diabetes mellitus, type II -Hemoglobin A1c was 6.1 in April 2021 -Appears to be controlled -Monitor CBGs, continue  insulin sliding scale  GERD -continue PPI  DVT Prophylaxis  Xarelto  Code Status: Full  Family Communication: None at bedside  Disposition Plan:  Status is: Inpatient  Remains inpatient appropriate because:IV treatments appropriate due to intensity of illness or inability to take PO   Dispo: The patient is from: ALF              Anticipated d/c is to: SNF              Anticipated d/c date is: 2 days              Patient currently is not medically stable to d/c.   Consultants None  Procedures  None  Antibiotics   Anti-infectives (From admission, onward)   Start     Dose/Rate Route Frequency Ordered Stop   03/28/20 1100  vancomycin (VANCOCIN) IVPB 1000 mg/200 mL premix     Discontinue     1,000 mg 200 mL/hr over 60 Minutes Intravenous Every 24 hours 03/28/20 0952 03/31/20 1059   03/26/20 2200  aztreonam (AZACTAM) 2 g in sodium chloride 0.9 % 100 mL IVPB  Status:  Discontinued        2 g 200 mL/hr over 30 Minutes Intravenous Every 8 hours 03/26/20 1953 03/28/20 0948      Subjective:   Layani Foronda seen and examined today.  Patient has no complaints today.  Would like to return home.  Denies current chest pain, shortness breath, abdominal pain, nausea or vomiting, diarrhea or constipation, dizziness or headache.   Objective:   Vitals:   03/27/20 2335 03/28/20 0413 03/28/20 0754 03/28/20 1151  BP: (!) 145/106 137/80 (!) 133/111 (!) 167/90  Pulse: (!) 106 83 81  87  Resp: 17 17 20 16   Temp: 98.1 F (36.7 C) 97.9 F (36.6 C) 97.8 F (36.6 C) 98.2 F (36.8 C)  TempSrc: Oral Oral Oral Oral  SpO2: 93% 100% 97% 100%    Intake/Output Summary (Last 24 hours) at 03/28/2020 1525 Last data filed at 03/27/2020 1806 Gross per 24 hour  Intake --  Output 600 ml  Net -600 ml   There were no vitals filed for this visit.  Exam  General: Well developed, well nourished, NAD, appears stated age  25: NCAT,  mucous membranes moist.   Cardiovascular: S1 S2 auscultated,  irregular  Respiratory: Clear to auscultation bilaterally with equal chest rise  Abdomen: Soft, nontender, nondistended, + bowel sounds  Extremities: warm dry without cyanosis clubbing or edema  Neuro: AAOx3, nonfocal  Psych: Appropriate mood and affect, pleasant   Data Reviewed: I have personally reviewed following labs and imaging studies  CBC: Recent Labs  Lab 03/26/20 1414 03/27/20 0427 03/28/20 0526  WBC 12.7* 13.6* 10.9*  NEUTROABS 10.0*  --   --   HGB 12.7 13.4 13.4  HCT 42.2 43.1 42.8  MCV 96.3 95.8 95.5  PLT 162 163 400   Basic Metabolic Panel: Recent Labs  Lab 03/26/20 1414 03/27/20 0427 03/28/20 0526  NA 140 140 140  K 4.0 4.0 4.0  CL 102 101 102  CO2 29 30 29   GLUCOSE 113* 108* 129*  BUN 26* 21 18  CREATININE 1.80* 1.51* 1.35*  CALCIUM 8.8* 8.7* 8.8*   GFR: CrCl cannot be calculated (Unknown ideal weight.). Liver Function Tests: Recent Labs  Lab 03/26/20 1414  AST 19  ALT 15  ALKPHOS 75  BILITOT 1.1  PROT 7.2  ALBUMIN 3.2*   No results for input(s): LIPASE, AMYLASE in the last 168 hours. No results for input(s): AMMONIA in the last 168 hours. Coagulation Profile: Recent Labs  Lab 03/26/20 1518  INR 1.3*   Cardiac Enzymes: No results for input(s): CKTOTAL, CKMB, CKMBINDEX, TROPONINI in the last 168 hours. BNP (last 3 results) No results for input(s): PROBNP in the last 8760 hours. HbA1C: No results for input(s): HGBA1C in the last 72 hours. CBG: Recent Labs  Lab 03/26/20 1424  GLUCAP 109*   Lipid Profile: No results for input(s): CHOL, HDL, LDLCALC, TRIG, CHOLHDL, LDLDIRECT in the last 72 hours. Thyroid Function Tests: Recent Labs    03/26/20 1415  TSH 5.320*   Anemia Panel: No results for input(s): VITAMINB12, FOLATE, FERRITIN, TIBC, IRON, RETICCTPCT in the last 72 hours. Urine analysis:    Component Value Date/Time   COLORURINE YELLOW 03/26/2020 1705   APPEARANCEUR HAZY (A) 03/26/2020 1705   LABSPEC 1.019  03/26/2020 1705   PHURINE 5.0 03/26/2020 1705   GLUCOSEU NEGATIVE 03/26/2020 1705   GLUCOSEU NEGATIVE 06/12/2014 1410   HGBUR SMALL (A) 03/26/2020 1705   BILIRUBINUR NEGATIVE 03/26/2020 1705   BILIRUBINUR Neg 06/19/2016 1101   KETONESUR NEGATIVE 03/26/2020 1705   PROTEINUR 30 (A) 03/26/2020 1705   UROBILINOGEN >=8.0 06/19/2016 1101   UROBILINOGEN 0.2 05/31/2015 1835   NITRITE NEGATIVE 03/26/2020 1705   LEUKOCYTESUR TRACE (A) 03/26/2020 1705   Sepsis Labs: @LABRCNTIP (procalcitonin:4,lacticidven:4)  ) Recent Results (from the past 240 hour(s))  Urine culture     Status: Abnormal   Collection Time: 03/26/20  5:05 PM   Specimen: Urine, Catheterized  Result Value Ref Range Status   Specimen Description URINE, CATHETERIZED  Final   Special Requests   Final    NONE Performed at [facility], [place] [address] [place], [place] [address]    Culture >=100,000 COLONIES/mL ENTEROCOCCUS FAECALIS (A)  Final   Report Status 03/28/2020 FINAL  Final   Organism ID, Bacteria ENTEROCOCCUS FAECALIS (A)  Final      Susceptibility   Enterococcus faecalis - MIC*    AMPICILLIN <=2 SENSITIVE Sensitive     NITROFURANTOIN <=16 SENSITIVE Sensitive     VANCOMYCIN 1 SENSITIVE Sensitive     * >=100,000 COLONIES/mL ENTEROCOCCUS FAECALIS  SARS Coronavirus 2 by RT PCR (hospital order, performed in [facility] hospital lab) Nasopharyngeal Nasopharyngeal Swab     Status: None   Collection Time: 03/26/20  7:54 PM   Specimen: Nasopharyngeal Swab  Result Value Ref Range Status   SARS Coronavirus 2 NEGATIVE NEGATIVE Final    Comment: (NOTE) SARS-CoV-2 target nucleic acids are NOT DETECTED.  The SARS-CoV-2 RNA is generally detectable in upper and lower respiratory specimens during the acute phase of infection. The lowest concentration of SARS-CoV-2 viral copies this assay can detect is 250 copies / mL. A negative result does not preclude SARS-CoV-2 infection and should not be used as the sole basis for  treatment or other patient management decisions.  A negative result may occur with improper specimen collection / handling, submission of specimen other than nasopharyngeal swab, presence of viral mutation(s) within the areas targeted by this assay, and inadequate number of viral copies (<250 copies / mL). A negative result must be combined with clinical observations, patient history, and epidemiological information.  Fact Sheet for Patients:   StrictlyIdeas.no  Fact Sheet for Healthcare Providers: BankingDealers.co.za  This test is not yet approved or  cleared by the [place] FDA and has been authorized for detection and/or diagnosis of SARS-CoV-2 by FDA under an Emergency Use Authorization (EUA).  This EUA will remain in effect (meaning this test can be used) for the duration of the COVID-19 declaration under Section 564(b)(1) of the Act, 21 U.S.C. section 360bbb-3(b)(1), unless the authorization is terminated or revoked sooner.  Performed at [facility], [place] [address], [place], [place] [address]   MRSA PCR Screening     Status: Abnormal   Collection Time: 03/27/20 12:37 AM   Specimen: Nasal Mucosa; Nasopharyngeal  Result Value Ref Range Status   MRSA by PCR POSITIVE (A) NEGATIVE Final    Comment: CRITICAL RESULT CALLED TO, READ BACK BY AND VERIFIED WITH: RN Teodora Medici [phone] @0216  THANEY Performed at [place] [address], [place], [place] [address]       Radiology Studies: CT Head Wo Contrast  Result Date: 03/26/2020 CLINICAL DATA:  Altered mental status EXAM: CT HEAD WITHOUT CONTRAST TECHNIQUE: Contiguous axial images were obtained from the base of the skull through the vertex without intravenous contrast. COMPARISON:  CT brain 12/14/2019, MRI brain 12/16/2019 FINDINGS: Brain: No acute territorial infarction, hemorrhage or intracranial mass. Marked hypodensity in the white matter consistent  with chronic small vessel ischemic change. Chronic appearing lacunar infarcts in the basal ganglia. Stable ventricle size. Vascular: No hyperdense vessels.  Carotid vascular calcification. Skull: Normal. Negative for fracture or focal lesion. Sinuses/Orbits: No acute finding. Other: None IMPRESSION: 1. No CT evidence for acute intracranial abnormality. 2. Chronic small vessel ischemic changes of the white matter. Electronically Signed   By: Donavan Foil M.D.   On: 03/26/2020 15:53     Scheduled Meds: . atorvastatin  10 mg Oral QHS  . buPROPion  450 mg Oral Daily  . carvedilol  6.25  mg Oral BID WC  . Chlorhexidine Gluconate Cloth  6 each Topical Q0600  . docusate sodium  100 mg Oral Daily  . ezetimibe  10 mg Oral Daily  . gabapentin  300 mg Oral BID WC  . gabapentin  600 mg Oral QHS  . [START ON 03/29/2020] levothyroxine  88 mcg Oral QAC breakfast  . mupirocin ointment  1 application Nasal BID  . rivaroxaban  20 mg Oral Q supper  . sertraline  75 mg Oral Q1200   Continuous Infusions: . vancomycin 1,000 mg (03/28/20 1025)     LOS: 1 day   Time Spent in minutes   30 minutes  Nalah Macioce D.O. on 03/28/2020 at 3:25 PM  Between 7am to 7pm - Please see pager noted on [email or web address]  After 7pm go to [email or web address]  And look for the night coverage person covering for me after hours  [facility] Office  [phone]

## 2020-03-28 NOTE — TOC Initial Note (Signed)
Transition of Care [facility]) - Initial/Assessment Note    Patient Details  Name: Dawn Morrow MRN: [id number] Date of Birth: 11-16-1935  [facility]) CM/SW Contact:    Emeterio Reeve, [place] Phone Number: 03/28/2020, 12:50 PM  Clinical Narrative:                  CSW spoke to [place] at [place] at [place]. Blair Promise stated that prior to admission pt was wheelchair bound but could self propel and transfer independently. Blair Promise stated that pt completed most ADL's on her self and only needed assistance with getting in and out of the shower. She stated that pt would have to be prompted sometimes. Blair Promise stated pt was pt was continent but does wear depends in case of an accident.   CSW attempted to all niece and nephew but did not get an answer, csw left message.   Current pt/ot reccs are for SNF. CSW faxed PT notes to [facility] [phone]) to see if they would be able to accommodate pt with HHPT.   CSW will continue to follow.     Barriers to Discharge: Continued Medical Work up   Patient Goals and CMS Choice Patient states their goals for this hospitalization and ongoing recovery are:: pt did not express goals CMS Medicare.gov Compare Post Acute Care list provided to:: Patient Choice offered to / list presented to : Patient  Expected Discharge Plan and Services         Living arrangements for the past 2 months: [place]                                      Prior Living Arrangements/Services Living arrangements for the past 2 months: [place] Lives with:: Facility Resident Patient language and need for interpreter reviewed:: Yes Do you feel safe going back to the place where you live?: Yes      Need for Family Participation in Patient Care: Yes (Comment) Care giver support system in place?: Yes (comment)   Criminal Activity/Legal Involvement Pertinent to Current Situation/Hospitalization: No - Comment as needed  Activities of  Daily Living      Permission Sought/Granted Permission sought to share information with : Facility Sport and exercise psychologist                Emotional Assessment Appearance:: Appears stated age Attitude/Demeanor/Rapport: Unable to Assess Affect (typically observed): Unable to Assess Orientation: : Oriented to Self, Oriented to Place Alcohol / Substance Use: Not Applicable Psych Involvement: No (comment)  Admission diagnosis:  Acute cystitis with hematuria [N30.01] AKI (acute kidney injury) ([place]) [N17.9] Altered mental status, unspecified altered mental status type [R41.82] AMS (altered mental status) [R41.82] Patient Active Problem List   Diagnosis Date Noted  . AMS (altered mental status) 03/26/2020  . History of pulmonary embolism 03/26/2020  . History of DVT (deep vein thrombosis) 03/26/2020  . Atrial fibrillation, chronic ([place]) 03/26/2020  . Sepsis ([place]) 12/14/2019  . Acute encephalopathy 12/14/2019  . COVID-19 virus infection 10/02/2019  . Acute lower UTI 10/02/2019  . Acute kidney failure ([place]) 10/01/2019  . Syncope 09/18/2017  . Near syncope 09/17/2017  . Diabetes mellitus type 2 in obese ([place]) 09/17/2017  . Atrial fibrillation with RVR ([place]) 09/17/2017  . Hypomagnesemia 09/17/2017  . CKD (chronic kidney disease), stage III 09/17/2017  . Forehead contusion   . MGUS (monoclonal gammopathy of unknown significance) 08/13/2016  .  Renal failure 07/09/2015  . Follow-up ---------PCP NOTES 05/17/2015  . Essential tremor 12/28/2014  . Ulnar neuropathy of left upper extremity 12/28/2014  . Intractable pain 06/21/2014  . Back pain 06/21/2014  . Lumbar radiculopathy 06/21/2014  . Encounter for therapeutic drug monitoring 03/14/2014  . Numbness 10/18/2013  . TIA (transient ischemic attack) 09/19/2013  . Weakness 09/18/2013  . Annual physical exam 05/30/2012  . Tremor 01/21/2012  . Failure to thrive and poor med compliance  01/21/2012  . PE (pulmonary embolism)  11/18/2010  . DVT (deep venous thrombosis) ([place]) 11/18/2010  . Long term current use of anticoagulant 11/18/2010  . ABNORMAL ELECTROCARDIOGRAM 11/10/2010  . OSTEOARTHRITIS 08/06/2010  . DIZZINESS 04/15/2010  . Diabetes ([place]) 06/04/2009  . ACNE ROSACEA 11/28/2008  . BACK PAIN 05/16/2008  . HIP PAIN, RIGHT, CHRONIC 09/15/2007  . Hypothyroidism 09/13/2007  . Dyslipidemia 09/13/2007  . Osteoporosis 07/22/2007  . DEPRESSION 09/11/2006  . HTN (hypertension) 09/11/2006  . ASTHMA 09/11/2006  . COPD (chronic obstructive pulmonary disease) ([place]) 09/11/2006  . GERD 09/11/2006  . Recurrent UTI 09/11/2006  . [place] FILTER INSERTION, HX OF 09/11/2006   PCP:  Colon Branch, MD Pharmacy:  No Pharmacies Listed    Social Determinants of Health (SDOH) Interventions    Readmission Risk Interventions Readmission Risk Prevention Plan 10/03/2019  Transportation Screening Complete  PCP or Specialist Appt within 3-5 Days Complete  HRI or [place] Not Complete  [place] or Home Care Consult comments Patient at her baseline  Social Work Consult for [place] Planning/Counseling [place] Not Applicable  Medication Review Press photographer) Referral to Pharmacy  Some recent data might be hidden   Emeterio Reeve, Latanya Presser, [place] Social Worker [phone]

## 2020-03-29 ENCOUNTER — Inpatient Hospital Stay (HOSPITAL_COMMUNITY): Payer: Medicare Other

## 2020-03-29 DIAGNOSIS — J449 Chronic obstructive pulmonary disease, unspecified: Secondary | ICD-10-CM | POA: Diagnosis not present

## 2020-03-29 DIAGNOSIS — E1159 Type 2 diabetes mellitus with other circulatory complications: Secondary | ICD-10-CM | POA: Diagnosis not present

## 2020-03-29 DIAGNOSIS — I482 Chronic atrial fibrillation, unspecified: Secondary | ICD-10-CM | POA: Diagnosis not present

## 2020-03-29 DIAGNOSIS — R4182 Altered mental status, unspecified: Secondary | ICD-10-CM | POA: Diagnosis not present

## 2020-03-29 LAB — BASIC METABOLIC PANEL
Anion gap: 9 (ref 5–15)
BUN: 21 mg/dL (ref 8–23)
CO2: 26 mmol/L (ref 22–32)
Calcium: 8.6 mg/dL — ABNORMAL LOW (ref 8.9–10.3)
Chloride: 102 mmol/L (ref 98–111)
Creatinine, Ser: 1.31 mg/dL — ABNORMAL HIGH (ref 0.44–1.00)
GFR calc Af Amer: 44 mL/min — ABNORMAL LOW (ref >60)
GFR calc non Af Amer: 38 mL/min — ABNORMAL LOW (ref >60)
Glucose, Bld: 186 mg/dL — ABNORMAL HIGH (ref 70–99)
Potassium: 4.3 mmol/L (ref 3.5–5.1)
Sodium: 137 mmol/L (ref 135–145)

## 2020-03-29 LAB — CBC
HCT: 39.7 % (ref 36.0–46.0)
Hemoglobin: 12.2 g/dL (ref 12.0–15.0)
MCH: 29 pg (ref 26.0–34.0)
MCHC: 30.7 g/dL (ref 30.0–36.0)
MCV: 94.5 fL (ref 80.0–100.0)
Platelets: 177 10*3/uL (ref 150–400)
RBC: 4.2 MIL/uL (ref 3.87–5.11)
RDW: 17.4 % — ABNORMAL HIGH (ref 11.5–15.5)
WBC: 11 10*3/uL — ABNORMAL HIGH (ref 4.0–10.5)
nRBC: 0 % (ref 0.0–0.2)

## 2020-03-29 NOTE — Progress Notes (Signed)
PROGRESS NOTE    Dawn Morrow  OZH:[id number] DOB: 10/13/1935 DOA: 03/26/2020 PCP: Colon Branch, MD   Brief Narrative:  HPI on 03/26/2020 by Dr. Ileene Musa Dawn Morrow is a 84 y.o. female with medical history significant for COPD with chronic hypoxemia on 3 L, atrial fibrillation, PE/DVT on Xarelto, hypertension, CKD stage 3a, type 2 diabetes, hypothyroidism, MGUS who presents with concerns of altered mental status.  Patient unable to provide any history as to why she is here in the ED.  Per ED documentation, nursing staff at her SNF thought that she was different from her baseline.  Needing more assistance with transfer.  She was last admitted to the hospital in April with altered mental status due to E. coli UTI. Assessment & Plan   Acute metabolic encephalopathy secondary to UTI -Back to baseline- currently AAOx3 -CT head unremarkable -Chest x-rayshowing cardiomegaly, but no infection -UA showing trace leukocytes, negative nitrites, few bacteria -Of note, patient was hospitalized recently for E. coli UTI -Urine culture >100k  Enterococcus faecalis- only sensitive to ampillicin, nitrofurantoin, vanc- however patient has a PCN allergy -will discontinue aztreonam and place patient on vancomycin-for 3 days -TSH 5.32, possibly reactive  Acute the injury on chronic kidney disease, stage IIIa -Creatinine 1.8 on admission, baseline is approximately 1.1 -Given IV fluids -Creatinine down to 1.35 -Pending BMP today  Recurrent PE/DVT/atrial fibrillation -Continue Xarelto  Essential hypertension -BP currently stable  Hypothyroidism -TSH is elevated however this may be reactive -Would not change Synthroid dose at this time given possible infection -Should have repeat thyroid function tests as an outpatient  COPD with chronic respiratory hypoxic failure -Continue baseline of 3 L of nasal cannula  Diabetes mellitus, type II -Hemoglobin A1c was 6.1 in April 2021 -Appears to be  controlled -Monitor CBGs, continue insulin sliding scale  GERD -continue PPI  DVT Prophylaxis  Xarelto  Code Status: Full  Family Communication: None at bedside  Disposition Plan:  Status is: Inpatient  Remains inpatient appropriate because:IV treatments appropriate due to intensity of illness or inability to take PO   Dispo: The patient is from: ALF              Anticipated d/c is to: SNF              Anticipated d/c date is: 2 days              Patient currently is not medically stable to d/c.   Consultants None  Procedures  None  Antibiotics   Anti-infectives (From admission, onward)   Start     Dose/Rate Route Frequency Ordered Stop   03/28/20 1100  vancomycin (VANCOCIN) IVPB 1000 mg/200 mL premix     Discontinue     1,000 mg 200 mL/hr over 60 Minutes Intravenous Every 24 hours 03/28/20 0952 03/31/20 1059   03/26/20 2200  aztreonam (AZACTAM) 2 g in sodium chloride 0.9 % 100 mL IVPB  Status:  Discontinued        2 g 200 mL/hr over 30 Minutes Intravenous Every 8 hours 03/26/20 1953 03/28/20 0948      Subjective:   Dawn Morrow seen and examined today.  Patient would like to go home. Denies current chest pain, shortness of breath, abdominal pain, nausea vomiting, diarrhea constipation, dizziness or headache.   Objective:   Vitals:   03/28/20 2341 03/29/20 0326 03/29/20 0835 03/29/20 1207  BP: 104/80 (!) 143/78 (!) 136/90 (!) 129/82  Pulse: 82 86 72 79  Resp:  16 16 18 18   Temp: 98.6 F (37 C) 99.9 F (37.7 C) 98.1 F (36.7 C) 98.4 F (36.9 C)  TempSrc: Oral Oral  Oral  SpO2: 90% (!) 84% 95% 94%    Intake/Output Summary (Last 24 hours) at 03/29/2020 1329 Last data filed at 03/28/2020 1914 Gross per 24 hour  Intake 200 ml  Output 750 ml  Net -550 ml   There were no vitals filed for this visit.  Exam  General: Well developed, well nourished, NAD, appears stated age  48: NCAT, mucous membranes moist.   Cardiovascular: S1 S2 auscultated,  irregular  Respiratory: Clear to auscultation bilaterally   Abdomen: Soft, nontender, nondistended, + bowel sounds  Extremities: warm dry without cyanosis clubbing or edema  Neuro: AAOx3, nonfocal, although slow to answer  Psych: Pleasant, appropriate mood and affect  Data Reviewed: I have personally reviewed following labs and imaging studies  CBC: Recent Labs  Lab 03/26/20 1414 03/27/20 0427 03/28/20 0526 03/29/20 1241  WBC 12.7* 13.6* 10.9* 11.0*  NEUTROABS 10.0*  --   --   --   HGB 12.7 13.4 13.4 12.2  HCT 42.2 43.1 42.8 39.7  MCV 96.3 95.8 95.5 94.5  PLT 162 163 170 948   Basic Metabolic Panel: Recent Labs  Lab 03/26/20 1414 03/27/20 0427 03/28/20 0526  NA 140 140 140  K 4.0 4.0 4.0  CL 102 101 102  CO2 29 30 29   GLUCOSE 113* 108* 129*  BUN 26* 21 18  CREATININE 1.80* 1.51* 1.35*  CALCIUM 8.8* 8.7* 8.8*   GFR: CrCl cannot be calculated (Unknown ideal weight.). Liver Function Tests: Recent Labs  Lab 03/26/20 1414  AST 19  ALT 15  ALKPHOS 75  BILITOT 1.1  PROT 7.2  ALBUMIN 3.2*   No results for input(s): LIPASE, AMYLASE in the last 168 hours. No results for input(s): AMMONIA in the last 168 hours. Coagulation Profile: Recent Labs  Lab 03/26/20 1518  INR 1.3*   Cardiac Enzymes: No results for input(s): CKTOTAL, CKMB, CKMBINDEX, TROPONINI in the last 168 hours. BNP (last 3 results) No results for input(s): PROBNP in the last 8760 hours. HbA1C: No results for input(s): HGBA1C in the last 72 hours. CBG: Recent Labs  Lab 03/26/20 1424  GLUCAP 109*   Lipid Profile: No results for input(s): CHOL, HDL, LDLCALC, TRIG, CHOLHDL, LDLDIRECT in the last 72 hours. Thyroid Function Tests: Recent Labs    03/26/20 1415  TSH 5.320*   Anemia Panel: No results for input(s): VITAMINB12, FOLATE, FERRITIN, TIBC, IRON, RETICCTPCT in the last 72 hours. Urine analysis:    Component Value Date/Time   COLORURINE YELLOW 03/26/2020 1705   APPEARANCEUR  HAZY (A) 03/26/2020 1705   LABSPEC 1.019 03/26/2020 1705   PHURINE 5.0 03/26/2020 1705   GLUCOSEU NEGATIVE 03/26/2020 1705   GLUCOSEU NEGATIVE 06/12/2014 1410   HGBUR SMALL (A) 03/26/2020 1705   BILIRUBINUR NEGATIVE 03/26/2020 1705   BILIRUBINUR Neg 06/19/2016 1101   KETONESUR NEGATIVE 03/26/2020 1705   PROTEINUR 30 (A) 03/26/2020 1705   UROBILINOGEN >=8.0 06/19/2016 1101   UROBILINOGEN 0.2 05/31/2015 1835   NITRITE NEGATIVE 03/26/2020 1705   LEUKOCYTESUR TRACE (A) 03/26/2020 1705   Sepsis Labs: @LABRCNTIP (procalcitonin:4,lacticidven:4)  ) Recent Results (from the past 240 hour(s))  Urine culture     Status: Abnormal   Collection Time: 03/26/20  5:05 PM   Specimen: Urine, Catheterized  Result Value Ref Range Status   Specimen Description URINE, CATHETERIZED  Final   Special Requests   Final  NONE Performed at [facility], [place] [address], [place], [place] [address]    Culture >=100,000 COLONIES/mL ENTEROCOCCUS FAECALIS (A)  Final   Report Status 03/28/2020 FINAL  Final   Organism ID, Bacteria ENTEROCOCCUS FAECALIS (A)  Final      Susceptibility   Enterococcus faecalis - MIC*    AMPICILLIN <=2 SENSITIVE Sensitive     NITROFURANTOIN <=16 SENSITIVE Sensitive     VANCOMYCIN 1 SENSITIVE Sensitive     * >=100,000 COLONIES/mL ENTEROCOCCUS FAECALIS  SARS Coronavirus 2 by RT PCR (hospital order, performed in [place] hospital lab) Nasopharyngeal Nasopharyngeal Swab     Status: None   Collection Time: 03/26/20  7:54 PM   Specimen: Nasopharyngeal Swab  Result Value Ref Range Status   SARS Coronavirus 2 NEGATIVE NEGATIVE Final    Comment: (NOTE) SARS-CoV-2 target nucleic acids are NOT DETECTED.  The SARS-CoV-2 RNA is generally detectable in upper and lower respiratory specimens during the acute phase of infection. The lowest concentration of SARS-CoV-2 viral copies this assay can detect is 250 copies / mL. A negative result does not preclude SARS-CoV-2 infection and  should not be used as the sole basis for treatment or other patient management decisions.  A negative result may occur with improper specimen collection / handling, submission of specimen other than nasopharyngeal swab, presence of viral mutation(s) within the areas targeted by this assay, and inadequate number of viral copies (<250 copies / mL). A negative result must be combined with clinical observations, patient history, and epidemiological information.  Fact Sheet for Patients:   StrictlyIdeas.no  Fact Sheet for Healthcare Providers: BankingDealers.co.za  This test is not yet approved or  cleared by the [place] FDA and has been authorized for detection and/or diagnosis of SARS-CoV-2 by FDA under an Emergency Use Authorization (EUA).  This EUA will remain in effect (meaning this test can be used) for the duration of the COVID-19 declaration under Section 564(b)(1) of the Act, 21 U.S.C. section 360bbb-3(b)(1), unless the authorization is terminated or revoked sooner.  Performed at [facility], [place] [address], [place], [place] [address]   MRSA PCR Screening     Status: Abnormal   Collection Time: 03/27/20 12:37 AM   Specimen: Nasal Mucosa; Nasopharyngeal  Result Value Ref Range Status   MRSA by PCR POSITIVE (A) NEGATIVE Final    Comment: CRITICAL RESULT CALLED TO, READ BACK BY AND VERIFIED WITH: RN Teodora Medici [id number] @0216  THANEY Performed at [facility] [address], [place], [place] [address]       Radiology Studies: No results found.   Scheduled Meds:  atorvastatin  10 mg Oral QHS   buPROPion  450 mg Oral Daily   carvedilol  6.25 mg Oral BID WC   Chlorhexidine Gluconate Cloth  6 each Topical Q0600   docusate sodium  100 mg Oral Daily   ezetimibe  10 mg Oral Daily   gabapentin  300 mg Oral BID WC   gabapentin  600 mg Oral QHS   levothyroxine  88 mcg Oral QAC breakfast    mupirocin ointment  1 application Nasal BID   rivaroxaban  20 mg Oral Q supper   sertraline  75 mg Oral Q1200   Continuous Infusions:  vancomycin 1,000 mg (03/29/20 1137)     LOS: 2 days   Time Spent in minutes   30 minutes  Morrison Masser D.O. on 03/29/2020 at 1:29 PM  Between 7am to 7pm - Please see pager  noted on [email or web address]  After 7pm go to [email or web address]  And look for the night coverage person covering for me after hours  [facility] Office  [phone]

## 2020-03-30 ENCOUNTER — Inpatient Hospital Stay (HOSPITAL_COMMUNITY): Payer: Medicare Other

## 2020-03-30 DIAGNOSIS — J449 Chronic obstructive pulmonary disease, unspecified: Secondary | ICD-10-CM | POA: Diagnosis not present

## 2020-03-30 DIAGNOSIS — E1159 Type 2 diabetes mellitus with other circulatory complications: Secondary | ICD-10-CM | POA: Diagnosis not present

## 2020-03-30 DIAGNOSIS — I482 Chronic atrial fibrillation, unspecified: Secondary | ICD-10-CM | POA: Diagnosis not present

## 2020-03-30 DIAGNOSIS — R4182 Altered mental status, unspecified: Secondary | ICD-10-CM | POA: Diagnosis not present

## 2020-03-30 LAB — CBC
HCT: 39.8 % (ref 36.0–46.0)
Hemoglobin: 12.1 g/dL (ref 12.0–15.0)
MCH: 28.8 pg (ref 26.0–34.0)
MCHC: 30.4 g/dL (ref 30.0–36.0)
MCV: 94.8 fL (ref 80.0–100.0)
Platelets: 181 10*3/uL (ref 150–400)
RBC: 4.2 MIL/uL (ref 3.87–5.11)
RDW: 17.3 % — ABNORMAL HIGH (ref 11.5–15.5)
WBC: 13 10*3/uL — ABNORMAL HIGH (ref 4.0–10.5)
nRBC: 0 % (ref 0.0–0.2)

## 2020-03-30 LAB — BASIC METABOLIC PANEL
Anion gap: 10 (ref 5–15)
BUN: 26 mg/dL — ABNORMAL HIGH (ref 8–23)
CO2: 28 mmol/L (ref 22–32)
Calcium: 8.7 mg/dL — ABNORMAL LOW (ref 8.9–10.3)
Chloride: 101 mmol/L (ref 98–111)
Creatinine, Ser: 1.55 mg/dL — ABNORMAL HIGH (ref 0.44–1.00)
GFR calc Af Amer: 36 mL/min — ABNORMAL LOW (ref >60)
GFR calc non Af Amer: 31 mL/min — ABNORMAL LOW (ref >60)
Glucose, Bld: 129 mg/dL — ABNORMAL HIGH (ref 70–99)
Potassium: 4.4 mmol/L (ref 3.5–5.1)
Sodium: 139 mmol/L (ref 135–145)

## 2020-03-30 LAB — TROPONIN I (HIGH SENSITIVITY)
Troponin I (High Sensitivity): 11 ng/L (ref <18)
Troponin I (High Sensitivity): 8 ng/L (ref <18)

## 2020-03-30 MED ORDER — RIVAROXABAN 20 MG PO TABS
20.0000 mg | ORAL_TABLET | Freq: Every day | ORAL | Status: DC
  Administered 2020-03-30 – 2020-04-03 (×5): 20 mg via ORAL
  Filled 2020-03-30 (×5): qty 1

## 2020-03-30 MED ORDER — SODIUM CHLORIDE 0.9 % IV SOLN: INTRAVENOUS | Status: DC

## 2020-03-30 MED ORDER — RIVAROXABAN 15 MG PO TABS: 15.0000 mg | ORAL_TABLET | Freq: Every day | ORAL | Status: DC

## 2020-03-30 MED ORDER — DILTIAZEM LOAD VIA INFUSION: 10.0000 mg | Freq: Once | INTRAVENOUS | Status: DC

## 2020-03-30 MED ORDER — DILTIAZEM HCL 25 MG/5ML IV SOLN
10.0000 mg | Freq: Once | INTRAVENOUS | Status: AC
  Administered 2020-03-30: 10 mg via INTRAVENOUS
  Filled 2020-03-30: qty 5

## 2020-03-30 NOTE — TOC Progression Note (Signed)
Transition of Care [facility]) - Progression Note    Patient Details  Name: Dawn Morrow MRN: [id number] Date of Birth: 1935/12/26  Transition of Care [facility]) CM/SW [place], [place] Phone Number: 03/30/2020, 12:48 PM  Clinical Narrative:     CSW received voice message from Nia at [facility]. Nia stated that they believe it will be in the pts best interest to go to SNF before returning to [place].   CSW discussed this with the pt and and she agreed to go to SNF and stated she does not have a preference. CSW informed pt she will fax her out to facilities in the area and present her with snf choice. Pt stated when she has options she will call her nephew to discuss placement.    Barriers to Discharge: Continued Medical Work up  Expected Discharge Plan and Services         Living arrangements for the past 2 months: [place]                                       Social Determinants of Health (SDOH) Interventions    Readmission Risk Interventions Readmission Risk Prevention Plan 10/03/2019  Transportation Screening Complete  PCP or Specialist Appt within 3-5 Days Complete  HRI or [place] Not Complete  HRI or Home Care Consult comments Patient at her baseline  Social Work Consult for [place] Planning/Counseling [place] Not Applicable  Medication Review [employer]) Referral to Pharmacy  Some recent data might be hidden   Emeterio Reeve, Latanya Presser, [place] Social Worker [phone]

## 2020-03-30 NOTE — Progress Notes (Signed)
PROGRESS NOTE    Dawn Morrow  GMW:[id number] DOB: 09-22-1935 DOA: 03/26/2020 PCP: Colon Branch, MD   Brief Narrative:  HPI on 03/26/2020 by Dr. Ileene Musa Dawn Morrow is a 84 y.o. female with medical history significant for COPD with chronic hypoxemia on 3 L, atrial fibrillation, PE/DVT on Xarelto, hypertension, CKD stage 3a, type 2 diabetes, hypothyroidism, MGUS who presents with concerns of altered mental status.  Patient unable to provide any history as to why she is here in the ED.  Per ED documentation, nursing staff at her SNF thought that she was different from her baseline.  Needing more assistance with transfer.  She was last admitted to the hospital in April with altered mental status due to E. coli UTI. Assessment & Plan   Acute metabolic encephalopathy secondary to UTI -Back to baseline- currently AAOx3 -CT head unremarkable -Chest x-rayshowing cardiomegaly, but no infection -UA showing trace leukocytes, negative nitrites, few bacteria -Of note, patient was hospitalized recently for E. coli UTI -Urine culture >100k  Enterococcus faecalis- only sensitive to ampillicin, nitrofurantoin, vanc- however patient has a PCN allergy -will discontinue aztreonam and place patient on vancomycin-for 3 days -TSH 5.32, possibly reactive  Acute the injury on chronic kidney disease, stage IIIa -Creatinine 1.8 on admission, baseline is approximately 1.1 -Given IV fluids -Creatinine 1.55 -Continue to monitor BMP  Recurrent PE/DVT/atrial fibrillation -Continue Xarelto -overnight, patient noted to have heart rate in the 110-120 range and was given 10 mg of IV Cardizem -Troponins obtained and were unremarkable  Leukocytosis -WBC up to 13 today -Currently on vancomycin -blood cultures obtained  -patient also noted to have mild erythema of the right foot- Xray obtained and unremarkable -will obtain CXR today -continue to monitor CBC  Essential hypertension -BP currently  stable  Hypothyroidism -TSH is elevated however this may be reactive -Would not change Synthroid dose at this time given possible infection -Should have repeat thyroid function tests as an outpatient  COPD with chronic respiratory hypoxic failure -Continue baseline of 3 L of nasal cannula  Diabetes mellitus, type II -Hemoglobin A1c was 6.1 in April 2021 -Appears to be controlled -Monitor CBGs, continue insulin sliding scale  GERD -continue PPI  DVT Prophylaxis  Xarelto  Code Status: Full  Family Communication: None at bedside  Disposition Plan:  Status is: Inpatient  Remains inpatient appropriate because:IV treatments appropriate due to intensity of illness or inability to take PO   Dispo: The patient is from: ALF              Anticipated d/c is to: SNF              Anticipated d/c date is: 2 days              Patient currently is not medically stable to d/c.   Consultants None  Procedures  None  Antibiotics   Anti-infectives (From admission, onward)   Start     Dose/Rate Route Frequency Ordered Stop   03/28/20 1100  vancomycin (VANCOCIN) IVPB 1000 mg/200 mL premix        1,000 mg 200 mL/hr over 60 Minutes Intravenous Every 24 hours 03/28/20 0952 03/30/20 1131   03/26/20 2200  aztreonam (AZACTAM) 2 g in sodium chloride 0.9 % 100 mL IVPB  Status:  Discontinued        2 g 200 mL/hr over 30 Minutes Intravenous Every 8 hours 03/26/20 1953 03/28/20 4403      Subjective:   [name] seen and  examined today.  Patient feels tired this morning.  Denies current chest pain or shortness of breath, abdominal pain, nausea or vomiting, diarrhea constipation, dizziness or headache.  Denies any pain or fall or recent trauma to her right foot.  States it swells from time to time.   Objective:   Vitals:   03/30/20 0138 03/30/20 0332 03/30/20 0908 03/30/20 1311  BP: 124/70 (!) 116/62 114/75 (!) 110/95  Pulse: 77 79 78 96  Resp: 18 18 18 18   Temp: 99 F (37.2 C) 98.9 F  (37.2 C) 97.7 F (36.5 C) 97.6 F (36.4 C)  TempSrc: Oral Axillary Oral Oral  SpO2: 97% 97% 96% (!) 88%  Weight:        Intake/Output Summary (Last 24 hours) at 03/30/2020 1330 Last data filed at 03/29/2020 2102 Gross per 24 hour  Intake 200 ml  Output 350 ml  Net -150 ml   Filed Weights   03/29/20 1635  Weight: (!) 94.8 kg   Exam  General: Well developed, well nourished, NAD, appears stated age  HEENT: NCAT,mucous membranes moist.   Cardiovascular: S1 S2 auscultated, irregular  Respiratory: Clear to auscultation bilaterally  Abdomen: Soft, nontender, nondistended, + bowel sounds  Extremities: warm dry without cyanosis clubbing of LLE.  Right pedal edema mild with mild erythema  Neuro: AAOx3, nonfocal  Psych: Pleasant, appropriate mood and affect  Data Reviewed: I have personally reviewed following labs and imaging studies  CBC: Recent Labs  Lab 03/26/20 1414 03/27/20 0427 03/28/20 0526 03/29/20 1241 03/30/20 0212  WBC 12.7* 13.6* 10.9* 11.0* 13.0*  NEUTROABS 10.0*  --   --   --   --   HGB 12.7 13.4 13.4 12.2 12.1  HCT 42.2 43.1 42.8 39.7 39.8  MCV 96.3 95.8 95.5 94.5 94.8  PLT 162 163 170 177 010   Basic Metabolic Panel: Recent Labs  Lab 03/26/20 1414 03/27/20 0427 03/28/20 0526 03/29/20 1241 03/30/20 0212  NA 140 140 140 137 139  K 4.0 4.0 4.0 4.3 4.4  CL 102 101 102 102 101  CO2 29 30 29 26 28   GLUCOSE 113* 108* 129* 186* 129*  BUN 26* 21 18 21  26*  CREATININE 1.80* 1.51* 1.35* 1.31* 1.55*  CALCIUM 8.8* 8.7* 8.8* 8.6* 8.7*   GFR: Estimated Creatinine Clearance: 31.2 mL/min (A) (by C-G formula based on SCr of 1.55 mg/dL (H)). Liver Function Tests: Recent Labs  Lab 03/26/20 1414  AST 19  ALT 15  ALKPHOS 75  BILITOT 1.1  PROT 7.2  ALBUMIN 3.2*   No results for input(s): LIPASE, AMYLASE in the last 168 hours. No results for input(s): AMMONIA in the last 168 hours. Coagulation Profile: Recent Labs  Lab 03/26/20 1518  INR 1.3*    Cardiac Enzymes: No results for input(s): CKTOTAL, CKMB, CKMBINDEX, TROPONINI in the last 168 hours. BNP (last 3 results) No results for input(s): PROBNP in the last 8760 hours. HbA1C: No results for input(s): HGBA1C in the last 72 hours. CBG: Recent Labs  Lab 03/26/20 1424  GLUCAP 109*   Lipid Profile: No results for input(s): CHOL, HDL, LDLCALC, TRIG, CHOLHDL, LDLDIRECT in the last 72 hours. Thyroid Function Tests: No results for input(s): TSH, T4TOTAL, FREET4, T3FREE, THYROIDAB in the last 72 hours. Anemia Panel: No results for input(s): VITAMINB12, FOLATE, FERRITIN, TIBC, IRON, RETICCTPCT in the last 72 hours. Urine analysis:    Component Value Date/Time   COLORURINE YELLOW 03/26/2020 1705   APPEARANCEUR HAZY (A) 03/26/2020 1705   LABSPEC 1.019 03/26/2020  [place] 5.0 03/26/2020 1705   GLUCOSEU NEGATIVE 03/26/2020 1705   GLUCOSEU NEGATIVE 06/12/2014 1410   HGBUR SMALL (A) 03/26/2020 [place] 03/26/2020 1705   BILIRUBINUR Neg 06/19/2016 1101   KETONESUR NEGATIVE 03/26/2020 1705   PROTEINUR 30 (A) 03/26/2020 1705   UROBILINOGEN >=8.0 06/19/2016 1101   UROBILINOGEN 0.2 05/31/2015 1835   NITRITE NEGATIVE 03/26/2020 1705   LEUKOCYTESUR TRACE (A) 03/26/2020 1705   Sepsis Labs: @LABRCNTIP (procalcitonin:4,lacticidven:4)  ) Recent Results (from the past 240 hour(s))  Urine culture     Status: Abnormal   Collection Time: 03/26/20  5:05 PM   Specimen: Urine, Catheterized  Result Value Ref Range Status   Specimen Description URINE, CATHETERIZED  Final   Special Requests   Final    NONE Performed at [facility], [place] [address], [place], [place] [address]    Culture >=100,000 COLONIES/mL ENTEROCOCCUS FAECALIS (A)  Final   Report Status 03/28/2020 FINAL  Final   Organism ID, Bacteria ENTEROCOCCUS FAECALIS (A)  Final      Susceptibility   Enterococcus faecalis - MIC*    AMPICILLIN <=2 SENSITIVE Sensitive     NITROFURANTOIN <=16  SENSITIVE Sensitive     VANCOMYCIN 1 SENSITIVE Sensitive     * >=100,000 COLONIES/mL ENTEROCOCCUS FAECALIS  SARS Coronavirus 2 by RT PCR (hospital order, performed in [place] hospital lab) Nasopharyngeal Nasopharyngeal Swab     Status: None   Collection Time: 03/26/20  7:54 PM   Specimen: Nasopharyngeal Swab  Result Value Ref Range Status   SARS Coronavirus 2 NEGATIVE NEGATIVE Final    Comment: (NOTE) SARS-CoV-2 target nucleic acids are NOT DETECTED.  The SARS-CoV-2 RNA is generally detectable in upper and lower respiratory specimens during the acute phase of infection. The lowest concentration of SARS-CoV-2 viral copies this assay can detect is 250 copies / mL. A negative result does not preclude SARS-CoV-2 infection and should not be used as the sole basis for treatment or other patient management decisions.  A negative result may occur with improper specimen collection / handling, submission of specimen other than nasopharyngeal swab, presence of viral mutation(s) within the areas targeted by this assay, and inadequate number of viral copies (<250 copies / mL). A negative result must be combined with clinical observations, patient history, and epidemiological information.  Fact Sheet for Patients:   StrictlyIdeas.no  Fact Sheet for Healthcare Providers: BankingDealers.co.za  This test is not yet approved or  cleared by the [place] FDA and has been authorized for detection and/or diagnosis of SARS-CoV-2 by FDA under an Emergency Use Authorization (EUA).  This EUA will remain in effect (meaning this test can be used) for the duration of the COVID-19 declaration under Section 564(b)(1) of the Act, 21 U.S.C. section 360bbb-3(b)(1), unless the authorization is terminated or revoked sooner.  Performed at [facility], [place] [address], [place], [place] [address]   MRSA PCR Screening     Status: Abnormal   Collection  Time: 03/27/20 12:37 AM   Specimen: Nasal Mucosa; Nasopharyngeal  Result Value Ref Range Status   MRSA by PCR POSITIVE (A) NEGATIVE Final    Comment: CRITICAL RESULT CALLED TO, READ BACK BY AND VERIFIED WITH: RN Teodora Medici [id number] @0216  THANEY Performed at [facility] [address], [place], [place] [address]       Radiology Studies: DG Foot Complete Right  Result Date: 03/29/2020 CLINICAL DATA:  Altered mental status, right foot swelling EXAM: RIGHT  FOOT COMPLETE - 3+ VIEW COMPARISON:  None. FINDINGS: Normal alignment. No fracture or dislocation. Mild degenerative changes noted within the first MTP and midfoot. Moderate superior and plantar calcaneal spur. Vascular calcifications noted. IMPRESSION: No acute fracture or dislocation. Electronically Signed   By: Fidela Salisbury MD   On: 03/29/2020 18:27     Scheduled Meds: . atorvastatin  10 mg Oral QHS  . buPROPion  450 mg Oral Daily  . carvedilol  6.25 mg Oral BID WC  . Chlorhexidine Gluconate Cloth  6 each Topical Q0600  . docusate sodium  100 mg Oral Daily  . ezetimibe  10 mg Oral Daily  . gabapentin  300 mg Oral BID WC  . gabapentin  600 mg Oral QHS  . levothyroxine  88 mcg Oral QAC breakfast  . mupirocin ointment  1 application Nasal BID  . rivaroxaban  20 mg Oral Q supper  . sertraline  75 mg Oral Q1200   Continuous Infusions:    LOS: 3 days   Time Spent in minutes   30 minutes  Selda Jalbert D.O. on 03/30/2020 at 1:30 PM  Between 7am to 7pm - Please see pager noted on [email or web address]  After 7pm go to [email or web address]  And look for the night coverage person covering for me after hours  [facility] Office  [phone]

## 2020-03-30 NOTE — Progress Notes (Signed)
Called by RN that patient has elevated heart rate in the 110-120 range while lying in bed resting. Denies complaints of chest pain or pressure. EKG obtained which is A. fib with RVR with heart rate of 115. Given 10 mg IV Cardizem Check troponin every 2 hours x3 Continue anticoagulation with Xarelto To monitor closely

## 2020-03-30 NOTE — Progress Notes (Addendum)
Pt HR sustaining in 120s. Pt denies chest pain and SOB. Notified MD who recommended EKG. Paged MD regarding abnormal EKG. New orders for cardizem, troponins, and tele moitoring.    03/29/20 2348  Assess: MEWS Score  Temp 99 F (37.2 C)  BP 124/78  Pulse Rate (!) 125  Resp 20  Level of Consciousness Alert  SpO2 95 %  O2 Device Nasal Cannula  O2 Flow Rate (L/min) 3 L/min  Assess: MEWS Score  MEWS Temp 0  MEWS Systolic 0  MEWS Pulse 2  MEWS RR 0  MEWS LOC 0  MEWS Score 2  MEWS Score Color Yellow  Assess: if the MEWS score is Yellow or Red  Were vital signs taken at a resting state? Yes  Focused Assessment No change from prior assessment  Early Detection of Sepsis Score *See Row Information* Medium  MEWS guidelines implemented *See Row Information* Yes  Treat  MEWS Interventions Escalated (See documentation below);Administered scheduled meds/treatments;Other (Comment) (EKG)  Pain Scale 0-10  Pain Score 0  Take Vital Signs  Increase Vital Sign Frequency  Yellow: Q 2hr X 2 then Q 4hr X 2, if remains yellow, continue Q 4hrs  Escalate  MEWS: Escalate Yellow: discuss with charge nurse/RN and consider discussing with provider and RRT  Notify: Charge Nurse/RN  Name of Charge Nurse/RN Notified Phyl RN  Date Charge Nurse/RN Notified 03/30/20  Time Charge Nurse/RN Notified 0000  Notify: Provider  Provider Name/Title Chotiner MD  Date Provider Notified 03/29/20  Time Provider Notified 2358  Notification Type Page  Notification Reason Change in status (HR 125)  Response See new orders (EKG)  Date of Provider Response 03/29/20  Time of Provider Response 2359  Document  Progress note created (see row info) Yes

## 2020-03-30 NOTE — Progress Notes (Deleted)
Pharmacy Note Regarding Xarelto Renal Dose Adjustment   Pt currently taking xarelto 20mg  by mouth daily for afib with history of DVT/PE. Given current Scr 1.55 and CrCl calculation using total body weight 43ml/min, xarelto will be adjusted to 15mg  by mouth daily.   Will continue to monitor for improvements or changes in renal function and s/s of bleeding.    Wilson Singer, PharmD PGY1 Pharmacy Resident 03/30/2020 11:44 AM

## 2020-03-30 NOTE — TOC Progression Note (Deleted)
Transition of Care [facility]) - Progression Note    Patient Details  Name: Dawn Morrow MRN: [id number] Date of Birth: June 06, 1936  Transition of Care [facility]) CM/SW [place], [place] Phone Number: 03/30/2020, 11:27 AM  Clinical Narrative:    CSW contacted Durenda Age to inquire on patient returning to the facility. CSW left contact information for RN to call back with update.     Barriers to Discharge: Continued Medical Work up  Expected Discharge Plan and Services         Living arrangements for the past 2 months: [place]                                       Social Determinants of Health (SDOH) Interventions    Readmission Risk Interventions Readmission Risk Prevention Plan 10/03/2019  Transportation Screening Complete  PCP or Specialist Appt within 3-5 Days Complete  HRI or [place] Not Complete  HRI or Home Care Consult comments Patient at her baseline  Social Work Consult for [place] Planning/Counseling [place] Not Applicable  Medication Review Press photographer) Referral to Pharmacy  Some recent data might be hidden

## 2020-03-30 NOTE — NC FL2 (Signed)
[place] LEVEL OF CARE SCREENING TOOL     IDENTIFICATION  Patient Name: Dawn Morrow Birthdate: Mar 26, 1936 Sex: female Admission Date (Current Location): 03/26/2020  [facility] and [place] Number:  Herbalist and Address:  [facility] [place], [place] [address], [place], [place] [address]      Provider Number: [id number]  Attending Physician Name and Address:  Cristal Ford, DO  Relative Name and Phone Number:       Current Level of Care: Hospital Recommended Level of Care: [place] Prior Approval Number:    Date Approved/Denied:   PASRR Number: pending  Discharge Plan: Home    Current Diagnoses: Patient Active Problem List   Diagnosis Date Noted  . AMS (altered mental status) 03/26/2020  . History of pulmonary embolism 03/26/2020  . History of DVT (deep vein thrombosis) 03/26/2020  . Atrial fibrillation, chronic ([place]) 03/26/2020  . Sepsis ([place]) 12/14/2019  . Acute encephalopathy 12/14/2019  . COVID-19 virus infection 10/02/2019  . Acute lower UTI 10/02/2019  . Acute kidney failure ([place]) 10/01/2019  . Syncope 09/18/2017  . Near syncope 09/17/2017  . Diabetes mellitus type 2 in obese ([place]) 09/17/2017  . Atrial fibrillation with RVR ([place]) 09/17/2017  . Hypomagnesemia 09/17/2017  . CKD (chronic kidney disease), stage III 09/17/2017  . Forehead contusion   . MGUS (monoclonal gammopathy of unknown significance) 08/13/2016  . Renal failure 07/09/2015  . Follow-up ---------PCP NOTES 05/17/2015  . Essential tremor 12/28/2014  . Ulnar neuropathy of left upper extremity 12/28/2014  . Intractable pain 06/21/2014  . Back pain 06/21/2014  . Lumbar radiculopathy 06/21/2014  . Encounter for therapeutic drug monitoring 03/14/2014  . Numbness 10/18/2013  . TIA (transient ischemic attack) 09/19/2013  . Weakness 09/18/2013  . Annual physical exam 05/30/2012  . Tremor 01/21/2012  . Failure to thrive and poor med  compliance  01/21/2012  . PE (pulmonary embolism) 11/18/2010  . DVT (deep venous thrombosis) ([place]) 11/18/2010  . Long term current use of anticoagulant 11/18/2010  . ABNORMAL ELECTROCARDIOGRAM 11/10/2010  . OSTEOARTHRITIS 08/06/2010  . DIZZINESS 04/15/2010  . Diabetes ([place]) 06/04/2009  . ACNE ROSACEA 11/28/2008  . BACK PAIN 05/16/2008  . HIP PAIN, RIGHT, CHRONIC 09/15/2007  . Hypothyroidism 09/13/2007  . Dyslipidemia 09/13/2007  . Osteoporosis 07/22/2007  . DEPRESSION 09/11/2006  . HTN (hypertension) 09/11/2006  . ASTHMA 09/11/2006  . COPD (chronic obstructive pulmonary disease) ([place]) 09/11/2006  . GERD 09/11/2006  . Recurrent UTI 09/11/2006  . [place] FILTER INSERTION, HX OF 09/11/2006    Orientation RESPIRATION BLADDER Height & Weight     Situation, Place, Time, Self  O2 Incontinent Weight: (!) 209 lb (94.8 kg) Height:     BEHAVIORAL SYMPTOMS/MOOD NEUROLOGICAL BOWEL NUTRITION STATUS      Incontinent Diet (See discharge summary)  AMBULATORY STATUS COMMUNICATION OF NEEDS Skin   Extensive Assist Verbally Normal                       Personal Care Assistance Level of Assistance  Bathing, Feeding, Dressing Bathing Assistance: Maximum assistance Feeding assistance: Independent Dressing Assistance: Maximum assistance     Functional Limitations Info  Sight, Hearing, Speech Sight Info: Adequate Hearing Info: Adequate Speech Info: Adequate    SPECIAL CARE FACTORS FREQUENCY                       Contractures      Additional Factors Info  Code Status, Allergies  Code Status Info: Full Allergies Info: Penicillins Codeine Sulfonamide Derivatives           Current Medications (03/30/2020):  This is the current hospital active medication list Current Facility-Administered Medications  Medication Dose Route Frequency Provider Last Rate Last Admin  . albuterol (PROVENTIL) (2.5 MG/3ML) 0.083% nebulizer solution 2.5 mg  2.5 mg Inhalation Q6H PRN Cristal Ford, DO      . atorvastatin (LIPITOR) tablet 10 mg  10 mg Oral QHS Mikhail, [place], DO   10 mg at 03/29/20 2136  . buPROPion (WELLBUTRIN XL) 24 hr tablet 450 mg  450 mg Oral Daily Cristal Ford, DO   450 mg at 03/30/20 1024  . carvedilol (COREG) tablet 6.25 mg  6.25 mg Oral BID WC Mikhail, Maryann, DO   6.25 mg at 03/30/20 0900  . Chlorhexidine Gluconate Cloth 2 % PADS 6 each  6 each Topical Q0600 Tu, Ching T, DO   6 each at 03/30/20 0525  . docusate sodium (COLACE) capsule 100 mg  100 mg Oral Daily Cristal Ford, DO   100 mg at 03/30/20 1023  . ezetimibe (ZETIA) tablet 10 mg  10 mg Oral Daily Cristal Ford, DO   10 mg at 03/30/20 1023  . gabapentin (NEURONTIN) capsule 300 mg  300 mg Oral BID WC Mikhail, Maryann, DO   300 mg at 03/30/20 0900  . gabapentin (NEURONTIN) capsule 600 mg  600 mg Oral QHS Mikhail, [place], DO   600 mg at 03/29/20 2136  . levothyroxine (SYNTHROID) tablet 88 mcg  88 mcg Oral QAC breakfast Cristal Ford, DO   88 mcg at 03/30/20 0558  . LORazepam (ATIVAN) tablet 0.5 mg  0.5 mg Oral Q8H PRN Cristal Ford, DO      . meclizine (ANTIVERT) tablet 25 mg  25 mg Oral QHS PRN Cristal Ford, DO      . mupirocin ointment (BACTROBAN) 2 % 1 application  1 application Nasal BID Tu, Ching T, DO   1 application at 75/64/33 1033  . polyethylene glycol (MIRALAX / GLYCOLAX) packet 17 g  17 g Oral Daily PRN Cristal Ford, DO      . polyvinyl alcohol (LIQUIFILM TEARS) 1.4 % ophthalmic solution 1 drop  1 drop Both Eyes PRN Cristal Ford, DO      . rivaroxaban (XARELTO) tablet 20 mg  20 mg Oral Q supper Wilson Singer I, [facility]      . sertraline (ZOLOFT) tablet 75 mg  75 mg Oral Q1200 Cristal Ford, DO   75 mg at 03/29/20 2951     Discharge Medications: Please see discharge summary for a list of discharge medications.  Relevant Imaging Results:  Relevant Lab Results:   Additional Information SS#: [id number]  Emeterio Reeve, [place]

## 2020-03-31 DIAGNOSIS — R4182 Altered mental status, unspecified: Secondary | ICD-10-CM | POA: Diagnosis not present

## 2020-03-31 DIAGNOSIS — E1159 Type 2 diabetes mellitus with other circulatory complications: Secondary | ICD-10-CM | POA: Diagnosis not present

## 2020-03-31 DIAGNOSIS — J449 Chronic obstructive pulmonary disease, unspecified: Secondary | ICD-10-CM | POA: Diagnosis not present

## 2020-03-31 DIAGNOSIS — I482 Chronic atrial fibrillation, unspecified: Secondary | ICD-10-CM | POA: Diagnosis not present

## 2020-03-31 LAB — BASIC METABOLIC PANEL
Anion gap: 6 (ref 5–15)
BUN: 26 mg/dL — ABNORMAL HIGH (ref 8–23)
CO2: 30 mmol/L (ref 22–32)
Calcium: 8.8 mg/dL — ABNORMAL LOW (ref 8.9–10.3)
Chloride: 104 mmol/L (ref 98–111)
Creatinine, Ser: 1.37 mg/dL — ABNORMAL HIGH (ref 0.44–1.00)
GFR calc Af Amer: 42 mL/min — ABNORMAL LOW (ref >60)
GFR calc non Af Amer: 36 mL/min — ABNORMAL LOW (ref >60)
Glucose, Bld: 120 mg/dL — ABNORMAL HIGH (ref 70–99)
Potassium: 4.5 mmol/L (ref 3.5–5.1)
Sodium: 140 mmol/L (ref 135–145)

## 2020-03-31 LAB — CBC
HCT: 40.1 % (ref 36.0–46.0)
Hemoglobin: 12.4 g/dL (ref 12.0–15.0)
MCH: 29.9 pg (ref 26.0–34.0)
MCHC: 30.9 g/dL (ref 30.0–36.0)
MCV: 96.6 fL (ref 80.0–100.0)
Platelets: 193 10*3/uL (ref 150–400)
RBC: 4.15 MIL/uL (ref 3.87–5.11)
RDW: 17.4 % — ABNORMAL HIGH (ref 11.5–15.5)
WBC: 10.3 10*3/uL (ref 4.0–10.5)
nRBC: 0 % (ref 0.0–0.2)

## 2020-03-31 MED ORDER — DILTIAZEM HCL 25 MG/5ML IV SOLN
5.0000 mg | Freq: Once | INTRAVENOUS | Status: AC
  Administered 2020-03-31: 5 mg via INTRAVENOUS
  Filled 2020-03-31: qty 5

## 2020-03-31 NOTE — Progress Notes (Signed)
Physical Therapy Treatment Patient Details Name: Dawn Morrow MRN: [id number] DOB: 01/15/1936 Today's Date: 03/31/2020    History of Present Illness Pt is an 84 y/o female admitted secondary to acute metabolic encephalopathy; possibly from UTI. CT head is negative for acute abnormality. PMH includes DM, HTN, COPD, PE, DVT, and a fib.     PT Comments    Additional PT consult received for assessing need for rt ankle brace. Patient initially appeared to have ankle pain, however upon closer assessment her pain is actually in her knee. We completed several exercises to "loosen" her knee and then attempted OOB. Pt stood x 1 with RW from EOB, however sat back on bed after ~2-3 seconds and could not achieve standing on subsequent attempt. Patient was able to tolerate weight on RLE to come to stand. No need for bracing identified, however pt is very weak and will need SNF prior to hopeful return to ALF.    Follow Up Recommendations  SNF;Supervision/Assistance - 24 hour (ALF cannot take pt back at current level)     Equipment Recommendations  None recommended by PT    Recommendations for Other Services       Precautions / Restrictions Precautions Precautions: Fall    Mobility  Bed Mobility Overal bed mobility: Needs Assistance Bed Mobility: Sit to Sidelying;Sidelying to Sit;Rolling Rolling: Supervision Sidelying to sit: Min assist     Sit to sidelying: Min assist General bed mobility comments: Min A for trunk elevation to come to sitting. Increased time required. Min assist to raise legs up onto bed; vc for sequencing sit>side>back  Transfers Overall transfer level: Needs assistance Equipment used: Rolling walker (2 wheeled) Transfers: Sit to/from Stand Sit to Stand: Mod assist         General transfer comment: Mod A for lift and steady. Pt trying to use walker to stand and needed verbal cues for hand placement. Stood for ~3 seconds and quickly sat back down. "I was tired" Pt  also expresses fear of falling.   Ambulation/Gait             General Gait Details: unable   Stairs             Wheelchair Mobility    Modified Rankin (Stroke Patients Only)       Balance Overall balance assessment: Needs assistance Sitting-balance support: No upper extremity supported;Feet supported Sitting balance-Leahy Scale: Fair Sitting balance - Comments: close supervision   Standing balance support: Bilateral upper extremity supported;During functional activity Standing balance-Leahy Scale: Poor Standing balance comment: Reliant on BUE and external support.                             Cognition Arousal/Alertness: Awake/alert Behavior During Therapy: WFL for tasks assessed/performed Overall Cognitive Status: Impaired/Different from baseline Area of Impairment: Orientation;Attention;Following commands;Safety/judgement;Awareness;Problem solving                 Orientation Level: Disoriented to;Time (knew month and year just not day) Current Attention Level: Sustained   Following Commands: Follows one step commands with increased time Safety/Judgement: Decreased awareness of safety;Decreased awareness of deficits Awareness: Intellectual Problem Solving: Slow processing;Decreased initiation;Requires verbal cues;Requires tactile cues General Comments: Pt with slowed processing and decreased safety awareness (thinks she can get into recliner despite severe knee pain).       Exercises General Exercises - Lower Extremity Ankle Circles/Pumps: AROM;Both;10 reps Heel Slides: AAROM;Right;5 reps;Supine    General Comments General comments (  skin integrity, edema, etc.): Assessed rt foot and ankle per MD request. Noted pt flinches with pressure that translates to her knee (reports her knee is the source of pain and was able to tolerate full ankle PROM and palpation without pain).       Pertinent Vitals/Pain Pain Assessment: Faces Faces Pain  Scale: Hurts whole lot Pain Location: rt knee Pain Descriptors / Indicators: Grimacing;Guarding;Sharp Pain Intervention(s): Limited activity within patient's tolerance;Monitored during session;Other (comment) (gentle AAROM)    Home Living                      Prior Function            PT Goals (current goals can now be found in the care plan section) Acute Rehab PT Goals Patient Stated Goal: go for a walk Time For Goal Achievement: 04/10/20 Potential to Achieve Goals: Good Progress towards PT goals: Not progressing toward goals - comment (rt knee pain )    Frequency    Min 2X/week      PT Plan Current plan remains appropriate    Co-evaluation              AM-PAC PT "6 Clicks" Mobility   Outcome Measure  Help needed turning from your back to your side while in a flat bed without using bedrails?: A Little Help needed moving from lying on your back to sitting on the side of a flat bed without using bedrails?: A Little Help needed moving to and from a bed to a chair (including a wheelchair)?: Total Help needed standing up from a chair using your arms (e.g., wheelchair or bedside chair)?: A Lot Help needed to walk in hospital room?: Total Help needed climbing 3-5 steps with a railing? : Total 6 Click Score: 11    End of Session Equipment Utilized During Treatment: Gait belt Activity Tolerance: Patient tolerated treatment well Patient left: in bed;with call bell/phone within reach;with bed alarm set Nurse Communication: Mobility status;Other (comment) (does not need ankle brace) PT Visit Diagnosis: Unsteadiness on feet (R26.81);Muscle weakness (generalized) (M62.81)     Time: 8338-2505 PT Time Calculation (min) (ACUTE ONLY): 43 min  Charges:  $Therapeutic Exercise: 8-22 mins $Therapeutic Activity: 23-37 mins                      Arby Barrette, PT Pager [phone]    Rexanne Mano 03/31/2020, 3:53 PM

## 2020-03-31 NOTE — Progress Notes (Signed)
PROGRESS NOTE    Dawn Morrow  TMH:[id number] DOB: 11-May-1936 DOA: 03/26/2020 PCP: Colon Branch, MD   Brief Narrative:  HPI on 03/26/2020 by Dr. Ileene Musa Dawn Morrow is a 84 y.o. female with medical history significant for COPD with chronic hypoxemia on 3 L, atrial fibrillation, PE/DVT on Xarelto, hypertension, CKD stage 3a, type 2 diabetes, hypothyroidism, MGUS who presents with concerns of altered mental status.  Patient unable to provide any history as to why she is here in the ED.  Per ED documentation, nursing staff at her SNF thought that she was different from her baseline.  Needing more assistance with transfer.  She was last admitted to the hospital in April with altered mental status due to E. coli UTI. Assessment & Plan   Acute metabolic encephalopathy secondary to UTI -Back to baseline- currently AAOx3 -CT head unremarkable -Chest x-rayshowing cardiomegaly, but no infection -UA showing trace leukocytes, negative nitrites, few bacteria -Of note, patient was hospitalized recently for E. coli UTI -Urine culture >100k  Enterococcus faecalis- only sensitive to ampillicin, nitrofurantoin, vanc- however patient has a PCN allergy -Patient was placed on vancomycin for 3 days -TSH 5.32, possibly reactive  Acute the injury on chronic kidney disease, stage IIIa -Creatinine 1.8 on admission, baseline is approximately 1.1 -Given IV fluids -Creatinine 1.37 -Continue to monitor BMP  Recurrent PE/DVT/atrial fibrillation -Continue Xarelto -overnight, patient noted to have heart rate in the 110-120 range and was given 10 mg of IV Cardizem -Troponins obtained and were unremarkable  Leukocytosis -WBC were up to 13, however down to 10.3 today -Patient completed 3 days of vancomycin -blood cultures show no growth to date -patient also noted to have mild erythema of the right foot- Xray obtained and unremarkable -Chest x-ray shows worsening hazy airspace density over the left  base/retrocardiac region may be atelectasis or infection -Patient with no cough on exam or shortness of breath -We will order incentive spirometry -continue to monitor CBC  Essential hypertension -BP currently stable  Hypothyroidism -TSH is elevated however this may be reactive -Would not change Synthroid dose at this time given possible infection -Should have repeat thyroid function tests as an outpatient  COPD with chronic respiratory hypoxic failure -Continue baseline of 3 L of nasal cannula  Diabetes mellitus, type II -Hemoglobin A1c was 6.1 in April 2021 -Appears to be controlled -Monitor CBGs, continue insulin sliding scale  GERD -continue PPI  Right foot-possible sprain -X-ray was unremarkable -Patient with slight edema as well as bruising noted only top and lateral aspect of her foot -She denies rolling her ankle -Will order PT for possible brace -Will order ice  DVT Prophylaxis  Xarelto  Code Status: Full  Family Communication: None at bedside  Disposition Plan:  Status is: Inpatient  Remains inpatient appropriate because:IV treatments appropriate due to intensity of illness or inability to take PO   Dispo: The patient is from: ALF              Anticipated d/c is to: SNF              Anticipated d/c date is: 2 days              Patient currently is not medically stable to d/c.   Consultants None  Procedures  None  Antibiotics   Anti-infectives (From admission, onward)   Start     Dose/Rate Route Frequency Ordered Stop   03/28/20 1100  vancomycin (VANCOCIN) IVPB 1000 mg/200 mL premix  1,000 mg 200 mL/hr over 60 Minutes Intravenous Every 24 hours 03/28/20 0952 03/30/20 1132   03/26/20 2200  aztreonam (AZACTAM) 2 g in sodium chloride 0.9 % 100 mL IVPB  Status:  Discontinued        2 g 200 mL/hr over 30 Minutes Intravenous Every 8 hours 03/26/20 1953 03/28/20 0948      Subjective:   Dawn Morrow seen and examined today.  Patient with no  complaints this morning.  Feels her right foot soreness is improving.  Denies current chest pain or shortness of breath, abdominal pain, nausea or vomiting, diarrhea constipation, dizziness or headache.   Objective:   Vitals:   03/30/20 2003 03/31/20 0017 03/31/20 0350 03/31/20 0840  BP: (!) 135/102 (!) 135/79 (!) 137/73 (!) 155/74  Pulse: 103 70 77 73  Resp: 18 17 16 18   Temp: 98.2 F (36.8 C) 97.9 F (36.6 C) 97.9 F (36.6 C) 98 F (36.7 C)  TempSrc: Oral  Oral Oral  SpO2: 96% 99% 98% (!) 77%  Weight:        Intake/Output Summary (Last 24 hours) at 03/31/2020 1109 Last data filed at 03/31/2020 0017 Gross per 24 hour  Intake 69.55 ml  Output 250 ml  Net -180.45 ml   Filed Weights   03/29/20 1635  Weight: (!) 94.8 kg    Exam  General: Well developed, well nourished, NAD, appears stated age  32: NCAT,mucous membranes moist.   Cardiovascular: S1 S2 auscultated, irregular  Respiratory: Clear to auscultation bilaterally, no wheezing  Abdomen: Soft, nontender, nondistended, + bowel sounds  Extremities: warm dry without cyanosis clubbing of LLE. Right pedal edema on the lateral aspect with bruising  Neuro: AAOx3, nonfocal  Psych: Pleasant, appropriate mood and affect  Data Reviewed: I have personally reviewed following labs and imaging studies  CBC: Recent Labs  Lab 03/26/20 1414 03/26/20 1414 03/27/20 0427 03/28/20 0526 03/29/20 1241 03/30/20 0212 03/31/20 0418  WBC 12.7*   < > 13.6* 10.9* 11.0* 13.0* 10.3  NEUTROABS 10.0*  --   --   --   --   --   --   HGB 12.7   < > 13.4 13.4 12.2 12.1 12.4  HCT 42.2   < > 43.1 42.8 39.7 39.8 40.1  MCV 96.3   < > 95.8 95.5 94.5 94.8 96.6  PLT 162   < > 163 170 177 181 193   < > = values in this interval not displayed.   Basic Metabolic Panel: Recent Labs  Lab 03/27/20 0427 03/28/20 0526 03/29/20 1241 03/30/20 0212 03/31/20 0418  NA 140 140 137 139 140  K 4.0 4.0 4.3 4.4 4.5  CL 101 102 102 101 104  CO2 30  29 26 28 30   GLUCOSE 108* 129* 186* 129* 120*  BUN 21 18 21  26* 26*  CREATININE 1.51* 1.35* 1.31* 1.55* 1.37*  CALCIUM 8.7* 8.8* 8.6* 8.7* 8.8*   GFR: Estimated Creatinine Clearance: 35.3 mL/min (A) (by C-G formula based on SCr of 1.37 mg/dL (H)). Liver Function Tests: Recent Labs  Lab 03/26/20 1414  AST 19  ALT 15  ALKPHOS 75  BILITOT 1.1  PROT 7.2  ALBUMIN 3.2*   No results for input(s): LIPASE, AMYLASE in the last 168 hours. No results for input(s): AMMONIA in the last 168 hours. Coagulation Profile: Recent Labs  Lab 03/26/20 1518  INR 1.3*   Cardiac Enzymes: No results for input(s): CKTOTAL, CKMB, CKMBINDEX, TROPONINI in the last 168 hours. BNP (last 3 results)  No results for input(s): PROBNP in the last 8760 hours. HbA1C: No results for input(s): HGBA1C in the last 72 hours. CBG: Recent Labs  Lab 03/26/20 1424  GLUCAP 109*   Lipid Profile: No results for input(s): CHOL, HDL, LDLCALC, TRIG, CHOLHDL, LDLDIRECT in the last 72 hours. Thyroid Function Tests: No results for input(s): TSH, T4TOTAL, FREET4, T3FREE, THYROIDAB in the last 72 hours. Anemia Panel: No results for input(s): VITAMINB12, FOLATE, FERRITIN, TIBC, IRON, RETICCTPCT in the last 72 hours. Urine analysis:    Component Value Date/Time   COLORURINE YELLOW 03/26/2020 1705   APPEARANCEUR HAZY (A) 03/26/2020 1705   LABSPEC 1.019 03/26/2020 1705   PHURINE 5.0 03/26/2020 1705   GLUCOSEU NEGATIVE 03/26/2020 1705   GLUCOSEU NEGATIVE 06/12/2014 1410   HGBUR SMALL (A) 03/26/2020 1705   BILIRUBINUR NEGATIVE 03/26/2020 1705   BILIRUBINUR Neg 06/19/2016 1101   KETONESUR NEGATIVE 03/26/2020 1705   PROTEINUR 30 (A) 03/26/2020 1705   UROBILINOGEN >=8.0 06/19/2016 1101   UROBILINOGEN 0.2 05/31/2015 1835   NITRITE NEGATIVE 03/26/2020 1705   LEUKOCYTESUR TRACE (A) 03/26/2020 1705   Sepsis Labs: @LABRCNTIP (procalcitonin:4,lacticidven:4)  ) Recent Results (from the past 240 hour(s))  Urine culture      Status: Abnormal   Collection Time: 03/26/20  5:05 PM   Specimen: Urine, Catheterized  Result Value Ref Range Status   Specimen Description URINE, CATHETERIZED  Final   Special Requests   Final    NONE Performed at [facility], [place] [address] [place], [place] [address]    Culture >=100,000 COLONIES/mL ENTEROCOCCUS FAECALIS (A)  Final   Report Status 03/28/2020 FINAL  Final   Organism ID, Bacteria ENTEROCOCCUS FAECALIS (A)  Final      Susceptibility   Enterococcus faecalis - MIC*    AMPICILLIN <=2 SENSITIVE Sensitive     NITROFURANTOIN <=16 SENSITIVE Sensitive     VANCOMYCIN 1 SENSITIVE Sensitive     * >=100,000 COLONIES/mL ENTEROCOCCUS FAECALIS  SARS Coronavirus 2 by RT PCR (hospital order, performed in [place] hospital lab) Nasopharyngeal Nasopharyngeal Swab     Status: None   Collection Time: 03/26/20  7:54 PM   Specimen: Nasopharyngeal Swab  Result Value Ref Range Status   SARS Coronavirus 2 NEGATIVE NEGATIVE Final    Comment: (NOTE) SARS-CoV-2 target nucleic acids are NOT DETECTED.  The SARS-CoV-2 RNA is generally detectable in upper and lower respiratory specimens during the acute phase of infection. The lowest concentration of SARS-CoV-2 viral copies this assay can detect is 250 copies / mL. A negative result does not preclude SARS-CoV-2 infection and should not be used as the sole basis for treatment or other patient management decisions.  A negative result may occur with improper specimen collection / handling, submission of specimen other than nasopharyngeal swab, presence of viral mutation(s) within the areas targeted by this assay, and inadequate number of viral copies (<250 copies / mL). A negative result must be combined with clinical observations, patient history, and epidemiological information.  Fact Sheet for Patients:   StrictlyIdeas.no  Fact Sheet for Healthcare  Providers: BankingDealers.co.za  This test is not yet approved or  cleared by the [place] FDA and has been authorized for detection and/or diagnosis of SARS-CoV-2 by FDA under an Emergency Use Authorization (EUA).  This EUA will remain in effect (meaning this test can be used) for the duration of the COVID-19 declaration under Section 564(b)(1) of the Act, 21 U.S.C. section 360bbb-3(b)(1), unless the authorization is terminated or revoked sooner.  Performed at  [facility], [place] [address], [place], [place] [address]   MRSA PCR Screening     Status: Abnormal   Collection Time: 03/27/20 12:37 AM   Specimen: Nasal Mucosa; Nasopharyngeal  Result Value Ref Range Status   MRSA by PCR POSITIVE (A) NEGATIVE Final    Comment: CRITICAL RESULT CALLED TO, READ BACK BY AND VERIFIED WITH: RN Teodora Medici [id number] @0216  THANEY Performed at [facility] [address] [place], [place] [address]   Culture, blood (routine x 2)     Status: None (Preliminary result)   Collection Time: 03/30/20 12:28 PM   Specimen: BLOOD  Result Value Ref Range Status   Specimen Description BLOOD RIGHT ANTECUBITAL  Final   Special Requests   Final    BOTTLES DRAWN AEROBIC AND ANAEROBIC Blood Culture results may not be optimal due to an inadequate volume of blood received in culture bottles   Culture   Final    NO GROWTH < 24 HOURS Performed at [facility], [place] [address], [place], [place] [address]    Report Status PENDING  Incomplete  Culture, blood (routine x 2)     Status: None (Preliminary result)   Collection Time: 03/30/20 12:33 PM   Specimen: BLOOD  Result Value Ref Range Status   Specimen Description BLOOD RIGHT ANTECUBITAL  Final   Special Requests   Final    BOTTLES DRAWN AEROBIC ONLY Blood Culture adequate volume   Culture   Final    NO GROWTH < 24 HOURS Performed at [facility], [place] [address] [place], [place] [address]    Report Status  PENDING  Incomplete      Radiology Studies: DG CHEST PORT 1 VIEW  Result Date: 03/30/2020 CLINICAL DATA:  Evaluate cardiomegaly and right base atelectasis. EXAM: PORTABLE CHEST 1 VIEW COMPARISON:  03/26/2020 FINDINGS: Lungs are adequately inflated with worsening left base/retrocardiac opacification which may be due to atelectasis or infection. No effusion. Stable cardiomegaly. Remainder of the exam is unchanged. IMPRESSION: 1. Worsening hazy airspace density over the left base/retrocardiac region which may be due to atelectasis or infection. 2.  Stable cardiomegaly. Electronically Signed   By: Marin Olp M.D.   On: 03/30/2020 15:11   DG Foot Complete Right  Result Date: 03/29/2020 CLINICAL DATA:  Altered mental status, right foot swelling EXAM: RIGHT FOOT COMPLETE - 3+ VIEW COMPARISON:  None. FINDINGS: Normal alignment. No fracture or dislocation. Mild degenerative changes noted within the first MTP and midfoot. Moderate superior and plantar calcaneal spur. Vascular calcifications noted. IMPRESSION: No acute fracture or dislocation. Electronically Signed   By: Fidela Salisbury MD   On: 03/29/2020 18:27     Scheduled Meds: . atorvastatin  10 mg Oral QHS  . buPROPion  450 mg Oral Daily  . carvedilol  6.25 mg Oral BID WC  . Chlorhexidine Gluconate Cloth  6 each Topical Q0600  . docusate sodium  100 mg Oral Daily  . ezetimibe  10 mg Oral Daily  . gabapentin  300 mg Oral BID WC  . gabapentin  600 mg Oral QHS  . levothyroxine  88 mcg Oral QAC breakfast  . rivaroxaban  20 mg Oral Q supper  . sertraline  75 mg Oral Q1200   Continuous Infusions: . sodium chloride 50 mL/hr at 03/30/20 1528     LOS: 4 days   Time Spent in minutes   30 minutes  Shaye Elling D.O. on 03/31/2020 at 11:09 AM  Between 7am  to 7pm - Please see pager noted on [email or web address]  After 7pm go to [email or web address]  And look for the night coverage person covering for me after hours  [facility] Office   [phone]

## 2020-03-31 NOTE — Progress Notes (Signed)
MD notified of pt HR increasing in the 130' and sustaining. Pt assessed and is asymptomatic. MD gave orders for 5mg  Cardizem IV once.

## 2020-04-01 DIAGNOSIS — I482 Chronic atrial fibrillation, unspecified: Secondary | ICD-10-CM | POA: Diagnosis not present

## 2020-04-01 DIAGNOSIS — E1159 Type 2 diabetes mellitus with other circulatory complications: Secondary | ICD-10-CM | POA: Diagnosis not present

## 2020-04-01 DIAGNOSIS — J449 Chronic obstructive pulmonary disease, unspecified: Secondary | ICD-10-CM | POA: Diagnosis not present

## 2020-04-01 DIAGNOSIS — R4182 Altered mental status, unspecified: Secondary | ICD-10-CM | POA: Diagnosis not present

## 2020-04-01 MED ORDER — ACETAMINOPHEN 325 MG PO TABS
650.0000 mg | ORAL_TABLET | Freq: Once | ORAL | Status: AC
  Administered 2020-04-01: 650 mg via ORAL
  Filled 2020-04-01: qty 2

## 2020-04-01 NOTE — Discharge Instructions (Signed)
Information on my medicine - XARELTO (Rivaroxaban)  This medication education was reviewed with me or my healthcare representative as part of my discharge preparation.    Why was Xarelto prescribed for you? Xarelto was prescribed for you to reduce the risk of a blood clot forming.  What do you need to know about xarelto ? Take your Xarelto ONCE DAILY at the same time every day with your evening meal. If you have difficulty swallowing the tablet whole, you may crush it and mix in applesauce just prior to taking your dose.  Take Xarelto exactly as prescribed by your doctor and DO NOT stop taking Xarelto without talking to the doctor who prescribed the medication.  Stopping without other stroke prevention medication to take the place of Xarelto may increase your risk of developing a clot..  Refill your prescription before you run out.  After discharge, you should have regular check-up appointments with your healthcare provider that is prescribing your Xarelto.  In the future your dose may need to be changed if your kidney function or weight changes by a significant amount.  What do you do if you miss a dose? If you are taking Xarelto ONCE DAILY and you miss a dose, take it as soon as you remember on the same day then continue your regularly scheduled once daily regimen the next day. Do not take two doses of Xarelto at the same time or on the same day.   Important Safety Information A possible side effect of Xarelto is bleeding. You should call your healthcare provider right away if you experience any of the following: ? Bleeding from an injury or your nose that does not stop. ? Unusual colored urine (red or dark brown) or unusual colored stools (red or black). ? Unusual bruising for unknown reasons. ? A serious fall or if you hit your head (even if there is no bleeding).  Some medicines may interact with Xarelto and might increase your risk of bleeding while on Xarelto. To help  avoid this, consult your healthcare provider or pharmacist prior to using any new prescription or non-prescription medications, including herbals, vitamins, non-steroidal anti-inflammatory drugs (NSAIDs) and supplements.  This website has more information on Xarelto: [email or web address]-benson.[email or web address]

## 2020-04-01 NOTE — TOC Progression Note (Signed)
Transition of Care [facility]) - Progression Note    Patient Details  Name: Dawn Morrow MRN: [id number] Date of Birth: 05/03/1936  Transition of Care [facility]) CM/SW Contact  Keonta Monceaux, Francetta Found, LCSW Phone Number: 04/01/2020, 4:36 PM  Clinical Narrative:    CSW left a message with patient niece regarding available bed offers.  Will provide upon return call and facilitate patient discharge needs once medically stable.     Barriers to Discharge: Continued Medical Work up  Expected Discharge Plan and Services         Living arrangements for the past 2 months: [place]                                       Social Determinants of Health (SDOH) Interventions    Readmission Risk Interventions Readmission Risk Prevention Plan 10/03/2019  Transportation Screening Complete  PCP or Specialist Appt within 3-5 Days Complete  HRI or [place] Not Complete  HRI or Home Care Consult comments Patient at her baseline  Social Work Consult for [place] Planning/Counseling [place] Not Applicable  Medication Review [employer]) Referral to Pharmacy  Some recent data might be hidden

## 2020-04-01 NOTE — Progress Notes (Signed)
PROGRESS NOTE    Dawn Morrow  WGY:[id number] DOB: August 12, 1936 DOA: 03/26/2020 PCP: Colon Branch, MD   Brief Narrative:  HPI on 03/26/2020 by Dr. Ileene Musa Dawn Morrow is a 84 y.o. female with medical history significant for COPD with chronic hypoxemia on 3 L, atrial fibrillation, PE/DVT on Xarelto, hypertension, CKD stage 3a, type 2 diabetes, hypothyroidism, MGUS who presents with concerns of altered mental status.  Patient unable to provide any history as to why she is here in the ED.  Per ED documentation, nursing staff at her SNF thought that she was different from her baseline.  Needing more assistance with transfer.  She was last admitted to the hospital in April with altered mental status due to E. coli UTI. Assessment & Plan   Acute metabolic encephalopathy secondary to UTI -Back to baseline- currently AAOx3 -CT head unremarkable -Chest x-rayshowing cardiomegaly, but no infection -UA showing trace leukocytes, negative nitrites, few bacteria -Of note, patient was hospitalized recently for E. coli UTI -Urine culture >100k  Enterococcus faecalis- only sensitive to ampillicin, nitrofurantoin, vanc- however patient has a PCN allergy -Patient was placed on vancomycin for 3 days -TSH 5.32, possibly reactive  Acute the injury on chronic kidney disease, stage IIIa -Creatinine 1.8 on admission, baseline is approximately 1.1 -Given IV fluids -Creatinine 1.37 -Continue to monitor BMP  Recurrent PE/DVT/ Atrial fibrillation with intermittent RVR -Continue Xarelto -patient with intermittent HR up to 130s, but asymptomatic- given cardizem 5mg - HR currently controlled  -Continue coreg -Troponins obtained and were unremarkable  Leukocytosis -WBC were up to 13, however down to 10.3 today -Patient completed 3 days of vancomycin -blood cultures show no growth to date -patient also noted to have mild erythema of the right foot- Xray obtained and unremarkable -Chest x-ray shows worsening hazy  airspace density over the left base/retrocardiac region may be atelectasis or infection -Patient with no cough on exam or shortness of breath -Will order incentive spirometry -continue to monitor CBC  Essential hypertension -BP currently stable, continue coreg  Hypothyroidism -TSH is elevated however this may be reactive -Would not change Synthroid dose at this time given possible infection -Should have repeat thyroid function tests as an outpatient  COPD with chronic respiratory hypoxic failure -Continue baseline of 3 L of nasal cannula  Diabetes mellitus, type II -Hemoglobin A1c was 6.1 in April 2021 -Appears to be controlled -Monitor CBGs, continue insulin sliding scale  GERD -continue PPI  Right foot-possible sprain -X-ray was unremarkable -Patient with slight edema as well as bruising noted only top and lateral aspect of her foot -She denies rolling her ankle -PT consulted -patient feels her foot is less sore, but has had R knee issues -Continue ice  DVT Prophylaxis  Xarelto  Code Status: Full  Family Communication: None at bedside  Disposition Plan:  Status is: Inpatient  Remains inpatient appropriate because:IV treatments appropriate due to intensity of illness or inability to take PO   Dispo: The patient is from: ALF              Anticipated d/c is to: SNF              Anticipated d/c date is: 2 days              Patient currently is not medically stable to d/c.   Consultants None  Procedures  None  Antibiotics   Anti-infectives (From admission, onward)   Start     Dose/Rate Route Frequency Ordered Stop  03/28/20 1100  vancomycin (VANCOCIN) IVPB 1000 mg/200 mL premix        1,000 mg 200 mL/hr over 60 Minutes Intravenous Every 24 hours 03/28/20 0952 03/30/20 1132   03/26/20 2200  aztreonam (AZACTAM) 2 g in sodium chloride 0.9 % 100 mL IVPB  Status:  Discontinued        2 g 200 mL/hr over 30 Minutes Intravenous Every 8 hours 03/26/20 1953 03/28/20  1287      Subjective:   Dawn Morrow seen and examined today.  Patient feels her right foot pain is improving, complains of right knee pain and swelling from time to time.  Denies any recent fall or trauma.  Currently denies chest pain or shortness of breath, abdominal pain, nausea or vomiting, dizziness or headache.   Objective:   Vitals:   03/31/20 2343 04/01/20 0343 04/01/20 0735 04/01/20 1224  BP: 119/80 (!) 143/94 (!) 138/93 (!) 138/78  Pulse: 69 75 78 77  Resp: 20 20 18 18   Temp: 98.7 F (37.1 C) 98 F (36.7 C) 98 F (36.7 C) 97.6 F (36.4 C)  TempSrc: Oral Oral Oral Oral  SpO2:  96% 94% 94%  Weight:        Intake/Output Summary (Last 24 hours) at 04/01/2020 1325 Last data filed at 04/01/2020 0800 Gross per 24 hour  Intake 2304.62 ml  Output 900 ml  Net 1404.62 ml   Filed Weights   03/29/20 1635  Weight: (!) 94.8 kg   Exam  General: Well developed, well nourished, NAD, appears stated age  HEENT: NCAT, mucous membranes moist.   Cardiovascular: S1 S2 auscultated, irregular.  Respiratory: Clear to auscultation bilaterally  Abdomen: Soft, nontender, nondistended, + bowel sounds  Extremities: warm dry without cyanosis clubbing or edema of LLE.  Mild right pedal edema on the lateral aspect with bruising, improving.  Mild swelling of R knee  Neuro: AAOx3, nonfocal  Psych: Appropriate mood and affect  Data Reviewed: I have personally reviewed following labs and imaging studies  CBC: Recent Labs  Lab 03/26/20 1414 03/26/20 1414 03/27/20 0427 03/28/20 0526 03/29/20 1241 03/30/20 0212 03/31/20 0418  WBC 12.7*   < > 13.6* 10.9* 11.0* 13.0* 10.3  NEUTROABS 10.0*  --   --   --   --   --   --   HGB 12.7   < > 13.4 13.4 12.2 12.1 12.4  HCT 42.2   < > 43.1 42.8 39.7 39.8 40.1  MCV 96.3   < > 95.8 95.5 94.5 94.8 96.6  PLT 162   < > 163 170 177 181 193   < > = values in this interval not displayed.   Basic Metabolic Panel: Recent Labs  Lab 03/27/20 0427  03/28/20 0526 03/29/20 1241 03/30/20 0212 03/31/20 0418  NA 140 140 137 139 140  K 4.0 4.0 4.3 4.4 4.5  CL 101 102 102 101 104  CO2 30 29 26 28 30   GLUCOSE 108* 129* 186* 129* 120*  BUN 21 18 21  26* 26*  CREATININE 1.51* 1.35* 1.31* 1.55* 1.37*  CALCIUM 8.7* 8.8* 8.6* 8.7* 8.8*   GFR: Estimated Creatinine Clearance: 35.3 mL/min (A) (by C-G formula based on SCr of 1.37 mg/dL (H)). Liver Function Tests: Recent Labs  Lab 03/26/20 1414  AST 19  ALT 15  ALKPHOS 75  BILITOT 1.1  PROT 7.2  ALBUMIN 3.2*   No results for input(s): LIPASE, AMYLASE in the last 168 hours. No results for input(s): AMMONIA in the last 168  hours. Coagulation Profile: Recent Labs  Lab 03/26/20 1518  INR 1.3*   Cardiac Enzymes: No results for input(s): CKTOTAL, CKMB, CKMBINDEX, TROPONINI in the last 168 hours. BNP (last 3 results) No results for input(s): PROBNP in the last 8760 hours. HbA1C: No results for input(s): HGBA1C in the last 72 hours. CBG: Recent Labs  Lab 03/26/20 1424  GLUCAP 109*   Lipid Profile: No results for input(s): CHOL, HDL, LDLCALC, TRIG, CHOLHDL, LDLDIRECT in the last 72 hours. Thyroid Function Tests: No results for input(s): TSH, T4TOTAL, FREET4, T3FREE, THYROIDAB in the last 72 hours. Anemia Panel: No results for input(s): VITAMINB12, FOLATE, FERRITIN, TIBC, IRON, RETICCTPCT in the last 72 hours. Urine analysis:    Component Value Date/Time   COLORURINE YELLOW 03/26/2020 1705   APPEARANCEUR HAZY (A) 03/26/2020 1705   LABSPEC 1.019 03/26/2020 1705   PHURINE 5.0 03/26/2020 1705   GLUCOSEU NEGATIVE 03/26/2020 1705   GLUCOSEU NEGATIVE 06/12/2014 1410   HGBUR SMALL (A) 03/26/2020 1705   BILIRUBINUR NEGATIVE 03/26/2020 1705   BILIRUBINUR Neg 06/19/2016 1101   KETONESUR NEGATIVE 03/26/2020 1705   PROTEINUR 30 (A) 03/26/2020 1705   UROBILINOGEN >=8.0 06/19/2016 1101   UROBILINOGEN 0.2 05/31/2015 1835   NITRITE NEGATIVE 03/26/2020 1705   LEUKOCYTESUR TRACE (A)  03/26/2020 1705   Sepsis Labs: @LABRCNTIP (procalcitonin:4,lacticidven:4)  ) Recent Results (from the past 240 hour(s))  Urine culture     Status: Abnormal   Collection Time: 03/26/20  5:05 PM   Specimen: Urine, Catheterized  Result Value Ref Range Status   Specimen Description URINE, CATHETERIZED  Final   Special Requests   Final    NONE Performed at [facility], [place] [address] [place], [place] [address]    Culture >=100,000 COLONIES/mL ENTEROCOCCUS FAECALIS (A)  Final   Report Status 03/28/2020 FINAL  Final   Organism ID, Bacteria ENTEROCOCCUS FAECALIS (A)  Final      Susceptibility   Enterococcus faecalis - MIC*    AMPICILLIN <=2 SENSITIVE Sensitive     NITROFURANTOIN <=16 SENSITIVE Sensitive     VANCOMYCIN 1 SENSITIVE Sensitive     * >=100,000 COLONIES/mL ENTEROCOCCUS FAECALIS  SARS Coronavirus 2 by RT PCR (hospital order, performed in [place] hospital lab) Nasopharyngeal Nasopharyngeal Swab     Status: None   Collection Time: 03/26/20  7:54 PM   Specimen: Nasopharyngeal Swab  Result Value Ref Range Status   SARS Coronavirus 2 NEGATIVE NEGATIVE Final    Comment: (NOTE) SARS-CoV-2 target nucleic acids are NOT DETECTED.  The SARS-CoV-2 RNA is generally detectable in upper and lower respiratory specimens during the acute phase of infection. The lowest concentration of SARS-CoV-2 viral copies this assay can detect is 250 copies / mL. A negative result does not preclude SARS-CoV-2 infection and should not be used as the sole basis for treatment or other patient management decisions.  A negative result may occur with improper specimen collection / handling, submission of specimen other than nasopharyngeal swab, presence of viral mutation(s) within the areas targeted by this assay, and inadequate number of viral copies (<250 copies / mL). A negative result must be combined with clinical observations, patient history, and epidemiological information.  Fact Sheet  for Patients:   StrictlyIdeas.no  Fact Sheet for Healthcare Providers: BankingDealers.co.za  This test is not yet approved or  cleared by the [place] FDA and has been authorized for detection and/or diagnosis of SARS-CoV-2 by FDA under an Emergency Use Authorization (EUA).  This EUA will remain in effect (meaning  this test can be used) for the duration of the COVID-19 declaration under Section 564(b)(1) of the Act, 21 U.S.C. section 360bbb-3(b)(1), unless the authorization is terminated or revoked sooner.  Performed at [facility], [place] [address], [place], [place] [address]   MRSA PCR Screening     Status: Abnormal   Collection Time: 03/27/20 12:37 AM   Specimen: Nasal Mucosa; Nasopharyngeal  Result Value Ref Range Status   MRSA by PCR POSITIVE (A) NEGATIVE Final    Comment: CRITICAL RESULT CALLED TO, READ BACK BY AND VERIFIED WITH: RN Teodora Medici [id number] @0216  THANEY Performed at [facility] [address] [place], [place] [address]   Culture, blood (routine x 2)     Status: None (Preliminary result)   Collection Time: 03/30/20 12:28 PM   Specimen: BLOOD  Result Value Ref Range Status   Specimen Description BLOOD RIGHT ANTECUBITAL  Final   Special Requests   Final    BOTTLES DRAWN AEROBIC AND ANAEROBIC Blood Culture results may not be optimal due to an inadequate volume of blood received in culture bottles   Culture   Final    NO GROWTH 2 DAYS Performed at [facility], [place] [address], [place], [place] [address]    Report Status PENDING  Incomplete  Culture, blood (routine x 2)     Status: None (Preliminary result)   Collection Time: 03/30/20 12:33 PM   Specimen: BLOOD  Result Value Ref Range Status   Specimen Description BLOOD RIGHT ANTECUBITAL  Final   Special Requests   Final    BOTTLES DRAWN AEROBIC ONLY Blood Culture adequate volume   Culture   Final    NO GROWTH 2 DAYS Performed at  [facility], [place] [address] [place], [place] [address]    Report Status PENDING  Incomplete      Radiology Studies: DG CHEST PORT 1 VIEW  Result Date: 03/30/2020 CLINICAL DATA:  Evaluate cardiomegaly and right base atelectasis. EXAM: PORTABLE CHEST 1 VIEW COMPARISON:  03/26/2020 FINDINGS: Lungs are adequately inflated with worsening left base/retrocardiac opacification which may be due to atelectasis or infection. No effusion. Stable cardiomegaly. Remainder of the exam is unchanged. IMPRESSION: 1. Worsening hazy airspace density over the left base/retrocardiac region which may be due to atelectasis or infection. 2.  Stable cardiomegaly. Electronically Signed   By: Marin Olp M.D.   On: 03/30/2020 15:11     Scheduled Meds:  atorvastatin  10 mg Oral QHS   buPROPion  450 mg Oral Daily   carvedilol  6.25 mg Oral BID WC   docusate sodium  100 mg Oral Daily   ezetimibe  10 mg Oral Daily   gabapentin  300 mg Oral BID WC   gabapentin  600 mg Oral QHS   levothyroxine  88 mcg Oral QAC breakfast   rivaroxaban  20 mg Oral Q supper   sertraline  75 mg Oral Q1200   Continuous Infusions:  sodium chloride 50 mL/hr at 03/30/20 1528     LOS: 5 days   Time Spent in minutes   30 minutes  Janaiyah Blackard D.O. on 04/01/2020 at 1:25 PM  Between 7am to 7pm - Please see pager noted on [email or web address]  After 7pm go to [email or web address]  And look for the night coverage person covering for me after hours  [facility] Office  [phone]

## 2020-04-02 DIAGNOSIS — R4182 Altered mental status, unspecified: Secondary | ICD-10-CM | POA: Diagnosis not present

## 2020-04-02 DIAGNOSIS — I482 Chronic atrial fibrillation, unspecified: Secondary | ICD-10-CM | POA: Diagnosis not present

## 2020-04-02 DIAGNOSIS — J449 Chronic obstructive pulmonary disease, unspecified: Secondary | ICD-10-CM | POA: Diagnosis not present

## 2020-04-02 DIAGNOSIS — E1159 Type 2 diabetes mellitus with other circulatory complications: Secondary | ICD-10-CM | POA: Diagnosis not present

## 2020-04-02 MED ORDER — ACETAMINOPHEN 325 MG PO TABS
650.0000 mg | ORAL_TABLET | Freq: Four times a day (QID) | ORAL | Status: DC | PRN
  Administered 2020-04-03: 650 mg via ORAL
  Filled 2020-04-02: qty 2

## 2020-04-02 MED ORDER — FLUTICASONE PROPIONATE 50 MCG/ACT NA SUSP
1.0000 | Freq: Every day | NASAL | Status: DC
  Administered 2020-04-02 – 2020-04-04 (×3): 1 via NASAL
  Filled 2020-04-02: qty 16

## 2020-04-02 NOTE — TOC Progression Note (Signed)
Transition of Care [facility]) - Progression Note    Patient Details  Name: Dawn Morrow MRN: [id number] Date of Birth: [birth year]  Transition of Care [facility]) CM/SW [place],  Phone Number: 04/02/2020, 2:49 PM  Clinical Narrative:   Bed offers provided to patient's niece, chose [place]. [facility] will have a bed available for patient. Patient's PASRR is under manual review. CSW sent updated information to [facility] for review. CSW to follow.      Barriers to Discharge: Continued Medical Work up  Expected Discharge Plan and Services         Living arrangements for the past 2 months: [place]                                       Social Determinants of Health (SDOH) Interventions    Readmission Risk Interventions Readmission Risk Prevention Plan 10/03/2019  Transportation Screening Complete  PCP or Specialist Appt within 3-5 Days Complete  HRI or [place] Not Complete  HRI or Home Care Consult comments Patient at her baseline  Social Work Consult for [place] Planning/Counseling [place] Not Applicable  Medication Review [employer]) Referral to Pharmacy  Some recent data might be hidden

## 2020-04-02 NOTE — Progress Notes (Signed)
Name: Dawn Morrow DOB: 1936-06-19  Please be advised that the above-named patient will require a short-term nursing home stay -- anticipated 30 days or less for rehabilitation and strengthening. The plan is for return home.

## 2020-04-02 NOTE — Progress Notes (Signed)
PROGRESS NOTE    Dawn Morrow  PFX:[id number] DOB: 06-Aug-1936 DOA: 03/26/2020 PCP: Colon Branch, MD   Brief Narrative:  HPI on 03/26/2020 by Dr. Ileene Musa Dawn Morrow is a 84 y.o. female with medical history significant for COPD with chronic hypoxemia on 3 L, atrial fibrillation, PE/DVT on Xarelto, hypertension, CKD stage 3a, type 2 diabetes, hypothyroidism, MGUS who presents with concerns of altered mental status.  Patient unable to provide any history as to why she is here in the ED.  Per ED documentation, nursing staff at her SNF thought that she was different from her baseline.  Needing more assistance with transfer.  She was last admitted to the hospital in April with altered mental status due to E. coli UTI. Assessment & Plan   Acute metabolic encephalopathy secondary to UTI -Back to baseline- currently AAOx3 -CT head unremarkable -Chest x-rayshowing cardiomegaly, but no infection -UA showing trace leukocytes, negative nitrites, few bacteria -Of note, patient was hospitalized recently for E. coli UTI -Urine culture >100k  Enterococcus faecalis- only sensitive to ampillicin, nitrofurantoin, vanc- however patient has a PCN allergy -Patient was placed on vancomycin for 3 days -TSH 5.32, possibly reactive  Acute the injury on chronic kidney disease, stage IIIa -Creatinine 1.8 on admission, baseline is approximately 1.1 -Given IV fluids -Creatinine 1.37 -Continue to monitor BMP  Recurrent PE/DVT/ Atrial fibrillation with intermittent RVR -Continue Xarelto -patient with intermittent HR up to 130s, but asymptomatic- given cardizem 5mg - HR currently controlled  -Continue coreg -Troponins obtained and were unremarkable  Leukocytosis -WBC were up to 13, however down to 10.3 -Patient completed 3 days of vancomycin -blood cultures show no growth to date -patient also noted to have mild erythema of the right foot- Xray obtained and unremarkable -Chest x-ray shows worsening hazy  airspace density over the left base/retrocardiac region may be atelectasis or infection -Patient with no cough on exam or shortness of breath -Will order incentive spirometry -continue to monitor CBC  Essential hypertension -BP currently stable, continue coreg  Hypothyroidism -TSH is elevated however this may be reactive -Would not change Synthroid dose at this time given possible infection -Should have repeat thyroid function tests as an outpatient  COPD with chronic respiratory hypoxic failure -Continue baseline of 3 L of nasal cannula  Diabetes mellitus, type II -Hemoglobin A1c was 6.1 in April 2021 -Appears to be controlled -Monitor CBGs, continue insulin sliding scale  GERD -continue PPI  Right foot-possible sprain -X-ray was unremarkable -Patient with slight edema as well as bruising noted only top and lateral aspect of her foot -She denies rolling her ankle -PT consulted -patient feels her foot is less sore, but has had R knee issues -Continue ice  Ear pain -left sided -denies headache, dizziness, ringing -cannot provide me with more information -will continue to monitor -no erythema noted   DVT Prophylaxis  Xarelto  Code Status: Full  Family Communication: None at bedside  Disposition Plan:  Status is: Inpatient  Remains inpatient appropriate because:IV treatments appropriate due to intensity of illness or inability to take PO   Dispo: The patient is from: ALF              Anticipated d/c is to: SNF              Anticipated d/c date is: 2 days              Patient currently is not medically stable to d/c.   Consultants None  Procedures  None  Antibiotics   Anti-infectives (From admission, onward)   Start     Dose/Rate Route Frequency Ordered Stop   03/28/20 1100  vancomycin (VANCOCIN) IVPB 1000 mg/200 mL premix        1,000 mg 200 mL/hr over 60 Minutes Intravenous Every 24 hours 03/28/20 0952 03/30/20 1132   03/26/20 2200  aztreonam  (AZACTAM) 2 g in sodium chloride 0.9 % 100 mL IVPB  Status:  Discontinued        2 g 200 mL/hr over 30 Minutes Intravenous Every 8 hours 03/26/20 1953 03/28/20 0948      Subjective:   Dawn Morrow seen and examined today.  Denies further knee or foot pain, now complains of ear pain but cannot further elaborate. Also stated that I entered her room from under the bed. Denies chest pain, shortness of breath, abdominal pain, N/V/D/C. Objective:   Vitals:   04/01/20 1930 04/01/20 2349 04/02/20 0356 04/02/20 0823  BP: (!) 119/91 (!) 146/89 (!) 148/90 (!) 162/95  Pulse: 51 64 81 74  Resp: 18 18 18 20   Temp: 97.6 F (36.4 C) 97.9 F (36.6 C) 97.8 F (36.6 C) 97.8 F (36.6 C)  TempSrc: Oral Oral Oral Oral  SpO2: 94% 93% 98% 98%  Weight:        Intake/Output Summary (Last 24 hours) at 04/02/2020 1049 Last data filed at 04/01/2020 2346 Gross per 24 hour  Intake 120 ml  Output 950 ml  Net -830 ml   Filed Weights   03/29/20 1635  Weight: (!) 94.8 kg   Exam  General: Well developed, well nourished, NAD, appears stated age  66: NCAT, mucous membranes moist.   Cardiovascular: S1 S2 auscultated, irregular  Respiratory: Clear to auscultation bilaterally with equal chest rise  Abdomen: Soft, nontender, nondistended, + bowel sounds  Extremities: warm dry without cyanosis clubbing or edema  Neuro: AAOx3 (self, place, time), but makes strange comments   Psych: Pleasant  Data Reviewed: I have personally reviewed following labs and imaging studies  CBC: Recent Labs  Lab 03/26/20 1414 03/26/20 1414 03/27/20 0427 03/28/20 0526 03/29/20 1241 03/30/20 0212 03/31/20 0418  WBC 12.7*   < > 13.6* 10.9* 11.0* 13.0* 10.3  NEUTROABS 10.0*  --   --   --   --   --   --   HGB 12.7   < > 13.4 13.4 12.2 12.1 12.4  HCT 42.2   < > 43.1 42.8 39.7 39.8 40.1  MCV 96.3   < > 95.8 95.5 94.5 94.8 96.6  PLT 162   < > 163 170 177 181 193   < > = values in this interval not displayed.   Basic  Metabolic Panel: Recent Labs  Lab 03/27/20 0427 03/28/20 0526 03/29/20 1241 03/30/20 0212 03/31/20 0418  NA 140 140 137 139 140  K 4.0 4.0 4.3 4.4 4.5  CL 101 102 102 101 104  CO2 30 29 26 28 30   GLUCOSE 108* 129* 186* 129* 120*  BUN 21 18 21  26* 26*  CREATININE 1.51* 1.35* 1.31* 1.55* 1.37*  CALCIUM 8.7* 8.8* 8.6* 8.7* 8.8*   GFR: Estimated Creatinine Clearance: 35.3 mL/min (A) (by C-G formula based on SCr of 1.37 mg/dL (H)). Liver Function Tests: Recent Labs  Lab 03/26/20 1414  AST 19  ALT 15  ALKPHOS 75  BILITOT 1.1  PROT 7.2  ALBUMIN 3.2*   No results for input(s): LIPASE, AMYLASE in the last 168 hours. No results for input(s): AMMONIA in the last 168  hours. Coagulation Profile: Recent Labs  Lab 03/26/20 1518  INR 1.3*   Cardiac Enzymes: No results for input(s): CKTOTAL, CKMB, CKMBINDEX, TROPONINI in the last 168 hours. BNP (last 3 results) No results for input(s): PROBNP in the last 8760 hours. HbA1C: No results for input(s): HGBA1C in the last 72 hours. CBG: Recent Labs  Lab 03/26/20 1424  GLUCAP 109*   Lipid Profile: No results for input(s): CHOL, HDL, LDLCALC, TRIG, CHOLHDL, LDLDIRECT in the last 72 hours. Thyroid Function Tests: No results for input(s): TSH, T4TOTAL, FREET4, T3FREE, THYROIDAB in the last 72 hours. Anemia Panel: No results for input(s): VITAMINB12, FOLATE, FERRITIN, TIBC, IRON, RETICCTPCT in the last 72 hours. Urine analysis:    Component Value Date/Time   COLORURINE YELLOW 03/26/2020 1705   APPEARANCEUR HAZY (A) 03/26/2020 1705   LABSPEC 1.019 03/26/2020 1705   PHURINE 5.0 03/26/2020 1705   GLUCOSEU NEGATIVE 03/26/2020 1705   GLUCOSEU NEGATIVE 06/12/2014 1410   HGBUR SMALL (A) 03/26/2020 1705   BILIRUBINUR NEGATIVE 03/26/2020 1705   BILIRUBINUR Neg 06/19/2016 1101   KETONESUR NEGATIVE 03/26/2020 1705   PROTEINUR 30 (A) 03/26/2020 1705   UROBILINOGEN >=8.0 06/19/2016 1101   UROBILINOGEN 0.2 05/31/2015 1835   NITRITE  NEGATIVE 03/26/2020 1705   LEUKOCYTESUR TRACE (A) 03/26/2020 1705   Sepsis Labs: @LABRCNTIP (procalcitonin:4,lacticidven:4)  ) Recent Results (from the past 240 hour(s))  Urine culture     Status: Abnormal   Collection Time: 03/26/20  5:05 PM   Specimen: Urine, Catheterized  Result Value Ref Range Status   Specimen Description URINE, CATHETERIZED  Final   Special Requests   Final    NONE Performed at [facility], [place] [address] [place], [place] [address]    Culture >=100,000 COLONIES/mL ENTEROCOCCUS FAECALIS (A)  Final   Report Status 03/28/2020 FINAL  Final   Organism ID, Bacteria ENTEROCOCCUS FAECALIS (A)  Final      Susceptibility   Enterococcus faecalis - MIC*    AMPICILLIN <=2 SENSITIVE Sensitive     NITROFURANTOIN <=16 SENSITIVE Sensitive     VANCOMYCIN 1 SENSITIVE Sensitive     * >=100,000 COLONIES/mL ENTEROCOCCUS FAECALIS  SARS Coronavirus 2 by RT PCR (hospital order, performed in [place] hospital lab) Nasopharyngeal Nasopharyngeal Swab     Status: None   Collection Time: 03/26/20  7:54 PM   Specimen: Nasopharyngeal Swab  Result Value Ref Range Status   SARS Coronavirus 2 NEGATIVE NEGATIVE Final    Comment: (NOTE) SARS-CoV-2 target nucleic acids are NOT DETECTED.  The SARS-CoV-2 RNA is generally detectable in upper and lower respiratory specimens during the acute phase of infection. The lowest concentration of SARS-CoV-2 viral copies this assay can detect is 250 copies / mL. A negative result does not preclude SARS-CoV-2 infection and should not be used as the sole basis for treatment or other patient management decisions.  A negative result may occur with improper specimen collection / handling, submission of specimen other than nasopharyngeal swab, presence of viral mutation(s) within the areas targeted by this assay, and inadequate number of viral copies (<250 copies / mL). A negative result must be combined with clinical observations, patient  history, and epidemiological information.  Fact Sheet for Patients:   StrictlyIdeas.no  Fact Sheet for Healthcare Providers: BankingDealers.co.za  This test is not yet approved or  cleared by the [place] FDA and has been authorized for detection and/or diagnosis of SARS-CoV-2 by FDA under an Emergency Use Authorization (EUA).  This EUA will remain in effect (meaning  this test can be used) for the duration of the COVID-19 declaration under Section 564(b)(1) of the Act, 21 U.S.C. section 360bbb-3(b)(1), unless the authorization is terminated or revoked sooner.  Performed at [facility], [place] [address] [place], [place] [address]   MRSA PCR Screening     Status: Abnormal   Collection Time: 03/27/20 12:37 AM   Specimen: Nasal Mucosa; Nasopharyngeal  Result Value Ref Range Status   MRSA by PCR POSITIVE (A) NEGATIVE Final    Comment: CRITICAL RESULT CALLED TO, READ BACK BY AND VERIFIED WITH: RN Teodora Medici [id number] @0216  THANEY Performed at [facility] [address], [place], [place] [address]   Culture, blood (routine x 2)     Status: None (Preliminary result)   Collection Time: 03/30/20 12:28 PM   Specimen: BLOOD  Result Value Ref Range Status   Specimen Description BLOOD RIGHT ANTECUBITAL  Final   Special Requests   Final    BOTTLES DRAWN AEROBIC AND ANAEROBIC Blood Culture results may not be optimal due to an inadequate volume of blood received in culture bottles   Culture   Final    NO GROWTH 2 DAYS Performed at [facility], [place] [address] [place], [place] [address]    Report Status PENDING  Incomplete  Culture, blood (routine x 2)     Status: None (Preliminary result)   Collection Time: 03/30/20 12:33 PM   Specimen: BLOOD  Result Value Ref Range Status   Specimen Description BLOOD RIGHT ANTECUBITAL  Final   Special Requests   Final    BOTTLES DRAWN AEROBIC ONLY Blood Culture adequate volume    Culture   Final    NO GROWTH 2 DAYS Performed at [facility], [place] [address] [place], [place] [address]    Report Status PENDING  Incomplete      Radiology Studies: No results found.   Scheduled Meds: . atorvastatin  10 mg Oral QHS  . buPROPion  450 mg Oral Daily  . carvedilol  6.25 mg Oral BID WC  . docusate sodium  100 mg Oral Daily  . ezetimibe  10 mg Oral Daily  . gabapentin  300 mg Oral BID WC  . gabapentin  600 mg Oral QHS  . levothyroxine  88 mcg Oral QAC breakfast  . rivaroxaban  20 mg Oral Q supper  . sertraline  75 mg Oral Q1200   Continuous Infusions: . sodium chloride 50 mL/hr at 04/01/20 1415     LOS: 6 days   Time Spent in minutes   30 minutes  Arian Mcquitty D.O. on 04/02/2020 at 10:49 AM  Between 7am to 7pm - Please see pager noted on [email or web address]  After 7pm go to [email or web address]  And look for the night coverage person covering for me after hours  [facility] Office  [phone]

## 2020-04-02 NOTE — Progress Notes (Signed)
Occupational Therapy Treatment Patient Details Name: Dawn Morrow MRN: [id number] DOB: Feb 01, 1936 Today's Date: 04/02/2020    History of present illness Pt is an 84 y/o female admitted secondary to acute metabolic encephalopathy; possibly from UTI. CT head is negative for acute abnormality. PMH includes DM, HTN, COPD, PE, DVT, and a fib.    OT comments  Patient continues to make steady progress towards goals in skilled OT session. Patient's session encompassed bed level exercises and ADLs as pt was markedly different in cognition and arousal level. Pt able to maintain eyes open for less than a few seconds, requiring mod-max A to wash face, and able to follow cues for minimal bed level exercises (max multi-modal cues needed) before falling asleep. Pt only oriented to person at time of session, with RN notified of change. Discharge remains appropriate at this time; will continue to follow acutely.    Follow Up Recommendations  SNF;Supervision/Assistance - 24 hour ([facility] if able to provide services)    Equipment Recommendations  Other (comment) (TBD)    Recommendations for Other Services      Precautions / Restrictions Precautions Precautions: Fall Restrictions Weight Bearing Restrictions: No       Mobility Bed Mobility               General bed mobility comments: Deferred to arousal level, see bed level exercises  Transfers                 General transfer comment: Deferred to arousal level, see bed level exercises    Balance                                           ADL either performed or assessed with clinical judgement   ADL Overall ADL's : Needs assistance/impaired     Grooming: Maximal assistance;Wash/dry face;Wash/dry hands Grooming Details (indicate cue type and reason): Minimal eye opening to date, tremulous requiring increased assist to wash face             Lower Body Dressing: Total assistance Lower Body Dressing  Details (indicate cue type and reason): To adjust socks at bed level             Functional mobility during ADLs: Total assistance;Maximal assistance General ADL Comments: Difficult to arouse to date, requiring increased assist to complete all tasks, session pivoted to bed level exercises due to fatigue     Vision       Perception     Praxis      Cognition Arousal/Alertness: Lethargic Behavior During Therapy: Flat affect Overall Cognitive Status: Impaired/Different from baseline Area of Impairment: Orientation;Attention;Following commands;Safety/judgement;Awareness;Problem solving                 Orientation Level: Disoriented to;Place;Time;Situation Current Attention Level: Sustained   Following Commands: Follows one step commands with increased time Safety/Judgement: Decreased awareness of safety;Decreased awareness of deficits Awareness: Intellectual Problem Solving: Slow processing;Decreased initiation;Requires verbal cues;Requires tactile cues General Comments: Pt with increased slowed processing to date, unable to arouse to get to EOB safely, RN notified due to marked cognition difference        Exercises Low Level/ICU Exercises Ankle Circles/Pumps: AROM;Both;10 reps Quad Sets: AROM;Both;5 reps Hip Extension: AROM;Both;5 reps   Shoulder Instructions       General Comments      Pertinent Vitals/ Pain       Pain  Assessment: Faces Faces Pain Scale: Hurts a little bit Pain Location: Unable to state where, but would call out in pain Pain Descriptors / Indicators: Grimacing;Guarding;Sharp Pain Intervention(s): Limited activity within patient's tolerance;Monitored during session  Home Living                                          Prior Functioning/Environment              Frequency  Min 2X/week        Progress Toward Goals  OT Goals(current goals can now be found in the care plan section)  Progress towards OT goals:  Not progressing toward goals - comment (Distinct change in cognition, RN notified)  Acute Rehab OT Goals Patient Stated Goal: None stated OT Goal Formulation: Patient unable to participate in goal setting Time For Goal Achievement: 04/11/20 Potential to Achieve Goals: [place] Discharge plan remains appropriate    Co-evaluation                 AM-PAC OT "6 Clicks" Daily Activity     Outcome Measure   Help from another person eating meals?: A Lot Help from another person taking care of personal grooming?: A Lot Help from another person toileting, which includes using toliet, bedpan, or urinal?: Total Help from another person bathing (including washing, rinsing, drying)?: A Lot Help from another person to put on and taking off regular upper body clothing?: A Lot Help from another person to put on and taking off regular lower body clothing?: Total 6 Click Score: 10    End of Session    OT Visit Diagnosis: Unsteadiness on feet (R26.81);Other abnormalities of gait and mobility (R26.89);Muscle weakness (generalized) (M62.81);Other symptoms and signs involving cognitive function   Activity Tolerance Patient limited by fatigue;Other (comment) (Difficult to arouse, RN notified)   Patient Left in bed;with call bell/phone within reach;with bed alarm set   Nurse Communication Mobility status        Time: 1638-4536 OT Time Calculation (min): 9 min  Charges: OT General Charges $OT Visit: 1 Visit OT Treatments $Self Care/Home Management : 8-22 mins  Corinne Ports E. Aftyn Nott, COTA/L Acute Rehabilitation Services [phone] [place] 04/02/2020, 10:25 AM

## 2020-04-02 NOTE — Progress Notes (Signed)
   04/01/20 1930  Assess: MEWS Score  Temp 97.6 F (36.4 C)  BP (!) 119/91  Pulse Rate 51  ECG Heart Rate (!) 112  Resp 18  SpO2 94 %  O2 Device Room Air  Assess: MEWS Score  MEWS Temp 0  MEWS Systolic 0  MEWS Pulse 2  MEWS RR 0  MEWS LOC 0  MEWS Score 2  MEWS Score Color Yellow  Assess: if the MEWS score is Yellow or Red  Were vital signs taken at a resting state? Yes  Early Detection of Sepsis Score *See Row Information* Low  MEWS guidelines implemented *See Row Information* No, vital signs rechecked (Pt in A-fib, occ. elevated HR; pt denies sx)

## 2020-04-02 NOTE — Progress Notes (Signed)
Physical Therapy Treatment Patient Details Name: Dawn Morrow MRN: [id number] DOB: [birth year] Today's Date: 04/02/2020    History of Present Illness Pt is an 84 y/o female admitted secondary to acute metabolic encephalopathy; possibly from UTI. CT head is negative for acute abnormality. PMH includes DM, HTN, COPD, PE, DVT, and a fib.     PT Comments    Patient awake, alert and agreeable to participate. Patient with incr rt knee pain last visit, therefore initiated session with AROM/AAROM/PROM of LEs. After exercises, initiated coming to sit at EOB and pt refusing due to fatigue. Unable to persuade her to attempt EOB.     Follow Up Recommendations  SNF;Supervision/Assistance - 24 hour (ALF cannot take pt back at current level)     Equipment Recommendations  None recommended by PT    Recommendations for Other Services       Precautions / Restrictions Precautions Precautions: Fall    Mobility  Bed Mobility               General bed mobility comments: Patient refused after bed exercises  Transfers                    Ambulation/Gait                 Stairs             Wheelchair Mobility    Modified Rankin (Stroke Patients Only)       Balance                                            Cognition Arousal/Alertness: Awake/alert Behavior During Therapy: Flat affect Overall Cognitive Status: Impaired/Different from baseline Area of Impairment: Orientation;Attention;Following commands;Safety/judgement;Awareness;Problem solving                 Orientation Level: Disoriented to;Place;Time;Situation Current Attention Level: Sustained   Following Commands: Follows one step commands with increased time Safety/Judgement: Decreased awareness of safety;Decreased awareness of deficits Awareness: Intellectual Problem Solving: Slow processing;Decreased initiation;Requires verbal cues;Requires tactile cues General Comments:  processing and responding to ?'s or commands more quickly today. Disorientation kpersists      Exercises General Exercises - Lower Extremity Ankle Circles/Pumps: AROM;Both;10 reps Heel Slides: AAROM;Supine;10 reps;Both (resisted extension) Other Exercises Other Exercises: bil hip internal rotation ("touch your big toes together) as pt tends to lie with legs in external rotation). After 5 reps, pt then able to tolerate PROM into neutral    General Comments        Pertinent Vitals/Pain Pain Assessment: Faces Faces Pain Scale: Hurts a little bit Pain Location: with LE AAROM; at knees Pain Descriptors / Indicators: Grimacing;Guarding;Sharp Pain Intervention(s): Limited activity within patient's tolerance;Monitored during session    Home Living                      Prior Function            PT Goals (current goals can now be found in the care plan section) Acute Rehab PT Goals Patient Stated Goal: None stated Time For Goal Achievement: 04/10/20 Potential to Achieve Goals: Good Progress towards PT goals: Progressing toward goals    Frequency    Min 2X/week      PT Plan Current plan remains appropriate    Co-evaluation  AM-PAC PT "6 Clicks" Mobility   Outcome Measure  Help needed turning from your back to your side while in a flat bed without using bedrails?: A Little Help needed moving from lying on your back to sitting on the side of a flat bed without using bedrails?: A Little Help needed moving to and from a bed to a chair (including a wheelchair)?: Total Help needed standing up from a chair using your arms (e.g., wheelchair or bedside chair)?: A Lot Help needed to walk in hospital room?: Total Help needed climbing 3-5 steps with a railing? : Total 6 Click Score: 11    End of Session   Activity Tolerance: Patient limited by fatigue Patient left: in bed;with call bell/phone within reach;with bed alarm set   PT Visit Diagnosis:  Unsteadiness on feet (R26.81);Muscle weakness (generalized) (M62.81)     Time: 2761-4709 PT Time Calculation (min) (ACUTE ONLY): 27 min  Charges:  $Therapeutic Exercise: 23-37 mins                      Arby Barrette, PT Pager [phone]    Rexanne Mano 04/02/2020, 3:53 PM

## 2020-04-03 DIAGNOSIS — R4182 Altered mental status, unspecified: Secondary | ICD-10-CM | POA: Diagnosis not present

## 2020-04-03 LAB — CBC
HCT: 38.4 % (ref 36.0–46.0)
Hemoglobin: 12.1 g/dL (ref 12.0–15.0)
MCH: 29.6 pg (ref 26.0–34.0)
MCHC: 31.5 g/dL (ref 30.0–36.0)
MCV: 93.9 fL (ref 80.0–100.0)
Platelets: 272 10*3/uL (ref 150–400)
RBC: 4.09 MIL/uL (ref 3.87–5.11)
RDW: 17.2 % — ABNORMAL HIGH (ref 11.5–15.5)
WBC: 9.6 10*3/uL (ref 4.0–10.5)
nRBC: 0 % (ref 0.0–0.2)

## 2020-04-03 LAB — BASIC METABOLIC PANEL
Anion gap: 9 (ref 5–15)
BUN: 19 mg/dL (ref 8–23)
CO2: 27 mmol/L (ref 22–32)
Calcium: 8.8 mg/dL — ABNORMAL LOW (ref 8.9–10.3)
Chloride: 104 mmol/L (ref 98–111)
Creatinine, Ser: 1.15 mg/dL — ABNORMAL HIGH (ref 0.44–1.00)
GFR calc Af Amer: 51 mL/min — ABNORMAL LOW (ref >60)
GFR calc non Af Amer: 44 mL/min — ABNORMAL LOW (ref >60)
Glucose, Bld: 113 mg/dL — ABNORMAL HIGH (ref 70–99)
Potassium: 4.2 mmol/L (ref 3.5–5.1)
Sodium: 140 mmol/L (ref 135–145)

## 2020-04-03 LAB — SARS CORONAVIRUS 2 (TAT 6-24 HRS): SARS Coronavirus 2: NEGATIVE

## 2020-04-03 NOTE — Progress Notes (Signed)
PROGRESS NOTE    Dawn Morrow  QBH:[id number] DOB: 11/06/1935 DOA: 03/26/2020 PCP: Colon Branch, MD   Brief Narrative:  HPI on 03/26/2020 by Dr. Ileene Musa Dawn Morrow is a 84 y.o. female with medical history significant for COPD with chronic hypoxemia on 3 L, atrial fibrillation, PE/DVT on Xarelto, hypertension, CKD stage 3a, type 2 diabetes, hypothyroidism, MGUS who presents with concerns of altered mental status. Patient unable to provide any history as to why she is here in the ED.  Per ED documentation, nursing staff at her SNF thought that she was different from her baseline.  Needing more assistance with transfer.  She was last admitted to the hospital in April with altered mental status due to E. coli UTI.  Assessment & Plan   Acute metabolic encephalopathy secondary to UTI -Back to baseline- currently AAOx3 -CT head unremarkable -Chest x-rayshowing cardiomegaly, but no infection -UA showing trace leukocytes, negative nitrites, few bacteria -Patient hospitalized recently for E. coli UTI -Urine culture >100k  Enterococcus faecalis- only sensitive to ampillicin, nitrofurantoin, vanc- however patient has a PCN allergy -Patient was placed on vancomycin for 3 days - now completed therapy -TSH 5.32, possibly reactive  Acute the injury on chronic kidney disease, stage IIIa -Creatinine 1.8 on admission, baseline is approximately 1.1 -Given IV fluids -Creatinine 1.37 -Continue to monitor BMP  Recurrent PE/DVT/ Atrial fibrillation with intermittent RVR -Continue Xarelto -patient with intermittent HR up to 130s, but asymptomatic- given cardizem 5mg - HR currently controlled  -Continue coreg -Troponins obtained and were unremarkable  Leukocytosis -WBC were up to 13, however down to 10.3 -Patient completed 3 days of vancomycin -blood cultures show no growth to date -patient also noted to have mild erythema of the right foot- Xray obtained and unremarkable -Chest x-ray shows worsening  hazy airspace density over the left base/retrocardiac region may be atelectasis or infection -Patient with no cough on exam or shortness of breath -Will order incentive spirometry -continue to monitor CBC  Essential hypertension -BP currently stable, continue coreg  Hypothyroidism -TSH is elevated however this may be reactive -Would not change Synthroid dose at this time given possible infection -Should have repeat thyroid function tests as an outpatient  COPD with chronic respiratory hypoxic failure -Continue baseline of 3 L of nasal cannula  Diabetes mellitus, type II -Hemoglobin A1c was 6.1 in April 2021 -Appears to be controlled -Monitor CBGs, continue insulin sliding scale  GERD -continue PPI  Right foot-possible sprain -X-ray was unremarkable -Patient with slight edema as well as bruising noted only top and lateral aspect of her foot -She denies rolling her ankle -PT consulted -patient feels her foot is less sore, but has had R knee issues -Continue ice  Ear pain -left sided -denies headache, dizziness, ringing -cannot provide me with more information -will continue to monitor -no erythema noted   DVT Prophylaxis  Xarelto  Code Status: Full  Family Communication: None at bedside  Disposition Plan:  Status is: Inpatient  Remains inpatient appropriate because:Unsafe d/c plan   Dispo: The patient is from: ALF              Anticipated d/c is to: SNF              Anticipated d/c date is: 24h              Patient currently is medically stable to d/c.   Consultants None  Procedures  None  Antibiotics   Anti-infectives (From admission, onward)   Start  Dose/Rate Route Frequency Ordered Stop   03/28/20 1100  vancomycin (VANCOCIN) IVPB 1000 mg/200 mL premix        1,000 mg 200 mL/hr over 60 Minutes Intravenous Every 24 hours 03/28/20 0952 03/30/20 1132   03/26/20 2200  aztreonam (AZACTAM) 2 g in sodium chloride 0.9 % 100 mL IVPB  Status:   Discontinued        2 g 200 mL/hr over 30 Minutes Intravenous Every 8 hours 03/26/20 1953 03/28/20 0948      Subjective:   No acute issues or events overnight, denies nausea, vomiting, diarrhea, constipation, headache, fevers, chills.  Objective:   Vitals:   04/02/20 1742 04/02/20 1911 04/02/20 2255 04/03/20 0347  BP: (!) 152/85 (!) 151/83 (!) 145/82 (!) 143/88  Pulse: 78 81 81 80  Resp: 18 18 18 18   Temp: 99.5 F (37.5 C) 98.8 F (37.1 C) 98.2 F (36.8 C) 98.5 F (36.9 C)  TempSrc: Oral Oral Oral Oral  SpO2: 98% 97% 100% 98%  Weight:        Intake/Output Summary (Last 24 hours) at 04/03/2020 2094 Last data filed at 04/03/2020 0347 Gross per 24 hour  Intake --  Output 810 ml  Net -810 ml   Filed Weights   03/29/20 1635  Weight: (!) 94.8 kg   Exam  General: Well developed, well nourished, NAD, appears stated age  88: NCAT, mucous membranes moist.   Cardiovascular: S1 S2 auscultated, irregular  Respiratory: Clear to auscultation bilaterally with equal chest rise  Abdomen: Soft, nontender, nondistended, + bowel sounds  Extremities: warm dry without cyanosis clubbing or edema  Neuro: AAOx3 (self, place, time), but makes strange comments   Psych: Pleasant  Data Reviewed: I have personally reviewed following labs and imaging studies  CBC: Recent Labs  Lab 03/28/20 0526 03/29/20 1241 03/30/20 0212 03/31/20 0418 04/03/20 0337  WBC 10.9* 11.0* 13.0* 10.3 9.6  HGB 13.4 12.2 12.1 12.4 12.1  HCT 42.8 39.7 39.8 40.1 38.4  MCV 95.5 94.5 94.8 96.6 93.9  PLT 170 177 181 193 709   Basic Metabolic Panel: Recent Labs  Lab 03/28/20 0526 03/29/20 1241 03/30/20 0212 03/31/20 0418 04/03/20 0337  NA 140 137 139 140 140  K 4.0 4.3 4.4 4.5 4.2  CL 102 102 101 104 104  CO2 29 26 28 30 27   GLUCOSE 129* 186* 129* 120* 113*  BUN 18 21 26* 26* 19  CREATININE 1.35* 1.31* 1.55* 1.37* 1.15*  CALCIUM 8.8* 8.6* 8.7* 8.8* 8.8*     Recent Results (from the past  240 hour(s))  Urine culture     Status: Abnormal   Collection Time: 03/26/20  5:05 PM   Specimen: Urine, Catheterized  Result Value Ref Range Status   Specimen Description URINE, CATHETERIZED  Final   Special Requests   Final    NONE Performed at [facility], [address], [place], [place] [address]    Culture >=100,000 COLONIES/mL ENTEROCOCCUS FAECALIS (A)  Final   Report Status 03/28/2020 FINAL  Final   Organism ID, Bacteria ENTEROCOCCUS FAECALIS (A)  Final      Susceptibility   Enterococcus faecalis - MIC*    AMPICILLIN <=2 SENSITIVE Sensitive     NITROFURANTOIN <=16 SENSITIVE Sensitive     VANCOMYCIN 1 SENSITIVE Sensitive     * >=100,000 COLONIES/mL ENTEROCOCCUS FAECALIS  SARS Coronavirus 2 by RT PCR (hospital order, performed in [facility] lab) Nasopharyngeal Nasopharyngeal Swab     Status: None   Collection  Time: 03/26/20  7:54 PM   Specimen: Nasopharyngeal Swab  Result Value Ref Range Status   SARS Coronavirus 2 NEGATIVE NEGATIVE Final    Comment: (NOTE) SARS-CoV-2 target nucleic acids are NOT DETECTED.  The SARS-CoV-2 RNA is generally detectable in upper and lower respiratory specimens during the acute phase of infection. The lowest concentration of SARS-CoV-2 viral copies this assay can detect is 250 copies / mL. A negative result does not preclude SARS-CoV-2 infection and should not be used as the sole basis for treatment or other patient management decisions.  A negative result may occur with improper specimen collection / handling, submission of specimen other than nasopharyngeal swab, presence of viral mutation(s) within the areas targeted by this assay, and inadequate number of viral copies (<250 copies / mL). A negative result must be combined with clinical observations, patient history, and epidemiological information.  Fact Sheet for Patients:   StrictlyIdeas.no  Fact Sheet for Healthcare  Providers: BankingDealers.co.za  This test is not yet approved or  cleared by the [place] FDA and has been authorized for detection and/or diagnosis of SARS-CoV-2 by FDA under an Emergency Use Authorization (EUA).  This EUA will remain in effect (meaning this test can be used) for the duration of the COVID-19 declaration under Section 564(b)(1) of the Act, 21 U.S.C. section 360bbb-3(b)(1), unless the authorization is terminated or revoked sooner.  Performed at [facility], [place] [address], [place], [place] [address]   MRSA PCR Screening     Status: Abnormal   Collection Time: 03/27/20 12:37 AM   Specimen: Nasal Mucosa; Nasopharyngeal  Result Value Ref Range Status   MRSA by PCR POSITIVE (A) NEGATIVE Final    Comment: CRITICAL RESULT CALLED TO, READ BACK BY AND VERIFIED WITH: RN Teodora Medici [id number] @0216  THANEY Performed at [place] [address] [place], [place] [address]   Culture, blood (routine x 2)     Status: None (Preliminary result)   Collection Time: 03/30/20 12:28 PM   Specimen: BLOOD  Result Value Ref Range Status   Specimen Description BLOOD RIGHT ANTECUBITAL  Final   Special Requests   Final    BOTTLES DRAWN AEROBIC AND ANAEROBIC Blood Culture results may not be optimal due to an inadequate volume of blood received in culture bottles   Culture   Final    NO GROWTH 3 DAYS Performed at [facility], [place] [address] [place], [place] [address]    Report Status PENDING  Incomplete  Culture, blood (routine x 2)     Status: None (Preliminary result)   Collection Time: 03/30/20 12:33 PM   Specimen: BLOOD  Result Value Ref Range Status   Specimen Description BLOOD RIGHT ANTECUBITAL  Final   Special Requests   Final    BOTTLES DRAWN AEROBIC ONLY Blood Culture adequate volume   Culture   Final    NO GROWTH 3 DAYS Performed at [facility], [place] [address] [place], [place] [address]    Report Status PENDING   Incomplete      Radiology Studies: No results found.   Scheduled Meds:  atorvastatin  10 mg Oral QHS   buPROPion  450 mg Oral Daily   carvedilol  6.25 mg Oral BID WC   docusate sodium  100 mg Oral Daily   ezetimibe  10 mg Oral Daily   fluticasone  1 spray Each Nare Daily   gabapentin  300 mg Oral BID WC   gabapentin  600 mg Oral QHS   levothyroxine  88 mcg Oral QAC breakfast   rivaroxaban  20 mg Oral Q supper   sertraline  75 mg Oral Q1200   Continuous Infusions:  sodium chloride 50 mL/hr at 04/02/20 1114     LOS: 7 days   Time Spent in minutes   30 minutes  Little Ishikawa D.O. on 04/03/2020 at 8:14 AM

## 2020-04-04 DIAGNOSIS — J449 Chronic obstructive pulmonary disease, unspecified: Secondary | ICD-10-CM | POA: Diagnosis not present

## 2020-04-04 DIAGNOSIS — R4182 Altered mental status, unspecified: Secondary | ICD-10-CM | POA: Diagnosis not present

## 2020-04-04 DIAGNOSIS — I482 Chronic atrial fibrillation, unspecified: Secondary | ICD-10-CM | POA: Diagnosis not present

## 2020-04-04 DIAGNOSIS — E1159 Type 2 diabetes mellitus with other circulatory complications: Secondary | ICD-10-CM | POA: Diagnosis not present

## 2020-04-04 LAB — CULTURE, BLOOD (ROUTINE X 2)
Culture: NO GROWTH
Culture: NO GROWTH
Special Requests: ADEQUATE

## 2020-04-04 MED ORDER — LORAZEPAM 0.5 MG PO TABS: 0.5000 mg | ORAL_TABLET | Freq: Three times a day (TID) | ORAL | 0 refills | Status: DC | PRN

## 2020-04-04 NOTE — TOC Progression Note (Signed)
Transition of Care [facility]) - Progression Note    Patient Details  Name: Dawn Morrow MRN: [id number] Date of Birth: 08-10-1936  Transition of Care [facility]) CM/SW [place], [place] Phone Number: 04/04/2020, 10:32 AM  Clinical Narrative:   CSW completed PASRR interview for patient, awaiting PASRR number. CSW confirmed with [facility] that they can admit when PASRR received.      Barriers to Discharge: Continued Medical Work up  Expected Discharge Plan and Services         Living arrangements for the past 2 months: [place] Expected Discharge Date: 04/04/20                                     Social Determinants of Health (SDOH) Interventions    Readmission Risk Interventions Readmission Risk Prevention Plan 10/03/2019  Transportation Screening Complete  PCP or Specialist Appt within 3-5 Days Complete  HRI or [place] Not Complete  HRI or Home Care Consult comments Patient at her baseline  Social Work Consult for [place] Planning/Counseling [place] Not Applicable  Medication Review Press photographer) Referral to Pharmacy  Some recent data might be hidden

## 2020-04-04 NOTE — Care Management Important Message (Signed)
Important Message  Patient Details  Name: Dawn Morrow MRN: [id number] Date of Birth: Jan 25, 1936   Medicare Important Message Given:  Yes     Orbie Pyo 04/04/2020, 3:18 PM

## 2020-04-04 NOTE — Progress Notes (Signed)
Patient was discharged home by MD order; discharged instructions  review and give to patient with care notes and prescriptions; IV DIC; skin intact; patient will be escorted on stretcher to facility  Via [facility].  All patient belongings sent with her.  Patient in no distress

## 2020-04-04 NOTE — Progress Notes (Signed)
Called [facility] and gave report to Rachael.

## 2020-04-04 NOTE — Discharge Summary (Signed)
Physician Discharge Summary  Dawn Morrow TGG:[id number] DOB: 12-Jul-1936 DOA: 03/26/2020  PCP: Colon Branch, MD  Admit date: 03/26/2020 Discharge date: 04/04/2020  [facility] Disposition: Same  Recommendations for Outpatient Follow-up:  1. Follow up with PCP in 1-2 weeks 2. Please obtain BMP/CBC in one week  Discharge Condition: Stable CODE STATUS: Full Diet recommendation: Diabetic low-salt diet as tolerated  Brief/Interim Summary: Dawn Morrow a 84 y.o.femalewith medical history significant forCOPD with chronic hypoxemia on 3 L, atrial fibrillation, PE/DVT on Xarelto, hypertension, CKD stage 3a,type 2 diabetes, hypothyroidism, MGUS who presents with concerns of altered mental status. Patient unable to provide any history as to why she is here in the ED. Per ED documentation, nursing staff at her SNF thought that she was different from her baseline. Needing more assistance with transfer. She was last admitted to the hospital in April with altered mental status due to E. coli UTI.  Patient admitted as above with acute metabolic encephalopathy in the setting of UTI, back to baseline. Patient also had notable AKI on CKD 3 a in the setting of dehydration and above. This too has resolved back to baseline. Patient's other chronic comorbid conditions appear to be quite stable, continue home medications, otherwise stable and agreeable for discharge back to nursing facility where she currently resides.  Discharge Diagnoses:  Principal Problem:   AMS (altered mental status) Active Problems:   Hypothyroidism   Diabetes (HCC)   HTN (hypertension)   COPD (chronic obstructive pulmonary disease) (HCC)   GERD   History of pulmonary embolism   History of DVT (deep vein thrombosis)   Atrial fibrillation, chronic [facility])    Discharge Instructions  Discharge Instructions    Call MD for:  difficulty breathing, headache or visual disturbances   Complete by: As directed     Call MD for:  extreme fatigue   Complete by: As directed    Call MD for:  hives   Complete by: As directed    Call MD for:  persistant dizziness or light-headedness   Complete by: As directed    Call MD for:  persistant nausea and vomiting   Complete by: As directed    Call MD for:  severe uncontrolled pain   Complete by: As directed    Call MD for:  temperature >100.4   Complete by: As directed    Diet - low sodium heart healthy   Complete by: As directed    Increase activity slowly   Complete by: As directed      Allergies as of 04/04/2020      Reactions   Penicillins Anaphylaxis   Codeine Other (See Comments)   unknown   Sulfonamide Derivatives Other (See Comments)   unknown      Medication List    STOP taking these medications   aspirin-acetaminophen-caffeine 250-250-65 MG tablet Commonly known as: EXCEDRIN MIGRAINE     TAKE these medications   acetaminophen 500 MG tablet Commonly known as: TYLENOL Take 500 mg by mouth every 4 (four) hours as needed for mild pain or headache.   albuterol 108 (90 Base) MCG/ACT inhaler Commonly known as: VENTOLIN HFA Inhale 2 puffs into the lungs every 6 (six) hours as needed for wheezing or shortness of breath.   atorvastatin 10 MG tablet Commonly known as: LIPITOR Take 1 tablet (10 mg total) by mouth at bedtime.   buPROPion HCl ER (XL) 450 MG Tb24 Take 1 tablet by mouth daily.   carvedilol 6.25 MG  tablet Commonly known as: COREG Take 1 tablet (6.25 mg total) by mouth 2 (two) times daily with a meal.   docusate sodium 100 MG capsule Commonly known as: COLACE Take 100 mg by mouth daily.   ezetimibe 10 MG tablet Commonly known as: ZETIA Take 10 mg by mouth daily.   gabapentin 300 MG capsule Commonly known as: NEURONTIN Take 1 cap in AM, 1 cap at noon, 2 caps at bedtime What changed:   how much to take  how to take this  when to take this  additional instructions   ketoconazole 2 % shampoo Commonly known as:  NIZORAL Apply 1 application topically 2 (two) times a week. TUE & FRI   levothyroxine 88 MCG tablet Commonly known as: SYNTHROID Take 88 mcg by mouth daily before breakfast.   linagliptin 5 MG Tabs tablet Commonly known as: Tradjenta Take 1 tablet (5 mg total) by mouth at bedtime.   LORazepam 0.5 MG tablet Commonly known as: ATIVAN Take 0.5 mg by mouth every 8 (eight) hours as needed for anxiety.   meclizine 25 MG tablet Commonly known as: ANTIVERT Take 25 mg by mouth at bedtime as needed for dizziness.   ondansetron 4 MG tablet Commonly known as: ZOFRAN Take 4 mg by mouth every 8 (eight) hours as needed for nausea or vomiting.   polyethylene glycol 17 g packet Commonly known as: MIRALAX / GLYCOLAX Take 17 g by mouth daily as needed for mild constipation. Reported on 10/09/2015   repaglinide 0.5 MG tablet Commonly known as: Prandin Take 1 tablet (0.5 mg total) by mouth 3 (three) times daily before meals.   rivaroxaban 20 MG Tabs tablet Commonly known as: Xarelto Take 1 tablet (20 mg total) by mouth daily with supper.   sertraline 50 MG tablet Commonly known as: ZOLOFT Take 1.5 tablets by mouth daily at 12 noon.   Systane Balance 0.6 % Soln Generic drug: Propylene Glycol Place 1 drop into both eyes 4 (four) times daily as needed (for dry eyes).       Allergies  Allergen Reactions   Penicillins Anaphylaxis   Codeine Other (See Comments)    unknown   Sulfonamide Derivatives Other (See Comments)    unknown    Consultations:  None   Procedures/Studies: CT Head Wo Contrast  Result Date: 03/26/2020 CLINICAL DATA:  Altered mental status EXAM: CT HEAD WITHOUT CONTRAST TECHNIQUE: Contiguous axial images were obtained from the base of the skull through the vertex without intravenous contrast. COMPARISON:  CT brain 12/14/2019, MRI brain 12/16/2019 FINDINGS: Brain: No acute territorial infarction, hemorrhage or intracranial mass. Marked hypodensity in the white  matter consistent with chronic small vessel ischemic change. Chronic appearing lacunar infarcts in the basal ganglia. Stable ventricle size. Vascular: No hyperdense vessels.  Carotid vascular calcification. Skull: Normal. Negative for fracture or focal lesion. Sinuses/Orbits: No acute finding. Other: None IMPRESSION: 1. No CT evidence for acute intracranial abnormality. 2. Chronic small vessel ischemic changes of the white matter. Electronically Signed   By: Donavan Foil M.D.   On: 03/26/2020 15:53   DG CHEST PORT 1 VIEW  Result Date: 03/30/2020 CLINICAL DATA:  Evaluate cardiomegaly and right base atelectasis. EXAM: PORTABLE CHEST 1 VIEW COMPARISON:  03/26/2020 FINDINGS: Lungs are adequately inflated with worsening left base/retrocardiac opacification which may be due to atelectasis or infection. No effusion. Stable cardiomegaly. Remainder of the exam is unchanged. IMPRESSION: 1. Worsening hazy airspace density over the left base/retrocardiac region which may be due to atelectasis or infection.  2.  Stable cardiomegaly. Electronically Signed   By: Marin Olp M.D.   On: 03/30/2020 15:11   DG Chest Portable 1 View  Result Date: 03/26/2020 CLINICAL DATA:  Altered mental status EXAM: PORTABLE CHEST 1 VIEW COMPARISON:  12/14/2019, 09/17/2017 FINDINGS: Cardiomegaly with mild central bronchovascular crowding. No pleural effusion or consolidation. Aortic atherosclerosis. Hazy atelectasis at the right base. IMPRESSION: Cardiomegaly. Hazy atelectasis at the right base. Electronically Signed   By: Donavan Foil M.D.   On: 03/26/2020 15:32   DG Foot Complete Right  Result Date: 03/29/2020 CLINICAL DATA:  Altered mental status, right foot swelling EXAM: RIGHT FOOT COMPLETE - 3+ VIEW COMPARISON:  None. FINDINGS: Normal alignment. No fracture or dislocation. Mild degenerative changes noted within the first MTP and midfoot. Moderate superior and plantar calcaneal spur. Vascular calcifications noted. IMPRESSION: No  acute fracture or dislocation. Electronically Signed   By: Fidela Salisbury MD   On: 03/29/2020 18:27     Subjective: No acute issues or events overnight, tolerating p.o. well, denies nausea, vomiting, diarrhea, constipation, headache, fevers, chills.   Discharge Exam: Vitals:   04/04/20 0312 04/04/20 0719  BP: (!) 182/91 (!) 183/112  Pulse: 72 70  Resp: 18 18  Temp: 98.5 F (36.9 C) 98 F (36.7 C)  SpO2: 95% 95%   Vitals:   04/03/20 2045 04/03/20 2352 04/04/20 0312 04/04/20 0719  BP: (!) 164/92 (!) 170/84 (!) 182/91 (!) 183/112  Pulse: 73 75 72 70  Resp: 18 19 18 18   Temp: 97.6 F (36.4 C) 98.1 F (36.7 C) 98.5 F (36.9 C) 98 F (36.7 C)  TempSrc: Oral  Oral Oral  SpO2: 94% 94% 95% 95%  Weight:        General: Pt is alert, awake, not in acute distress Cardiovascular: RRR, S1/S2 +, no rubs, no gallops Respiratory: CTA bilaterally, no wheezing, no rhonchi Abdominal: Soft, NT, ND, bowel sounds + Extremities: no edema, no cyanosis    The results of significant diagnostics from this hospitalization (including imaging, microbiology, ancillary and laboratory) are listed below for reference.     Microbiology: Recent Results (from the past 240 hour(s))  Urine culture     Status: Abnormal   Collection Time: 03/26/20  5:05 PM   Specimen: Urine, Catheterized  Result Value Ref Range Status   Specimen Description URINE, CATHETERIZED  Final   Special Requests   Final    NONE Performed at [facility], [address], [place], [place] [address]    Culture >=100,000 COLONIES/mL ENTEROCOCCUS FAECALIS (A)  Final   Report Status 03/28/2020 FINAL  Final   Organism ID, Bacteria ENTEROCOCCUS FAECALIS (A)  Final      Susceptibility   Enterococcus faecalis - MIC*    AMPICILLIN <=2 SENSITIVE Sensitive     NITROFURANTOIN <=16 SENSITIVE Sensitive     VANCOMYCIN 1 SENSITIVE Sensitive     * >=100,000 COLONIES/mL ENTEROCOCCUS FAECALIS  SARS Coronavirus 2 by RT PCR (hospital  order, performed in [place] hospital lab) Nasopharyngeal Nasopharyngeal Swab     Status: None   Collection Time: 03/26/20  7:54 PM   Specimen: Nasopharyngeal Swab  Result Value Ref Range Status   SARS Coronavirus 2 NEGATIVE NEGATIVE Final    Comment: (NOTE) SARS-CoV-2 target nucleic acids are NOT DETECTED.  The SARS-CoV-2 RNA is generally detectable in upper and lower respiratory specimens during the acute phase of infection. The lowest concentration of SARS-CoV-2 viral copies this assay can detect is 250 copies / mL. A  negative result does not preclude SARS-CoV-2 infection and should not be used as the sole basis for treatment or other patient management decisions.  A negative result may occur with improper specimen collection / handling, submission of specimen other than nasopharyngeal swab, presence of viral mutation(s) within the areas targeted by this assay, and inadequate number of viral copies (<250 copies / mL). A negative result must be combined with clinical observations, patient history, and epidemiological information.  Fact Sheet for Patients:   StrictlyIdeas.no  Fact Sheet for Healthcare Providers: BankingDealers.co.za  This test is not yet approved or  cleared by the [place] FDA and has been authorized for detection and/or diagnosis of SARS-CoV-2 by FDA under an Emergency Use Authorization (EUA).  This EUA will remain in effect (meaning this test can be used) for the duration of the COVID-19 declaration under Section 564(b)(1) of the Act, 21 U.S.C. section 360bbb-3(b)(1), unless the authorization is terminated or revoked sooner.  Performed at [facility], [place] [address], [place], [place] [address]   MRSA PCR Screening     Status: Abnormal   Collection Time: 03/27/20 12:37 AM   Specimen: Nasal Mucosa; Nasopharyngeal  Result Value Ref Range Status   MRSA by PCR POSITIVE (A) NEGATIVE Final    Comment:  CRITICAL RESULT CALLED TO, READ BACK BY AND VERIFIED WITH: RN Teodora Medici [id number] @0216  THANEY Performed at [facility] [address], [place], [place] [address]   Culture, blood (routine x 2)     Status: None (Preliminary result)   Collection Time: 03/30/20 12:28 PM   Specimen: BLOOD  Result Value Ref Range Status   Specimen Description BLOOD RIGHT ANTECUBITAL  Final   Special Requests   Final    BOTTLES DRAWN AEROBIC AND ANAEROBIC Blood Culture results may not be optimal due to an inadequate volume of blood received in culture bottles   Culture   Final    NO GROWTH 3 DAYS Performed at [facility], [place] [address], [place], [place] [address]    Report Status PENDING  Incomplete  Culture, blood (routine x 2)     Status: None (Preliminary result)   Collection Time: 03/30/20 12:33 PM   Specimen: BLOOD  Result Value Ref Range Status   Specimen Description BLOOD RIGHT ANTECUBITAL  Final   Special Requests   Final    BOTTLES DRAWN AEROBIC ONLY Blood Culture adequate volume   Culture   Final    NO GROWTH 3 DAYS Performed at [facility], [place] [address] [place], [place] [address]    Report Status PENDING  Incomplete  SARS CORONAVIRUS 2 (TAT 6-24 HRS) Nasopharyngeal Nasopharyngeal Swab     Status: None   Collection Time: 04/03/20  2:42 PM   Specimen: Nasopharyngeal Swab  Result Value Ref Range Status   SARS Coronavirus 2 NEGATIVE NEGATIVE Final    Comment: (NOTE) SARS-CoV-2 target nucleic acids are NOT DETECTED.  The SARS-CoV-2 RNA is generally detectable in upper and lower respiratory specimens during the acute phase of infection. Negative results do not preclude SARS-CoV-2 infection, do not rule out co-infections with other pathogens, and should not be used as the sole basis for treatment or other patient management decisions. Negative results must be combined with clinical observations, patient history, and epidemiological information. The  expected result is Negative.  Fact Sheet for Patients: SugarRoll.be  Fact Sheet for Healthcare Providers: https://www.woods-mathews.com/  This test is not yet approved or cleared by the [place]  FDA and  has been authorized for detection and/or diagnosis of SARS-CoV-2 by FDA under an Emergency Use Authorization (EUA). This EUA will remain  in effect (meaning this test can be used) for the duration of the COVID-19 declaration under Se ction 564(b)(1) of the Act, 21 U.S.C. section 360bbb-3(b)(1), unless the authorization is terminated or revoked sooner.  Performed at [facility], [place] [address], [place], [place] [address]      Labs: BNP (last 3 results) No results for input(s): BNP in the last 8760 hours. Basic Metabolic Panel: Recent Labs  Lab 03/29/20 1241 03/30/20 0212 03/31/20 0418 04/03/20 0337  NA 137 139 140 140  K 4.3 4.4 4.5 4.2  CL 102 101 104 104  CO2 26 28 30 27   GLUCOSE 186* 129* 120* 113*  BUN 21 26* 26* 19  CREATININE 1.31* 1.55* 1.37* 1.15*  CALCIUM 8.6* 8.7* 8.8* 8.8*   Liver Function Tests: No results for input(s): AST, ALT, ALKPHOS, BILITOT, PROT, ALBUMIN in the last 168 hours. No results for input(s): LIPASE, AMYLASE in the last 168 hours. No results for input(s): AMMONIA in the last 168 hours. CBC: Recent Labs  Lab 03/29/20 1241 03/30/20 0212 03/31/20 0418 04/03/20 0337  WBC 11.0* 13.0* 10.3 9.6  HGB 12.2 12.1 12.4 12.1  HCT 39.7 39.8 40.1 38.4  MCV 94.5 94.8 96.6 93.9  PLT 177 181 193 272   Cardiac Enzymes: No results for input(s): CKTOTAL, CKMB, CKMBINDEX, TROPONINI in the last 168 hours. BNP: Invalid input(s): POCBNP CBG: No results for input(s): GLUCAP in the last 168 hours. D-Dimer No results for input(s): DDIMER in the last 72 hours. Hgb A1c No results for input(s): HGBA1C in the last 72 hours. Lipid Profile No results for input(s): CHOL, HDL, LDLCALC, TRIG, CHOLHDL,  LDLDIRECT in the last 72 hours. Thyroid function studies No results for input(s): TSH, T4TOTAL, T3FREE, THYROIDAB in the last 72 hours.  Invalid input(s): FREET3 Anemia work up No results for input(s): VITAMINB12, FOLATE, FERRITIN, TIBC, IRON, RETICCTPCT in the last 72 hours. Urinalysis    Component Value Date/Time   COLORURINE YELLOW 03/26/2020 1705   APPEARANCEUR HAZY (A) 03/26/2020 1705   LABSPEC 1.019 03/26/2020 1705   PHURINE 5.0 03/26/2020 1705   GLUCOSEU NEGATIVE 03/26/2020 1705   GLUCOSEU NEGATIVE 06/12/2014 1410   HGBUR SMALL (A) 03/26/2020 1705   BILIRUBINUR NEGATIVE 03/26/2020 1705   BILIRUBINUR Neg 06/19/2016 1101   KETONESUR NEGATIVE 03/26/2020 1705   PROTEINUR 30 (A) 03/26/2020 1705   UROBILINOGEN >=8.0 06/19/2016 1101   UROBILINOGEN 0.2 05/31/2015 1835   NITRITE NEGATIVE 03/26/2020 1705   LEUKOCYTESUR TRACE (A) 03/26/2020 1705   Sepsis Labs Invalid input(s): PROCALCITONIN,  WBC,  LACTICIDVEN Microbiology Recent Results (from the past 240 hour(s))  Urine culture     Status: Abnormal   Collection Time: 03/26/20  5:05 PM   Specimen: Urine, Catheterized  Result Value Ref Range Status   Specimen Description URINE, CATHETERIZED  Final   Special Requests   Final    NONE Performed at [facility], [address] [place], [place] [address]    Culture >=100,000 COLONIES/mL ENTEROCOCCUS FAECALIS (A)  Final   Report Status 03/28/2020 FINAL  Final   Organism ID, Bacteria ENTEROCOCCUS FAECALIS (A)  Final      Susceptibility   Enterococcus faecalis - MIC*    AMPICILLIN <=2 SENSITIVE Sensitive     NITROFURANTOIN <=16 SENSITIVE Sensitive     VANCOMYCIN 1 SENSITIVE Sensitive     * >=  100,000 COLONIES/mL ENTEROCOCCUS FAECALIS  SARS Coronavirus 2 by RT PCR (hospital order, performed in [facility] hospital lab) Nasopharyngeal Nasopharyngeal Swab     Status: None   Collection Time: 03/26/20  7:54 PM   Specimen: Nasopharyngeal Swab  Result Value Ref Range Status    SARS Coronavirus 2 NEGATIVE NEGATIVE Final    Comment: (NOTE) SARS-CoV-2 target nucleic acids are NOT DETECTED.  The SARS-CoV-2 RNA is generally detectable in upper and lower respiratory specimens during the acute phase of infection. The lowest concentration of SARS-CoV-2 viral copies this assay can detect is 250 copies / mL. A negative result does not preclude SARS-CoV-2 infection and should not be used as the sole basis for treatment or other patient management decisions.  A negative result may occur with improper specimen collection / handling, submission of specimen other than nasopharyngeal swab, presence of viral mutation(s) within the areas targeted by this assay, and inadequate number of viral copies (<250 copies / mL). A negative result must be combined with clinical observations, patient history, and epidemiological information.  Fact Sheet for Patients:   StrictlyIdeas.no  Fact Sheet for Healthcare Providers: BankingDealers.co.za  This test is not yet approved or  cleared by the [place] FDA and has been authorized for detection and/or diagnosis of SARS-CoV-2 by FDA under an Emergency Use Authorization (EUA).  This EUA will remain in effect (meaning this test can be used) for the duration of the COVID-19 declaration under Section 564(b)(1) of the Act, 21 U.S.C. section 360bbb-3(b)(1), unless the authorization is terminated or revoked sooner.  Performed at [facility], [place] [address], [place], [place] [address]   MRSA PCR Screening     Status: Abnormal   Collection Time: 03/27/20 12:37 AM   Specimen: Nasal Mucosa; Nasopharyngeal  Result Value Ref Range Status   MRSA by PCR POSITIVE (A) NEGATIVE Final    Comment: CRITICAL RESULT CALLED TO, READ BACK BY AND VERIFIED WITH: RN Teodora Medici [id number] @0216  THANEY Performed at [facility] [address], [place], [place] [address]   Culture, blood  (routine x 2)     Status: None (Preliminary result)   Collection Time: 03/30/20 12:28 PM   Specimen: BLOOD  Result Value Ref Range Status   Specimen Description BLOOD RIGHT ANTECUBITAL  Final   Special Requests   Final    BOTTLES DRAWN AEROBIC AND ANAEROBIC Blood Culture results may not be optimal due to an inadequate volume of blood received in culture bottles   Culture   Final    NO GROWTH 3 DAYS Performed at [facility], [place] [address], [place], [place] [address]    Report Status PENDING  Incomplete  Culture, blood (routine x 2)     Status: None (Preliminary result)   Collection Time: 03/30/20 12:33 PM   Specimen: BLOOD  Result Value Ref Range Status   Specimen Description BLOOD RIGHT ANTECUBITAL  Final   Special Requests   Final    BOTTLES DRAWN AEROBIC ONLY Blood Culture adequate volume   Culture   Final    NO GROWTH 3 DAYS Performed at [facility], [place] [address], [place], [place] [address]    Report Status PENDING  Incomplete  SARS CORONAVIRUS 2 (TAT 6-24 HRS) Nasopharyngeal Nasopharyngeal Swab     Status: None   Collection Time: 04/03/20  2:42 PM   Specimen: Nasopharyngeal Swab  Result Value Ref Range Status   SARS Coronavirus 2 NEGATIVE NEGATIVE Final  Comment: (NOTE) SARS-CoV-2 target nucleic acids are NOT DETECTED.  The SARS-CoV-2 RNA is generally detectable in upper and lower respiratory specimens during the acute phase of infection. Negative results do not preclude SARS-CoV-2 infection, do not rule out co-infections with other pathogens, and should not be used as the sole basis for treatment or other patient management decisions. Negative results must be combined with clinical observations, patient history, and epidemiological information. The expected result is Negative.  Fact Sheet for Patients: SugarRoll.be  Fact Sheet for Healthcare Providers: https://www.woods-mathews.com/  This test is not yet  approved or cleared by the [place] FDA and  has been authorized for detection and/or diagnosis of SARS-CoV-2 by FDA under an Emergency Use Authorization (EUA). This EUA will remain  in effect (meaning this test can be used) for the duration of the COVID-19 declaration under Se ction 564(b)(1) of the Act, 21 U.S.C. section 360bbb-3(b)(1), unless the authorization is terminated or revoked sooner.  Performed at [facility], [place] [address], [place], [place] [address]      Time coordinating discharge: Over 30 minutes  SIGNED:   Little Ishikawa, DO [facility] 04/04/2020, 10:07 AM Pager   If 7PM-7AM, please contact night-coverage [email or web address]

## 2020-04-04 NOTE — TOC Transition Note (Signed)
Transition of Care [facility]) - CM/SW Discharge Note   Patient Details  Name: Dawn Morrow MRN: [id number] Date of Birth: 1936/07/16  Transition of Care [facility]) CM/SW Contact:  Joanne Chars, LCSW Phone Number: 04/04/2020, 11:07 AM   Clinical Narrative:   Call report to [phone].  Pt going to room 203.    Final next level of care: [place] ([facility]) Barriers to Discharge: Barriers Resolved   Patient Goals and CMS Choice Patient states their goals for this hospitalization and ongoing recovery are:: pt did not express goals CMS Medicare.gov Compare Post Acute Care list provided to:: Patient Choice offered to / list presented to : Patient  Discharge Placement              Patient chooses bed at: [facility] Patient to be transferred to facility by: [facility] Name of family member notified: Merrilee Jansky, niece Patient and family notified of of transfer: 04/04/20  Discharge Plan and Services                                     Social Determinants of Health (SDOH) Interventions     Readmission Risk Interventions Readmission Risk Prevention Plan 10/03/2019  Transportation Screening Complete  PCP or Specialist Appt within 3-5 Days Complete  HRI or [place] Not Complete  HRI or Home Care Consult comments Patient at her baseline  Social Work Consult for [place] Planning/Counseling [place] Not Applicable  Medication Review [employer]) Referral to Pharmacy  Some recent data might be hidden

## 2020-04-27 ENCOUNTER — Emergency Department (HOSPITAL_COMMUNITY): Payer: Medicare Other

## 2020-04-27 ENCOUNTER — Inpatient Hospital Stay (HOSPITAL_COMMUNITY)
Admission: EM | Admit: 2020-04-27 | Discharge: 2020-05-06 | DRG: 871 | Disposition: A | Discharge status: Skilled Nursing Facility | Payer: Medicare Other | Source: Skilled Nursing Facility | Attending: Internal Medicine | Admitting: Internal Medicine

## 2020-04-27 ENCOUNTER — Encounter (HOSPITAL_COMMUNITY): Payer: Self-pay | Admitting: Emergency Medicine

## 2020-04-27 ENCOUNTER — Other Ambulatory Visit: Payer: Self-pay

## 2020-04-27 DIAGNOSIS — Z882 Allergy status to sulfonamides status: Secondary | ICD-10-CM

## 2020-04-27 DIAGNOSIS — K219 Gastro-esophageal reflux disease without esophagitis: Secondary | ICD-10-CM | POA: Diagnosis present

## 2020-04-27 DIAGNOSIS — E8729 Other acidosis: Secondary | ICD-10-CM

## 2020-04-27 DIAGNOSIS — E039 Hypothyroidism, unspecified: Secondary | ICD-10-CM | POA: Diagnosis present

## 2020-04-27 DIAGNOSIS — Z981 Arthrodesis status: Secondary | ICD-10-CM

## 2020-04-27 DIAGNOSIS — J449 Chronic obstructive pulmonary disease, unspecified: Secondary | ICD-10-CM | POA: Diagnosis not present

## 2020-04-27 DIAGNOSIS — J440 Chronic obstructive pulmonary disease with acute lower respiratory infection: Secondary | ICD-10-CM | POA: Diagnosis present

## 2020-04-27 DIAGNOSIS — Z86718 Personal history of other venous thrombosis and embolism: Secondary | ICD-10-CM

## 2020-04-27 DIAGNOSIS — Z885 Allergy status to narcotic agent status: Secondary | ICD-10-CM

## 2020-04-27 DIAGNOSIS — I2699 Other pulmonary embolism without acute cor pulmonale: Secondary | ICD-10-CM | POA: Diagnosis present

## 2020-04-27 DIAGNOSIS — Z9981 Dependence on supplemental oxygen: Secondary | ICD-10-CM

## 2020-04-27 DIAGNOSIS — Z6835 Body mass index (BMI) 35.0-35.9, adult: Secondary | ICD-10-CM | POA: Diagnosis not present

## 2020-04-27 DIAGNOSIS — E669 Obesity, unspecified: Secondary | ICD-10-CM | POA: Diagnosis present

## 2020-04-27 DIAGNOSIS — Z833 Family history of diabetes mellitus: Secondary | ICD-10-CM

## 2020-04-27 DIAGNOSIS — F329 Major depressive disorder, single episode, unspecified: Secondary | ICD-10-CM | POA: Diagnosis present

## 2020-04-27 DIAGNOSIS — E785 Hyperlipidemia, unspecified: Secondary | ICD-10-CM | POA: Diagnosis present

## 2020-04-27 DIAGNOSIS — J9621 Acute and chronic respiratory failure with hypoxia: Secondary | ICD-10-CM | POA: Diagnosis present

## 2020-04-27 DIAGNOSIS — M8580 Other specified disorders of bone density and structure, unspecified site: Secondary | ICD-10-CM | POA: Diagnosis present

## 2020-04-27 DIAGNOSIS — A419 Sepsis, unspecified organism: Secondary | ICD-10-CM | POA: Diagnosis not present

## 2020-04-27 DIAGNOSIS — Z86711 Personal history of pulmonary embolism: Secondary | ICD-10-CM

## 2020-04-27 DIAGNOSIS — Z66 Do not resuscitate: Secondary | ICD-10-CM | POA: Diagnosis not present

## 2020-04-27 DIAGNOSIS — I482 Chronic atrial fibrillation, unspecified: Secondary | ICD-10-CM | POA: Diagnosis present

## 2020-04-27 DIAGNOSIS — Z1623 Resistance to quinolones and fluoroquinolones: Secondary | ICD-10-CM | POA: Diagnosis present

## 2020-04-27 DIAGNOSIS — N179 Acute kidney failure, unspecified: Secondary | ICD-10-CM | POA: Diagnosis present

## 2020-04-27 DIAGNOSIS — I1310 Hypertensive heart and chronic kidney disease without heart failure, with stage 1 through stage 4 chronic kidney disease, or unspecified chronic kidney disease: Secondary | ICD-10-CM | POA: Diagnosis present

## 2020-04-27 DIAGNOSIS — Z8673 Personal history of transient ischemic attack (TIA), and cerebral infarction without residual deficits: Secondary | ICD-10-CM | POA: Diagnosis not present

## 2020-04-27 DIAGNOSIS — E860 Dehydration: Secondary | ICD-10-CM | POA: Diagnosis present

## 2020-04-27 DIAGNOSIS — A4159 Other Gram-negative sepsis: Secondary | ICD-10-CM | POA: Diagnosis present

## 2020-04-27 DIAGNOSIS — N390 Urinary tract infection, site not specified: Secondary | ICD-10-CM | POA: Diagnosis present

## 2020-04-27 DIAGNOSIS — E1122 Type 2 diabetes mellitus with diabetic chronic kidney disease: Secondary | ICD-10-CM | POA: Diagnosis present

## 2020-04-27 DIAGNOSIS — Z20822 Contact with and (suspected) exposure to covid-19: Secondary | ICD-10-CM | POA: Diagnosis present

## 2020-04-27 DIAGNOSIS — Z79899 Other long term (current) drug therapy: Secondary | ICD-10-CM

## 2020-04-27 DIAGNOSIS — Z87440 Personal history of urinary (tract) infections: Secondary | ICD-10-CM

## 2020-04-27 DIAGNOSIS — I82409 Acute embolism and thrombosis of unspecified deep veins of unspecified lower extremity: Secondary | ICD-10-CM | POA: Diagnosis present

## 2020-04-27 DIAGNOSIS — Z751 Person awaiting admission to adequate facility elsewhere: Secondary | ICD-10-CM

## 2020-04-27 DIAGNOSIS — G9341 Metabolic encephalopathy: Secondary | ICD-10-CM | POA: Diagnosis present

## 2020-04-27 DIAGNOSIS — Z7901 Long term (current) use of anticoagulants: Secondary | ICD-10-CM

## 2020-04-27 DIAGNOSIS — B964 Proteus (mirabilis) (morganii) as the cause of diseases classified elsewhere: Secondary | ICD-10-CM | POA: Diagnosis present

## 2020-04-27 DIAGNOSIS — J9622 Acute and chronic respiratory failure with hypercapnia: Secondary | ICD-10-CM | POA: Diagnosis present

## 2020-04-27 DIAGNOSIS — Z7984 Long term (current) use of oral hypoglycemic drugs: Secondary | ICD-10-CM

## 2020-04-27 DIAGNOSIS — Z880 Allergy status to penicillin: Secondary | ICD-10-CM

## 2020-04-27 DIAGNOSIS — E1169 Type 2 diabetes mellitus with other specified complication: Secondary | ICD-10-CM | POA: Diagnosis present

## 2020-04-27 DIAGNOSIS — G5622 Lesion of ulnar nerve, left upper limb: Secondary | ICD-10-CM | POA: Diagnosis present

## 2020-04-27 DIAGNOSIS — R6520 Severe sepsis without septic shock: Secondary | ICD-10-CM | POA: Diagnosis present

## 2020-04-27 DIAGNOSIS — N183 Chronic kidney disease, stage 3 unspecified: Secondary | ICD-10-CM | POA: Diagnosis present

## 2020-04-27 DIAGNOSIS — N1832 Chronic kidney disease, stage 3b: Secondary | ICD-10-CM | POA: Diagnosis present

## 2020-04-27 DIAGNOSIS — Z79890 Hormone replacement therapy (postmenopausal): Secondary | ICD-10-CM

## 2020-04-27 DIAGNOSIS — E1142 Type 2 diabetes mellitus with diabetic polyneuropathy: Secondary | ICD-10-CM | POA: Diagnosis present

## 2020-04-27 DIAGNOSIS — Z87891 Personal history of nicotine dependence: Secondary | ICD-10-CM

## 2020-04-27 DIAGNOSIS — R4182 Altered mental status, unspecified: Secondary | ICD-10-CM

## 2020-04-27 DIAGNOSIS — E872 Acidosis: Secondary | ICD-10-CM

## 2020-04-27 DIAGNOSIS — F419 Anxiety disorder, unspecified: Secondary | ICD-10-CM | POA: Diagnosis present

## 2020-04-27 LAB — CBC WITH DIFFERENTIAL/PLATELET
Abs Immature Granulocytes: 0.09 10*3/uL — ABNORMAL HIGH (ref 0.00–0.07)
Basophils Absolute: 0 10*3/uL (ref 0.0–0.1)
Basophils Relative: 0 %
Eosinophils Absolute: 0.1 10*3/uL (ref 0.0–0.5)
Eosinophils Relative: 0 %
HCT: 42.6 % (ref 36.0–46.0)
Hemoglobin: 12.7 g/dL (ref 12.0–15.0)
Immature Granulocytes: 1 %
Lymphocytes Relative: 8 %
Lymphs Abs: 1.3 10*3/uL (ref 0.7–4.0)
MCH: 28.5 pg (ref 26.0–34.0)
MCHC: 29.8 g/dL — ABNORMAL LOW (ref 30.0–36.0)
MCV: 95.7 fL (ref 80.0–100.0)
Monocytes Absolute: 0.9 10*3/uL (ref 0.1–1.0)
Monocytes Relative: 5 %
Neutro Abs: 14.4 10*3/uL — ABNORMAL HIGH (ref 1.7–7.7)
Neutrophils Relative %: 86 %
Platelets: 180 10*3/uL (ref 150–400)
RBC: 4.45 MIL/uL (ref 3.87–5.11)
RDW: 17.1 % — ABNORMAL HIGH (ref 11.5–15.5)
WBC: 16.8 10*3/uL — ABNORMAL HIGH (ref 4.0–10.5)
nRBC: 0 % (ref 0.0–0.2)

## 2020-04-27 LAB — I-STAT VENOUS BLOOD GAS, ED
Acid-Base Excess: 8 mmol/L — ABNORMAL HIGH (ref 0.0–2.0)
Bicarbonate: 35.8 mmol/L — ABNORMAL HIGH (ref 20.0–28.0)
Calcium, Ion: 1.07 mmol/L — ABNORMAL LOW (ref 1.15–1.40)
HCT: 42 % (ref 36.0–46.0)
Hemoglobin: 14.3 g/dL (ref 12.0–15.0)
O2 Saturation: 100 %
Potassium: 5.2 mmol/L — ABNORMAL HIGH (ref 3.5–5.1)
Sodium: 142 mmol/L (ref 135–145)
TCO2: 38 mmol/L — ABNORMAL HIGH (ref 22–32)
pCO2, Ven: 62.3 mmHg — ABNORMAL HIGH (ref 44.0–60.0)
pH, Ven: 7.368 (ref 7.250–7.430)
pO2, Ven: 183 mmHg — ABNORMAL HIGH (ref 32.0–45.0)

## 2020-04-27 LAB — URINALYSIS, ROUTINE W REFLEX MICROSCOPIC
Bilirubin Urine: NEGATIVE
Glucose, UA: NEGATIVE mg/dL
Hgb urine dipstick: NEGATIVE
Ketones, ur: NEGATIVE mg/dL
Nitrite: NEGATIVE
Protein, ur: 100 mg/dL — AB
Specific Gravity, Urine: 1.018 (ref 1.005–1.030)
WBC, UA: >50 WBC/hpf — ABNORMAL HIGH (ref 0–5)
pH: 8 (ref 5.0–8.0)

## 2020-04-27 LAB — SARS CORONAVIRUS 2 BY RT PCR (HOSPITAL ORDER, PERFORMED IN ~~LOC~~ HOSPITAL LAB): SARS Coronavirus 2: NEGATIVE

## 2020-04-27 LAB — CBG MONITORING, ED
Glucose-Capillary: 100 mg/dL — ABNORMAL HIGH (ref 70–99)
Glucose-Capillary: 107 mg/dL — ABNORMAL HIGH (ref 70–99)
Glucose-Capillary: 65 mg/dL — ABNORMAL LOW (ref 70–99)

## 2020-04-27 LAB — I-STAT CHEM 8, ED
BUN: 45 mg/dL — ABNORMAL HIGH (ref 8–23)
Calcium, Ion: 1.05 mmol/L — ABNORMAL LOW (ref 1.15–1.40)
Chloride: 101 mmol/L (ref 98–111)
Creatinine, Ser: 1.8 mg/dL — ABNORMAL HIGH (ref 0.44–1.00)
Glucose, Bld: 106 mg/dL — ABNORMAL HIGH (ref 70–99)
HCT: 42 % (ref 36.0–46.0)
Hemoglobin: 14.3 g/dL (ref 12.0–15.0)
Potassium: 5.3 mmol/L — ABNORMAL HIGH (ref 3.5–5.1)
Sodium: 141 mmol/L (ref 135–145)
TCO2: 31 mmol/L (ref 22–32)

## 2020-04-27 LAB — COMPREHENSIVE METABOLIC PANEL
ALT: 21 U/L (ref 0–44)
AST: 19 U/L (ref 15–41)
Albumin: 3.1 g/dL — ABNORMAL LOW (ref 3.5–5.0)
Alkaline Phosphatase: 70 U/L (ref 38–126)
Anion gap: 13 (ref 5–15)
BUN: 31 mg/dL — ABNORMAL HIGH (ref 8–23)
CO2: 28 mmol/L (ref 22–32)
Calcium: 8.6 mg/dL — ABNORMAL LOW (ref 8.9–10.3)
Chloride: 100 mmol/L (ref 98–111)
Creatinine, Ser: 1.9 mg/dL — ABNORMAL HIGH (ref 0.44–1.00)
GFR calc Af Amer: 28 mL/min — ABNORMAL LOW (ref >60)
GFR calc non Af Amer: 24 mL/min — ABNORMAL LOW (ref >60)
Glucose, Bld: 108 mg/dL — ABNORMAL HIGH (ref 70–99)
Potassium: 4.9 mmol/L (ref 3.5–5.1)
Sodium: 141 mmol/L (ref 135–145)
Total Bilirubin: 1 mg/dL (ref 0.3–1.2)
Total Protein: 6.6 g/dL (ref 6.5–8.1)

## 2020-04-27 LAB — I-STAT ARTERIAL BLOOD GAS, ED
Acid-Base Excess: 5 mmol/L — ABNORMAL HIGH (ref 0.0–2.0)
Bicarbonate: 31 mmol/L — ABNORMAL HIGH (ref 20.0–28.0)
Calcium, Ion: 1.14 mmol/L — ABNORMAL LOW (ref 1.15–1.40)
HCT: 37 % (ref 36.0–46.0)
Hemoglobin: 12.6 g/dL (ref 12.0–15.0)
O2 Saturation: 98 %
Potassium: 4.6 mmol/L (ref 3.5–5.1)
Sodium: 144 mmol/L (ref 135–145)
TCO2: 33 mmol/L — ABNORMAL HIGH (ref 22–32)
pCO2 arterial: 52.1 mmHg — ABNORMAL HIGH (ref 32.0–48.0)
pH, Arterial: 7.383 (ref 7.350–7.450)
pO2, Arterial: 106 mmHg (ref 83.0–108.0)

## 2020-04-27 LAB — STREP PNEUMONIAE URINARY ANTIGEN: Strep Pneumo Urinary Antigen: NEGATIVE

## 2020-04-27 LAB — PROTIME-INR
INR: 1.9 — ABNORMAL HIGH (ref 0.8–1.2)
Prothrombin Time: 21.1 seconds — ABNORMAL HIGH (ref 11.4–15.2)

## 2020-04-27 LAB — LACTIC ACID, PLASMA
Lactic Acid, Venous: 1.4 mmol/L (ref 0.5–1.9)
Lactic Acid, Venous: 1.2 mmol/L (ref 0.5–1.9)

## 2020-04-27 LAB — CK: Total CK: 42 U/L (ref 38–234)

## 2020-04-27 LAB — APTT: aPTT: 38 seconds — ABNORMAL HIGH (ref 24–36)

## 2020-04-27 MED ORDER — RIVAROXABAN 20 MG PO TABS
20.0000 mg | ORAL_TABLET | Freq: Every evening | ORAL | Status: DC
  Administered 2020-04-27 – 2020-05-05 (×9): 20 mg via ORAL
  Filled 2020-04-27 (×11): qty 1

## 2020-04-27 MED ORDER — PROPYLENE GLYCOL 0.6 % OP SOLN: 1.0000 [drp] | Freq: Four times a day (QID) | OPHTHALMIC | Status: DC | PRN

## 2020-04-27 MED ORDER — LORAZEPAM 2 MG/ML IJ SOLN
0.5000 mg | Freq: Once | INTRAMUSCULAR | Status: AC
  Administered 2020-04-27: 0.5 mg via INTRAVENOUS
  Filled 2020-04-27: qty 1

## 2020-04-27 MED ORDER — LEVOTHYROXINE SODIUM 88 MCG PO TABS
88.0000 ug | ORAL_TABLET | Freq: Every day | ORAL | Status: DC
  Administered 2020-04-28 – 2020-05-06 (×9): 88 ug via ORAL
  Filled 2020-04-27 (×9): qty 1

## 2020-04-27 MED ORDER — SODIUM CHLORIDE 0.9 % IV SOLN: 2.0000 g | Freq: Once | INTRAVENOUS | Status: DC

## 2020-04-27 MED ORDER — SODIUM CHLORIDE 0.9 % IV SOLN
500.0000 mg | INTRAVENOUS | Status: DC
  Administered 2020-04-27 – 2020-04-30 (×4): 500 mg via INTRAVENOUS
  Filled 2020-04-27 (×5): qty 500

## 2020-04-27 MED ORDER — ALBUTEROL SULFATE HFA 108 (90 BASE) MCG/ACT IN AERS
2.0000 | INHALATION_SPRAY | Freq: Four times a day (QID) | RESPIRATORY_TRACT | Status: DC | PRN
  Administered 2020-04-27: 2 via RESPIRATORY_TRACT
  Filled 2020-04-27: qty 6.7

## 2020-04-27 MED ORDER — DOCUSATE SODIUM 100 MG PO CAPS
100.0000 mg | ORAL_CAPSULE | Freq: Every day | ORAL | Status: DC
  Administered 2020-04-27 – 2020-05-06 (×10): 100 mg via ORAL
  Filled 2020-04-27 (×10): qty 1

## 2020-04-27 MED ORDER — VANCOMYCIN HCL 2000 MG/400ML IV SOLN
2000.0000 mg | Freq: Once | INTRAVENOUS | Status: AC
  Administered 2020-04-27: 2000 mg via INTRAVENOUS
  Filled 2020-04-27: qty 400

## 2020-04-27 MED ORDER — EZETIMIBE 10 MG PO TABS
10.0000 mg | ORAL_TABLET | Freq: Every day | ORAL | Status: DC
  Administered 2020-04-28 – 2020-05-06 (×9): 10 mg via ORAL
  Filled 2020-04-27 (×9): qty 1

## 2020-04-27 MED ORDER — ACETAMINOPHEN 650 MG RE SUPP: 650.0000 mg | Freq: Four times a day (QID) | RECTAL | Status: DC | PRN

## 2020-04-27 MED ORDER — GABAPENTIN 300 MG PO CAPS: 300.0000 mg | ORAL_CAPSULE | ORAL | Status: DC

## 2020-04-27 MED ORDER — ATORVASTATIN CALCIUM 10 MG PO TABS
10.0000 mg | ORAL_TABLET | Freq: Every day | ORAL | Status: DC
  Administered 2020-04-27 – 2020-05-05 (×9): 10 mg via ORAL
  Filled 2020-04-27 (×9): qty 1

## 2020-04-27 MED ORDER — ONDANSETRON HCL 4 MG PO TABS: 4.0000 mg | ORAL_TABLET | Freq: Four times a day (QID) | ORAL | Status: DC | PRN

## 2020-04-27 MED ORDER — SODIUM CHLORIDE 0.9 % IV BOLUS
1000.0000 mL | Freq: Once | INTRAVENOUS | Status: AC
  Administered 2020-04-27: 1000 mL via INTRAVENOUS

## 2020-04-27 MED ORDER — POLYVINYL ALCOHOL 1.4 % OP SOLN
1.0000 [drp] | Freq: Four times a day (QID) | OPHTHALMIC | Status: DC | PRN
  Filled 2020-04-27: qty 15

## 2020-04-27 MED ORDER — INSULIN GLARGINE 100 UNIT/ML ~~LOC~~ SOLN
7.0000 [IU] | Freq: Every day | SUBCUTANEOUS | Status: DC
  Administered 2020-04-27 – 2020-05-05 (×8): 7 [IU] via SUBCUTANEOUS
  Filled 2020-04-27 (×10): qty 0.07

## 2020-04-27 MED ORDER — INSULIN ASPART 100 UNIT/ML ~~LOC~~ SOLN: 0.0000 [IU] | Freq: Every day | SUBCUTANEOUS | Status: DC

## 2020-04-27 MED ORDER — ONDANSETRON HCL 4 MG/2ML IJ SOLN: 4.0000 mg | Freq: Four times a day (QID) | INTRAMUSCULAR | Status: DC | PRN

## 2020-04-27 MED ORDER — ACETAMINOPHEN 325 MG PO TABS
650.0000 mg | ORAL_TABLET | Freq: Four times a day (QID) | ORAL | Status: DC | PRN
  Administered 2020-04-30 – 2020-05-06 (×2): 650 mg via ORAL
  Filled 2020-04-27 (×2): qty 2

## 2020-04-27 MED ORDER — INSULIN ASPART 100 UNIT/ML ~~LOC~~ SOLN
0.0000 [IU] | Freq: Three times a day (TID) | SUBCUTANEOUS | Status: DC
  Administered 2020-04-29: 2 [IU] via SUBCUTANEOUS
  Administered 2020-05-01 – 2020-05-06 (×5): 1 [IU] via SUBCUTANEOUS

## 2020-04-27 MED ORDER — SODIUM CHLORIDE 0.9 % IV SOLN: INTRAVENOUS | Status: DC

## 2020-04-27 MED ORDER — SERTRALINE HCL 50 MG PO TABS
75.0000 mg | ORAL_TABLET | Freq: Every day | ORAL | Status: DC
  Administered 2020-04-28 – 2020-05-06 (×9): 75 mg via ORAL
  Filled 2020-04-27 (×9): qty 1

## 2020-04-27 MED ORDER — POLYETHYLENE GLYCOL 3350 17 G PO PACK: 17.0000 g | PACK | Freq: Every day | ORAL | Status: DC | PRN

## 2020-04-27 MED ORDER — BUPROPION HCL ER (XL) 150 MG PO TB24
450.0000 mg | ORAL_TABLET | Freq: Every day | ORAL | Status: DC
  Administered 2020-04-28 – 2020-05-06 (×9): 450 mg via ORAL
  Filled 2020-04-27 (×9): qty 3

## 2020-04-27 MED ORDER — LORAZEPAM 0.5 MG PO TABS
0.5000 mg | ORAL_TABLET | Freq: Three times a day (TID) | ORAL | Status: DC | PRN
  Administered 2020-04-28: 0.5 mg via ORAL
  Filled 2020-04-27: qty 1

## 2020-04-27 MED ORDER — AEROCHAMBER PLUS FLO-VU LARGE MISC
Status: AC
  Administered 2020-04-27: 1
  Filled 2020-04-27: qty 1

## 2020-04-27 MED ORDER — DEXTROSE 5 % IV SOLN
0.5000 g | Freq: Three times a day (TID) | INTRAVENOUS | Status: AC
  Administered 2020-04-28 – 2020-05-03 (×17): 0.5 g via INTRAVENOUS
  Filled 2020-04-27 (×19): qty 0.5

## 2020-04-27 MED ORDER — MECLIZINE HCL 25 MG PO TABS
25.0000 mg | ORAL_TABLET | Freq: Every evening | ORAL | Status: DC | PRN
  Filled 2020-04-27 (×2): qty 1

## 2020-04-27 MED ORDER — SODIUM CHLORIDE 0.9 % IV SOLN
2.0000 g | Freq: Once | INTRAVENOUS | Status: AC
  Administered 2020-04-27: 2 g via INTRAVENOUS
  Filled 2020-04-27: qty 2

## 2020-04-27 NOTE — ED Provider Notes (Signed)
[facility] EMERGENCY DEPARTMENT Provider Note   CSN: [id number] Arrival date & time: 04/27/20  1238     History Chief Complaint  Patient presents with  . Altered Mental Status    Dawn Morrow is a 84 y.o. female.  Dawn Morrow is a 84 y.o. female with a history of COPD on chronic 3 L O2, PE, stroke, hypertension, hyperlipidemia, hypothyroidism, GERD, who presents from [place] assisted living facility via EMS for evaluation of altered mental status.  Patient's niece who makes the majority of her own medical decisions, went to visit her this morning and noted that she was laying flat in the bed, not responding, when she sat her up patient became more talkative but was very confused and not acting like herself and appeared ill.  Patient recently transferred to [facility] from [facility] skilled nursing facility after recent hospitalization at the end of July for altered mental status and UTI, patient reportedly had a second UTI while staying at [facility] as well.  Patient is unable to provide much history, denies any current pain and is not able to identify specific symptoms.  She is alert and oriented to person and place but not time or situation.  Her niece is at the bedside who states that she is typically alert and oriented able to get around and perform simple tasks sometimes requiring assistance.  EMS also noted that she had some increased work of breathing with diffuse rhonchi noted on exam.        Past Medical History:  Diagnosis Date  . Anxiety and depression   . Asthma   . Cataract   . COPD (chronic obstructive pulmonary disease) ([place])   . Diabetes mellitus   . Diverticulosis   . DVT (deep vein thrombosis) in pregnancy    hx x multiple on coumadin  . GERD (gastroesophageal reflux disease)   . Hemorrhoids   . Hiatal hernia   . Hyperlipidemia   . Hypertension   . Hypothyroidism   . On home oxygen therapy    "2L; 24/7" (09/17/2017)  .  Osteoarthritis    h/o spinal stenosis by MRI 2009  . Osteopenia   . Pulmonary embolism (HCC)    x 2  . Stroke [facility])    MINI  . Tubular adenoma of colon 07/2005    Patient Active Problem List   Diagnosis Date Noted  . AMS (altered mental status) 03/26/2020  . History of pulmonary embolism 03/26/2020  . History of DVT (deep vein thrombosis) 03/26/2020  . Atrial fibrillation, chronic ([place]) 03/26/2020  . Sepsis ([place]) 12/14/2019  . Acute encephalopathy 12/14/2019  . COVID-19 virus infection 10/02/2019  . Acute lower UTI 10/02/2019  . Acute kidney failure ([place]) 10/01/2019  . Syncope 09/18/2017  . Near syncope 09/17/2017  . Diabetes mellitus type 2 in obese ([place]) 09/17/2017  . Atrial fibrillation with RVR ([place]) 09/17/2017  . Hypomagnesemia 09/17/2017  . CKD (chronic kidney disease), stage III 09/17/2017  . Forehead contusion   . MGUS (monoclonal gammopathy of unknown significance) 08/13/2016  . Renal failure 07/09/2015  . Follow-up ---------PCP NOTES 05/17/2015  . Essential tremor 12/28/2014  . Ulnar neuropathy of left upper extremity 12/28/2014  . Intractable pain 06/21/2014  . Back pain 06/21/2014  . Lumbar radiculopathy 06/21/2014  . Encounter for therapeutic drug monitoring 03/14/2014  . Numbness 10/18/2013  . TIA (transient ischemic attack) 09/19/2013  . Weakness 09/18/2013  . Annual physical exam 05/30/2012  . Tremor 01/21/2012  . Failure  to thrive and poor med compliance  01/21/2012  . PE (pulmonary embolism) 11/18/2010  . DVT (deep venous thrombosis) ([place]) 11/18/2010  . Long term current use of anticoagulant 11/18/2010  . ABNORMAL ELECTROCARDIOGRAM 11/10/2010  . OSTEOARTHRITIS 08/06/2010  . DIZZINESS 04/15/2010  . Diabetes ([place]) 06/04/2009  . ACNE ROSACEA 11/28/2008  . BACK PAIN 05/16/2008  . HIP PAIN, RIGHT, CHRONIC 09/15/2007  . Hypothyroidism 09/13/2007  . Dyslipidemia 09/13/2007  . Osteoporosis 07/22/2007  . DEPRESSION 09/11/2006  . HTN (hypertension)  09/11/2006  . ASTHMA 09/11/2006  . COPD (chronic obstructive pulmonary disease) ([place]) 09/11/2006  . GERD 09/11/2006  . Recurrent UTI 09/11/2006  . [place]EN[place] FILTER INSERTION, HX OF 09/11/2006    Past Surgical History:  Procedure Laterality Date  . APPENDECTOMY    . CATARACT EXTRACTION    . CHOLECYSTECTOMY    . LUMBAR FUSION    . POLYPECTOMY    . TONSILLECTOMY    . TUBAL LIGATION       OB History   No obstetric history on file.     Family History  Adopted: Yes  Problem Relation Age of Onset  . Cancer Mother        ? colon or ovarian  . Diabetes Mother   . Cancer Brother        ?  . CAD Neg Hx     Social History   Tobacco Use  . Smoking status: Former Smoker    Types: Cigarettes  . Smokeless tobacco: Never Used  Vaping Use  . Vaping Use: Never used  Substance Use Topics  . Alcohol use: No    Comment: socially  . Drug use: No    Home Medications Prior to Admission medications   Medication Sig Start Date End Date Taking? Authorizing Provider  acetaminophen (TYLENOL) 500 MG tablet Take 500 mg by mouth every 4 (four) hours as needed for mild pain or headache.     [provider]  albuterol (PROVENTIL HFA;VENTOLIN HFA) 108 (90 BASE) MCG/ACT inhaler Inhale 2 puffs into the lungs every 6 (six) hours as needed for wheezing or shortness of breath. 08/05/15   Saguier, Percell Miller, PA-C  atorvastatin (LIPITOR) 10 MG tablet Take 1 tablet (10 mg total) by mouth at bedtime. 10/21/15   Colon Branch, MD  buPROPion HCl ER, XL, 450 MG TB24 Take 1 tablet by mouth daily.  11/20/19   [provider]  carvedilol (COREG) 6.25 MG tablet Take 1 tablet (6.25 mg total) by mouth 2 (two) times daily with a meal. 12/18/19 03/27/20  Antonieta Pert, MD  docusate sodium (COLACE) 100 MG capsule Take 100 mg by mouth daily.    [provider]  ezetimibe (ZETIA) 10 MG tablet Take 10 mg by mouth daily.    [provider]  gabapentin (NEURONTIN) 300 MG capsule Take 1 cap in  AM, 1 cap at noon, 2 caps at bedtime Patient taking differently: Take 300-600 mg by mouth See admin instructions. Take 1 capsule every morning and afternoon then take 2 capsules at bedtime. 12/21/14   Cameron Sprang, MD  ketoconazole (NIZORAL) 2 % shampoo Apply 1 application topically 2 (two) times a week. [place]    [provider]  levothyroxine (SYNTHROID) 88 MCG tablet Take 88 mcg by mouth daily before breakfast.    [provider]  linagliptin (TRADJENTA) 5 MG TABS tablet Take 1 tablet (5 mg total) by mouth at bedtime. 07/15/16   Colon Branch, MD  LORazepam (ATIVAN) 0.5  MG tablet Take 1 tablet (0.5 mg total) by mouth every 8 (eight) hours as needed for anxiety. 04/04/20   Little Ishikawa, MD  meclizine (ANTIVERT) 25 MG tablet Take 25 mg by mouth at bedtime as needed for dizziness.    [provider]  ondansetron (ZOFRAN) 4 MG tablet Take 4 mg by mouth every 8 (eight) hours as needed for nausea or vomiting.    [provider]  polyethylene glycol (MIRALAX / GLYCOLAX) packet Take 17 g by mouth daily as needed for mild constipation. Reported on 10/09/2015    [provider]  Propylene Glycol (SYSTANE BALANCE) 0.6 % SOLN Place 1 drop into both eyes 4 (four) times daily as needed (for dry eyes).     [provider]  repaglinide (PRANDIN) 0.5 MG tablet Take 1 tablet (0.5 mg total) by mouth 3 (three) times daily before meals. 01/14/16   Colon Branch, MD  rivaroxaban (XARELTO) 20 MG TABS tablet Take 1 tablet (20 mg total) by mouth daily with supper. 09/14/14   Colon Branch, MD  sertraline (ZOLOFT) 50 MG tablet Take 1.5 tablets by mouth daily at 12 noon. 11/20/19   [provider]    Allergies    Penicillins, Codeine, and Sulfonamide derivatives  Review of Systems   Review of Systems  Unable to perform ROS: Mental status change    Physical Exam Updated Vital Signs BP 102/64   Pulse 87   Temp 99.9 F (37.7 C) (Rectal)   Resp 19   SpO2  97%   Physical Exam Vitals and nursing note reviewed.  Constitutional:      General: She is not in acute distress.    Appearance: She is well-developed. She is ill-appearing. She is not diaphoretic.     Comments: Elderly female, alert but confused and very ill-appearing  HENT:     Head: Normocephalic and atraumatic.     Mouth/Throat:     Comments: Mucous membranes very dry, lips dry and cracked, oropharynx clear Eyes:     General:        Right eye: No discharge.        Left eye: No discharge.     Pupils: Pupils are equal, round, and reactive to light.  Cardiovascular:     Rate and Rhythm: Normal rate. Rhythm irregular.     Heart sounds: Normal heart sounds.     Comments: Normal rate but irregularly irregular rhythm Pulmonary:     Effort: Pulmonary effort is normal. No respiratory distress.     Breath sounds: Rhonchi present. No wheezing or rales.     Comments: On 3 L nasal cannula patient mildly tachypneic without significantly increased work of breathing, on exam patient has coarse rhonchi bilaterally and some decreased air movement Abdominal:     General: Bowel sounds are normal. There is no distension.     Palpations: Abdomen is soft. There is no mass.     Tenderness: There is no abdominal tenderness. There is no guarding.     Comments: Abdomen soft, nondistended, nontender to palpation in all quadrants without guarding or peritoneal signs  Musculoskeletal:        General: No deformity.     Cervical back: Neck supple.  Skin:    General: Skin is warm and dry.     Capillary Refill: Capillary refill takes less than 2 seconds.  Neurological:     Mental Status: She is alert.     Coordination: Coordination normal.  Comments: Speech is somewhat muffled, she is alert and oriented to person, and place but not time, able to follow commands Moves extremities without ataxia, coordination intact   Psychiatric:        Behavior: Behavior normal.     ED Results / Procedures /  Treatments   Labs (all labs ordered are listed, but only abnormal results are displayed) Labs Reviewed  COMPREHENSIVE METABOLIC PANEL - Abnormal; Notable for the following components:      Result Value   Glucose, Bld 108 (*)    BUN 31 (*)    Creatinine, Ser 1.90 (*)    Calcium 8.6 (*)    Albumin 3.1 (*)    GFR calc non Af Amer 24 (*)    GFR calc Af Amer 28 (*)    All other components within normal limits  CBC WITH DIFFERENTIAL/PLATELET - Abnormal; Notable for the following components:   WBC 16.8 (*)    MCHC 29.8 (*)    RDW 17.1 (*)    Neutro Abs 14.4 (*)    Abs Immature Granulocytes 0.09 (*)    All other components within normal limits  URINALYSIS, ROUTINE W REFLEX MICROSCOPIC - Abnormal; Notable for the following components:   Color, Urine AMBER (*)    APPearance CLOUDY (*)    Protein, ur 100 (*)    Leukocytes,Ua LARGE (*)    WBC, UA >50 (*)    Bacteria, UA RARE (*)    All other components within normal limits  PROTIME-INR - Abnormal; Notable for the following components:   Prothrombin Time 21.1 (*)    INR 1.9 (*)    All other components within normal limits  APTT - Abnormal; Notable for the following components:   aPTT 38 (*)    All other components within normal limits  I-STAT CHEM 8, ED - Abnormal; Notable for the following components:   Potassium 5.3 (*)    BUN 45 (*)    Creatinine, Ser 1.80 (*)    Glucose, Bld 106 (*)    Calcium, Ion 1.05 (*)    All other components within normal limits  CBG MONITORING, ED - Abnormal; Notable for the following components:   Glucose-Capillary 100 (*)    All other components within normal limits  I-STAT VENOUS BLOOD GAS, ED - Abnormal; Notable for the following components:   pCO2, Ven 62.3 (*)    pO2, Ven 183.0 (*)    Bicarbonate 35.8 (*)    TCO2 38 (*)    Acid-Base Excess 8.0 (*)    Potassium 5.2 (*)    Calcium, Ion 1.07 (*)    All other components within normal limits  SARS CORONAVIRUS 2 BY RT PCR (HOSPITAL ORDER,  [facility])  URINE CULTURE  CULTURE, BLOOD (ROUTINE X 2)  CULTURE, BLOOD (ROUTINE X 2)  LACTIC ACID, PLASMA  CK  LACTIC ACID, PLASMA    EKG EKG Interpretation  Date/Time:  Saturday April 27 2020 12:46:57 EDT Ventricular Rate:  103 PR Interval:    QRS Duration: 90 QT Interval:  354 QTC Calculation: 464 R Axis:   -33 Text Interpretation: Atrial fibrillation Left axis deviation Anterior infarct, old When compared to prior, similar afib. No STEMI Confirmed by Antony Blackbird [phone]) on 04/27/2020 1:16:13 PM   Radiology CT Head Wo Contrast  Result Date: 04/27/2020 CLINICAL DATA:  84 year old female with altered mental status and confusion. EXAM: CT HEAD WITHOUT CONTRAST TECHNIQUE: Contiguous axial images were obtained from the base of the  skull through the vertex without intravenous contrast. COMPARISON:  03/26/2020 CT and prior studies FINDINGS: Brain: No evidence of acute infarction, hemorrhage, hydrocephalus, extra-axial collection or mass lesion/mass effect. Atrophy, chronic small-vessel white matter ischemic changes and remote bilateral basal ganglia infarcts are again noted. Vascular: Carotid atherosclerotic calcifications are noted. Skull: Normal. Negative for fracture or focal lesion. Sinuses/Orbits: No acute finding. Other: None. IMPRESSION: 1. No evidence of acute intracranial abnormality. 2. Atrophy, chronic small-vessel white matter ischemic changes and remote bilateral basal ganglia infarcts. Electronically Signed   By: Margarette Canada M.D.   On: 04/27/2020 14:50   DG Chest Port 1 View  Result Date: 04/27/2020 CLINICAL DATA:  New onset confusion EXAM: PORTABLE CHEST 1 VIEW COMPARISON:  03/31/2019 FINDINGS: Chronic cardiomegaly. Extensive artifact from EKG leads. Low volume chest with interstitial coarsening, chronic. No edema, effusion, or pneumothorax. IMPRESSION: 1. Limited portable chest. 2. Cardiomegaly without failure. 3. Increased retrocardiac markings  but improved from 03/30/2020 chest x-ray Electronically Signed   By: Monte Fantasia M.D.   On: 04/27/2020 14:03    Procedures .Critical Care Performed by: Jacqlyn Larsen, PA-C Authorized by: Jacqlyn Larsen, PA-C   Critical care provider statement:    Critical care time (minutes):  45   Critical care was necessary to treat or prevent imminent or life-threatening deterioration of the following conditions:  Sepsis and respiratory failure (Hypercapnic respiratory failure and sepsis due to UTI)   Critical care was time spent personally by me on the following activities:  Discussions with consultants, evaluation of patient's response to treatment, examination of patient, ordering and performing treatments and interventions, ordering and review of laboratory studies, ordering and review of radiographic studies, pulse oximetry, re-evaluation of patient's condition, obtaining history from patient or surrogate and review of old charts   (including critical care time)  Medications Ordered in ED Medications  aztreonam (AZACTAM) 2 g in sodium chloride 0.9 % 100 mL IVPB (has no administration in time range)  vancomycin (VANCOREADY) IVPB 2000 mg/400 mL (has no administration in time range)  sodium chloride 0.9 % bolus 1,000 mL (1,000 mLs Intravenous New Bag/Given 04/27/20 1330)    ED Course  I have reviewed the triage vital signs and the nursing notes.  Pertinent labs & imaging results that were available during my care of the patient were reviewed by me and considered in my medical decision making (see chart for details).    MDM Rules/Calculators/A&P                          84 year old female arrives via EMS from assisted living facility with altered mental status.  Niece went to check on her and found her laying flat in bed, unresponsive after sitting her back in her was confused and is not liking any sense.  Patient had recent admission for AMS and UTI.  Had another UTI when she was in a SNF, was  just moved to the assisted living facility on Tuesday, unknown last well time.  On arrival she is not febrile via rectal temp, she is in A. fib but not in RVR, blood pressures stable, a few borderline low, patient is on oxygen and has coarse rhonchi in bilateral lung fields.  Will initiate broad work-up for altered mental status including basic labs, lactic acid, blood cultures, CK, elevated culture, VBG, chest x-ray and head CT.  I have independently ordered, reviewed and interpreted all labs and imaging: CBC: Leukocytosis of 16.8, stable hemoglobin CMP:  Creatinine slightly increased at 1.9, No significant electrolyte derangements, normal liver function UA: Many WBCs, large leukocytes, rare bacteria present Lactic acid: Not elevated at 1.4 CK: Normal Covid: Negative VBG: Significant CO2 retention with PCO2 of 62.3, will initiate BiPAP  Chest x-ray: Limited portable chest, cardiomegaly without failure pulmonary edema, there is some increased retrocardiac markings, in the setting of patient's rhonchi and oxygen requirement question potential infection.  Head CT: No acute abnormality  Patient states that at bedside she is primary medical contact for the patient and I updated her on patient's care, patient will require admission.  Patient with multiple recent UTIs, discussed antibiotics with pharmacist Valorie Roosevelt who reviewed previous cultures and recommends IV vancomycin and IV aztreonam given penicillin allergy and no prior history of cephalosporin intolerance.  Patient is afebrile, she has not been tachycardic, has had some borderline low blood pressures, but lactic acid is not elevated at 1.4.  While patient has a UTI she does not appear to be in severe sepsis or septic shock, patient given 1 L IV fluids but will hold off on giving 30 cc/kg fluid bolus.  Patient with CO2 retention, PCO2 of 62 on VBG, this is likely contributing significantly to patient's altered mental status  Case  discussed with Dr. Ree Kida with [facility] hospitalist who will see and admit the patient.  Final Clinical Impression(s) / ED Diagnoses Final diagnoses:  Altered mental status, unspecified altered mental status type    Rx / DC Orders ED Discharge Orders    None       Janet Berlin 04/28/20 1527    Tegeler, Gwenyth Allegra, MD 04/30/20 [phone]

## 2020-04-27 NOTE — Progress Notes (Signed)
Pharmacy Antibiotic Note  Dawn Morrow is a 84 y.o. female admitted on 04/27/2020 with UTI.  Pharmacy has been consulted for aztreonam dosing. Pt is afebrile but WBC is elevated at 16.8. Scr is also elevated above baseline at 1.9.   Plan: Aztreonam 500mg  IV Q8H F/u renal fxn, C&S, clinical status  Height: 5\' 4"  (162.6 cm) Weight: 94.8 kg (209 lb) IBW/kg (Calculated) : 54.7  Temp (24hrs), Avg:98.9 F (37.2 C), Min:97.8 F (36.6 C), Max:99.9 F (37.7 C)  Recent Labs  Lab 04/27/20 1259 04/27/20 1310 04/27/20 1446  WBC 16.8*  --   --   CREATININE 1.90* 1.80*  --   LATICACIDVEN 1.4  --  1.2    Estimated Creatinine Clearance: 26.9 mL/min (A) (by C-G formula based on SCr of 1.8 mg/dL (H)).    Allergies  Allergen Reactions  . Penicillins Anaphylaxis  . Sulfonylureas Other (See Comments)    Unknown reaction  . Codeine Other (See Comments)    Unknown reaction  . Sulfonamide Derivatives Other (See Comments)    Unknown reaction    Antimicrobials this admission: Aztreo 8/21>> Vanc x 1 8/21  Dose adjustments this admission: N/A  Microbiology results: Pending  Thank you for allowing pharmacy to be a part of this patient's care.  Eddy Termine, Rande Lawman 04/27/2020 4:26 PM

## 2020-04-27 NOTE — H&P (Signed)
[facility] History and Physical  Dawn Morrow:[id number] DOB: [birth year] DOA: 04/27/2020  PCP: Patient, No Pcp Per  Patient coming from: [facility]  Chief Complaint: Confusion  HPI: Dawn Morrow is a 84 y.o. female with a medical history of hypertension, hyperlipidemia, hypothyroidism, atrial fibrillation, history of PE and DVT, who presented to the emergency department with confusion.  She was recently admitted in July 2021 with similar presentation was found to have acute metabolic encephalopathy secondary to Enterococcus faecalis UTI, and patient was placed on vancomycin for 3 days.  Patient was discharged to nursing facility and most recently went to [facility] on 04/23/2020.  Her niece was visiting with her this morning and noticed that she was completely altered and not aware of anything.  No further history could be obtained.  ED Course: Patient found to have respiratory failure, VBG conducted showing PCO2 greater than 60.  Patient placed on BiPAP.  Chest x-ray obtained showing possible infection.  UA also showing possible infection.  Patient started on IV antibiotics and given 1 L fluid bolus.  Patient also noted to have acute kidney injury with a creatinine of 1.9.  TRH called for admission.  Review of Systems:  Cannot obtain from patient as she is currently altered.   Past Medical History:  Diagnosis Date  . Anxiety and depression   . Asthma   . Cataract   . COPD (chronic obstructive pulmonary disease) ([place])   . Diabetes mellitus   . Diverticulosis   . DVT (deep vein thrombosis) in pregnancy    hx x multiple on coumadin  . GERD (gastroesophageal reflux disease)   . Hemorrhoids   . Hiatal hernia   . Hyperlipidemia   . Hypertension   . Hypothyroidism   . On home oxygen therapy    "2L; 24/7" (09/17/2017)  . Osteoarthritis    h/o spinal stenosis by MRI 2009  . Osteopenia   . Pulmonary embolism (HCC)    x 2  . Stroke [facility])    MINI  .  Tubular adenoma of colon 07/2005    Past Surgical History:  Procedure Laterality Date  . APPENDECTOMY    . CATARACT EXTRACTION    . CHOLECYSTECTOMY    . LUMBAR FUSION    . POLYPECTOMY    . TONSILLECTOMY    . TUBAL LIGATION      Social History:  reports that she has quit smoking. Her smoking use included cigarettes. She has never used smokeless tobacco. She reports that she does not drink alcohol and does not use drugs.   Allergies  Allergen Reactions  . Penicillins Anaphylaxis  . Sulfonylureas Other (See Comments)    Unknown reaction  . Codeine Other (See Comments)    Unknown reaction  . Sulfonamide Derivatives Other (See Comments)    Unknown reaction    Family History  Adopted: Yes  Problem Relation Age of Onset  . Cancer Mother        ? colon or ovarian  . Diabetes Mother   . Cancer Brother        ?  . CAD Neg Hx      Prior to Admission medications   Medication Sig Start Date End Date Taking? Authorizing Provider  atorvastatin (LIPITOR) 10 MG tablet Take 1 tablet (10 mg total) by mouth at bedtime. 10/21/15  Yes Paz, Alda Berthold, MD  buPROPion HCl ER, XL, 450 MG TB24 Take 450 mg by mouth daily.  11/20/19  Yes [provider]  docusate sodium (COLACE) 100 MG capsule Take 100 mg by mouth daily.   Yes [provider]  ezetimibe (ZETIA) 10 MG tablet Take 10 mg by mouth daily.   Yes [provider]  gabapentin (NEURONTIN) 300 MG capsule Take 1 cap in AM, 1 cap at noon, 2 caps at bedtime Patient taking differently: Take 300-600 mg by mouth See admin instructions. Take one capsule (300 mg) by mouth at 9am and 2pm, take two capsules (600 mg) at bedtime 12/21/14  Yes Cameron Sprang, MD  insulin glargine (LANTUS) 100 UNIT/ML injection Inject 15 Units into the skin daily.   Yes [provider]  ketoconazole (NIZORAL) 2 % shampoo Apply 1 application topically See admin instructions. Use topically to shampoo hair twice weekly - Tuesday and Friday   Yes  [provider]  levothyroxine (SYNTHROID) 88 MCG tablet Take 88 mcg by mouth daily before breakfast.   Yes [provider]  linagliptin (TRADJENTA) 5 MG TABS tablet Take 1 tablet (5 mg total) by mouth at bedtime. 07/15/16  Yes Paz, Alda Berthold, MD  rivaroxaban (XARELTO) 20 MG TABS tablet Take 1 tablet (20 mg total) by mouth daily with supper. Patient taking differently: Take 20 mg by mouth every evening.  09/14/14  Yes Paz, Alda Berthold, MD  acetaminophen (TYLENOL) 500 MG tablet Take 500 mg by mouth every 4 (four) hours as needed for mild pain or headache.     [provider]  albuterol (PROVENTIL HFA;VENTOLIN HFA) 108 (90 BASE) MCG/ACT inhaler Inhale 2 puffs into the lungs every 6 (six) hours as needed for wheezing or shortness of breath. 08/05/15   Saguier, Percell Miller, PA-C  carvedilol (COREG) 6.25 MG tablet Take 1 tablet (6.25 mg total) by mouth 2 (two) times daily with a meal. 12/18/19 03/27/20  Antonieta Pert, MD  LORazepam (ATIVAN) 0.5 MG tablet Take 1 tablet (0.5 mg total) by mouth every 8 (eight) hours as needed for anxiety. 04/04/20   Little Ishikawa, MD  meclizine (ANTIVERT) 25 MG tablet Take 25 mg by mouth at bedtime as needed for dizziness.    [provider]  ondansetron (ZOFRAN) 4 MG tablet Take 4 mg by mouth every 8 (eight) hours as needed for nausea or vomiting.    [provider]  polyethylene glycol (MIRALAX / GLYCOLAX) packet Take 17 g by mouth daily as needed for mild constipation. Reported on 10/09/2015    [provider]  Propylene Glycol (SYSTANE BALANCE) 0.6 % SOLN Place 1 drop into both eyes 4 (four) times daily as needed (for dry eyes).     [provider]  repaglinide (PRANDIN) 0.5 MG tablet Take 1 tablet (0.5 mg total) by mouth 3 (three) times daily before meals. 01/14/16   Colon Branch, MD  sertraline (ZOLOFT) 50 MG tablet Take 1.5 tablets by mouth daily at 12 noon. 11/20/19   [provider]    Physical Exam: Vitals:    04/27/20 1430 04/27/20 1515  BP: 125/77 94/72  Pulse: 82 (!) 51  Resp: 18 20  Temp:    SpO2: 90% 97%     General: Well developed, well nourished, elderly, chronically ill appearing, NAD, appears stated age  HEENT: NCAT, PERRLA, EOMI, Anicteic Sclera, BiPAP in place  Neck: Supple, no JVD, no masses  Cardiovascular: S1 S2 auscultated, irregularly irregular  Respiratory: Diminished breath sounds, no wheezing  Abdomen: Soft, nontender, nondistended, + bowel sounds  Extremities: warm dry without cyanosis clubbing or edema  Neuro: AAOx2 (  person, place), cranial nerves grossly intact. Strength 5/5 in patient's upper and lower extremities bilaterally  Skin: Without rashes exudates or nodules  Psych: cannot fully assess  Labs on Admission: I have personally reviewed following labs and imaging studies CBC: Recent Labs  Lab 04/27/20 1259 04/27/20 1310 04/27/20 1359  WBC 16.8*  --   --   NEUTROABS 14.4*  --   --   HGB 12.7 14.3 14.3  HCT 42.6 42.0 42.0  MCV 95.7  --   --   PLT 180  --   --    Basic Metabolic Panel: Recent Labs  Lab 04/27/20 1259 04/27/20 1310 04/27/20 1359  NA 141 141 142  K 4.9 5.3* 5.2*  CL 100 101  --   CO2 28  --   --   GLUCOSE 108* 106*  --   BUN 31* 45*  --   CREATININE 1.90* 1.80*  --   CALCIUM 8.6*  --   --    GFR: CrCl cannot be calculated (Unknown ideal weight.). Liver Function Tests: Recent Labs  Lab 04/27/20 1259  AST 19  ALT 21  ALKPHOS 70  BILITOT 1.0  PROT 6.6  ALBUMIN 3.1*   No results for input(s): LIPASE, AMYLASE in the last 168 hours. No results for input(s): AMMONIA in the last 168 hours. Coagulation Profile: Recent Labs  Lab 04/27/20 1259  INR 1.9*   Cardiac Enzymes: Recent Labs  Lab 04/27/20 1259  CKTOTAL 42   BNP (last 3 results) No results for input(s): PROBNP in the last 8760 hours. HbA1C: No results for input(s): HGBA1C in the last 72 hours. CBG: Recent Labs  Lab 04/27/20 1241  GLUCAP 100*    Lipid Profile: No results for input(s): CHOL, HDL, LDLCALC, TRIG, CHOLHDL, LDLDIRECT in the last 72 hours. Thyroid Function Tests: No results for input(s): TSH, T4TOTAL, FREET4, T3FREE, THYROIDAB in the last 72 hours. Anemia Panel: No results for input(s): VITAMINB12, FOLATE, FERRITIN, TIBC, IRON, RETICCTPCT in the last 72 hours. Urine analysis:    Component Value Date/Time   COLORURINE AMBER (A) 04/27/2020 1303   APPEARANCEUR CLOUDY (A) 04/27/2020 1303   LABSPEC 1.018 04/27/2020 1303   PHURINE 8.0 04/27/2020 1303   GLUCOSEU NEGATIVE 04/27/2020 1303   GLUCOSEU NEGATIVE 06/12/2014 1410   HGBUR NEGATIVE 04/27/2020 1303   BILIRUBINUR NEGATIVE 04/27/2020 1303   BILIRUBINUR Neg 06/19/2016 1101   KETONESUR NEGATIVE 04/27/2020 1303   PROTEINUR 100 (A) 04/27/2020 1303   UROBILINOGEN >=8.0 06/19/2016 1101   UROBILINOGEN 0.2 05/31/2015 1835   NITRITE NEGATIVE 04/27/2020 1303   LEUKOCYTESUR LARGE (A) 04/27/2020 1303   Sepsis Labs: @LABRCNTIP (procalcitonin:4,lacticidven:4) ) Recent Results (from the past 240 hour(s))  SARS Coronavirus 2 by RT PCR (hospital order, performed in [place] hospital lab) Nasopharyngeal Nasopharyngeal Swab     Status: None   Collection Time: 04/27/20 12:58 PM   Specimen: Nasopharyngeal Swab  Result Value Ref Range Status   SARS Coronavirus 2 NEGATIVE NEGATIVE Final    Comment: (NOTE) SARS-CoV-2 target nucleic acids are NOT DETECTED.  The SARS-CoV-2 RNA is generally detectable in upper and lower respiratory specimens during the acute phase of infection. The lowest concentration of SARS-CoV-2 viral copies this assay can detect is 250 copies / mL. A negative result does not preclude SARS-CoV-2 infection and should not be used as the sole basis for treatment or other patient management decisions.  A negative result may occur with improper specimen collection / handling, submission of specimen other than nasopharyngeal swab, presence of  viral mutation(s)  within the areas targeted by this assay, and inadequate number of viral copies (<250 copies / mL). A negative result must be combined with clinical observations, patient history, and epidemiological information.  Fact Sheet for Patients:   StrictlyIdeas.no  Fact Sheet for Healthcare Providers: BankingDealers.co.za  This test is not yet approved or  cleared by the [place] FDA and has been authorized for detection and/or diagnosis of SARS-CoV-2 by FDA under an Emergency Use Authorization (EUA).  This EUA will remain in effect (meaning this test can be used) for the duration of the COVID-19 declaration under Section 564(b)(1) of the Act, 21 U.S.C. section 360bbb-3(b)(1), unless the authorization is terminated or revoked sooner.  Performed at [facility], [place] [address] [place], [place] [address]      Radiological Exams on Admission: CT Head Wo Contrast  Result Date: 04/27/2020 CLINICAL DATA:  84 year old female with altered mental status and confusion. EXAM: CT HEAD WITHOUT CONTRAST TECHNIQUE: Contiguous axial images were obtained from the base of the skull through the vertex without intravenous contrast. COMPARISON:  03/26/2020 CT and prior studies FINDINGS: Brain: No evidence of acute infarction, hemorrhage, hydrocephalus, extra-axial collection or mass lesion/mass effect. Atrophy, chronic small-vessel white matter ischemic changes and remote bilateral basal ganglia infarcts are again noted. Vascular: Carotid atherosclerotic calcifications are noted. Skull: Normal. Negative for fracture or focal lesion. Sinuses/Orbits: No acute finding. Other: None. IMPRESSION: 1. No evidence of acute intracranial abnormality. 2. Atrophy, chronic small-vessel white matter ischemic changes and remote bilateral basal ganglia infarcts. Electronically Signed   By: Margarette Canada M.D.   On: 04/27/2020 14:50   DG Chest Port 1 View  Result Date:  04/27/2020 CLINICAL DATA:  New onset confusion EXAM: PORTABLE CHEST 1 VIEW COMPARISON:  03/31/2019 FINDINGS: Chronic cardiomegaly. Extensive artifact from EKG leads. Low volume chest with interstitial coarsening, chronic. No edema, effusion, or pneumothorax. IMPRESSION: 1. Limited portable chest. 2. Cardiomegaly without failure. 3. Increased retrocardiac markings but improved from 03/30/2020 chest x-ray Electronically Signed   By: Monte Fantasia M.D.   On: 04/27/2020 14:03    EKG: Independently reviewed.  Atrial fibrillation,  Rate 103  Assessment/Plan  Severe sepsis secondary to UTI and possible pneumonia -Present on admission; patient presented with leukocytosis, tachycardia, acute kidney injury as well as acute metabolic encephalopathy -UA showed rare bacteria, >50 WBC, large leukocytes -Chest x-ray: Cardiomegaly without failure, increased retrocardiac markings -Urine and blood cultures pending -Patient has a penicillin allergy, therefore be placed on azithromycin as well aztreonam -Of note, patient has had several UTIs in the past month and a half, there may be question of colonization however given her acute altered mental status, will treat  Acute metabolic encephalopathy -Likely multifactorial including infection versus hypercarbia -Continue to treat underlying etiology -CT head unremarkable for acute findings -Continue to monitor closely  Acute on chronic hypoxic/hypercarbic respiratory failure -Patient with history of hypoxia and uses 3 L of oxygen at baseline.  Also has a history of COPD -Currently no wheezing noted -VBG showed pCO2 2.3 -Patient currently on BiPAP -Will attempt to wean as possible  Acute kidney injury on chronic kidney disease, stage IIIb -Baseline creatinine has been approximately 1.1-1.5 -Creatinine currently 1.9 -Continue IV fluids and monitor BMP  Hypotension -Possibly secondary to sepsis and dehydration -Per family, patient does not like to drink  water -Will place on IV fluid  History of PE/DVT, atrial fibrillation  -Currently on Xarelto -Heart rate controlled -Will hold Coreg as blood pressures have been  soft  Essential hypertension -Patient with hypotension at this time, will hold Coreg  Hypothyroidism -Continue Synthroid  COPD with chronic respiratory hypoxic failure -As above, patient uses 3 L of nasal cannula at baseline -Currently no wheezing  Diabetes mellitus, type II -hemoglobin A1c 6.1 in April 2021 -Continue Lantus at reduced dose as patient is currently n.p.o. -Continue insulin sliding scale and CBG monitoring  Goals of care -Discussed with niece at bedside patient's CODE STATUS.  She would like to discuss it with her brother further. -Currently remains full code  DVT prophylaxis: Xarelto  Code Status: Full code  Family Communication: Niece at bedside.   Disposition Plan: [facility] assisted [facility] facility  Consults called: None  Admission status: Inpatient.  Currently patient exhibits signs and symptoms of sepsis secondary to pneumonia and urinary tract infection along with acute kidney injury and acute metabolic encephalopathy.  She is also having acute respiratory failure and currently requiring BiPAP.  Patient requires at least a 2 midnight hospitalization.  Time spent: 70 minutes  Emelie Newsom D.O. [facility]  Between 7am to 7pm - Please see pager noted on [email or web address]  After 7pm go to [email or web address] 04/27/2020, 3:49 PM

## 2020-04-27 NOTE — ED Notes (Signed)
CBG 65mg /dL, 4oz OJ provided, rechecked, now 107mg /dL. Pt mentation remains A&Ox1throughout (presented like this to ED)

## 2020-04-27 NOTE — ED Notes (Signed)
Pt is confused, attempts to remove bipap continually. For pt safety, nonviolent restraints applied/ordered (see orders). Ativan provided (see MAR). Family remains at bedside.

## 2020-04-27 NOTE — ED Triage Notes (Signed)
Patient arrives to ED from [facility] with [facility]. Patient daughter called EMS today due to new onset of confusion for patient, no LKN. Pt normally alert, now confused. EMS found pt flat in bed and unresponsive at the facility. Patient admitted for AMS due to UTI in July.

## 2020-04-28 LAB — BLOOD CULTURE ID PANEL (REFLEXED) - BCID2

## 2020-04-28 LAB — COMPREHENSIVE METABOLIC PANEL
ALT: 20 U/L (ref 0–44)
AST: 16 U/L (ref 15–41)
Albumin: 2.9 g/dL — ABNORMAL LOW (ref 3.5–5.0)
Alkaline Phosphatase: 65 U/L (ref 38–126)
Anion gap: 8 (ref 5–15)
BUN: 28 mg/dL — ABNORMAL HIGH (ref 8–23)
CO2: 29 mmol/L (ref 22–32)
Calcium: 8.4 mg/dL — ABNORMAL LOW (ref 8.9–10.3)
Chloride: 105 mmol/L (ref 98–111)
Creatinine, Ser: 1.58 mg/dL — ABNORMAL HIGH (ref 0.44–1.00)
GFR calc Af Amer: 35 mL/min — ABNORMAL LOW (ref >60)
GFR calc non Af Amer: 30 mL/min — ABNORMAL LOW (ref >60)
Glucose, Bld: 85 mg/dL (ref 70–99)
Potassium: 4.3 mmol/L (ref 3.5–5.1)
Sodium: 142 mmol/L (ref 135–145)
Total Bilirubin: 0.8 mg/dL (ref 0.3–1.2)
Total Protein: 6.4 g/dL — ABNORMAL LOW (ref 6.5–8.1)

## 2020-04-28 LAB — CBC
HCT: 39.3 % (ref 36.0–46.0)
Hemoglobin: 11.7 g/dL — ABNORMAL LOW (ref 12.0–15.0)
MCH: 28.5 pg (ref 26.0–34.0)
MCHC: 29.8 g/dL — ABNORMAL LOW (ref 30.0–36.0)
MCV: 95.6 fL (ref 80.0–100.0)
Platelets: 163 10*3/uL (ref 150–400)
RBC: 4.11 MIL/uL (ref 3.87–5.11)
RDW: 17.2 % — ABNORMAL HIGH (ref 11.5–15.5)
WBC: 15.1 10*3/uL — ABNORMAL HIGH (ref 4.0–10.5)
nRBC: 0 % (ref 0.0–0.2)

## 2020-04-28 LAB — GLUCOSE, CAPILLARY
Glucose-Capillary: 82 mg/dL (ref 70–99)
Glucose-Capillary: 80 mg/dL (ref 70–99)
Glucose-Capillary: 99 mg/dL (ref 70–99)
Glucose-Capillary: 96 mg/dL (ref 70–99)

## 2020-04-28 LAB — LEGIONELLA PNEUMOPHILA SEROGP 1 UR AG: L. pneumophila Serogp 1 Ur Ag: NEGATIVE

## 2020-04-28 LAB — PROTIME-INR
INR: 3.3 — ABNORMAL HIGH (ref 0.8–1.2)
Prothrombin Time: 32.2 seconds — ABNORMAL HIGH (ref 11.4–15.2)

## 2020-04-28 LAB — HEMOGLOBIN A1C
Hgb A1c MFr Bld: 6.1 % — ABNORMAL HIGH (ref 4.8–5.6)
Mean Plasma Glucose: 128.37 mg/dL

## 2020-04-28 LAB — PROCALCITONIN: Procalcitonin: <0.1 ng/mL

## 2020-04-28 LAB — MRSA PCR SCREENING: MRSA by PCR: NEGATIVE

## 2020-04-28 LAB — CORTISOL-AM, BLOOD: Cortisol - AM: 13.3 ug/dL (ref 6.7–22.6)

## 2020-04-28 MED ORDER — GABAPENTIN 300 MG PO CAPS
300.0000 mg | ORAL_CAPSULE | Freq: Two times a day (BID) | ORAL | Status: DC
  Administered 2020-04-28 – 2020-05-05 (×14): 300 mg via ORAL
  Filled 2020-04-28 (×15): qty 1

## 2020-04-28 MED ORDER — CARVEDILOL 6.25 MG PO TABS
6.2500 mg | ORAL_TABLET | Freq: Two times a day (BID) | ORAL | Status: DC
  Administered 2020-04-28 – 2020-05-01 (×7): 6.25 mg via ORAL
  Filled 2020-04-28 (×7): qty 1

## 2020-04-28 MED ORDER — ALBUTEROL SULFATE (2.5 MG/3ML) 0.083% IN NEBU: 2.5000 mg | INHALATION_SOLUTION | Freq: Four times a day (QID) | RESPIRATORY_TRACT | Status: DC | PRN

## 2020-04-28 MED ORDER — GABAPENTIN 600 MG PO TABS
600.0000 mg | ORAL_TABLET | Freq: Every day | ORAL | Status: DC
  Administered 2020-04-28 – 2020-05-05 (×8): 600 mg via ORAL
  Filled 2020-04-28 (×8): qty 1

## 2020-04-28 NOTE — ED Notes (Signed)
Pt voids when purewick removed for in/out cath

## 2020-04-28 NOTE — ED Notes (Signed)
Pt no longer using bipap, on 5L. Restraints removed as these were applied for compliance, mittens applied, remains A&Ox1.

## 2020-04-28 NOTE — Progress Notes (Signed)
Pt received from ED via bed, Pt alert to self, otherwise confuse. Vitals stable at the time of arrival, on 6l 02 via [place], dropped to 5 lit as Pt WAS SATING 100%. CHG bath completed, connected to tele and CCMD notified. Pt has mittens on bilateral hands, bed alarm on. Oriented pt to room and call bell system, call bell within reach. Will continue to monitor.

## 2020-04-28 NOTE — Progress Notes (Signed)
PROGRESS NOTE    Dawn Morrow  KGM:[id number] DOB: May 05, 1936 DOA: 04/27/2020 PCP: Patient, No Pcp Per   Brief Narrative:  HPI on 04/27/2020 Dawn Morrow is a 84 y.o. female with a medical history of hypertension, hyperlipidemia, hypothyroidism, atrial fibrillation, history of PE and DVT, who presented to the emergency department with confusion.  She was recently admitted in July 2021 with similar presentation was found to have acute metabolic encephalopathy secondary to Enterococcus faecalis UTI, and patient was placed on vancomycin for 3 days.  Patient was discharged to nursing facility and most recently went to [facility] assisted living facility on 04/23/2020.  Her niece was visiting with her this morning and noticed that she was completely altered and not aware of anything.  No further history could be obtained. Assessment & Plan   Severe sepsis secondary to UTI and possible pneumonia -Present on admission; patient presented with leukocytosis, tachycardia, acute kidney injury as well as acute metabolic encephalopathy -UA showed rare bacteria, >50 WBC, large leukocytes -Chest x-ray: Cardiomegaly without failure, increased retrocardiac markings -Urine culture pending -Blood Cultures showed no growth to date -Patient has a penicillin allergy, therefore be placed on azithromycin as well aztreonam -Of note, patient has had several UTIs in the past month and a half, there may be question of colonization however given her acute altered mental status, will treat  Acute metabolic encephalopathy -Likely multifactorial including infection versus hypercarbia -Continue to treat underlying etiology -CT head unremarkable for acute findings -Continue to monitor closely  Acute on chronic hypoxic/hypercarbic respiratory failure -Patient with history of hypoxia and uses 3 L of oxygen at baseline.  Also has a history of COPD -Currently no wheezing noted -VBG showed pCO2 62.3 (on admission) -Was placed  on BiPAP -ABG obtained on the evening of 821 showed improvement of PCO2 down to 52.1 -Continue supplemental oxygen and monitor  Acute kidney injury on chronic kidney disease, stage IIIb -Baseline creatinine has been approximately 1.1-1.5 -Creatinine on admission was 1.9, however improved down to 1.58 -Continue IV fluids and monitor BMP  Hypotension -Possibly secondary to sepsis and dehydration -Per family, patient does not like to drink water -improving, now hypertensive  History of PE/DVT, atrial fibrillation with RVR -Currently on Xarelto -Will restart coreg and monitor   Essential hypertension -Restart Coreg today  Hypothyroidism -Continue Synthroid  COPD with chronic respiratory hypoxic failure -As above, patient uses 3 L of nasal cannula at baseline -Currently no wheezing  Diabetes mellitus, type II -hemoglobin A1c 6.1 in April 2021 -Continue Lantus at reduced dose as patient is currently n.p.o. -Continue insulin sliding scale and CBG monitoring  Goals of care -Discussed with niece at bedside patient's CODE STATUS.  She would like to discuss it with her brother further. -Currently remains full code  DVT Prophylaxis  Xarelto  Code Status: Full  Family Communication: None at bedside  Disposition Plan:  Status is: Inpatient  Remains inpatient appropriate because:Altered mental status   Dispo: The patient is from: ALF              Anticipated d/c is to: TBD              Anticipated d/c date is: 3 days              Patient currently is not medically stable to d/c.   Consultants None  Procedures  None  Antibiotics   Anti-infectives (From admission, onward)   Start     Dose/Rate Route Frequency Ordered Stop  04/28/20 0000  aztreonam (AZACTAM) 0.5 g in dextrose 5 % 50 mL IVPB        0.5 g 100 mL/hr over 30 Minutes Intravenous Every 8 hours 04/27/20 1625     04/27/20 1745  aztreonam (AZACTAM) 2 g in sodium chloride 0.9 % 100 mL IVPB  Status:   Discontinued        2 g 200 mL/hr over 30 Minutes Intravenous  Once 04/27/20 1739 04/27/20 1818   04/27/20 1745  azithromycin (ZITHROMAX) 500 mg in sodium chloride 0.9 % 250 mL IVPB        500 mg 250 mL/hr over 60 Minutes Intravenous Every 24 hours 04/27/20 1739     04/27/20 1445  vancomycin (VANCOREADY) IVPB 2000 mg/400 mL        2,000 mg 200 mL/hr over 120 Minutes Intravenous  Once 04/27/20 1430 04/27/20 1829   04/27/20 1430  aztreonam (AZACTAM) 2 g in sodium chloride 0.9 % 100 mL IVPB        2 g 200 mL/hr over 30 Minutes Intravenous  Once 04/27/20 1430 04/27/20 1737      Subjective:   Dawn Morrow seen and examined today.  Patient continues to be confused. States she wants to go home.  Objective:   Vitals:   04/28/20 0130 04/28/20 0228 04/28/20 0720 04/28/20 1027  BP: (!) 118/94 (!) 136/93 (!) 141/83 132/67  Pulse: (!) 136 (!) 101 65 77  Resp:  19 18 20   Temp: 98.1 F (36.7 C) 98.4 F (36.9 C) 97.9 F (36.6 C) 97.7 F (36.5 C)  TempSrc: Oral Oral Oral Oral  SpO2: 96% 92% 95% 100%  Weight:      Height:        Intake/Output Summary (Last 24 hours) at 04/28/2020 1058 Last data filed at 04/28/2020 0500 Gross per 24 hour  Intake 1820 ml  Output --  Net 1820 ml   Filed Weights   04/27/20 1600  Weight: 94.8 kg    Exam  General: Well developed, well nourished, elderly, NAD, appears stated age  40: NCAT, mucous membranes moist.   Cardiovascular: S1 S2 auscultated, irregularly irregular  Respiratory: Diminished breath sounds  Abdomen: Soft, nontender, nondistended, + bowel sounds  Extremities: warm dry without cyanosis clubbing or edema  Neuro: AAOx1 (self only), nonfocal, slight upper extremity tremor  Psych: Pleasantly confused   Data Reviewed: I have personally reviewed following labs and imaging studies  CBC: Recent Labs  Lab 04/27/20 1259 04/27/20 1310 04/27/20 1359 04/27/20 1708 04/28/20 0316  WBC 16.8*  --   --   --  15.1*  NEUTROABS 14.4*   --   --   --   --   HGB 12.7 14.3 14.3 12.6 11.7*  HCT 42.6 42.0 42.0 37.0 39.3  MCV 95.7  --   --   --  95.6  PLT 180  --   --   --  093   Basic Metabolic Panel: Recent Labs  Lab 04/27/20 1259 04/27/20 1310 04/27/20 1359 04/27/20 1708 04/28/20 0316  NA 141 141 142 144 142  K 4.9 5.3* 5.2* 4.6 4.3  CL 100 101  --   --  105  CO2 28  --   --   --  29  GLUCOSE 108* 106*  --   --  85  BUN 31* 45*  --   --  28*  CREATININE 1.90* 1.80*  --   --  1.58*  CALCIUM 8.6*  --   --   --  8.4*   GFR: Estimated Creatinine Clearance: 30.6 mL/min (A) (by C-G formula based on SCr of 1.58 mg/dL (H)). Liver Function Tests: Recent Labs  Lab 04/27/20 1259 04/28/20 0316  AST 19 16  ALT 21 20  ALKPHOS 70 65  BILITOT 1.0 0.8  PROT 6.6 6.4*  ALBUMIN 3.1* 2.9*   No results for input(s): LIPASE, AMYLASE in the last 168 hours. No results for input(s): AMMONIA in the last 168 hours. Coagulation Profile: Recent Labs  Lab 04/27/20 1259 04/28/20 0316  INR 1.9* 3.3*   Cardiac Enzymes: Recent Labs  Lab 04/27/20 1259  CKTOTAL 42   BNP (last 3 results) No results for input(s): PROBNP in the last 8760 hours. HbA1C: Recent Labs    04/28/20 0316  HGBA1C 6.1*   CBG: Recent Labs  Lab 04/27/20 1241 04/27/20 2141 04/27/20 2213 04/28/20 0639  GLUCAP 100* 65* 107* 82   Lipid Profile: No results for input(s): CHOL, HDL, LDLCALC, TRIG, CHOLHDL, LDLDIRECT in the last 72 hours. Thyroid Function Tests: No results for input(s): TSH, T4TOTAL, FREET4, T3FREE, THYROIDAB in the last 72 hours. Anemia Panel: No results for input(s): VITAMINB12, FOLATE, FERRITIN, TIBC, IRON, RETICCTPCT in the last 72 hours. Urine analysis:    Component Value Date/Time   COLORURINE AMBER (A) 04/27/2020 1303   APPEARANCEUR CLOUDY (A) 04/27/2020 1303   LABSPEC 1.018 04/27/2020 1303   PHURINE 8.0 04/27/2020 1303   GLUCOSEU NEGATIVE 04/27/2020 1303   GLUCOSEU NEGATIVE 06/12/2014 1410   HGBUR NEGATIVE 04/27/2020  1303   BILIRUBINUR NEGATIVE 04/27/2020 1303   BILIRUBINUR Neg 06/19/2016 1101   KETONESUR NEGATIVE 04/27/2020 1303   PROTEINUR 100 (A) 04/27/2020 1303   UROBILINOGEN >=8.0 06/19/2016 1101   UROBILINOGEN 0.2 05/31/2015 1835   NITRITE NEGATIVE 04/27/2020 1303   LEUKOCYTESUR LARGE (A) 04/27/2020 1303   Sepsis Labs: @LABRCNTIP (procalcitonin:4,lacticidven:4)  ) Recent Results (from the past 240 hour(s))  SARS Coronavirus 2 by RT PCR (hospital order, performed in [place] hospital lab) Nasopharyngeal Nasopharyngeal Swab     Status: None   Collection Time: 04/27/20 12:58 PM   Specimen: Nasopharyngeal Swab  Result Value Ref Range Status   SARS Coronavirus 2 NEGATIVE NEGATIVE Final    Comment: (NOTE) SARS-CoV-2 target nucleic acids are NOT DETECTED.  The SARS-CoV-2 RNA is generally detectable in upper and lower respiratory specimens during the acute phase of infection. The lowest concentration of SARS-CoV-2 viral copies this assay can detect is 250 copies / mL. A negative result does not preclude SARS-CoV-2 infection and should not be used as the sole basis for treatment or other patient management decisions.  A negative result may occur with improper specimen collection / handling, submission of specimen other than nasopharyngeal swab, presence of viral mutation(s) within the areas targeted by this assay, and inadequate number of viral copies (<250 copies / mL). A negative result must be combined with clinical observations, patient history, and epidemiological information.  Fact Sheet for Patients:   StrictlyIdeas.no  Fact Sheet for Healthcare Providers: BankingDealers.co.za  This test is not yet approved or  cleared by the [place] FDA and has been authorized for detection and/or diagnosis of SARS-CoV-2 by FDA under an Emergency Use Authorization (EUA).  This EUA will remain in effect (meaning this test can be used) for the  duration of the COVID-19 declaration under Section 564(b)(1) of the Act, 21 U.S.C. section 360bbb-3(b)(1), unless the authorization is terminated or revoked sooner.  Performed at [facility], [place] [address] [place], [place] [address]  Culture, blood (routine x 2)     Status: None (Preliminary result)   Collection Time: 04/27/20 12:59 PM   Specimen: BLOOD RIGHT FOREARM  Result Value Ref Range Status   Specimen Description BLOOD RIGHT FOREARM  Final   Special Requests   Final    BOTTLES DRAWN AEROBIC AND ANAEROBIC Blood Culture adequate volume   Culture   Final    NO GROWTH < 24 HOURS Performed at [facility], [place] [address] [place], [place] [address]    Report Status PENDING  Incomplete  Urine culture     Status: None (Preliminary result)   Collection Time: 04/27/20  1:03 PM   Specimen: Urine, Random  Result Value Ref Range Status   Specimen Description URINE, RANDOM  Final   Special Requests NONE  Final   Culture   Final    CULTURE REINCUBATED FOR BETTER GROWTH Performed at [facility], [place] [address] [place], [place] [address]    Report Status PENDING  Incomplete  Culture, blood (routine x 2)     Status: None (Preliminary result)   Collection Time: 04/27/20  2:32 PM   Specimen: BLOOD  Result Value Ref Range Status   Specimen Description BLOOD SITE NOT SPECIFIED  Final   Special Requests   Final    BOTTLES DRAWN AEROBIC AND ANAEROBIC Blood Culture adequate volume   Culture   Final    NO GROWTH < 24 HOURS Performed at [facility], [place] [address] [place], [place] [address]    Report Status PENDING  Incomplete  MRSA PCR Screening     Status: None   Collection Time: 04/28/20  4:01 AM   Specimen: Nasopharyngeal  Result Value Ref Range Status   MRSA by PCR NEGATIVE NEGATIVE Final    Comment:        The GeneXpert MRSA Assay (FDA approved for NASAL specimens only), is one component of a comprehensive MRSA colonization surveillance program.  It is not intended to diagnose MRSA infection nor to guide or monitor treatment for MRSA infections. Performed at [facility], [place] [address] [place], [place] [address]       Radiology Studies: CT Head Wo Contrast  Result Date: 04/27/2020 CLINICAL DATA:  84 year old female with altered mental status and confusion. EXAM: CT HEAD WITHOUT CONTRAST TECHNIQUE: Contiguous axial images were obtained from the base of the skull through the vertex without intravenous contrast. COMPARISON:  03/26/2020 CT and prior studies FINDINGS: Brain: No evidence of acute infarction, hemorrhage, hydrocephalus, extra-axial collection or mass lesion/mass effect. Atrophy, chronic small-vessel white matter ischemic changes and remote bilateral basal ganglia infarcts are again noted. Vascular: Carotid atherosclerotic calcifications are noted. Skull: Normal. Negative for fracture or focal lesion. Sinuses/Orbits: No acute finding. Other: None. IMPRESSION: 1. No evidence of acute intracranial abnormality. 2. Atrophy, chronic small-vessel white matter ischemic changes and remote bilateral basal ganglia infarcts. Electronically Signed   By: Margarette Canada M.D.   On: 04/27/2020 14:50   DG Chest Port 1 View  Result Date: 04/27/2020 CLINICAL DATA:  New onset confusion EXAM: PORTABLE CHEST 1 VIEW COMPARISON:  03/31/2019 FINDINGS: Chronic cardiomegaly. Extensive artifact from EKG leads. Low volume chest with interstitial coarsening, chronic. No edema, effusion, or pneumothorax. IMPRESSION: 1. Limited portable chest. 2. Cardiomegaly without failure. 3. Increased retrocardiac markings but improved from 03/30/2020 chest x-ray Electronically Signed   By: Monte Fantasia M.D.   On: 04/27/2020 14:03     Scheduled Meds: . atorvastatin  10 mg Oral QHS  . buPROPion  450 mg Oral Daily  . docusate sodium  100 mg Oral Daily  . ezetimibe  10 mg Oral Daily  . gabapentin  300 mg Oral BID WC  . gabapentin  600 mg Oral QHS  . insulin  aspart  0-5 Units Subcutaneous QHS  . insulin aspart  0-9 Units Subcutaneous TID WC  . insulin glargine  7 Units Subcutaneous QHS  . levothyroxine  88 mcg Oral QAC breakfast  . rivaroxaban  20 mg Oral QPM  . sertraline  75 mg Oral Daily   Continuous Infusions: . sodium chloride Stopped (04/28/20 0500)  . azithromycin Stopped (04/27/20 2025)  . aztreonam 0.5 g (04/28/20 0955)     LOS: 1 day   Time Spent in minutes   45 minutes  Merci Walthers D.O. on 04/28/2020 at 10:58 AM  Between 7am to 7pm - Please see pager noted on [email or web address]  After 7pm go to [email or web address]  And look for the night coverage person covering for me after hours  [facility] Office  [phone]

## 2020-04-28 NOTE — Progress Notes (Signed)
PHARMACY - PHYSICIAN COMMUNICATION CRITICAL VALUE ALERT - BLOOD CULTURE IDENTIFICATION (BCID)  Dawn Morrow is an 84 y.o. female who presented to [facility] on 04/27/2020 with a chief complaint of severe sepsis  Assessment:  1 of 4 bottles with GPC (staph epi) in blood, mec A detected  Name of physician (or Provider) Contacted: Chotiner, MD  Current antibiotics: Azithromycin, Aztreonam  Changes to prescribed antibiotics recommended:  No change needed. Likely contaminant  Results for orders placed or performed during the hospital encounter of 04/27/20  Blood Culture ID Panel (Reflexed) (Collected: 04/27/2020  2:32 PM)  Result Value Ref Range   Enterococcus faecalis NOT DETECTED NOT DETECTED   Enterococcus Faecium NOT DETECTED NOT DETECTED   Listeria monocytogenes NOT DETECTED NOT DETECTED   Staphylococcus species DETECTED (A) NOT DETECTED   Staphylococcus aureus (BCID) NOT DETECTED NOT DETECTED   Staphylococcus epidermidis DETECTED (A) NOT DETECTED   Staphylococcus lugdunensis NOT DETECTED NOT DETECTED   Streptococcus species NOT DETECTED NOT DETECTED   Streptococcus agalactiae NOT DETECTED NOT DETECTED   Streptococcus pneumoniae NOT DETECTED NOT DETECTED   Streptococcus pyogenes NOT DETECTED NOT DETECTED   A.calcoaceticus-baumannii NOT DETECTED NOT DETECTED   Bacteroides fragilis NOT DETECTED NOT DETECTED   Enterobacterales NOT DETECTED NOT DETECTED   Enterobacter cloacae complex NOT DETECTED NOT DETECTED   Escherichia coli NOT DETECTED NOT DETECTED   Klebsiella aerogenes NOT DETECTED NOT DETECTED   Klebsiella oxytoca NOT DETECTED NOT DETECTED   Klebsiella pneumoniae NOT DETECTED NOT DETECTED   Proteus species NOT DETECTED NOT DETECTED   Salmonella species NOT DETECTED NOT DETECTED   Serratia marcescens NOT DETECTED NOT DETECTED   Haemophilus influenzae NOT DETECTED NOT DETECTED   Neisseria meningitidis NOT DETECTED NOT DETECTED   Pseudomonas aeruginosa NOT DETECTED NOT  DETECTED   Stenotrophomonas maltophilia NOT DETECTED NOT DETECTED   Candida albicans NOT DETECTED NOT DETECTED   Candida auris NOT DETECTED NOT DETECTED   Candida glabrata NOT DETECTED NOT DETECTED   Candida krusei NOT DETECTED NOT DETECTED   Candida parapsilosis NOT DETECTED NOT DETECTED   Candida tropicalis NOT DETECTED NOT DETECTED   Cryptococcus neoformans/gattii NOT DETECTED NOT DETECTED   Methicillin resistance mecA/C DETECTED (A) NOT DETECTED    Desarae Placide A. Levada Dy, PharmD, BCPS, FNKF Clinical Pharmacist [place] Please utilize Amion for appropriate phone number to reach the unit pharmacist ([place])   04/28/2020  8:32 PM

## 2020-04-29 LAB — CBC
HCT: 37.8 % (ref 36.0–46.0)
Hemoglobin: 11.3 g/dL — ABNORMAL LOW (ref 12.0–15.0)
MCH: 28.7 pg (ref 26.0–34.0)
MCHC: 29.9 g/dL — ABNORMAL LOW (ref 30.0–36.0)
MCV: 95.9 fL (ref 80.0–100.0)
Platelets: 156 10*3/uL (ref 150–400)
RBC: 3.94 MIL/uL (ref 3.87–5.11)
RDW: 17.1 % — ABNORMAL HIGH (ref 11.5–15.5)
WBC: 9.7 10*3/uL (ref 4.0–10.5)
nRBC: 0 % (ref 0.0–0.2)

## 2020-04-29 LAB — BASIC METABOLIC PANEL
Anion gap: 7 (ref 5–15)
BUN: 20 mg/dL (ref 8–23)
CO2: 27 mmol/L (ref 22–32)
Calcium: 8 mg/dL — ABNORMAL LOW (ref 8.9–10.3)
Chloride: 105 mmol/L (ref 98–111)
Creatinine, Ser: 1.29 mg/dL — ABNORMAL HIGH (ref 0.44–1.00)
GFR calc Af Amer: 45 mL/min — ABNORMAL LOW (ref >60)
GFR calc non Af Amer: 39 mL/min — ABNORMAL LOW (ref >60)
Glucose, Bld: 88 mg/dL (ref 70–99)
Potassium: 4.5 mmol/L (ref 3.5–5.1)
Sodium: 139 mmol/L (ref 135–145)

## 2020-04-29 LAB — GLUCOSE, CAPILLARY
Glucose-Capillary: 87 mg/dL (ref 70–99)
Glucose-Capillary: 168 mg/dL — ABNORMAL HIGH (ref 70–99)
Glucose-Capillary: 117 mg/dL — ABNORMAL HIGH (ref 70–99)
Glucose-Capillary: 124 mg/dL — ABNORMAL HIGH (ref 70–99)

## 2020-04-29 NOTE — Progress Notes (Signed)
Pharmacy Antibiotic Note  Dawn Morrow is a 84 y.o. female admitted on 04/27/2020 with UTI.  Pharmacy has been consulted for aztreonam dosing (PCN allergy noted) -WBC= 9.7, afebrile, SCr= 1.29 (down), CrCL ~ 40 -blood cultures with staph epi- Likely contaminant -urine cultures with GNR  Plan: Change Aztreonam to  1000mg  IV Q8H Consider d/c azithromycin F/u renal fxn, C&S, clinical status   Height: 5\' 4"  (162.6 cm) Weight: 94.8 kg (209 lb) IBW/kg (Calculated) : 54.7  Temp (24hrs), Avg:97.9 F (36.6 C), Min:97.6 F (36.4 C), Max:98.3 F (36.8 C)  Recent Labs  Lab 04/27/20 1259 04/27/20 1310 04/27/20 1446 04/28/20 0316 04/29/20 0718  WBC 16.8*  --   --  15.1* 9.7  CREATININE 1.90* 1.80*  --  1.58* 1.29*  LATICACIDVEN 1.4  --  1.2  --   --     Estimated Creatinine Clearance: 37.5 mL/min (A) (by C-G formula based on SCr of 1.29 mg/dL (H)).    Allergies  Allergen Reactions  . Penicillins Anaphylaxis  . Sulfonylureas Other (See Comments)    Unknown reaction  . Codeine Other (See Comments)    Unknown reaction  . Sulfonamide Derivatives Other (See Comments)    Unknown reaction    Antimicrobials this admission: Aztreo 8/21>> Vanc x 1 8/21  Dose adjustments this admission: N/A  Microbiology results: 8/21 blood x2- staph epi 1/2, Likely contaminant 8/21 urine- GNR  Thank you for allowing pharmacy to be a part of this patient's care.  Hildred Laser, PharmD Clinical Pharmacist **Pharmacist phone directory can now be found on [email or web address] ([email or web address]).  Listed under [place].

## 2020-04-29 NOTE — Plan of Care (Signed)
Poc progressing.  

## 2020-04-29 NOTE — Progress Notes (Signed)
PROGRESS NOTE    [name]  WIO:[id number] DOB: 12/25/1935 DOA: 04/27/2020 PCP: Patient, No Pcp Per   Brief Narrative:  HPI on 04/27/2020 Dawn Morrow is a 84 y.o. female with a medical history of hypertension, hyperlipidemia, hypothyroidism, atrial fibrillation, history of PE and DVT, who presented to the emergency department with confusion.  She was recently admitted in July 2021 with similar presentation was found to have acute metabolic encephalopathy secondary to Enterococcus faecalis UTI, and patient was placed on vancomycin for 3 days.  Patient was discharged to nursing facility and most recently went to [facility] assisted [facility] facility on 04/23/2020.  Her niece was visiting with her this morning and noticed that she was completely altered and not aware of anything.  No further history could be obtained. Assessment & Plan   Severe sepsis secondary to UTI and possible pneumonia -Present on admission; patient presented with leukocytosis, tachycardia, acute kidney injury as well as acute metabolic encephalopathy -UA showed rare bacteria, >50 WBC, large leukocytes -Chest x-ray: Cardiomegaly without failure, increased retrocardiac markings -Urine culture pending -Blood Cultures showed no growth to date -Patient has a penicillin allergy, therefore be placed on azithromycin as well aztreonam -Of note, patient has had several UTIs in the past month and a half, there may be question of colonization however given her acute altered mental status, will treat  Acute metabolic encephalopathy -Likely multifactorial including infection versus hypercarbia -Continue to treat underlying etiology -CT head unremarkable for acute findings -Continue to monitor closely  Acute on chronic hypoxic/hypercarbic respiratory failure -Patient with history of hypoxia and uses 3 L of oxygen at baseline.  Also has a history of COPD -Currently no wheezing noted -VBG showed pCO2 62.3 (on admission) -Was placed  on BiPAP -ABG obtained on the evening of 821 showed improvement of PCO2 down to 52.1 -Continue supplemental oxygen and monitor- currently on 5L  Acute kidney injury on chronic kidney disease, stage IIIb -Baseline creatinine has been approximately 1.1-1.5 -Creatinine on admission was 1.9, however improved down to 1.29 -Continue IV fluids and monitor BMP  Hypotension -Possibly secondary to sepsis and dehydration -Per family, patient does not like to drink water -Resolved  History of PE/DVT, Chronic atrial fibrillation with RVR -Currently on Xarelto -Continue Coreg  Essential hypertension -Continue Coreg  Hypothyroidism -Continue Synthroid  COPD with chronic respiratory hypoxic failure -As above, patient uses 3 L of nasal cannula at baseline -Currently no wheezing  Diabetes mellitus, type II -hemoglobin A1c 6.1 in April 2021 -Continue Lantus at reduced dose along with insulin sliding scale and CBG monitoring  Goals of care -Discussed with niece at bedside patient's CODE STATUS.  She would like to discuss it with her brother further. -Currently remains full code  DVT Prophylaxis  Xarelto  Code Status: Full  Family Communication: None at bedside.  Message left for niece.  Disposition Plan:  Status is: Inpatient  Remains inpatient appropriate because:Altered mental status   Dispo: The patient is from: ALF              Anticipated d/c is to: TBD              Anticipated d/c date is: 3 days              Patient currently is not medically stable to d/c.   Consultants None  Procedures  None  Antibiotics   Anti-infectives (From admission, onward)   Start     Dose/Rate Route Frequency Ordered Stop   04/28/20 0000  aztreonam (AZACTAM) 0.5 g in dextrose 5 % 50 mL IVPB        0.5 g 100 mL/hr over 30 Minutes Intravenous Every 8 hours 04/27/20 1625     04/27/20 1745  aztreonam (AZACTAM) 2 g in sodium chloride 0.9 % 100 mL IVPB  Status:  Discontinued        2  g 200 mL/hr over 30 Minutes Intravenous  Once 04/27/20 1739 04/27/20 1818   04/27/20 1745  azithromycin (ZITHROMAX) 500 mg in sodium chloride 0.9 % 250 mL IVPB        500 mg 250 mL/hr over 60 Minutes Intravenous Every 24 hours 04/27/20 1739     04/27/20 1445  vancomycin (VANCOREADY) IVPB 2000 mg/400 mL        2,000 mg 200 mL/hr over 120 Minutes Intravenous  Once 04/27/20 1430 04/27/20 1829   04/27/20 1430  aztreonam (AZACTAM) 2 g in sodium chloride 0.9 % 100 mL IVPB        2 g 200 mL/hr over 30 Minutes Intravenous  Once 04/27/20 1430 04/27/20 1737      Subjective:   [name] seen and examined today.  Patient has no complaints this morning.  States she would like to continue sleeping and wants to be left alone.  She knows that she is in the hospital.  She denies chest pain or shortness of breath, abdominal pain.  Objective:   Vitals:   04/29/20 0400 04/29/20 0618 04/29/20 0822 04/29/20 1114  BP:  (!) 145/92 (!) 143/67 135/70  Pulse: (!) 141 98 87 85  Resp: 17 20 15 17   Temp:  97.6 F (36.4 C) 97.8 F (36.6 C) 98.1 F (36.7 C)  TempSrc:  Oral Oral Oral  SpO2: 96% 100% 100% 100%  Weight:      Height:        Intake/Output Summary (Last 24 hours) at 04/29/2020 1256 Last data filed at 04/29/2020 1610 Gross per 24 hour  Intake 507.58 ml  Output 1100 ml  Net -592.42 ml   Filed Weights   04/27/20 1600  Weight: 94.8 kg    Exam  General: Well developed, well nourished, elderly, NAD  HEENT: NCAT, mucous membranes moist.   Cardiovascular: S1 S2 auscultated, irregularly irregular  Respiratory: Diminished breath sounds, no wheezing   Abdomen: Soft, nontender, nondistended, + bowel sounds  Extremities: warm dry without cyanosis clubbing or edema  Neuro: AAOx3 (self, place, time, but situation), nonfocal, slight upper extremity tremor  Psych: appropriate mood and affect   Data Reviewed: I have personally reviewed following labs and imaging studies  CBC: Recent Labs   Lab 04/27/20 1259 04/27/20 1259 04/27/20 1310 04/27/20 1359 04/27/20 1708 04/28/20 0316 04/29/20 0718  WBC 16.8*  --   --   --   --  15.1* 9.7  NEUTROABS 14.4*  --   --   --   --   --   --   HGB 12.7   < > 14.3 14.3 12.6 11.7* 11.3*  HCT 42.6   < > 42.0 42.0 37.0 39.3 37.8  MCV 95.7  --   --   --   --  95.6 95.9  PLT 180  --   --   --   --  163 156   < > = values in this interval not displayed.   Basic Metabolic Panel: Recent Labs  Lab 04/27/20 1259 04/27/20 1259 04/27/20 1310 04/27/20 1359 04/27/20 1708 04/28/20 0316 04/29/20 0718  NA 141   < >  141 142 144 142 139  K 4.9   < > 5.3* 5.2* 4.6 4.3 4.5  CL 100  --  101  --   --  105 105  CO2 28  --   --   --   --  29 27  GLUCOSE 108*  --  106*  --   --  85 88  BUN 31*  --  45*  --   --  28* 20  CREATININE 1.90*  --  1.80*  --   --  1.58* 1.29*  CALCIUM 8.6*  --   --   --   --  8.4* 8.0*   < > = values in this interval not displayed.   GFR: Estimated Creatinine Clearance: 37.5 mL/min (A) (by C-G formula based on SCr of 1.29 mg/dL (H)). Liver Function Tests: Recent Labs  Lab 04/27/20 1259 04/28/20 0316  AST 19 16  ALT 21 20  ALKPHOS 70 65  BILITOT 1.0 0.8  PROT 6.6 6.4*  ALBUMIN 3.1* 2.9*   No results for input(s): LIPASE, AMYLASE in the last 168 hours. No results for input(s): AMMONIA in the last 168 hours. Coagulation Profile: Recent Labs  Lab 04/27/20 1259 04/28/20 0316  INR 1.9* 3.3*   Cardiac Enzymes: Recent Labs  Lab 04/27/20 1259  CKTOTAL 42   BNP (last 3 results) No results for input(s): PROBNP in the last 8760 hours. HbA1C: Recent Labs    04/28/20 0316  HGBA1C 6.1*   CBG: Recent Labs  Lab 04/28/20 1121 04/28/20 1754 04/28/20 2103 04/29/20 0630 04/29/20 1152  GLUCAP 80 99 96 87 168*   Lipid Profile: No results for input(s): CHOL, HDL, LDLCALC, TRIG, CHOLHDL, LDLDIRECT in the last 72 hours. Thyroid Function Tests: No results for input(s): TSH, T4TOTAL, FREET4, T3FREE, THYROIDAB  in the last 72 hours. Anemia Panel: No results for input(s): VITAMINB12, FOLATE, FERRITIN, TIBC, IRON, RETICCTPCT in the last 72 hours. Urine analysis:    Component Value Date/Time   COLORURINE AMBER (A) 04/27/2020 1303   APPEARANCEUR CLOUDY (A) 04/27/2020 1303   LABSPEC 1.018 04/27/2020 1303   PHURINE 8.0 04/27/2020 1303   GLUCOSEU NEGATIVE 04/27/2020 1303   GLUCOSEU NEGATIVE 06/12/2014 1410   HGBUR NEGATIVE 04/27/2020 1303   BILIRUBINUR NEGATIVE 04/27/2020 1303   BILIRUBINUR Neg 06/19/2016 1101   KETONESUR NEGATIVE 04/27/2020 1303   PROTEINUR 100 (A) 04/27/2020 1303   UROBILINOGEN >=8.0 06/19/2016 1101   UROBILINOGEN 0.2 05/31/2015 1835   NITRITE NEGATIVE 04/27/2020 1303   LEUKOCYTESUR LARGE (A) 04/27/2020 1303   Sepsis Labs: @LABRCNTIP (procalcitonin:4,lacticidven:4)  ) Recent Results (from the past 240 hour(s))  SARS Coronavirus 2 by RT PCR (hospital order, performed in [place] hospital lab) Nasopharyngeal Nasopharyngeal Swab     Status: None   Collection Time: 04/27/20 12:58 PM   Specimen: Nasopharyngeal Swab  Result Value Ref Range Status   SARS Coronavirus 2 NEGATIVE NEGATIVE Final    Comment: (NOTE) SARS-CoV-2 target nucleic acids are NOT DETECTED.  The SARS-CoV-2 RNA is generally detectable in upper and lower respiratory specimens during the acute phase of infection. The lowest concentration of SARS-CoV-2 viral copies this assay can detect is 250 copies / mL. A negative result does not preclude SARS-CoV-2 infection and should not be used as the sole basis for treatment or other patient management decisions.  A negative result may occur with improper specimen collection / handling, submission of specimen other than nasopharyngeal swab, presence of viral mutation(s) within the areas targeted by this assay, and inadequate  number of viral copies (<250 copies / mL). A negative result must be combined with clinical observations, patient history, and epidemiological  information.  Fact Sheet for Patients:   StrictlyIdeas.no  Fact Sheet for Healthcare Providers: BankingDealers.co.za  This test is not yet approved or  cleared by the [place] FDA and has been authorized for detection and/or diagnosis of SARS-CoV-2 by FDA under an Emergency Use Authorization (EUA).  This EUA will remain in effect (meaning this test can be used) for the duration of the COVID-19 declaration under Section 564(b)(1) of the Act, 21 U.S.C. section 360bbb-3(b)(1), unless the authorization is terminated or revoked sooner.  Performed at [facility], [place] [address] [place], [place] [address]   Culture, blood (routine x 2)     Status: None (Preliminary result)   Collection Time: 04/27/20 12:59 PM   Specimen: BLOOD RIGHT FOREARM  Result Value Ref Range Status   Specimen Description BLOOD RIGHT FOREARM  Final   Special Requests   Final    BOTTLES DRAWN AEROBIC AND ANAEROBIC Blood Culture adequate volume   Culture   Final    NO GROWTH 2 DAYS Performed at [facility], [place] [address], [place], [place] [address]    Report Status PENDING  Incomplete  Urine culture     Status: Abnormal (Preliminary result)   Collection Time: 04/27/20  1:03 PM   Specimen: Urine, Random  Result Value Ref Range Status   Specimen Description URINE, RANDOM  Final   Special Requests   Final    NONE Performed at [facility], [place] [address], [place], [place] [address]    Culture >=100,000 COLONIES/mL GRAM NEGATIVE RODS (A)  Final   Report Status PENDING  Incomplete  Culture, blood (routine x 2)     Status: Abnormal (Preliminary result)   Collection Time: 04/27/20  2:32 PM   Specimen: BLOOD  Result Value Ref Range Status   Specimen Description BLOOD SITE NOT SPECIFIED  Final   Special Requests   Final    BOTTLES DRAWN AEROBIC AND ANAEROBIC Blood Culture adequate volume   Culture  Setup Time   Final    GRAM POSITIVE COCCI  IN CLUSTERS AEROBIC BOTTLE ONLY CRITICAL RESULT CALLED TO, READ BACK BY AND VERIFIED WITH: PHARMD D PIERCE AT 2023 04/28/20 BY L BENFIELD Performed at [facility], [place] [address] [place], [place] [address]    Culture STAPHYLOCOCCUS EPIDERMIDIS (A)  Final   Report Status PENDING  Incomplete  Blood Culture ID Panel (Reflexed)     Status: Abnormal   Collection Time: 04/27/20  2:32 PM  Result Value Ref Range Status   Enterococcus faecalis NOT DETECTED NOT DETECTED Final   Enterococcus Faecium NOT DETECTED NOT DETECTED Final   Listeria monocytogenes NOT DETECTED NOT DETECTED Final   Staphylococcus species DETECTED (A) NOT DETECTED Final    Comment: CRITICAL RESULT CALLED TO, READ BACK BY AND VERIFIED WITH: PHARMD D PIERCE AT 2023 04/28/20 BY L BENFIELD    Staphylococcus aureus (BCID) NOT DETECTED NOT DETECTED Final   Staphylococcus epidermidis DETECTED (A) NOT DETECTED Final    Comment: Methicillin (oxacillin) resistant coagulase negative staphylococcus. Possible blood culture contaminant (unless isolated from more than one blood culture draw or clinical case suggests pathogenicity). No antibiotic treatment is indicated for blood  culture contaminants. CRITICAL RESULT CALLED TO, READ BACK BY AND VERIFIED WITH: PHARMD D PIERCE AT 2023 04/28/20 BY L BENFIELD    Staphylococcus lugdunensis NOT DETECTED NOT  DETECTED Final   Streptococcus species NOT DETECTED NOT DETECTED Final   Streptococcus agalactiae NOT DETECTED NOT DETECTED Final   Streptococcus pneumoniae NOT DETECTED NOT DETECTED Final   Streptococcus pyogenes NOT DETECTED NOT DETECTED Final   A.calcoaceticus-baumannii NOT DETECTED NOT DETECTED Final   Bacteroides fragilis NOT DETECTED NOT DETECTED Final   Enterobacterales NOT DETECTED NOT DETECTED Final   Enterobacter cloacae complex NOT DETECTED NOT DETECTED Final   Escherichia coli NOT DETECTED NOT DETECTED Final   Klebsiella aerogenes NOT DETECTED NOT DETECTED Final    Klebsiella oxytoca NOT DETECTED NOT DETECTED Final   Klebsiella pneumoniae NOT DETECTED NOT DETECTED Final   Proteus species NOT DETECTED NOT DETECTED Final   Salmonella species NOT DETECTED NOT DETECTED Final   Serratia marcescens NOT DETECTED NOT DETECTED Final   Haemophilus influenzae NOT DETECTED NOT DETECTED Final   Neisseria meningitidis NOT DETECTED NOT DETECTED Final   Pseudomonas aeruginosa NOT DETECTED NOT DETECTED Final   Stenotrophomonas maltophilia NOT DETECTED NOT DETECTED Final   Candida albicans NOT DETECTED NOT DETECTED Final   Candida auris NOT DETECTED NOT DETECTED Final   Candida glabrata NOT DETECTED NOT DETECTED Final   Candida krusei NOT DETECTED NOT DETECTED Final   Candida parapsilosis NOT DETECTED NOT DETECTED Final   Candida tropicalis NOT DETECTED NOT DETECTED Final   Cryptococcus neoformans/gattii NOT DETECTED NOT DETECTED Final   Methicillin resistance mecA/C DETECTED (A) NOT DETECTED Final    Comment: CRITICAL RESULT CALLED TO, READ BACK BY AND VERIFIED WITH: PHARMD D PIERCE AT 2023 04/28/20 BY L BENFIELD Performed at [facility], [address] [place], [place] [address]   MRSA PCR Screening     Status: None   Collection Time: 04/28/20  4:01 AM   Specimen: Nasopharyngeal  Result Value Ref Range Status   MRSA by PCR NEGATIVE NEGATIVE Final    Comment:        The GeneXpert MRSA Assay (FDA approved for NASAL specimens only), is one component of a comprehensive MRSA colonization surveillance program. It is not intended to diagnose MRSA infection nor to guide or monitor treatment for MRSA infections. Performed at [facility], [place] [address] [place], [place] [address]       Radiology Studies: CT Head Wo Contrast  Result Date: 04/27/2020 CLINICAL DATA:  84 year old female with altered mental status and confusion. EXAM: CT HEAD WITHOUT CONTRAST TECHNIQUE: Contiguous axial images were obtained from the base of the skull through the  vertex without intravenous contrast. COMPARISON:  03/26/2020 CT and prior studies FINDINGS: Brain: No evidence of acute infarction, hemorrhage, hydrocephalus, extra-axial collection or mass lesion/mass effect. Atrophy, chronic small-vessel white matter ischemic changes and remote bilateral basal ganglia infarcts are again noted. Vascular: Carotid atherosclerotic calcifications are noted. Skull: Normal. Negative for fracture or focal lesion. Sinuses/Orbits: No acute finding. Other: None. IMPRESSION: 1. No evidence of acute intracranial abnormality. 2. Atrophy, chronic small-vessel white matter ischemic changes and remote bilateral basal ganglia infarcts. Electronically Signed   By: Margarette Canada M.D.   On: 04/27/2020 14:50   DG Chest Port 1 View  Result Date: 04/27/2020 CLINICAL DATA:  New onset confusion EXAM: PORTABLE CHEST 1 VIEW COMPARISON:  03/31/2019 FINDINGS: Chronic cardiomegaly. Extensive artifact from EKG leads. Low volume chest with interstitial coarsening, chronic. No edema, effusion, or pneumothorax. IMPRESSION: 1. Limited portable chest. 2. Cardiomegaly without failure. 3. Increased retrocardiac markings but improved from 03/30/2020 chest x-ray Electronically Signed   By: Neva Seat.D.  On: 04/27/2020 14:03     Scheduled Meds: . atorvastatin  10 mg Oral QHS  . buPROPion  450 mg Oral Daily  . carvedilol  6.25 mg Oral BID WC  . docusate sodium  100 mg Oral Daily  . ezetimibe  10 mg Oral Daily  . gabapentin  300 mg Oral BID WC  . gabapentin  600 mg Oral QHS  . insulin aspart  0-5 Units Subcutaneous QHS  . insulin aspart  0-9 Units Subcutaneous TID WC  . insulin glargine  7 Units Subcutaneous QHS  . levothyroxine  88 mcg Oral QAC breakfast  . rivaroxaban  20 mg Oral QPM  . sertraline  75 mg Oral Daily   Continuous Infusions: . sodium chloride 75 mL/hr at 04/28/20 1612  . azithromycin 500 mg (04/28/20 1738)  . aztreonam 0.5 g (04/29/20 0901)     LOS: 2 days   Time Spent  in minutes   30 minutes  Kiamesha Samet D.O. on 04/29/2020 at 12:56 PM  Between 7am to 7pm - Please see pager noted on [email or web address]  After 7pm go to [email or web address]  And look for the night coverage person covering for me after hours  [facility] Office  [phone]

## 2020-04-30 LAB — URINE CULTURE: Culture: >=100000 — AB

## 2020-04-30 LAB — BASIC METABOLIC PANEL
Anion gap: 8 (ref 5–15)
BUN: 16 mg/dL (ref 8–23)
CO2: 26 mmol/L (ref 22–32)
Calcium: 8.1 mg/dL — ABNORMAL LOW (ref 8.9–10.3)
Chloride: 107 mmol/L (ref 98–111)
Creatinine, Ser: 1.07 mg/dL — ABNORMAL HIGH (ref 0.44–1.00)
GFR calc Af Amer: 56 mL/min — ABNORMAL LOW (ref >60)
GFR calc non Af Amer: 48 mL/min — ABNORMAL LOW (ref >60)
Glucose, Bld: 81 mg/dL (ref 70–99)
Potassium: 4.3 mmol/L (ref 3.5–5.1)
Sodium: 141 mmol/L (ref 135–145)

## 2020-04-30 LAB — GLUCOSE, CAPILLARY
Glucose-Capillary: 70 mg/dL (ref 70–99)
Glucose-Capillary: 81 mg/dL (ref 70–99)
Glucose-Capillary: 95 mg/dL (ref 70–99)
Glucose-Capillary: 131 mg/dL — ABNORMAL HIGH (ref 70–99)

## 2020-04-30 LAB — CULTURE, BLOOD (ROUTINE X 2): Special Requests: ADEQUATE

## 2020-04-30 NOTE — Evaluation (Addendum)
I agree with the following treatment note after review of the documentation. This session was performed under the supervision of a licensed clinician.   Leighton Ruff, PT, DPT  Acute Rehabilitation Services  Pager: [phone]  Physical Therapy Evaluation Patient Details Name: Dawn Morrow MRN: [id number] DOB: 10-25-1935 Today's Date: 04/30/2020   History of Present Illness  Pt is an 84 y/o female admitted secondary to acute metabolic encephalopathy; found to have UTI. Pt was noted to have AMS upon arrival to ED. CT head is negative for acute abnormality. PMH includes DM, HTN, COPD, PE, DVT, CKD, and A-fib. Pt had recent placement in hospital due to similar presentation.   Clinical Impression  Pt was evaluated for above diagnosis and impairments below. Pt required min assist with bed mobility and mod to max assist with transfers to standing. Noted to have AMS in session so unsure of baseline. Feel if ALF able to provide necessary assist would benefit from HHPT at ALF, however, if they are unable to provide assist, pt will likely require SNF level therapies. Pt would continue to benefit from acute therapy in order to ensure her safety with functional tasks and increase independence with mobility. Will continue to follow acutely.     Follow Up Recommendations SNF (unless ALF can provide necessary support)    Equipment Recommendations  None recommended by PT    Recommendations for Other Services OT consult     Precautions / Restrictions Precautions Precautions: Fall Restrictions Weight Bearing Restrictions: No      Mobility  Bed Mobility Overal bed mobility: Needs Assistance Bed Mobility: Rolling;Sidelying to Sit;Sit to Sidelying Rolling: Min assist Sidelying to sit: Min assist;HOB elevated     Sit to sidelying: Min assist General bed mobility comments: pt required verbal cueing and min assist for LEs and trunk with bed mobility with use of bed railings.    Transfers Overall transfer level: Needs assistance Equipment used: None Transfers: Sit to/from Stand Sit to Stand: Mod assist         General transfer comment: pt require mod assist with sit<>Stand transfer for lift assist. Pt got to standing and required max assist to maintain standing for <30 seconds. PT stood in front of pt and had pt hold to PT arms.   Ambulation/Gait                Stairs            Wheelchair Mobility    Modified Rankin (Stroke Patients Only)       Balance Overall balance assessment: Needs assistance Sitting-balance support: Single extremity supported;Feet supported Sitting balance-Leahy Scale: Poor Sitting balance - Comments: pt requires UE support with sitting balance.    Standing balance support: Bilateral upper extremity supported Standing balance-Leahy Scale: Poor Standing balance comment: pt was reliant on therapist support and at least 1 UE support. Required max assist in standing                   Pertinent Vitals/Pain Pain Assessment: Faces Faces Pain Scale: Hurts a little bit Pain Location: L knee Pain Descriptors / Indicators: Grimacing Pain Intervention(s): Limited activity within patient's tolerance;Monitored during session;Repositioned    Home Living Family/patient expects to be discharged to:: Assisted living               Home Equipment: Wheelchair - [employer] - 2 wheels Additional Comments: From [facility], pt reports she used RW and w/c but limited with history given cognitive deficits  Prior Function Level of Independence: Needs assistance   Gait / Transfers Assistance Needed: Reports she used WC and RW, but was unsure if she required assist. Unsure of accuracy given cognitive deficits.   ADL's / Homemaking Assistance Needed: reports she can bath and dress herself, however, unsure of accuracy given cognitive deficits.   Comments: Pt unable to provide detailed history due to AMS and no  family present to clarify     Hand Dominance   Dominant Hand: Right    Extremity/Trunk Assessment   Upper Extremity Assessment Upper Extremity Assessment: Defer to OT evaluation    Lower Extremity Assessment Lower Extremity Assessment: Generalized weakness    Cervical / Trunk Assessment Cervical / Trunk Assessment: Kyphotic  Communication   Communication: HOH  Cognition Arousal/Alertness: Awake/alert Behavior During Therapy: WFL for tasks assessed/performed Overall Cognitive Status: Impaired/Different from baseline Area of Impairment: Orientation;Attention;Following commands;Memory;Awareness                 Orientation Level: Disoriented to;Place;Time Current Attention Level: Sustained Memory: Decreased short-term memory Following Commands: Follows one step commands with increased time   Awareness: Intellectual   General Comments: pt required redirection to tasks as she was reaching for lines and leads. Pt required increase time with simple command following. No family present to determine baseline cognition. Pt only able to describe why she was here and who she was.       General Comments      Exercises     Assessment/Plan    PT Assessment Patient needs continued PT services  PT Problem List Decreased strength;Decreased range of motion;Decreased activity tolerance;Decreased balance;Decreased mobility;Decreased coordination;Decreased cognition;Decreased knowledge of use of DME;Decreased safety awareness;Pain       PT Treatment Interventions DME instruction;Stair training;Gait training;Functional mobility training;Therapeutic activities;Therapeutic exercise;Balance training;Neuromuscular re-education;Cognitive remediation;Patient/family education;Wheelchair mobility training    PT Goals (Current goals can be found in the Care Plan section)  Acute Rehab PT Goals Patient Stated Goal: none stated PT Goal Formulation: Patient unable to participate in goal  setting Time For Goal Achievement: 05/14/20 Potential to Achieve Goals: Fair    Frequency Min 2X/week   Barriers to discharge        Co-evaluation               AM-PAC PT "6 Clicks" Mobility  Outcome Measure Help needed turning from your back to your side while in a flat bed without using bedrails?: A Little Help needed moving from lying on your back to sitting on the side of a flat bed without using bedrails?: A Little Help needed moving to and from a bed to a chair (including a wheelchair)?: A Lot Help needed standing up from a chair using your arms (e.g., wheelchair or bedside chair)?: A Lot Help needed to walk in hospital room?: Total Help needed climbing 3-5 steps with a railing? : Total 6 Click Score: 12    End of Session Equipment Utilized During Treatment: Gait belt;Oxygen (4L) Activity Tolerance: Patient tolerated treatment well Patient left: in bed;with call bell/phone within reach;with bed alarm set;with nursing/sitter in room Nurse Communication: Mobility status PT Visit Diagnosis: Unsteadiness on feet (R26.81);Other abnormalities of gait and mobility (R26.89);Muscle weakness (generalized) (M62.81);Difficulty in walking, not elsewhere classified (R26.2);Pain Pain - Right/Left: Left Pain - part of body: Knee    Time: 8101-7510 PT Time Calculation (min) (ACUTE ONLY): 26 min   Charges:   PT Evaluation $PT Eval Moderate Complexity: 1 Mod PT Treatments $Therapeutic Activity: 8-22 mins  Gloriann Loan, SPT  Acute Rehabilitation Services  Office: [phone]  04/30/2020, 5:49 PM

## 2020-04-30 NOTE — Progress Notes (Signed)
PROGRESS NOTE    Dawn Morrow  TSV:[id number] DOB: [birth year] DOA: 04/27/2020 PCP: Patient, No Pcp Per   Brief Narrative:  HPI on 04/27/2020 Dawn Morrow is a 84 y.o. female with a medical history of hypertension, hyperlipidemia, hypothyroidism, atrial fibrillation, history of PE and DVT, who presented to the emergency department with confusion.  She was recently admitted in July 2021 with similar presentation was found to have acute metabolic encephalopathy secondary to Enterococcus faecalis UTI, and patient was placed on vancomycin for 3 days.  Patient was discharged to nursing facility and most recently went to [facility] on 04/23/2020.  Her niece was visiting with her this morning and noticed that she was completely altered and not aware of anything.  No further history could be obtained.  Interim history Admitted with sepsis secondary to UTI and possible pneumonia, currently on antibiotics. Continues to be mildly altered.  Assessment & Plan   Severe sepsis secondary to UTI and possible pneumonia -Present on admission; patient presented with leukocytosis, tachycardia, acute kidney injury as well as acute metabolic encephalopathy -UA showed rare bacteria, >50 WBC, large leukocytes -Chest x-ray: Cardiomegaly without failure, increased retrocardiac markings -Urine culture >100k proteus mirabilis, resistant to Cipro, nitrofurantoin, Bactrim -Blood Cultures 1/2 staph epidermidis, likely contaminant -Patient has a penicillin allergy, therefore be placed on azithromycin as well aztreonam -Of note, patient has had several UTIs in the past month and a half, there may be question of colonization however given her acute altered mental status, will treat -of note she had Enterococcus UTI in July 2021, Ecoli April 9233  Acute metabolic encephalopathy -Likely multifactorial including infection versus hypercarbia -Continue to treat underlying etiology -CT head unremarkable for  acute findings -Continue to monitor closely  Acute on chronic hypoxic/hypercarbic respiratory failure -Patient with history of hypoxia and uses 3 L of oxygen at baseline.  Also has a history of COPD -Currently no wheezing noted -VBG showed pCO2 62.3 (on admission) -Was placed on BiPAP -ABG obtained on the evening of 04/27/20 showed improvement of PCO2 down to 52.1 -Continue supplemental oxygen and monitor- currently down to 4L  Acute kidney injury on chronic kidney disease, stage IIIb -Baseline creatinine has been approximately 1.1-1.5 -Creatinine on admission was 1.9, however improved down to 1.07 -Continue IV fluids and monitor BMP  Hypotension -Possibly secondary to sepsis and dehydration -Per family, patient does not like to drink water -Resolved  History of PE/DVT, Chronic atrial fibrillation with RVR -Currently on Xarelto -Continue Coreg  Essential hypertension -Continue Coreg  Hypothyroidism -Continue Synthroid  COPD with chronic respiratory hypoxic failure -As above, patient uses 3 L of nasal cannula at baseline -Currently no wheezing  Diabetes mellitus, type II -hemoglobin A1c 6.1 in April 2021 -Continue Lantus at reduced dose along with insulin sliding scale and CBG monitoring  Goals of care -Discussed with niece at bedside patient's CODE STATUS.  She would like to discuss it with her brother further. -Currently remains full code  DVT Prophylaxis  Xarelto  Code Status: Full  Family Communication: None at bedside.  Message left for niece.  Disposition Plan:  Status is: Inpatient  Remains inpatient appropriate because:Altered mental status   Dispo: The patient is from: ALF              Anticipated d/c is to: TBD              Anticipated d/c date is: 3 days  Patient currently is not medically stable to d/c.   Consultants None  Procedures  None  Antibiotics   Anti-infectives (From admission, onward)   Start     Dose/Rate  Route Frequency Ordered Stop   04/28/20 0000  aztreonam (AZACTAM) 0.5 g in dextrose 5 % 50 mL IVPB        0.5 g 100 mL/hr over 30 Minutes Intravenous Every 8 hours 04/27/20 1625     04/27/20 1745  aztreonam (AZACTAM) 2 g in sodium chloride 0.9 % 100 mL IVPB  Status:  Discontinued        2 g 200 mL/hr over 30 Minutes Intravenous  Once 04/27/20 1739 04/27/20 1818   04/27/20 1745  azithromycin (ZITHROMAX) 500 mg in sodium chloride 0.9 % 250 mL IVPB        500 mg 250 mL/hr over 60 Minutes Intravenous Every 24 hours 04/27/20 1739     04/27/20 1445  vancomycin (VANCOREADY) IVPB 2000 mg/400 mL        2,000 mg 200 mL/hr over 120 Minutes Intravenous  Once 04/27/20 1430 04/27/20 1829   04/27/20 1430  aztreonam (AZACTAM) 2 g in sodium chloride 0.9 % 100 mL IVPB        2 g 200 mL/hr over 30 Minutes Intravenous  Once 04/27/20 1430 04/27/20 1737      Subjective:   Dawn Morrow seen and examined today.  Patient wants to be left alone to sleep. Her only complaints is that people keep waking her up. Denies chest pain, shortness of breath, abdominal pain, N/V/D/C.   Objective:   Vitals:   04/29/20 2308 04/30/20 0403 04/30/20 0735 04/30/20 1107  BP: 138/90 (!) 159/95 (!) 140/109 (!) 133/91  Pulse: 79 73 (!) 102 89  Resp: 20 16 17 20   Temp: 98.1 F (36.7 C) 97.6 F (36.4 C) 98.3 F (36.8 C) (!) 97.5 F (36.4 C)  TempSrc: Oral Oral Axillary Oral  SpO2: 95% 95% 94% 93%  Weight:      Height:        Intake/Output Summary (Last 24 hours) at 04/30/2020 1437 Last data filed at 04/30/2020 0354 Gross per 24 hour  Intake 3266.63 ml  Output 1100 ml  Net 2166.63 ml   Filed Weights   04/27/20 1600  Weight: 94.8 kg    Exam  General: Well developed, well nourished, elderly, NAD  HEENT: NCAT, mucous membranes moist.   Cardiovascular: S1 S2 auscultated, irregularly irregular  Respiratory: Diminished breath sounds, no wheezing   Abdomen: Soft, nontender, nondistended, + bowel  sounds  Extremities: warm dry without cyanosis clubbing or edema  Neuro: AAOx3 (self, place, time, but situation), nonfocal  Psych: appropriate mood and affect   Data Reviewed: I have personally reviewed following labs and imaging studies  CBC: Recent Labs  Lab 04/27/20 1259 04/27/20 1259 04/27/20 1310 04/27/20 1359 04/27/20 1708 04/28/20 0316 04/29/20 0718  WBC 16.8*  --   --   --   --  15.1* 9.7  NEUTROABS 14.4*  --   --   --   --   --   --   HGB 12.7   < > 14.3 14.3 12.6 11.7* 11.3*  HCT 42.6   < > 42.0 42.0 37.0 39.3 37.8  MCV 95.7  --   --   --   --  95.6 95.9  PLT 180  --   --   --   --  163 156   < > = values in this interval not  displayed.   Basic Metabolic Panel: Recent Labs  Lab 04/27/20 1259 04/27/20 1259 04/27/20 1310 04/27/20 1310 04/27/20 1359 04/27/20 1708 04/28/20 0316 04/29/20 0718 04/30/20 0321  NA 141   < > 141   < > 142 144 142 139 141  K 4.9   < > 5.3*   < > 5.2* 4.6 4.3 4.5 4.3  CL 100  --  101  --   --   --  105 105 107  CO2 28  --   --   --   --   --  29 27 26   GLUCOSE 108*  --  106*  --   --   --  85 88 81  BUN 31*  --  45*  --   --   --  28* 20 16  CREATININE 1.90*  --  1.80*  --   --   --  1.58* 1.29* 1.07*  CALCIUM 8.6*  --   --   --   --   --  8.4* 8.0* 8.1*   < > = values in this interval not displayed.   GFR: Estimated Creatinine Clearance: 45.2 mL/min (A) (by C-G formula based on SCr of 1.07 mg/dL (H)). Liver Function Tests: Recent Labs  Lab 04/27/20 1259 04/28/20 0316  AST 19 16  ALT 21 20  ALKPHOS 70 65  BILITOT 1.0 0.8  PROT 6.6 6.4*  ALBUMIN 3.1* 2.9*   No results for input(s): LIPASE, AMYLASE in the last 168 hours. No results for input(s): AMMONIA in the last 168 hours. Coagulation Profile: Recent Labs  Lab 04/27/20 1259 04/28/20 0316  INR 1.9* 3.3*   Cardiac Enzymes: Recent Labs  Lab 04/27/20 1259  CKTOTAL 42   BNP (last 3 results) No results for input(s): PROBNP in the last 8760  hours. HbA1C: Recent Labs    04/28/20 0316  HGBA1C 6.1*   CBG: Recent Labs  Lab 04/29/20 1152 04/29/20 1607 04/29/20 2127 04/30/20 0611 04/30/20 1118  GLUCAP 168* 117* 124* 70 81   Lipid Profile: No results for input(s): CHOL, HDL, LDLCALC, TRIG, CHOLHDL, LDLDIRECT in the last 72 hours. Thyroid Function Tests: No results for input(s): TSH, T4TOTAL, FREET4, T3FREE, THYROIDAB in the last 72 hours. Anemia Panel: No results for input(s): VITAMINB12, FOLATE, FERRITIN, TIBC, IRON, RETICCTPCT in the last 72 hours. Urine analysis:    Component Value Date/Time   COLORURINE AMBER (A) 04/27/2020 1303   APPEARANCEUR CLOUDY (A) 04/27/2020 1303   LABSPEC 1.018 04/27/2020 1303   PHURINE 8.0 04/27/2020 1303   GLUCOSEU NEGATIVE 04/27/2020 1303   GLUCOSEU NEGATIVE 06/12/2014 1410   HGBUR NEGATIVE 04/27/2020 1303   BILIRUBINUR NEGATIVE 04/27/2020 1303   BILIRUBINUR Neg 06/19/2016 1101   KETONESUR NEGATIVE 04/27/2020 1303   PROTEINUR 100 (A) 04/27/2020 1303   UROBILINOGEN >=8.0 06/19/2016 1101   UROBILINOGEN 0.2 05/31/2015 1835   NITRITE NEGATIVE 04/27/2020 1303   LEUKOCYTESUR LARGE (A) 04/27/2020 1303   Sepsis Labs: @LABRCNTIP (procalcitonin:4,lacticidven:4)  ) Recent Results (from the past 240 hour(s))  SARS Coronavirus 2 by RT PCR (hospital order, performed in [facility] hospital lab) Nasopharyngeal Nasopharyngeal Swab     Status: None   Collection Time: 04/27/20 12:58 PM   Specimen: Nasopharyngeal Swab  Result Value Ref Range Status   SARS Coronavirus 2 NEGATIVE NEGATIVE Final    Comment: (NOTE) SARS-CoV-2 target nucleic acids are NOT DETECTED.  The SARS-CoV-2 RNA is generally detectable in upper and lower respiratory specimens during the acute phase of infection. The lowest concentration  of SARS-CoV-2 viral copies this assay can detect is 250 copies / mL. A negative result does not preclude SARS-CoV-2 infection and should not be used as the sole basis for treatment or  other patient management decisions.  A negative result may occur with improper specimen collection / handling, submission of specimen other than nasopharyngeal swab, presence of viral mutation(s) within the areas targeted by this assay, and inadequate number of viral copies (<250 copies / mL). A negative result must be combined with clinical observations, patient history, and epidemiological information.  Fact Sheet for Patients:   StrictlyIdeas.no  Fact Sheet for Healthcare Providers: BankingDealers.co.za  This test is not yet approved or  cleared by the [place] FDA and has been authorized for detection and/or diagnosis of SARS-CoV-2 by FDA under an Emergency Use Authorization (EUA).  This EUA will remain in effect (meaning this test can be used) for the duration of the COVID-19 declaration under Section 564(b)(1) of the Act, 21 U.S.C. section 360bbb-3(b)(1), unless the authorization is terminated or revoked sooner.  Performed at [facility], [place] [address], [place], [place] [address]   Culture, blood (routine x 2)     Status: None (Preliminary result)   Collection Time: 04/27/20 12:59 PM   Specimen: BLOOD RIGHT FOREARM  Result Value Ref Range Status   Specimen Description BLOOD RIGHT FOREARM  Final   Special Requests   Final    BOTTLES DRAWN AEROBIC AND ANAEROBIC Blood Culture adequate volume   Culture   Final    NO GROWTH 3 DAYS Performed at [facility], [place] [address], [place], [place] [address]    Report Status PENDING  Incomplete  Urine culture     Status: Abnormal   Collection Time: 04/27/20  1:03 PM   Specimen: Urine, Random  Result Value Ref Range Status   Specimen Description URINE, RANDOM  Final   Special Requests   Final    NONE Performed at [facility], [place] [address], [place], [place] [address]    Culture >=100,000 COLONIES/mL PROTEUS MIRABILIS (A)  Final   Report Status 04/30/2020  FINAL  Final   Organism ID, Bacteria PROTEUS MIRABILIS (A)  Final      Susceptibility   Proteus mirabilis - MIC*    AMPICILLIN <=2 SENSITIVE Sensitive     CEFAZOLIN <=4 SENSITIVE Sensitive     CEFTRIAXONE <=0.25 SENSITIVE Sensitive     CIPROFLOXACIN >=4 RESISTANT Resistant     GENTAMICIN <=1 SENSITIVE Sensitive     IMIPENEM 2 SENSITIVE Sensitive     NITROFURANTOIN 128 RESISTANT Resistant     TRIMETH/SULFA >=320 RESISTANT Resistant     AMPICILLIN/SULBACTAM <=2 SENSITIVE Sensitive     PIP/TAZO <=4 SENSITIVE Sensitive     * >=100,000 COLONIES/mL PROTEUS MIRABILIS  Culture, blood (routine x 2)     Status: Abnormal   Collection Time: 04/27/20  2:32 PM   Specimen: BLOOD  Result Value Ref Range Status   Specimen Description BLOOD SITE NOT SPECIFIED  Final   Special Requests   Final    BOTTLES DRAWN AEROBIC AND ANAEROBIC Blood Culture adequate volume   Culture  Setup Time   Final    GRAM POSITIVE COCCI IN CLUSTERS AEROBIC BOTTLE ONLY CRITICAL RESULT CALLED TO, READ BACK BY AND VERIFIED WITH: PHARMD D PIERCE AT 2023 04/28/20 BY L BENFIELD    Culture (A)  Final    STAPHYLOCOCCUS EPIDERMIDIS THE SIGNIFICANCE OF ISOLATING THIS ORGANISM FROM A SINGLE SET OF BLOOD  CULTURES WHEN MULTIPLE SETS ARE DRAWN IS UNCERTAIN. PLEASE NOTIFY THE MICROBIOLOGY DEPARTMENT WITHIN ONE WEEK IF SPECIATION AND SENSITIVITIES ARE REQUIRED. Performed at [facility], [place] [address] [place], [place] [address]    Report Status 04/30/2020 FINAL  Final  Blood Culture ID Panel (Reflexed)     Status: Abnormal   Collection Time: 04/27/20  2:32 PM  Result Value Ref Range Status   Enterococcus faecalis NOT DETECTED NOT DETECTED Final   Enterococcus Faecium NOT DETECTED NOT DETECTED Final   Listeria monocytogenes NOT DETECTED NOT DETECTED Final   Staphylococcus species DETECTED (A) NOT DETECTED Final    Comment: CRITICAL RESULT CALLED TO, READ BACK BY AND VERIFIED WITH: PHARMD D PIERCE AT 2023 04/28/20 BY L BENFIELD     Staphylococcus aureus (BCID) NOT DETECTED NOT DETECTED Final   Staphylococcus epidermidis DETECTED (A) NOT DETECTED Final    Comment: Methicillin (oxacillin) resistant coagulase negative staphylococcus. Possible blood culture contaminant (unless isolated from more than one blood culture draw or clinical case suggests pathogenicity). No antibiotic treatment is indicated for blood  culture contaminants. CRITICAL RESULT CALLED TO, READ BACK BY AND VERIFIED WITH: PHARMD D PIERCE AT 2023 04/28/20 BY L BENFIELD    Staphylococcus lugdunensis NOT DETECTED NOT DETECTED Final   Streptococcus species NOT DETECTED NOT DETECTED Final   Streptococcus agalactiae NOT DETECTED NOT DETECTED Final   Streptococcus pneumoniae NOT DETECTED NOT DETECTED Final   Streptococcus pyogenes NOT DETECTED NOT DETECTED Final   A.calcoaceticus-baumannii NOT DETECTED NOT DETECTED Final   Bacteroides fragilis NOT DETECTED NOT DETECTED Final   Enterobacterales NOT DETECTED NOT DETECTED Final   Enterobacter cloacae complex NOT DETECTED NOT DETECTED Final   Escherichia coli NOT DETECTED NOT DETECTED Final   Klebsiella aerogenes NOT DETECTED NOT DETECTED Final   Klebsiella oxytoca NOT DETECTED NOT DETECTED Final   Klebsiella pneumoniae NOT DETECTED NOT DETECTED Final   Proteus species NOT DETECTED NOT DETECTED Final   Salmonella species NOT DETECTED NOT DETECTED Final   Serratia marcescens NOT DETECTED NOT DETECTED Final   Haemophilus influenzae NOT DETECTED NOT DETECTED Final   Neisseria meningitidis NOT DETECTED NOT DETECTED Final   Pseudomonas aeruginosa NOT DETECTED NOT DETECTED Final   Stenotrophomonas maltophilia NOT DETECTED NOT DETECTED Final   Candida albicans NOT DETECTED NOT DETECTED Final   Candida auris NOT DETECTED NOT DETECTED Final   Candida glabrata NOT DETECTED NOT DETECTED Final   Candida krusei NOT DETECTED NOT DETECTED Final   Candida parapsilosis NOT DETECTED NOT DETECTED Final   Candida tropicalis  NOT DETECTED NOT DETECTED Final   Cryptococcus neoformans/gattii NOT DETECTED NOT DETECTED Final   Methicillin resistance mecA/C DETECTED (A) NOT DETECTED Final    Comment: CRITICAL RESULT CALLED TO, READ BACK BY AND VERIFIED WITH: PHARMD D PIERCE AT 2023 04/28/20 BY L BENFIELD Performed at [facility], [address], [place], [place] [address]   MRSA PCR Screening     Status: None   Collection Time: 04/28/20  4:01 AM   Specimen: Nasopharyngeal  Result Value Ref Range Status   MRSA by PCR NEGATIVE NEGATIVE Final    Comment:        The GeneXpert MRSA Assay (FDA approved for NASAL specimens only), is one component of a comprehensive MRSA colonization surveillance program. It is not intended to diagnose MRSA infection nor to guide or monitor treatment for MRSA infections. Performed at [facility], [place] [address] [place], [place] [address]  Radiology Studies: No results found.   Scheduled Meds: . atorvastatin  10 mg Oral QHS  . buPROPion  450 mg Oral Daily  . carvedilol  6.25 mg Oral BID WC  . docusate sodium  100 mg Oral Daily  . ezetimibe  10 mg Oral Daily  . gabapentin  300 mg Oral BID WC  . gabapentin  600 mg Oral QHS  . insulin aspart  0-5 Units Subcutaneous QHS  . insulin aspart  0-9 Units Subcutaneous TID WC  . insulin glargine  7 Units Subcutaneous QHS  . levothyroxine  88 mcg Oral QAC breakfast  . rivaroxaban  20 mg Oral QPM  . sertraline  75 mg Oral Daily   Continuous Infusions: . sodium chloride 75 mL/hr at 04/30/20 0618  . azithromycin 500 mg (04/29/20 1647)  . aztreonam 0.5 g (04/30/20 0822)     LOS: 3 days   Time Spent in minutes   30 minutes  Nevaan Bunton D.O. on 04/30/2020 at 2:37 PM  Between 7am to 7pm - Please see pager noted on [email or web address]  After 7pm go to [email or web address]  And look for the night coverage person covering for me after hours  [facility] Office  [phone]

## 2020-05-01 LAB — BASIC METABOLIC PANEL
Anion gap: 9 (ref 5–15)
BUN: 15 mg/dL (ref 8–23)
CO2: 25 mmol/L (ref 22–32)
Calcium: 8.3 mg/dL — ABNORMAL LOW (ref 8.9–10.3)
Chloride: 107 mmol/L (ref 98–111)
Creatinine, Ser: 1.15 mg/dL — ABNORMAL HIGH (ref 0.44–1.00)
GFR calc Af Amer: 51 mL/min — ABNORMAL LOW (ref >60)
GFR calc non Af Amer: 44 mL/min — ABNORMAL LOW (ref >60)
Glucose, Bld: 110 mg/dL — ABNORMAL HIGH (ref 70–99)
Potassium: 4 mmol/L (ref 3.5–5.1)
Sodium: 141 mmol/L (ref 135–145)

## 2020-05-01 LAB — CBC
HCT: 36.3 % (ref 36.0–46.0)
Hemoglobin: 11.1 g/dL — ABNORMAL LOW (ref 12.0–15.0)
MCH: 29.4 pg (ref 26.0–34.0)
MCHC: 30.6 g/dL (ref 30.0–36.0)
MCV: 96 fL (ref 80.0–100.0)
Platelets: 171 10*3/uL (ref 150–400)
RBC: 3.78 MIL/uL — ABNORMAL LOW (ref 3.87–5.11)
RDW: 16.8 % — ABNORMAL HIGH (ref 11.5–15.5)
WBC: 8.1 10*3/uL (ref 4.0–10.5)
nRBC: 0 % (ref 0.0–0.2)

## 2020-05-01 LAB — GLUCOSE, CAPILLARY
Glucose-Capillary: 99 mg/dL (ref 70–99)
Glucose-Capillary: 127 mg/dL — ABNORMAL HIGH (ref 70–99)
Glucose-Capillary: 122 mg/dL — ABNORMAL HIGH (ref 70–99)
Glucose-Capillary: 151 mg/dL — ABNORMAL HIGH (ref 70–99)

## 2020-05-01 MED ORDER — AZITHROMYCIN 500 MG PO TABS
500.0000 mg | ORAL_TABLET | Freq: Every day | ORAL | Status: AC
  Administered 2020-05-01: 500 mg via ORAL
  Filled 2020-05-01: qty 1

## 2020-05-01 NOTE — Evaluation (Signed)
Occupational Therapy Evaluation Patient Details Name: Dawn Morrow MRN: [id number] DOB: 07-28-1936 Today's Date: 05/01/2020    History of Present Illness Pt is an 84 y/o female admitted secondary to acute metabolic encephalopathy; found to have UTI. Pt was noted to have AMS upon arrival to ED. CT head is negative for acute abnormality. PMH includes DM, HTN, COPD, PE, DVT, CKD, and A-fib. Pt had recent placement in hospital due to similar presentation.    Clinical Impression   Pt from ALF, unable to confirm baseline assist needed, and no family present currently. Very HOH, and currently requires 3L of O2 or drops down into the 70's. Even with 3L O2 desaturates with activity to the low 80's. Today Pt was very pleasant and cooperative and was able to perform stand pivot transfer to [facility], max A for donning LB mesh undies, min guard assist for transfers due to balance and line management. Reports fatigue and feeling "shakey" after 2 transfers. She is incontinent at baseline and typically does wear an adult diaper/depends. OT will follow acutely for improvement in cognition (attention/safety) and if ALF is unable to provide additional support for ADL and safety with transfers will require SNF.     Follow Up Recommendations  SNF;Home health OT    Equipment Recommendations  None recommended by OT (Pt has access to appropriate DME)    Recommendations for Other Services       Precautions / Restrictions Precautions Precautions: Fall Restrictions Weight Bearing Restrictions: No      Mobility Bed Mobility Overal bed mobility: Needs Assistance Bed Mobility: Rolling;Sidelying to Sit Rolling: Supervision Sidelying to sit: Min guard       General bed mobility comments: min guard throughout bed mobility- no line awareness  Transfers Overall transfer level: Needs assistance Equipment used: None;Rolling walker (2 wheeled) Transfers: Sit to/from [facility] Sit to Stand: Min  guard;From elevated surface Stand pivot transfers: Min guard;From elevated surface       General transfer comment: cues for safe hand placement Pt stating "I've got this, don't get in my way" but allowing close guard for safety and line management    Balance Overall balance assessment: Needs assistance Sitting-balance support: Feet supported Sitting balance-Leahy Scale: Fair     Standing balance support: Bilateral upper extremity supported Standing balance-Leahy Scale: Fair Standing balance comment: reliant on at least SUE support for balance in standing                           ADL either performed or assessed with clinical judgement   ADL Overall ADL's : Needs assistance/impaired Eating/Feeding: Set up;Sitting   Grooming: Minimal assistance;Sitting Grooming Details (indicate cue type and reason): redirection cues Upper Body Bathing: Minimal assistance;Sitting   Lower Body Bathing: Moderate assistance;Sitting/lateral leans   Upper Body Dressing : Minimal assistance;Sitting   Lower Body Dressing: Moderate assistance;Sitting/lateral leans   Toilet Transfer: Min Designer, jewellery Details (indicate cue type and reason): cues for safety with line management Toileting- Clothing Manipulation and Hygiene: Maximal assistance;Sit to/from stand Toileting - Clothing Manipulation Details (indicate cue type and reason): able to stand with RW for balance and OT performed peri care. Pt typically wears a "depends" due to incontinence     Functional mobility during ADLs: Min guard;Minimal assistance;Cueing for safety General ADL Comments: cues for safety and attention throughout ADL tasks     Vision         Perception     Praxis  Pertinent Vitals/Pain Pain Assessment: No/denies pain Faces Pain Scale: Hurts a little bit Pain Intervention(s): Monitored during session     Hand Dominance Right   Extremity/Trunk Assessment Upper Extremity  Assessment Upper Extremity Assessment: Overall WFL for tasks assessed   Lower Extremity Assessment Lower Extremity Assessment: Generalized weakness   Cervical / Trunk Assessment Cervical / Trunk Assessment: Kyphotic   Communication Communication Communication: HOH   Cognition Arousal/Alertness: Awake/alert Behavior During Therapy: WFL for tasks assessed/performed Overall Cognitive Status: Impaired/Different from baseline Area of Impairment: Orientation;Attention;Following commands;Memory;Awareness                 Orientation Level: Disoriented to;Place;Time Current Attention Level: Sustained Memory: Decreased short-term memory Following Commands: Follows one step commands with increased time   Awareness: Intellectual   General Comments: Pt aware that she is not at "home" but could not tell me what the name of her ALF is. She requires re-direction to task, and cues for safety throughout session   General Comments  pleasant and cooperative throughout session    Exercises     Shoulder Instructions      Home Living Family/patient expects to be discharged to:: Assisted living                             Home Equipment: Wheelchair - manual;Walker - 4 wheels   Additional Comments: From [place], pt reports she used RW and w/c but limited with history given cognitive deficits       Prior Functioning/Environment Level of Independence: Needs assistance  Gait / Transfers Assistance Needed: Reports she used WC and RW, but was unsure if she required assist. Unsure of accuracy given cognitive deficits.  ADL's / Homemaking Assistance Needed: reports she can bath and dress herself, however, unsure of accuracy given cognitive deficits.    Comments: Pt unable to provide detailed history due to AMS and no family present to clarify        OT Problem List: Decreased activity tolerance;Impaired balance (sitting and/or standing);Decreased cognition;Decreased safety  awareness;Obesity      OT Treatment/Interventions: Self-care/ADL training;Therapeutic activities;Patient/family education;Balance training    OT Goals(Current goals can be found in the care plan section) Acute Rehab OT Goals Patient Stated Goal: get better and get back home OT Goal Formulation: With patient Time For Goal Achievement: 05/15/20 Potential to Achieve Goals: Good ADL Goals Pt Will Perform Grooming: standing;with supervision Pt Will Perform Upper Body Dressing: with set-up;sitting Pt Will Perform Lower Body Dressing: with supervision;sit to/from stand Pt Will Transfer to Toilet: with supervision;ambulating Pt Will Perform Toileting - Clothing Manipulation and hygiene: sitting/lateral leans;sit to/from stand;with supervision  OT Frequency: Min 2X/week   Barriers to D/C: Other (comment)  unsure in ALF can provide increased support (unsure of Pt's baseline)       Co-evaluation              AM-PAC OT "6 Clicks" Daily Activity     Outcome Measure Help from another person eating meals?: A Little Help from another person taking care of personal grooming?: A Little Help from another person toileting, which includes using toliet, bedpan, or urinal?: A Lot Help from another person bathing (including washing, rinsing, drying)?: A Lot Help from another person to put on and taking off regular upper body clothing?: A Little Help from another person to put on and taking off regular lower body clothing?: A Lot 6 Click Score: 15   End of Session Equipment Utilized  During Treatment: Gait belt;Rolling walker;Oxygen (3L) Nurse Communication: Mobility status;Precautions  Activity Tolerance: Patient tolerated treatment well Patient left: in chair;with chair alarm set;with call bell/phone within reach  OT Visit Diagnosis: Other abnormalities of gait and mobility (R26.89);Muscle weakness (generalized) (M62.81);Other symptoms and signs involving cognitive function                 Time: 1009-1105 OT Time Calculation (min): 56 min Charges:  OT General Charges $OT Visit: 1 Visit OT Evaluation $OT Eval Moderate Complexity: 1 Mod OT Treatments $Self Care/Home Management : 23-37 mins $Therapeutic Activity: 8-22 mins  Jesse Sans OTR/L Acute Rehabilitation Services Pager: [phone] Office: [place] 05/01/2020, 11:35 AM

## 2020-05-01 NOTE — Progress Notes (Signed)
PROGRESS NOTE    Dawn Morrow  JSH:[id number] DOB: Jan 11, 1936 DOA: 04/27/2020 PCP: Patient, No Pcp Per    Brief Narrative:  Dawn Morrow is a 84 y.o.femalewith a medical history ofhypertension, hyperlipidemia, hypothyroidism, atrial fibrillation, history of PE and DVT, who presented to the emergency department with confusion. She was recently admitted in July 2021 with similar presentation was found to have acute metabolic encephalopathy secondary to Enterococcus faecalis UTI, and patient was placed on vancomycin for 3 days. Patient was discharged to nursing facility and most recently went to [facility] assisted [facility] facility on 04/23/2020. Her niece was visiting with her this morning and noticed that she was completely altered.  Evaluation ED was remarkable for sepsis secondary to UTI and possible pneumonia.  H was consulted for further evaluation and treatment.   Assessment & Plan:   Active Problems:   Hypothyroidism   COPD (chronic obstructive pulmonary disease) (HCC)   Pulmonary embolism (HCC)   DVT (deep venous thrombosis) (HCC)   Diabetes mellitus type 2 in obese (HCC)   CKD (chronic kidney disease), stage III   Acute kidney failure (HCC)   Acute lower UTI   Atrial fibrillation, chronic (HCC)   Acute metabolic encephalopathy   Severe sepsis secondary to Proteus UTI/PNA, present on admission Patient presenting with effusion, altered mental status was found to have leukocytosis, tachycardia and acute renal failure.  Urinalysis with rare bacteria greater than 50 WBCs with large leukocytes.  Chest x-ray with increased retrocardiac markings.  Blood cultures 1/2 staph epidermidis likely contaminant. Hx recurrent UTIs with Enterococcus/E. Coli previously. --Urine culture with Proteus Mirabilis, resistant to Cipro, nitrofurantoin, Bactrim --WBC 16.8>>8.1 --Patient with PCN allergy; continue azithromycin x 5 days and aztreonam x 7 days  Acute metabolic encephalopathy Likely  multifactorial including infection versus hypercarbia, CT head unremarkable for acute findings.  Appears back to her normal baseline. --Supportive care and treatment as above.  Acute on chronic hypoxic/hypercarbic respiratory failure Patient with history of hypoxia and uses 3 L of oxygen at baseline. Also has a history of COPD. VBG showedpCO2 62.3 (on admission); and was initially placed on BiPAP.  Repeat ABG 04/27/2020 improvement of PCO2 down to 50s to 2.1. --Continue home supplemental oxygen, baseline 3 L per nasal cannula  Acute kidney injury on chronic kidney disease, stage IIIb Baseline creatinine has been approximately 1.1-1.5, Creatinine on admission was 1.9, however improved down to 1.15 --Continue IV fluids today --BMP daily  Hypotension: Resolved Possibly secondary to sepsis and dehydration.  Treated with IV antibiotics and IV fluids as above. --Continue monitor BP per unit protocol  History of PE/DVT,Chronic atrial fibrillationwith RVR --Continue Coreg and Xarelto  Essential hypertension --Continue Coreg  Hypothyroidism --Continue Synthroid 88 mcg p.o. daily  COPD with chronic respiratory hypoxic failure Patient uses 3 L of nasal cannula at baseline   Diabetes mellitus, type II Hemoglobin A1c 6.1 in April 2021 --Continue Lantus at reduced dose 7 units daily  --insulin sliding scale and CBG monitoring  Goals of care Discussed with niece at bedside patient's CODE STATUS. She would like to discuss it with her brother further. Currently remains full code  DVT prophylaxis: Xarelto Code Status: Full code Family Communication: No family present at bedside  Disposition Plan:  Status is: Inpatient  Remains inpatient appropriate because:Hemodynamically unstable, Unsafe d/c plan and IV treatments appropriate due to intensity of illness or inability to take PO awaiting SNF placement, continues on IV antibiotics with history is in a.m. due to penicillin  allergy, plan 2  additional days of treatment.   Dispo: The patient is from: ALF              Anticipated d/c is to: SNF              Anticipated d/c date is: 2 days              Patient currently is not medically stable to d/c.   Consultants:   None  Procedures:   None  Antimicrobials:   Azithromycin  Aztreonam   Subjective: Patient seen and examined bedside, resting comfortably.  Continues with profound weakness and fatigue.  Shortness of breath improved, now down to her normal supplemental oxygen of 3 L per nasal cannula.  Continues on IV antibiotics with aztreonam, plan 7-day course for Proteus UTI given her penicillin allergy.  No other questions or concerns at this time.  Denies headache, no fever/chills/night sweats, no nausea/vomiting/diarrhea, no chest pain, no palpitations, no shortness of breath more than her normal baseline, no abdominal pain.  No acute events overnight per nursing staff.  Objective: Vitals:   05/01/20 0004 05/01/20 0415 05/01/20 0816 05/01/20 1100  BP: (!) 108/91 (!) 144/78 (!) 168/87 (!) 145/95  Pulse: (!) 106 73 81   Resp: 17 14 17    Temp: 98.2 F (36.8 C) 98.3 F (36.8 C) 97.6 F (36.4 C) 97.6 F (36.4 C)  TempSrc: Oral Oral Oral Oral  SpO2: 94% 97% 99%   Weight:      Height:        Intake/Output Summary (Last 24 hours) at 05/01/2020 1234 Last data filed at 05/01/2020 0900 Gross per 24 hour  Intake 1496.97 ml  Output --  Net 1496.97 ml   Filed Weights   04/27/20 1600  Weight: 94.8 kg    Examination:  General exam: Appears calm and comfortable, chronically ill in appearance Respiratory system: Clear to auscultation. Respiratory effort normal.  On 3 L nasal cannula which is her baseline Cardiovascular system: S1 & S2 heard, RRR. No JVD, murmurs, rubs, gallops or clicks. No pedal edema. Gastrointestinal system: Abdomen is nondistended, soft and nontender. No organomegaly or masses felt. Normal bowel sounds heard. Central nervous  system: Alert and oriented. No focal neurological deficits. Extremities: Symmetric 5 x 5 power. Skin: No rashes, lesions or ulcers Psychiatry: Judgement and insight appear poor. Mood & affect appropriate.     Data Reviewed: I have personally reviewed following labs and imaging studies  CBC: Recent Labs  Lab 04/27/20 1259 04/27/20 1310 04/27/20 1359 04/27/20 1708 04/28/20 0316 04/29/20 0718 05/01/20 0328  WBC 16.8*  --   --   --  15.1* 9.7 8.1  NEUTROABS 14.4*  --   --   --   --   --   --   HGB 12.7   < > 14.3 12.6 11.7* 11.3* 11.1*  HCT 42.6   < > 42.0 37.0 39.3 37.8 36.3  MCV 95.7  --   --   --  95.6 95.9 96.0  PLT 180  --   --   --  163 156 171   < > = values in this interval not displayed.   Basic Metabolic Panel: Recent Labs  Lab 04/27/20 1259 04/27/20 1259 04/27/20 1310 04/27/20 1359 04/27/20 1708 04/28/20 0316 04/29/20 0718 04/30/20 0321 05/01/20 0328  NA 141   < > 141   < > 144 142 139 141 141  K 4.9   < > 5.3*   < > 4.6 4.3 4.5 4.3  4.0  CL 100   < > 101  --   --  105 105 107 107  CO2 28  --   --   --   --  29 27 26 25   GLUCOSE 108*   < > 106*  --   --  85 88 81 110*  BUN 31*   < > 45*  --   --  28* 20 16 15   CREATININE 1.90*   < > 1.80*  --   --  1.58* 1.29* 1.07* 1.15*  CALCIUM 8.6*  --   --   --   --  8.4* 8.0* 8.1* 8.3*   < > = values in this interval not displayed.   GFR: Estimated Creatinine Clearance: 42.1 mL/min (A) (by C-G formula based on SCr of 1.15 mg/dL (H)). Liver Function Tests: Recent Labs  Lab 04/27/20 1259 04/28/20 0316  AST 19 16  ALT 21 20  ALKPHOS 70 65  BILITOT 1.0 0.8  PROT 6.6 6.4*  ALBUMIN 3.1* 2.9*   No results for input(s): LIPASE, AMYLASE in the last 168 hours. No results for input(s): AMMONIA in the last 168 hours. Coagulation Profile: Recent Labs  Lab 04/27/20 1259 04/28/20 0316  INR 1.9* 3.3*   Cardiac Enzymes: Recent Labs  Lab 04/27/20 1259  CKTOTAL 42   BNP (last 3 results) No results for input(s):  PROBNP in the last 8760 hours. HbA1C: No results for input(s): HGBA1C in the last 72 hours. CBG: Recent Labs  Lab 04/30/20 1118 04/30/20 1625 04/30/20 2124 05/01/20 0619 05/01/20 1103  GLUCAP 81 95 131* 99 127*   Lipid Profile: No results for input(s): CHOL, HDL, LDLCALC, TRIG, CHOLHDL, LDLDIRECT in the last 72 hours. Thyroid Function Tests: No results for input(s): TSH, T4TOTAL, FREET4, T3FREE, THYROIDAB in the last 72 hours. Anemia Panel: No results for input(s): VITAMINB12, FOLATE, FERRITIN, TIBC, IRON, RETICCTPCT in the last 72 hours. Sepsis Labs: Recent Labs  Lab 04/27/20 1259 04/27/20 1446 04/28/20 0316  PROCALCITON  --   --  <0.10  LATICACIDVEN 1.4 1.2  --     Recent Results (from the past 240 hour(s))  SARS Coronavirus 2 by RT PCR (hospital order, performed in [facility] hospital lab) Nasopharyngeal Nasopharyngeal Swab     Status: None   Collection Time: 04/27/20 12:58 PM   Specimen: Nasopharyngeal Swab  Result Value Ref Range Status   SARS Coronavirus 2 NEGATIVE NEGATIVE Final    Comment: (NOTE) SARS-CoV-2 target nucleic acids are NOT DETECTED.  The SARS-CoV-2 RNA is generally detectable in upper and lower respiratory specimens during the acute phase of infection. The lowest concentration of SARS-CoV-2 viral copies this assay can detect is 250 copies / mL. A negative result does not preclude SARS-CoV-2 infection and should not be used as the sole basis for treatment or other patient management decisions.  A negative result may occur with improper specimen collection / handling, submission of specimen other than nasopharyngeal swab, presence of viral mutation(s) within the areas targeted by this assay, and inadequate number of viral copies (<250 copies / mL). A negative result must be combined with clinical observations, patient history, and epidemiological information.  Fact Sheet for Patients:   StrictlyIdeas.no  Fact Sheet for  Healthcare Providers: BankingDealers.co.za  This test is not yet approved or  cleared by the [place] FDA and has been authorized for detection and/or diagnosis of SARS-CoV-2 by FDA under an Emergency Use Authorization (EUA).  This EUA will remain in effect (meaning this  test can be used) for the duration of the COVID-19 declaration under Section 564(b)(1) of the Act, 21 U.S.C. section 360bbb-3(b)(1), unless the authorization is terminated or revoked sooner.  Performed at [facility], [place] [address], [place], [place] [address]   Culture, blood (routine x 2)     Status: None (Preliminary result)   Collection Time: 04/27/20 12:59 PM   Specimen: BLOOD RIGHT FOREARM  Result Value Ref Range Status   Specimen Description BLOOD RIGHT FOREARM  Final   Special Requests   Final    BOTTLES DRAWN AEROBIC AND ANAEROBIC Blood Culture adequate volume   Culture   Final    NO GROWTH 4 DAYS Performed at [facility], [place] [address], [place], [place] [address]    Report Status PENDING  Incomplete  Urine culture     Status: Abnormal   Collection Time: 04/27/20  1:03 PM   Specimen: Urine, Random  Result Value Ref Range Status   Specimen Description URINE, RANDOM  Final   Special Requests   Final    NONE Performed at [facility], [place] [address] [place], [place] [address]    Culture >=100,000 COLONIES/mL PROTEUS MIRABILIS (A)  Final   Report Status 04/30/2020 FINAL  Final   Organism ID, Bacteria PROTEUS MIRABILIS (A)  Final      Susceptibility   Proteus mirabilis - MIC*    AMPICILLIN <=2 SENSITIVE Sensitive     CEFAZOLIN <=4 SENSITIVE Sensitive     CEFTRIAXONE <=0.25 SENSITIVE Sensitive     CIPROFLOXACIN >=4 RESISTANT Resistant     GENTAMICIN <=1 SENSITIVE Sensitive     IMIPENEM 2 SENSITIVE Sensitive     NITROFURANTOIN 128 RESISTANT Resistant     TRIMETH/SULFA >=320 RESISTANT Resistant     AMPICILLIN/SULBACTAM <=2 SENSITIVE Sensitive      PIP/TAZO <=4 SENSITIVE Sensitive     * >=100,000 COLONIES/mL PROTEUS MIRABILIS  Culture, blood (routine x 2)     Status: Abnormal   Collection Time: 04/27/20  2:32 PM   Specimen: BLOOD  Result Value Ref Range Status   Specimen Description BLOOD SITE NOT SPECIFIED  Final   Special Requests   Final    BOTTLES DRAWN AEROBIC AND ANAEROBIC Blood Culture adequate volume   Culture  Setup Time   Final    GRAM POSITIVE COCCI IN CLUSTERS AEROBIC BOTTLE ONLY CRITICAL RESULT CALLED TO, READ BACK BY AND VERIFIED WITH: PHARMD D PIERCE AT 2023 04/28/20 BY L BENFIELD    Culture (A)  Final    STAPHYLOCOCCUS EPIDERMIDIS THE SIGNIFICANCE OF ISOLATING THIS ORGANISM FROM A SINGLE SET OF BLOOD CULTURES WHEN MULTIPLE SETS ARE DRAWN IS UNCERTAIN. PLEASE NOTIFY THE MICROBIOLOGY DEPARTMENT WITHIN ONE WEEK IF SPECIATION AND SENSITIVITIES ARE REQUIRED. Performed at [facility], [place] [address], [place], [place] [address]    Report Status 04/30/2020 FINAL  Final  Blood Culture ID Panel (Reflexed)     Status: Abnormal   Collection Time: 04/27/20  2:32 PM  Result Value Ref Range Status   Enterococcus faecalis NOT DETECTED NOT DETECTED Final   Enterococcus Faecium NOT DETECTED NOT DETECTED Final   Listeria monocytogenes NOT DETECTED NOT DETECTED Final   Staphylococcus species DETECTED (A) NOT DETECTED Final    Comment: CRITICAL RESULT CALLED TO, READ BACK BY AND VERIFIED WITH: PHARMD D PIERCE AT 2023 04/28/20 BY L BENFIELD    Staphylococcus aureus (BCID) NOT DETECTED NOT DETECTED Final   Staphylococcus epidermidis DETECTED (A) NOT DETECTED Final  Comment: Methicillin (oxacillin) resistant coagulase negative staphylococcus. Possible blood culture contaminant (unless isolated from more than one blood culture draw or clinical case suggests pathogenicity). No antibiotic treatment is indicated for blood  culture contaminants. CRITICAL RESULT CALLED TO, READ BACK BY AND VERIFIED WITH: PHARMD D PIERCE AT 2023  04/28/20 BY L BENFIELD    Staphylococcus lugdunensis NOT DETECTED NOT DETECTED Final   Streptococcus species NOT DETECTED NOT DETECTED Final   Streptococcus agalactiae NOT DETECTED NOT DETECTED Final   Streptococcus pneumoniae NOT DETECTED NOT DETECTED Final   Streptococcus pyogenes NOT DETECTED NOT DETECTED Final   A.calcoaceticus-baumannii NOT DETECTED NOT DETECTED Final   Bacteroides fragilis NOT DETECTED NOT DETECTED Final   Enterobacterales NOT DETECTED NOT DETECTED Final   Enterobacter cloacae complex NOT DETECTED NOT DETECTED Final   Escherichia coli NOT DETECTED NOT DETECTED Final   Klebsiella aerogenes NOT DETECTED NOT DETECTED Final   Klebsiella oxytoca NOT DETECTED NOT DETECTED Final   Klebsiella pneumoniae NOT DETECTED NOT DETECTED Final   Proteus species NOT DETECTED NOT DETECTED Final   Salmonella species NOT DETECTED NOT DETECTED Final   Serratia marcescens NOT DETECTED NOT DETECTED Final   Haemophilus influenzae NOT DETECTED NOT DETECTED Final   Neisseria meningitidis NOT DETECTED NOT DETECTED Final   Pseudomonas aeruginosa NOT DETECTED NOT DETECTED Final   Stenotrophomonas maltophilia NOT DETECTED NOT DETECTED Final   Candida albicans NOT DETECTED NOT DETECTED Final   Candida auris NOT DETECTED NOT DETECTED Final   Candida glabrata NOT DETECTED NOT DETECTED Final   Candida krusei NOT DETECTED NOT DETECTED Final   Candida parapsilosis NOT DETECTED NOT DETECTED Final   Candida tropicalis NOT DETECTED NOT DETECTED Final   Cryptococcus neoformans/gattii NOT DETECTED NOT DETECTED Final   Methicillin resistance mecA/C DETECTED (A) NOT DETECTED Final    Comment: CRITICAL RESULT CALLED TO, READ BACK BY AND VERIFIED WITH: PHARMD D PIERCE AT 2023 04/28/20 BY L BENFIELD Performed at [facility], [address] [place], [place] [address]   MRSA PCR Screening     Status: None   Collection Time: 04/28/20  4:01 AM   Specimen: Nasopharyngeal  Result Value Ref Range Status     MRSA by PCR NEGATIVE NEGATIVE Final    Comment:        The GeneXpert MRSA Assay (FDA approved for NASAL specimens only), is one component of a comprehensive MRSA colonization surveillance program. It is not intended to diagnose MRSA infection nor to guide or monitor treatment for MRSA infections. Performed at [facility], [place] [address] [place], [place] [address]          Radiology Studies: No results found.      Scheduled Meds:  atorvastatin  10 mg Oral QHS   azithromycin  500 mg Oral q1800   buPROPion  450 mg Oral Daily   carvedilol  6.25 mg Oral BID WC   docusate sodium  100 mg Oral Daily   ezetimibe  10 mg Oral Daily   gabapentin  300 mg Oral BID WC   gabapentin  600 mg Oral QHS   insulin aspart  0-5 Units Subcutaneous QHS   insulin aspart  0-9 Units Subcutaneous TID WC   insulin glargine  7 Units Subcutaneous QHS   levothyroxine  88 mcg Oral QAC breakfast   rivaroxaban  20 mg Oral QPM   sertraline  75 mg Oral Daily   Continuous Infusions:  sodium chloride 75 mL/hr at 05/01/20 0500   aztreonam 0.5  g (05/01/20 0818)     LOS: 4 days    Time spent: 36 minutes spent on chart review, discussion with nursing staff, consultants, updating family and interview/physical exam; more than 50% of that time was spent in counseling and/or coordination of care.    Tiahna Cure J [place] DO [facility] Available via Epic secure chat 7am-7pm After these hours, please refer to coverage provider listed on [email or web address] 05/01/2020, 12:34 PM

## 2020-05-01 NOTE — Progress Notes (Signed)
Pt refused bipap

## 2020-05-02 LAB — BASIC METABOLIC PANEL
Anion gap: 11 (ref 5–15)
BUN: 14 mg/dL (ref 8–23)
CO2: 25 mmol/L (ref 22–32)
Calcium: 8.6 mg/dL — ABNORMAL LOW (ref 8.9–10.3)
Chloride: 105 mmol/L (ref 98–111)
Creatinine, Ser: 1.08 mg/dL — ABNORMAL HIGH (ref 0.44–1.00)
GFR calc Af Amer: 55 mL/min — ABNORMAL LOW (ref >60)
GFR calc non Af Amer: 48 mL/min — ABNORMAL LOW (ref >60)
Glucose, Bld: 113 mg/dL — ABNORMAL HIGH (ref 70–99)
Potassium: 4.4 mmol/L (ref 3.5–5.1)
Sodium: 141 mmol/L (ref 135–145)

## 2020-05-02 LAB — CULTURE, BLOOD (ROUTINE X 2)
Culture: NO GROWTH
Special Requests: ADEQUATE

## 2020-05-02 LAB — GLUCOSE, CAPILLARY
Glucose-Capillary: 117 mg/dL — ABNORMAL HIGH (ref 70–99)
Glucose-Capillary: 112 mg/dL — ABNORMAL HIGH (ref 70–99)
Glucose-Capillary: 97 mg/dL (ref 70–99)
Glucose-Capillary: 83 mg/dL (ref 70–99)

## 2020-05-02 MED ORDER — DILTIAZEM HCL 25 MG/5ML IV SOLN
15.0000 mg | Freq: Once | INTRAVENOUS | Status: AC
  Administered 2020-05-02: 15 mg via INTRAVENOUS
  Filled 2020-05-02: qty 5

## 2020-05-02 MED ORDER — CARVEDILOL 12.5 MG PO TABS
12.5000 mg | ORAL_TABLET | Freq: Two times a day (BID) | ORAL | Status: DC
  Administered 2020-05-02 – 2020-05-06 (×9): 12.5 mg via ORAL
  Filled 2020-05-02 (×9): qty 1

## 2020-05-02 NOTE — Progress Notes (Addendum)
   05/02/20 0339  Vitals  Temp 97.9 F (36.6 C)  Temp Source Oral  BP (!) 137/98  MAP (mmHg) 109  BP Location Left Arm  BP Method Automatic  Patient Position (if appropriate) Lying  Pulse Rate (!) 115  Pulse Rate Source Monitor  ECG Heart Rate (!) 119   Pt w/ increased HR this pm, HR between 120s-140s. Notified Opyd MD, see new orders.  x1 15mg  cardizem IV given. HR now 70s-80s.  Jaymes Graff, RN

## 2020-05-02 NOTE — Progress Notes (Signed)
PROGRESS NOTE    Dawn Morrow  VOZ:[id number] DOB: 02/15/1936 DOA: 04/27/2020 PCP: Patient, No Pcp Per    Brief Narrative:  Dawn Morrow is a 84 y.o.femalewith a medical history ofhypertension, hyperlipidemia, hypothyroidism, atrial fibrillation, history of PE and DVT, who presented to the emergency department with confusion. She was recently admitted in July 2021 with similar presentation was found to have acute metabolic encephalopathy secondary to Enterococcus faecalis UTI, and patient was placed on vancomycin for 3 days. Patient was discharged to nursing facility and most recently went to [facility] assisted living facility on 04/23/2020. Her niece was visiting with her this morning and noticed that she was completely altered.  Evaluation ED was remarkable for sepsis secondary to UTI and possible pneumonia.  H was consulted for further evaluation and treatment.   Assessment & Plan:   Active Problems:   Hypothyroidism   COPD (chronic obstructive pulmonary disease) (HCC)   Pulmonary embolism (HCC)   DVT (deep venous thrombosis) (HCC)   Diabetes mellitus type 2 in obese (HCC)   CKD (chronic kidney disease), stage III   Acute kidney failure (HCC)   Acute lower UTI   Atrial fibrillation, chronic (HCC)   Acute metabolic encephalopathy   Severe sepsis secondary to Proteus UTI/PNA, present on admission Patient presenting with effusion, altered mental status was found to have leukocytosis, tachycardia and acute renal failure.  Urinalysis with rare bacteria greater than 50 WBCs with large leukocytes.  Chest x-ray with increased retrocardiac markings.  Blood cultures 1/2 staph epidermidis likely contaminant. Hx recurrent UTIs with Enterococcus/E. Coli previously. --Urine culture with Proteus Mirabilis, resistant to Cipro, nitrofurantoin, Bactrim --WBC 16.8>>8.1 --Patient with PCN allergy; continue azithromycin x 5 days and aztreonam x 7 days  Acute metabolic encephalopathy Likely  multifactorial including infection versus hypercarbia, CT head unremarkable for acute findings.  Appears back to her normal baseline. --Supportive care and treatment as above.  Acute on chronic hypoxic/hypercarbic respiratory failure Patient with history of hypoxia and uses 3 L of oxygen at baseline. Also has a history of COPD. VBG showedpCO2 62.3 (on admission); and was initially placed on BiPAP.  Repeat ABG 04/27/2020 improvement of PCO2 down to 50s to 2.1. --Continue home supplemental oxygen, baseline 3 L per nasal cannula  Acute kidney injury on chronic kidney disease, stage IIIb Baseline creatinine has been approximately 1.1-1.5, Creatinine on admission was 1.9, however improved down to 1.15 --Continue IV fluids today --BMP daily  Hypotension: Resolved Possibly secondary to sepsis and dehydration.  Treated with IV antibiotics and IV fluids as above. --Continue monitor BP per unit protocol  History of PE/DVT,Chronic atrial fibrillationwith RVR --Coreg increased to 12.5 mg p.o. twice daily for episode of A. fib with RVR on 8/25 --Continue Xarelto  Essential hypertension --Continue Coreg 12.5 mg BID  Hypothyroidism --Continue Synthroid 88 mcg p.o. daily  COPD with chronic respiratory hypoxic failure Patient uses 3 L of nasal cannula at baseline   Diabetes mellitus, type II Hemoglobin A1c 6.1 in April 2021 --Continue Lantus at reduced dose 7 units daily  --insulin sliding scale and CBG monitoring  Goals of care Discussed with niece at bedside patient's CODE STATUS. She would like to discuss it with her brother further. Currently remains full code  DVT prophylaxis: Xarelto Code Status: Full code Family Communication: Family present at bedside this afternoon  Disposition Plan:  Status is: Inpatient  Remains inpatient appropriate because:Hemodynamically unstable, Unsafe d/c plan and IV treatments appropriate due to intensity of illness or inability to take PO  will complete IV antibiotic therapy on 05/03/2020 then can discharge back to [facility] with home health services.  Family declines SNF placement..   Dispo: The patient is from: ALF              Anticipated d/c is to: ALF              Anticipated d/c date is: 1 day              Patient currently is not medically stable to d/c.   Consultants:   None  Procedures:   None  Antimicrobials:   Azithromycin  Aztreonam   Subjective: Patient seen and examined bedside, resting comfortably.  Continues with profound weakness and fatigue.  Shortness of breath improved, now down to her normal supplemental oxygen of 3 L per nasal cannula.  Continues on IV antibiotics with aztreonam, plan 7-day course for Proteus UTI given her penicillin allergy which will be complete on 05/03/2020.  Family present at bedside and all questions answered. Denies headache, no fever/chills/night sweats, no nausea/vomiting/diarrhea, no chest pain, no palpitations, no shortness of breath more than her normal baseline, no abdominal pain.  No acute events overnight per nursing staff.  Objective: Vitals:   05/01/20 2325 05/02/20 0339 05/02/20 0818 05/02/20 0819  BP: (!) 155/104 (!) 137/98 130/85 130/85  Pulse: (!) 103 (!) 115 75 75  Resp: 20 20 18 18   Temp: 97.9 F (36.6 C) 97.9 F (36.6 C) 97.6 F (36.4 C) 97.6 F (36.4 C)  TempSrc: Oral Oral Oral   SpO2: 92% 91% 100%   Weight:      Height:        Intake/Output Summary (Last 24 hours) at 05/02/2020 1225 Last data filed at 05/02/2020 0915 Gross per 24 hour  Intake 870.12 ml  Output 3250 ml  Net -2379.88 ml   Filed Weights   04/27/20 1600  Weight: 94.8 kg    Examination:  General exam: Appears calm and comfortable, chronically ill in appearance Respiratory system: Clear to auscultation. Respiratory effort normal.  On 3 L nasal cannula which is her baseline Cardiovascular system: S1 & S2 heard, RRR. No JVD, murmurs, rubs, gallops or clicks. No pedal  edema. Gastrointestinal system: Abdomen is nondistended, soft and nontender. No organomegaly or masses felt. Normal bowel sounds heard. Central nervous system: Alert and oriented. No focal neurological deficits. Extremities: Symmetric 5 x 5 power. Skin: No rashes, lesions or ulcers Psychiatry: Judgement and insight appear poor. Mood & affect appropriate.     Data Reviewed: I have personally reviewed following labs and imaging studies  CBC: Recent Labs  Lab 04/27/20 1259 04/27/20 1310 04/27/20 1359 04/27/20 1708 04/28/20 0316 04/29/20 0718 05/01/20 0328  WBC 16.8*  --   --   --  15.1* 9.7 8.1  NEUTROABS 14.4*  --   --   --   --   --   --   HGB 12.7   < > 14.3 12.6 11.7* 11.3* 11.1*  HCT 42.6   < > 42.0 37.0 39.3 37.8 36.3  MCV 95.7  --   --   --  95.6 95.9 96.0  PLT 180  --   --   --  163 156 171   < > = values in this interval not displayed.   Basic Metabolic Panel: Recent Labs  Lab 04/28/20 0316 04/29/20 0718 04/30/20 0321 05/01/20 0328 05/02/20 0253  NA 142 139 141 141 141  K 4.3 4.5 4.3 4.0 4.4  CL 105  105 107 107 105  CO2 29 27 26 25 25   GLUCOSE 85 88 81 110* 113*  BUN 28* 20 16 15 14   CREATININE 1.58* 1.29* 1.07* 1.15* 1.08*  CALCIUM 8.4* 8.0* 8.1* 8.3* 8.6*   GFR: Estimated Creatinine Clearance: 44.8 mL/min (A) (by C-G formula based on SCr of 1.08 mg/dL (H)). Liver Function Tests: Recent Labs  Lab 04/27/20 1259 04/28/20 0316  AST 19 16  ALT 21 20  ALKPHOS 70 65  BILITOT 1.0 0.8  PROT 6.6 6.4*  ALBUMIN 3.1* 2.9*   No results for input(s): LIPASE, AMYLASE in the last 168 hours. No results for input(s): AMMONIA in the last 168 hours. Coagulation Profile: Recent Labs  Lab 04/27/20 1259 04/28/20 0316  INR 1.9* 3.3*   Cardiac Enzymes: Recent Labs  Lab 04/27/20 1259  CKTOTAL 42   BNP (last 3 results) No results for input(s): PROBNP in the last 8760 hours. HbA1C: No results for input(s): HGBA1C in the last 72 hours. CBG: Recent Labs  Lab  05/01/20 1103 05/01/20 1628 05/01/20 2117 05/02/20 0607 05/02/20 1134  GLUCAP 127* 122* 151* 117* 112*   Lipid Profile: No results for input(s): CHOL, HDL, LDLCALC, TRIG, CHOLHDL, LDLDIRECT in the last 72 hours. Thyroid Function Tests: No results for input(s): TSH, T4TOTAL, FREET4, T3FREE, THYROIDAB in the last 72 hours. Anemia Panel: No results for input(s): VITAMINB12, FOLATE, FERRITIN, TIBC, IRON, RETICCTPCT in the last 72 hours. Sepsis Labs: Recent Labs  Lab 04/27/20 1259 04/27/20 1446 04/28/20 0316  PROCALCITON  --   --  <0.10  LATICACIDVEN 1.4 1.2  --     Recent Results (from the past 240 hour(s))  SARS Coronavirus 2 by RT PCR (hospital order, performed in [facility] hospital lab) Nasopharyngeal Nasopharyngeal Swab     Status: None   Collection Time: 04/27/20 12:58 PM   Specimen: Nasopharyngeal Swab  Result Value Ref Range Status   SARS Coronavirus 2 NEGATIVE NEGATIVE Final    Comment: (NOTE) SARS-CoV-2 target nucleic acids are NOT DETECTED.  The SARS-CoV-2 RNA is generally detectable in upper and lower respiratory specimens during the acute phase of infection. The lowest concentration of SARS-CoV-2 viral copies this assay can detect is 250 copies / mL. A negative result does not preclude SARS-CoV-2 infection and should not be used as the sole basis for treatment or other patient management decisions.  A negative result may occur with improper specimen collection / handling, submission of specimen other than nasopharyngeal swab, presence of viral mutation(s) within the areas targeted by this assay, and inadequate number of viral copies (<250 copies / mL). A negative result must be combined with clinical observations, patient history, and epidemiological information.  Fact Sheet for Patients:   StrictlyIdeas.no  Fact Sheet for Healthcare Providers: BankingDealers.co.za  This test is not yet approved or  cleared by  the [place] FDA and has been authorized for detection and/or diagnosis of SARS-CoV-2 by FDA under an Emergency Use Authorization (EUA).  This EUA will remain in effect (meaning this test can be used) for the duration of the COVID-19 declaration under Section 564(b)(1) of the Act, 21 U.S.C. section 360bbb-3(b)(1), unless the authorization is terminated or revoked sooner.  Performed at [facility], [place] [address] [place], [place] [address]   Culture, blood (routine x 2)     Status: None   Collection Time: 04/27/20 12:59 PM   Specimen: BLOOD RIGHT FOREARM  Result Value Ref Range Status   Specimen Description BLOOD RIGHT FOREARM  Final   Special Requests   Final    BOTTLES DRAWN AEROBIC AND ANAEROBIC Blood Culture adequate volume   Culture   Final    NO GROWTH 5 DAYS Performed at [facility], [address], [place], [place] [address]    Report Status 05/02/2020 FINAL  Final  Urine culture     Status: Abnormal   Collection Time: 04/27/20  1:03 PM   Specimen: Urine, Random  Result Value Ref Range Status   Specimen Description URINE, RANDOM  Final   Special Requests   Final    NONE Performed at [facility], [place] [address], [place], [place] [address]    Culture >=100,000 COLONIES/mL PROTEUS MIRABILIS (A)  Final   Report Status 04/30/2020 FINAL  Final   Organism ID, Bacteria PROTEUS MIRABILIS (A)  Final      Susceptibility   Proteus mirabilis - MIC*    AMPICILLIN <=2 SENSITIVE Sensitive     CEFAZOLIN <=4 SENSITIVE Sensitive     CEFTRIAXONE <=0.25 SENSITIVE Sensitive     CIPROFLOXACIN >=4 RESISTANT Resistant     GENTAMICIN <=1 SENSITIVE Sensitive     IMIPENEM 2 SENSITIVE Sensitive     NITROFURANTOIN 128 RESISTANT Resistant     TRIMETH/SULFA >=320 RESISTANT Resistant     AMPICILLIN/SULBACTAM <=2 SENSITIVE Sensitive     PIP/TAZO <=4 SENSITIVE Sensitive     * >=100,000 COLONIES/mL PROTEUS MIRABILIS  Culture, blood (routine x 2)     Status: Abnormal    Collection Time: 04/27/20  2:32 PM   Specimen: BLOOD  Result Value Ref Range Status   Specimen Description BLOOD SITE NOT SPECIFIED  Final   Special Requests   Final    BOTTLES DRAWN AEROBIC AND ANAEROBIC Blood Culture adequate volume   Culture  Setup Time   Final    GRAM POSITIVE COCCI IN CLUSTERS AEROBIC BOTTLE ONLY CRITICAL RESULT CALLED TO, READ BACK BY AND VERIFIED WITH: PHARMD D PIERCE AT 2023 04/28/20 BY L BENFIELD    Culture (A)  Final    STAPHYLOCOCCUS EPIDERMIDIS THE SIGNIFICANCE OF ISOLATING THIS ORGANISM FROM A SINGLE SET OF BLOOD CULTURES WHEN MULTIPLE SETS ARE DRAWN IS UNCERTAIN. PLEASE NOTIFY THE MICROBIOLOGY DEPARTMENT WITHIN ONE WEEK IF SPECIATION AND SENSITIVITIES ARE REQUIRED. Performed at [facility], [place] [address], [place], [place] [address]    Report Status 04/30/2020 FINAL  Final  Blood Culture ID Panel (Reflexed)     Status: Abnormal   Collection Time: 04/27/20  2:32 PM  Result Value Ref Range Status   Enterococcus faecalis NOT DETECTED NOT DETECTED Final   Enterococcus Faecium NOT DETECTED NOT DETECTED Final   Listeria monocytogenes NOT DETECTED NOT DETECTED Final   Staphylococcus species DETECTED (A) NOT DETECTED Final    Comment: CRITICAL RESULT CALLED TO, READ BACK BY AND VERIFIED WITH: PHARMD D PIERCE AT 2023 04/28/20 BY L BENFIELD    Staphylococcus aureus (BCID) NOT DETECTED NOT DETECTED Final   Staphylococcus epidermidis DETECTED (A) NOT DETECTED Final    Comment: Methicillin (oxacillin) resistant coagulase negative staphylococcus. Possible blood culture contaminant (unless isolated from more than one blood culture draw or clinical case suggests pathogenicity). No antibiotic treatment is indicated for blood  culture contaminants. CRITICAL RESULT CALLED TO, READ BACK BY AND VERIFIED WITH: PHARMD D PIERCE AT 2023 04/28/20 BY L BENFIELD    Staphylococcus lugdunensis NOT DETECTED NOT DETECTED Final   Streptococcus species NOT DETECTED NOT DETECTED  Final   Streptococcus agalactiae NOT DETECTED NOT  DETECTED Final   Streptococcus pneumoniae NOT DETECTED NOT DETECTED Final   Streptococcus pyogenes NOT DETECTED NOT DETECTED Final   A.calcoaceticus-baumannii NOT DETECTED NOT DETECTED Final   Bacteroides fragilis NOT DETECTED NOT DETECTED Final   Enterobacterales NOT DETECTED NOT DETECTED Final   Enterobacter cloacae complex NOT DETECTED NOT DETECTED Final   Escherichia coli NOT DETECTED NOT DETECTED Final   Klebsiella aerogenes NOT DETECTED NOT DETECTED Final   Klebsiella oxytoca NOT DETECTED NOT DETECTED Final   Klebsiella pneumoniae NOT DETECTED NOT DETECTED Final   Proteus species NOT DETECTED NOT DETECTED Final   Salmonella species NOT DETECTED NOT DETECTED Final   Serratia marcescens NOT DETECTED NOT DETECTED Final   Haemophilus influenzae NOT DETECTED NOT DETECTED Final   Neisseria meningitidis NOT DETECTED NOT DETECTED Final   Pseudomonas aeruginosa NOT DETECTED NOT DETECTED Final   Stenotrophomonas maltophilia NOT DETECTED NOT DETECTED Final   Candida albicans NOT DETECTED NOT DETECTED Final   Candida auris NOT DETECTED NOT DETECTED Final   Candida glabrata NOT DETECTED NOT DETECTED Final   Candida krusei NOT DETECTED NOT DETECTED Final   Candida parapsilosis NOT DETECTED NOT DETECTED Final   Candida tropicalis NOT DETECTED NOT DETECTED Final   Cryptococcus neoformans/gattii NOT DETECTED NOT DETECTED Final   Methicillin resistance mecA/C DETECTED (A) NOT DETECTED Final    Comment: CRITICAL RESULT CALLED TO, READ BACK BY AND VERIFIED WITH: PHARMD D PIERCE AT 2023 04/28/20 BY L BENFIELD Performed at [facility], [address], [place], [place] [address]   MRSA PCR Screening     Status: None   Collection Time: 04/28/20  4:01 AM   Specimen: Nasopharyngeal  Result Value Ref Range Status   MRSA by PCR NEGATIVE NEGATIVE Final    Comment:        The GeneXpert MRSA Assay (FDA approved for NASAL specimens only), is one  component of a comprehensive MRSA colonization surveillance program. It is not intended to diagnose MRSA infection nor to guide or monitor treatment for MRSA infections. Performed at [facility], [place] [address], [place], [place] [address]          Radiology Studies: No results found.      Scheduled Meds:  atorvastatin  10 mg Oral QHS   buPROPion  450 mg Oral Daily   carvedilol  12.5 mg Oral BID WC   docusate sodium  100 mg Oral Daily   ezetimibe  10 mg Oral Daily   gabapentin  300 mg Oral BID WC   gabapentin  600 mg Oral QHS   insulin aspart  0-5 Units Subcutaneous QHS   insulin aspart  0-9 Units Subcutaneous TID WC   insulin glargine  7 Units Subcutaneous QHS   levothyroxine  88 mcg Oral QAC breakfast   rivaroxaban  20 mg Oral QPM   sertraline  75 mg Oral Daily   Continuous Infusions:  aztreonam 0.5 g (05/02/20 1045)     LOS: 5 days    Time spent: 35 minutes spent on chart review, discussion with nursing staff, consultants, updating family and interview/physical exam; more than 50% of that time was spent in counseling and/or coordination of care.    Graviela Nodal J [place] DO [facility] Available via Epic secure chat 7am-7pm After these hours, please refer to coverage provider listed on [email or web address] 05/02/2020, 12:25 PM

## 2020-05-02 NOTE — Progress Notes (Signed)
Pt refused bipap

## 2020-05-03 LAB — GLUCOSE, CAPILLARY
Glucose-Capillary: 95 mg/dL (ref 70–99)
Glucose-Capillary: 101 mg/dL — ABNORMAL HIGH (ref 70–99)
Glucose-Capillary: 143 mg/dL — ABNORMAL HIGH (ref 70–99)
Glucose-Capillary: 109 mg/dL — ABNORMAL HIGH (ref 70–99)
Glucose-Capillary: 121 mg/dL — ABNORMAL HIGH (ref 70–99)

## 2020-05-03 LAB — SARS CORONAVIRUS 2 (TAT 6-24 HRS): SARS Coronavirus 2: NEGATIVE

## 2020-05-03 NOTE — TOC Initial Note (Signed)
Transition of Care [facility]) - Initial/Assessment Note    Patient Details  Name: Dawn Morrow MRN: [id number] Date of Birth: 11/18/1935  [facility] CM/SW Contact:    Vinie Sill, [place] Phone Number: 05/03/2020, 1:15 PM  Clinical Narrative:                  CSW met with patient and her niece,[name] at bedside. CSW introduced self and explained role. CSW discussed PT recommendation of short term rehab at [facility]. Patient declined SNF, states she wants to return back [facility]. Patient expressed she has friends at [facility] and she wants to go home.   CSW spoke with Donnetta Simpers at [facility], she received information and expressed they preferred and believed it was in the best interest of the patient to follow recommendation of short rehab at [facility]. CSW explained the family and patient wants to return. Nanine Means states they will review and call CSW back.   CSW is waiting on call back from  [facility] to confirm if the patient can return.  MD& RN updated.  CSW will continue to follow and assist with discharge planning.  Thurmond Butts, MSW, [place] Clinical Social Worker    Expected Discharge Plan: Assisted Living Barriers to Discharge:  (PT recommends SNF- patient want to return to ALF- waiting to hear back from ALF to determine of they can met her needs)   Patient Goals and CMS Choice Patient states their goals for this hospitalization and ongoing recovery are:: patient wants to return to [facility] " I want to be with my friends"      Expected Discharge Plan and Services Expected Discharge Plan: Assisted Living In-house Referral: Clinical Social Work     Living arrangements for the past 2 months: [place]                                      Prior Living Arrangements/Services Living arrangements for the past 2 months: [place] Lives with:: Self, Facility Resident Patient language and need for interpreter reviewed:: No         Need for Family Participation in Patient Care: Yes (Comment) Care giver support system in place?: Yes (comment)   Criminal Activity/Legal Involvement Pertinent to Current Situation/Hospitalization: No - Comment as needed  Activities of Daily Living      Permission Sought/Granted Permission sought to share information with : Family Supports Permission granted to share information with : Yes, Verbal Permission Granted  Share Information with NAME: Merrilee Jansky     Permission granted to share info w Relationship: niece  Permission granted to share info w Contact Information: [phone]  Emotional Assessment Appearance:: Appears stated age Attitude/Demeanor/Rapport: Engaged (a little confused) Affect (typically observed): Agitated (does not want to go to SNF) Orientation: : Oriented to Self, Oriented to Place, Oriented to  Time Alcohol / Substance Use: Not Applicable Psych Involvement: No (comment)  Admission diagnosis:  CO2 retention [E87.2] Acute UTI [N39.0] Altered mental status, unspecified altered mental status type [F62.13] Acute metabolic encephalopathy [Y86.57] Patient Active Problem List   Diagnosis Date Noted  . Acute metabolic encephalopathy 84/69/6295  . AMS (altered mental status) 03/26/2020  . History of pulmonary embolism 03/26/2020  . History of DVT (deep vein thrombosis) 03/26/2020  . Atrial fibrillation, chronic ([place]) 03/26/2020  . Sepsis ([place]) 12/14/2019  . Acute encephalopathy 12/14/2019  . COVID-19 virus infection 10/02/2019  . Acute  lower UTI 10/02/2019  . Acute kidney failure ([place]) 10/01/2019  . Syncope 09/18/2017  . Near syncope 09/17/2017  . Diabetes mellitus type 2 in obese ([place]) 09/17/2017  . Atrial fibrillation with RVR ([place]) 09/17/2017  . Hypomagnesemia 09/17/2017  . CKD (chronic kidney disease), stage III 09/17/2017  . Forehead contusion   . MGUS (monoclonal gammopathy of unknown significance) 08/13/2016  . Renal failure 07/09/2015   . Follow-up ---------PCP NOTES 05/17/2015  . Essential tremor 12/28/2014  . Ulnar neuropathy of left upper extremity 12/28/2014  . Intractable pain 06/21/2014  . Back pain 06/21/2014  . Lumbar radiculopathy 06/21/2014  . Encounter for therapeutic drug monitoring 03/14/2014  . Numbness 10/18/2013  . TIA (transient ischemic attack) 09/19/2013  . Weakness 09/18/2013  . Annual physical exam 05/30/2012  . Tremor 01/21/2012  . Failure to thrive and poor med compliance  01/21/2012  . Pulmonary embolism ([place]) 11/18/2010  . DVT (deep venous thrombosis) ([place]) 11/18/2010  . Long term current use of anticoagulant 11/18/2010  . ABNORMAL ELECTROCARDIOGRAM 11/10/2010  . OSTEOARTHRITIS 08/06/2010  . DIZZINESS 04/15/2010  . Diabetes ([place]) 06/04/2009  . ACNE ROSACEA 11/28/2008  . BACK PAIN 05/16/2008  . HIP PAIN, RIGHT, CHRONIC 09/15/2007  . Hypothyroidism 09/13/2007  . Dyslipidemia 09/13/2007  . Osteoporosis 07/22/2007  . DEPRESSION 09/11/2006  . HTN (hypertension) 09/11/2006  . ASTHMA 09/11/2006  . COPD (chronic obstructive pulmonary disease) ([place]) 09/11/2006  . GERD 09/11/2006  . Recurrent UTI 09/11/2006  . [place], HX OF 09/11/2006   PCP:  Patient, No Pcp Per Pharmacy:  No Pharmacies Listed    Social Determinants of Health (SDOH) Interventions    Readmission Risk Interventions Readmission Risk Prevention Plan 10/03/2019  Transportation Screening Complete  PCP or Specialist Appt within 3-5 Days Complete  HRI or [place] Not Complete  HRI or Home Care Consult comments Patient at her baseline  Social Work Consult for [place] Planning/Counseling [place] Not Applicable  Medication Review Press photographer) Referral to Pharmacy  Some recent data might be hidden

## 2020-05-03 NOTE — TOC Progression Note (Addendum)
Transition of Care [facility]) - Progression Note    Patient Details  Name: Dawn Morrow MRN: [id number] Date of Birth: 1935-11-26  Transition of Care [facility]) CM/SW [place], [place] Phone Number: 05/03/2020, 2:05 PM  Clinical Narrative:     Nanine Means confirmed the patient will need to go to rehab before returning to [place].   CSW called patient's niece and advised Nanine Means recommends SNF before returning.   CSW advised the patient Nanine Means requesting she goes to rehab before returning home, patient states she was agreeable to rehab but states " I don't want to go where I went last time, that is why I can not walk". CSW was given permission from patient and family to send referrals.   CSW will provide bed offers once available. CSW will continue to follow and assist with discharge planning.   MD & RN updated  Thurmond Butts, MSW, [place] Clinical Social Worker    Expected Discharge Plan: Assisted Living Barriers to Discharge:  (PT recommends SNF- patient want to return to ALF- waiting to hear back from ALF to determine of they can met her needs)  Expected Discharge Plan and Services Expected Discharge Plan: Assisted Living In-house Referral: Clinical Social Work     Living arrangements for the past 2 months: [place]                                       Social Determinants of Health (SDOH) Interventions    Readmission Risk Interventions Readmission Risk Prevention Plan 10/03/2019  Transportation Screening Complete  PCP or Specialist Appt within 3-5 Days Complete  HRI or [place] Not Complete  HRI or Home Care Consult comments Patient at her baseline  Social Work Consult for [place] Planning/Counseling [place] Screening Not Applicable  Medication Review Press photographer) Referral to Pharmacy  Some recent data might be hidden

## 2020-05-03 NOTE — TOC Progression Note (Signed)
Transition of Care [facility]) - Progression Note    Patient Details  Name: CLARANN HELVEY MRN: [id number] Date of Birth: 12/10/1935  Transition of Care [facility]) CM/SW [place], [place] Phone Number: 05/03/2020, 4:56 PM  Clinical Narrative:     CSW informed Nanine Means, patient was at [facility] 04/04/2020-04/23/2020 and maybe in co-pay status.  CSW will continue to follow.   Thurmond Butts, MSW, [place] Clinical Social Worker   Expected Discharge Plan: Assisted Living Barriers to Discharge:  (PT recommends SNF- patient want to return to ALF- waiting to hear back from ALF to determine of they can met her needs)  Expected Discharge Plan and Services Expected Discharge Plan: Assisted Living In-house Referral: Clinical Social Work     Living arrangements for the past 2 months: [place]                                       Social Determinants of Health (SDOH) Interventions    Readmission Risk Interventions Readmission Risk Prevention Plan 10/03/2019  Transportation Screening Complete  PCP or Specialist Appt within 3-5 Days Complete  HRI or [place] Not Complete  HRI or Home Care Consult comments Patient at her baseline  Social Work Consult for [place] Planning/Counseling [place] Screening Not Applicable  Medication Review Press photographer) Referral to Pharmacy  Some recent data might be hidden

## 2020-05-03 NOTE — NC FL2 (Signed)
[place] LEVEL OF CARE SCREENING TOOL     IDENTIFICATION  Patient Name: Dawn Morrow Birthdate: March 13, 1936 Sex: female Admission Date (Current Location): 04/27/2020  [facility] and [place] Number:  Herbalist and Address:  [facility] [place]. [facility], [place] [address] [place], [place] [address]      Provider Number: [id number]  Attending Physician Name and Address:  Barb Merino, MD  Relative Name and Phone Number:  [name] niece, [phone]    Current Level of Care: Hospital Recommended Level of Care: [place] Prior Approval Number:    Date Approved/Denied:   PASRR Number: [id number]  Discharge Plan: SNF    Current Diagnoses: Patient Active Problem List   Diagnosis Date Noted  . Acute metabolic encephalopathy 43/15/4008  . AMS (altered mental status) 03/26/2020  . History of pulmonary embolism 03/26/2020  . History of DVT (deep vein thrombosis) 03/26/2020  . Atrial fibrillation, chronic ([place]) 03/26/2020  . Sepsis ([place]) 12/14/2019  . Acute encephalopathy 12/14/2019  . COVID-19 virus infection 10/02/2019  . Acute lower UTI 10/02/2019  . Acute kidney failure ([place]) 10/01/2019  . Syncope 09/18/2017  . Near syncope 09/17/2017  . Diabetes mellitus type 2 in obese ([place]) 09/17/2017  . Atrial fibrillation with RVR ([place]) 09/17/2017  . Hypomagnesemia 09/17/2017  . CKD (chronic kidney disease), stage III 09/17/2017  . Forehead contusion   . MGUS (monoclonal gammopathy of unknown significance) 08/13/2016  . Renal failure 07/09/2015  . Follow-up ---------PCP NOTES 05/17/2015  . Essential tremor 12/28/2014  . Ulnar neuropathy of left upper extremity 12/28/2014  . Intractable pain 06/21/2014  . Back pain 06/21/2014  . Lumbar radiculopathy 06/21/2014  . Encounter for therapeutic drug monitoring 03/14/2014  . Numbness 10/18/2013  . TIA (transient ischemic attack) 09/19/2013  . Weakness 09/18/2013  . Annual physical exam  05/30/2012  . Tremor 01/21/2012  . Failure to thrive and poor med compliance  01/21/2012  . Pulmonary embolism ([place]) 11/18/2010  . DVT (deep venous thrombosis) ([place]) 11/18/2010  . Long term current use of anticoagulant 11/18/2010  . ABNORMAL ELECTROCARDIOGRAM 11/10/2010  . OSTEOARTHRITIS 08/06/2010  . DIZZINESS 04/15/2010  . Diabetes ([place]) 06/04/2009  . ACNE ROSACEA 11/28/2008  . BACK PAIN 05/16/2008  . HIP PAIN, RIGHT, CHRONIC 09/15/2007  . Hypothyroidism 09/13/2007  . Dyslipidemia 09/13/2007  . Osteoporosis 07/22/2007  . DEPRESSION 09/11/2006  . HTN (hypertension) 09/11/2006  . ASTHMA 09/11/2006  . COPD (chronic obstructive pulmonary disease) ([place]) 09/11/2006  . GERD 09/11/2006  . Recurrent UTI 09/11/2006  . [place]EN[place] FILTER INSERTION, HX OF 09/11/2006    Orientation RESPIRATION BLADDER Height & Weight     Self, Time, Situation, Place  O2 (Nasal cannula 5L) Incontinent Weight: 209 lb (94.8 kg) Height:  5\' 4"  (162.6 cm)  BEHAVIORAL SYMPTOMS/MOOD NEUROLOGICAL BOWEL NUTRITION STATUS      Continent Diet (Please see DC Summary)  AMBULATORY STATUS COMMUNICATION OF NEEDS Skin   Limited Assist Verbally Normal                       Personal Care Assistance Level of Assistance  Bathing, Feeding, Dressing Bathing Assistance: Limited assistance Feeding assistance: Independent Dressing Assistance: Limited assistance     Functional Limitations Info  Sight, Hearing, Speech Sight Info: Adequate Hearing Info: Impaired Speech Info: Adequate    SPECIAL CARE FACTORS FREQUENCY  PT (By licensed PT), OT (By licensed OT)     PT Frequency: 5x/week OT Frequency: 5x/week  Contractures Contractures Info: Not present    Additional Factors Info  Code Status, Allergies, Psychotropic, Insulin Sliding Scale, Isolation Precautions Code Status Info: DNR Allergies Info: Penicillins, Sulfonylureas, Codeine, Sulfonamide Derivatives Psychotropic Info: Zoloft,  Wellbutrin Insulin Sliding Scale Info: See dc summary for dose Isolation Precautions Info: MRSA     Current Medications (05/03/2020):  This is the current hospital active medication list Current Facility-Administered Medications  Medication Dose Route Frequency Provider Last Rate Last Admin  . acetaminophen (TYLENOL) tablet 650 mg  650 mg Oral Q6H PRN Cristal Ford, DO   650 mg at 04/30/20 2226   Or  . acetaminophen (TYLENOL) suppository 650 mg  650 mg Rectal Q6H PRN Cristal Ford, DO      . albuterol (PROVENTIL) (2.5 MG/3ML) 0.083% nebulizer solution 2.5 mg  2.5 mg Nebulization Q6H PRN Cristal Ford, DO      . atorvastatin (LIPITOR) tablet 10 mg  10 mg Oral QHS Mikhail, Velta Addison, DO   10 mg at 05/02/20 2114  . buPROPion (WELLBUTRIN XL) 24 hr tablet 450 mg  450 mg Oral Daily Cristal Ford, DO   450 mg at 05/03/20 0630  . carvedilol (COREG) tablet 12.5 mg  12.5 mg Oral BID WC [place] Eric J, DO   12.5 mg at 05/03/20 0919  . docusate sodium (COLACE) capsule 100 mg  100 mg Oral Daily Cristal Ford, DO   100 mg at 05/03/20 0919  . ezetimibe (ZETIA) tablet 10 mg  10 mg Oral Daily Cristal Ford, DO   10 mg at 05/03/20 0919  . gabapentin (NEURONTIN) capsule 300 mg  300 mg Oral BID WC Mikhail, [place], DO   300 mg at 05/03/20 1228  . gabapentin (NEURONTIN) tablet 600 mg  600 mg Oral QHS Mikhail, [place], DO   600 mg at 05/02/20 2114  . insulin aspart (novoLOG) injection 0-5 Units  0-5 Units Subcutaneous QHS Mikhail, Maryann, DO      . insulin aspart (novoLOG) injection 0-9 Units  0-9 Units Subcutaneous TID WC Cristal Ford, DO   1 Units at 05/03/20 1228  . insulin glargine (LANTUS) injection 7 Units  7 Units Subcutaneous QHS Cristal Ford, DO   7 Units at 05/01/20 2122  . levothyroxine (SYNTHROID) tablet 88 mcg  88 mcg Oral QAC breakfast Cristal Ford, DO   88 mcg at 05/03/20 1601  . LORazepam (ATIVAN) tablet 0.5 mg  0.5 mg Oral Q8H PRN Cristal Ford, DO   0.5 mg at  04/28/20 2203  . meclizine (ANTIVERT) tablet 25 mg  25 mg Oral QHS PRN Mikhail, Velta Addison, DO      . ondansetron [facility]) tablet 4 mg  4 mg Oral Q6H PRN Cristal Ford, DO       Or  . ondansetron (ZOFRAN) injection 4 mg  4 mg Intravenous Q6H PRN Mikhail, Maryann, DO      . polyethylene glycol (MIRALAX / GLYCOLAX) packet 17 g  17 g Oral Daily PRN Mikhail, Velta Addison, DO      . polyvinyl alcohol (LIQUIFILM TEARS) 1.4 % ophthalmic solution 1 drop  1 drop Both Eyes QID PRN Rumbarger, Valeda Malm, RPH      . rivaroxaban (XARELTO) tablet 20 mg  20 mg Oral QPM Cristal Ford, DO   20 mg at 05/02/20 2114  . sertraline (ZOLOFT) tablet 75 mg  75 mg Oral Daily Cristal Ford, DO   75 mg at 05/03/20 0932     Discharge Medications: Please see discharge summary for a list of discharge medications.  Relevant Imaging  Results:  Relevant Lab Results:   Additional Information SS#: [id number]  Benard [place], LCSW

## 2020-05-03 NOTE — Care Management Important Message (Signed)
Important Message  Patient Details  Name: Dawn Morrow MRN: [id number] Date of Birth: December 18, 1935   Medicare Important Message Given:  Yes     Shelda Altes 05/03/2020, 11:21 AM

## 2020-05-03 NOTE — Progress Notes (Signed)
Physical Therapy Treatment Patient Details Name: Dawn Morrow MRN: [id number] DOB: July 29, 1936 Today's Date: 05/03/2020    History of Present Illness Pt is an 84 y/o female admitted secondary to acute metabolic encephalopathy; found to have UTI. Pt was noted to have AMS upon arrival to ED. CT head is negative for acute abnormality. PMH includes DM, HTN, COPD, PE, DVT, CKD, and A-fib. Pt had recent placement in hospital due to similar presentation.     PT Comments    Patient progressing slowly towards PT goals. Requires more assist with bed mobility and standing today with use of RW for support and mod A of 2. Marked weakness in BLEs resulting in decreased standing tolerance. Able to take a few steps to get to chair with Min A and assist for RW navigation/balance. Declined walking today. Pt is requiring 5L/min 02 [place], VSS. Pt coughing up red tinged mucus with activity, RN made aware.  WIll continue to follow and progress as tolerated.   Follow Up Recommendations  Other (comment);Supervision/Assistance - 24 hour;Supervision for mobility/OOB (refusing SNF, returning to ALF)     Equipment Recommendations  None recommended by PT    Recommendations for Other Services       Precautions / Restrictions Precautions Precautions: Fall Restrictions Weight Bearing Restrictions: No    Mobility  Bed Mobility Overal bed mobility: Needs Assistance Bed Mobility: Rolling;Sidelying to Sit Rolling: Min assist Sidelying to sit: Min assist;HOB elevated       General bed mobility comments: Step by step cues for sequencing, assist to elevate trunk to get to EOB.  Transfers Overall transfer level: Needs assistance Equipment used: Rolling walker (2 wheeled) Transfers: Sit to/from Stand Sit to Stand: Mod assist;+2 physical assistance         General transfer comment: Assist to power to standing with cues for hand placement, as pt wanting to pull up on RW. Stood from EOB x2, able to take a few steps  to get to chair with Min A for balance, safety, RW management. + coughing up red tinged mucus, RN aware.  Ambulation/Gait             General Gait Details: Pt declined ambulation   Stairs             Wheelchair Mobility    Modified Rankin (Stroke Patients Only)       Balance Overall balance assessment: Needs assistance Sitting-balance support: Feet supported;Bilateral upper extremity supported Sitting balance-Leahy Scale: Fair Sitting balance - Comments: Min guard for safety.   Standing balance support: During functional activity Standing balance-Leahy Scale: Poor Standing balance comment: requires UE support and external support for standing.                            Cognition Arousal/Alertness: Awake/alert Behavior During Therapy: WFL for tasks assessed/performed Overall Cognitive Status: Impaired/Different from baseline Area of Impairment: Attention;Following commands;Memory;Awareness;Safety/judgement                   Current Attention Level: Sustained Memory: Decreased short-term memory Following Commands: Follows one step commands with increased time Safety/Judgement: Decreased awareness of safety;Decreased awareness of deficits Awareness: Intellectual   General Comments: "I just dont know" to lots of questions asked. Requires repetition and redirection to task. Poor awareness of deficits/safety.      Exercises General Exercises - Lower Extremity Ankle Circles/Pumps: AROM;Both;10 reps;Supine Long Arc Quad: AROM;Both;10 reps;Seated    General Comments General comments (skin integrity, edema,  etc.): Requiring 5L/min 02 [place], VSS.      Pertinent Vitals/Pain Pain Assessment: No/denies pain    Home Living                      Prior Function            PT Goals (current goals can now be found in the care plan section) Progress towards PT goals: Progressing toward goals (slowly)    Frequency    Min  2X/week      PT Plan Current plan remains appropriate    Co-evaluation              AM-PAC PT "6 Clicks" Mobility   Outcome Measure  Help needed turning from your back to your side while in a flat bed without using bedrails?: A Little Help needed moving from lying on your back to sitting on the side of a flat bed without using bedrails?: A Lot Help needed moving to and from a bed to a chair (including a wheelchair)?: A Little Help needed standing up from a chair using your arms (e.g., wheelchair or bedside chair)?: A Lot Help needed to walk in hospital room?: A Lot Help needed climbing 3-5 steps with a railing? : Total 6 Click Score: 13    End of Session Equipment Utilized During Treatment: Gait belt;Oxygen Activity Tolerance: Patient limited by fatigue Patient left: in chair;with call bell/phone within reach;with chair alarm set Nurse Communication: Mobility status;Other (comment) (spitting up blood tinged mucus) PT Visit Diagnosis: Unsteadiness on feet (R26.81);Other abnormalities of gait and mobility (R26.89);Muscle weakness (generalized) (M62.81);Difficulty in walking, not elsewhere classified (R26.2)     Time: 6811-5726 PT Time Calculation (min) (ACUTE ONLY): 18 min  Charges:  $Therapeutic Activity: 8-22 mins                     Marisa Severin, PT, DPT Acute Rehabilitation Services Pager [phone] Office [phone]       Marguarite Arbour A Sab[place] Heck 05/03/2020, 12:49 PM

## 2020-05-03 NOTE — Progress Notes (Signed)
PROGRESS NOTE    Dawn Morrow  SFS:[id number] DOB: 27-Nov-1935 DOA: 04/27/2020 PCP: Patient, No Pcp Per    Brief Narrative: Dawn Morrow is a 84 y.o.femalewith a medical history ofhypertension, hyperlipidemia, hypothyroidism, atrial fibrillation, history of PE and DVT, who presented to the emergency department with confusion. She was recently admitted in July 2021 with similar presentation was found to have acute metabolic encephalopathy secondary to Enterococcus faecalis UTI, and patient was placed on vancomycin for 3 days. Patient was discharged to nursing facility and most recently went to [facility] assisted living facility on 04/23/2020. Her niece was visiting with her this morning and noticed that she was completely altered.  Evaluation ED was remarkable for sepsis secondary to UTI and possible pneumonia.   Assessment & Plan:   Active Problems:   Hypothyroidism   COPD (chronic obstructive pulmonary disease) (HCC)   Pulmonary embolism (HCC)   DVT (deep venous thrombosis) (HCC)   Diabetes mellitus type 2 in obese (HCC)   CKD (chronic kidney disease), stage III   Acute kidney failure (HCC)   Acute lower UTI   Atrial fibrillation, chronic (HCC)   Acute metabolic encephalopathy  Severe sepsis secondary to Proteus UTI present on admission: Presented with confusion, leukocytosis tachycardia and acute renal failure. Chest x-ray with increased retrocardiac markings. Blood cultures 1 out of 2 staph epidermidis likely contaminant. Urine culture with Proteus mirabilis resistant to Cipro, nitrofurantoin and Bactrim. Clinically improving. Treated with azithromycin for 5 days and aztreonam for 7 days with penicillin allergy.  Acute metabolic encephalopathy with underlying cognitive decline: Multifactorial.  Probably due to acute infection or hypercarbia.  CT head unremarkable.  No neurological deficit.  Clinically improved.  Acute on chronic hypoxic and hypercarbic respiratory  failure: Uses 3 L of oxygen at home.  Has history of COPD.  Initially placed on BiPAP.  Now declines to use BiPAP at night.  Discontinued.  Keep on oxygen to keep saturations more than 89%.  Acute kidney injury on chronic kidney disease stage IIIb: Baseline creatinine about 1.5.  Improved.  History of PE DVT, chronic A. fib with RVR: Anticoagulated on Xarelto.  Coreg increased to 12.5 mg twice daily.  Hypothyroidism: Continue Synthroid from home.     DVT prophylaxis:  rivaroxaban (XARELTO) tablet 20 mg   Code Status: DNR/DNI Family Communication: Patient's niece at the bedside Disposition Plan: Status is: Inpatient  Remains inpatient appropriate because:IV treatments appropriate due to intensity of illness or inability to take PO   Dispo:  Patient From: [place]  Planned Disposition: [place]  Expected discharge date: 05/03/20  Medically stable for discharge: No          Consultants:   none  Procedures:   None   Antimicrobials:  Antibiotics Given (last 72 hours)    Date/Time Action Medication Dose Rate   04/30/20 1514 New Bag/Given   aztreonam (AZACTAM) 0.5 g in dextrose 5 % 50 mL IVPB 0.5 g 100 mL/hr   04/30/20 1645 New Bag/Given   azithromycin (ZITHROMAX) 500 mg in sodium chloride 0.9 % 250 mL IVPB 500 mg 250 mL/hr   05/01/20 0003 New Bag/Given   aztreonam (AZACTAM) 0.5 g in dextrose 5 % 50 mL IVPB 0.5 g 100 mL/hr   05/01/20 0818 New Bag/Given   aztreonam (AZACTAM) 0.5 g in dextrose 5 % 50 mL IVPB 0.5 g 100 mL/hr   05/01/20 1505 New Bag/Given   aztreonam (AZACTAM) 0.5 g in dextrose 5 % 50 mL IVPB 0.5 g  100 mL/hr   05/01/20 1700 Given   azithromycin (ZITHROMAX) tablet 500 mg 500 mg    05/01/20 2334 New Bag/Given   aztreonam (AZACTAM) 0.5 g in dextrose 5 % 50 mL IVPB 0.5 g 100 mL/hr   05/02/20 1045 New Bag/Given   aztreonam (AZACTAM) 0.5 g in dextrose 5 % 50 mL IVPB 0.5 g 100 mL/hr   05/02/20 1745 New Bag/Given    aztreonam (AZACTAM) 0.5 g in dextrose 5 % 50 mL IVPB 0.5 g 100 mL/hr   05/02/20 2333 New Bag/Given   aztreonam (AZACTAM) 0.5 g in dextrose 5 % 50 mL IVPB 0.5 g 100 mL/hr   05/03/20 0737 New Bag/Given   aztreonam (AZACTAM) 0.5 g in dextrose 5 % 50 mL IVPB 0.5 g 100 mL/hr         Subjective: Patient was seen and examined.  No overnight events.  She refused to wear BiPAP last night.  On 3 to 4 L of oxygen. She is fixated on going back to [place].  The assisted living facility notified us that they cannot meet her demands. Met with patient's niece at the bedside again.  Objective: Vitals:   05/03/20 0347 05/03/20 0900 05/03/20 0913 05/03/20 1216  BP: (!) 158/101 (!) 150/91 (!) 139/99 (!) 158/82  Pulse: (!) 108  (!) 106 83  Resp: 19 18 (!) 31 20  Temp: 97.7 F (36.5 C) 97.6 F (36.4 C) 98.2 F (36.8 C) 97.9 F (36.6 C)  TempSrc: Oral Oral Oral Oral  SpO2: 91%  92% 93%  Weight:      Height:       No intake or output data in the 24 hours ending 05/03/20 1428 Filed Weights   04/27/20 1600  Weight: 94.8 kg    Examination:  General exam: Appears calm and comfortable  Chronically sick looking.  Not in any distress. Patient is pleasant, alert oriented x3.  She is impulsive at times. Respiratory system: Clear to auscultation. Respiratory effort normal.  No added sound. Cardiovascular system: S1 & S2 heard, RRR. No JVD, murmurs, rubs, gallops or clicks. No pedal edema. Gastrointestinal system: Abdomen is nondistended, soft and nontender. No organomegaly or masses felt. Normal bowel sounds heard. Central nervous system: Alert and oriented. No focal neurological deficits. Generalized weakness.    Data Reviewed: I have personally reviewed following labs and imaging studies  CBC: Recent Labs  Lab 04/27/20 1259 04/27/20 1310 04/27/20 1359 04/27/20 1708 04/28/20 0316 04/29/20 0718 05/01/20 0328  WBC 16.8*  --   --   --  15.1* 9.7 8.1  NEUTROABS 14.4*  --   --   --   --    --   --   HGB 12.7   < > 14.3 12.6 11.7* 11.3* 11.1*  HCT 42.6   < > 42.0 37.0 39.3 37.8 36.3  MCV 95.7  --   --   --  95.6 95.9 96.0  PLT 180  --   --   --  163 156 171   < > = values in this interval not displayed.   Basic Metabolic Panel: Recent Labs  Lab 04/28/20 0316 04/29/20 0718 04/30/20 0321 05/01/20 0328 05/02/20 0253  NA 142 139 141 141 141  K 4.3 4.5 4.3 4.0 4.4  CL 105 105 107 107 105  CO2 $Re'29 27 26 25 25  'XPY$ GLUCOSE 85 88 81 110* 113*  BUN 28* $Remov'20 16 15 14  'DTeTpT$ CREATININE 1.58* 1.29* 1.07* 1.15* 1.08*  CALCIUM 8.4* 8.0* 8.1* 8.3*  8.6*   GFR: Estimated Creatinine Clearance: 44.8 mL/min (A) (by C-G formula based on SCr of 1.08 mg/dL (H)). Liver Function Tests: Recent Labs  Lab 04/27/20 1259 04/28/20 0316  AST 19 16  ALT 21 20  ALKPHOS 70 65  BILITOT 1.0 0.8  PROT 6.6 6.4*  ALBUMIN 3.1* 2.9*   No results for input(s): LIPASE, AMYLASE in the last 168 hours. No results for input(s): AMMONIA in the last 168 hours. Coagulation Profile: Recent Labs  Lab 04/27/20 1259 04/28/20 0316  INR 1.9* 3.3*   Cardiac Enzymes: Recent Labs  Lab 04/27/20 1259  CKTOTAL 42   BNP (last 3 results) No results for input(s): PROBNP in the last 8760 hours. HbA1C: No results for input(s): HGBA1C in the last 72 hours. CBG: Recent Labs  Lab 05/02/20 1611 05/02/20 2109 05/03/20 0624 05/03/20 0813 05/03/20 1215  GLUCAP 97 83 95 101* 143*   Lipid Profile: No results for input(s): CHOL, HDL, LDLCALC, TRIG, CHOLHDL, LDLDIRECT in the last 72 hours. Thyroid Function Tests: No results for input(s): TSH, T4TOTAL, FREET4, T3FREE, THYROIDAB in the last 72 hours. Anemia Panel: No results for input(s): VITAMINB12, FOLATE, FERRITIN, TIBC, IRON, RETICCTPCT in the last 72 hours. Sepsis Labs: Recent Labs  Lab 04/27/20 1259 04/27/20 1446 04/28/20 0316  PROCALCITON  --   --  <0.10  LATICACIDVEN 1.4 1.2  --     Recent Results (from the past 240 hour(s))  SARS Coronavirus 2 by RT PCR  (hospital order, performed in [facility] hospital lab) Nasopharyngeal Nasopharyngeal Swab     Status: None   Collection Time: 04/27/20 12:58 PM   Specimen: Nasopharyngeal Swab  Result Value Ref Range Status   SARS Coronavirus 2 NEGATIVE NEGATIVE Final    Comment: (NOTE) SARS-CoV-2 target nucleic acids are NOT DETECTED.  The SARS-CoV-2 RNA is generally detectable in upper and lower respiratory specimens during the acute phase of infection. The lowest concentration of SARS-CoV-2 viral copies this assay can detect is 250 copies / mL. A negative result does not preclude SARS-CoV-2 infection and should not be used as the sole basis for treatment or other patient management decisions.  A negative result may occur with improper specimen collection / handling, submission of specimen other than nasopharyngeal swab, presence of viral mutation(s) within the areas targeted by this assay, and inadequate number of viral copies (<250 copies / mL). A negative result must be combined with clinical observations, patient history, and epidemiological information.  Fact Sheet for Patients:   StrictlyIdeas.no  Fact Sheet for Healthcare Providers: BankingDealers.co.za  This test is not yet approved or  cleared by the [place] FDA and has been authorized for detection and/or diagnosis of SARS-CoV-2 by FDA under an Emergency Use Authorization (EUA).  This EUA will remain in effect (meaning this test can be used) for the duration of the COVID-19 declaration under Section 564(b)(1) of the Act, 21 U.S.C. section 360bbb-3(b)(1), unless the authorization is terminated or revoked sooner.  Performed at [facility], [place] [address], [place], [place] [address]   Culture, blood (routine x 2)     Status: None   Collection Time: 04/27/20 12:59 PM   Specimen: BLOOD RIGHT FOREARM  Result Value Ref Range Status   Specimen Description BLOOD RIGHT FOREARM   Final   Special Requests   Final    BOTTLES DRAWN AEROBIC AND ANAEROBIC Blood Culture adequate volume   Culture   Final    NO GROWTH 5 DAYS Performed at [facility], [address], [place], [place] [address]    Report Status 05/02/2020 FINAL  Final  Urine culture     Status: Abnormal   Collection Time: 04/27/20  1:03 PM   Specimen: Urine, Random  Result Value Ref Range Status   Specimen Description URINE, RANDOM  Final   Special Requests   Final    NONE Performed at [facility], [place] [address] [place], [place] [address]    Culture >=100,000 COLONIES/mL PROTEUS MIRABILIS (A)  Final   Report Status 04/30/2020 FINAL  Final   Organism ID, Bacteria PROTEUS MIRABILIS (A)  Final      Susceptibility   Proteus mirabilis - MIC*    AMPICILLIN <=2 SENSITIVE Sensitive     CEFAZOLIN <=4 SENSITIVE Sensitive     CEFTRIAXONE <=0.25 SENSITIVE Sensitive     CIPROFLOXACIN >=4 RESISTANT Resistant     GENTAMICIN <=1 SENSITIVE Sensitive     IMIPENEM 2 SENSITIVE Sensitive     NITROFURANTOIN 128 RESISTANT Resistant     TRIMETH/SULFA >=320 RESISTANT Resistant     AMPICILLIN/SULBACTAM <=2 SENSITIVE Sensitive     PIP/TAZO <=4 SENSITIVE Sensitive     * >=100,000 COLONIES/mL PROTEUS MIRABILIS  Culture, blood (routine x 2)     Status: Abnormal   Collection Time: 04/27/20  2:32 PM   Specimen: BLOOD  Result Value Ref Range Status   Specimen Description BLOOD SITE NOT SPECIFIED  Final   Special Requests   Final    BOTTLES DRAWN AEROBIC AND ANAEROBIC Blood Culture adequate volume   Culture  Setup Time   Final    GRAM POSITIVE COCCI IN CLUSTERS AEROBIC BOTTLE ONLY CRITICAL RESULT CALLED TO, READ BACK BY AND VERIFIED WITH: PHARMD D PIERCE AT 2023 04/28/20 BY L BENFIELD    Culture (A)  Final    STAPHYLOCOCCUS EPIDERMIDIS THE SIGNIFICANCE OF ISOLATING THIS ORGANISM FROM A SINGLE SET OF BLOOD CULTURES WHEN MULTIPLE SETS ARE DRAWN IS UNCERTAIN. PLEASE NOTIFY THE MICROBIOLOGY DEPARTMENT WITHIN  ONE WEEK IF SPECIATION AND SENSITIVITIES ARE REQUIRED. Performed at [facility], [place] [address], [place], [place] [address]    Report Status 04/30/2020 FINAL  Final  Blood Culture ID Panel (Reflexed)     Status: Abnormal   Collection Time: 04/27/20  2:32 PM  Result Value Ref Range Status   Enterococcus faecalis NOT DETECTED NOT DETECTED Final   Enterococcus Faecium NOT DETECTED NOT DETECTED Final   Listeria monocytogenes NOT DETECTED NOT DETECTED Final   Staphylococcus species DETECTED (A) NOT DETECTED Final    Comment: CRITICAL RESULT CALLED TO, READ BACK BY AND VERIFIED WITH: PHARMD D PIERCE AT 2023 04/28/20 BY L BENFIELD    Staphylococcus aureus (BCID) NOT DETECTED NOT DETECTED Final   Staphylococcus epidermidis DETECTED (A) NOT DETECTED Final    Comment: Methicillin (oxacillin) resistant coagulase negative staphylococcus. Possible blood culture contaminant (unless isolated from more than one blood culture draw or clinical case suggests pathogenicity). No antibiotic treatment is indicated for blood  culture contaminants. CRITICAL RESULT CALLED TO, READ BACK BY AND VERIFIED WITH: PHARMD D PIERCE AT 2023 04/28/20 BY L BENFIELD    Staphylococcus lugdunensis NOT DETECTED NOT DETECTED Final   Streptococcus species NOT DETECTED NOT DETECTED Final   Streptococcus agalactiae NOT DETECTED NOT DETECTED Final   Streptococcus pneumoniae NOT DETECTED NOT DETECTED Final   Streptococcus pyogenes NOT DETECTED NOT DETECTED Final   A.calcoaceticus-baumannii NOT DETECTED NOT DETECTED Final   Bacteroides fragilis NOT DETECTED NOT DETECTED Final  Enterobacterales NOT DETECTED NOT DETECTED Final   Enterobacter cloacae complex NOT DETECTED NOT DETECTED Final   Escherichia coli NOT DETECTED NOT DETECTED Final   Klebsiella aerogenes NOT DETECTED NOT DETECTED Final   Klebsiella oxytoca NOT DETECTED NOT DETECTED Final   Klebsiella pneumoniae NOT DETECTED NOT DETECTED Final   Proteus species NOT  DETECTED NOT DETECTED Final   Salmonella species NOT DETECTED NOT DETECTED Final   Serratia marcescens NOT DETECTED NOT DETECTED Final   Haemophilus influenzae NOT DETECTED NOT DETECTED Final   Neisseria meningitidis NOT DETECTED NOT DETECTED Final   Pseudomonas aeruginosa NOT DETECTED NOT DETECTED Final   Stenotrophomonas maltophilia NOT DETECTED NOT DETECTED Final   Candida albicans NOT DETECTED NOT DETECTED Final   Candida auris NOT DETECTED NOT DETECTED Final   Candida glabrata NOT DETECTED NOT DETECTED Final   Candida krusei NOT DETECTED NOT DETECTED Final   Candida parapsilosis NOT DETECTED NOT DETECTED Final   Candida tropicalis NOT DETECTED NOT DETECTED Final   Cryptococcus neoformans/gattii NOT DETECTED NOT DETECTED Final   Methicillin resistance mecA/C DETECTED (A) NOT DETECTED Final    Comment: CRITICAL RESULT CALLED TO, READ BACK BY AND VERIFIED WITH: PHARMD D PIERCE AT 2023 04/28/20 BY L BENFIELD Performed at [facility], [address], [place], [place] [address]   MRSA PCR Screening     Status: None   Collection Time: 04/28/20  4:01 AM   Specimen: Nasopharyngeal  Result Value Ref Range Status   MRSA by PCR NEGATIVE NEGATIVE Final    Comment:        The GeneXpert MRSA Assay (FDA approved for NASAL specimens only), is one component of a comprehensive MRSA colonization surveillance program. It is not intended to diagnose MRSA infection nor to guide or monitor treatment for MRSA infections. Performed at [facility], [place] [address], [place],  [address]          Radiology Studies: No results found.      Scheduled Meds: . atorvastatin  10 mg Oral QHS  . buPROPion  450 mg Oral Daily  . carvedilol  12.5 mg Oral BID WC  . docusate sodium  100 mg Oral Daily  . ezetimibe  10 mg Oral Daily  . gabapentin  300 mg Oral BID WC  . gabapentin  600 mg Oral QHS  . insulin aspart  0-5 Units Subcutaneous QHS  . insulin aspart  0-9 Units  Subcutaneous TID WC  . insulin glargine  7 Units Subcutaneous QHS  . levothyroxine  88 mcg Oral QAC breakfast  . rivaroxaban  20 mg Oral QPM  . sertraline  75 mg Oral Daily   Continuous Infusions:   LOS: 6 days    Time spent: 32 minutes    Barb Merino, MD [facility] Pager [phone]

## 2020-05-04 LAB — GLUCOSE, CAPILLARY
Glucose-Capillary: 95 mg/dL (ref 70–99)
Glucose-Capillary: 113 mg/dL — ABNORMAL HIGH (ref 70–99)
Glucose-Capillary: 108 mg/dL — ABNORMAL HIGH (ref 70–99)
Glucose-Capillary: 120 mg/dL — ABNORMAL HIGH (ref 70–99)

## 2020-05-04 NOTE — Discharge Instructions (Signed)

## 2020-05-04 NOTE — Progress Notes (Signed)
PROGRESS NOTE    Dawn Morrow  ZOX:[id number] DOB: [birth year] DOA: 04/27/2020 PCP: Patient, No Pcp Per    Brief Narrative: Dawn Morrow is a 84 y.o.femalewith a medical history ofhypertension, hyperlipidemia, hypothyroidism, atrial fibrillation, history of PE and DVT, who presented to the emergency department with confusion. She was recently admitted in July 2021 with similar presentation was found to have acute metabolic encephalopathy secondary to Enterococcus faecalis UTI, and patient was placed on vancomycin for 3 days. Patient was discharged to nursing facility and most recently went to [facility] assisted living facility on 04/23/2020. Her niece was visiting with her this morning and noticed that she was completely altered.  Evaluation ED was remarkable for sepsis secondary to UTI and possible pneumonia.   Assessment & Plan:   Active Problems:   Hypothyroidism   COPD (chronic obstructive pulmonary disease) (HCC)   Pulmonary embolism (HCC)   DVT (deep venous thrombosis) (HCC)   Diabetes mellitus type 2 in obese (HCC)   CKD (chronic kidney disease), stage III   Acute kidney failure (HCC)   Acute lower UTI   Atrial fibrillation, chronic (HCC)   Acute metabolic encephalopathy  Severe sepsis secondary to Proteus UTI present on admission: Presented with confusion, leukocytosis tachycardia and acute renal failure. Chest x-ray with increased retrocardiac markings. Blood cultures 1 out of 2 staph epidermidis likely contaminant. Urine culture with Proteus mirabilis resistant to Cipro, nitrofurantoin and Bactrim. Clinically improving. Treated with azithromycin for 5 days and aztreonam for 7 days with penicillin allergy. Patient finished antibiotic therapy.  Acute metabolic encephalopathy with underlying cognitive decline: Multifactorial.  Probably due to acute infection or hypercarbia.  CT head unremarkable.  No neurological deficit.  Clinically improved.  Acute on chronic hypoxic  and hypercarbic respiratory failure: Uses 3 L of oxygen at home.  Has history of COPD.  Initially placed on BiPAP.  Now declines to use BiPAP at night.  Discontinued.  Keep on oxygen to keep saturations more than 89%.  Acute kidney injury on chronic kidney disease stage IIIb: Baseline creatinine about 1.5.  Improved.  History of PE DVT, chronic A. fib with RVR: Anticoagulated on Xarelto.  Coreg increased to 12.5 mg twice daily.  Hypothyroidism: Continue Synthroid from home.  Advanced physical deconditioning and debility: From assisted living facility.  They're not able to meet her care demands. Waiting for skilled nursing facility availability.   DVT prophylaxis:  rivaroxaban (XARELTO) tablet 20 mg   Code Status: DNR/DNI Family Communication: Patient's niece at the bedside 8/27. Disposition Plan: Status is: Inpatient  Remains inpatient appropriate because; unsafe disposition. Dispo:  Patient From: [facility]  Planned Disposition: [place]  Expected discharge date: 05/03/20, when bed is available.  Medically stable for discharge: Yes to a skilled nursing facility.         Consultants:   none  Procedures:   None   Antimicrobials:  Antibiotics Given (last 72 hours)    Date/Time Action Medication Dose Rate   05/01/20 1505 New Bag/Given   aztreonam (AZACTAM) 0.5 g in dextrose 5 % 50 mL IVPB 0.5 g 100 mL/hr   05/01/20 1700 Given   azithromycin (ZITHROMAX) tablet 500 mg 500 mg    05/01/20 2334 New Bag/Given   aztreonam (AZACTAM) 0.5 g in dextrose 5 % 50 mL IVPB 0.5 g 100 mL/hr   05/02/20 1045 New Bag/Given   aztreonam (AZACTAM) 0.5 g in dextrose 5 % 50 mL IVPB 0.5 g 100 mL/hr   05/02/20 1745 New Bag/Given  aztreonam (AZACTAM) 0.5 g in dextrose 5 % 50 mL IVPB 0.5 g 100 mL/hr   05/02/20 2333 New Bag/Given   aztreonam (AZACTAM) 0.5 g in dextrose 5 % 50 mL IVPB 0.5 g 100 mL/hr   05/03/20 3016 New Bag/Given   aztreonam (AZACTAM) 0.5 g in  dextrose 5 % 50 mL IVPB 0.5 g 100 mL/hr         Subjective: Patient seen and examined.  No overnight events.  Still on 3 to 4 L of oxygen.  She was very pleasant and thankful.  She wants to go back to [facility] but again she knows that she cannot go back now.  Objective: Vitals:   05/03/20 2332 05/04/20 0352 05/04/20 0813 05/04/20 1117  BP: 124/84 (!) 166/100 (!) 145/93 (!) 145/75  Pulse: 86 78 (!) 115 81  Resp: 19 17 17 20   Temp: 97.8 F (36.6 C) 98 F (36.7 C) 97.8 F (36.6 C) 97.9 F (36.6 C)  TempSrc: Oral Oral Oral Oral  SpO2: 94% 93%  93%  Weight:      Height:        Intake/Output Summary (Last 24 hours) at 05/04/2020 1128 Last data filed at 05/03/2020 1738 Gross per 24 hour  Intake 240 ml  Output 250 ml  Net -10 ml   Filed Weights   04/27/20 1600  Weight: 94.8 kg    Examination:  General exam: Appears calm and comfortable  Chronically sick looking.  Not in any distress. Patient is pleasant, alert oriented x2-3.  Respiratory system: Clear to auscultation. Respiratory effort normal.  No added sound. Cardiovascular system: S1 & S2 heard, RRR. No JVD, murmurs, rubs, gallops or clicks. No pedal edema. Gastrointestinal system: Abdomen is nondistended, soft and nontender. No organomegaly or masses felt. Normal bowel sounds heard. Central nervous system: Alert and oriented. No focal neurological deficits. Generalized weakness.    Data Reviewed: I have personally reviewed following labs and imaging studies  CBC: Recent Labs  Lab 04/27/20 1259 04/27/20 1310 04/27/20 1359 04/27/20 1708 04/28/20 0316 04/29/20 0718 05/01/20 0328  WBC 16.8*  --   --   --  15.1* 9.7 8.1  NEUTROABS 14.4*  --   --   --   --   --   --   HGB 12.7   < > 14.3 12.6 11.7* 11.3* 11.1*  HCT 42.6   < > 42.0 37.0 39.3 37.8 36.3  MCV 95.7  --   --   --  95.6 95.9 96.0  PLT 180  --   --   --  163 156 171   < > = values in this interval not displayed.   Basic Metabolic Panel: Recent  Labs  Lab 04/28/20 0316 04/29/20 0718 04/30/20 0321 05/01/20 0328 05/02/20 0253  NA 142 139 141 141 141  K 4.3 4.5 4.3 4.0 4.4  CL 105 105 107 107 105  CO2 29 27 26 25 25   GLUCOSE 85 88 81 110* 113*  BUN 28* 20 16 15 14   CREATININE 1.58* 1.29* 1.07* 1.15* 1.08*  CALCIUM 8.4* 8.0* 8.1* 8.3* 8.6*   GFR: Estimated Creatinine Clearance: 44.8 mL/min (A) (by C-G formula based on SCr of 1.08 mg/dL (H)). Liver Function Tests: Recent Labs  Lab 04/27/20 1259 04/28/20 0316  AST 19 16  ALT 21 20  ALKPHOS 70 65  BILITOT 1.0 0.8  PROT 6.6 6.4*  ALBUMIN 3.1* 2.9*   No results for input(s): LIPASE, AMYLASE in the last 168 hours. No results  for input(s): AMMONIA in the last 168 hours. Coagulation Profile: Recent Labs  Lab 04/27/20 1259 04/28/20 0316  INR 1.9* 3.3*   Cardiac Enzymes: Recent Labs  Lab 04/27/20 1259  CKTOTAL 42   BNP (last 3 results) No results for input(s): PROBNP in the last 8760 hours. HbA1C: No results for input(s): HGBA1C in the last 72 hours. CBG: Recent Labs  Lab 05/03/20 1215 05/03/20 1724 05/03/20 2111 05/04/20 0556 05/04/20 1117  GLUCAP 143* 109* 121* 95 113*   Lipid Profile: No results for input(s): CHOL, HDL, LDLCALC, TRIG, CHOLHDL, LDLDIRECT in the last 72 hours. Thyroid Function Tests: No results for input(s): TSH, T4TOTAL, FREET4, T3FREE, THYROIDAB in the last 72 hours. Anemia Panel: No results for input(s): VITAMINB12, FOLATE, FERRITIN, TIBC, IRON, RETICCTPCT in the last 72 hours. Sepsis Labs: Recent Labs  Lab 04/27/20 1259 04/27/20 1446 04/28/20 0316  PROCALCITON  --   --  <0.10  LATICACIDVEN 1.4 1.2  --     Recent Results (from the past 240 hour(s))  SARS Coronavirus 2 by RT PCR (hospital order, performed in [facility] hospital lab) Nasopharyngeal Nasopharyngeal Swab     Status: None   Collection Time: 04/27/20 12:58 PM   Specimen: Nasopharyngeal Swab  Result Value Ref Range Status   SARS Coronavirus 2 NEGATIVE NEGATIVE  Final    Comment: (NOTE) SARS-CoV-2 target nucleic acids are NOT DETECTED.  The SARS-CoV-2 RNA is generally detectable in upper and lower respiratory specimens during the acute phase of infection. The lowest concentration of SARS-CoV-2 viral copies this assay can detect is 250 copies / mL. A negative result does not preclude SARS-CoV-2 infection and should not be used as the sole basis for treatment or other patient management decisions.  A negative result may occur with improper specimen collection / handling, submission of specimen other than nasopharyngeal swab, presence of viral mutation(s) within the areas targeted by this assay, and inadequate number of viral copies (<250 copies / mL). A negative result must be combined with clinical observations, patient history, and epidemiological information.  Fact Sheet for Patients:   StrictlyIdeas.no  Fact Sheet for Healthcare Providers: BankingDealers.co.za  This test is not yet approved or  cleared by the [place] FDA and has been authorized for detection and/or diagnosis of SARS-CoV-2 by FDA under an Emergency Use Authorization (EUA).  This EUA will remain in effect (meaning this test can be used) for the duration of the COVID-19 declaration under Section 564(b)(1) of the Act, 21 U.S.C. section 360bbb-3(b)(1), unless the authorization is terminated or revoked sooner.  Performed at [facility], [place] [address] [place], [place] [address]   Culture, blood (routine x 2)     Status: None   Collection Time: 04/27/20 12:59 PM   Specimen: BLOOD RIGHT FOREARM  Result Value Ref Range Status   Specimen Description BLOOD RIGHT FOREARM  Final   Special Requests   Final    BOTTLES DRAWN AEROBIC AND ANAEROBIC Blood Culture adequate volume   Culture   Final    NO GROWTH 5 DAYS Performed at [facility], [place] [address], [place], [place] [address]    Report Status 05/02/2020  FINAL  Final  Urine culture     Status: Abnormal   Collection Time: 04/27/20  1:03 PM   Specimen: Urine, Random  Result Value Ref Range Status   Specimen Description URINE, RANDOM  Final   Special Requests   Final    NONE Performed at [facility],  [address], [place], [place] [address]    Culture >=100,000 COLONIES/mL PROTEUS MIRABILIS (A)  Final   Report Status 04/30/2020 FINAL  Final   Organism ID, Bacteria PROTEUS MIRABILIS (A)  Final      Susceptibility   Proteus mirabilis - MIC*    AMPICILLIN <=2 SENSITIVE Sensitive     CEFAZOLIN <=4 SENSITIVE Sensitive     CEFTRIAXONE <=0.25 SENSITIVE Sensitive     CIPROFLOXACIN >=4 RESISTANT Resistant     GENTAMICIN <=1 SENSITIVE Sensitive     IMIPENEM 2 SENSITIVE Sensitive     NITROFURANTOIN 128 RESISTANT Resistant     TRIMETH/SULFA >=320 RESISTANT Resistant     AMPICILLIN/SULBACTAM <=2 SENSITIVE Sensitive     PIP/TAZO <=4 SENSITIVE Sensitive     * >=100,000 COLONIES/mL PROTEUS MIRABILIS  Culture, blood (routine x 2)     Status: Abnormal   Collection Time: 04/27/20  2:32 PM   Specimen: BLOOD  Result Value Ref Range Status   Specimen Description BLOOD SITE NOT SPECIFIED  Final   Special Requests   Final    BOTTLES DRAWN AEROBIC AND ANAEROBIC Blood Culture adequate volume   Culture  Setup Time   Final    GRAM POSITIVE COCCI IN CLUSTERS AEROBIC BOTTLE ONLY CRITICAL RESULT CALLED TO, READ BACK BY AND VERIFIED WITH: PHARMD D PIERCE AT 2023 04/28/20 BY L BENFIELD    Culture (A)  Final    STAPHYLOCOCCUS EPIDERMIDIS THE SIGNIFICANCE OF ISOLATING THIS ORGANISM FROM A SINGLE SET OF BLOOD CULTURES WHEN MULTIPLE SETS ARE DRAWN IS UNCERTAIN. PLEASE NOTIFY THE MICROBIOLOGY DEPARTMENT WITHIN ONE WEEK IF SPECIATION AND SENSITIVITIES ARE REQUIRED. Performed at [facility], [place] [address], [place], [place] [address]    Report Status 04/30/2020 FINAL  Final  Blood Culture ID Panel (Reflexed)     Status: Abnormal   Collection Time:  04/27/20  2:32 PM  Result Value Ref Range Status   Enterococcus faecalis NOT DETECTED NOT DETECTED Final   Enterococcus Faecium NOT DETECTED NOT DETECTED Final   Listeria monocytogenes NOT DETECTED NOT DETECTED Final   Staphylococcus species DETECTED (A) NOT DETECTED Final    Comment: CRITICAL RESULT CALLED TO, READ BACK BY AND VERIFIED WITH: PHARMD D PIERCE AT 2023 04/28/20 BY L BENFIELD    Staphylococcus aureus (BCID) NOT DETECTED NOT DETECTED Final   Staphylococcus epidermidis DETECTED (A) NOT DETECTED Final    Comment: Methicillin (oxacillin) resistant coagulase negative staphylococcus. Possible blood culture contaminant (unless isolated from more than one blood culture draw or clinical case suggests pathogenicity). No antibiotic treatment is indicated for blood  culture contaminants. CRITICAL RESULT CALLED TO, READ BACK BY AND VERIFIED WITH: PHARMD D PIERCE AT 2023 04/28/20 BY L BENFIELD    Staphylococcus lugdunensis NOT DETECTED NOT DETECTED Final   Streptococcus species NOT DETECTED NOT DETECTED Final   Streptococcus agalactiae NOT DETECTED NOT DETECTED Final   Streptococcus pneumoniae NOT DETECTED NOT DETECTED Final   Streptococcus pyogenes NOT DETECTED NOT DETECTED Final   A.calcoaceticus-baumannii NOT DETECTED NOT DETECTED Final   Bacteroides fragilis NOT DETECTED NOT DETECTED Final   Enterobacterales NOT DETECTED NOT DETECTED Final   Enterobacter cloacae complex NOT DETECTED NOT DETECTED Final   Escherichia coli NOT DETECTED NOT DETECTED Final   Klebsiella aerogenes NOT DETECTED NOT DETECTED Final   Klebsiella oxytoca NOT DETECTED NOT DETECTED Final   Klebsiella pneumoniae NOT DETECTED NOT DETECTED Final   Proteus species NOT DETECTED NOT DETECTED Final   Salmonella species NOT DETECTED NOT DETECTED Final  Serratia marcescens NOT DETECTED NOT DETECTED Final   Haemophilus influenzae NOT DETECTED NOT DETECTED Final   Neisseria meningitidis NOT DETECTED NOT DETECTED Final    Pseudomonas aeruginosa NOT DETECTED NOT DETECTED Final   Stenotrophomonas maltophilia NOT DETECTED NOT DETECTED Final   Candida albicans NOT DETECTED NOT DETECTED Final   Candida auris NOT DETECTED NOT DETECTED Final   Candida glabrata NOT DETECTED NOT DETECTED Final   Candida krusei NOT DETECTED NOT DETECTED Final   Candida parapsilosis NOT DETECTED NOT DETECTED Final   Candida tropicalis NOT DETECTED NOT DETECTED Final   Cryptococcus neoformans/gattii NOT DETECTED NOT DETECTED Final   Methicillin resistance mecA/C DETECTED (A) NOT DETECTED Final    Comment: CRITICAL RESULT CALLED TO, READ BACK BY AND VERIFIED WITH: PHARMD D PIERCE AT 2023 04/28/20 BY L BENFIELD Performed at [facility], [place] [address], [place], [place] [address]   MRSA PCR Screening     Status: None   Collection Time: 04/28/20  4:01 AM   Specimen: Nasopharyngeal  Result Value Ref Range Status   MRSA by PCR NEGATIVE NEGATIVE Final    Comment:        The GeneXpert MRSA Assay (FDA approved for NASAL specimens only), is one component of a comprehensive MRSA colonization surveillance program. It is not intended to diagnose MRSA infection nor to guide or monitor treatment for MRSA infections. Performed at [facility], [place] [address], [place], [place] [address]   SARS CORONAVIRUS 2 (TAT 6-24 HRS) Nasopharyngeal Nasopharyngeal Swab     Status: None   Collection Time: 05/03/20  3:00 PM   Specimen: Nasopharyngeal Swab  Result Value Ref Range Status   SARS Coronavirus 2 NEGATIVE NEGATIVE Final    Comment: (NOTE) SARS-CoV-2 target nucleic acids are NOT DETECTED.  The SARS-CoV-2 RNA is generally detectable in upper and lower respiratory specimens during the acute phase of infection. Negative results do not preclude SARS-CoV-2 infection, do not rule out co-infections with other pathogens, and should not be used as the sole basis for treatment or other patient management decisions. Negative results must be  combined with clinical observations, patient history, and epidemiological information. The expected result is Negative.  Fact Sheet for Patients: SugarRoll.be  Fact Sheet for Healthcare Providers: https://www.woods-mathews.com/  This test is not yet approved or cleared by the [place] FDA and  has been authorized for detection and/or diagnosis of SARS-CoV-2 by FDA under an Emergency Use Authorization (EUA). This EUA will remain  in effect (meaning this test can be used) for the duration of the COVID-19 declaration under Se ction 564(b)(1) of the Act, 21 U.S.C. section 360bbb-3(b)(1), unless the authorization is terminated or revoked sooner.  Performed at [facility], [place] [address] [place], [place] [address]          Radiology Studies: No results found.      Scheduled Meds: . atorvastatin  10 mg Oral QHS  . buPROPion  450 mg Oral Daily  . carvedilol  12.5 mg Oral BID WC  . docusate sodium  100 mg Oral Daily  . ezetimibe  10 mg Oral Daily  . gabapentin  300 mg Oral BID WC  . gabapentin  600 mg Oral QHS  . insulin aspart  0-5 Units Subcutaneous QHS  . insulin aspart  0-9 Units Subcutaneous TID WC  . insulin glargine  7 Units Subcutaneous QHS  . levothyroxine  88 mcg Oral QAC breakfast  . rivaroxaban  20 mg Oral QPM  .  sertraline  75 mg Oral Daily   Continuous Infusions:   LOS: 7 days    Time spent: 32 minutes    Barb Merino, MD [facility] Pager [phone]

## 2020-05-05 DIAGNOSIS — A419 Sepsis, unspecified organism: Secondary | ICD-10-CM

## 2020-05-05 DIAGNOSIS — R6520 Severe sepsis without septic shock: Secondary | ICD-10-CM

## 2020-05-05 LAB — GLUCOSE, CAPILLARY
Glucose-Capillary: 136 mg/dL — ABNORMAL HIGH (ref 70–99)
Glucose-Capillary: 112 mg/dL — ABNORMAL HIGH (ref 70–99)
Glucose-Capillary: 118 mg/dL — ABNORMAL HIGH (ref 70–99)
Glucose-Capillary: 115 mg/dL — ABNORMAL HIGH (ref 70–99)

## 2020-05-05 NOTE — TOC Initial Note (Signed)
Transition of Care [facility]) - Initial/Assessment Note    Patient Details  Name: Dawn Morrow MRN: [id number] Date of Birth: 1936/06/17  Transition of Care [facility]) CM/SW Contact:    Vinie Sill, [place] Phone Number: 05/05/2020, 10:50 AM  Clinical Narrative:                  CSW called patient's nice,[name] to provide bed offer and get SNF choice. CSW left voice message  to return phone call.   Thurmond Butts, MSW, [place] Clinical Social Worker   Expected Discharge Plan: Assisted Living Barriers to Discharge:  (PT recommends SNF- patient want to return to ALF- waiting to hear back from ALF to determine of they can met her needs)   Patient Goals and CMS Choice Patient states their goals for this hospitalization and ongoing recovery are:: patient wants to return to [facility] " I want to be with my friends"      Expected Discharge Plan and Services Expected Discharge Plan: Assisted Living In-house Referral: Clinical Social Work     Living arrangements for the past 2 months: [place]                                      Prior Living Arrangements/Services Living arrangements for the past 2 months: [place] Lives with:: Self, Facility Resident Patient language and need for interpreter reviewed:: No        Need for Family Participation in Patient Care: Yes (Comment) Care giver support system in place?: Yes (comment)   Criminal Activity/Legal Involvement Pertinent to Current Situation/Hospitalization: No - Comment as needed  Activities of Daily Living      Permission Sought/Granted Permission sought to share information with : Family Supports Permission granted to share information with : Yes, Verbal Permission Granted  Share Information with NAME: Merrilee Jansky     Permission granted to share info w Relationship: niece  Permission granted to share info w Contact Information: [phone]  Emotional Assessment Appearance::  Appears stated age Attitude/Demeanor/Rapport: Engaged (a little confused) Affect (typically observed): Agitated (does not want to go to SNF) Orientation: : Oriented to Self, Oriented to Place, Oriented to  Time Alcohol / Substance Use: Not Applicable Psych Involvement: No (comment)  Admission diagnosis:  CO2 retention [E87.2] Acute UTI [N39.0] Altered mental status, unspecified altered mental status type [J69.67] Acute metabolic encephalopathy [E93.81] Patient Active Problem List   Diagnosis Date Noted  . Acute metabolic encephalopathy 01/75/1025  . AMS (altered mental status) 03/26/2020  . History of pulmonary embolism 03/26/2020  . History of DVT (deep vein thrombosis) 03/26/2020  . Atrial fibrillation, chronic ([place]) 03/26/2020  . Sepsis ([place]) 12/14/2019  . Acute encephalopathy 12/14/2019  . COVID-19 virus infection 10/02/2019  . Acute lower UTI 10/02/2019  . Acute kidney failure ([place]) 10/01/2019  . Syncope 09/18/2017  . Near syncope 09/17/2017  . Diabetes mellitus type 2 in obese ([place]) 09/17/2017  . Atrial fibrillation with RVR ([place]) 09/17/2017  . Hypomagnesemia 09/17/2017  . CKD (chronic kidney disease), stage III 09/17/2017  . Forehead contusion   . MGUS (monoclonal gammopathy of unknown significance) 08/13/2016  . Renal failure 07/09/2015  . Follow-up ---------PCP NOTES 05/17/2015  . Essential tremor 12/28/2014  . Ulnar neuropathy of left upper extremity 12/28/2014  . Intractable pain 06/21/2014  . Back pain 06/21/2014  . Lumbar radiculopathy 06/21/2014  . Encounter for therapeutic drug monitoring 03/14/2014  .  Numbness 10/18/2013  . TIA (transient ischemic attack) 09/19/2013  . Weakness 09/18/2013  . Annual physical exam 05/30/2012  . Tremor 01/21/2012  . Failure to thrive and poor med compliance  01/21/2012  . Pulmonary embolism ([place]) 11/18/2010  . DVT (deep venous thrombosis) ([place]) 11/18/2010  . Long term current use of anticoagulant 11/18/2010  . ABNORMAL  ELECTROCARDIOGRAM 11/10/2010  . OSTEOARTHRITIS 08/06/2010  . DIZZINESS 04/15/2010  . Diabetes ([place]) 06/04/2009  . ACNE ROSACEA 11/28/2008  . BACK PAIN 05/16/2008  . HIP PAIN, RIGHT, CHRONIC 09/15/2007  . Hypothyroidism 09/13/2007  . Dyslipidemia 09/13/2007  . Osteoporosis 07/22/2007  . DEPRESSION 09/11/2006  . HTN (hypertension) 09/11/2006  . ASTHMA 09/11/2006  . COPD (chronic obstructive pulmonary disease) ([place]) 09/11/2006  . GERD 09/11/2006  . Recurrent UTI 09/11/2006  . [place], HX OF 09/11/2006   PCP:  Patient, No Pcp Per Pharmacy:  No Pharmacies Listed    Social Determinants of Health (SDOH) Interventions    Readmission Risk Interventions Readmission Risk Prevention Plan 10/03/2019  Transportation Screening Complete  PCP or Specialist Appt within 3-5 Days Complete  HRI or [place] Not Complete  HRI or Home Care Consult comments Patient at her baseline  Social Work Consult for [place] Planning/Counseling [place] Not Applicable  Medication Review Press photographer) Referral to Pharmacy  Some recent data might be hidden

## 2020-05-05 NOTE — TOC Progression Note (Signed)
Transition of Care [facility]) - Progression Note    Patient Details  Name: Dawn Morrow MRN: [id number] Date of Birth: [birth year]  Transition [facility]) CM/SW [place], [place] Phone Number: 05/05/2020, 4:00 PM  Clinical Narrative:     [facility] has confirmed bed offer and can admit patient tomorrow.  Thurmond Butts, MSW, [place] Clinical Social Worker   Expected Discharge Plan: Assisted Living Barriers to Discharge:  (PT recommends SNF- patient want to return to ALF- waiting to hear back from ALF to determine of they can met her needs)  Expected Discharge Plan and Services Expected Discharge Plan: Assisted Living In-house Referral: Clinical Social Work     Living arrangements for the past 2 months: [place]                                       Social Determinants of Health (SDOH) Interventions    Readmission Risk Interventions Readmission Risk Prevention Plan 10/03/2019  Transportation Screening Complete  PCP or Specialist Appt within 3-5 Days Complete  HRI or [place] Not Complete  HRI or Home Care Consult comments Patient at her baseline  Social Work Consult for [place] Planning/Counseling [place] Screening Not Applicable  Medication Review Press photographer) Referral to Pharmacy  Some recent data might be hidden

## 2020-05-05 NOTE — TOC Progression Note (Signed)
Transition of Care [facility]) - Progression Note    Patient Details  Name: [name] MRN: [id number] Date of Birth: 1935/10/27  Transition of Care [facility]) CM/SW [place], [place] Phone Number: 05/05/2020, 2:41 PM  Clinical Narrative:     CSW spoke with patient's niece, [name]-she  selected [facility] SNF for short term rehab. Patient's niece was visibility upset, almost tearful. She states the patient "blames me". CSW acknowledged her feelings and how difficult it can be to care and make decisions  for loved ones in this situation. CSW encourage her to continue to be supportive and try not to take patient's comments to heart/personal. [name] expressed appreciation for CSW assistance.  CSW has contacted [facility] to confirm availability - CSW waiting on response.   CSW will continue to follow and assist with discharge planning.  Thurmond Butts, MSW, [place] Clinical Social Worker   Expected Discharge Plan: Assisted Living Barriers to Discharge:  (PT recommends SNF- patient want to return to ALF- waiting to hear back from ALF to determine of they can met her needs)  Expected Discharge Plan and Services Expected Discharge Plan: Assisted Living In-house Referral: Clinical Social Work     Living arrangements for the past 2 months: [place]                                       Social Determinants of Health (SDOH) Interventions    Readmission Risk Interventions Readmission Risk Prevention Plan 10/03/2019  Transportation Screening Complete  PCP or Specialist Appt within 3-5 Days Complete  HRI or [place] Not Complete  HRI or Home Care Consult comments Patient at her baseline  Social Work Consult for [place] Planning/Counseling [place] Screening Not Applicable  Medication Review Press photographer) Referral to Pharmacy  Some recent data might be hidden

## 2020-05-05 NOTE — Progress Notes (Signed)
PROGRESS NOTE    Dawn Morrow  OMB:[id number] DOB: [birth year] DOA: 04/27/2020 PCP: Patient, No Pcp Per     Brief Narrative:  Dawn Morrow is an 84 y.o.femalewith a medical history ofhypertension, hyperlipidemia, hypothyroidism, atrial fibrillation, history of PE and DVT, who presented to the emergency department with confusion. She was recently admitted in July 2021 with similar presentation was found to have acute metabolic encephalopathy secondary to Enterococcus faecalis UTI, and patient was placed on vancomycin for 3 days. Patient was discharged to nursing facility and most recently went to [facility] assisted living facility on 04/23/2020. Her niece was visiting with her this morning and noticed that she was completely altered. Evaluation ED was remarkable for sepsis secondary to UTI and possible pneumonia.   New events last 24 hours / Subjective: Patient without any new complaints today.  Denies any shortness of breath.  Assessment & Plan:   Active Problems:   Hypothyroidism   COPD (chronic obstructive pulmonary disease) (HCC)   Pulmonary embolism (HCC)   DVT (deep venous thrombosis) (HCC)   Diabetes mellitus type 2 in obese (HCC)   CKD (chronic kidney disease), stage III   Acute kidney failure (HCC)   Acute lower UTI   Atrial fibrillation, chronic (HCC)   Acute metabolic encephalopathy   Severe sepsis secondary to Proteus UTI present on admission -Presented with confusion, leukocytosis, tachycardia, and acute renal failure. -Chest x-ray with increased retrocardiac markings. -Blood cultures 1 out of 2 staph epidermidis likely contaminant. -Urine culture with Proteus mirabilis resistant to Cipro, nitrofurantoin and Bactrim. -Treated with azithromycin for 5 days and aztreonam for 7 days with penicillin allergy. Patient finished antibiotic therapy.  Acute metabolic encephalopathy with underlying cognitive decline -CT head unremarkable.  No neurological deficit.   Clinically improved.  Acute on chronic hypoxic and hypercarbic respiratory failure -Uses 3 L of oxygen at home.  Has history of COPD.  Initially placed on BiPAP.  Now declines to use BiPAP at night.  Discontinued.  Keep on oxygen to keep saturations more than 88%.  Acute kidney injury on chronic kidney disease stage IIIb -Baseline creatinine about 1.5.  Improved.  History of PE DVT, chronic A. fib with RVR -Anticoagulated on Xarelto.  Continue Coreg   Hypothyroidism -Continue Synthroid   Hyperlipidemia -Continue Lipitor, zetia   Depression and anxiety -Continue Wellbutrin, Zoloft  Diabetes mellitus type 2 with peripheral neuropathy -Continue Lantus, sliding scale insulin, Neurontin  Advanced physical deconditioning and debility -From assisted living facility.  They're not able to meet her care demands. Waiting for skilled nursing facility availability.    DVT prophylaxis:   rivaroxaban (XARELTO) tablet 20 mg  Code Status: DNR Family Communication: No family at bedside Disposition Plan:   Status is: Inpatient  Remains inpatient appropriate because:Unsafe d/c plan   Dispo:  Patient From: [place]  Planned Disposition: [place]  Expected discharge date: As soon as SNF placement available  Medically stable for discharge: Yes, awaiting SNF placement    Antimicrobials:  Anti-infectives (From admission, onward)   Start     Dose/Rate Route Frequency Ordered Stop   05/01/20 1800  azithromycin (ZITHROMAX) tablet 500 mg        500 mg Oral Daily-1800 05/01/20 0841 05/01/20 1700   04/28/20 0000  aztreonam (AZACTAM) 0.5 g in dextrose 5 % 50 mL IVPB        0.5 g 100 mL/hr over 30 Minutes Intravenous Every 8 hours 04/27/20 1625 05/03/20 1007   04/27/20 1745  aztreonam (  AZACTAM) 2 g in sodium chloride 0.9 % 100 mL IVPB  Status:  Discontinued        2 g 200 mL/hr over 30 Minutes Intravenous  Once 04/27/20 1739 04/27/20 1818   04/27/20 1745   azithromycin (ZITHROMAX) 500 mg in sodium chloride 0.9 % 250 mL IVPB  Status:  Discontinued        500 mg 250 mL/hr over 60 Minutes Intravenous Every 24 hours 04/27/20 1739 05/01/20 0841   04/27/20 1445  vancomycin (VANCOREADY) IVPB 2000 mg/400 mL        2,000 mg 200 mL/hr over 120 Minutes Intravenous  Once 04/27/20 1430 04/27/20 1829   04/27/20 1430  aztreonam (AZACTAM) 2 g in sodium chloride 0.9 % 100 mL IVPB        2 g 200 mL/hr over 30 Minutes Intravenous  Once 04/27/20 1430 04/27/20 1737        Objective: Vitals:   05/05/20 0014 05/05/20 0418 05/05/20 0747 05/05/20 1134  BP: 119/65 (!) 165/84 (!) 148/87 (!) 144/84  Pulse: 68 83 98 (!) 48  Resp: 20 19 (!) 23 17  Temp: 97.6 F (36.4 C) 97.9 F (36.6 C) (!) 97.5 F (36.4 C) 98 F (36.7 C)  TempSrc: Oral Oral Oral Axillary  SpO2: 94% 94% 90% 97%  Weight:      Height:        Intake/Output Summary (Last 24 hours) at 05/05/2020 1156 Last data filed at 05/05/2020 0749 Gross per 24 hour  Intake 480 ml  Output 1050 ml  Net -570 ml   Filed Weights   04/27/20 1600  Weight: 94.8 kg    Examination:  General exam: Appears calm and comfortable  Respiratory system: Clear to auscultation. Respiratory effort normal. No respiratory distress. No conversational dyspnea.  Cardiovascular system: S1 & S2 heard, irregular rhythm, rate 90s. No murmurs. No pedal edema. Gastrointestinal system: Abdomen is nondistended, soft and nontender. Normal bowel sounds heard. Central nervous system: Alert  Extremities: Symmetric in appearance  Skin: No rashes, lesions or ulcers on exposed skin  Psychiatry: Judgement and insight appear normal.  Data Reviewed: I have personally reviewed following labs and imaging studies  CBC: Recent Labs  Lab 04/29/20 0718 05/01/20 0328  WBC 9.7 8.1  HGB 11.3* 11.1*  HCT 37.8 36.3  MCV 95.9 96.0  PLT 156 300   Basic Metabolic Panel: Recent Labs  Lab 04/29/20 0718 04/30/20 0321 05/01/20 0328  05/02/20 0253  NA 139 141 141 141  K 4.5 4.3 4.0 4.4  CL 105 107 107 105  CO2 27 26 25 25   GLUCOSE 88 81 110* 113*  BUN 20 16 15 14   CREATININE 1.29* 1.07* 1.15* 1.08*  CALCIUM 8.0* 8.1* 8.3* 8.6*   GFR: Estimated Creatinine Clearance: 44.8 mL/min (A) (by C-G formula based on SCr of 1.08 mg/dL (H)). Liver Function Tests: No results for input(s): AST, ALT, ALKPHOS, BILITOT, PROT, ALBUMIN in the last 168 hours. No results for input(s): LIPASE, AMYLASE in the last 168 hours. No results for input(s): AMMONIA in the last 168 hours. Coagulation Profile: No results for input(s): INR, PROTIME in the last 168 hours. Cardiac Enzymes: No results for input(s): CKTOTAL, CKMB, CKMBINDEX, TROPONINI in the last 168 hours. BNP (last 3 results) No results for input(s): PROBNP in the last 8760 hours. HbA1C: No results for input(s): HGBA1C in the last 72 hours. CBG: Recent Labs  Lab 05/04/20 0556 05/04/20 1117 05/04/20 1638 05/04/20 2144 05/05/20 0611  GLUCAP 95 113* 108* 120*  136*   Lipid Profile: No results for input(s): CHOL, HDL, LDLCALC, TRIG, CHOLHDL, LDLDIRECT in the last 72 hours. Thyroid Function Tests: No results for input(s): TSH, T4TOTAL, FREET4, T3FREE, THYROIDAB in the last 72 hours. Anemia Panel: No results for input(s): VITAMINB12, FOLATE, FERRITIN, TIBC, IRON, RETICCTPCT in the last 72 hours. Sepsis Labs: No results for input(s): PROCALCITON, LATICACIDVEN in the last 168 hours.  Recent Results (from the past 240 hour(s))  SARS Coronavirus 2 by RT PCR (hospital order, performed in [facility] hospital lab) Nasopharyngeal Nasopharyngeal Swab     Status: None   Collection Time: 04/27/20 12:58 PM   Specimen: Nasopharyngeal Swab  Result Value Ref Range Status   SARS Coronavirus 2 NEGATIVE NEGATIVE Final    Comment: (NOTE) SARS-CoV-2 target nucleic acids are NOT DETECTED.  The SARS-CoV-2 RNA is generally detectable in upper and lower respiratory specimens during the acute  phase of infection. The lowest concentration of SARS-CoV-2 viral copies this assay can detect is 250 copies / mL. A negative result does not preclude SARS-CoV-2 infection and should not be used as the sole basis for treatment or other patient management decisions.  A negative result may occur with improper specimen collection / handling, submission of specimen other than nasopharyngeal swab, presence of viral mutation(s) within the areas targeted by this assay, and inadequate number of viral copies (<250 copies / mL). A negative result must be combined with clinical observations, patient history, and epidemiological information.  Fact Sheet for Patients:   StrictlyIdeas.no  Fact Sheet for Healthcare Providers: BankingDealers.co.za  This test is not yet approved or  cleared by the [place] FDA and has been authorized for detection and/or diagnosis of SARS-CoV-2 by FDA under an Emergency Use Authorization (EUA).  This EUA will remain in effect (meaning this test can be used) for the duration of the COVID-19 declaration under Section 564(b)(1) of the Act, 21 U.S.C. section 360bbb-3(b)(1), unless the authorization is terminated or revoked sooner.  Performed at [facility], [place] [address] [place], [place] [address]   Culture, blood (routine x 2)     Status: None   Collection Time: 04/27/20 12:59 PM   Specimen: BLOOD RIGHT FOREARM  Result Value Ref Range Status   Specimen Description BLOOD RIGHT FOREARM  Final   Special Requests   Final    BOTTLES DRAWN AEROBIC AND ANAEROBIC Blood Culture adequate volume   Culture   Final    NO GROWTH 5 DAYS Performed at [facility], [place] [address], [place], [place] [address]    Report Status 05/02/2020 FINAL  Final  Urine culture     Status: Abnormal   Collection Time: 04/27/20  1:03 PM   Specimen: Urine, Random  Result Value Ref Range Status   Specimen Description URINE, RANDOM   Final   Special Requests   Final    NONE Performed at [facility], [place] [address], [place], [place] [address]    Culture >=100,000 COLONIES/mL PROTEUS MIRABILIS (A)  Final   Report Status 04/30/2020 FINAL  Final   Organism ID, Bacteria PROTEUS MIRABILIS (A)  Final      Susceptibility   Proteus mirabilis - MIC*    AMPICILLIN <=2 SENSITIVE Sensitive     CEFAZOLIN <=4 SENSITIVE Sensitive     CEFTRIAXONE <=0.25 SENSITIVE Sensitive     CIPROFLOXACIN >=4 RESISTANT Resistant     GENTAMICIN <=1 SENSITIVE Sensitive     IMIPENEM 2 SENSITIVE Sensitive     NITROFURANTOIN  128 RESISTANT Resistant     TRIMETH/SULFA >=320 RESISTANT Resistant     AMPICILLIN/SULBACTAM <=2 SENSITIVE Sensitive     PIP/TAZO <=4 SENSITIVE Sensitive     * >=100,000 COLONIES/mL PROTEUS MIRABILIS  Culture, blood (routine x 2)     Status: Abnormal   Collection Time: 04/27/20  2:32 PM   Specimen: BLOOD  Result Value Ref Range Status   Specimen Description BLOOD SITE NOT SPECIFIED  Final   Special Requests   Final    BOTTLES DRAWN AEROBIC AND ANAEROBIC Blood Culture adequate volume   Culture  Setup Time   Final    GRAM POSITIVE COCCI IN CLUSTERS AEROBIC BOTTLE ONLY CRITICAL RESULT CALLED TO, READ BACK BY AND VERIFIED WITH: PHARMD D PIERCE AT 2023 04/28/20 BY L BENFIELD    Culture (A)  Final    STAPHYLOCOCCUS EPIDERMIDIS THE SIGNIFICANCE OF ISOLATING THIS ORGANISM FROM A SINGLE SET OF BLOOD CULTURES WHEN MULTIPLE SETS ARE DRAWN IS UNCERTAIN. PLEASE NOTIFY THE MICROBIOLOGY DEPARTMENT WITHIN ONE WEEK IF SPECIATION AND SENSITIVITIES ARE REQUIRED. Performed at [facility], [place] [address], [place], [place] [address]    Report Status 04/30/2020 FINAL  Final  Blood Culture ID Panel (Reflexed)     Status: Abnormal   Collection Time: 04/27/20  2:32 PM  Result Value Ref Range Status   Enterococcus faecalis NOT DETECTED NOT DETECTED Final   Enterococcus Faecium NOT DETECTED NOT DETECTED Final   Listeria  monocytogenes NOT DETECTED NOT DETECTED Final   Staphylococcus species DETECTED (A) NOT DETECTED Final    Comment: CRITICAL RESULT CALLED TO, READ BACK BY AND VERIFIED WITH: PHARMD D PIERCE AT 2023 04/28/20 BY L BENFIELD    Staphylococcus aureus (BCID) NOT DETECTED NOT DETECTED Final   Staphylococcus epidermidis DETECTED (A) NOT DETECTED Final    Comment: Methicillin (oxacillin) resistant coagulase negative staphylococcus. Possible blood culture contaminant (unless isolated from more than one blood culture draw or clinical case suggests pathogenicity). No antibiotic treatment is indicated for blood  culture contaminants. CRITICAL RESULT CALLED TO, READ BACK BY AND VERIFIED WITH: PHARMD D PIERCE AT 2023 04/28/20 BY L BENFIELD    Staphylococcus lugdunensis NOT DETECTED NOT DETECTED Final   Streptococcus species NOT DETECTED NOT DETECTED Final   Streptococcus agalactiae NOT DETECTED NOT DETECTED Final   Streptococcus pneumoniae NOT DETECTED NOT DETECTED Final   Streptococcus pyogenes NOT DETECTED NOT DETECTED Final   A.calcoaceticus-baumannii NOT DETECTED NOT DETECTED Final   Bacteroides fragilis NOT DETECTED NOT DETECTED Final   Enterobacterales NOT DETECTED NOT DETECTED Final   Enterobacter cloacae complex NOT DETECTED NOT DETECTED Final   Escherichia coli NOT DETECTED NOT DETECTED Final   Klebsiella aerogenes NOT DETECTED NOT DETECTED Final   Klebsiella oxytoca NOT DETECTED NOT DETECTED Final   Klebsiella pneumoniae NOT DETECTED NOT DETECTED Final   Proteus species NOT DETECTED NOT DETECTED Final   Salmonella species NOT DETECTED NOT DETECTED Final   Serratia marcescens NOT DETECTED NOT DETECTED Final   Haemophilus influenzae NOT DETECTED NOT DETECTED Final   Neisseria meningitidis NOT DETECTED NOT DETECTED Final   Pseudomonas aeruginosa NOT DETECTED NOT DETECTED Final   Stenotrophomonas maltophilia NOT DETECTED NOT DETECTED Final   Candida albicans NOT DETECTED NOT DETECTED Final    Candida auris NOT DETECTED NOT DETECTED Final   Candida glabrata NOT DETECTED NOT DETECTED Final   Candida krusei NOT DETECTED NOT DETECTED Final   Candida parapsilosis NOT DETECTED NOT DETECTED Final   Candida tropicalis NOT DETECTED NOT DETECTED Final  Cryptococcus neoformans/gattii NOT DETECTED NOT DETECTED Final   Methicillin resistance mecA/C DETECTED (A) NOT DETECTED Final    Comment: CRITICAL RESULT CALLED TO, READ BACK BY AND VERIFIED WITH: PHARMD D PIERCE AT 2023 04/28/20 BY L BENFIELD Performed at [facility], [place] [address] [place], [place] [address]   MRSA PCR Screening     Status: None   Collection Time: 04/28/20  4:01 AM   Specimen: Nasopharyngeal  Result Value Ref Range Status   MRSA by PCR NEGATIVE NEGATIVE Final    Comment:        The GeneXpert MRSA Assay (FDA approved for NASAL specimens only), is one component of a comprehensive MRSA colonization surveillance program. It is not intended to diagnose MRSA infection nor to guide or monitor treatment for MRSA infections. Performed at [facility], [place] [address] [place], [place] [address]   SARS CORONAVIRUS 2 (TAT 6-24 HRS) Nasopharyngeal Nasopharyngeal Swab     Status: None   Collection Time: 05/03/20  3:00 PM   Specimen: Nasopharyngeal Swab  Result Value Ref Range Status   SARS Coronavirus 2 NEGATIVE NEGATIVE Final    Comment: (NOTE) SARS-CoV-2 target nucleic acids are NOT DETECTED.  The SARS-CoV-2 RNA is generally detectable in upper and lower respiratory specimens during the acute phase of infection. Negative results do not preclude SARS-CoV-2 infection, do not rule out co-infections with other pathogens, and should not be used as the sole basis for treatment or other patient management decisions. Negative results must be combined with clinical observations, patient history, and epidemiological information. The expected result is Negative.  Fact Sheet for  Patients: SugarRoll.be  Fact Sheet for Healthcare Providers: https://www.woods-mathews.com/  This test is not yet approved or cleared by the [place] FDA and  has been authorized for detection and/or diagnosis of SARS-CoV-2 by FDA under an Emergency Use Authorization (EUA). This EUA will remain  in effect (meaning this test can be used) for the duration of the COVID-19 declaration under Se ction 564(b)(1) of the Act, 21 U.S.C. section 360bbb-3(b)(1), unless the authorization is terminated or revoked sooner.  Performed at [facility], [place] [address] [place], [place] [address]       Radiology Studies: No results found.    Scheduled Meds: . atorvastatin  10 mg Oral QHS  . buPROPion  450 mg Oral Daily  . carvedilol  12.5 mg Oral BID WC  . docusate sodium  100 mg Oral Daily  . ezetimibe  10 mg Oral Daily  . gabapentin  300 mg Oral BID WC  . gabapentin  600 mg Oral QHS  . insulin aspart  0-5 Units Subcutaneous QHS  . insulin aspart  0-9 Units Subcutaneous TID WC  . insulin glargine  7 Units Subcutaneous QHS  . levothyroxine  88 mcg Oral QAC breakfast  . rivaroxaban  20 mg Oral QPM  . sertraline  75 mg Oral Daily   Continuous Infusions:   LOS: 8 days      Time spent: 35 minutes   Dessa Phi, DO [facility] 05/05/2020, 11:56 AM   Available via Epic secure chat 7am-7pm After these hours, please refer to coverage provider listed on [email or web address]

## 2020-05-06 LAB — GLUCOSE, CAPILLARY
Glucose-Capillary: 107 mg/dL — ABNORMAL HIGH (ref 70–99)
Glucose-Capillary: 123 mg/dL — ABNORMAL HIGH (ref 70–99)

## 2020-05-06 MED ORDER — INSULIN GLARGINE 100 UNIT/ML ~~LOC~~ SOLN: 7.0000 [IU] | Freq: Every day | SUBCUTANEOUS | 0 refills | Status: DC

## 2020-05-06 MED ORDER — LORAZEPAM 0.5 MG PO TABS
0.5000 mg | ORAL_TABLET | Freq: Three times a day (TID) | ORAL | 0 refills | Status: DC | PRN
Start: 2020-05-06 — End: 2020-07-19

## 2020-05-06 MED ORDER — CARVEDILOL 12.5 MG PO TABS: 12.5000 mg | ORAL_TABLET | Freq: Two times a day (BID) | ORAL | 0 refills | Status: AC

## 2020-05-06 NOTE — TOC Transition Note (Signed)
Transition of Care [facility]) - CM/SW Discharge Note   Patient Details  Name: Dawn Morrow MRN: [id number] Date of Birth: 22-Sep-1935  [facility]) CM/SW Contact:  Vinie Sill, [place] Phone Number: 05/06/2020, 1:24 PM   Clinical Narrative:     Patient will DC to: [facility]  DC Date: 05/06/20 Family Notified:[name],niece Transport XB:[id number]   Per MD patient is ready for discharge. RN, patient, and facility notified of DC. Discharge Summary sent to facility. RN given number for [phone], room 3243. Ambulance transport requested for patient.   Clinical Social Worker signing off.  Thurmond Butts, MSW, [place] Clinical Social Worker   Final next level of care: Skilled Nursing Facility Barriers to Discharge: Barriers Resolved   Patient Goals and CMS Choice Patient states their goals for this hospitalization and ongoing recovery are:: return back to [place] with her friends      Discharge Placement PASRR number recieved: 05/03/20            Patient [facility] bed at: [facility] Patient to be transferred to facility by: [place] Name of family member notified: [place] Patient and family notified of of transfer: 05/06/20  Discharge Plan and Services In-house Referral: Clinical Social Work                                   Social Determinants of Health (SDOH) Interventions     Readmission Risk Interventions Readmission Risk Prevention Plan 10/03/2019  Transportation Screening Complete  PCP or Specialist Appt within 3-5 Days Complete  HRI or [place] Not Complete  HRI or Home Care Consult comments Patient at her baseline  Social Work Consult for [place] Planning/Counseling [place] Not Applicable  Medication Review Press photographer) Referral to Pharmacy  Some recent data might be hidden

## 2020-05-06 NOTE — TOC Progression Note (Signed)
Transition of Care [facility]) - Progression Note    Patient Details  Name: DAENA ALPER MRN: [id number] Date of Birth: 1935/10/10  Transition of Care [facility]) CM/SW [place], [place] Phone Number: 05/06/2020, 10:00 AM  Clinical Narrative:     CSW was informed by [facility] patient's niece, indicating she can not come and complete paperwork at 11:30am or 1:00pm. She states she can not receive admissions paperwork by fax or email.   CSW contacted patient's niece [name], she states she has to work and can not receive email or faxed admissions paperwork at work and reports no internet at home.CSW inquired when can she met to complete the paperwork. She states she after work she has to pick up her grandchildren. She can not get to [facility] until 6:30pm.  CSW asked SNF if she can come at 6:30pm, SNF states "no" She requested I call the patient's nephew Wille Glaser. CSW spoke with Wille Glaser and he advised he is at [place] and is unable to complete paperwork or assist with patient needs " and she(referring to [place]".   CSW updated leadership.  Thurmond Butts, MSW, [place] Clinical Social Worker      Expected Discharge Plan: Assisted Living Barriers to Discharge:  (PT recommends SNF- patient want to return to ALF- waiting to hear back from ALF to determine of they can met her needs)  Expected Discharge Plan and Services Expected Discharge Plan: Assisted Living In-house Referral: Clinical Social Work     Living arrangements for the past 2 months: [place] Expected Discharge Date: 05/06/20                                     Social Determinants of Health (SDOH) Interventions    Readmission Risk Interventions Readmission Risk Prevention Plan 10/03/2019  Transportation Screening Complete  PCP or Specialist Appt within 3-5 Days Complete  HRI or [place] Not Complete  HRI or Home Care Consult comments Patient at her  baseline  Social Work Consult for [place] Planning/Counseling [place] Screening Not Applicable  Medication Review Press photographer) Referral to Pharmacy  Some recent data might be hidden

## 2020-05-06 NOTE — Progress Notes (Signed)
Report attempted to be called to [facility] x2. PTAR to transport pt to SNF. Family aware. IV removed, clean and intact.   Clyde Canterbury, RN

## 2020-05-06 NOTE — Discharge Summary (Signed)
Physician Discharge Summary  Dawn Morrow:[id number] DOB: 02-13-1936 DOA: 04/27/2020  PCP: Colon Branch, MD  Admit date: 04/27/2020 Discharge date: 05/06/2020  Disposition:  SNF   Recommendations for Outpatient Follow-up:  1. Follow up with PCP   Discharge Condition: Stable CODE STATUS: DNR  Diet recommendation:  Diet Orders (From admission, onward)    Start     Ordered   04/30/20 1054  Diet heart healthy/carb modified Room service appropriate? Yes with Assist; Fluid consistency: Thin  Diet effective now       Question Answer Comment  Diet-HS Snack? Nothing   Room service appropriate? Yes with Assist   Fluid consistency: Thin      04/30/20 1053          Brief/Interim Summary: Dawn Morrow is an 84 y.o.femalewith a medical history ofhypertension, hyperlipidemia, hypothyroidism, atrial fibrillation, history of PE and DVT, who presented to the emergency department with confusion. She was recently admitted in July 2021 with similar presentation was found to have acute metabolic encephalopathy secondary to Enterococcus faecalis UTI, and patient was placed on vancomycin for 3 days. Patient was discharged to nursing facility and most recently went to [facility] assisted living facility on 04/23/2020. Her niece was visiting with her this morning and noticed that she was completely altered. Evaluation ED was remarkable for sepsis secondary to UTI and possible pneumonia. Patient was treated with antibiotics to complete therapy.   Discharge Diagnoses:  Active Problems:   Hypothyroidism   COPD (chronic obstructive pulmonary disease) (HCC)   Pulmonary embolism (HCC)   DVT (deep venous thrombosis) (HCC)   Diabetes mellitus type 2 in obese (HCC)   CKD (chronic kidney disease), stage III   Acute kidney failure (HCC)   Acute lower UTI   Atrial fibrillation, chronic (HCC)   Acute metabolic encephalopathy   Severe sepsis secondary to Proteus UTI present on admission -Presented with  confusion, leukocytosis, tachycardia, and acute renal failure. -Chest x-ray with increased retrocardiac markings. -Blood cultures 1 out of 2 staph epidermidis likely contaminant. -Urine culture with Proteus mirabilis resistant to Cipro, nitrofurantoin and Bactrim. -Treated with azithromycin for 5 days and aztreonam for 7 days with penicillin allergy. Patient finished antibiotic therapy. -Sepsis resolved   Acute metabolic encephalopathy with underlying cognitive decline -CT head unremarkable. No neurological deficit. Clinically improved.  Acute on chronic hypoxic and hypercarbic respiratory failure -Uses 3 L of oxygen at home. Has history of COPD. Initially placed on BiPAP. Now declines to use BiPAP at night. Discontinued. Keep on oxygen to keep saturations more than 88%.   Acute kidney injury on chronic kidney disease stage IIIb -Baseline creatinine about 1.5. Improved.  History of PE DVT, chronic A. fib with RVR -Anticoagulated on Xarelto. Continue Coreg   Hypothyroidism -Continue Synthroid   Hyperlipidemia -Continue Lipitor, zetia   Depression and anxiety -Continue Wellbutrin, Zoloft  Diabetes mellitus type 2 with peripheral neuropathy -Continue Lantus, sliding scale insulin, Neurontin  Advanced physical deconditioning and debility -DC to SNF    Discharge Instructions   Allergies as of 05/06/2020      Reactions   Penicillins Anaphylaxis   Sulfonylureas Other (See Comments)   Unknown reaction   Codeine Other (See Comments)   Unknown reaction   Sulfonamide Derivatives Other (See Comments)   Unknown reaction      Medication List    TAKE these medications   acetaminophen 500 MG tablet Commonly known as: TYLENOL Take 500 mg by mouth every 4 (four) hours as needed (pain).  albuterol 108 (90 Base) MCG/ACT inhaler Commonly known as: VENTOLIN HFA Inhale 2 puffs into the lungs every 6 (six) hours as needed for wheezing or shortness of breath.    aspirin-acetaminophen-caffeine 250-250-65 MG tablet Commonly known as: EXCEDRIN MIGRAINE Take 2 tablets by mouth every 6 (six) hours as needed for migraine.   atorvastatin 10 MG tablet Commonly known as: LIPITOR Take 1 tablet (10 mg total) by mouth at bedtime.   buPROPion HCl ER (XL) 450 MG Tb24 Take 450 mg by mouth daily.   carvedilol 12.5 MG tablet Commonly known as: COREG Take 1 tablet (12.5 mg total) by mouth 2 (two) times daily with a meal. What changed:   medication strength  how much to take   docusate sodium 100 MG capsule Commonly known as: COLACE Take 100 mg by mouth daily.   ezetimibe 10 MG tablet Commonly known as: ZETIA Take 10 mg by mouth daily.   gabapentin 300 MG capsule Commonly known as: NEURONTIN Take 1 cap in AM, 1 cap at noon, 2 caps at bedtime What changed:   how much to take  how to take this  when to take this  additional instructions   insulin glargine 100 UNIT/ML injection Commonly known as: LANTUS Inject 0.07 mLs (7 Units total) into the skin daily. What changed: how much to take   ketoconazole 2 % shampoo Commonly known as: NIZORAL Apply 1 application topically See admin instructions. Use topically to shampoo hair twice weekly - Tuesday and Friday   levothyroxine 88 MCG tablet Commonly known as: SYNTHROID Take 88 mcg by mouth daily before breakfast.   linagliptin 5 MG Tabs tablet Commonly known as: Tradjenta Take 1 tablet (5 mg total) by mouth at bedtime.   LORazepam 0.5 MG tablet Commonly known as: ATIVAN Take 1 tablet (0.5 mg total) by mouth every 8 (eight) hours as needed for anxiety.   meclizine 25 MG tablet Commonly known as: ANTIVERT Take 25 mg by mouth at bedtime as needed for dizziness.   NovoLOG FlexPen 100 UNIT/ML FlexPen Generic drug: insulin aspart Inject 0-12 Units into the skin See admin instructions. Inject 0-12 units subcutaneously four times daily - before meals and at bedtime - per sliding scale: CBG  0-150 0 units, 151-200 4 units, 201-250 6 units, 251-300 8 units, 301-350 10 units, 351-400 12 units, >400 12 units   ondansetron 4 MG tablet Commonly known as: ZOFRAN Take 4 mg by mouth every 8 (eight) hours as needed for nausea or vomiting.   OXYGEN Inhale 3 L into the lungs continuous.   polyethylene glycol 17 g packet Commonly known as: MIRALAX / GLYCOLAX Take 17 g by mouth daily as needed for mild constipation. Reported on 10/09/2015   repaglinide 0.5 MG tablet Commonly known as: Prandin Take 1 tablet (0.5 mg total) by mouth 3 (three) times daily before meals. What changed: additional instructions   rivaroxaban 20 MG Tabs tablet Commonly known as: Xarelto Take 1 tablet (20 mg total) by mouth daily with supper. What changed: when to take this   sertraline 50 MG tablet Commonly known as: ZOLOFT Take 75 mg by mouth daily.   Systane Balance 0.6 % Soln Generic drug: Propylene Glycol Place 1 drop into both eyes 4 (four) times daily as needed (for dry eyes).       Allergies  Allergen Reactions  . Penicillins Anaphylaxis  . Sulfonylureas Other (See Comments)    Unknown reaction  . Codeine Other (See Comments)    Unknown reaction  .  Sulfonamide Derivatives Other (See Comments)    Unknown reaction     Procedures/Studies: CT Head Wo Contrast  Result Date: 04/27/2020 CLINICAL DATA:  84 year old female with altered mental status and confusion. EXAM: CT HEAD WITHOUT CONTRAST TECHNIQUE: Contiguous axial images were obtained from the base of the skull through the vertex without intravenous contrast. COMPARISON:  03/26/2020 CT and prior studies FINDINGS: Brain: No evidence of acute infarction, hemorrhage, hydrocephalus, extra-axial collection or mass lesion/mass effect. Atrophy, chronic small-vessel white matter ischemic changes and remote bilateral basal ganglia infarcts are again noted. Vascular: Carotid atherosclerotic calcifications are noted. Skull: Normal. Negative for  fracture or focal lesion. Sinuses/Orbits: No acute finding. Other: None. IMPRESSION: 1. No evidence of acute intracranial abnormality. 2. Atrophy, chronic small-vessel white matter ischemic changes and remote bilateral basal ganglia infarcts. Electronically Signed   By: Margarette Canada M.D.   On: 04/27/2020 14:50   DG Chest Port 1 View  Result Date: 04/27/2020 CLINICAL DATA:  New onset confusion EXAM: PORTABLE CHEST 1 VIEW COMPARISON:  03/31/2019 FINDINGS: Chronic cardiomegaly. Extensive artifact from EKG leads. Low volume chest with interstitial coarsening, chronic. No edema, effusion, or pneumothorax. IMPRESSION: 1. Limited portable chest. 2. Cardiomegaly without failure. 3. Increased retrocardiac markings but improved from 03/30/2020 chest x-ray Electronically Signed   By: Monte Fantasia M.D.   On: 04/27/2020 14:03        Discharge Exam: Vitals:   05/06/20 0451 05/06/20 0821  BP: (!) 123/95 138/82  Pulse: 99 70  Resp: 20 15  Temp: 97.6 F (36.4 C) 97.6 F (36.4 C)  SpO2: 90% 97%    General: Pt is alert, awake, not in acute distress Cardiovascular: RRR, S1/S2 +, no edema Respiratory: CTA bilaterally, no wheezing, no rhonchi, no respiratory distress, no conversational dyspnea, on [place] O2  Abdominal: Soft, NT, ND, bowel sounds + Extremities: no edema, no cyanosis Psych: Stable    The results of significant diagnostics from this hospitalization (including imaging, microbiology, ancillary and laboratory) are listed below for reference.     Microbiology: Recent Results (from the past 240 hour(s))  SARS Coronavirus 2 by RT PCR (hospital order, performed in [facility] hospital lab) Nasopharyngeal Nasopharyngeal Swab     Status: None   Collection Time: 04/27/20 12:58 PM   Specimen: Nasopharyngeal Swab  Result Value Ref Range Status   SARS Coronavirus 2 NEGATIVE NEGATIVE Final    Comment: (NOTE) SARS-CoV-2 target nucleic acids are NOT DETECTED.  The SARS-CoV-2 RNA is generally  detectable in upper and lower respiratory specimens during the acute phase of infection. The lowest concentration of SARS-CoV-2 viral copies this assay can detect is 250 copies / mL. A negative result does not preclude SARS-CoV-2 infection and should not be used as the sole basis for treatment or other patient management decisions.  A negative result may occur with improper specimen collection / handling, submission of specimen other than nasopharyngeal swab, presence of viral mutation(s) within the areas targeted by this assay, and inadequate number of viral copies (<250 copies / mL). A negative result must be combined with clinical observations, patient history, and epidemiological information.  Fact Sheet for Patients:   StrictlyIdeas.no  Fact Sheet for Healthcare Providers: BankingDealers.co.za  This test is not yet approved or  cleared by the [place] FDA and has been authorized for detection and/or diagnosis of SARS-CoV-2 by FDA under an Emergency Use Authorization (EUA).  This EUA will remain in effect (meaning this test can be used) for the duration of the COVID-19 declaration  under Section 564(b)(1) of the Act, 21 U.S.C. section 360bbb-3(b)(1), unless the authorization is terminated or revoked sooner.  Performed at [facility], [place] [address] [place], [place] [address]   Culture, blood (routine x 2)     Status: None   Collection Time: 04/27/20 12:59 PM   Specimen: BLOOD RIGHT FOREARM  Result Value Ref Range Status   Specimen Description BLOOD RIGHT FOREARM  Final   Special Requests   Final    BOTTLES DRAWN AEROBIC AND ANAEROBIC Blood Culture adequate volume   Culture   Final    NO GROWTH 5 DAYS Performed at [facility], [place] [address], [place], [place] [address]    Report Status 05/02/2020 FINAL  Final  Urine culture     Status: Abnormal   Collection Time: 04/27/20  1:03 PM   Specimen: Urine, Random   Result Value Ref Range Status   Specimen Description URINE, RANDOM  Final   Special Requests   Final    NONE Performed at [facility], [place] [address], [place], [place] [address]    Culture >=100,000 COLONIES/mL PROTEUS MIRABILIS (A)  Final   Report Status 04/30/2020 FINAL  Final   Organism ID, Bacteria PROTEUS MIRABILIS (A)  Final      Susceptibility   Proteus mirabilis - MIC*    AMPICILLIN <=2 SENSITIVE Sensitive     CEFAZOLIN <=4 SENSITIVE Sensitive     CEFTRIAXONE <=0.25 SENSITIVE Sensitive     CIPROFLOXACIN >=4 RESISTANT Resistant     GENTAMICIN <=1 SENSITIVE Sensitive     IMIPENEM 2 SENSITIVE Sensitive     NITROFURANTOIN 128 RESISTANT Resistant     TRIMETH/SULFA >=320 RESISTANT Resistant     AMPICILLIN/SULBACTAM <=2 SENSITIVE Sensitive     PIP/TAZO <=4 SENSITIVE Sensitive     * >=100,000 COLONIES/mL PROTEUS MIRABILIS  Culture, blood (routine x 2)     Status: Abnormal   Collection Time: 04/27/20  2:32 PM   Specimen: BLOOD  Result Value Ref Range Status   Specimen Description BLOOD SITE NOT SPECIFIED  Final   Special Requests   Final    BOTTLES DRAWN AEROBIC AND ANAEROBIC Blood Culture adequate volume   Culture  Setup Time   Final    GRAM POSITIVE COCCI IN CLUSTERS AEROBIC BOTTLE ONLY CRITICAL RESULT CALLED TO, READ BACK BY AND VERIFIED WITH: PHARMD D PIERCE AT 2023 04/28/20 BY L BENFIELD    Culture (A)  Final    STAPHYLOCOCCUS EPIDERMIDIS THE SIGNIFICANCE OF ISOLATING THIS ORGANISM FROM A SINGLE SET OF BLOOD CULTURES WHEN MULTIPLE SETS ARE DRAWN IS UNCERTAIN. PLEASE NOTIFY THE MICROBIOLOGY DEPARTMENT WITHIN ONE WEEK IF SPECIATION AND SENSITIVITIES ARE REQUIRED. Performed at [facility], [place] [address], [place], [place] [address]    Report Status 04/30/2020 FINAL  Final  Blood Culture ID Panel (Reflexed)     Status: Abnormal   Collection Time: 04/27/20  2:32 PM  Result Value Ref Range Status   Enterococcus faecalis NOT DETECTED NOT DETECTED Final    Enterococcus Faecium NOT DETECTED NOT DETECTED Final   Listeria monocytogenes NOT DETECTED NOT DETECTED Final   Staphylococcus species DETECTED (A) NOT DETECTED Final    Comment: CRITICAL RESULT CALLED TO, READ BACK BY AND VERIFIED WITH: PHARMD D PIERCE AT 2023 04/28/20 BY L BENFIELD    Staphylococcus aureus (BCID) NOT DETECTED NOT DETECTED Final   Staphylococcus epidermidis DETECTED (A) NOT DETECTED Final    Comment: Methicillin (oxacillin) resistant coagulase negative staphylococcus. Possible  blood culture contaminant (unless isolated from more than one blood culture draw or clinical case suggests pathogenicity). No antibiotic treatment is indicated for blood  culture contaminants. CRITICAL RESULT CALLED TO, READ BACK BY AND VERIFIED WITH: PHARMD D PIERCE AT 2023 04/28/20 BY L BENFIELD    Staphylococcus lugdunensis NOT DETECTED NOT DETECTED Final   Streptococcus species NOT DETECTED NOT DETECTED Final   Streptococcus agalactiae NOT DETECTED NOT DETECTED Final   Streptococcus pneumoniae NOT DETECTED NOT DETECTED Final   Streptococcus pyogenes NOT DETECTED NOT DETECTED Final   A.calcoaceticus-baumannii NOT DETECTED NOT DETECTED Final   Bacteroides fragilis NOT DETECTED NOT DETECTED Final   Enterobacterales NOT DETECTED NOT DETECTED Final   Enterobacter cloacae complex NOT DETECTED NOT DETECTED Final   Escherichia coli NOT DETECTED NOT DETECTED Final   Klebsiella aerogenes NOT DETECTED NOT DETECTED Final   Klebsiella oxytoca NOT DETECTED NOT DETECTED Final   Klebsiella pneumoniae NOT DETECTED NOT DETECTED Final   Proteus species NOT DETECTED NOT DETECTED Final   Salmonella species NOT DETECTED NOT DETECTED Final   Serratia marcescens NOT DETECTED NOT DETECTED Final   Haemophilus influenzae NOT DETECTED NOT DETECTED Final   Neisseria meningitidis NOT DETECTED NOT DETECTED Final   Pseudomonas aeruginosa NOT DETECTED NOT DETECTED Final   Stenotrophomonas maltophilia NOT DETECTED NOT  DETECTED Final   Candida albicans NOT DETECTED NOT DETECTED Final   Candida auris NOT DETECTED NOT DETECTED Final   Candida glabrata NOT DETECTED NOT DETECTED Final   Candida krusei NOT DETECTED NOT DETECTED Final   Candida parapsilosis NOT DETECTED NOT DETECTED Final   Candida tropicalis NOT DETECTED NOT DETECTED Final   Cryptococcus neoformans/gattii NOT DETECTED NOT DETECTED Final   Methicillin resistance mecA/C DETECTED (A) NOT DETECTED Final    Comment: CRITICAL RESULT CALLED TO, READ BACK BY AND VERIFIED WITH: PHARMD D PIERCE AT 2023 04/28/20 BY L BENFIELD Performed at [facility], [address] [place], [place] [address]   MRSA PCR Screening     Status: None   Collection Time: 04/28/20  4:01 AM   Specimen: Nasopharyngeal  Result Value Ref Range Status   MRSA by PCR NEGATIVE NEGATIVE Final    Comment:        The GeneXpert MRSA Assay (FDA approved for NASAL specimens only), is one component of a comprehensive MRSA colonization surveillance program. It is not intended to diagnose MRSA infection nor to guide or monitor treatment for MRSA infections. Performed at [facility], [place] [address], [place], [place] [address]   SARS CORONAVIRUS 2 (TAT 6-24 HRS) Nasopharyngeal Nasopharyngeal Swab     Status: None   Collection Time: 05/03/20  3:00 PM   Specimen: Nasopharyngeal Swab  Result Value Ref Range Status   SARS Coronavirus 2 NEGATIVE NEGATIVE Final    Comment: (NOTE) SARS-CoV-2 target nucleic acids are NOT DETECTED.  The SARS-CoV-2 RNA is generally detectable in upper and lower respiratory specimens during the acute phase of infection. Negative results do not preclude SARS-CoV-2 infection, do not rule out co-infections with other pathogens, and should not be used as the sole basis for treatment or other patient management decisions. Negative results must be combined with clinical observations, patient history, and epidemiological information. The  expected result is Negative.  Fact Sheet for Patients: SugarRoll.be  Fact Sheet for Healthcare Providers: https://www.woods-mathews.com/  This test is not yet approved or cleared by the [place] FDA and  has been authorized for detection and/or diagnosis of SARS-CoV-2 by FDA under  an Emergency Use Authorization (EUA). This EUA will remain  in effect (meaning this test can be used) for the duration of the COVID-19 declaration under Se ction 564(b)(1) of the Act, 21 U.S.C. section 360bbb-3(b)(1), unless the authorization is terminated or revoked sooner.  Performed at [facility], [place] [address] [place], [place] [address]      Labs: BNP (last 3 results) No results for input(s): BNP in the last 8760 hours. Basic Metabolic Panel: Recent Labs  Lab 04/30/20 0321 05/01/20 0328 05/02/20 0253  NA 141 141 141  K 4.3 4.0 4.4  CL 107 107 105  CO2 26 25 25   GLUCOSE 81 110* 113*  BUN 16 15 14   CREATININE 1.07* 1.15* 1.08*  CALCIUM 8.1* 8.3* 8.6*   Liver Function Tests: No results for input(s): AST, ALT, ALKPHOS, BILITOT, PROT, ALBUMIN in the last 168 hours. No results for input(s): LIPASE, AMYLASE in the last 168 hours. No results for input(s): AMMONIA in the last 168 hours. CBC: Recent Labs  Lab 05/01/20 0328  WBC 8.1  HGB 11.1*  HCT 36.3  MCV 96.0  PLT 171   Cardiac Enzymes: No results for input(s): CKTOTAL, CKMB, CKMBINDEX, TROPONINI in the last 168 hours. BNP: Invalid input(s): POCBNP CBG: Recent Labs  Lab 05/05/20 0611 05/05/20 1135 05/05/20 1616 05/05/20 2130 05/06/20 0603  GLUCAP 136* 112* 118* 115* 107*   D-Dimer No results for input(s): DDIMER in the last 72 hours. Hgb A1c No results for input(s): HGBA1C in the last 72 hours. Lipid Profile No results for input(s): CHOL, HDL, LDLCALC, TRIG, CHOLHDL, LDLDIRECT in the last 72 hours. Thyroid function studies No results for input(s): TSH, T4TOTAL,  T3FREE, THYROIDAB in the last 72 hours.  Invalid input(s): FREET3 Anemia work up No results for input(s): VITAMINB12, FOLATE, FERRITIN, TIBC, IRON, RETICCTPCT in the last 72 hours. Urinalysis    Component Value Date/Time   COLORURINE AMBER (A) 04/27/2020 1303   APPEARANCEUR CLOUDY (A) 04/27/2020 1303   LABSPEC 1.018 04/27/2020 1303   PHURINE 8.0 04/27/2020 1303   GLUCOSEU NEGATIVE 04/27/2020 1303   GLUCOSEU NEGATIVE 06/12/2014 1410   HGBUR NEGATIVE 04/27/2020 1303   BILIRUBINUR NEGATIVE 04/27/2020 1303   BILIRUBINUR Neg 06/19/2016 1101   KETONESUR NEGATIVE 04/27/2020 1303   PROTEINUR 100 (A) 04/27/2020 1303   UROBILINOGEN >=8.0 06/19/2016 1101   UROBILINOGEN 0.2 05/31/2015 1835   NITRITE NEGATIVE 04/27/2020 1303   LEUKOCYTESUR LARGE (A) 04/27/2020 1303   Sepsis Labs Invalid input(s): PROCALCITONIN,  WBC,  LACTICIDVEN Microbiology Recent Results (from the past 240 hour(s))  SARS Coronavirus 2 by RT PCR (hospital order, performed in [facility] hospital lab) Nasopharyngeal Nasopharyngeal Swab     Status: None   Collection Time: 04/27/20 12:58 PM   Specimen: Nasopharyngeal Swab  Result Value Ref Range Status   SARS Coronavirus 2 NEGATIVE NEGATIVE Final    Comment: (NOTE) SARS-CoV-2 target nucleic acids are NOT DETECTED.  The SARS-CoV-2 RNA is generally detectable in upper and lower respiratory specimens during the acute phase of infection. The lowest concentration of SARS-CoV-2 viral copies this assay can detect is 250 copies / mL. A negative result does not preclude SARS-CoV-2 infection and should not be used as the sole basis for treatment or other patient management decisions.  A negative result may occur with improper specimen collection / handling, submission of specimen other than nasopharyngeal swab, presence of viral mutation(s) within the areas targeted by this assay, and inadequate number of viral copies (<250 copies / mL). A  negative result must be combined with  clinical observations, patient history, and epidemiological information.  Fact Sheet for Patients:   StrictlyIdeas.no  Fact Sheet for Healthcare Providers: BankingDealers.co.za  This test is not yet approved or  cleared by the [place] FDA and has been authorized for detection and/or diagnosis of SARS-CoV-2 by FDA under an Emergency Use Authorization (EUA).  This EUA will remain in effect (meaning this test can be used) for the duration of the COVID-19 declaration under Section 564(b)(1) of the Act, 21 U.S.C. section 360bbb-3(b)(1), unless the authorization is terminated or revoked sooner.  Performed at [facility], [place] [address], [place], [place] [address]   Culture, blood (routine x 2)     Status: None   Collection Time: 04/27/20 12:59 PM   Specimen: BLOOD RIGHT FOREARM  Result Value Ref Range Status   Specimen Description BLOOD RIGHT FOREARM  Final   Special Requests   Final    BOTTLES DRAWN AEROBIC AND ANAEROBIC Blood Culture adequate volume   Culture   Final    NO GROWTH 5 DAYS Performed at [facility], [place] [address] [place], [place] [address]    Report Status 05/02/2020 FINAL  Final  Urine culture     Status: Abnormal   Collection Time: 04/27/20  1:03 PM   Specimen: Urine, Random  Result Value Ref Range Status   Specimen Description URINE, RANDOM  Final   Special Requests   Final    NONE Performed at [facility], [place] [address], [place], [place] [address]    Culture >=100,000 COLONIES/mL PROTEUS MIRABILIS (A)  Final   Report Status 04/30/2020 FINAL  Final   Organism ID, Bacteria PROTEUS MIRABILIS (A)  Final      Susceptibility   Proteus mirabilis - MIC*    AMPICILLIN <=2 SENSITIVE Sensitive     CEFAZOLIN <=4 SENSITIVE Sensitive     CEFTRIAXONE <=0.25 SENSITIVE Sensitive     CIPROFLOXACIN >=4 RESISTANT Resistant     GENTAMICIN <=1 SENSITIVE Sensitive     IMIPENEM 2 SENSITIVE Sensitive      NITROFURANTOIN 128 RESISTANT Resistant     TRIMETH/SULFA >=320 RESISTANT Resistant     AMPICILLIN/SULBACTAM <=2 SENSITIVE Sensitive     PIP/TAZO <=4 SENSITIVE Sensitive     * >=100,000 COLONIES/mL PROTEUS MIRABILIS  Culture, blood (routine x 2)     Status: Abnormal   Collection Time: 04/27/20  2:32 PM   Specimen: BLOOD  Result Value Ref Range Status   Specimen Description BLOOD SITE NOT SPECIFIED  Final   Special Requests   Final    BOTTLES DRAWN AEROBIC AND ANAEROBIC Blood Culture adequate volume   Culture  Setup Time   Final    GRAM POSITIVE COCCI IN CLUSTERS AEROBIC BOTTLE ONLY CRITICAL RESULT CALLED TO, READ BACK BY AND VERIFIED WITH: PHARMD D PIERCE AT 2023 04/28/20 BY L BENFIELD    Culture (A)  Final    STAPHYLOCOCCUS EPIDERMIDIS THE SIGNIFICANCE OF ISOLATING THIS ORGANISM FROM A SINGLE SET OF BLOOD CULTURES WHEN MULTIPLE SETS ARE DRAWN IS UNCERTAIN. PLEASE NOTIFY THE MICROBIOLOGY DEPARTMENT WITHIN ONE WEEK IF SPECIATION AND SENSITIVITIES ARE REQUIRED. Performed at [facility], [place] [address], [place], [place] [address]    Report Status 04/30/2020 FINAL  Final  Blood Culture ID Panel (Reflexed)     Status: Abnormal   Collection Time: 04/27/20  2:32 PM  Result Value Ref Range Status   Enterococcus faecalis NOT DETECTED NOT DETECTED Final  Enterococcus Faecium NOT DETECTED NOT DETECTED Final   Listeria monocytogenes NOT DETECTED NOT DETECTED Final   Staphylococcus species DETECTED (A) NOT DETECTED Final    Comment: CRITICAL RESULT CALLED TO, READ BACK BY AND VERIFIED WITH: PHARMD D PIERCE AT 2023 04/28/20 BY L BENFIELD    Staphylococcus aureus (BCID) NOT DETECTED NOT DETECTED Final   Staphylococcus epidermidis DETECTED (A) NOT DETECTED Final    Comment: Methicillin (oxacillin) resistant coagulase negative staphylococcus. Possible blood culture contaminant (unless isolated from more than one blood culture draw or clinical case suggests pathogenicity). No antibiotic  treatment is indicated for blood  culture contaminants. CRITICAL RESULT CALLED TO, READ BACK BY AND VERIFIED WITH: PHARMD D PIERCE AT 2023 04/28/20 BY L BENFIELD    Staphylococcus lugdunensis NOT DETECTED NOT DETECTED Final   Streptococcus species NOT DETECTED NOT DETECTED Final   Streptococcus agalactiae NOT DETECTED NOT DETECTED Final   Streptococcus pneumoniae NOT DETECTED NOT DETECTED Final   Streptococcus pyogenes NOT DETECTED NOT DETECTED Final   A.calcoaceticus-baumannii NOT DETECTED NOT DETECTED Final   Bacteroides fragilis NOT DETECTED NOT DETECTED Final   Enterobacterales NOT DETECTED NOT DETECTED Final   Enterobacter cloacae complex NOT DETECTED NOT DETECTED Final   Escherichia coli NOT DETECTED NOT DETECTED Final   Klebsiella aerogenes NOT DETECTED NOT DETECTED Final   Klebsiella oxytoca NOT DETECTED NOT DETECTED Final   Klebsiella pneumoniae NOT DETECTED NOT DETECTED Final   Proteus species NOT DETECTED NOT DETECTED Final   Salmonella species NOT DETECTED NOT DETECTED Final   Serratia marcescens NOT DETECTED NOT DETECTED Final   Haemophilus influenzae NOT DETECTED NOT DETECTED Final   Neisseria meningitidis NOT DETECTED NOT DETECTED Final   Pseudomonas aeruginosa NOT DETECTED NOT DETECTED Final   Stenotrophomonas maltophilia NOT DETECTED NOT DETECTED Final   Candida albicans NOT DETECTED NOT DETECTED Final   Candida auris NOT DETECTED NOT DETECTED Final   Candida glabrata NOT DETECTED NOT DETECTED Final   Candida krusei NOT DETECTED NOT DETECTED Final   Candida parapsilosis NOT DETECTED NOT DETECTED Final   Candida tropicalis NOT DETECTED NOT DETECTED Final   Cryptococcus neoformans/gattii NOT DETECTED NOT DETECTED Final   Methicillin resistance mecA/C DETECTED (A) NOT DETECTED Final    Comment: CRITICAL RESULT CALLED TO, READ BACK BY AND VERIFIED WITH: PHARMD D PIERCE AT 2023 04/28/20 BY L BENFIELD Performed at [facility], [address], [place], [place]  [address]   MRSA PCR Screening     Status: None   Collection Time: 04/28/20  4:01 AM   Specimen: Nasopharyngeal  Result Value Ref Range Status   MRSA by PCR NEGATIVE NEGATIVE Final    Comment:        The GeneXpert MRSA Assay (FDA approved for NASAL specimens only), is one component of a comprehensive MRSA colonization surveillance program. It is not intended to diagnose MRSA infection nor to guide or monitor treatment for MRSA infections. Performed at [facility], [place] [address], [place], [place] [address]   SARS CORONAVIRUS 2 (TAT 6-24 HRS) Nasopharyngeal Nasopharyngeal Swab     Status: None   Collection Time: 05/03/20  3:00 PM   Specimen: Nasopharyngeal Swab  Result Value Ref Range Status   SARS Coronavirus 2 NEGATIVE NEGATIVE Final    Comment: (NOTE) SARS-CoV-2 target nucleic acids are NOT DETECTED.  The SARS-CoV-2 RNA is generally detectable in upper and lower respiratory specimens during the acute phase of infection. Negative results do not preclude SARS-CoV-2 infection, do not rule out  co-infections with other pathogens, and should not be used as the sole basis for treatment or other patient management decisions. Negative results must be combined with clinical observations, patient history, and epidemiological information. The expected result is Negative.  Fact Sheet for Patients: SugarRoll.be  Fact Sheet for Healthcare Providers: https://www.woods-mathews.com/  This test is not yet approved or cleared by the [place] FDA and  has been authorized for detection and/or diagnosis of SARS-CoV-2 by FDA under an Emergency Use Authorization (EUA). This EUA will remain  in effect (meaning this test can be used) for the duration of the COVID-19 declaration under Se ction 564(b)(1) of the Act, 21 U.S.C. section 360bbb-3(b)(1), unless the authorization is terminated or revoked sooner.  Performed at [facility],  [place] [address] [place], [place] [address]      Patient was seen and examined on the day of discharge and was found to be in stable condition. Time coordinating discharge: 35 minutes including assessment and coordination of care, as well as examination of the patient.   SIGNED:  Dessa Phi, DO [facility] 05/06/2020, 8:41 AM

## 2020-06-19 ENCOUNTER — Emergency Department (HOSPITAL_COMMUNITY): Payer: Medicare Other

## 2020-06-19 ENCOUNTER — Emergency Department (HOSPITAL_COMMUNITY)
Admission: EM | Admit: 2020-06-19 | Discharge: 2020-06-19 | Disposition: A | Discharge status: Home / Self Care | Payer: Medicare Other | Attending: Emergency Medicine | Admitting: Emergency Medicine

## 2020-06-19 ENCOUNTER — Encounter (HOSPITAL_COMMUNITY): Payer: Self-pay | Admitting: Emergency Medicine

## 2020-06-19 DIAGNOSIS — Y92002 Bathroom of unspecified non-institutional (private) residence single-family (private) house as the place of occurrence of the external cause: Secondary | ICD-10-CM | POA: Diagnosis not present

## 2020-06-19 DIAGNOSIS — Y9301 Activity, walking, marching and hiking: Secondary | ICD-10-CM | POA: Diagnosis not present

## 2020-06-19 DIAGNOSIS — Z7901 Long term (current) use of anticoagulants: Secondary | ICD-10-CM | POA: Insufficient documentation

## 2020-06-19 DIAGNOSIS — S0001XA Abrasion of scalp, initial encounter: Secondary | ICD-10-CM | POA: Diagnosis not present

## 2020-06-19 DIAGNOSIS — S0990XA Unspecified injury of head, initial encounter: Secondary | ICD-10-CM | POA: Diagnosis present

## 2020-06-19 DIAGNOSIS — Z794 Long term (current) use of insulin: Secondary | ICD-10-CM | POA: Insufficient documentation

## 2020-06-19 DIAGNOSIS — W228XXA Striking against or struck by other objects, initial encounter: Secondary | ICD-10-CM | POA: Insufficient documentation

## 2020-06-19 DIAGNOSIS — M25562 Pain in left knee: Secondary | ICD-10-CM

## 2020-06-19 DIAGNOSIS — W19XXXA Unspecified fall, initial encounter: Secondary | ICD-10-CM

## 2020-06-19 LAB — CBG MONITORING, ED: Glucose-Capillary: 104 mg/dL — ABNORMAL HIGH (ref 70–99)

## 2020-06-19 MED ORDER — ACETAMINOPHEN 500 MG PO TABS
1000.0000 mg | ORAL_TABLET | Freq: Once | ORAL | Status: AC
  Administered 2020-06-19: 1000 mg via ORAL
  Filled 2020-06-19: qty 2

## 2020-06-19 NOTE — ED Notes (Signed)
Off floor to CT 

## 2020-06-19 NOTE — Progress Notes (Signed)
Orthopedic Tech Progress Note Patient Details:  Dawn Morrow 06-21-1936 [id number] Level 2 trauma Patient ID: Lyda Jester, female   DOB: 18-Jan-1936, 84 y.o.   MRN: [id number]   Janit Pagan 06/19/2020, 12:23 PM

## 2020-06-19 NOTE — ED Notes (Signed)
Having trouble with primary leads, can not get EKG, getting new leads

## 2020-06-19 NOTE — ED Triage Notes (Signed)
Pt arrived vias [facility] from [place]. Staff found Pt on floor at 1105. LKW approximately 0800. Pt stood up to go to bathroom and fell and hit head on floor.. Don't remember fall. Pt on thinners. A&O x 4 4L  [place] is baseline.PTAr vitals 130/80 68Hr 97% onn 4 L, CBG 118

## 2020-06-19 NOTE — ED Provider Notes (Signed)
[facility] EMERGENCY DEPARTMENT Provider Note   CSN: [id number] Arrival date & time: 06/19/20  1211     History Chief Complaint  Patient presents with  . Fall    Dawn Morrow is a 84 y.o. female.  84 yo F with a chief complaints of a fall.  Patient woke up on the floor.  Complaining of pain to the head of the left knee.  Denies other areas of injury.  Denies chest pain back pain abdominal pain headaches neck pain.  Has otherwise been doing well.  The history is provided by the patient.  Fall This is a new problem. The current episode started 1 to 2 hours ago. The problem occurs rarely. The problem has been resolved. Pertinent negatives include no chest pain, no headaches and no shortness of breath. Nothing aggravates the symptoms. Nothing relieves the symptoms. She has tried nothing for the symptoms. The treatment provided no relief.       History reviewed. No pertinent past medical history.  There are no problems to display for this patient.   History reviewed. No pertinent surgical history.   OB History   No obstetric history on file.     No family history on file.  Social History   Tobacco Use  . Smoking status: Not on file  Substance Use Topics  . Alcohol use: Not on file  . Drug use: Not on file    Home Medications Prior to Admission medications   Medication Sig Start Date End Date Taking? Authorizing Provider  acetaminophen (TYLENOL) 500 MG tablet Take 500 mg by mouth every 4 (four) hours as needed for mild pain or moderate pain.   Yes [provider]  albuterol (VENTOLIN HFA) 108 (90 Base) MCG/ACT inhaler Inhale 2 puffs into the lungs every 6 (six) hours as needed for shortness of breath or wheezing. 04/23/20  Yes [provider]  atorvastatin (LIPITOR) 10 MG tablet Take 10 mg by mouth at bedtime. 05/31/20  Yes [provider]  buPROPion HCl ER, XL, 450 MG TB24 Take 1 tablet by mouth in the morning.  05/31/20  Yes [provider]  carvedilol (COREG) 12.5 MG tablet Take 12.5 mg by mouth 2 (two) times daily. 06/12/20  Yes [provider]  docusate sodium (COLACE) 100 MG capsule Take 100 mg by mouth daily.   Yes [provider]  ezetimibe (ZETIA) 10 MG tablet Take 10 mg by mouth daily. 05/31/20  Yes [provider]  gabapentin (NEURONTIN) 300 MG capsule Take 300-600 mg by mouth See admin instructions. Take 1 capsule (300mg ) by mouth twice daily in the morning and at noon, and take 2 capsules (600mg ) by mouth at bedtime 06/07/20  Yes [provider]  Insulin Glargine (BASAGLAR KWIKPEN) 100 UNIT/ML Inject 7 Units into the skin at bedtime. 06/15/20  Yes [provider]  ketoconazole (NIZORAL) 2 % shampoo Apply 1 application topically 2 (two) times a week. On Tuesdays and Fridays 04/26/20  Yes [provider]  levothyroxine (SYNTHROID) 88 MCG tablet Take 88 mcg by mouth in the morning. 06/01/20  Yes [provider]  LORazepam (ATIVAN) 0.5 MG tablet Take 0.5 mg by mouth every 8 (eight) hours as needed for anxiety. 04/26/20  Yes [provider]  meclizine (ANTIVERT) 25 MG tablet Take 25 mg by mouth at bedtime as needed for dizziness. 04/26/20  Yes [provider]  NOVOLOG FLEXPEN 100 UNIT/ML FlexPen Inject 0-12 Units into the skin 4 (four) times  daily -  before meals and at bedtime. Per sliding scale: BG 0-150 = 0 units BG 151-200 = 4 units BG 201-250 = 6 units BG 251-300 = 8 units BG 301-400 = 12 units BG 401-999 = 12 units 04/23/20  Yes [provider]  nystatin (MYCOSTATIN/NYSTOP) powder Apply 1 application topically 2 (two) times daily. Apply to groin for redness   Yes [provider]  ondansetron (ZOFRAN) 4 MG tablet Take 4 mg by mouth every 8 (eight) hours as needed for nausea or vomiting.   Yes [provider]  OXYGEN Inhale 4 L/min into the lungs continuous.   Yes [provider]    polyethylene glycol (MIRALAX / GLYCOLAX) 17 g packet Take 17 g by mouth daily as needed for mild constipation or moderate constipation.   Yes [provider]  Propylene Glycol 0.6 % SOLN Place 1 drop into both eyes 4 (four) times daily as needed (dry eyes).   Yes [provider]  repaglinide (PRANDIN) 0.5 MG tablet Take 0.5 mg by mouth 3 (three) times daily before meals. 06/07/20  Yes [provider]  sertraline (ZOLOFT) 50 MG tablet Take 75 mg by mouth daily. 05/31/20  Yes [provider]  TRADJENTA 5 MG TABS tablet Take 5 mg by mouth at bedtime. 06/01/20  Yes [provider]  XARELTO 20 MG TABS tablet Take 20 mg by mouth every evening. 06/01/20  Yes [provider]    Allergies    Codeine, Penicillins, and Sulfonylureas  Review of Systems   Review of Systems  Constitutional: Negative for chills and fever.  HENT: Negative for congestion and rhinorrhea.   Eyes: Negative for redness and visual disturbance.  Respiratory: Negative for shortness of breath and wheezing.   Cardiovascular: Negative for chest pain and palpitations.  Gastrointestinal: Negative for nausea and vomiting.  Genitourinary: Negative for dysuria and urgency.  Musculoskeletal: Positive for arthralgias. Negative for myalgias.  Skin: Positive for wound. Negative for pallor.  Neurological: Negative for dizziness and headaches.    Physical Exam Updated Vital Signs BP (!) 149/74 (BP Location: Left Arm)   Pulse 87   Temp 97.8 F (36.6 C) (Oral)   Resp 16   Ht 5\' 6"  (1.676 m)   Wt 88.9 kg   SpO2 100%   BMI 31.64 kg/m   Physical Exam Vitals and nursing note reviewed.  Constitutional:      General: She is not in acute distress.    Appearance: She is well-developed. She is obese. She is not diaphoretic.  HENT:     Head: Normocephalic.     Comments: Abrasion to the vertex of the head.  No midline spinal tenderness able to rotate her head 45 degrees in either  direction without pain. Eyes:     Pupils: Pupils are equal, round, and reactive to light.  Cardiovascular:     Rate and Rhythm: Normal rate and regular rhythm.     Heart sounds: No murmur heard.  No friction rub. No gallop.   Pulmonary:     Effort: Pulmonary effort is normal.     Breath sounds: No wheezing or rales.  Abdominal:     General: There is no distension.     Palpations: Abdomen is soft.     Tenderness: There is no abdominal tenderness.  Musculoskeletal:        General: No tenderness.     Cervical back: Normal range of motion and neck supple.     Comments: Small bruise to  the lateral aspect of the right knee.  Range of motion the knee without significant tenderness.  No appreciable ligamentous laxity.  No pain with internal and external rotation of the leg.  Pulse motor and sensation are intact distally.  Skin:    General: Skin is warm and dry.  Neurological:     Mental Status: She is alert and oriented to person, place, and time.  Psychiatric:        Behavior: Behavior normal.     ED Results / Procedures / Treatments   Labs (all labs ordered are listed, but only abnormal results are displayed) Labs Reviewed  CBG MONITORING, ED - Abnormal; Notable for the following components:      Result Value   Glucose-Capillary 104 (*)    All other components within normal limits    EKG EKG Interpretation  Date/Time:  Wednesday June 19 2020 13:15:34 EDT Ventricular Rate:  89 PR Interval:    QRS Duration: 87 QT Interval:  450 QTC Calculation: 513 R Axis:     Text Interpretation: Wandering atrial pacemaker Low voltage, extremity and precordial leads Prolonged QT interval No old tracing to compare Confirmed by Deno Etienne [phone]) on 06/19/2020 1:33:32 PM   Radiology CT Head Wo Contrast  Result Date: 06/19/2020 CLINICAL DATA:  Head trauma, minor. EXAM: CT HEAD WITHOUT CONTRAST TECHNIQUE: Contiguous axial images were obtained from the base of the skull through the vertex  without intravenous contrast. COMPARISON:  Head CT 04/27/2020. FINDINGS: Brain: Mild generalized cerebral atrophy. Moderate ill-defined hypoattenuation within the cerebral white matter is nonspecific, but consistent with chronic small vessel ischemic disease. As before, there are multiple chronic lacunar infarcts within the bilateral basal ganglia. There is no acute intracranial hemorrhage. No demarcated cortical infarct. No extra-axial fluid collection. No evidence of intracranial mass. No midline shift. Vascular: No hyperdense vessel.  Atherosclerotic calcifications. Skull: Normal. Negative for fracture or focal lesion. Sinuses/Orbits: Visualized orbits show no acute finding. No significant paranasal sinus disease or mastoid effusion at the imaged levels. Other: Left parietal scalp hematoma. IMPRESSION: No evidence of acute intracranial abnormality. Left parietal scalp hematoma. Mild generalized cerebral atrophy, moderate cerebral white matter chronic small vessel ischemic disease and chronic bilateral basal ganglia lacunar infarcts unchanged from the prior head CT of 04/27/2020. Electronically Signed   By: Kellie Simmering DO   On: 06/19/2020 13:06   DG Knee Complete 4 Views Left  Result Date: 06/19/2020 CLINICAL DATA:  Fall.  Knee pain EXAM: LEFT KNEE - COMPLETE 4+ VIEW COMPARISON:  08/07/2010. FINDINGS: Normal alignment no fracture.  No joint effusion Progressive degenerative changes in the knee with progressive joint space narrowing and chondrocalcinosis. Progressive atherosclerotic calcification. IMPRESSION: Negative for fracture Progressive degenerative change in the knee. Electronically Signed   By: Franchot Gallo M.D.   On: 06/19/2020 13:32    Procedures Procedures (including critical care time)  Medications Ordered in ED Medications  acetaminophen (TYLENOL) tablet 1,000 mg (1,000 mg Oral Given 06/19/20 1314)    ED Course  I have reviewed the triage vital signs and the nursing  notes.  Pertinent labs & imaging results that were available during my care of the patient were reviewed by me and considered in my medical decision making (see chart for details).    MDM Rules/Calculators/A&P                          84 yo F with a cc of a fall.  Patient does remember  exactly how it happened but sounds like she was walking and then lost her balance.  Will obtain a CT scan of the head and a plain film of the left knee.  Reassess.  CT scan without acute intracranial abnormality.  Plain film of the left knee viewed by me without fracture.  Will discharge patient home.  PCP follow-up.  1:56 PM:  I have discussed the diagnosis/risks/treatment options with the patient and believe the pt to be eligible for discharge home to follow-up with PCP. We also discussed returning to the ED immediately if new or worsening sx occur. We discussed the sx which are most concerning (e.g., sudden worsening pain, fever, inability to tolerate by mouth) that necessitate immediate return. Medications administered to the patient during their visit and any new prescriptions provided to the patient are listed below.  Medications given during this visit Medications  acetaminophen (TYLENOL) tablet 1,000 mg (1,000 mg Oral Given 06/19/20 1314)     The patient appears reasonably screen and/or stabilized for discharge and I doubt any other medical condition or other [facility] requiring further screening, evaluation, or treatment in the ED at this time prior to discharge.   Final Clinical Impression(s) / ED Diagnoses Final diagnoses:  Fall, initial encounter  Abrasion of scalp, initial encounter  Acute pain of left knee    Rx / DC Orders ED Discharge Orders    None       Deno Etienne, DO 06/19/20 1356

## 2020-06-19 NOTE — Discharge Instructions (Signed)
Your CT scan of your head and your x-ray of your knee did not show any acute troubling injury.  Please follow-up with your family doctor in the office.  Sometimes swallowing can be a sign that there is something wrong, many family doctors would like to see you and evaluate your gait and maybe have she is seen by physical therapy.  Please return to the ED for recurrent falls confusion inability to eat or drink.

## 2020-06-20 ENCOUNTER — Encounter (HOSPITAL_COMMUNITY): Payer: Self-pay | Admitting: Emergency Medicine

## 2020-07-09 ENCOUNTER — Encounter (HOSPITAL_COMMUNITY): Payer: Self-pay

## 2020-07-09 ENCOUNTER — Emergency Department (HOSPITAL_COMMUNITY): Payer: Medicare Other

## 2020-07-09 ENCOUNTER — Other Ambulatory Visit: Payer: Self-pay

## 2020-07-09 ENCOUNTER — Emergency Department (HOSPITAL_COMMUNITY)
Admission: EM | Admit: 2020-07-09 | Discharge: 2020-07-11 | Disposition: A | Discharge status: Skilled Nursing Facility | Payer: Medicare Other | Source: Home / Self Care | Attending: Emergency Medicine | Admitting: Emergency Medicine

## 2020-07-09 DIAGNOSIS — E860 Dehydration: Secondary | ICD-10-CM | POA: Diagnosis not present

## 2020-07-09 DIAGNOSIS — J45909 Unspecified asthma, uncomplicated: Secondary | ICD-10-CM | POA: Insufficient documentation

## 2020-07-09 DIAGNOSIS — Z794 Long term (current) use of insulin: Secondary | ICD-10-CM | POA: Insufficient documentation

## 2020-07-09 DIAGNOSIS — Z87891 Personal history of nicotine dependence: Secondary | ICD-10-CM | POA: Insufficient documentation

## 2020-07-09 DIAGNOSIS — I129 Hypertensive chronic kidney disease with stage 1 through stage 4 chronic kidney disease, or unspecified chronic kidney disease: Secondary | ICD-10-CM | POA: Insufficient documentation

## 2020-07-09 DIAGNOSIS — S7001XA Contusion of right hip, initial encounter: Secondary | ICD-10-CM | POA: Insufficient documentation

## 2020-07-09 DIAGNOSIS — Z79890 Hormone replacement therapy (postmenopausal): Secondary | ICD-10-CM | POA: Insufficient documentation

## 2020-07-09 DIAGNOSIS — W19XXXA Unspecified fall, initial encounter: Secondary | ICD-10-CM

## 2020-07-09 DIAGNOSIS — N183 Chronic kidney disease, stage 3 unspecified: Secondary | ICD-10-CM | POA: Insufficient documentation

## 2020-07-09 DIAGNOSIS — E1122 Type 2 diabetes mellitus with diabetic chronic kidney disease: Secondary | ICD-10-CM | POA: Insufficient documentation

## 2020-07-09 DIAGNOSIS — E039 Hypothyroidism, unspecified: Secondary | ICD-10-CM | POA: Insufficient documentation

## 2020-07-09 DIAGNOSIS — W1839XA Other fall on same level, initial encounter: Secondary | ICD-10-CM | POA: Insufficient documentation

## 2020-07-09 LAB — COMPREHENSIVE METABOLIC PANEL
ALT: 18 U/L (ref 0–44)
AST: 20 U/L (ref 15–41)
Albumin: 3.5 g/dL (ref 3.5–5.0)
Alkaline Phosphatase: 84 U/L (ref 38–126)
Anion gap: 9 (ref 5–15)
BUN: 21 mg/dL (ref 8–23)
CO2: 30 mmol/L (ref 22–32)
Calcium: 8.7 mg/dL — ABNORMAL LOW (ref 8.9–10.3)
Chloride: 102 mmol/L (ref 98–111)
Creatinine, Ser: 1.3 mg/dL — ABNORMAL HIGH (ref 0.44–1.00)
GFR, Estimated: 41 mL/min — ABNORMAL LOW (ref >60)
Glucose, Bld: 80 mg/dL (ref 70–99)
Potassium: 4.1 mmol/L (ref 3.5–5.1)
Sodium: 141 mmol/L (ref 135–145)
Total Bilirubin: 0.8 mg/dL (ref 0.3–1.2)
Total Protein: 7 g/dL (ref 6.5–8.1)

## 2020-07-09 LAB — URINALYSIS, ROUTINE W REFLEX MICROSCOPIC
Bilirubin Urine: NEGATIVE
Glucose, UA: NEGATIVE mg/dL
Ketones, ur: NEGATIVE mg/dL
Nitrite: NEGATIVE
Protein, ur: NEGATIVE mg/dL
Specific Gravity, Urine: 1.013 (ref 1.005–1.030)
pH: 6 (ref 5.0–8.0)

## 2020-07-09 LAB — CBC WITH DIFFERENTIAL/PLATELET
Abs Immature Granulocytes: 0.03 10*3/uL (ref 0.00–0.07)
Basophils Absolute: 0.1 10*3/uL (ref 0.0–0.1)
Basophils Relative: 1 %
Eosinophils Absolute: 0.2 10*3/uL (ref 0.0–0.5)
Eosinophils Relative: 3 %
HCT: 38.6 % (ref 36.0–46.0)
Hemoglobin: 11.8 g/dL — ABNORMAL LOW (ref 12.0–15.0)
Immature Granulocytes: 0 %
Lymphocytes Relative: 16 %
Lymphs Abs: 1.4 10*3/uL (ref 0.7–4.0)
MCH: 30.4 pg (ref 26.0–34.0)
MCHC: 30.6 g/dL (ref 30.0–36.0)
MCV: 99.5 fL (ref 80.0–100.0)
Monocytes Absolute: 0.6 10*3/uL (ref 0.1–1.0)
Monocytes Relative: 7 %
Neutro Abs: 6.5 10*3/uL (ref 1.7–7.7)
Neutrophils Relative %: 73 %
Platelets: 172 10*3/uL (ref 150–400)
RBC: 3.88 MIL/uL (ref 3.87–5.11)
RDW: 16.7 % — ABNORMAL HIGH (ref 11.5–15.5)
WBC: 8.8 10*3/uL (ref 4.0–10.5)
nRBC: 0 % (ref 0.0–0.2)

## 2020-07-09 LAB — PROTIME-INR
INR: 1.7 — ABNORMAL HIGH (ref 0.8–1.2)
Prothrombin Time: 19.7 seconds — ABNORMAL HIGH (ref 11.4–15.2)

## 2020-07-09 LAB — TYPE AND SCREEN
ABO/RH(D): A POS
Antibody Screen: NEGATIVE

## 2020-07-09 MED ORDER — LINAGLIPTIN 5 MG PO TABS
5.0000 mg | ORAL_TABLET | Freq: Every day | ORAL | Status: DC
  Administered 2020-07-10 (×2): 5 mg via ORAL
  Filled 2020-07-09 (×3): qty 1

## 2020-07-09 MED ORDER — FENTANYL CITRATE (PF) 100 MCG/2ML IJ SOLN
50.0000 ug | Freq: Once | INTRAMUSCULAR | Status: AC
  Administered 2020-07-09: 50 ug via INTRAVENOUS
  Filled 2020-07-09: qty 2

## 2020-07-09 MED ORDER — REPAGLINIDE 1 MG PO TABS
0.5000 mg | ORAL_TABLET | Freq: Three times a day (TID) | ORAL | Status: DC
  Administered 2020-07-10: 0.5 mg via ORAL
  Filled 2020-07-09 (×6): qty 0.5

## 2020-07-09 MED ORDER — LEVOTHYROXINE SODIUM 88 MCG PO TABS
88.0000 ug | ORAL_TABLET | Freq: Every day | ORAL | Status: DC
  Administered 2020-07-10: 88 ug via ORAL
  Filled 2020-07-09: qty 1

## 2020-07-09 MED ORDER — GABAPENTIN 300 MG PO CAPS: 300.0000 mg | ORAL_CAPSULE | ORAL | Status: DC

## 2020-07-09 MED ORDER — ONDANSETRON HCL 4 MG PO TABS: 4.0000 mg | ORAL_TABLET | Freq: Three times a day (TID) | ORAL | Status: DC | PRN

## 2020-07-09 MED ORDER — BUPROPION HCL ER (XL) 150 MG PO TB24
450.0000 mg | ORAL_TABLET | Freq: Every day | ORAL | Status: DC
  Administered 2020-07-10: 450 mg via ORAL
  Filled 2020-07-09: qty 3

## 2020-07-09 MED ORDER — EZETIMIBE 10 MG PO TABS
10.0000 mg | ORAL_TABLET | Freq: Every day | ORAL | Status: DC
  Administered 2020-07-10: 10 mg via ORAL
  Filled 2020-07-09: qty 1

## 2020-07-09 MED ORDER — ACETAMINOPHEN 325 MG PO TABS: 650.0000 mg | ORAL_TABLET | Freq: Four times a day (QID) | ORAL | Status: DC | PRN

## 2020-07-09 MED ORDER — MECLIZINE HCL 25 MG PO TABS: 25.0000 mg | ORAL_TABLET | Freq: Every evening | ORAL | Status: DC | PRN

## 2020-07-09 MED ORDER — INSULIN GLARGINE 100 UNIT/ML ~~LOC~~ SOLN
7.0000 [IU] | Freq: Every day | SUBCUTANEOUS | Status: DC
  Administered 2020-07-10: 7 [IU] via SUBCUTANEOUS
  Filled 2020-07-09: qty 0.07

## 2020-07-09 MED ORDER — CARVEDILOL 12.5 MG PO TABS
12.5000 mg | ORAL_TABLET | Freq: Two times a day (BID) | ORAL | Status: DC
  Administered 2020-07-10 (×2): 12.5 mg via ORAL
  Filled 2020-07-09 (×2): qty 1

## 2020-07-09 MED ORDER — INSULIN ASPART 100 UNIT/ML ~~LOC~~ SOLN
0.0000 [IU] | Freq: Three times a day (TID) | SUBCUTANEOUS | Status: DC
  Filled 2020-07-09: qty 0.12

## 2020-07-09 MED ORDER — RIVAROXABAN 20 MG PO TABS
20.0000 mg | ORAL_TABLET | Freq: Every day | ORAL | Status: DC
  Administered 2020-07-10 (×2): 20 mg via ORAL
  Filled 2020-07-09 (×2): qty 1

## 2020-07-09 MED ORDER — ATORVASTATIN CALCIUM 10 MG PO TABS
10.0000 mg | ORAL_TABLET | Freq: Every day | ORAL | Status: DC
  Administered 2020-07-10 (×2): 10 mg via ORAL
  Filled 2020-07-09 (×2): qty 1

## 2020-07-09 MED ORDER — SERTRALINE HCL 50 MG PO TABS
75.0000 mg | ORAL_TABLET | Freq: Every day | ORAL | Status: DC
  Administered 2020-07-10: 75 mg via ORAL
  Filled 2020-07-09: qty 2

## 2020-07-09 MED ORDER — LORAZEPAM 0.5 MG PO TABS
0.5000 mg | ORAL_TABLET | Freq: Three times a day (TID) | ORAL | Status: DC | PRN
  Administered 2020-07-10: 0.5 mg via ORAL
  Filled 2020-07-09: qty 1

## 2020-07-09 MED ORDER — DOCUSATE SODIUM 100 MG PO CAPS
100.0000 mg | ORAL_CAPSULE | Freq: Every day | ORAL | Status: DC
  Administered 2020-07-10: 100 mg via ORAL
  Filled 2020-07-09: qty 1

## 2020-07-09 NOTE — ED Provider Notes (Signed)
[facility] DEPT Provider Note   CSN: [id number] Arrival date & time: 07/09/20  1547     History Chief Complaint  Patient presents with  . Fall    Dawn Morrow is a 84 y.o. female.  The history is provided by the patient and medical records.  Fall   Dawn Morrow is a 84 y.o. female who presents to the Emergency Department complaining of fall.  She is a resident at [facility].  She presents to the ED by EMS for evaluation of injuries following a fall that occurred this morning.  She was attempting to get in the shower when her foot caught on a rug and she fell to the ground.  Denies recent illnesses, head injury, LOC, CP, SOB, abdominal pain.  She has pain in her right hip/leg and cannot bare weight secondary to pain.  She does use a wheelchair but stands to transfer without assistance. Nanine Means [phone] Ferdinand Cava [phone]    Past Medical History:  Diagnosis Date  . Anxiety and depression   . Asthma   . Cataract   . COPD (chronic obstructive pulmonary disease) ([place])   . Diabetes mellitus   . Diverticulosis   . DVT (deep vein thrombosis) in pregnancy    hx x multiple on coumadin  . GERD (gastroesophageal reflux disease)   . Hemorrhoids   . Hiatal hernia   . Hyperlipidemia   . Hypertension   . Hypothyroidism   . On home oxygen therapy    "2L; 24/7" (09/17/2017)  . Osteoarthritis    h/o spinal stenosis by MRI 2009  . Osteopenia   . Pulmonary embolism (HCC)    x 2  . Stroke [facility])    MINI  . Tubular adenoma of colon 07/2005    Patient Active Problem List   Diagnosis Date Noted  . Acute metabolic encephalopathy 07/04/2535  . AMS (altered mental status) 03/26/2020  . History of pulmonary embolism 03/26/2020  . History of DVT (deep vein thrombosis) 03/26/2020  . Atrial fibrillation, chronic ([place]) 03/26/2020  . Sepsis ([place]) 12/14/2019  . Acute encephalopathy 12/14/2019  . COVID-19 virus infection 10/02/2019  . Acute lower  UTI 10/02/2019  . Acute kidney failure ([place]) 10/01/2019  . Syncope 09/18/2017  . Near syncope 09/17/2017  . Diabetes mellitus type 2 in obese ([place]) 09/17/2017  . Atrial fibrillation with RVR ([place]) 09/17/2017  . Hypomagnesemia 09/17/2017  . CKD (chronic kidney disease), stage III ([place]) 09/17/2017  . Forehead contusion   . MGUS (monoclonal gammopathy of unknown significance) 08/13/2016  . Renal failure 07/09/2015  . Follow-up ---------PCP NOTES 05/17/2015  . Essential tremor 12/28/2014  . Ulnar neuropathy of left upper extremity 12/28/2014  . Intractable pain 06/21/2014  . Back pain 06/21/2014  . Lumbar radiculopathy 06/21/2014  . Encounter for therapeutic drug monitoring 03/14/2014  . Numbness 10/18/2013  . TIA (transient ischemic attack) 09/19/2013  . Weakness 09/18/2013  . Annual physical exam 05/30/2012  . Tremor 01/21/2012  . Failure to thrive and poor med compliance  01/21/2012  . Pulmonary embolism ([place]) 11/18/2010  . DVT (deep venous thrombosis) ([place]) 11/18/2010  . Long term current use of anticoagulant 11/18/2010  . ABNORMAL ELECTROCARDIOGRAM 11/10/2010  . OSTEOARTHRITIS 08/06/2010  . DIZZINESS 04/15/2010  . Diabetes ([place]) 06/04/2009  . ACNE ROSACEA 11/28/2008  . BACK PAIN 05/16/2008  . HIP PAIN, RIGHT, CHRONIC 09/15/2007  . Hypothyroidism 09/13/2007  . Dyslipidemia 09/13/2007  . Osteoporosis 07/22/2007  . DEPRESSION 09/11/2006  . HTN (hypertension)  09/11/2006  . ASTHMA 09/11/2006  . COPD (chronic obstructive pulmonary disease) ([place]) 09/11/2006  . GERD 09/11/2006  . Recurrent UTI 09/11/2006  . [place] FILTER INSERTION, HX OF 09/11/2006    Past Surgical History:  Procedure Laterality Date  . APPENDECTOMY    . CATARACT EXTRACTION    . CHOLECYSTECTOMY    . LUMBAR FUSION    . POLYPECTOMY    . TONSILLECTOMY    . TUBAL LIGATION       OB History   No obstetric history on file.     Family History  Adopted: Yes  Problem Relation Age of Onset  . Cancer  Mother        ? colon or ovarian  . Diabetes Mother   . Cancer Brother        ?  . CAD Neg Hx     Social History   Tobacco Use  . Smoking status: Former Smoker    Types: Cigarettes  . Smokeless tobacco: Never Used  Vaping Use  . Vaping Use: Never used  Substance Use Topics  . Alcohol use: No    Comment: socially  . Drug use: No    Home Medications Prior to Admission medications   Medication Sig Start Date End Date Taking? Authorizing Provider  acetaminophen (TYLENOL) 500 MG tablet Take 500 mg by mouth every 4 (four) hours as needed (pain).    Yes [provider]  albuterol (PROVENTIL HFA;VENTOLIN HFA) 108 (90 BASE) MCG/ACT inhaler Inhale 2 puffs into the lungs every 6 (six) hours as needed for wheezing or shortness of breath. 08/05/15  Yes Saguier, Percell Miller, PA-C  atorvastatin (LIPITOR) 10 MG tablet Take 1 tablet (10 mg total) by mouth at bedtime. 10/21/15  Yes Paz, Alda Berthold, MD  buPROPion HCl ER, XL, 450 MG TB24 Take 450 mg by mouth daily.  11/20/19  Yes [provider]  carvedilol (COREG) 12.5 MG tablet Take 1 tablet (12.5 mg total) by mouth 2 (two) times daily with a meal. 05/06/20  Yes Dessa Phi, DO  docusate sodium (COLACE) 100 MG capsule Take 100 mg by mouth daily.   Yes [provider]  ezetimibe (ZETIA) 10 MG tablet Take 10 mg by mouth daily.   Yes [provider]  gabapentin (NEURONTIN) 300 MG capsule Take 1 cap in AM, 1 cap at noon, 2 caps at bedtime Patient taking differently: Take 300-600 mg by mouth See admin instructions. Take one capsule (300 mg) by mouth at 9am and 2pm, take two capsules (600 mg) at bedtime 12/21/14  Yes Cameron Sprang, MD  insulin aspart (NOVOLOG FLEXPEN) 100 UNIT/ML FlexPen Inject 0-12 Units into the skin See admin instructions. Inject 0-12 units subcutaneously four times daily - before meals and at bedtime - per sliding scale: CBG 0-150 0 units, 151-200 4 units, 201-250 6 units, 251-300 8 units, 301-350 10 units,  351-400 12 units, >400 12 units   Yes [provider]  insulin glargine (LANTUS) 100 UNIT/ML injection Inject 0.07 mLs (7 Units total) into the skin daily. 05/06/20 09/26/20 Yes Dessa Phi, DO  ketoconazole (NIZORAL) 2 % shampoo Apply 1 application topically See admin instructions. Use topically to shampoo hair twice weekly - Tuesday and Friday   Yes [provider]  levothyroxine (SYNTHROID) 88 MCG tablet Take 88 mcg by mouth daily before breakfast.   Yes [provider]  linagliptin (TRADJENTA) 5 MG TABS tablet Take 1 tablet (5 mg total) by mouth at bedtime. 07/15/16  Yes Paz, Elderon  E, MD  LORazepam (ATIVAN) 0.5 MG tablet Take 1 tablet (0.5 mg total) by mouth every 8 (eight) hours as needed for anxiety. 05/06/20  Yes Dessa Phi, DO  meclizine (ANTIVERT) 25 MG tablet Take 25 mg by mouth at bedtime as needed for dizziness.   Yes [provider]  ondansetron (ZOFRAN) 4 MG tablet Take 4 mg by mouth every 8 (eight) hours as needed for nausea or vomiting.   Yes [provider]  polyethylene glycol (MIRALAX / GLYCOLAX) 17 g packet Take 17 g by mouth daily as needed for mild constipation or moderate constipation.   Yes [provider]  Propylene Glycol (SYSTANE BALANCE) 0.6 % SOLN Place 1 drop into both eyes 4 (four) times daily as needed (for dry eyes).    Yes [provider]  repaglinide (PRANDIN) 0.5 MG tablet Take 1 tablet (0.5 mg total) by mouth 3 (three) times daily before meals. Patient taking differently: Take 0.5 mg by mouth 3 (three) times daily before meals. Hold for skipped meals 01/14/16  Yes Paz, Alda Berthold, MD  rivaroxaban (XARELTO) 20 MG TABS tablet Take 1 tablet (20 mg total) by mouth daily with supper. Patient taking differently: Take 20 mg by mouth every evening.  09/14/14  Yes Paz, Alda Berthold, MD  sertraline (ZOLOFT) 50 MG tablet Take 75 mg by mouth daily.  11/20/19  Yes [provider]  OXYGEN Inhale 3 L into the lungs  continuous.    [provider]  OXYGEN Inhale 4 L/min into the lungs continuous.    [provider]  clindamycin (CLEOCIN) 300 MG capsule Take 1 capsule (300 mg total) by mouth 3 (three) times daily. Patient not taking: Reported on 07/02/2017 06/17/17 07/02/17  Colon Branch, MD    Allergies    Penicillins, Codeine, Penicillins, Sulfonylureas, Sulfonylureas, Codeine, and Sulfonamide derivatives  Review of Systems   Review of Systems  All other systems reviewed and are negative.   Physical Exam Updated Vital Signs BP (!) 172/102   Pulse 82   Temp 97.9 F (36.6 C) (Oral)   Resp 18   SpO2 96%   Physical Exam Vitals and nursing note reviewed.  Constitutional:      Appearance: She is well-developed.  HENT:     Head: Normocephalic and atraumatic.  Cardiovascular:     Rate and Rhythm: Normal rate and regular rhythm.     Heart sounds: No murmur heard.   Pulmonary:     Effort: Pulmonary effort is normal. No respiratory distress.     Breath sounds: Normal breath sounds.  Abdominal:     Palpations: Abdomen is soft.     Tenderness: There is no abdominal tenderness. There is no guarding or rebound.  Musculoskeletal:        General: Tenderness present.     Comments: 2+ DP pulses bilaterally. RLE is externally rotated and shortened.  TTP over right hip and proximal thigh.    Skin:    General: Skin is warm and dry.  Neurological:     Mental Status: She is alert and oriented to person, place, and time.  Psychiatric:        Behavior: Behavior normal.     ED Results / Procedures / Treatments   Labs (all labs ordered are listed, but only abnormal results are displayed) Labs Reviewed  COMPREHENSIVE METABOLIC PANEL - Abnormal; Notable for the following components:      Result Value   Creatinine, Ser 1.30 (*)    Calcium 8.7 (*)  GFR, Estimated 41 (*)    All other components within normal limits  CBC WITH DIFFERENTIAL/PLATELET - Abnormal; Notable for the  following components:   Hemoglobin 11.8 (*)    RDW 16.7 (*)    All other components within normal limits  PROTIME-INR - Abnormal; Notable for the following components:   Prothrombin Time 19.7 (*)    INR 1.7 (*)    All other components within normal limits  URINALYSIS, ROUTINE W REFLEX MICROSCOPIC - Abnormal; Notable for the following components:   Hgb urine dipstick SMALL (*)    Leukocytes,Ua TRACE (*)    Bacteria, UA FEW (*)    All other components within normal limits  URINE CULTURE  TYPE AND SCREEN    EKG None  Radiology DG Chest 1 View  Result Date: 07/09/2020 CLINICAL DATA:  Fall. Additional history provided: Former smoker, COPD, hypertension, history of tubular adenoma of colon 2006, history of asthma. EXAM: CHEST  1 VIEW COMPARISON:  Prior chest radiographs 04/27/2020. FINDINGS: Shallow inspiration radiograph. Cardiomegaly. Aortic atherosclerosis. Chronic prominence of the interstitial lung markings. No appreciable airspace consolidation. No evidence of pleural effusion or pneumothorax. No acute bony abnormality identified. Partially imaged metallic foreign body projecting over the upper lumbar spine, appearing most suggestive of an IVC filter. IMPRESSION: Shallow inspiration radiograph. No evidence of acute cardiopulmonary abnormality. Chronic prominence of the interstitial lung markings. Unchanged cardiomegaly. Aortic Atherosclerosis (ICD10-I70.0). Electronically Signed   By: Kellie Simmering DO   On: 07/09/2020 17:43   CT Head Wo Contrast  Result Date: 07/09/2020 CLINICAL DATA:  [place] EXAM: CT HEAD WITHOUT CONTRAST TECHNIQUE: Contiguous axial images were obtained from the base of the skull through the vertex without intravenous contrast. COMPARISON:  04/27/2020 FINDINGS: Brain: Chronic hypodensities within the periventricular white matter and bilateral basal ganglia are stable, consistent with chronic small vessel ischemic changes. No acute infarct or hemorrhage. Lateral ventricles  and remaining midline structures are unremarkable. No acute extra-axial fluid collections. No mass effect. Vascular: No hyperdense vessel or unexpected calcification. Skull: Normal. Negative for fracture or focal lesion. Minimal residual left frontal scalp hematoma, markedly decreased since previous exam. Sinuses/Orbits: No acute finding. Other: None. IMPRESSION: 1. No acute intracranial process. 2. Stable chronic small-vessel ischemic changes throughout the white matter and bilateral basal ganglia. Electronically Signed   By: Randa Ngo M.D.   On: 07/09/2020 17:37   CT Hip Right Wo Contrast  Result Date: 07/09/2020 CLINICAL DATA:  Fall in the way to the bathroom. Right hip/leg pain. EXAM: CT OF THE RIGHT HIP WITHOUT CONTRAST TECHNIQUE: Multidetector CT imaging of the right hip was performed according to the standard protocol. Multiplanar CT image reconstructions were also generated. COMPARISON:  Radiograph earlier today. FINDINGS: Bones/Joint/Cartilage No acute fracture. No dislocation. Right hip osteoarthritis with joint space narrowing and acetabular spurring. No avascular necrosis. No focal bone lesion. Degenerative change of the pubic symphysis with osteophytes. No significant hip joint effusion. Ligaments Suboptimally assessed by CT. Muscles and Tendons No intramuscular hematoma.  Muscle bulk is maintained. Soft tissues Mild skin thickening and soft tissue edema laterally. No confluent soft tissue hematoma. Diverticulosis in the sigmoid colon partially included. IMPRESSION: 1. No acute fracture or dislocation of the right hip. 2. Right hip osteoarthritis. Electronically Signed   By: Keith Rake M.D.   On: 07/09/2020 18:55   DG Hip Unilat W or Wo Pelvis 2-3 Views Right  Result Date: 07/09/2020 CLINICAL DATA:  Fall on the way to the bathroom.  Right hip pain. EXAM: DG  HIP (WITH OR WITHOUT PELVIS) 2-3V RIGHT COMPARISON:  None. FINDINGS: The cortical margins of the bony pelvis and right hip are  intact. No fracture. Pubic symphysis and sacroiliac joints are congruent. Both femoral heads are well-seated in the respective acetabula. Minimal degenerative spurring of the acetabulum. There may be soft tissue edema laterally versus habitus. IMPRESSION: No fracture of the pelvis or right hip. Electronically Signed   By: Keith Rake M.D.   On: 07/09/2020 17:40   DG Femur Min 2 Views Right  Result Date: 07/09/2020 CLINICAL DATA:  Recent fall with right leg pain, initial encounter EXAM: RIGHT FEMUR 2 VIEWS COMPARISON:  None. FINDINGS: Degenerative changes about the knee joint are noted most prominent medially. No acute fracture or dislocation is noted. No joint effusion is seen. No gross soft tissue abnormality is noted. IMPRESSION: Degenerative change without acute abnormality. Electronically Signed   By: Inez Catalina M.D.   On: 07/09/2020 18:29    Procedures Procedures (including critical care time)  Medications Ordered in ED Medications  fentaNYL (SUBLIMAZE) injection 50 mcg (50 mcg Intravenous Given 07/09/20 1642)    ED Course  I have reviewed the triage vital signs and the nursing notes.  Pertinent labs & imaging results that were available during my care of the patient were reviewed by me and considered in my medical decision making (see chart for details).    MDM Rules/Calculators/A&P                         patient here for evaluation of injuries following a mechanical fall. She has pain on palpation and range of motion of the right hip. Imaging is negative for acute fracture. She was treated with pain medications in the emergency department. Despite pain medication she is unable to bear weight to transfer at her baseline. Attempted multiple times by myself as well as nursing staff and social work to [employer] for additional history and to see what is available to the patient for ongoing care and there is no answer. Attempted to contact patient's family as well, with no  answer. Patient will board in the emergency department for reassessment in the morning pending additional conversations with social work and [place] as well as PT consult. Patient care transferred pending social work assistance.  Final Clinical Impression(s) / ED Diagnoses Final diagnoses:  Fall, initial encounter  Contusion of right hip, initial encounter    Rx / DC Orders ED Discharge Orders    None       Quintella Reichert, MD 07/09/20 2310

## 2020-07-09 NOTE — ED Triage Notes (Addendum)
Pt arrived via [facility] from [facility]. Pt fell on the way to the bathroom. Pt complains of soreness in right leg.  Vitals from EMS BP: 158/84 HR: 70 RR: 16 O2: 92% RA Temp: 98.2

## 2020-07-09 NOTE — Social Work (Addendum)
CSW attempted to contact [facility] via phone to discuss d/c options.  No answer left message for callback. CSW will also leave handoff for first-shift CSW coverage.

## 2020-07-09 NOTE — ED Notes (Signed)
Pt unable to ambulate. Pt moved right leg slightly in the bed and complained of intense pain.

## 2020-07-10 LAB — CBG MONITORING, ED
Glucose-Capillary: 121 mg/dL — ABNORMAL HIGH (ref 70–99)
Glucose-Capillary: 65 mg/dL — ABNORMAL LOW (ref 70–99)
Glucose-Capillary: 74 mg/dL (ref 70–99)
Glucose-Capillary: 78 mg/dL (ref 70–99)
Glucose-Capillary: 95 mg/dL (ref 70–99)
Glucose-Capillary: 99 mg/dL (ref 70–99)
Glucose-Capillary: 99 mg/dL (ref 70–99)

## 2020-07-10 NOTE — ED Notes (Signed)
This nurse [facility] who states they are unaware of placement for patient and will have to wait until tomorrow morning to speak with management.

## 2020-07-10 NOTE — ED Provider Notes (Signed)
SW has discussed pt with [facility].  Pt does have PT and OT at the facility.  She is stable for d/c back to facility.   Isla Pence, MD 07/10/20 2128

## 2020-07-10 NOTE — ED Notes (Signed)
[id number] MD aware. Patient alert and talking. administered 168ml of OJ and peanut butter and crackers. Diet ordered.

## 2020-07-10 NOTE — ED Notes (Signed)
[facility] called for transport.  

## 2020-07-10 NOTE — ED Notes (Signed)
This nurse left a message with social work for plan for discharge

## 2020-07-10 NOTE — Progress Notes (Addendum)
CSW received a call from pt's EPD stating pt is ready for D/C.  CSW called [facility] at [address] at ph: [phone] and spoke to [facility] who states pt can come back and that report "is not needed" and that pt will be going to room 45.  CSW thanked Denisha and updated pt's RN in the ED.  CSW will update the EPD.  CSW will continue to follow for D/C needs.  Alphonse Guild. Deslyn Cavenaugh  MSW, LCSW, LCAS, CCS Transitions of Care Clinical Social Worker Care Coordination Department Ph: [phone]

## 2020-07-10 NOTE — Progress Notes (Signed)
TOC CM/CSW spoke with Kaley/[facility] [phone].  Pt currently receives PT 1 to 2 times weekly.  For more extensive PT pt went to [facility].  Pt has OT starting this week at [facility].   What lead the pt to the ED was when NT took pt into the bathroom to give her a shower.  Pt went to get up from toilet to get into shower while NT briefly stepped away to grab pts towel.  Pt was aware that she needs assistance with standing per NT.  Pt has no children or husband.  Her nephew/Joseph [phone] is a person of contact.  Best way to contact [facility] is by text.  CSW will continue to follow for dc needs.  Orlandus Borowski Tarpley-Carter, MSW, LCSW-A Pronouns:  She, Her, Hers                  [facility] Transitions of CareClinical Social Worker [email or web address] [phone]

## 2020-07-11 ENCOUNTER — Encounter (HOSPITAL_COMMUNITY): Payer: Self-pay

## 2020-07-11 ENCOUNTER — Emergency Department (HOSPITAL_COMMUNITY): Payer: Medicare Other

## 2020-07-11 ENCOUNTER — Inpatient Hospital Stay (HOSPITAL_COMMUNITY)
Admission: EM | Admit: 2020-07-11 | Discharge: 2020-07-19 | DRG: 641 | Disposition: A | Discharge status: Skilled Nursing Facility | Payer: Medicare Other | Source: Skilled Nursing Facility | Attending: Internal Medicine | Admitting: Internal Medicine

## 2020-07-11 ENCOUNTER — Other Ambulatory Visit: Payer: Self-pay

## 2020-07-11 DIAGNOSIS — F4321 Adjustment disorder with depressed mood: Secondary | ICD-10-CM

## 2020-07-11 DIAGNOSIS — I1310 Hypertensive heart and chronic kidney disease without heart failure, with stage 1 through stage 4 chronic kidney disease, or unspecified chronic kidney disease: Secondary | ICD-10-CM | POA: Diagnosis present

## 2020-07-11 DIAGNOSIS — N1832 Chronic kidney disease, stage 3b: Secondary | ICD-10-CM | POA: Diagnosis present

## 2020-07-11 DIAGNOSIS — Z981 Arthrodesis status: Secondary | ICD-10-CM

## 2020-07-11 DIAGNOSIS — D6859 Other primary thrombophilia: Secondary | ICD-10-CM | POA: Diagnosis present

## 2020-07-11 DIAGNOSIS — E669 Obesity, unspecified: Secondary | ICD-10-CM | POA: Diagnosis present

## 2020-07-11 DIAGNOSIS — E440 Moderate protein-calorie malnutrition: Secondary | ICD-10-CM | POA: Insufficient documentation

## 2020-07-11 DIAGNOSIS — Z8673 Personal history of transient ischemic attack (TIA), and cerebral infarction without residual deficits: Secondary | ICD-10-CM

## 2020-07-11 DIAGNOSIS — Z20822 Contact with and (suspected) exposure to covid-19: Secondary | ICD-10-CM | POA: Diagnosis present

## 2020-07-11 DIAGNOSIS — Z86711 Personal history of pulmonary embolism: Secondary | ICD-10-CM

## 2020-07-11 DIAGNOSIS — R296 Repeated falls: Secondary | ICD-10-CM | POA: Diagnosis present

## 2020-07-11 DIAGNOSIS — Z885 Allergy status to narcotic agent status: Secondary | ICD-10-CM

## 2020-07-11 DIAGNOSIS — Z833 Family history of diabetes mellitus: Secondary | ICD-10-CM

## 2020-07-11 DIAGNOSIS — Z880 Allergy status to penicillin: Secondary | ICD-10-CM

## 2020-07-11 DIAGNOSIS — Z79890 Hormone replacement therapy (postmenopausal): Secondary | ICD-10-CM

## 2020-07-11 DIAGNOSIS — Z6830 Body mass index (BMI) 30.0-30.9, adult: Secondary | ICD-10-CM

## 2020-07-11 DIAGNOSIS — M25551 Pain in right hip: Secondary | ICD-10-CM | POA: Diagnosis not present

## 2020-07-11 DIAGNOSIS — Z7901 Long term (current) use of anticoagulants: Secondary | ICD-10-CM

## 2020-07-11 DIAGNOSIS — K219 Gastro-esophageal reflux disease without esophagitis: Secondary | ICD-10-CM | POA: Diagnosis present

## 2020-07-11 DIAGNOSIS — W07XXXA Fall from chair, initial encounter: Secondary | ICD-10-CM | POA: Diagnosis present

## 2020-07-11 DIAGNOSIS — E1122 Type 2 diabetes mellitus with diabetic chronic kidney disease: Secondary | ICD-10-CM | POA: Diagnosis present

## 2020-07-11 DIAGNOSIS — E785 Hyperlipidemia, unspecified: Secondary | ICD-10-CM | POA: Diagnosis present

## 2020-07-11 DIAGNOSIS — Z87891 Personal history of nicotine dependence: Secondary | ICD-10-CM

## 2020-07-11 DIAGNOSIS — Z66 Do not resuscitate: Secondary | ICD-10-CM | POA: Diagnosis not present

## 2020-07-11 DIAGNOSIS — W19XXXA Unspecified fall, initial encounter: Secondary | ICD-10-CM | POA: Diagnosis not present

## 2020-07-11 DIAGNOSIS — J9612 Chronic respiratory failure with hypercapnia: Secondary | ICD-10-CM | POA: Diagnosis present

## 2020-07-11 DIAGNOSIS — F0390 Unspecified dementia without behavioral disturbance: Secondary | ICD-10-CM | POA: Diagnosis present

## 2020-07-11 DIAGNOSIS — S7001XA Contusion of right hip, initial encounter: Secondary | ICD-10-CM | POA: Diagnosis present

## 2020-07-11 DIAGNOSIS — Y92099 Unspecified place in other non-institutional residence as the place of occurrence of the external cause: Secondary | ICD-10-CM

## 2020-07-11 DIAGNOSIS — F419 Anxiety disorder, unspecified: Secondary | ICD-10-CM | POA: Diagnosis present

## 2020-07-11 DIAGNOSIS — Z794 Long term (current) use of insulin: Secondary | ICD-10-CM

## 2020-07-11 DIAGNOSIS — Z79899 Other long term (current) drug therapy: Secondary | ICD-10-CM

## 2020-07-11 DIAGNOSIS — E860 Dehydration: Principal | ICD-10-CM | POA: Diagnosis present

## 2020-07-11 DIAGNOSIS — R4182 Altered mental status, unspecified: Secondary | ICD-10-CM | POA: Diagnosis not present

## 2020-07-11 DIAGNOSIS — J449 Chronic obstructive pulmonary disease, unspecified: Secondary | ICD-10-CM | POA: Diagnosis present

## 2020-07-11 DIAGNOSIS — E039 Hypothyroidism, unspecified: Secondary | ICD-10-CM | POA: Diagnosis present

## 2020-07-11 DIAGNOSIS — I4819 Other persistent atrial fibrillation: Secondary | ICD-10-CM | POA: Diagnosis present

## 2020-07-11 DIAGNOSIS — E1140 Type 2 diabetes mellitus with diabetic neuropathy, unspecified: Secondary | ICD-10-CM | POA: Diagnosis present

## 2020-07-11 DIAGNOSIS — D72829 Elevated white blood cell count, unspecified: Secondary | ICD-10-CM | POA: Diagnosis present

## 2020-07-11 DIAGNOSIS — Z87440 Personal history of urinary (tract) infections: Secondary | ICD-10-CM

## 2020-07-11 DIAGNOSIS — Z882 Allergy status to sulfonamides status: Secondary | ICD-10-CM

## 2020-07-11 DIAGNOSIS — J9611 Chronic respiratory failure with hypoxia: Secondary | ICD-10-CM | POA: Diagnosis present

## 2020-07-11 LAB — CBC WITH DIFFERENTIAL/PLATELET
Abs Immature Granulocytes: 0.15 10*3/uL — ABNORMAL HIGH (ref 0.00–0.07)
Basophils Absolute: 0 10*3/uL (ref 0.0–0.1)
Basophils Relative: 0 %
Eosinophils Absolute: 0.2 10*3/uL (ref 0.0–0.5)
Eosinophils Relative: 2 %
HCT: 41.6 % (ref 36.0–46.0)
Hemoglobin: 12.8 g/dL (ref 12.0–15.0)
Immature Granulocytes: 1 %
Lymphocytes Relative: 11 %
Lymphs Abs: 1.2 10*3/uL (ref 0.7–4.0)
MCH: 30 pg (ref 26.0–34.0)
MCHC: 30.8 g/dL (ref 30.0–36.0)
MCV: 97.4 fL (ref 80.0–100.0)
Monocytes Absolute: 0.7 10*3/uL (ref 0.1–1.0)
Monocytes Relative: 6 %
Neutro Abs: 9.3 10*3/uL — ABNORMAL HIGH (ref 1.7–7.7)
Neutrophils Relative %: 80 %
Platelets: 175 10*3/uL (ref 150–400)
RBC: 4.27 MIL/uL (ref 3.87–5.11)
RDW: 16.4 % — ABNORMAL HIGH (ref 11.5–15.5)
WBC: 11.6 10*3/uL — ABNORMAL HIGH (ref 4.0–10.5)
nRBC: 0 % (ref 0.0–0.2)

## 2020-07-11 LAB — RESPIRATORY PANEL BY RT PCR (FLU A&B, COVID)
Influenza A by PCR: NEGATIVE
Influenza B by PCR: NEGATIVE
SARS Coronavirus 2 by RT PCR: NEGATIVE

## 2020-07-11 LAB — BASIC METABOLIC PANEL
Anion gap: 11 (ref 5–15)
BUN: 21 mg/dL (ref 8–23)
CO2: 29 mmol/L (ref 22–32)
Calcium: 9.1 mg/dL (ref 8.9–10.3)
Chloride: 98 mmol/L (ref 98–111)
Creatinine, Ser: 1.08 mg/dL — ABNORMAL HIGH (ref 0.44–1.00)
GFR, Estimated: 51 mL/min — ABNORMAL LOW (ref >60)
Glucose, Bld: 115 mg/dL — ABNORMAL HIGH (ref 70–99)
Potassium: 4.1 mmol/L (ref 3.5–5.1)
Sodium: 138 mmol/L (ref 135–145)

## 2020-07-11 LAB — GLUCOSE, CAPILLARY
Glucose-Capillary: 81 mg/dL (ref 70–99)
Glucose-Capillary: 119 mg/dL — ABNORMAL HIGH (ref 70–99)

## 2020-07-11 LAB — URINE CULTURE

## 2020-07-11 LAB — HEMOGLOBIN A1C
Hgb A1c MFr Bld: 5.1 % (ref 4.8–5.6)
Mean Plasma Glucose: 99.67 mg/dL

## 2020-07-11 MED ORDER — LEVOTHYROXINE SODIUM 88 MCG PO TABS
88.0000 ug | ORAL_TABLET | Freq: Every day | ORAL | Status: DC
  Administered 2020-07-12 – 2020-07-19 (×8): 88 ug via ORAL
  Filled 2020-07-11 (×8): qty 1

## 2020-07-11 MED ORDER — LORAZEPAM 0.5 MG PO TABS: 0.5000 mg | ORAL_TABLET | Freq: Three times a day (TID) | ORAL | Status: DC | PRN

## 2020-07-11 MED ORDER — ACETAMINOPHEN 650 MG RE SUPP: 650.0000 mg | Freq: Four times a day (QID) | RECTAL | Status: DC | PRN

## 2020-07-11 MED ORDER — INSULIN ASPART 100 UNIT/ML ~~LOC~~ SOLN
0.0000 [IU] | Freq: Three times a day (TID) | SUBCUTANEOUS | Status: DC
  Administered 2020-07-12 – 2020-07-14 (×4): 2 [IU] via SUBCUTANEOUS
  Administered 2020-07-16: 13:00:00 3 [IU] via SUBCUTANEOUS
  Administered 2020-07-16: 2 [IU] via SUBCUTANEOUS
  Administered 2020-07-17: 3 [IU] via SUBCUTANEOUS
  Administered 2020-07-18 (×2): 2 [IU] via SUBCUTANEOUS
  Administered 2020-07-19: 3 [IU] via SUBCUTANEOUS

## 2020-07-11 MED ORDER — ONDANSETRON HCL 4 MG PO TABS: 4.0000 mg | ORAL_TABLET | Freq: Four times a day (QID) | ORAL | Status: DC | PRN

## 2020-07-11 MED ORDER — DOCUSATE SODIUM 100 MG PO CAPS
100.0000 mg | ORAL_CAPSULE | Freq: Every day | ORAL | Status: DC
  Administered 2020-07-11 – 2020-07-19 (×9): 100 mg via ORAL
  Filled 2020-07-11 (×9): qty 1

## 2020-07-11 MED ORDER — ORAL CARE MOUTH RINSE
15.0000 mL | Freq: Two times a day (BID) | OROMUCOSAL | Status: DC
  Administered 2020-07-11 – 2020-07-19 (×16): 15 mL via OROMUCOSAL

## 2020-07-11 MED ORDER — ONDANSETRON HCL 4 MG/2ML IJ SOLN: 4.0000 mg | Freq: Four times a day (QID) | INTRAMUSCULAR | Status: DC | PRN

## 2020-07-11 MED ORDER — EZETIMIBE 10 MG PO TABS
10.0000 mg | ORAL_TABLET | Freq: Every day | ORAL | Status: DC
  Administered 2020-07-12 – 2020-07-19 (×8): 10 mg via ORAL
  Filled 2020-07-11 (×8): qty 1

## 2020-07-11 MED ORDER — BUPROPION HCL ER (XL) 150 MG PO TB24
450.0000 mg | ORAL_TABLET | Freq: Every day | ORAL | Status: DC
  Administered 2020-07-12 – 2020-07-13 (×2): 450 mg via ORAL
  Filled 2020-07-11 (×2): qty 3

## 2020-07-11 MED ORDER — ACETAMINOPHEN 325 MG PO TABS
650.0000 mg | ORAL_TABLET | Freq: Four times a day (QID) | ORAL | Status: DC | PRN
  Administered 2020-07-14: 21:00:00 650 mg via ORAL
  Filled 2020-07-11: qty 2

## 2020-07-11 MED ORDER — RIVAROXABAN 20 MG PO TABS
20.0000 mg | ORAL_TABLET | Freq: Every evening | ORAL | Status: DC
  Administered 2020-07-11 – 2020-07-12 (×2): 20 mg via ORAL
  Filled 2020-07-11 (×3): qty 1

## 2020-07-11 MED ORDER — ATORVASTATIN CALCIUM 10 MG PO TABS
10.0000 mg | ORAL_TABLET | Freq: Every day | ORAL | Status: DC
  Administered 2020-07-11 – 2020-07-18 (×8): 10 mg via ORAL
  Filled 2020-07-11 (×8): qty 1

## 2020-07-11 MED ORDER — CARVEDILOL 12.5 MG PO TABS
12.5000 mg | ORAL_TABLET | Freq: Two times a day (BID) | ORAL | Status: DC
  Administered 2020-07-11 – 2020-07-13 (×5): 12.5 mg via ORAL
  Filled 2020-07-11 (×5): qty 1

## 2020-07-11 MED ORDER — ALBUTEROL SULFATE HFA 108 (90 BASE) MCG/ACT IN AERS
2.0000 | INHALATION_SPRAY | Freq: Four times a day (QID) | RESPIRATORY_TRACT | Status: DC | PRN
  Filled 2020-07-11: qty 6.7

## 2020-07-11 MED ORDER — MECLIZINE HCL 25 MG PO TABS: 25.0000 mg | ORAL_TABLET | Freq: Every evening | ORAL | Status: DC | PRN

## 2020-07-11 MED ORDER — SERTRALINE HCL 50 MG PO TABS
75.0000 mg | ORAL_TABLET | Freq: Every day | ORAL | Status: DC
  Administered 2020-07-12 – 2020-07-13 (×2): 75 mg via ORAL
  Filled 2020-07-11 (×2): qty 2

## 2020-07-11 MED ORDER — INSULIN ASPART 100 UNIT/ML ~~LOC~~ SOLN: 0.0000 [IU] | Freq: Every day | SUBCUTANEOUS | Status: DC

## 2020-07-11 MED ORDER — POLYETHYLENE GLYCOL 3350 17 G PO PACK: 17.0000 g | PACK | Freq: Every day | ORAL | Status: DC | PRN

## 2020-07-11 NOTE — ED Notes (Addendum)
This RN called [facility] to gain clarification on the reason for pt's return to ED. Per Arlyn Leak, med tech/CNA on pt's floor, pt was unable to stand / bear weight on right leg. Pt fell out of her chair this morning in unwitnessed fall. Unknown if she hit her head, but pt was alert "in a split-like position" when she was found. Pt likely was in that position "for less than 15 minutes." She endorsed nausea at [facility].

## 2020-07-11 NOTE — Progress Notes (Signed)
TOC CM/CSW consulted with doctor, pt is not stable for dc at this time.  CSW will continue to follow for dc needs.  Kazumi Lachney Tarpley-Carter, MSW, LCSW-A Pronouns:  She, Her, Hers                  [facility] ED Transitions of CareClinical Social Worker [email or web address] [phone]

## 2020-07-11 NOTE — ED Triage Notes (Signed)
Pt bib ems from [facility] following unwitnessed fall this AM. Per pt, she slipped in bathroom on a towel. Pt was seen here and d/c'ed earlier today for same. Pt endorses 10/10 right hip pain when she is moved. 0/10 pain at rest. Strong bilateral pedal pulses. Pt uses walker at home.   EMS vitals: 168/p  HR 84 97% on 2L RR 18 CBG 152

## 2020-07-11 NOTE — ED Notes (Signed)
Report given to 3E RN

## 2020-07-11 NOTE — H&P (Signed)
History and Physical    Dawn Morrow ZDG:[id number] DOB: 05-Jul-1936 DOA: 07/11/2020  PCP: System, Provider Not In  Patient coming from: [facility]  Chief Complaint: Falls  HPI: Dawn Morrow is a 84 y.o. female with medical history significant of dementia, frequent falls. Presenting after unwitnessed fall at her ALF. Patient is a poor historian d/t mentation. Hx of EDP report. She had a recent visit to the ED for the same issues a few days ago. Per ED report, she was sent home with HHPT/OT. She was in a session today and apparently she was unable to bear weight on her leg during the session. She was allowed to sit in her wheelchair and was left unattended temporarily. At was at that time that she fell out of her chair. She was found on the floor shortly after. The patient does not remember the fall. The facility became concerned and sent her back to the ED.   ED Course: CTH and right hip were negative for new injury. ALF was unwilling to take patient back. TRH was called for admission.   Review of Systems:  C/o hip pain. Denies CP, dyspnea, N/V. Review of systems is otherwise negative for all not mentioned in HPI.   PMHx Past Medical History:  Diagnosis Date  . Anxiety and depression   . Asthma   . Cataract   . COPD (chronic obstructive pulmonary disease) ([place])   . Diabetes mellitus   . Diverticulosis   . DVT (deep vein thrombosis) in pregnancy    hx x multiple on coumadin  . GERD (gastroesophageal reflux disease)   . Hemorrhoids   . Hiatal hernia   . Hyperlipidemia   . Hypertension   . Hypothyroidism   . On home oxygen therapy    "2L; 24/7" (09/17/2017)  . Osteoarthritis    h/o spinal stenosis by MRI 2009  . Osteopenia   . Pulmonary embolism (HCC)    x 2  . Stroke [facility])    MINI  . Tubular adenoma of colon 07/2005    PSHx Past Surgical History:  Procedure Laterality Date  . APPENDECTOMY    . CATARACT EXTRACTION    . CHOLECYSTECTOMY    . LUMBAR FUSION    .  POLYPECTOMY    . TONSILLECTOMY    . TUBAL LIGATION      SocHx  reports that she has quit smoking. Her smoking use included cigarettes. She has never used smokeless tobacco. She reports that she does not drink alcohol and does not use drugs.  Allergies  Allergen Reactions  . Penicillins Anaphylaxis  . Codeine Other (See Comments)    Listed on MAR  . Penicillins Other (See Comments)    Listed on MAR  . Sulfonylureas Other (See Comments)    Unknown reaction  . Sulfonylureas Other (See Comments)    Listed on MAR  . Codeine Other (See Comments)    Unknown reaction  . Sulfonamide Derivatives Other (See Comments)    Unknown reaction    FamHx Family History  Adopted: Yes  Problem Relation Age of Onset  . Cancer Mother        ? colon or ovarian  . Diabetes Mother   . Cancer Brother        ?  . CAD Neg Hx     Prior to Admission medications   Medication Sig Start Date End Date Taking? Authorizing Provider  acetaminophen (TYLENOL) 500 MG tablet Take 500 mg by mouth every 4 (four) hours  as needed (pain).     [provider]  albuterol (PROVENTIL HFA;VENTOLIN HFA) 108 (90 BASE) MCG/ACT inhaler Inhale 2 puffs into the lungs every 6 (six) hours as needed for wheezing or shortness of breath. 08/05/15   Saguier, Percell Miller, PA-C  atorvastatin (LIPITOR) 10 MG tablet Take 1 tablet (10 mg total) by mouth at bedtime. 10/21/15   Colon Branch, MD  buPROPion HCl ER, XL, 450 MG TB24 Take 450 mg by mouth daily.  11/20/19   [provider]  carvedilol (COREG) 12.5 MG tablet Take 1 tablet (12.5 mg total) by mouth 2 (two) times daily with a meal. 05/06/20   Dessa Phi, DO  docusate sodium (COLACE) 100 MG capsule Take 100 mg by mouth daily.    [provider]  ezetimibe (ZETIA) 10 MG tablet Take 10 mg by mouth daily.    [provider]  gabapentin (NEURONTIN) 300 MG capsule Take 1 cap in AM, 1 cap at noon, 2 caps at bedtime Patient taking differently: Take 300-600 mg  by mouth See admin instructions. Take one capsule (300 mg) by mouth at 9am and 2pm, take two capsules (600 mg) at bedtime 12/21/14   Cameron Sprang, MD  insulin aspart (NOVOLOG FLEXPEN) 100 UNIT/ML FlexPen Inject 0-12 Units into the skin See admin instructions. Inject 0-12 units subcutaneously four times daily - before meals and at bedtime - per sliding scale: CBG 0-150 0 units, 151-200 4 units, 201-250 6 units, 251-300 8 units, 301-350 10 units, 351-400 12 units, >400 12 units    [provider]  insulin glargine (LANTUS) 100 UNIT/ML injection Inject 0.07 mLs (7 Units total) into the skin daily. 05/06/20 09/26/20  Dessa Phi, DO  ketoconazole (NIZORAL) 2 % shampoo Apply 1 application topically See admin instructions. Use topically to shampoo hair twice weekly - Tuesday and Friday    [provider]  levothyroxine (SYNTHROID) 88 MCG tablet Take 88 mcg by mouth daily before breakfast.    [provider]  linagliptin (TRADJENTA) 5 MG TABS tablet Take 1 tablet (5 mg total) by mouth at bedtime. 07/15/16   Colon Branch, MD  LORazepam (ATIVAN) 0.5 MG tablet Take 1 tablet (0.5 mg total) by mouth every 8 (eight) hours as needed for anxiety. 05/06/20   Dessa Phi, DO  meclizine (ANTIVERT) 25 MG tablet Take 25 mg by mouth at bedtime as needed for dizziness.    [provider]  ondansetron (ZOFRAN) 4 MG tablet Take 4 mg by mouth every 8 (eight) hours as needed for nausea or vomiting.    [provider]  OXYGEN Inhale 3 L into the lungs continuous.    [provider]  OXYGEN Inhale 4 L/min into the lungs continuous.    [provider]  polyethylene glycol (MIRALAX / GLYCOLAX) 17 g packet Take 17 g by mouth daily as needed for mild constipation or moderate constipation.    [provider]  Propylene Glycol (SYSTANE BALANCE) 0.6 % SOLN Place 1 drop into both eyes 4 (four) times daily as needed (for dry eyes).     [provider]    repaglinide (PRANDIN) 0.5 MG tablet Take 1 tablet (0.5 mg total) by mouth 3 (three) times daily before meals. Patient taking differently: Take 0.5 mg by mouth 3 (three) times daily before meals. Hold for skipped meals 01/14/16   Colon Branch, MD  rivaroxaban (XARELTO) 20 MG TABS tablet Take 1 tablet (20 mg total) by mouth daily with supper. Patient  taking differently: Take 20 mg by mouth every evening.  09/14/14   Colon Branch, MD  sertraline (ZOLOFT) 50 MG tablet Take 75 mg by mouth daily.  11/20/19   [provider]  clindamycin (CLEOCIN) 300 MG capsule Take 1 capsule (300 mg total) by mouth 3 (three) times daily. Patient not taking: Reported on 07/02/2017 06/17/17 07/02/17  Colon Branch, MD    Physical Exam: Vitals:   07/11/20 1040 07/11/20 1045 07/11/20 1048 07/11/20 1348  BP:  (!) 170/93 (!) 170/93 (!) 165/114  Pulse:  62 84 68  Resp:  14 16 16   Temp: 98.1 F (36.7 C)     TempSrc: Oral     SpO2:  92% 98% 99%    General: 84 y.o. female resting in bed in NAD Eyes: PERRL, normal sclera ENMT: Nares patent w/o discharge, orophaynx clear, dentition normal, ears w/o discharge/lesions/ulcers Neck: Supple, trachea midline Cardiovascular: RRR, +S1, S2, no m/g/r, equal pulses throughout Respiratory: CTABL, no w/r/r, normal WOB GI: BS+, NDNT, no masses noted, no organomegaly noted MSK: No e/c/c; pain to internal rotation of right hip Skin: No rashes, bruises, ulcerations noted Neuro: A&O x 2 (name, year), no focal deficits Psyc: Pleasantly demented, calm/cooperative  Labs on Admission: I have personally reviewed following labs and imaging studies  CBC: Recent Labs  Lab 07/09/20 1641  WBC 8.8  NEUTROABS 6.5  HGB 11.8*  HCT 38.6  MCV 99.5  PLT 347   Basic Metabolic Panel: Recent Labs  Lab 07/09/20 1641  NA 141  K 4.1  CL 102  CO2 30  GLUCOSE 80  BUN 21  CREATININE 1.30*  CALCIUM 8.7*   GFR: CrCl cannot be calculated (Unknown ideal weight.). Liver Function  Tests: Recent Labs  Lab 07/09/20 1641  AST 20  ALT 18  ALKPHOS 84  BILITOT 0.8  PROT 7.0  ALBUMIN 3.5   No results for input(s): LIPASE, AMYLASE in the last 168 hours. No results for input(s): AMMONIA in the last 168 hours. Coagulation Profile: Recent Labs  Lab 07/09/20 1641  INR 1.7*   Cardiac Enzymes: No results for input(s): CKTOTAL, CKMB, CKMBINDEX, TROPONINI in the last 168 hours. BNP (last 3 results) No results for input(s): PROBNP in the last 8760 hours. HbA1C: No results for input(s): HGBA1C in the last 72 hours. CBG: Recent Labs  Lab 07/10/20 1145 07/10/20 1243 07/10/20 1357 07/10/20 1604 07/10/20 2332  GLUCAP 74 99 95 78 121*   Lipid Profile: No results for input(s): CHOL, HDL, LDLCALC, TRIG, CHOLHDL, LDLDIRECT in the last 72 hours. Thyroid Function Tests: No results for input(s): TSH, T4TOTAL, FREET4, T3FREE, THYROIDAB in the last 72 hours. Anemia Panel: No results for input(s): VITAMINB12, FOLATE, FERRITIN, TIBC, IRON, RETICCTPCT in the last 72 hours. Urine analysis:    Component Value Date/Time   COLORURINE YELLOW 07/09/2020 1619   APPEARANCEUR CLEAR 07/09/2020 1619   LABSPEC 1.013 07/09/2020 1619   PHURINE 6.0 07/09/2020 1619   GLUCOSEU NEGATIVE 07/09/2020 1619   GLUCOSEU NEGATIVE 06/12/2014 1410   HGBUR SMALL (A) 07/09/2020 1619   BILIRUBINUR NEGATIVE 07/09/2020 1619   BILIRUBINUR Neg 06/19/2016 1101   KETONESUR NEGATIVE 07/09/2020 1619   PROTEINUR NEGATIVE 07/09/2020 1619   UROBILINOGEN >=8.0 06/19/2016 1101   UROBILINOGEN 0.2 05/31/2015 1835   NITRITE NEGATIVE 07/09/2020 1619   LEUKOCYTESUR TRACE (A) 07/09/2020 1619    Radiological Exams on Admission: DG Chest 1 View  Result Date: 07/09/2020 CLINICAL DATA:  Fall. Additional history provided: Former smoker, COPD, hypertension, history  of tubular adenoma of colon 2006, history of asthma. EXAM: CHEST  1 VIEW COMPARISON:  Prior chest radiographs 04/27/2020. FINDINGS: Shallow inspiration  radiograph. Cardiomegaly. Aortic atherosclerosis. Chronic prominence of the interstitial lung markings. No appreciable airspace consolidation. No evidence of pleural effusion or pneumothorax. No acute bony abnormality identified. Partially imaged metallic foreign body projecting over the upper lumbar spine, appearing most suggestive of an IVC filter. IMPRESSION: Shallow inspiration radiograph. No evidence of acute cardiopulmonary abnormality. Chronic prominence of the interstitial lung markings. Unchanged cardiomegaly. Aortic Atherosclerosis (ICD10-I70.0). Electronically Signed   By: Kellie Simmering DO   On: 07/09/2020 17:43   CT Head Wo Contrast  Result Date: 07/11/2020 CLINICAL DATA:  Unwitnessed fall. EXAM: CT HEAD WITHOUT CONTRAST TECHNIQUE: Contiguous axial images were obtained from the base of the skull through the vertex without intravenous contrast. COMPARISON:  July 09, 2020. FINDINGS: Brain: Mild chronic ischemic white matter disease is noted. No mass effect or midline shift is noted. Ventricular size is within normal limits. There is no evidence of mass lesion, hemorrhage or acute infarction. Vascular: No hyperdense vessel or unexpected calcification. Skull: Normal. Negative for fracture or focal lesion. Sinuses/Orbits: No acute finding. Other: None. IMPRESSION: Mild chronic ischemic white matter disease. No acute intracranial abnormality seen. Electronically Signed   By: Marijo Conception M.D.   On: 07/11/2020 13:47   CT Head Wo Contrast  Result Date: 07/09/2020 CLINICAL DATA:  [place] EXAM: CT HEAD WITHOUT CONTRAST TECHNIQUE: Contiguous axial images were obtained from the base of the skull through the vertex without intravenous contrast. COMPARISON:  04/27/2020 FINDINGS: Brain: Chronic hypodensities within the periventricular white matter and bilateral basal ganglia are stable, consistent with chronic small vessel ischemic changes. No acute infarct or hemorrhage. Lateral ventricles and remaining  midline structures are unremarkable. No acute extra-axial fluid collections. No mass effect. Vascular: No hyperdense vessel or unexpected calcification. Skull: Normal. Negative for fracture or focal lesion. Minimal residual left frontal scalp hematoma, markedly decreased since previous exam. Sinuses/Orbits: No acute finding. Other: None. IMPRESSION: 1. No acute intracranial process. 2. Stable chronic small-vessel ischemic changes throughout the white matter and bilateral basal ganglia. Electronically Signed   By: Randa Ngo M.D.   On: 07/09/2020 17:37   CT Hip Right Wo Contrast  Result Date: 07/11/2020 CLINICAL DATA:  Right hip pain after fall. EXAM: CT OF THE RIGHT HIP WITHOUT CONTRAST TECHNIQUE: Multidetector CT imaging of the right hip was performed according to the standard protocol. Multiplanar CT image reconstructions were also generated. COMPARISON:  CT right hip dated July 09, 2020. FINDINGS: Bones/Joint/Cartilage No fracture or dislocation. Unchanged mild right hip osteoarthritis. No joint effusion. Ligaments Ligaments are suboptimally evaluated by CT. Muscles and Tendons Grossly intact. Soft tissue No fluid collection or hematoma. No soft tissue mass. Sigmoid colonic diverticulosis. IMPRESSION: 1. No acute osseous abnormality. Electronically Signed   By: Titus Dubin M.D.   On: 07/11/2020 13:40   CT Hip Right Wo Contrast  Result Date: 07/09/2020 CLINICAL DATA:  Fall in the way to the bathroom. Right hip/leg pain. EXAM: CT OF THE RIGHT HIP WITHOUT CONTRAST TECHNIQUE: Multidetector CT imaging of the right hip was performed according to the standard protocol. Multiplanar CT image reconstructions were also generated. COMPARISON:  Radiograph earlier today. FINDINGS: Bones/Joint/Cartilage No acute fracture. No dislocation. Right hip osteoarthritis with joint space narrowing and acetabular spurring. No avascular necrosis. No focal bone lesion. Degenerative change of the pubic symphysis with  osteophytes. No significant hip joint effusion. Ligaments Suboptimally assessed by  CT. Muscles and Tendons No intramuscular hematoma.  Muscle bulk is maintained. Soft tissues Mild skin thickening and soft tissue edema laterally. No confluent soft tissue hematoma. Diverticulosis in the sigmoid colon partially included. IMPRESSION: 1. No acute fracture or dislocation of the right hip. 2. Right hip osteoarthritis. Electronically Signed   By: Keith Rake M.D.   On: 07/09/2020 18:55   DG Hip Unilat With Pelvis 2-3 Views Right  Result Date: 07/11/2020 CLINICAL DATA:  Right hip pain after a fall. EXAM: DG HIP (WITH OR WITHOUT PELVIS) 2-3V RIGHT COMPARISON:  Hip radiographs dated 07/09/2020. FINDINGS: There is no evidence of hip fracture or dislocation. Mild degenerative changes are seen in both hips. IMPRESSION: No acute osseous injury. Electronically Signed   By: Zerita Boers M.D.   On: 07/11/2020 11:56   DG Hip Unilat W or Wo Pelvis 2-3 Views Right  Result Date: 07/09/2020 CLINICAL DATA:  Fall on the way to the bathroom.  Right hip pain. EXAM: DG HIP (WITH OR WITHOUT PELVIS) 2-3V RIGHT COMPARISON:  None. FINDINGS: The cortical margins of the bony pelvis and right hip are intact. No fracture. Pubic symphysis and sacroiliac joints are congruent. Both femoral heads are well-seated in the respective acetabula. Minimal degenerative spurring of the acetabulum. There may be soft tissue edema laterally versus habitus. IMPRESSION: No fracture of the pelvis or right hip. Electronically Signed   By: Keith Rake M.D.   On: 07/09/2020 17:40   DG Femur Min 2 Views Right  Result Date: 07/09/2020 CLINICAL DATA:  Recent fall with right leg pain, initial encounter EXAM: RIGHT FEMUR 2 VIEWS COMPARISON:  None. FINDINGS: Degenerative changes about the knee joint are noted most prominent medially. No acute fracture or dislocation is noted. No joint effusion is seen. No gross soft tissue abnormality is noted. IMPRESSION:  Degenerative change without acute abnormality. Electronically Signed   By: Inez Catalina M.D.   On: 07/09/2020 18:29   Assessment/Plan Falls     - admit to obs, med-surg     - denies CP, palpitations, dyspnea prior to fall     - CT right hip w/o acute fracture, but she complains of hip pain with inversion of right knee     - will ask PT/OT to assess     - multiple falls over last several months; she lives in an ALF, she may need higher level of care for outpt living; will ask TOC to come onboard   Hypothyroidism     - continue levothyroxine  COPD Chronic hypercapnic and hypoxic respiratory failure     - on 3L at baseline; continue     - continue home inhalers  CKD3b     - watch nephrotoxins  HLD     - continue lipitor, zetia  Depression/Anxiety     - continue bupropion, zoloft  DM2 w/ neuropathy     - SSI, DM diet, glucose checks, A1c     - continue neurontin  Hx of PE     - continue xarelto     - given frequent falls, may be prudent to have conversation with POA about RvB of xarelto and possible discontinuation.  HTN     - coreg  DVT prophylaxis: xarelto  Code Status: FULL code per paperwork sent from ALF  Family Communication: None at bedside.  Consults called: None  Admission status: Observation  Status is: Observation  The patient remains OBS appropriate and will d/c before 2 midnights.  Dispo: The patient is from:  ALF              Anticipated d/c is to: SNF              Anticipated d/c date is: 1 day              Patient currently is not medically stable to d/c.  Jonnie Finner DO [facility]  If 7PM-7AM, please contact night-coverage [email or web address]  07/11/2020, 2:51 PM

## 2020-07-11 NOTE — ED Notes (Signed)
[facility] arrived for transport 

## 2020-07-11 NOTE — ED Notes (Signed)
Pt placed on purewick 

## 2020-07-11 NOTE — ED Provider Notes (Signed)
[facility] DEPT Provider Note   CSN: [id number] Arrival date & time: 07/11/20  1028     History Chief Complaint  Patient presents with   Lytle Michaels    Dawn Morrow is a 84 y.o. female.  She is here after a reported unwitnessed fall in her apartment in [place].  She is complaining of 10 out of 10 right hip pain when it is moved.  0 out of 10 at rest.  She does not recall the fall.  She remembers a fall 2 days ago when she was seen here and just left around midnight this morning to go back to [facility] after getting multiple CTs and x-rays.  She denies any other pain other than her right hip.  The history is provided by the patient.  Fall This is a recurrent problem. The problem has not changed since onset.Pertinent negatives include no chest pain, no abdominal pain, no headaches and no shortness of breath. Associated symptoms comments: Rt hip pain. The symptoms are aggravated by twisting and bending. The symptoms are relieved by position. She has tried rest for the symptoms. The treatment provided no relief.       Past Medical History:  Diagnosis Date   Anxiety and depression    Asthma    Cataract    COPD (chronic obstructive pulmonary disease) ([place])    Diabetes mellitus    Diverticulosis    DVT (deep vein thrombosis) in pregnancy    hx x multiple on coumadin   GERD (gastroesophageal reflux disease)    Hemorrhoids    Hiatal hernia    Hyperlipidemia    Hypertension    Hypothyroidism    On home oxygen therapy    "2L; 24/7" (09/17/2017)   Osteoarthritis    h/o spinal stenosis by MRI 2009   Osteopenia    Pulmonary embolism (HCC)    x 2   Stroke [facility])    MINI   Tubular adenoma of colon 07/2005    Patient Active Problem List   Diagnosis Date Noted   Acute metabolic encephalopathy 40/98/1191   AMS (altered mental status) 03/26/2020   History of pulmonary embolism 03/26/2020   History of DVT (deep vein thrombosis)  03/26/2020   Atrial fibrillation, chronic ([place]) 03/26/2020   Sepsis ([place]) 12/14/2019   Acute encephalopathy 12/14/2019   COVID-19 virus infection 10/02/2019   Acute lower UTI 10/02/2019   Acute kidney failure ([place]) 10/01/2019   Syncope 09/18/2017   Near syncope 09/17/2017   Diabetes mellitus type 2 in obese ([place]) 09/17/2017   Atrial fibrillation with RVR ([place]) 09/17/2017   Hypomagnesemia 09/17/2017   CKD (chronic kidney disease), stage III ([place]) 09/17/2017   Forehead contusion    MGUS (monoclonal gammopathy of unknown significance) 08/13/2016   Renal failure 07/09/2015   Follow-up ---------PCP NOTES 05/17/2015   Essential tremor 12/28/2014   Ulnar neuropathy of left upper extremity 12/28/2014   Intractable pain 06/21/2014   Back pain 06/21/2014   Lumbar radiculopathy 06/21/2014   Encounter for therapeutic drug monitoring 03/14/2014   Numbness 10/18/2013   TIA (transient ischemic attack) 09/19/2013   Weakness 09/18/2013   Annual physical exam 05/30/2012   Tremor 01/21/2012   Failure to thrive and poor med compliance  01/21/2012   Pulmonary embolism ([place]) 11/18/2010   DVT (deep venous thrombosis) ([place]) 11/18/2010   Long term current use of anticoagulant 11/18/2010   ABNORMAL ELECTROCARDIOGRAM 11/10/2010   OSTEOARTHRITIS 08/06/2010   DIZZINESS 04/15/2010   Diabetes ([place]) 06/04/2009   ACNE  ROSACEA 11/28/2008   BACK PAIN 05/16/2008   HIP PAIN, RIGHT, CHRONIC 09/15/2007   Hypothyroidism 09/13/2007   Dyslipidemia 09/13/2007   Osteoporosis 07/22/2007   DEPRESSION 09/11/2006   HTN (hypertension) 09/11/2006   ASTHMA 09/11/2006   COPD (chronic obstructive pulmonary disease) ([place]) 09/11/2006   GERD 09/11/2006   Recurrent UTI 09/11/2006   [place] FILTER INSERTION, HX OF 09/11/2006    Past Surgical History:  Procedure Laterality Date   APPENDECTOMY     CATARACT EXTRACTION     CHOLECYSTECTOMY     LUMBAR FUSION      POLYPECTOMY     TONSILLECTOMY     TUBAL LIGATION       OB History   No obstetric history on file.     Family History  Adopted: Yes  Problem Relation Age of Onset   Cancer Mother        ? colon or ovarian   Diabetes Mother    Cancer Brother        ?   CAD Neg Hx     Social History   Tobacco Use   Smoking status: Former Smoker    Types: Cigarettes   Smokeless tobacco: Never Used  Scientific laboratory technician Use: Never used  Substance Use Topics   Alcohol use: No    Comment: socially   Drug use: No    Home Medications Prior to Admission medications   Medication Sig Start Date End Date Taking? Authorizing Provider  acetaminophen (TYLENOL) 500 MG tablet Take 500 mg by mouth every 4 (four) hours as needed (pain).     [provider]  albuterol (PROVENTIL HFA;VENTOLIN HFA) 108 (90 BASE) MCG/ACT inhaler Inhale 2 puffs into the lungs every 6 (six) hours as needed for wheezing or shortness of breath. 08/05/15   Saguier, Percell Miller, PA-C  atorvastatin (LIPITOR) 10 MG tablet Take 1 tablet (10 mg total) by mouth at bedtime. 10/21/15   Colon Branch, MD  buPROPion HCl ER, XL, 450 MG TB24 Take 450 mg by mouth daily.  11/20/19   [provider]  carvedilol (COREG) 12.5 MG tablet Take 1 tablet (12.5 mg total) by mouth 2 (two) times daily with a meal. 05/06/20   Dessa Phi, DO  docusate sodium (COLACE) 100 MG capsule Take 100 mg by mouth daily.    [provider]  ezetimibe (ZETIA) 10 MG tablet Take 10 mg by mouth daily.    [provider]  gabapentin (NEURONTIN) 300 MG capsule Take 1 cap in AM, 1 cap at noon, 2 caps at bedtime Patient taking differently: Take 300-600 mg by mouth See admin instructions. Take one capsule (300 mg) by mouth at 9am and 2pm, take two capsules (600 mg) at bedtime 12/21/14   Cameron Sprang, MD  insulin aspart (NOVOLOG FLEXPEN) 100 UNIT/ML FlexPen Inject 0-12 Units into the skin See admin instructions. Inject 0-12 units  subcutaneously four times daily - before meals and at bedtime - per sliding scale: CBG 0-150 0 units, 151-200 4 units, 201-250 6 units, 251-300 8 units, 301-350 10 units, 351-400 12 units, >400 12 units    [provider]  insulin glargine (LANTUS) 100 UNIT/ML injection Inject 0.07 mLs (7 Units total) into the skin daily. 05/06/20 09/26/20  Dessa Phi, DO  ketoconazole (NIZORAL) 2 % shampoo Apply 1 application topically See admin instructions. Use topically to shampoo hair twice weekly - Tuesday and Friday    [provider]  levothyroxine (SYNTHROID) 88 MCG tablet Take 88  mcg by mouth daily before breakfast.    [provider]  linagliptin (TRADJENTA) 5 MG TABS tablet Take 1 tablet (5 mg total) by mouth at bedtime. 07/15/16   Colon Branch, MD  LORazepam (ATIVAN) 0.5 MG tablet Take 1 tablet (0.5 mg total) by mouth every 8 (eight) hours as needed for anxiety. 05/06/20   Dessa Phi, DO  meclizine (ANTIVERT) 25 MG tablet Take 25 mg by mouth at bedtime as needed for dizziness.    [provider]  ondansetron (ZOFRAN) 4 MG tablet Take 4 mg by mouth every 8 (eight) hours as needed for nausea or vomiting.    [provider]  OXYGEN Inhale 3 L into the lungs continuous.    [provider]  OXYGEN Inhale 4 L/min into the lungs continuous.    [provider]  polyethylene glycol (MIRALAX / GLYCOLAX) 17 g packet Take 17 g by mouth daily as needed for mild constipation or moderate constipation.    [provider]  Propylene Glycol (SYSTANE BALANCE) 0.6 % SOLN Place 1 drop into both eyes 4 (four) times daily as needed (for dry eyes).     [provider]  repaglinide (PRANDIN) 0.5 MG tablet Take 1 tablet (0.5 mg total) by mouth 3 (three) times daily before meals. Patient taking differently: Take 0.5 mg by mouth 3 (three) times daily before meals. Hold for skipped meals 01/14/16   Colon Branch, MD  rivaroxaban (XARELTO) 20 MG TABS tablet  Take 1 tablet (20 mg total) by mouth daily with supper. Patient taking differently: Take 20 mg by mouth every evening.  09/14/14   Colon Branch, MD  sertraline (ZOLOFT) 50 MG tablet Take 75 mg by mouth daily.  11/20/19   [provider]  clindamycin (CLEOCIN) 300 MG capsule Take 1 capsule (300 mg total) by mouth 3 (three) times daily. Patient not taking: Reported on 07/02/2017 06/17/17 07/02/17  Colon Branch, MD    Allergies    Penicillins, Codeine, Penicillins, Sulfonylureas, Sulfonylureas, Codeine, and Sulfonamide derivatives  Review of Systems   Review of Systems  Constitutional: Negative for fever.  HENT: Negative for sore throat.   Eyes: Negative for visual disturbance.  Respiratory: Negative for shortness of breath.   Cardiovascular: Negative for chest pain.  Gastrointestinal: Negative for abdominal pain.  Genitourinary: Negative for dysuria.  Musculoskeletal: Negative for neck pain.  Skin: Negative for rash.  Neurological: Negative for headaches.    Physical Exam Updated Vital Signs BP (!) 170/93 (BP Location: Left Arm)    Pulse 84    Temp 98.1 F (36.7 C) (Oral)    Resp 16    SpO2 98%   Physical Exam Vitals and nursing note reviewed.  Constitutional:      General: She is not in acute distress.    Appearance: Normal appearance. She is well-developed.  HENT:     Head: Normocephalic and atraumatic.  Eyes:     Conjunctiva/sclera: Conjunctivae normal.  Cardiovascular:     Rate and Rhythm: Normal rate and regular rhythm.     Heart sounds: No murmur heard.   Pulmonary:     Effort: Pulmonary effort is normal. No respiratory distress.     Breath sounds: Normal breath sounds.  Abdominal:     Palpations: Abdomen is soft.     Tenderness: There is no abdominal tenderness.  Musculoskeletal:        General: Tenderness present. No deformity.     Cervical back: Neck supple.  Comments: She has full range of motion of her upper extremities without any pain or  limitations.  Full range of motion of her left lower extremity without any pain or limitations.  Right lower extremity no abnormal shortening or rotation.  Nontender ankle and knee.  When attempting to internally and externally rotate the leg she has pain in her hip.  Distal pulses intact.  Skin:    General: Skin is warm and dry.  Neurological:     General: No focal deficit present.     Mental Status: She is alert. Mental status is at baseline.     ED Results / Procedures / Treatments   Labs (all labs ordered are listed, but only abnormal results are displayed) Labs Reviewed  CBC WITH DIFFERENTIAL/PLATELET - Abnormal; Notable for the following components:      Result Value   WBC 11.6 (*)    RDW 16.4 (*)    Neutro Abs 9.3 (*)    Abs Immature Granulocytes 0.15 (*)    All other components within normal limits  BASIC METABOLIC PANEL - Abnormal; Notable for the following components:   Glucose, Bld 115 (*)    Creatinine, Ser 1.08 (*)    GFR, Estimated 51 (*)    All other components within normal limits  COMPREHENSIVE METABOLIC PANEL - Abnormal; Notable for the following components:   Glucose, Bld 103 (*)    Creatinine, Ser 1.34 (*)    GFR, Estimated 40 (*)    All other components within normal limits  CBC - Abnormal; Notable for the following components:   RDW 16.7 (*)    All other components within normal limits  GLUCOSE, CAPILLARY - Abnormal; Notable for the following components:   Glucose-Capillary 119 (*)    All other components within normal limits  RESPIRATORY PANEL BY RT PCR (FLU A&B, COVID)  MRSA PCR SCREENING  HEMOGLOBIN A1C  GLUCOSE, CAPILLARY  GLUCOSE, CAPILLARY    EKG None  Radiology CT Head Wo Contrast  Result Date: 07/11/2020 CLINICAL DATA:  Unwitnessed fall. EXAM: CT HEAD WITHOUT CONTRAST TECHNIQUE: Contiguous axial images were obtained from the base of the skull through the vertex without intravenous contrast. COMPARISON:  July 09, 2020. FINDINGS: Brain:  Mild chronic ischemic white matter disease is noted. No mass effect or midline shift is noted. Ventricular size is within normal limits. There is no evidence of mass lesion, hemorrhage or acute infarction. Vascular: No hyperdense vessel or unexpected calcification. Skull: Normal. Negative for fracture or focal lesion. Sinuses/Orbits: No acute finding. Other: None. IMPRESSION: Mild chronic ischemic white matter disease. No acute intracranial abnormality seen. Electronically Signed   By: Marijo Conception M.D.   On: 07/11/2020 13:47   CT Hip Right Wo Contrast  Result Date: 07/11/2020 CLINICAL DATA:  Right hip pain after fall. EXAM: CT OF THE RIGHT HIP WITHOUT CONTRAST TECHNIQUE: Multidetector CT imaging of the right hip was performed according to the standard protocol. Multiplanar CT image reconstructions were also generated. COMPARISON:  CT right hip dated July 09, 2020. FINDINGS: Bones/Joint/Cartilage No fracture or dislocation. Unchanged mild right hip osteoarthritis. No joint effusion. Ligaments Ligaments are suboptimally evaluated by CT. Muscles and Tendons Grossly intact. Soft tissue No fluid collection or hematoma. No soft tissue mass. Sigmoid colonic diverticulosis. IMPRESSION: 1. No acute osseous abnormality. Electronically Signed   By: Titus Dubin M.D.   On: 07/11/2020 13:40   DG Hip Unilat With Pelvis 2-3 Views Right  Result Date: 07/11/2020 CLINICAL DATA:  Right hip pain  after a fall. EXAM: DG HIP (WITH OR WITHOUT PELVIS) 2-3V RIGHT COMPARISON:  Hip radiographs dated 07/09/2020. FINDINGS: There is no evidence of hip fracture or dislocation. Mild degenerative changes are seen in both hips. IMPRESSION: No acute osseous injury. Electronically Signed   By: Zerita Boers M.D.   On: 07/11/2020 11:56    Procedures Procedures (including critical care time)  Medications Ordered in ED Medications  atorvastatin (LIPITOR) tablet 10 mg (10 mg Oral Given 07/11/20 2207)  carvedilol (COREG) tablet  12.5 mg (12.5 mg Oral Given 07/12/20 0821)  ezetimibe (ZETIA) tablet 10 mg (10 mg Oral Given 07/12/20 0821)  buPROPion (WELLBUTRIN XL) 24 hr tablet 450 mg (450 mg Oral Given 07/12/20 0819)  LORazepam (ATIVAN) tablet 0.5 mg (has no administration in time range)  sertraline (ZOLOFT) tablet 75 mg (75 mg Oral Given 07/12/20 0821)  levothyroxine (SYNTHROID) tablet 88 mcg (88 mcg Oral Given 07/12/20 0525)  docusate sodium (COLACE) capsule 100 mg (100 mg Oral Given 07/12/20 0820)  meclizine (ANTIVERT) tablet 25 mg (has no administration in time range)  polyethylene glycol (MIRALAX / GLYCOLAX) packet 17 g (has no administration in time range)  rivaroxaban (XARELTO) tablet 20 mg (20 mg Oral Given 07/11/20 1821)  albuterol (VENTOLIN HFA) 108 (90 Base) MCG/ACT inhaler 2 puff (has no administration in time range)  acetaminophen (TYLENOL) tablet 650 mg (has no administration in time range)    Or  acetaminophen (TYLENOL) suppository 650 mg (has no administration in time range)  ondansetron (ZOFRAN) tablet 4 mg (has no administration in time range)    Or  ondansetron (ZOFRAN) injection 4 mg (has no administration in time range)  insulin aspart (novoLOG) injection 0-15 Units (0 Units Subcutaneous Not Given 07/12/20 0809)  insulin aspart (novoLOG) injection 0-5 Units (0 Units Subcutaneous Not Given 07/11/20 2113)  MEDLINE mouth rinse (15 mLs Mouth Rinse Given 07/12/20 2878)    ED Course  I have reviewed the triage vital signs and the nursing notes.  Pertinent labs & imaging results that were available during my care of the patient were reviewed by me and considered in my medical decision making (see chart for details).  Clinical Course as of Jul 12 950  Thu Jul 11, 2020  1201 Patient's nurse was able to call her facility and find out what happened.  She has been unable to weight-bear on the right leg and she was in a wheelchair.  They left her alone for a minute and found her on the floor again.  She likely had  fallen.  No reported signs of injury.   [MB]  6767 Discussed with Dr. Marylyn Ishihara from [facility] hospitalist who will evaluate the patient for admission.   [MB]    Clinical Course User Index [MB] Hayden Rasmussen, MD   MDM Rules/Calculators/A&P                         This patient complains of right hip pain; this involves an extensive number of treatment Options and is a complaint that carries with it a high risk of complications and Morbidity. The differential includes fracture, dislocation, contusion, radiculopathy.  It is unclear whether this hip pain continues from her last ED visit or whether there is a new component to it  I ordered, reviewed and interpreted labs, which included CBC with mildly elevated white count, stable hemoglobin, chemistries fairly normal mild elevation of glucose and creatinine, Covid testing negative I ordered imaging studies which included right hip  and pelvis, CT head, CT right hip and I independently    visualized and interpreted imaging which showed no acute findings Additional history obtained from EMS, nursing home Previous records obtained and reviewed in epic including recent ED visit and discharge just  hours ago from ED I consulted [facility] hospitalist Dr. Marylyn Ishihara and discussed lab and imaging findings  Critical Interventions: None  After the interventions stated above, I reevaluated the patient and found patient to have significant pain with any type of manipulation of her right hip.  Advanced imaging negative.  As she was already sent home once to her nursing home with this complaint and is following feel she may benefit from coming into the hospital for more aggressive management.   Final Clinical Impression(s) / ED Diagnoses Final diagnoses:  Fall, initial encounter  Right hip pain    Rx / DC Orders ED Discharge Orders    None       Hayden Rasmussen, MD 07/12/20 [phone]

## 2020-07-11 NOTE — ED Notes (Signed)
Pt transported to xray 

## 2020-07-12 DIAGNOSIS — E1122 Type 2 diabetes mellitus with diabetic chronic kidney disease: Secondary | ICD-10-CM | POA: Diagnosis present

## 2020-07-12 DIAGNOSIS — E669 Obesity, unspecified: Secondary | ICD-10-CM | POA: Diagnosis present

## 2020-07-12 DIAGNOSIS — D6859 Other primary thrombophilia: Secondary | ICD-10-CM | POA: Diagnosis present

## 2020-07-12 DIAGNOSIS — J449 Chronic obstructive pulmonary disease, unspecified: Secondary | ICD-10-CM | POA: Diagnosis present

## 2020-07-12 DIAGNOSIS — I1310 Hypertensive heart and chronic kidney disease without heart failure, with stage 1 through stage 4 chronic kidney disease, or unspecified chronic kidney disease: Secondary | ICD-10-CM | POA: Diagnosis present

## 2020-07-12 DIAGNOSIS — I482 Chronic atrial fibrillation, unspecified: Secondary | ICD-10-CM | POA: Diagnosis not present

## 2020-07-12 DIAGNOSIS — E860 Dehydration: Secondary | ICD-10-CM | POA: Diagnosis present

## 2020-07-12 DIAGNOSIS — R4182 Altered mental status, unspecified: Secondary | ICD-10-CM | POA: Diagnosis not present

## 2020-07-12 DIAGNOSIS — Z7189 Other specified counseling: Secondary | ICD-10-CM | POA: Diagnosis not present

## 2020-07-12 DIAGNOSIS — F419 Anxiety disorder, unspecified: Secondary | ICD-10-CM | POA: Diagnosis present

## 2020-07-12 DIAGNOSIS — N179 Acute kidney failure, unspecified: Secondary | ICD-10-CM | POA: Diagnosis not present

## 2020-07-12 DIAGNOSIS — Z515 Encounter for palliative care: Secondary | ICD-10-CM | POA: Diagnosis not present

## 2020-07-12 DIAGNOSIS — Z87440 Personal history of urinary (tract) infections: Secondary | ICD-10-CM | POA: Diagnosis not present

## 2020-07-12 DIAGNOSIS — I10 Essential (primary) hypertension: Secondary | ICD-10-CM | POA: Diagnosis not present

## 2020-07-12 DIAGNOSIS — F0390 Unspecified dementia without behavioral disturbance: Secondary | ICD-10-CM | POA: Diagnosis present

## 2020-07-12 DIAGNOSIS — E440 Moderate protein-calorie malnutrition: Secondary | ICD-10-CM | POA: Diagnosis present

## 2020-07-12 DIAGNOSIS — Z86711 Personal history of pulmonary embolism: Secondary | ICD-10-CM | POA: Diagnosis not present

## 2020-07-12 DIAGNOSIS — Z20822 Contact with and (suspected) exposure to covid-19: Secondary | ICD-10-CM | POA: Diagnosis present

## 2020-07-12 DIAGNOSIS — Z6830 Body mass index (BMI) 30.0-30.9, adult: Secondary | ICD-10-CM | POA: Diagnosis not present

## 2020-07-12 DIAGNOSIS — I4819 Other persistent atrial fibrillation: Secondary | ICD-10-CM | POA: Diagnosis present

## 2020-07-12 DIAGNOSIS — E785 Hyperlipidemia, unspecified: Secondary | ICD-10-CM | POA: Diagnosis present

## 2020-07-12 DIAGNOSIS — E039 Hypothyroidism, unspecified: Secondary | ICD-10-CM | POA: Diagnosis present

## 2020-07-12 DIAGNOSIS — F4321 Adjustment disorder with depressed mood: Secondary | ICD-10-CM | POA: Diagnosis present

## 2020-07-12 DIAGNOSIS — W07XXXA Fall from chair, initial encounter: Secondary | ICD-10-CM | POA: Diagnosis present

## 2020-07-12 DIAGNOSIS — M25551 Pain in right hip: Secondary | ICD-10-CM | POA: Diagnosis not present

## 2020-07-12 DIAGNOSIS — Y92099 Unspecified place in other non-institutional residence as the place of occurrence of the external cause: Secondary | ICD-10-CM | POA: Diagnosis not present

## 2020-07-12 DIAGNOSIS — R531 Weakness: Secondary | ICD-10-CM | POA: Diagnosis not present

## 2020-07-12 DIAGNOSIS — J9612 Chronic respiratory failure with hypercapnia: Secondary | ICD-10-CM | POA: Diagnosis present

## 2020-07-12 DIAGNOSIS — J9611 Chronic respiratory failure with hypoxia: Secondary | ICD-10-CM | POA: Diagnosis present

## 2020-07-12 DIAGNOSIS — Z7901 Long term (current) use of anticoagulants: Secondary | ICD-10-CM | POA: Diagnosis not present

## 2020-07-12 DIAGNOSIS — Z66 Do not resuscitate: Secondary | ICD-10-CM | POA: Diagnosis not present

## 2020-07-12 DIAGNOSIS — D72829 Elevated white blood cell count, unspecified: Secondary | ICD-10-CM | POA: Diagnosis present

## 2020-07-12 DIAGNOSIS — N1832 Chronic kidney disease, stage 3b: Secondary | ICD-10-CM | POA: Diagnosis present

## 2020-07-12 DIAGNOSIS — W19XXXA Unspecified fall, initial encounter: Secondary | ICD-10-CM | POA: Diagnosis not present

## 2020-07-12 LAB — CBC
HCT: 41.2 % (ref 36.0–46.0)
Hemoglobin: 12.6 g/dL (ref 12.0–15.0)
MCH: 29.9 pg (ref 26.0–34.0)
MCHC: 30.6 g/dL (ref 30.0–36.0)
MCV: 97.9 fL (ref 80.0–100.0)
Platelets: 174 10*3/uL (ref 150–400)
RBC: 4.21 MIL/uL (ref 3.87–5.11)
RDW: 16.7 % — ABNORMAL HIGH (ref 11.5–15.5)
WBC: 9.1 10*3/uL (ref 4.0–10.5)
nRBC: 0 % (ref 0.0–0.2)

## 2020-07-12 LAB — COMPREHENSIVE METABOLIC PANEL
ALT: 15 U/L (ref 0–44)
AST: 16 U/L (ref 15–41)
Albumin: 3.7 g/dL (ref 3.5–5.0)
Alkaline Phosphatase: 72 U/L (ref 38–126)
Anion gap: 9 (ref 5–15)
BUN: 23 mg/dL (ref 8–23)
CO2: 30 mmol/L (ref 22–32)
Calcium: 9.1 mg/dL (ref 8.9–10.3)
Chloride: 101 mmol/L (ref 98–111)
Creatinine, Ser: 1.34 mg/dL — ABNORMAL HIGH (ref 0.44–1.00)
GFR, Estimated: 40 mL/min — ABNORMAL LOW (ref >60)
Glucose, Bld: 103 mg/dL — ABNORMAL HIGH (ref 70–99)
Potassium: 4.1 mmol/L (ref 3.5–5.1)
Sodium: 140 mmol/L (ref 135–145)
Total Bilirubin: 0.9 mg/dL (ref 0.3–1.2)
Total Protein: 7.1 g/dL (ref 6.5–8.1)

## 2020-07-12 LAB — GLUCOSE, CAPILLARY
Glucose-Capillary: 96 mg/dL (ref 70–99)
Glucose-Capillary: 122 mg/dL — ABNORMAL HIGH (ref 70–99)
Glucose-Capillary: 100 mg/dL — ABNORMAL HIGH (ref 70–99)
Glucose-Capillary: 93 mg/dL (ref 70–99)

## 2020-07-12 MED ORDER — OXYCODONE-ACETAMINOPHEN 5-325 MG PO TABS
1.0000 | ORAL_TABLET | Freq: Four times a day (QID) | ORAL | Status: DC | PRN
  Administered 2020-07-12: 11:00:00 1 via ORAL
  Filled 2020-07-12: qty 1

## 2020-07-12 MED ORDER — SODIUM CHLORIDE 0.9 % IV SOLN: INTRAVENOUS | Status: DC

## 2020-07-12 NOTE — TOC Progression Note (Signed)
Transition of Care [facility] - Progression Note    Patient Details  Name: Dawn Morrow MRN: [id number] Date of Birth: 08/18/1936  Transition of Care [facility]) CM/SW [place], LCSW Phone Number: 07/12/2020, 1:01 PM  Clinical Narrative:    [facility] will accept the patient when medically stable. Anticipate discharge 11/6. CSW notified the patient niece [name].     Expected Discharge Plan: Skilled Nursing Facility Barriers to Discharge: Continued Medical Work up  Expected Discharge Plan and Services Expected Discharge Plan: [place] In-house Referral: Clinical Social Work Discharge Planning Services: CM Consult   Living arrangements for the past 2 months: [place]                                       Social Determinants of Health (SDOH) Interventions    Readmission Risk Interventions Readmission Risk Prevention Plan 10/03/2019  Transportation Screening Complete  PCP or Specialist Appt within 3-5 Days Complete  HRI or [place] Not Complete  HRI or Home Care Consult comments Patient at her baseline  Social Work Consult for [place] Planning/Counseling [place] Not Applicable  Medication Review Press photographer) Referral to Pharmacy  Some recent data might be hidden

## 2020-07-12 NOTE — Plan of Care (Signed)
°  Problem: Coping: °Goal: Level of anxiety will decrease °Outcome: Progressing °  °

## 2020-07-12 NOTE — TOC Initial Note (Addendum)
Transition of Care [facility]) - Initial/Assessment Note    Patient Details  Name: Dawn Morrow MRN: [id number] Date of Birth: 25-Dec-1935  Transition of Care [facility]) CM/SW Contact:    Lia Hopping, [place] Phone Number: 07/12/2020, 10:29 AM  Clinical Narrative:    Re: Patient admitted from [facility]F after a unwitnessed fall. Patient has hx of dementia. CSW reached out to family.              CSW reached out to the patient nephew Wille Glaser, introduced role and reason for call. Pt nephew is agreeable to SNF placement for short rehab. He prefers the patient go to [facility] if a bed is available. He reports that he is on his way out of town and request staff follow up with the patient niece [name] for further needs.  CSW left a confidential voicemail for niece.  FL2 complete, made referral to [facility] at [facility]. Information under review.    Addendum-CSW discussed LTC SNF placement with niece and answered  questions.     Barrier discharge today-Continued medical work up  Expected Discharge Plan: [place] Barriers to Discharge: Continued Medical Work up   Patient Goals and CMS Choice Patient states their goals for this hospitalization and ongoing recovery are:: go to rehab, prefer [facility] CMS Medicare.gov Compare Post Acute Care list provided to::  (Niece [name]/Nephew Joe) Choice offered to / list presented to :  (Niece [name]/Joe)  Expected Discharge Plan and Services Expected Discharge Plan: [place] In-house Referral: Clinical Social Work Discharge Planning Services: CM Consult   Living arrangements for the past 2 months: [place]                                      Prior Living Arrangements/Services Living arrangements for the past 2 months: [place] Lives with:: Facility Resident Patient language and need for interpreter reviewed:: No Do you feel safe going back to the place where you live?: Yes       Need for Family Participation in Patient Care: Yes (Comment) Care giver support system in place?: Yes (comment)   Criminal Activity/Legal Involvement Pertinent to Current Situation/Hospitalization: No - Comment as needed  Activities of Daily Living Home Assistive Devices/Equipment: Blood pressure cuff, Eyeglasses, Grab bars around toilet, Grab bars in shower, Hand-held shower hose, Hospital bed, Nebulizer, Oxygen, Wheelchair, Environmental consultant (specify type), Scales, CBG Meter (4wheeled walker, [facility] has necessary equipment for their patients) ADL Screening (condition at time of admission) Patient's cognitive ability adequate to safely complete daily activities?: No Is the patient deaf or have difficulty hearing?: Yes ([place]) Does the patient have difficulty seeing, even when wearing glasses/contacts?: No Does the patient have difficulty concentrating, remembering, or making decisions?: Yes Patient able to express need for assistance with ADLs?: No Does the patient have difficulty dressing or bathing?: Yes Independently performs ADLs?: No (patient very lethargic and weak) Communication: Independent Dressing (OT): Dependent Is this a change from baseline?: Change from baseline, expected to last >3 days Grooming: Dependent Is this a change from baseline?: Change from baseline, expected to last >3 days Feeding: Dependent Is this a change from baseline?: Change from baseline, expected to last >3 days Bathing: Dependent Is this a change from baseline?: Change from baseline, expected to last >3 days Toileting: Dependent Is this a change from baseline?: Change from baseline, expected to last >3days In/Out Bed: Dependent Is this  a change from baseline?: Change from baseline, expected to last >3 days Walks in Home: Dependent Is this a change from baseline?: Change from baseline, expected to last >3 days Does the patient have difficulty walking or climbing stairs?: Yes (does not do  stairs) Weakness of Legs: Both Weakness of Arms/Hands: Both  Permission Sought/Granted Permission sought to share information with : Facility Sport and exercise psychologist, Family Supports Permission granted to share information with : Yes, Verbal Permission Granted  Share Information with NAME: Julianne Rice   [phone], [place],[name] Niece   [phone]  Permission granted to share info w AGENCY: SNF's in the area  Permission granted to share info w Relationship: Nephew/Niece     Emotional Assessment Appearance:: Appears stated age Attitude/Demeanor/Rapport: Unable to Assess Affect (typically observed): Unable to Assess Orientation: : Oriented to Self Alcohol / Substance Use: Not Applicable Psych Involvement: No (comment)  Admission diagnosis:  Fall [W19.XXXA] Patient Active Problem List   Diagnosis Date Noted  . Fall 07/11/2020  . Acute metabolic encephalopathy 90/24/0973  . AMS (altered mental status) 03/26/2020  . History of pulmonary embolism 03/26/2020  . History of DVT (deep vein thrombosis) 03/26/2020  . Atrial fibrillation, chronic ([place]) 03/26/2020  . Sepsis ([place]) 12/14/2019  . Acute encephalopathy 12/14/2019  . COVID-19 virus infection 10/02/2019  . Acute lower UTI 10/02/2019  . Acute kidney failure ([place]) 10/01/2019  . Syncope 09/18/2017  . Near syncope 09/17/2017  . Diabetes mellitus type 2 in obese ([place]) 09/17/2017  . Atrial fibrillation with RVR ([place]) 09/17/2017  . Hypomagnesemia 09/17/2017  . CKD (chronic kidney disease), stage III ([place]) 09/17/2017  . Forehead contusion   . MGUS (monoclonal gammopathy of unknown significance) 08/13/2016  . Renal failure 07/09/2015  . Follow-up ---------PCP NOTES 05/17/2015  . Essential tremor 12/28/2014  . Ulnar neuropathy of left upper extremity 12/28/2014  . Intractable pain 06/21/2014  . Back pain 06/21/2014  . Lumbar radiculopathy 06/21/2014  . Encounter for therapeutic drug monitoring 03/14/2014  .  Numbness 10/18/2013  . TIA (transient ischemic attack) 09/19/2013  . Weakness 09/18/2013  . Annual physical exam 05/30/2012  . Tremor 01/21/2012  . Failure to thrive and poor med compliance  01/21/2012  . Pulmonary embolism ([place]) 11/18/2010  . DVT (deep venous thrombosis) ([place]) 11/18/2010  . Long term current use of anticoagulant 11/18/2010  . ABNORMAL ELECTROCARDIOGRAM 11/10/2010  . OSTEOARTHRITIS 08/06/2010  . DIZZINESS 04/15/2010  . Diabetes ([place]) 06/04/2009  . ACNE ROSACEA 11/28/2008  . BACK PAIN 05/16/2008  . HIP PAIN, RIGHT, CHRONIC 09/15/2007  . Hypothyroidism 09/13/2007  . Dyslipidemia 09/13/2007  . Osteoporosis 07/22/2007  . DEPRESSION 09/11/2006  . HTN (hypertension) 09/11/2006  . ASTHMA 09/11/2006  . COPD (chronic obstructive pulmonary disease) ([place]) 09/11/2006  . GERD 09/11/2006  . Recurrent UTI 09/11/2006  . [place], HX OF 09/11/2006   PCP:  System, Provider Not In Pharmacy:   [facility], [address] [place] [address] Phone: [phone] Fax: [phone]     Social Determinants of Health (SDOH) Interventions    Readmission Risk Interventions Readmission Risk Prevention Plan 10/03/2019  Transportation Screening Complete  PCP or Specialist Appt within 3-5 Days Complete  HRI or [place] Not Complete  HRI or Home Care Consult comments Patient at her baseline  Social Work Consult for [place] Planning/Counseling [place] Not Applicable  Medication Review Press photographer) Referral to Pharmacy  Some recent data  might be hidden

## 2020-07-12 NOTE — Evaluation (Signed)
Physical Therapy Evaluation Patient Details Name: Dawn Morrow MRN: [id number] DOB: Sep 10, 1935 Today's Date: 07/12/2020   History of Present Illness  Dawn Morrow is a 84 y.o. female with medical history significant of dementia, frequent falls. Presenting after unwitnessed fall at her ALF. Patient is a poor historian d/t mentation. Hx of EDP report. She had a recent visit to the ED for the same issues a few days ago. Per ED report, she was sent home with HHPT/OT. She was in a session today and apparently she was unable to bear weight on her leg during the session. She was allowed to sit in her wheelchair and was left unattended temporarily. At was at that time that she fell out of her chair. She was found on the floor shortly after. The patient does not remember the fall. The facility became concerned and sent her back to the ED.  Clinical Impression  Pt admitted with above diagnosis.  Pt currently with functional limitations due to the deficits listed below (see PT Problem List). Pt will benefit from skilled PT to increase their independence and safety with mobility to allow discharge to the venue listed below.  Pt from ALF and per chart review, and niece, [name], present for session, pt likely not to return to same facility.  Pt appears to need higher level of care. Niece reports concern over pt falling in the bathroom at facility and 3 falls within the last 3 months.  Pt does not appear motivated and does become agitated with requests to mobilize.  [name] reports that pt did not like one facility because "they pushed her too much" and she prefers not to participate.  Uncertain if pt will be willing to participate in rehab at SNF however likely will need higher level of care.     Follow Up Recommendations SNF    Equipment Recommendations  None recommended by PT    Recommendations for Other Services       Precautions / Restrictions Precautions Precautions: Fall      Mobility  Bed  Mobility Overal bed mobility: Needs Assistance Bed Mobility: Supine to Sit;Sit to Supine     Supine to sit: Max assist;+2 for physical assistance Sit to supine: Max assist;+2 for physical assistance   General bed mobility comments: pt initiated however not able to complete due to pain in R leg and back (niece reports chronic); pt premedicated for session, nausea upon sitting EOB    Transfers                 General transfer comment: pt unable due to pain and nausea  Ambulation/Gait                Stairs            Wheelchair Mobility    Modified Rankin (Stroke Patients Only)       Balance Overall balance assessment: History of Falls                                           Pertinent Vitals/Pain Pain Assessment: Faces Faces Pain Scale: Hurts whole lot Pain Location: R leg and back Pain Descriptors / Indicators: Grimacing;Guarding;Moaning Pain Intervention(s): Repositioned;Monitored during session;Premedicated before session    Home Living Family/patient expects to be discharged to:: Assisted living               Home Equipment:  Wheelchair - manual Additional Comments: From [facility], [name], pt's niece, reports pt has not been very ambulatory lately and requires assist for OOB, pt has had 3 falls in the past 3 months    Prior Function Level of Independence: Needs assistance   Gait / Transfers Assistance Needed: From [facility], [name], pt's niece, reports pt has not been very ambulatory lately and requires assist for OOB, pt has had 3 falls in the past 3 months           Hand Dominance        Extremity/Trunk Assessment        Lower Extremity Assessment Lower Extremity Assessment: Generalized weakness;RLE deficits/detail RLE Deficits / Details: pt with increased pain with touch or assist to right LE RLE: Unable to fully assess due to pain       Communication   Communication: HOH  Cognition  Arousal/Alertness: Awake/alert Behavior During Therapy: WFL for tasks assessed/performed Overall Cognitive Status: History of cognitive impairments - at baseline                                        General Comments      Exercises     Assessment/Plan    PT Assessment Patient needs continued PT services  PT Problem List Decreased strength;Decreased activity tolerance;Decreased balance;Decreased knowledge of use of DME;Pain;Decreased mobility       PT Treatment Interventions DME instruction;Gait training;Balance training;Therapeutic exercise;Functional mobility training;Therapeutic activities;Patient/family education    PT Goals (Current goals can be found in the Care Plan section)  Acute Rehab PT Goals PT Goal Formulation: With patient Time For Goal Achievement: 07/26/20 Potential to Achieve Goals: Fair    Frequency Min 2X/week   Barriers to discharge        Co-evaluation PT/OT/SLP Co-Evaluation/Treatment: Yes Reason for Co-Treatment: For patient/therapist safety;Necessary to address cognition/behavior during functional activity;To address functional/ADL transfers PT goals addressed during session: Mobility/safety with mobility OT goals addressed during session: ADL's and self-care       AM-PAC PT "6 Clicks" Mobility  Outcome Measure Help needed turning from your back to your side while in a flat bed without using bedrails?: A Lot Help needed moving from lying on your back to sitting on the side of a flat bed without using bedrails?: A Lot Help needed moving to and from a bed to a chair (including a wheelchair)?: Total Help needed standing up from a chair using your arms (e.g., wheelchair or bedside chair)?: Total Help needed to walk in hospital room?: Total Help needed climbing 3-5 steps with a railing? : Total 6 Click Score: 8    End of Session Equipment Utilized During Treatment: Gait belt Activity Tolerance: Patient tolerated treatment  well Patient left: with call bell/phone within reach;in bed;with bed alarm set Nurse Communication: Mobility status PT Visit Diagnosis: Other abnormalities of gait and mobility (R26.89)    Time: 1240-1307 PT Time Calculation (min) (ACUTE ONLY): 27 min   Charges:   PT Evaluation $PT Eval Moderate Complexity: 1 Mod         Kati PT, DPT Acute Rehabilitation Services Pager: [phone] Office: [phone]  York Ram E 07/12/2020, 3:20 PM

## 2020-07-12 NOTE — Progress Notes (Signed)
PROGRESS NOTE    Dawn Morrow  MIW:[id number] DOB: June 29, 1936 DOA: 07/11/2020 PCP: System, Provider Not In    Brief Narrative:  84 y.o. female with medical history significant of dementia, frequent falls. Presenting after unwitnessed fall at her ALF. Patient is a poor historian d/t mentation. Hx of EDP report. She had a recent visit to the ED for the same issues a few days ago. Per ED report, she was sent home with HHPT/OT. She was in a session today and apparently she was unable to bear weight on her leg during the session. She was allowed to sit in her wheelchair and was left unattended temporarily. At was at that time that she fell out of her chair. She was found on the floor shortly after. The patient does not remember the fall. The facility became concerned and sent her back to the ED.   In the ED, CT head and right hip were negative for new injury. ALF was unwilling to take patient back. TRH was called for admission  Assessment & Plan:   Active Problems:   Fall   Dehydration  Falls     - patient had denied CP, palpitations, dyspnea prior to fall     - CT right hip w/o acute fracture, but she reportedly complained of hip pain with inversion of right knee     - will ask PT/OT to assess     - multiple falls over last several months; she lives in an ALF, she may need higher level of care for outpt living; will ask TOC to come onboard   Hypothyroidism -Most recent TSH from 7/20 noted to be 5.320     - continue levothyroxine  COPD Chronic hypercapnic and hypoxic respiratory failure     - Normally on 3L at baseline; continue     - continue home inhalers  Acute on CKD3b     - Cr up to 1.34 from 1.08 on 11/4 -Pt with dark, concentrated appearing urine -Staff reports pt has decreased PO intake and as of this AM, only at one banana and sips of liquids for her meds. Remainder of breakfast tray seemingly untouched -There are concerns for dehydration, thus pt will benefit from basal  IVF hydration -Repeat renal panel in AM  HLD     - continue lipitor, zetia as tolerated -Repeat cmp in AM  Depression/Anxiety     - continue bupropion, zoloft as tolerated  DM2 w/ neuropathy     - SSI, DM diet, glucose checks, A1c     - continue neurontin as tolerated -This AM, pt denied acute pain  Hx of PE     - continue xarelto as tolerated -No evidence of acute blood loss at this time  HTN     -continue with coreg as tolerated  Dehydration -Per above, concerns for ARF with clinical dehydration and observed poor PO intake -Continue on IVF hydration -Repeat bmet in AM -Will consult dietitian   DVT prophylaxis: Xarelto Code Status: Full Family Communication: Pt in room, family not at bedside  Status is: Inpatient  Remains inpatient appropriate because:Unsafe d/c plan and IV treatments appropriate due to intensity of illness or inability to take PO   Dispo: The patient is from: Home              Anticipated d/c is to: SNF              Anticipated d/c date is: 2 days  Patient currently is not medically stable to d/c.       Consultants:     Procedures:     Antimicrobials: Anti-infectives (From admission, onward)   None       Subjective: Reports not wanting to eat breakfast this AM  Objective: Vitals:   07/12/20 0119 07/12/20 0515 07/12/20 0528 07/12/20 1320  BP: (!) 141/77 (!) 152/80  (!) 132/100  Pulse: 74 77  64  Resp: 17 17  18   Temp: 97.6 F (36.4 C) 97.7 F (36.5 C)  97.9 F (36.6 C)  TempSrc:    Oral  SpO2: 93% 100%  100%  Weight:   84.3 kg   Height:   5' 5.5" (1.664 m)     Intake/Output Summary (Last 24 hours) at 07/12/2020 1812 Last data filed at 07/12/2020 1400 Gross per 24 hour  Intake 359.85 ml  Output 500 ml  Net -140.15 ml   Filed Weights   07/11/20 1558 07/12/20 0528  Weight: 83.9 kg 84.3 kg    Examination:  General exam: Appears calm and comfortable  Respiratory system: Clear to auscultation.  Respiratory effort normal. Cardiovascular system: S1 & S2 heard, Regular Gastrointestinal system: Abdomen is nondistended, soft and nontender. No organomegaly or masses felt. Normal bowel sounds heard. Central nervous system: Alert and oriented. No focal neurological deficits. Extremities: Symmetric 5 x 5 power. Skin: No rashes, lesions Psychiatry: Judgement and insight appear normal. Mood & affect appropriate.   Data Reviewed: I have personally reviewed following labs and imaging studies  CBC: Recent Labs  Lab 07/09/20 1641 07/11/20 1428 07/12/20 0459  WBC 8.8 11.6* 9.1  NEUTROABS 6.5 9.3*  --   HGB 11.8* 12.8 12.6  HCT 38.6 41.6 41.2  MCV 99.5 97.4 97.9  PLT 172 175 353   Basic Metabolic Panel: Recent Labs  Lab 07/09/20 1641 07/11/20 1428 07/12/20 0459  NA 141 138 140  K 4.1 4.1 4.1  CL 102 98 101  CO2 30 29 30   GLUCOSE 80 115* 103*  BUN 21 21 23   CREATININE 1.30* 1.08* 1.34*  CALCIUM 8.7* 9.1 9.1   GFR: Estimated Creatinine Clearance: 35.1 mL/min (A) (by C-G formula based on SCr of 1.34 mg/dL (H)). Liver Function Tests: Recent Labs  Lab 07/09/20 1641 07/12/20 0459  AST 20 16  ALT 18 15  ALKPHOS 84 72  BILITOT 0.8 0.9  PROT 7.0 7.1  ALBUMIN 3.5 3.7   No results for input(s): LIPASE, AMYLASE in the last 168 hours. No results for input(s): AMMONIA in the last 168 hours. Coagulation Profile: Recent Labs  Lab 07/09/20 1641  INR 1.7*   Cardiac Enzymes: No results for input(s): CKTOTAL, CKMB, CKMBINDEX, TROPONINI in the last 168 hours. BNP (last 3 results) No results for input(s): PROBNP in the last 8760 hours. HbA1C: Recent Labs    07/11/20 1636  HGBA1C 5.1   CBG: Recent Labs  Lab 07/11/20 1652 07/11/20 2052 07/12/20 0759 07/12/20 1159 07/12/20 1635  GLUCAP 81 119* 96 122* 100*   Lipid Profile: No results for input(s): CHOL, HDL, LDLCALC, TRIG, CHOLHDL, LDLDIRECT in the last 72 hours. Thyroid Function Tests: No results for input(s): TSH,  T4TOTAL, FREET4, T3FREE, THYROIDAB in the last 72 hours. Anemia Panel: No results for input(s): VITAMINB12, FOLATE, FERRITIN, TIBC, IRON, RETICCTPCT in the last 72 hours. Sepsis Labs: No results for input(s): PROCALCITON, LATICACIDVEN in the last 168 hours.  Recent Results (from the past 240 hour(s))  Urine culture     Status: Abnormal  Collection Time: 07/09/20  9:11 PM   Specimen: Urine, Clean Catch  Result Value Ref Range Status   Specimen Description   Final    URINE, CLEAN CATCH Performed at [facility], [place] [address] [place], [place] [address]    Special Requests   Final    NONE Performed at [facility], [place] [address], [place], [place] [address]    Culture MULTIPLE SPECIES PRESENT, SUGGEST RECOLLECTION (A)  Final   Report Status 07/11/2020 FINAL  Final  Respiratory Panel by RT PCR (Flu A&B, Covid) - Nasopharyngeal Swab     Status: None   Collection Time: 07/11/20  2:47 PM   Specimen: Nasopharyngeal Swab  Result Value Ref Range Status   SARS Coronavirus 2 by RT PCR NEGATIVE NEGATIVE Final    Comment: (NOTE) SARS-CoV-2 target nucleic acids are NOT DETECTED.  The SARS-CoV-2 RNA is generally detectable in upper respiratoy specimens during the acute phase of infection. The lowest concentration of SARS-CoV-2 viral copies this assay can detect is 131 copies/mL. A negative result does not preclude SARS-Cov-2 infection and should not be used as the sole basis for treatment or other patient management decisions. A negative result may occur with  improper specimen collection/handling, submission of specimen other than nasopharyngeal swab, presence of viral mutation(s) within the areas targeted by this assay, and inadequate number of viral copies (<131 copies/mL). A negative result must be combined with clinical observations, patient history, and epidemiological information. The expected result is Negative.  Fact Sheet for Patients:    PinkCheek.be  Fact Sheet for Healthcare Providers:  GravelBags.it  This test is no t yet approved or cleared by the [place] FDA and  has been authorized for detection and/or diagnosis of SARS-CoV-2 by FDA under an Emergency Use Authorization (EUA). This EUA will remain  in effect (meaning this test can be used) for the duration of the COVID-19 declaration under Section 564(b)(1) of the Act, 21 U.S.C. section 360bbb-3(b)(1), unless the authorization is terminated or revoked sooner.     Influenza A by PCR NEGATIVE NEGATIVE Final   Influenza B by PCR NEGATIVE NEGATIVE Final    Comment: (NOTE) The Xpert Xpress SARS-CoV-2/FLU/RSV assay is intended as an aid in  the diagnosis of influenza from Nasopharyngeal swab specimens and  should not be used as a sole basis for treatment. Nasal washings and  aspirates are unacceptable for Xpert Xpress SARS-CoV-2/FLU/RSV  testing.  Fact Sheet for Patients: PinkCheek.be  Fact Sheet for Healthcare Providers: GravelBags.it  This test is not yet approved or cleared by the [place] FDA and  has been authorized for detection and/or diagnosis of SARS-CoV-2 by  FDA under an Emergency Use Authorization (EUA). This EUA will remain  in effect (meaning this test can be used) for the duration of the  Covid-19 declaration under Section 564(b)(1) of the Act, 21  U.S.C. section 360bbb-3(b)(1), unless the authorization is  terminated or revoked. Performed at [facility], [place] [address], [place], [place] [address]      Radiology Studies: CT Head Wo Contrast  Result Date: 07/11/2020 CLINICAL DATA:  Unwitnessed fall. EXAM: CT HEAD WITHOUT CONTRAST TECHNIQUE: Contiguous axial images were obtained from the base of the skull through the vertex without intravenous contrast. COMPARISON:  July 09, 2020. FINDINGS: Brain:  Mild chronic ischemic white matter disease is noted. No mass effect or midline shift is noted. Ventricular size is within normal limits. There is no evidence of mass lesion, hemorrhage or  acute infarction. Vascular: No hyperdense vessel or unexpected calcification. Skull: Normal. Negative for fracture or focal lesion. Sinuses/Orbits: No acute finding. Other: None. IMPRESSION: Mild chronic ischemic white matter disease. No acute intracranial abnormality seen. Electronically Signed   By: Marijo Conception M.D.   On: 07/11/2020 13:47   CT Hip Right Wo Contrast  Result Date: 07/11/2020 CLINICAL DATA:  Right hip pain after fall. EXAM: CT OF THE RIGHT HIP WITHOUT CONTRAST TECHNIQUE: Multidetector CT imaging of the right hip was performed according to the standard protocol. Multiplanar CT image reconstructions were also generated. COMPARISON:  CT right hip dated July 09, 2020. FINDINGS: Bones/Joint/Cartilage No fracture or dislocation. Unchanged mild right hip osteoarthritis. No joint effusion. Ligaments Ligaments are suboptimally evaluated by CT. Muscles and Tendons Grossly intact. Soft tissue No fluid collection or hematoma. No soft tissue mass. Sigmoid colonic diverticulosis. IMPRESSION: 1. No acute osseous abnormality. Electronically Signed   By: Titus Dubin M.D.   On: 07/11/2020 13:40   DG Hip Unilat With Pelvis 2-3 Views Right  Result Date: 07/11/2020 CLINICAL DATA:  Right hip pain after a fall. EXAM: DG HIP (WITH OR WITHOUT PELVIS) 2-3V RIGHT COMPARISON:  Hip radiographs dated 07/09/2020. FINDINGS: There is no evidence of hip fracture or dislocation. Mild degenerative changes are seen in both hips. IMPRESSION: No acute osseous injury. Electronically Signed   By: Zerita Boers M.D.   On: 07/11/2020 11:56    Scheduled Meds: . atorvastatin  10 mg Oral QHS  . buPROPion  450 mg Oral Daily  . carvedilol  12.5 mg Oral BID WC  . docusate sodium  100 mg Oral Daily  . ezetimibe  10 mg Oral Daily  .  insulin aspart  0-15 Units Subcutaneous TID WC  . insulin aspart  0-5 Units Subcutaneous QHS  . levothyroxine  88 mcg Oral QAC breakfast  . mouth rinse  15 mL Mouth Rinse BID  . rivaroxaban  20 mg Oral QPM  . sertraline  75 mg Oral Daily   Continuous Infusions: . sodium chloride 75 mL/hr at 07/12/20 1136     LOS: 0 days   Marylu Lund, MD [facility] Pager On Amion  If 7PM-7AM, please contact night-coverage 07/12/2020, 6:12 PM

## 2020-07-12 NOTE — NC FL2 (Signed)
[place] LEVEL OF CARE SCREENING TOOL     IDENTIFICATION  Patient Name: Dawn Morrow Birthdate: 1936/02/05 Sex: female Admission Date (Current Location): 07/11/2020  [facility] and [place] Number:  Herbalist and Address:  [facility],  [place], [place]      Provider Number: [id number]  Attending Physician Name and Address:  Donne Hazel, MD  Relative Name and Phone Number:  Julianne Rice   [phone]    Current Level of Care: Hospital Recommended Level of Care: [place] Prior Approval Number:    Date Approved/Denied:   PASRR Number: [id number]  Discharge Plan: SNF    Current Diagnoses: Patient Active Problem List   Diagnosis Date Noted  . Fall 07/11/2020  . Acute metabolic encephalopathy 29/51/8841  . AMS (altered mental status) 03/26/2020  . History of pulmonary embolism 03/26/2020  . History of DVT (deep vein thrombosis) 03/26/2020  . Atrial fibrillation, chronic ([place]) 03/26/2020  . Sepsis ([place]) 12/14/2019  . Acute encephalopathy 12/14/2019  . COVID-19 virus infection 10/02/2019  . Acute lower UTI 10/02/2019  . Acute kidney failure ([place]) 10/01/2019  . Syncope 09/18/2017  . Near syncope 09/17/2017  . Diabetes mellitus type 2 in obese ([place]) 09/17/2017  . Atrial fibrillation with RVR ([place]) 09/17/2017  . Hypomagnesemia 09/17/2017  . CKD (chronic kidney disease), stage III ([place]) 09/17/2017  . Forehead contusion   . MGUS (monoclonal gammopathy of unknown significance) 08/13/2016  . Renal failure 07/09/2015  . Follow-up ---------PCP NOTES 05/17/2015  . Essential tremor 12/28/2014  . Ulnar neuropathy of left upper extremity 12/28/2014  . Intractable pain 06/21/2014  . Back pain 06/21/2014  . Lumbar radiculopathy 06/21/2014  . Encounter for therapeutic drug monitoring 03/14/2014  . Numbness 10/18/2013  . TIA (transient ischemic attack) 09/19/2013  . Weakness 09/18/2013  .  Annual physical exam 05/30/2012  . Tremor 01/21/2012  . Failure to thrive and poor med compliance  01/21/2012  . Pulmonary embolism ([place]) 11/18/2010  . DVT (deep venous thrombosis) ([place]) 11/18/2010  . Long term current use of anticoagulant 11/18/2010  . ABNORMAL ELECTROCARDIOGRAM 11/10/2010  . OSTEOARTHRITIS 08/06/2010  . DIZZINESS 04/15/2010  . Diabetes ([place]) 06/04/2009  . ACNE ROSACEA 11/28/2008  . BACK PAIN 05/16/2008  . HIP PAIN, RIGHT, CHRONIC 09/15/2007  . Hypothyroidism 09/13/2007  . Dyslipidemia 09/13/2007  . Osteoporosis 07/22/2007  . DEPRESSION 09/11/2006  . HTN (hypertension) 09/11/2006  . ASTHMA 09/11/2006  . COPD (chronic obstructive pulmonary disease) ([place]) 09/11/2006  . GERD 09/11/2006  . Recurrent UTI 09/11/2006  . GREENFIELD FILTER INSERTION, HX OF 09/11/2006    Orientation RESPIRATION BLADDER Height & Weight     Self  O2 Incontinent Weight: 185 lb 13.6 oz (84.3 kg) Height:  5' 5.5" (166.4 cm)  BEHAVIORAL SYMPTOMS/MOOD NEUROLOGICAL BOWEL NUTRITION STATUS      Incontinent Diet (Carb Modified)  AMBULATORY STATUS COMMUNICATION OF NEEDS Skin   Extensive Assist Verbally Normal                       Personal Care Assistance Level of Assistance  Bathing, Dressing, Feeding Bathing Assistance: Maximum assistance Feeding assistance: Limited assistance Dressing Assistance: Maximum assistance     Functional Limitations Info  Sight, Hearing, Speech Sight Info: Impaired Hearing Info: Adequate Speech Info: Adequate    SPECIAL CARE FACTORS FREQUENCY  PT (By licensed PT), OT (By licensed OT)     PT Frequency: 5x/week OT Frequency: 5x/week  Contractures Contractures Info: Not present    Additional Factors Info  Code Status, Allergies, Psychotropic, Insulin Sliding Scale Code Status Info: Fullcode Allergies Info: Allergies: Penicillins, Codeine, Penicillins, Sulfonylureas, Sulfonylureas, Codeine, Sulfonamide Derivatives Psychotropic Info:  Wellbutrin, Zoloft Insulin Sliding Scale Info: Refer to the medication list       Current Medications (07/12/2020):  This is the current hospital active medication list Current Facility-Administered Medications  Medication Dose Route Frequency Provider Last Rate Last Admin  . acetaminophen (TYLENOL) tablet 650 mg  650 mg Oral Q6H PRN Marylyn Ishihara, Tyrone A, DO       Or  . acetaminophen (TYLENOL) suppository 650 mg  650 mg Rectal Q6H PRN Marylyn Ishihara, Tyrone A, DO      . albuterol (VENTOLIN HFA) 108 (90 Base) MCG/ACT inhaler 2 puff  2 puff Inhalation Q6H PRN Marylyn Ishihara, Tyrone A, DO      . atorvastatin (LIPITOR) tablet 10 mg  10 mg Oral QHS Kyle, Tyrone A, DO   10 mg at 07/11/20 2207  . buPROPion (WELLBUTRIN XL) 24 hr tablet 450 mg  450 mg Oral Daily Kyle, Tyrone A, DO   450 mg at 07/12/20 0819  . carvedilol (COREG) tablet 12.5 mg  12.5 mg Oral BID WC Kyle, Tyrone A, DO   12.5 mg at 07/12/20 0821  . docusate sodium (COLACE) capsule 100 mg  100 mg Oral Daily Kyle, Tyrone A, DO   100 mg at 07/12/20 0820  . ezetimibe (ZETIA) tablet 10 mg  10 mg Oral Daily Kyle, Tyrone A, DO   10 mg at 07/12/20 5726  . insulin aspart (novoLOG) injection 0-15 Units  0-15 Units Subcutaneous TID WC Kyle, Tyrone A, DO      . insulin aspart (novoLOG) injection 0-5 Units  0-5 Units Subcutaneous QHS Kyle, Tyrone A, DO      . levothyroxine (SYNTHROID) tablet 88 mcg  88 mcg Oral QAC breakfast Marylyn Ishihara, Tyrone A, DO   88 mcg at 07/12/20 0525  . LORazepam (ATIVAN) tablet 0.5 mg  0.5 mg Oral Q8H PRN Marylyn Ishihara, Tyrone A, DO      . meclizine (ANTIVERT) tablet 25 mg  25 mg Oral QHS PRN Marylyn Ishihara, Tyrone A, DO      . MEDLINE mouth rinse  15 mL Mouth Rinse BID Marylyn Ishihara, Tyrone A, DO   15 mL at 07/12/20 2035  . ondansetron (ZOFRAN) tablet 4 mg  4 mg Oral Q6H PRN Marylyn Ishihara, Tyrone A, DO       Or  . ondansetron (ZOFRAN) injection 4 mg  4 mg Intravenous Q6H PRN Marylyn Ishihara, Tyrone A, DO      . oxyCODONE-acetaminophen (PERCOCET/ROXICET) 5-325 MG per tablet 1 tablet  1 tablet Oral Q6H  PRN Donne Hazel, MD      . polyethylene glycol (MIRALAX / GLYCOLAX) packet 17 g  17 g Oral Daily PRN Marylyn Ishihara, Tyrone A, DO      . rivaroxaban (XARELTO) tablet 20 mg  20 mg Oral QPM Kyle, Tyrone A, DO   20 mg at 07/11/20 1821  . sertraline (ZOLOFT) tablet 75 mg  75 mg Oral Daily Kyle, Tyrone A, DO   75 mg at 07/12/20 5974     Discharge Medications: Please see discharge summary for a list of discharge medications.  Relevant Imaging Results:  Relevant Lab Results:   Additional Information SS#: [id number]  Lia Hopping, LCSW

## 2020-07-12 NOTE — Evaluation (Signed)
Occupational Therapy Evaluation Patient Details Name: Dawn Morrow MRN: [id number] DOB: Oct 31, 1935 Today's Date: 07/12/2020    History of Present Illness LATESHA CHESNEY is a 84 y.o. female with medical history significant of dementia, frequent falls. Presenting after unwitnessed fall at her ALF. Patient is a poor historian d/t mentation. Hx of EDP report. She had a recent visit to the ED for the same issues a few days ago. Per ED report, she was sent home with HHPT/OT. She was in a session today and apparently she was unable to bear weight on her leg during the session. She was allowed to sit in her wheelchair and was left unattended temporarily. At was at that time that she fell out of her chair. She was found on the floor shortly after. The patient does not remember the fall. The facility became concerned and sent her back to the ED.   Clinical Impression   Ms. Jennfer Gassen presents today with pain in right groin area with any RLE movement with guarding behaviors limiting evaluation to bed level eval. Patient demonstrates functional ROM and strength of upper extremities and attempts to assist with scooting up in bed and for three attempts with supine to sit - but each time aborted by patient due to pain. Patient's ADLs limited to bed level at this time. Patient will benefit from skilled OT services while in hospital in order to improve deficits and learn compensatory strategies as needed in order to return to PLOF. Patient did not have pain medication ordered and therapist discussed with MD. Hopefully once pain improved patient will tolerate more activity. Patient will need short term rehab at discharge due to decline in functional status prior to return to ALF.    Follow Up Recommendations  SNF    Equipment Recommendations  None recommended by OT    Recommendations for Other Services       Precautions / Restrictions Precautions Precautions: Fall Restrictions Weight Bearing Restrictions: No       Mobility Bed Mobility Overal bed mobility: Needs Assistance Bed Mobility: Rolling Rolling: Mod assist         General bed mobility comments: Mod assist to roll. Unable to transfer at this time to side of bed due to patient guarding and resisting due to pain.    Transfers                      Balance                                           ADL either performed or assessed with clinical judgement   ADL Overall ADL's : Needs assistance/impaired Eating/Feeding: Set up;Bed level Eating/Feeding Details (indicate cue type and reason): patient assisted with set up for breakfast tray. Grooming: Bed level;Set up;Wash/dry face   Upper Body Bathing: Bed level;Minimal assistance;Set up   Lower Body Bathing: Total assistance;Bed level   Upper Body Dressing : Bed level;Moderate assistance   Lower Body Dressing: Total assistance;Bed level Lower Body Dressing Details (indicate cue type and reason): total to don socks     Toileting- Clothing Manipulation and Hygiene: Total assistance;Bed level Toileting - Clothing Manipulation Details (indicate cue type and reason): external catheter device being used             Vision Patient Visual Report: No change from baseline Vision Assessment?: No apparent visual deficits  Perception     Praxis      Pertinent Vitals/Pain Pain Assessment: Faces Faces Pain Scale: Hurts even more Pain Location: R groin Pain Descriptors / Indicators: Grimacing;Guarding;Moaning Pain Intervention(s): Limited activity within patient's tolerance;Other (comment) (Patient has no pain medicine ordered. Discussed with MD.)     Hand Dominance Right   Extremity/Trunk Assessment Upper Extremity Assessment Upper Extremity Assessment: Overall WFL for tasks assessed   Lower Extremity Assessment Lower Extremity Assessment:  (Unable to raise RLE off of bed or bend knee secondary to pain. Able to straight leg raise and  manuever LLE.)       Communication Communication Communication: HOH   Cognition Arousal/Alertness: Awake/alert Behavior During Therapy: WFL for tasks assessed/performed Overall Cognitive Status: History of cognitive impairments - at baseline                                     General Comments       Exercises     Shoulder Instructions      Home Living Family/patient expects to be discharged to:: Skilled nursing facility                                 Additional Comments: From [facility], poor historian. Per chart typically ambulates with RW.      Prior Functioning/Environment Level of Independence: Needs assistance  Gait / Transfers Assistance Needed: RW, recent difficulty with ambulation due to RLE pain. ADL's / Homemaking Assistance Needed: Unreliable historian - would assume she would need some asssitance with ADLs secondary to dementia/cognitive deficits.            OT Problem List: Decreased strength;Decreased range of motion;Decreased activity tolerance;Pain;Decreased cognition;Decreased safety awareness;Decreased knowledge of use of DME or AE;Obesity      OT Treatment/Interventions: Self-care/ADL training;Therapeutic exercise;DME and/or AE instruction;Therapeutic activities;Balance training;Patient/family education    OT Goals(Current goals can be found in the care plan section) Acute Rehab OT Goals OT Goal Formulation: Patient unable to participate in goal setting Time For Goal Achievement: 07/26/20 Potential to Achieve Goals: Good  OT Frequency: Min 2X/week   Barriers to D/C:            Co-evaluation              AM-PAC OT "6 Clicks" Daily Activity     Outcome Measure Help from another person eating meals?: A Little Help from another person taking care of personal grooming?: A Little Help from another person toileting, which includes using toliet, bedpan, or urinal?: Total Help from another person bathing  (including washing, rinsing, drying)?: A Lot Help from another person to put on and taking off regular upper body clothing?: A Lot Help from another person to put on and taking off regular lower body clothing?: Total 6 Click Score: 12   End of Session Nurse Communication: Mobility status (needf or pain medicine prior to further attempts at movement)  Activity Tolerance: Patient limited by pain Patient left: in bed;with call bell/phone within reach;with bed alarm set  OT Visit Diagnosis: Pain                Time: 6283-1517 OT Time Calculation (min): 21 min Charges:  OT General Charges $OT Visit: 1 Visit OT Evaluation $OT Eval Low Complexity: 1 Low  Retaj Hilbun, OTR/L [place]  Office [phone] Pager: [place]  L Zali Kamaka 07/12/2020, 11:16 AM

## 2020-07-13 DIAGNOSIS — F4321 Adjustment disorder with depressed mood: Secondary | ICD-10-CM

## 2020-07-13 LAB — GLUCOSE, CAPILLARY
Glucose-Capillary: 91 mg/dL (ref 70–99)
Glucose-Capillary: 128 mg/dL — ABNORMAL HIGH (ref 70–99)
Glucose-Capillary: 126 mg/dL — ABNORMAL HIGH (ref 70–99)
Glucose-Capillary: 102 mg/dL — ABNORMAL HIGH (ref 70–99)

## 2020-07-13 LAB — COMPREHENSIVE METABOLIC PANEL
ALT: 12 U/L (ref 0–44)
AST: 15 U/L (ref 15–41)
Albumin: 3.5 g/dL (ref 3.5–5.0)
Alkaline Phosphatase: 63 U/L (ref 38–126)
Anion gap: 12 (ref 5–15)
BUN: 22 mg/dL (ref 8–23)
CO2: 27 mmol/L (ref 22–32)
Calcium: 8.7 mg/dL — ABNORMAL LOW (ref 8.9–10.3)
Chloride: 101 mmol/L (ref 98–111)
Creatinine, Ser: 1.11 mg/dL — ABNORMAL HIGH (ref 0.44–1.00)
GFR, Estimated: 50 mL/min — ABNORMAL LOW (ref >60)
Glucose, Bld: 101 mg/dL — ABNORMAL HIGH (ref 70–99)
Potassium: 4.1 mmol/L (ref 3.5–5.1)
Sodium: 140 mmol/L (ref 135–145)
Total Bilirubin: 0.9 mg/dL (ref 0.3–1.2)
Total Protein: 6.9 g/dL (ref 6.5–8.1)

## 2020-07-13 LAB — CBC
HCT: 39.7 % (ref 36.0–46.0)
Hemoglobin: 12.1 g/dL (ref 12.0–15.0)
MCH: 30 pg (ref 26.0–34.0)
MCHC: 30.5 g/dL (ref 30.0–36.0)
MCV: 98.5 fL (ref 80.0–100.0)
Platelets: 172 10*3/uL (ref 150–400)
RBC: 4.03 MIL/uL (ref 3.87–5.11)
RDW: 16.5 % — ABNORMAL HIGH (ref 11.5–15.5)
WBC: 9.7 10*3/uL (ref 4.0–10.5)
nRBC: 0 % (ref 0.0–0.2)

## 2020-07-13 MED ORDER — ENSURE ENLIVE PO LIQD
237.0000 mL | Freq: Two times a day (BID) | ORAL | Status: DC
  Administered 2020-07-14 – 2020-07-19 (×9): 237 mL via ORAL

## 2020-07-13 MED ORDER — ADULT MULTIVITAMIN W/MINERALS CH
1.0000 | ORAL_TABLET | Freq: Every day | ORAL | Status: DC
  Administered 2020-07-14 – 2020-07-19 (×6): 1 via ORAL
  Filled 2020-07-13 (×6): qty 1

## 2020-07-13 MED ORDER — RIVAROXABAN 20 MG PO TABS
20.0000 mg | ORAL_TABLET | Freq: Every day | ORAL | Status: DC
  Administered 2020-07-13 – 2020-07-18 (×6): 20 mg via ORAL
  Filled 2020-07-13: qty 2
  Filled 2020-07-13: qty 1
  Filled 2020-07-13: qty 2
  Filled 2020-07-13 (×2): qty 1
  Filled 2020-07-13: qty 2

## 2020-07-13 MED ORDER — BUPROPION HCL ER (XL) 300 MG PO TB24
300.0000 mg | ORAL_TABLET | Freq: Every day | ORAL | Status: DC
  Administered 2020-07-14 – 2020-07-19 (×6): 300 mg via ORAL
  Filled 2020-07-13 (×6): qty 1

## 2020-07-13 MED ORDER — SERTRALINE HCL 100 MG PO TABS
100.0000 mg | ORAL_TABLET | Freq: Every day | ORAL | Status: DC
  Administered 2020-07-14 – 2020-07-19 (×6): 100 mg via ORAL
  Filled 2020-07-13: qty 2
  Filled 2020-07-13: qty 1
  Filled 2020-07-13: qty 2
  Filled 2020-07-13: qty 1
  Filled 2020-07-13: qty 2
  Filled 2020-07-13: qty 1

## 2020-07-13 NOTE — Progress Notes (Signed)
PROGRESS NOTE    Dawn Morrow  PNT:[id number] DOB: [birth year] DOA: 07/11/2020 PCP: System, Provider Not In    Brief Narrative:  84 y.o. female with medical history significant of dementia, frequent falls. Presenting after unwitnessed fall at her ALF. Patient is a poor historian d/t mentation. Hx of EDP report. She had a recent visit to the ED for the same issues a few days ago. Per ED report, she was sent home with HHPT/OT. She was in a session today and apparently she was unable to bear weight on her leg during the session. She was allowed to sit in her wheelchair and was left unattended temporarily. At was at that time that she fell out of her chair. She was found on the floor shortly after. The patient does not remember the fall. The facility became concerned and sent her back to the ED.   In the ED, CT head and right hip were negative for new injury. ALF was unwilling to take patient back. TRH was called for admission  Assessment & Plan:   Active Problems:   Fall   Dehydration  Falls     - patient had denied CP, palpitations, dyspnea prior to fall     - CT right hip w/o acute fracture, but she reportedly complained of hip pain with inversion of right knee     - will ask PT/OT to assess     - multiple falls over last several months; she lives in an ALF -PT/OT eval with recs for SNF. TOC following  Hypothyroidism -Most recent TSH from 7/20 noted to be 5.320     - continue levothyroxine as tolerated  COPD Chronic hypercapnic and hypoxic respiratory failure     - Normally on 3L at baseline; continue     - continue home bronchodilator as tolerated  Acute on CKD3b     - Cr up to 1.34 at time of presentation from 1.08 on 11/4 -Pt with dark, concentrated appearing urine -There are concerns for dehydration -Patient still taking minimal amounts of PO, thus pt will benefit from basal IVF hydration -Pt reports feeling depressed that she will not be seeing family as much, thus have  consulted Psychiatry for assistanct -Repeat renal panel in AM  HLD     - continue lipitor, zetia as tolerated -Recheck cmp in AM  Depression/Anxiety -Pt reports feeling more depressed, now not eating and leading to ARF requiring IVF hydration     - continue bupropion, zoloft as tolerated for now -Given worsened depression, have consulted Psychiatry for assistance  DM2 w/ neuropathy     - SSI, DM diet, glucose checks, A1c     - continue neurontin as tolerated -Pain noted on movement  Hx of PE     - continue xarelto as tolerated -No evidence of acute blood loss at this time  HTN     -continue with coreg as tolerated  Dehydration -Per above, concerns for ARF with clinical dehydration and observed poor PO intake -Continue on IVF hydration -recheck bmet in AM   DVT prophylaxis: Xarelto Code Status: Full Family Communication: Pt in room, family not at bedside  Status is: Inpatient  Remains inpatient appropriate because:Unsafe d/c plan and IV treatments appropriate due to intensity of illness or inability to take PO   Dispo: The patient is from: Home              Anticipated d/c is to: SNF  Anticipated d/c date is: 2 days              Patient currently is not medically stable to d/c.       Consultants:     Procedures:     Antimicrobials: Anti-infectives (From admission, onward)   None      Subjective: Still without desire to eat. Currently maintained on IVF hydration to sustain pt  Objective: Vitals:   07/12/20 1320 07/12/20 2142 07/13/20 0629 07/13/20 1337  BP: (!) 132/100 (!) 143/81 138/86 (!) 157/87  Pulse: 64 70 88 78  Resp: 18 16 20 18   Temp: 97.9 F (36.6 C) 97.6 F (36.4 C) (!) 97.4 F (36.3 C) 98.4 F (36.9 C)  TempSrc: Oral Oral Oral Oral  SpO2: 100% 99% 99% 98%  Weight:      Height:        Intake/Output Summary (Last 24 hours) at 07/13/2020 1508 Last data filed at 07/13/2020 1400 Gross per 24 hour  Intake 1065 ml    Output 700 ml  Net 365 ml   Filed Weights   07/11/20 1558 07/12/20 0528  Weight: 83.9 kg 84.3 kg    Examination: General exam: Awake, laying in bed, in nad, mucus membranes are dry Respiratory system: Normal respiratory effort, no wheezing Cardiovascular system: regular rate, s1, s2 Gastrointestinal system: Soft, nondistended, positive BS Central nervous system: CN2-12 grossly intact, strength intact Extremities: Perfused, no clubbing Skin: Normal skin turgor, no notable skin lesions seen Psychiatry: Mood depressed, tearful // no visual hallucinations   Data Reviewed: I have personally reviewed following labs and imaging studies  CBC: Recent Labs  Lab 07/09/20 1641 07/11/20 1428 07/12/20 0459 07/13/20 0642  WBC 8.8 11.6* 9.1 9.7  NEUTROABS 6.5 9.3*  --   --   HGB 11.8* 12.8 12.6 12.1  HCT 38.6 41.6 41.2 39.7  MCV 99.5 97.4 97.9 98.5  PLT 172 175 174 081   Basic Metabolic Panel: Recent Labs  Lab 07/09/20 1641 07/11/20 1428 07/12/20 0459 07/13/20 0642  NA 141 138 140 140  K 4.1 4.1 4.1 4.1  CL 102 98 101 101  CO2 30 29 30 27   GLUCOSE 80 115* 103* 101*  BUN 21 21 23 22   CREATININE 1.30* 1.08* 1.34* 1.11*  CALCIUM 8.7* 9.1 9.1 8.7*   GFR: Estimated Creatinine Clearance: 42.3 mL/min (A) (by C-G formula based on SCr of 1.11 mg/dL (H)). Liver Function Tests: Recent Labs  Lab 07/09/20 1641 07/12/20 0459 07/13/20 0642  AST 20 16 15   ALT 18 15 12   ALKPHOS 84 72 63  BILITOT 0.8 0.9 0.9  PROT 7.0 7.1 6.9  ALBUMIN 3.5 3.7 3.5   No results for input(s): LIPASE, AMYLASE in the last 168 hours. No results for input(s): AMMONIA in the last 168 hours. Coagulation Profile: Recent Labs  Lab 07/09/20 1641  INR 1.7*   Cardiac Enzymes: No results for input(s): CKTOTAL, CKMB, CKMBINDEX, TROPONINI in the last 168 hours. BNP (last 3 results) No results for input(s): PROBNP in the last 8760 hours. HbA1C: Recent Labs    07/11/20 1636  HGBA1C 5.1   CBG: Recent  Labs  Lab 07/12/20 1159 07/12/20 1635 07/12/20 2140 07/13/20 0751 07/13/20 1152  GLUCAP 122* 100* 93 91 128*   Lipid Profile: No results for input(s): CHOL, HDL, LDLCALC, TRIG, CHOLHDL, LDLDIRECT in the last 72 hours. Thyroid Function Tests: No results for input(s): TSH, T4TOTAL, FREET4, T3FREE, THYROIDAB in the last 72 hours. Anemia Panel: No results for input(s): VITAMINB12,  FOLATE, FERRITIN, TIBC, IRON, RETICCTPCT in the last 72 hours. Sepsis Labs: No results for input(s): PROCALCITON, LATICACIDVEN in the last 168 hours.  Recent Results (from the past 240 hour(s))  Urine culture     Status: Abnormal   Collection Time: 07/09/20  9:11 PM   Specimen: Urine, Clean Catch  Result Value Ref Range Status   Specimen Description   Final    URINE, CLEAN CATCH Performed at [facility], [place] [address] [place], [place] [address]    Special Requests   Final    NONE Performed at [facility], [place] [address] [place], [place] [address]    Culture MULTIPLE SPECIES PRESENT, SUGGEST RECOLLECTION (A)  Final   Report Status 07/11/2020 FINAL  Final  Respiratory Panel by RT PCR (Flu A&B, Covid) - Nasopharyngeal Swab     Status: None   Collection Time: 07/11/20  2:47 PM   Specimen: Nasopharyngeal Swab  Result Value Ref Range Status   SARS Coronavirus 2 by RT PCR NEGATIVE NEGATIVE Final    Comment: (NOTE) SARS-CoV-2 target nucleic acids are NOT DETECTED.  The SARS-CoV-2 RNA is generally detectable in upper respiratoy specimens during the acute phase of infection. The lowest concentration of SARS-CoV-2 viral copies this assay can detect is 131 copies/mL. A negative result does not preclude SARS-Cov-2 infection and should not be used as the sole basis for treatment or other patient management decisions. A negative result may occur with  improper specimen collection/handling, submission of specimen other than nasopharyngeal swab, presence of viral  mutation(s) within the areas targeted by this assay, and inadequate number of viral copies (<131 copies/mL). A negative result must be combined with clinical observations, patient history, and epidemiological information. The expected result is Negative.  Fact Sheet for Patients:  PinkCheek.be  Fact Sheet for Healthcare Providers:  GravelBags.it  This test is no t yet approved or cleared by the [place] FDA and  has been authorized for detection and/or diagnosis of SARS-CoV-2 by FDA under an Emergency Use Authorization (EUA). This EUA will remain  in effect (meaning this test can be used) for the duration of the COVID-19 declaration under Section 564(b)(1) of the Act, 21 U.S.C. section 360bbb-3(b)(1), unless the authorization is terminated or revoked sooner.     Influenza A by PCR NEGATIVE NEGATIVE Final   Influenza B by PCR NEGATIVE NEGATIVE Final    Comment: (NOTE) The Xpert Xpress SARS-CoV-2/FLU/RSV assay is intended as an aid in  the diagnosis of influenza from Nasopharyngeal swab specimens and  should not be used as a sole basis for treatment. Nasal washings and  aspirates are unacceptable for Xpert Xpress SARS-CoV-2/FLU/RSV  testing.  Fact Sheet for Patients: PinkCheek.be  Fact Sheet for Healthcare Providers: GravelBags.it  This test is not yet approved or cleared by the [place] FDA and  has been authorized for detection and/or diagnosis of SARS-CoV-2 by  FDA under an Emergency Use Authorization (EUA). This EUA will remain  in effect (meaning this test can be used) for the duration of the  Covid-19 declaration under Section 564(b)(1) of the Act, 21  U.S.C. section 360bbb-3(b)(1), unless the authorization is  terminated or revoked. Performed at [facility], [place] [address] [place], [place] [address]      Radiology  Studies: No results found.  Scheduled Meds: . atorvastatin  10 mg Oral QHS  . buPROPion  450 mg Oral Daily  . carvedilol  12.5 mg Oral BID WC  . docusate  sodium  100 mg Oral Daily  . ezetimibe  10 mg Oral Daily  . insulin aspart  0-15 Units Subcutaneous TID WC  . insulin aspart  0-5 Units Subcutaneous QHS  . levothyroxine  88 mcg Oral QAC breakfast  . mouth rinse  15 mL Mouth Rinse BID  . rivaroxaban  20 mg Oral Q supper  . sertraline  75 mg Oral Daily   Continuous Infusions: . sodium chloride 75 mL/hr at 07/13/20 1447     LOS: 1 day   Marylu Lund, MD [facility] Pager On Amion  If 7PM-7AM, please contact night-coverage 07/13/2020, 3:08 PM

## 2020-07-13 NOTE — Consult Note (Signed)
[facility] Consult   Reason for Consult: '' Depression, loss of appetite.'' Referring Physician:  Marylu Lund, MD Patient Identification: Dawn Morrow MRN:  [id number] Principal Diagnosis: Adjustment disorder with depressed mood Diagnosis:  Principal Problem:   Adjustment disorder with depressed mood Active Problems:   Fall   Dehydration   Total Time spent with patient: 1 hour  Subjective:   Dawn Morrow is a 84 y.o. female patient admitted due to fall.  HPI:  84 y.o.femalewith medical history significant ofdementia, Depression, Anxiety and frequent falls. Patient was brought to the hospital after unwitnessed fall at her ALF. Patient is a poor historian due to Dementia, she unable to report the sequence of events leading to her current admission. However, she does not appears depressed today but still struggling with loss of appetite. She has not been eating as she used to because she states that she does not like the hospital food. Patient is awake, alert, oriented only to person, not to time and place. She denies psychosis, delusions, depression and self harming thoughts.  Past Psychiatric History: as above  Risk to Self:  denies Risk to Others:  denies Prior Inpatient Therapy:  unknown Prior Outpatient Therapy:  unknown by the patient  Past Medical History:  Past Medical History:  Diagnosis Date  . Anxiety and depression   . Asthma   . Cataract   . COPD (chronic obstructive pulmonary disease) ([facility])   . Diabetes mellitus   . Diverticulosis   . DVT (deep vein thrombosis) in pregnancy    hx x multiple on coumadin  . GERD (gastroesophageal reflux disease)   . Hemorrhoids   . Hiatal hernia   . Hyperlipidemia   . Hypertension   . Hypothyroidism   . On home oxygen therapy    "2L; 24/7" (09/17/2017)  . Osteoarthritis    h/o spinal stenosis by MRI 2009  . Osteopenia   . Pulmonary embolism (HCC)    x 2  . Stroke [facility])    MINI  . Tubular adenoma of  colon 07/2005    Past Surgical History:  Procedure Laterality Date  . APPENDECTOMY    . CATARACT EXTRACTION    . CHOLECYSTECTOMY    . LUMBAR FUSION    . POLYPECTOMY    . TONSILLECTOMY    . TUBAL LIGATION     Family History:  Family History  Adopted: Yes  Problem Relation Age of Onset  . Cancer Mother        ? colon or ovarian  . Diabetes Mother   . Cancer Brother        ?  . CAD Neg Hx    Family Psychiatric  History:  Social History:  Social History   Substance and Sexual Activity  Alcohol Use No   Comment: socially     Social History   Substance and Sexual Activity  Drug Use No    Social History   Socioeconomic History  . Marital status: Divorced    Spouse name: Not on file  . Number of children: 3  . Years of education: Not on file  . Highest education level: Not on file  Occupational History  . Occupation: retired    Fish farm manager: UNEMPLOYED  Tobacco Use  . Smoking status: Former Smoker    Types: Cigarettes  . Smokeless tobacco: Never Used  Vaping Use  . Vaping Use: Never used  Substance and Sexual Activity  . Alcohol use: No    Comment: socially  .  Drug use: No  . Sexual activity: Not on file  Other Topics Concern  . Not on file  Social History Narrative   ** Merged History Encounter **       Single,   lost her sister in 2012. 3 children , all live in [place] to [place] late 10-2013 Nephew Joe Cells, lives in [place]; niece [name] in [place] [phone], [phone]) Doesn't drive     Social Determinants of Health   Financial Resource Strain:   . Difficulty of Paying Living Expenses: Not on file  Food Insecurity:   . Worried About Charity fundraiser in the Last Year: Not on file  . Ran Out of Food in the Last Year: Not on file  Transportation Needs:   . Lack of Transportation (Medical): Not on file  . Lack of Transportation (Non-Medical): Not on file  Physical Activity:   . Days of Exercise per Week: Not on file  . Minutes of  Exercise per Session: Not on file  Stress:   . Feeling of Stress : Not on file  Social Connections:   . Frequency of Communication with Friends and Family: Not on file  . Frequency of Social Gatherings with Friends and Family: Not on file  . Attends Religious Services: Not on file  . Active Member of Clubs or Organizations: Not on file  . Attends Archivist Meetings: Not on file  . Marital Status: Not on file   Additional Social History:    Allergies:   Allergies  Allergen Reactions  . Penicillins Anaphylaxis  . Codeine Other (See Comments)    Listed on MAR  . Penicillins Other (See Comments)    Listed on MAR  . Sulfonylureas Other (See Comments)    Unknown reaction  . Sulfonylureas Other (See Comments)    Listed on MAR  . Codeine Other (See Comments)    Unknown reaction  . Sulfonamide Derivatives Other (See Comments)    Unknown reaction    Labs:  Results for orders placed or performed during the hospital encounter of 07/11/20 (from the past 48 hour(s))  Hemoglobin A1c     Status: None   Collection Time: 07/11/20  4:36 PM  Result Value Ref Range   Hgb A1c MFr Bld 5.1 4.8 - 5.6 %    Comment: (NOTE) Pre diabetes:          5.7%-6.4%  Diabetes:              >6.4%  Glycemic control for   <7.0% adults with diabetes    Mean Plasma Glucose 99.67 mg/dL    Comment: Performed at [facility] [address] [place], [place] [address]  Glucose, capillary     Status: None   Collection Time: 07/11/20  4:52 PM  Result Value Ref Range   Glucose-Capillary 81 70 - 99 mg/dL    Comment: Glucose reference range applies only to samples taken after fasting for at least 8 hours.  Glucose, capillary     Status: Abnormal   Collection Time: 07/11/20  8:52 PM  Result Value Ref Range   Glucose-Capillary 119 (H) 70 - 99 mg/dL    Comment: Glucose reference range applies only to samples taken after fasting for at least 8 hours.   Comment 1 Notify RN   Comprehensive  metabolic panel     Status: Abnormal   Collection Time: 07/12/20  4:59 AM  Result Value Ref Range   Sodium 140 135 -  145 mmol/L   Potassium 4.1 3.5 - 5.1 mmol/L   Chloride 101 98 - 111 mmol/L   CO2 30 22 - 32 mmol/L   Glucose, Bld 103 (H) 70 - 99 mg/dL    Comment: Glucose reference range applies only to samples taken after fasting for at least 8 hours.   BUN 23 8 - 23 mg/dL   Creatinine, Ser 1.34 (H) 0.44 - 1.00 mg/dL   Calcium 9.1 8.9 - 10.3 mg/dL   Total Protein 7.1 6.5 - 8.1 g/dL   Albumin 3.7 3.5 - 5.0 g/dL   AST 16 15 - 41 U/L   ALT 15 0 - 44 U/L   Alkaline Phosphatase 72 38 - 126 U/L   Total Bilirubin 0.9 0.3 - 1.2 mg/dL   GFR, Estimated 40 (L) >60 mL/min    Comment: (NOTE) Calculated using the CKD-EPI Creatinine Equation (2021)    Anion gap 9 5 - 15    Comment: Performed at [facility], [place] [address], [place], [place] [address]  CBC     Status: Abnormal   Collection Time: 07/12/20  4:59 AM  Result Value Ref Range   WBC 9.1 4.0 - 10.5 K/uL   RBC 4.21 3.87 - 5.11 MIL/uL   Hemoglobin 12.6 12.0 - 15.0 g/dL   HCT 41.2 36 - 46 %   MCV 97.9 80.0 - 100.0 fL   MCH 29.9 26.0 - 34.0 pg   MCHC 30.6 30.0 - 36.0 g/dL   RDW 16.7 (H) 11.5 - 15.5 %   Platelets 174 150 - 400 K/uL   nRBC 0.0 0.0 - 0.2 %    Comment: Performed at [facility], [place] [address], [place], [place] [address]  Glucose, capillary     Status: None   Collection Time: 07/12/20  7:59 AM  Result Value Ref Range   Glucose-Capillary 96 70 - 99 mg/dL    Comment: Glucose reference range applies only to samples taken after fasting for at least 8 hours.  Glucose, capillary     Status: Abnormal   Collection Time: 07/12/20 11:59 AM  Result Value Ref Range   Glucose-Capillary 122 (H) 70 - 99 mg/dL    Comment: Glucose reference range applies only to samples taken after fasting for at least 8 hours.  Glucose, capillary     Status: Abnormal   Collection Time: 07/12/20  4:35 PM   Result Value Ref Range   Glucose-Capillary 100 (H) 70 - 99 mg/dL    Comment: Glucose reference range applies only to samples taken after fasting for at least 8 hours.  Glucose, capillary     Status: None   Collection Time: 07/12/20  9:40 PM  Result Value Ref Range   Glucose-Capillary 93 70 - 99 mg/dL    Comment: Glucose reference range applies only to samples taken after fasting for at least 8 hours.  Comprehensive metabolic panel     Status: Abnormal   Collection Time: 07/13/20  6:42 AM  Result Value Ref Range   Sodium 140 135 - 145 mmol/L   Potassium 4.1 3.5 - 5.1 mmol/L   Chloride 101 98 - 111 mmol/L   CO2 27 22 - 32 mmol/L   Glucose, Bld 101 (H) 70 - 99 mg/dL    Comment: Glucose reference range applies only to samples taken after fasting for at least 8 hours.   BUN 22 8 - 23 mg/dL   Creatinine, Ser 1.11 (H) 0.44 - 1.00 mg/dL   Calcium  8.7 (L) 8.9 - 10.3 mg/dL   Total Protein 6.9 6.5 - 8.1 g/dL   Albumin 3.5 3.5 - 5.0 g/dL   AST 15 15 - 41 U/L   ALT 12 0 - 44 U/L   Alkaline Phosphatase 63 38 - 126 U/L   Total Bilirubin 0.9 0.3 - 1.2 mg/dL   GFR, Estimated 50 (L) >60 mL/min    Comment: (NOTE) Calculated using the CKD-EPI Creatinine Equation (2021)    Anion gap 12 5 - 15    Comment: Performed at [facility], [place] [address], [place], [place] [address]  CBC     Status: Abnormal   Collection Time: 07/13/20  6:42 AM  Result Value Ref Range   WBC 9.7 4.0 - 10.5 K/uL   RBC 4.03 3.87 - 5.11 MIL/uL   Hemoglobin 12.1 12.0 - 15.0 g/dL   HCT 39.7 36 - 46 %   MCV 98.5 80.0 - 100.0 fL   MCH 30.0 26.0 - 34.0 pg   MCHC 30.5 30.0 - 36.0 g/dL   RDW 16.5 (H) 11.5 - 15.5 %   Platelets 172 150 - 400 K/uL   nRBC 0.0 0.0 - 0.2 %    Comment: Performed at [facility], [place] [address], [place], [place] [address]  Glucose, capillary     Status: None   Collection Time: 07/13/20  7:51 AM  Result Value Ref Range   Glucose-Capillary 91 70 - 99 mg/dL     Comment: Glucose reference range applies only to samples taken after fasting for at least 8 hours.  Glucose, capillary     Status: Abnormal   Collection Time: 07/13/20 11:52 AM  Result Value Ref Range   Glucose-Capillary 128 (H) 70 - 99 mg/dL    Comment: Glucose reference range applies only to samples taken after fasting for at least 8 hours.    Current Facility-Administered Medications  Medication Dose Route Frequency Provider Last Rate Last Admin  . 0.9 %  sodium chloride infusion   Intravenous Continuous Donne Hazel, MD 75 mL/hr at 07/13/20 1447 New Bag at 07/13/20 1447  . acetaminophen (TYLENOL) tablet 650 mg  650 mg Oral Q6H PRN Marylyn Ishihara, Tyrone A, DO       Or  . acetaminophen (TYLENOL) suppository 650 mg  650 mg Rectal Q6H PRN Marylyn Ishihara, Tyrone A, DO      . albuterol (VENTOLIN HFA) 108 (90 Base) MCG/ACT inhaler 2 puff  2 puff Inhalation Q6H PRN Marylyn Ishihara, Tyrone A, DO      . atorvastatin (LIPITOR) tablet 10 mg  10 mg Oral QHS Kyle, Tyrone A, DO   10 mg at 07/12/20 2100  . buPROPion (WELLBUTRIN XL) 24 hr tablet 450 mg  450 mg Oral Daily Kyle, Tyrone A, DO   450 mg at 07/13/20 1106  . carvedilol (COREG) tablet 12.5 mg  12.5 mg Oral BID WC Kyle, Tyrone A, DO   12.5 mg at 07/13/20 0756  . docusate sodium (COLACE) capsule 100 mg  100 mg Oral Daily Kyle, Tyrone A, DO   100 mg at 07/13/20 1106  . ezetimibe (ZETIA) tablet 10 mg  10 mg Oral Daily Kyle, Tyrone A, DO   10 mg at 07/13/20 1105  . [START ON 07/14/2020] feeding supplement (ENSURE ENLIVE / ENSURE PLUS) liquid 237 mL  237 mL Oral BID BM Donne Hazel, MD      . insulin aspart (novoLOG) injection 0-15 Units  0-15 Units Subcutaneous TID WC Marylyn Ishihara, Tyrone A,  DO   2 Units at 07/13/20 1312  . insulin aspart (novoLOG) injection 0-5 Units  0-5 Units Subcutaneous QHS Kyle, Tyrone A, DO      . levothyroxine (SYNTHROID) tablet 88 mcg  88 mcg Oral QAC breakfast Cherylann Ratel A, DO   88 mcg at 07/13/20 0619  . LORazepam (ATIVAN) tablet 0.5 mg  0.5 mg Oral Q8H  PRN Marylyn Ishihara, Tyrone A, DO      . meclizine (ANTIVERT) tablet 25 mg  25 mg Oral QHS PRN Marylyn Ishihara, Tyrone A, DO      . MEDLINE mouth rinse  15 mL Mouth Rinse BID Kyle, Tyrone A, DO   15 mL at 07/13/20 1107  . [START ON 07/14/2020] multivitamin with minerals tablet 1 tablet  1 tablet Oral Daily Donne Hazel, MD      . ondansetron [facility]) tablet 4 mg  4 mg Oral Q6H PRN Marylyn Ishihara, Tyrone A, DO       Or  . ondansetron (ZOFRAN) injection 4 mg  4 mg Intravenous Q6H PRN Marylyn Ishihara, Tyrone A, DO      . oxyCODONE-acetaminophen (PERCOCET/ROXICET) 5-325 MG per tablet 1 tablet  1 tablet Oral Q6H PRN Donne Hazel, MD   1 tablet at 07/12/20 1128  . polyethylene glycol (MIRALAX / GLYCOLAX) packet 17 g  17 g Oral Daily PRN Marylyn Ishihara, Tyrone A, DO      . rivaroxaban (XARELTO) tablet 20 mg  20 mg Oral Q supper Donne Hazel, MD      . sertraline (ZOLOFT) tablet 75 mg  75 mg Oral Daily Kyle, Tyrone A, DO   75 mg at 07/13/20 1105    Musculoskeletal: Strength & Muscle Tone: within normal limits Gait & Station: not tested Patient leans: N/A  Psychiatric Specialty Exam: Physical Exam Psychiatric:        Attention and Perception: Attention normal.        Mood and Affect: Mood and affect normal.        Speech: Speech normal.        Behavior: Behavior normal.        Thought Content: Thought content normal.        Cognition and Memory: Memory is impaired.        Judgment: Judgment normal.     Review of Systems  Constitutional: Negative.   HENT: Negative.   Eyes: Negative.   Respiratory: Negative.   Endocrine: Negative.   Genitourinary: Negative.   Psychiatric/Behavioral: Negative.     Blood pressure (!) 157/87, pulse 78, temperature 98.4 F (36.9 C), temperature source Oral, resp. rate 18, height 5' 5.5" (1.664 m), weight 84.3 kg, SpO2 98 %.Body mass index is 30.46 kg/m.  General Appearance: Casual  Eye Contact:  Good  Speech:  Clear and Coherent  Volume:  Normal  Mood:  Euthymic  Affect:  Appropriate  Thought  Process:  Disorganized  Orientation:  Other:  only to person  Thought Content:  Tangential  Suicidal Thoughts:  No  Homicidal Thoughts:  No  Memory:  Immediate;   Fair Recent;   Poor Remote;   Poor  Judgement:  Other:  unable to assess due to Dementia  Insight:  unable to assess due to Dementia  Psychomotor Activity:  Normal  Concentration:  Concentration: Fair and Attention Span: Fair  Recall:  Poor  Fund of Knowledge:  unable to assess due to Dementia  Language:  Good  Akathisia:  No  Handed:  Right  AIMS (if indicated):  Assets:  Desire for Improvement  ADL's:  Impaired  Cognition:  Impaired,  Moderate  Sleep:        Treatment Plan Summary: 84 year old female with history of Depression, anxiety and Dementia who was admitted due to an unwitnessed fall. Patient is alert, awake, denies psychosis, delusions, depression self harming thoughts but endorses poor appetite.  Recommendations: -Decrease Wellbutrin XL from 450 mg to 300 mg daily due to poor appetite -Increase Zoloft from 75 mg to 100 mg daily for anxiety/depression. - Refer patient to her outpatient psychiatrist upon discharge.  Disposition: No evidence of imminent risk to self or others at present.   Patient does not meet criteria for psychiatric inpatient admission. Psychiatric service signing out. Re-consult as needed  Corena Pilgrim, MD 07/13/2020 4:16 PM

## 2020-07-13 NOTE — Progress Notes (Signed)
Initial Nutrition Assessment  DOCUMENTATION CODES:   Obesity unspecified  INTERVENTION:   Ensure Enlive po BID, each supplement provides 350 kcal and 20 grams of protein  MVI daily  Would recommend checking vitamin B12 and vitamin D labs r/t frequent falls   NUTRITION DIAGNOSIS:   Inadequate oral intake related to acute illness as evidenced by meal completion < 25%.  GOAL:   Patient will meet greater than or equal to 90% of their needs  MONITOR:   PO intake, Supplement acceptance, Labs, Weight trends, Skin, I & O's  REASON FOR ASSESSMENT:   Consult Assessment of nutrition requirement/status  ASSESSMENT:   84 y/o female with h/o dementia, COPD, CKD III, HTN, HLD, hypothyroidism, DM, PE and recurrent falls who is admitted with fall and AKI  RD working remotely.  Did not attempt to call pt as pt noted to have dementia and is a poor historian. Per chart review, pt ate 15% of her breakfast this morning. RD suspects pt with poor appetite and oral intake pta. Per chart, pt is down 25lbs(12%) in < 3 months; this is significant. Pt's UBW appears to be ~220lbs. RD will add supplements and MVI to help pt meet her estimated needs. RD will attempt to obtain nutrition related history and exam at follow-up. Would recommend checking B12 and vitamin D labs as deficiency can lead to weakness.    Medications reviewed and include: colace, insulin, NaCl @75ml /hr  Labs reviewed: creat 1.11(H)  NUTRITION - FOCUSED PHYSICAL EXAM: Unable to perform at this time   Diet Order:   Diet Order            Diet Carb Modified Fluid consistency: Thin; Room service appropriate? No  Diet effective now                EDUCATION NEEDS:   No education needs have been identified at this time  Skin:  Skin Assessment: Reviewed RN Assessment  Last BM:  pta  Height:   Ht Readings from Last 1 Encounters:  07/12/20 5' 5.5" (1.664 m)    Weight:   Wt Readings from Last 1 Encounters:  07/12/20  84.3 kg    Ideal Body Weight:  57.9 kg  BMI:  Body mass index is 30.46 kg/m.  Estimated Nutritional Needs:   Kcal:  1700-1900kcal/day  Protein:  85-95g/day  Fluid:  1.5-1.8L/day  Koleen Distance MS, RD, LDN Please refer to [facility] for RD and/or RD on-call/weekend/after hours pager

## 2020-07-14 LAB — COMPREHENSIVE METABOLIC PANEL
ALT: 14 U/L (ref 0–44)
AST: 19 U/L (ref 15–41)
Albumin: 3.4 g/dL — ABNORMAL LOW (ref 3.5–5.0)
Alkaline Phosphatase: 68 U/L (ref 38–126)
Anion gap: 10 (ref 5–15)
BUN: 16 mg/dL (ref 8–23)
CO2: 27 mmol/L (ref 22–32)
Calcium: 8.6 mg/dL — ABNORMAL LOW (ref 8.9–10.3)
Chloride: 100 mmol/L (ref 98–111)
Creatinine, Ser: 0.91 mg/dL (ref 0.44–1.00)
GFR, Estimated: >60 mL/min (ref >60)
Glucose, Bld: 104 mg/dL — ABNORMAL HIGH (ref 70–99)
Potassium: 4.1 mmol/L (ref 3.5–5.1)
Sodium: 137 mmol/L (ref 135–145)
Total Bilirubin: 1.2 mg/dL (ref 0.3–1.2)
Total Protein: 7 g/dL (ref 6.5–8.1)

## 2020-07-14 LAB — GLUCOSE, CAPILLARY
Glucose-Capillary: 96 mg/dL (ref 70–99)
Glucose-Capillary: 125 mg/dL — ABNORMAL HIGH (ref 70–99)
Glucose-Capillary: 101 mg/dL — ABNORMAL HIGH (ref 70–99)
Glucose-Capillary: 126 mg/dL — ABNORMAL HIGH (ref 70–99)

## 2020-07-14 MED ORDER — AMLODIPINE BESYLATE 5 MG PO TABS
5.0000 mg | ORAL_TABLET | Freq: Every day | ORAL | Status: DC
  Administered 2020-07-14 – 2020-07-18 (×5): 5 mg via ORAL
  Filled 2020-07-14 (×5): qty 1

## 2020-07-14 MED ORDER — CARVEDILOL 25 MG PO TABS
25.0000 mg | ORAL_TABLET | Freq: Two times a day (BID) | ORAL | Status: DC
  Administered 2020-07-14 – 2020-07-18 (×9): 25 mg via ORAL
  Filled 2020-07-14 (×9): qty 1

## 2020-07-14 NOTE — Progress Notes (Signed)
PROGRESS NOTE    Dawn Morrow  QVZ:[id number] DOB: 1936/01/22 DOA: 07/11/2020 PCP: System, Provider Not In    Brief Narrative:  84 y.o. female with medical history significant of dementia, frequent falls. Presenting after unwitnessed fall at her ALF. Patient is a poor historian d/t mentation. Hx of EDP report. She had a recent visit to the ED for the same issues a few days ago. Per ED report, she was sent home with HHPT/OT. She was in a session today and apparently she was unable to bear weight on her leg during the session. She was allowed to sit in her wheelchair and was left unattended temporarily. At was at that time that she fell out of her chair. She was found on the floor shortly after. The patient does not remember the fall. The facility became concerned and sent her back to the ED.   In the ED, CT head and right hip were negative for new injury. ALF was unwilling to take patient back. TRH was called for admission  Assessment & Plan:   Principal Problem:   Adjustment disorder with depressed mood Active Problems:   Fall   Dehydration  Falls     - patient had denied CP, palpitations, dyspnea prior to fall     - CT right hip w/o acute fracture, but she reportedly complained of hip pain with inversion of right knee     - will ask PT/OT to assess     - multiple falls over last several months; she lives in an ALF -PT/OT eval with recs for SNF. TOC continues to follow for placement  Hypothyroidism -Most recent TSH from 7/20 noted to be 5.320     - continue levothyroxine as pt tolerates  COPD Chronic hypercapnic and hypoxic respiratory failure     - Normally on 3L at baseline; continue     - continue home bronchodilator as tolerated  Acute on CKD3b     - Cr up to 1.34 at time of presentation from 1.08 on 11/4 -There are concerns for dehydration -Continue with IVF hydration -Renal function improving -Recheck bmet in AM  HLD     - continue lipitor, zetia as  tolerated -Repeat CMP in AM  Depression/Anxiety -Pt reports feeling more depressed, now not eating and leading to ARF requiring IVF hydration     - continue bupropion, zoloft as tolerated for now -Given worsened depression, have consulted Psychiatry for assistance. Recommendation to decrease wellbutrin to 300mg  daily and increase zoloft to 100mg  -Recommend outpatient psychiatry follow up  DM2 w/ neuropathy     - SSI, DM diet, glucose checks, A1c     - continue neurontin as tolerated -Pain stable at this time  Hx of PE     - continue xarelto as tolerated -No evidence of acute blood loss at this time  HTN     -continue with coreg as tolerated  Dehydration -Per above, concerns for ARF with clinical dehydration and observed poor PO intake -Continue on IVF hydration per above -recheck bmet in AM   DVT prophylaxis: Xarelto Code Status: Full Family Communication: Pt in room, family not at bedside  Status is: Inpatient  Remains inpatient appropriate because:Unsafe d/c plan and IV treatments appropriate due to intensity of illness or inability to take PO   Dispo: The patient is from: Home              Anticipated d/c is to: SNF  Anticipated d/c date is: 2 days              Patient currently is not medically stable to d/c.       Consultants:     Procedures:     Antimicrobials: Anti-infectives (From admission, onward)   None      Subjective: Continues not wanting to eat. More alert and conversant today  Objective: Vitals:   07/13/20 2144 07/14/20 0506 07/14/20 0508 07/14/20 1358  BP: (!) 156/96 (!) 179/98 (!) 173/88 (!) 162/87  Pulse: 79 67 87 80  Resp: 18 19  17   Temp: (!) 97.5 F (36.4 C) (!) 97.5 F (36.4 C) 98.5 F (36.9 C) 98.2 F (36.8 C)  TempSrc: Axillary  Oral Oral  SpO2: 98% 99% 100% 94%  Weight:      Height:        Intake/Output Summary (Last 24 hours) at 07/14/2020 1506 Last data filed at 07/14/2020 1000 Gross per 24 hour   Intake 1776.08 ml  Output 1750 ml  Net 26.08 ml   Filed Weights   07/11/20 1558 07/12/20 0528  Weight: 83.9 kg 84.3 kg    Examination: General exam: Conversant, in no acute distress Respiratory system: normal chest rise, clear, no audible wheezing Cardiovascular system: regular rhythm, s1-s2 Gastrointestinal system: Nondistended, nontender, pos BS Central nervous system: No seizures, no tremors Extremities: No cyanosis, no joint deformities Skin: No rashes, no pallor Psychiatry: difficult to assess given mentation   Data Reviewed: I have personally reviewed following labs and imaging studies  CBC: Recent Labs  Lab 07/09/20 1641 07/11/20 1428 07/12/20 0459 07/13/20 0642  WBC 8.8 11.6* 9.1 9.7  NEUTROABS 6.5 9.3*  --   --   HGB 11.8* 12.8 12.6 12.1  HCT 38.6 41.6 41.2 39.7  MCV 99.5 97.4 97.9 98.5  PLT 172 175 174 034   Basic Metabolic Panel: Recent Labs  Lab 07/09/20 1641 07/11/20 1428 07/12/20 0459 07/13/20 0642 07/14/20 0619  NA 141 138 140 140 137  K 4.1 4.1 4.1 4.1 4.1  CL 102 98 101 101 100  CO2 30 29 30 27 27   GLUCOSE 80 115* 103* 101* 104*  BUN 21 21 23 22 16   CREATININE 1.30* 1.08* 1.34* 1.11* 0.91  CALCIUM 8.7* 9.1 9.1 8.7* 8.6*   GFR: Estimated Creatinine Clearance: 51.6 mL/min (by C-G formula based on SCr of 0.91 mg/dL). Liver Function Tests: Recent Labs  Lab 07/09/20 1641 07/12/20 0459 07/13/20 0642 07/14/20 0619  AST 20 16 15 19   ALT 18 15 12 14   ALKPHOS 84 72 63 68  BILITOT 0.8 0.9 0.9 1.2  PROT 7.0 7.1 6.9 7.0  ALBUMIN 3.5 3.7 3.5 3.4*   No results for input(s): LIPASE, AMYLASE in the last 168 hours. No results for input(s): AMMONIA in the last 168 hours. Coagulation Profile: Recent Labs  Lab 07/09/20 1641  INR 1.7*   Cardiac Enzymes: No results for input(s): CKTOTAL, CKMB, CKMBINDEX, TROPONINI in the last 168 hours. BNP (last 3 results) No results for input(s): PROBNP in the last 8760 hours. HbA1C: Recent Labs     07/11/20 1636  HGBA1C 5.1   CBG: Recent Labs  Lab 07/13/20 1152 07/13/20 1701 07/13/20 2143 07/14/20 0737 07/14/20 1144  GLUCAP 128* 126* 102* 96 125*   Lipid Profile: No results for input(s): CHOL, HDL, LDLCALC, TRIG, CHOLHDL, LDLDIRECT in the last 72 hours. Thyroid Function Tests: No results for input(s): TSH, T4TOTAL, FREET4, T3FREE, THYROIDAB in the last 72 hours. Anemia Panel:  No results for input(s): VITAMINB12, FOLATE, FERRITIN, TIBC, IRON, RETICCTPCT in the last 72 hours. Sepsis Labs: No results for input(s): PROCALCITON, LATICACIDVEN in the last 168 hours.  Recent Results (from the past 240 hour(s))  Urine culture     Status: Abnormal   Collection Time: 07/09/20  9:11 PM   Specimen: Urine, Clean Catch  Result Value Ref Range Status   Specimen Description   Final    URINE, CLEAN CATCH Performed at [facility], [place] [address] [place], [place] [address]    Special Requests   Final    NONE Performed at [facility], [place] [address] [place], [place] [address]    Culture MULTIPLE SPECIES PRESENT, SUGGEST RECOLLECTION (A)  Final   Report Status 07/11/2020 FINAL  Final  Respiratory Panel by RT PCR (Flu A&B, Covid) - Nasopharyngeal Swab     Status: None   Collection Time: 07/11/20  2:47 PM   Specimen: Nasopharyngeal Swab  Result Value Ref Range Status   SARS Coronavirus 2 by RT PCR NEGATIVE NEGATIVE Final    Comment: (NOTE) SARS-CoV-2 target nucleic acids are NOT DETECTED.  The SARS-CoV-2 RNA is generally detectable in upper respiratoy specimens during the acute phase of infection. The lowest concentration of SARS-CoV-2 viral copies this assay can detect is 131 copies/mL. A negative result does not preclude SARS-Cov-2 infection and should not be used as the sole basis for treatment or other patient management decisions. A negative result may occur with  improper specimen collection/handling, submission of specimen other than  nasopharyngeal swab, presence of viral mutation(s) within the areas targeted by this assay, and inadequate number of viral copies (<131 copies/mL). A negative result must be combined with clinical observations, patient history, and epidemiological information. The expected result is Negative.  Fact Sheet for Patients:  PinkCheek.be  Fact Sheet for Healthcare Providers:  GravelBags.it  This test is no t yet approved or cleared by the [place] FDA and  has been authorized for detection and/or diagnosis of SARS-CoV-2 by FDA under an Emergency Use Authorization (EUA). This EUA will remain  in effect (meaning this test can be used) for the duration of the COVID-19 declaration under Section 564(b)(1) of the Act, 21 U.S.C. section 360bbb-3(b)(1), unless the authorization is terminated or revoked sooner.     Influenza A by PCR NEGATIVE NEGATIVE Final   Influenza B by PCR NEGATIVE NEGATIVE Final    Comment: (NOTE) The Xpert Xpress SARS-CoV-2/FLU/RSV assay is intended as an aid in  the diagnosis of influenza from Nasopharyngeal swab specimens and  should not be used as a sole basis for treatment. Nasal washings and  aspirates are unacceptable for Xpert Xpress SARS-CoV-2/FLU/RSV  testing.  Fact Sheet for Patients: PinkCheek.be  Fact Sheet for Healthcare Providers: GravelBags.it  This test is not yet approved or cleared by the [place] FDA and  has been authorized for detection and/or diagnosis of SARS-CoV-2 by  FDA under an Emergency Use Authorization (EUA). This EUA will remain  in effect (meaning this test can be used) for the duration of the  Covid-19 declaration under Section 564(b)(1) of the Act, 21  U.S.C. section 360bbb-3(b)(1), unless the authorization is  terminated or revoked. Performed at [facility], [place] [address] [place], [place] [address]      Radiology Studies: No results found.  Scheduled Meds: . amLODipine  5 mg Oral Daily  . atorvastatin  10 mg Oral QHS  . buPROPion  300 mg Oral  Daily  . carvedilol  25 mg Oral BID WC  . docusate sodium  100 mg Oral Daily  . ezetimibe  10 mg Oral Daily  . feeding supplement  237 mL Oral BID BM  . insulin aspart  0-15 Units Subcutaneous TID WC  . insulin aspart  0-5 Units Subcutaneous QHS  . levothyroxine  88 mcg Oral QAC breakfast  . mouth rinse  15 mL Mouth Rinse BID  . multivitamin with minerals  1 tablet Oral Daily  . rivaroxaban  20 mg Oral Q supper  . sertraline  100 mg Oral Daily   Continuous Infusions: . sodium chloride 75 mL/hr at 07/14/20 1011     LOS: 2 days   Marylu Lund, MD [facility] Pager On Amion  If 7PM-7AM, please contact night-coverage 07/14/2020, 3:06 PM

## 2020-07-15 LAB — COMPREHENSIVE METABOLIC PANEL
ALT: 13 U/L (ref 0–44)
AST: 16 U/L (ref 15–41)
Albumin: 3.2 g/dL — ABNORMAL LOW (ref 3.5–5.0)
Alkaline Phosphatase: 70 U/L (ref 38–126)
Anion gap: 8 (ref 5–15)
BUN: 17 mg/dL (ref 8–23)
CO2: 27 mmol/L (ref 22–32)
Calcium: 8.8 mg/dL — ABNORMAL LOW (ref 8.9–10.3)
Chloride: 102 mmol/L (ref 98–111)
Creatinine, Ser: 0.87 mg/dL (ref 0.44–1.00)
GFR, Estimated: >60 mL/min (ref >60)
Glucose, Bld: 115 mg/dL — ABNORMAL HIGH (ref 70–99)
Potassium: 3.6 mmol/L (ref 3.5–5.1)
Sodium: 137 mmol/L (ref 135–145)
Total Bilirubin: 0.6 mg/dL (ref 0.3–1.2)
Total Protein: 6.8 g/dL (ref 6.5–8.1)

## 2020-07-15 LAB — GLUCOSE, CAPILLARY
Glucose-Capillary: 107 mg/dL — ABNORMAL HIGH (ref 70–99)
Glucose-Capillary: 112 mg/dL — ABNORMAL HIGH (ref 70–99)
Glucose-Capillary: 118 mg/dL — ABNORMAL HIGH (ref 70–99)
Glucose-Capillary: 131 mg/dL — ABNORMAL HIGH (ref 70–99)

## 2020-07-15 MED ORDER — GABAPENTIN 300 MG PO CAPS
600.0000 mg | ORAL_CAPSULE | Freq: Every day | ORAL | Status: DC
  Administered 2020-07-15 – 2020-07-18 (×4): 600 mg via ORAL
  Filled 2020-07-15 (×4): qty 2

## 2020-07-15 MED ORDER — GABAPENTIN 300 MG PO CAPS
300.0000 mg | ORAL_CAPSULE | Freq: Two times a day (BID) | ORAL | Status: DC
  Administered 2020-07-15 – 2020-07-19 (×8): 300 mg via ORAL
  Filled 2020-07-15 (×8): qty 1

## 2020-07-15 MED ORDER — GABAPENTIN 300 MG PO CAPS: 300.0000 mg | ORAL_CAPSULE | Freq: Every day | ORAL | Status: DC

## 2020-07-15 MED ORDER — HYDRALAZINE HCL 20 MG/ML IJ SOLN
5.0000 mg | Freq: Once | INTRAMUSCULAR | Status: AC
  Administered 2020-07-15: 5 mg via INTRAVENOUS
  Filled 2020-07-15: qty 1

## 2020-07-15 NOTE — Progress Notes (Signed)
PROGRESS NOTE    Dawn Morrow  CWC:[id number] DOB: Nov 07, 1935 DOA: 07/11/2020 PCP: System, Provider Not In    Brief Narrative:  84 y.o. female with medical history significant of dementia, frequent falls. Presenting after unwitnessed fall at her ALF. Patient is a poor historian d/t mentation. Hx of EDP report. She had a recent visit to the ED for the same issues a few days ago. Per ED report, she was sent home with HHPT/OT. She was in a session today and apparently she was unable to bear weight on her leg during the session. She was allowed to sit in her wheelchair and was left unattended temporarily. At was at that time that she fell out of her chair. She was found on the floor shortly after. The patient does not remember the fall. The facility became concerned and sent her back to the ED.   In the ED, CT head and right hip were negative for new injury. ALF was unwilling to take patient back. TRH was called for admission  Assessment & Plan:   Principal Problem:   Adjustment disorder with depressed mood Active Problems:   Fall   Dehydration  Falls     - patient had denied CP, palpitations, dyspnea prior to fall     - CT right hip w/o acute fracture, but she reportedly complained of hip pain with inversion of right knee     - will ask PT/OT to assess     - multiple falls over last several months; she lives in an ALF -PT/OT eval with recs for SNF. TOC is following  Hypothyroidism -Most recent TSH from 7/20 noted to be 5.320     - continue levothyroxine as pt tolerates  COPD Chronic hypercapnic and hypoxic respiratory failure     - Normally on 3L at baseline; continue     - continue home bronchodilator as tolerated, no audible wheezing this AM  Acute on CKD3b     - Cr up to 1.34 at time of presentation from 1.08 on 11/4 -There are concerns for dehydration -Continue with IVF hydration -Renal function improving -Repeat bmet in AM  HLD     - continue lipitor, zetia as  tolerated -Repeat CMP in AM  Depression/Anxiety -Pt reports feeling more depressed, now not eating and leading to ARF requiring IVF hydration     - continue bupropion, zoloft as tolerated for now -Given worsened depression, have consulted Psychiatry for assistance. Recommendation to decrease wellbutrin to 300mg  daily and increase zoloft to 100mg  -Recommend outpatient psychiatry follow up at time of d/c  DM2 w/ neuropathy     - SSI, DM diet, glucose checks, A1c     - continue neurontin as tolerated -Pain stable at this time  Hx of PE     - continue xarelto as tolerated -remains without evidence of acute blood loss  HTN     -continue with coreg as tolerated  Dehydration -Per above, concerns for ARF with clinical dehydration and observed poor PO intake -Continue on IVF hydration per above -repeat bmet in AM -Appreciate input by dietitian. Noted to have inadequate oral intake evidenced by meal completion <25%. Recommendation for ensure BID   DVT prophylaxis: Xarelto Code Status: Full Family Communication: Pt in room, family not at bedside  Status is: Inpatient  Remains inpatient appropriate because:Unsafe d/c plan and IV treatments appropriate due to intensity of illness or inability to take PO   Dispo: The patient is from: Home  Anticipated d/c is to: SNF              Anticipated d/c date is: 2 days              Patient currently is not medically stable to d/c.   Consultants:     Procedures:     Antimicrobials: Anti-infectives (From admission, onward)   None      Subjective: Still not wanting to eat this AM  Objective: Vitals:   07/14/20 2132 07/15/20 0513 07/15/20 0726 07/15/20 1401  BP: (!) 175/97 (!) 187/92 (!) 156/92 (!) 143/78  Pulse: 78 75 72 78  Resp: 18 18 18 14   Temp: 97.6 F (36.4 C) 97.7 F (36.5 C)  (!) 97.3 F (36.3 C)  TempSrc: Oral Oral  Oral  SpO2: 100% 98%  100%  Weight:      Height:        Intake/Output Summary  (Last 24 hours) at 07/15/2020 1442 Last data filed at 07/15/2020 4008 Gross per 24 hour  Intake 2227.4 ml  Output 2500 ml  Net -272.6 ml   Filed Weights   07/11/20 1558 07/12/20 0528  Weight: 83.9 kg 84.3 kg    Examination: General exam: Awake, laying in bed, in nad Respiratory system: Normal respiratory effort, no wheezing Cardiovascular system: regular rate, s1, s2 Gastrointestinal system: Soft, nondistended, positive BS Central nervous system: CN2-12 grossly intact, strength intact Extremities: Perfused, no clubbing Skin: Normal skin turgor, no notable skin lesions seen Psychiatry: Mood normal // no visual hallucinations    Data Reviewed: I have personally reviewed following labs and imaging studies  CBC: Recent Labs  Lab 07/09/20 1641 07/11/20 1428 07/12/20 0459 07/13/20 0642  WBC 8.8 11.6* 9.1 9.7  NEUTROABS 6.5 9.3*  --   --   HGB 11.8* 12.8 12.6 12.1  HCT 38.6 41.6 41.2 39.7  MCV 99.5 97.4 97.9 98.5  PLT 172 175 174 676   Basic Metabolic Panel: Recent Labs  Lab 07/11/20 1428 07/12/20 0459 07/13/20 0642 07/14/20 0619 07/15/20 0457  NA 138 140 140 137 137  K 4.1 4.1 4.1 4.1 3.6  CL 98 101 101 100 102  CO2 29 30 27 27 27   GLUCOSE 115* 103* 101* 104* 115*  BUN 21 23 22 16 17   CREATININE 1.08* 1.34* 1.11* 0.91 0.87  CALCIUM 9.1 9.1 8.7* 8.6* 8.8*   GFR: Estimated Creatinine Clearance: 54 mL/min (by C-G formula based on SCr of 0.87 mg/dL). Liver Function Tests: Recent Labs  Lab 07/09/20 1641 07/12/20 0459 07/13/20 0642 07/14/20 0619 07/15/20 0457  AST 20 16 15 19 16   ALT 18 15 12 14 13   ALKPHOS 84 72 63 68 70  BILITOT 0.8 0.9 0.9 1.2 0.6  PROT 7.0 7.1 6.9 7.0 6.8  ALBUMIN 3.5 3.7 3.5 3.4* 3.2*   No results for input(s): LIPASE, AMYLASE in the last 168 hours. No results for input(s): AMMONIA in the last 168 hours. Coagulation Profile: Recent Labs  Lab 07/09/20 1641  INR 1.7*   Cardiac Enzymes: No results for input(s): CKTOTAL, CKMB,  CKMBINDEX, TROPONINI in the last 168 hours. BNP (last 3 results) No results for input(s): PROBNP in the last 8760 hours. HbA1C: No results for input(s): HGBA1C in the last 72 hours. CBG: Recent Labs  Lab 07/14/20 1144 07/14/20 1645 07/14/20 2129 07/15/20 0727 07/15/20 1135  GLUCAP 125* 101* 126* 107* 112*   Lipid Profile: No results for input(s): CHOL, HDL, LDLCALC, TRIG, CHOLHDL, LDLDIRECT in the last 72 hours. Thyroid  Function Tests: No results for input(s): TSH, T4TOTAL, FREET4, T3FREE, THYROIDAB in the last 72 hours. Anemia Panel: No results for input(s): VITAMINB12, FOLATE, FERRITIN, TIBC, IRON, RETICCTPCT in the last 72 hours. Sepsis Labs: No results for input(s): PROCALCITON, LATICACIDVEN in the last 168 hours.  Recent Results (from the past 240 hour(s))  Urine culture     Status: Abnormal   Collection Time: 07/09/20  9:11 PM   Specimen: Urine, Clean Catch  Result Value Ref Range Status   Specimen Description   Final    URINE, CLEAN CATCH Performed at [facility], [place] [address], [place], [place] [address]    Special Requests   Final    NONE Performed at [facility], [place] [address] [place], [place] [address]    Culture MULTIPLE SPECIES PRESENT, SUGGEST RECOLLECTION (A)  Final   Report Status 07/11/2020 FINAL  Final  Respiratory Panel by RT PCR (Flu A&B, Covid) - Nasopharyngeal Swab     Status: None   Collection Time: 07/11/20  2:47 PM   Specimen: Nasopharyngeal Swab  Result Value Ref Range Status   SARS Coronavirus 2 by RT PCR NEGATIVE NEGATIVE Final    Comment: (NOTE) SARS-CoV-2 target nucleic acids are NOT DETECTED.  The SARS-CoV-2 RNA is generally detectable in upper respiratoy specimens during the acute phase of infection. The lowest concentration of SARS-CoV-2 viral copies this assay can detect is 131 copies/mL. A negative result does not preclude SARS-Cov-2 infection and should not be used as the sole basis for  treatment or other patient management decisions. A negative result may occur with  improper specimen collection/handling, submission of specimen other than nasopharyngeal swab, presence of viral mutation(s) within the areas targeted by this assay, and inadequate number of viral copies (<131 copies/mL). A negative result must be combined with clinical observations, patient history, and epidemiological information. The expected result is Negative.  Fact Sheet for Patients:  PinkCheek.be  Fact Sheet for Healthcare Providers:  GravelBags.it  This test is no t yet approved or cleared by the [place] FDA and  has been authorized for detection and/or diagnosis of SARS-CoV-2 by FDA under an Emergency Use Authorization (EUA). This EUA will remain  in effect (meaning this test can be used) for the duration of the COVID-19 declaration under Section 564(b)(1) of the Act, 21 U.S.C. section 360bbb-3(b)(1), unless the authorization is terminated or revoked sooner.     Influenza A by PCR NEGATIVE NEGATIVE Final   Influenza B by PCR NEGATIVE NEGATIVE Final    Comment: (NOTE) The Xpert Xpress SARS-CoV-2/FLU/RSV assay is intended as an aid in  the diagnosis of influenza from Nasopharyngeal swab specimens and  should not be used as a sole basis for treatment. Nasal washings and  aspirates are unacceptable for Xpert Xpress SARS-CoV-2/FLU/RSV  testing.  Fact Sheet for Patients: PinkCheek.be  Fact Sheet for Healthcare Providers: GravelBags.it  This test is not yet approved or cleared by the [place] FDA and  has been authorized for detection and/or diagnosis of SARS-CoV-2 by  FDA under an Emergency Use Authorization (EUA). This EUA will remain  in effect (meaning this test can be used) for the duration of the  Covid-19 declaration under Section 564(b)(1) of the Act, 21    U.S.C. section 360bbb-3(b)(1), unless the authorization is  terminated or revoked. Performed at [facility], [place] [address], [place], [place] [address]      Radiology Studies: No results found.  Scheduled Meds: . amLODipine  5 mg Oral Daily  . atorvastatin  10 mg Oral QHS  . buPROPion  300 mg Oral Daily  . carvedilol  25 mg Oral BID WC  . docusate sodium  100 mg Oral Daily  . ezetimibe  10 mg Oral Daily  . feeding supplement  237 mL Oral BID BM  . gabapentin  300 mg Oral BID WC   And  . gabapentin  600 mg Oral QHS  . insulin aspart  0-15 Units Subcutaneous TID WC  . insulin aspart  0-5 Units Subcutaneous QHS  . levothyroxine  88 mcg Oral QAC breakfast  . mouth rinse  15 mL Mouth Rinse BID  . multivitamin with minerals  1 tablet Oral Daily  . rivaroxaban  20 mg Oral Q supper  . sertraline  100 mg Oral Daily   Continuous Infusions: . sodium chloride 75 mL/hr at 07/14/20 2330     LOS: 3 days   Marylu Lund, MD [facility] Pager On Amion  If 7PM-7AM, please contact night-coverage 07/15/2020, 2:42 PM

## 2020-07-15 NOTE — Care Management Important Message (Signed)
Important Message  Patient Details IM Letter given to the Patient Name: Dawn Morrow MRN: [id number] Date of Birth: 12-11-1935   Medicare Important Message Given:  Yes     Kerin Salen 07/15/2020, 12:43 PM

## 2020-07-15 NOTE — TOC Progression Note (Signed)
Transition of Care [facility]) - Progression Note    Patient Details  Name: SHEREL FENNELL MRN: [id number] Date of Birth: 15-Aug-1936  Transition of Care [facility]) CM/SW [place], [place] Phone Number: 07/15/2020, 12:51 PM  Clinical Narrative:    Patient niece will bring the patient "favorite food" today to see if she can get the patient to eat.    Expected Discharge Plan: Skilled Nursing Facility Barriers to Discharge: Continued Medical Work up  Expected Discharge Plan and Services Expected Discharge Plan: [place] In-house Referral: Clinical Social Work Discharge Planning Services: CM Consult   Living arrangements for the past 2 months: [place]                                       Social Determinants of Health (SDOH) Interventions    Readmission Risk Interventions Readmission Risk Prevention Plan 10/03/2019  Transportation Screening Complete  PCP or Specialist Appt within 3-5 Days Complete  HRI or [place] Not Complete  HRI or Home Care Consult comments Patient at her baseline  Social Work Consult for [place] Planning/Counseling [place] Not Applicable  Medication Review [employer]) Referral to Pharmacy  Some recent data might be hidden

## 2020-07-15 NOTE — Progress Notes (Signed)
Patient HPOA is at bedside. She expresses concerns that patients mentation is very different from the other day when she last saw her. She states she can't even form sentences. I expressed understanding of her concerns. I told family member that I would make the doctor aware of her concerns.

## 2020-07-16 ENCOUNTER — Inpatient Hospital Stay (HOSPITAL_COMMUNITY): Payer: Medicare Other

## 2020-07-16 ENCOUNTER — Encounter (HOSPITAL_COMMUNITY): Payer: Self-pay | Admitting: Internal Medicine

## 2020-07-16 LAB — COMPREHENSIVE METABOLIC PANEL
ALT: 11 U/L (ref 0–44)
AST: 22 U/L (ref 15–41)
Albumin: 3.2 g/dL — ABNORMAL LOW (ref 3.5–5.0)
Alkaline Phosphatase: 77 U/L (ref 38–126)
Anion gap: 8 (ref 5–15)
BUN: 18 mg/dL (ref 8–23)
CO2: 26 mmol/L (ref 22–32)
Calcium: 9.3 mg/dL (ref 8.9–10.3)
Chloride: 103 mmol/L (ref 98–111)
Creatinine, Ser: 0.89 mg/dL (ref 0.44–1.00)
GFR, Estimated: >60 mL/min (ref >60)
Glucose, Bld: 104 mg/dL — ABNORMAL HIGH (ref 70–99)
Potassium: 4.2 mmol/L (ref 3.5–5.1)
Sodium: 137 mmol/L (ref 135–145)
Total Bilirubin: 0.7 mg/dL (ref 0.3–1.2)
Total Protein: 7.4 g/dL (ref 6.5–8.1)

## 2020-07-16 LAB — GLUCOSE, CAPILLARY
Glucose-Capillary: 121 mg/dL — ABNORMAL HIGH (ref 70–99)
Glucose-Capillary: 190 mg/dL — ABNORMAL HIGH (ref 70–99)
Glucose-Capillary: 113 mg/dL — ABNORMAL HIGH (ref 70–99)

## 2020-07-16 LAB — MAGNESIUM: Magnesium: 1.9 mg/dL (ref 1.7–2.4)

## 2020-07-16 NOTE — Progress Notes (Signed)
MD notified and rapid response was called.  Pt not really responding to our voices.  Will react to stimuli.  Vital signs stable. No obvious stroke signs. Pt was alert and eating earlier in the day. Falls back to sleep when not trying to rouse her. Afib was noted on ekg. Orders given.

## 2020-07-16 NOTE — Progress Notes (Addendum)
PROGRESS NOTE    Dawn Morrow  PZW:[id number] DOB: 1936/08/02 DOA: 07/11/2020 PCP: System, Provider Not In    Brief Narrative:  84 y.o. female with medical history significant of dementia, frequent falls. Presenting after unwitnessed fall at her ALF. Patient is a poor historian d/t mentation. Hx of EDP report. She had a recent visit to the ED for the same issues a few days ago. Per ED report, she was sent home with HHPT/OT. She was in a session today and apparently she was unable to bear weight on her leg during the session. She was allowed to sit in her wheelchair and was left unattended temporarily. At was at that time that she fell out of her chair. She was found on the floor shortly after. The patient does not remember the fall. The facility became concerned and sent her back to the ED.   In the ED, CT head and right hip were negative for new injury. ALF was unwilling to take patient back. TRH was called for admission  Assessment & Plan:   Principal Problem:   Adjustment disorder with depressed mood Active Problems:   Fall   Dehydration  Falls     - patient had denied CP, palpitations, dyspnea prior to fall     - CT right hip w/o acute fracture, but she reportedly complained of hip pain with inversion of right knee     - PT/OT recs for SNF, TOC following     - multiple falls over last several months; she had lived in an ALF  Hypothyroidism -Most recent TSH from 7/20 noted to be 5.320     - continue levothyroxine as pt tolerates  COPD Chronic hypercapnic and hypoxic respiratory failure     - Normally on 3L at baseline; currently [employer]     - continue home bronchodilator as tolerated, no audible wheezing this AM  Acute on CKD3b     - Cr up to 1.34 at time of presentation from 1.08 on 11/4 -There are concerns for dehydration at time of presentation, likely secondary to poor PO intake -Renal function has normalized with IVF -See below. Pt remains with minimal oral intake.  Will continue basal IVF  HLD     - continue lipitor, zetia as tolerated -recheck cmp in AM  Depression/Anxiety -Pt recently reported feeling more depressed, was seen by Psychiatry -Psychiatry recommendation to decrease wellbutrin to $RemoveBefor'300mg'UeplZELejuxu$  daily and increase zoloft to $RemoveB'100mg'xuZzIcAy$  -Recommend outpatient psychiatry follow up at time of d/c  DM2 w/ neuropathy     - SSI, DM diet, glucose checks, A1c     - continue neurontin as tolerated -Pain stable at this time  Hx of PE     - continue xarelto as tolerated -remains without evidence of acute blood loss  HTN     -continue with coreg as tolerated  Dehydration -Per above, concerns for ARF with clinical dehydration and observed poor PO intake -Continue on IVF hydration per above -Appreciate input by dietitian. Noted to have inadequate oral intake evidenced by meal completion <25%. Recommendation for ensure BID -See below, have consulted Palliative Care. Will need to address if pt is agreeable to have PEG  Dementia -Family reports gradual decline prior to admit -Currently more confused than baseline with anorexia -Discussed with POA who has agreed with meeting with Palliative Care to go over pt's goals of care and if pt's wishes include alternative means of nutrition  Hx Atrial fibrillation -On tele, noted to be in rate controlled  afib -Cont on anticoagulation as tolerated  Acquired thrombophilia  -On chronic anticoagulation  UPDATE: This afternoon, pt noted to be more lethargic. Rapid response called. Pt can be aroused but remains lethargic. Vitals are stable. Glucose normal. Have ordered STAT CT head, pending On tele, noted to be in rate controlled afib Will move pt to met-tele for closer monitoring  DVT prophylaxis: Xarelto Code Status: Full Family Communication: Pt in room, family not at bedside  Status is: Inpatient  Remains inpatient appropriate because:Unsafe d/c plan and IV treatments appropriate due to intensity of  illness or inability to take PO   Dispo: The patient is from: Home              Anticipated d/c is to: SNF              Anticipated d/c date is: 2 days              Patient currently is not medically stable to d/c.   Consultants:   Palliative Care  Procedures:     Antimicrobials: Anti-infectives (From admission, onward)   None      Subjective: Confused, unable to assess  Objective: Vitals:   07/15/20 2204 07/16/20 0545 07/16/20 1350 07/16/20 1353  BP: (!) 152/84 (!) 172/93  (!) 154/84  Pulse: 74 86  81  Resp: $Remo'18 18  18  'RSWQc$ Temp: 98.1 F (36.7 C) 98.2 F (36.8 C) 98.8 F (37.1 C) 98.8 F (37.1 C)  TempSrc: Oral Oral Axillary   SpO2: 95% 100%  99%  Weight:      Height:        Intake/Output Summary (Last 24 hours) at 07/16/2020 1448 Last data filed at 07/16/2020 1300 Gross per 24 hour  Intake 1905.3 ml  Output 2450 ml  Net -544.7 ml   Filed Weights   07/11/20 1558 07/12/20 0528  Weight: 83.9 kg 84.3 kg    Examination: General exam:asleep, in no acute distress Respiratory system: normal chest rise, clear, no audible wheezing Cardiovascular system: regular rhythm, s1-s2 Gastrointestinal system: Nondistended, nontender, pos BS Central nervous system: No seizures, no tremors Extremities: No cyanosis, no joint deformities Skin: No rashes, no pallor Psychiatry: difficult to assess  Data Reviewed: I have personally reviewed following labs and imaging studies  CBC: Recent Labs  Lab 07/09/20 1641 07/11/20 1428 07/12/20 0459 07/13/20 0642  WBC 8.8 11.6* 9.1 9.7  NEUTROABS 6.5 9.3*  --   --   HGB 11.8* 12.8 12.6 12.1  HCT 38.6 41.6 41.2 39.7  MCV 99.5 97.4 97.9 98.5  PLT 172 175 174 664   Basic Metabolic Panel: Recent Labs  Lab 07/12/20 0459 07/13/20 0642 07/14/20 0619 07/15/20 0457 07/16/20 0518  NA 140 140 137 137 137  K 4.1 4.1 4.1 3.6 4.2  CL 101 101 100 102 103  CO2 $Re'30 27 27 27 26  'rfp$ GLUCOSE 103* 101* 104* 115* 104*  BUN $Re'23 22 16 17 18    'PIZ$ CREATININE 1.34* 1.11* 0.91 0.87 0.89  CALCIUM 9.1 8.7* 8.6* 8.8* 9.3  MG  --   --   --   --  1.9   GFR: Estimated Creatinine Clearance: 52.8 mL/min (by C-G formula based on SCr of 0.89 mg/dL). Liver Function Tests: Recent Labs  Lab 07/12/20 0459 07/13/20 0642 07/14/20 0619 07/15/20 0457 07/16/20 0518  AST $Re'16 15 19 16 22  'gDC$ ALT $R'15 12 14 13 11  'Tz$ ALKPHOS 72 63 68 70 77  BILITOT 0.9 0.9 1.2 0.6 0.7  PROT 7.1  6.9 7.0 6.8 7.4  ALBUMIN 3.7 3.5 3.4* 3.2* 3.2*   No results for input(s): LIPASE, AMYLASE in the last 168 hours. No results for input(s): AMMONIA in the last 168 hours. Coagulation Profile: Recent Labs  Lab 07/09/20 1641  INR 1.7*   Cardiac Enzymes: No results for input(s): CKTOTAL, CKMB, CKMBINDEX, TROPONINI in the last 168 hours. BNP (last 3 results) No results for input(s): PROBNP in the last 8760 hours. HbA1C: No results for input(s): HGBA1C in the last 72 hours. CBG: Recent Labs  Lab 07/15/20 1135 07/15/20 1548 07/15/20 2056 07/16/20 0740 07/16/20 1136  GLUCAP 112* 118* 131* 121* 190*   Lipid Profile: No results for input(s): CHOL, HDL, LDLCALC, TRIG, CHOLHDL, LDLDIRECT in the last 72 hours. Thyroid Function Tests: No results for input(s): TSH, T4TOTAL, FREET4, T3FREE, THYROIDAB in the last 72 hours. Anemia Panel: No results for input(s): VITAMINB12, FOLATE, FERRITIN, TIBC, IRON, RETICCTPCT in the last 72 hours. Sepsis Labs: No results for input(s): PROCALCITON, LATICACIDVEN in the last 168 hours.  Recent Results (from the past 240 hour(s))  Urine culture     Status: Abnormal   Collection Time: 07/09/20  9:11 PM   Specimen: Urine, Clean Catch  Result Value Ref Range Status   Specimen Description   Final    URINE, CLEAN CATCH Performed at [facility], [place] [address] [place], [place] [address]    Special Requests   Final    NONE Performed at [facility], [place] [address] [place],  [address]     Culture MULTIPLE SPECIES PRESENT, SUGGEST RECOLLECTION (A)  Final   Report Status 07/11/2020 FINAL  Final  Respiratory Panel by RT PCR (Flu A&B, Covid) - Nasopharyngeal Swab     Status: None   Collection Time: 07/11/20  2:47 PM   Specimen: Nasopharyngeal Swab  Result Value Ref Range Status   SARS Coronavirus 2 by RT PCR NEGATIVE NEGATIVE Final    Comment: (NOTE) SARS-CoV-2 target nucleic acids are NOT DETECTED.  The SARS-CoV-2 RNA is generally detectable in upper respiratoy specimens during the acute phase of infection. The lowest concentration of SARS-CoV-2 viral copies this assay can detect is 131 copies/mL. A negative result does not preclude SARS-Cov-2 infection and should not be used as the sole basis for treatment or other patient management decisions. A negative result may occur with  improper specimen collection/handling, submission of specimen other than nasopharyngeal swab, presence of viral mutation(s) within the areas targeted by this assay, and inadequate number of viral copies (<131 copies/mL). A negative result must be combined with clinical observations, patient history, and epidemiological information. The expected result is Negative.  Fact Sheet for Patients:  PinkCheek.be  Fact Sheet for Healthcare Providers:  GravelBags.it  This test is no t yet approved or cleared by the [place] FDA and  has been authorized for detection and/or diagnosis of SARS-CoV-2 by FDA under an Emergency Use Authorization (EUA). This EUA will remain  in effect (meaning this test can be used) for the duration of the COVID-19 declaration under Section 564(b)(1) of the Act, 21 U.S.C. section 360bbb-3(b)(1), unless the authorization is terminated or revoked sooner.     Influenza A by PCR NEGATIVE NEGATIVE Final   Influenza B by PCR NEGATIVE NEGATIVE Final    Comment: (NOTE) The Xpert Xpress SARS-CoV-2/FLU/RSV assay is  intended as an aid in  the diagnosis of influenza from Nasopharyngeal swab specimens and  should not be used as a sole basis for treatment. Nasal  washings and  aspirates are unacceptable for Xpert Xpress SARS-CoV-2/FLU/RSV  testing.  Fact Sheet for Patients: PinkCheek.be  Fact Sheet for Healthcare Providers: GravelBags.it  This test is not yet approved or cleared by the [place] FDA and  has been authorized for detection and/or diagnosis of SARS-CoV-2 by  FDA under an Emergency Use Authorization (EUA). This EUA will remain  in effect (meaning this test can be used) for the duration of the  Covid-19 declaration under Section 564(b)(1) of the Act, 21  U.S.C. section 360bbb-3(b)(1), unless the authorization is  terminated or revoked. Performed at [facility], [place] [address] [place], [place] [address]      Radiology Studies: No results found.  Scheduled Meds: . amLODipine  5 mg Oral Daily  . atorvastatin  10 mg Oral QHS  . buPROPion  300 mg Oral Daily  . carvedilol  25 mg Oral BID WC  . docusate sodium  100 mg Oral Daily  . ezetimibe  10 mg Oral Daily  . feeding supplement  237 mL Oral BID BM  . gabapentin  300 mg Oral BID WC   And  . gabapentin  600 mg Oral QHS  . insulin aspart  0-15 Units Subcutaneous TID WC  . insulin aspart  0-5 Units Subcutaneous QHS  . levothyroxine  88 mcg Oral QAC breakfast  . mouth rinse  15 mL Mouth Rinse BID  . multivitamin with minerals  1 tablet Oral Daily  . rivaroxaban  20 mg Oral Q supper  . sertraline  100 mg Oral Daily   Continuous Infusions: . sodium chloride 50 mL/hr at 07/16/20 0913     LOS: 4 days   Marylu Lund, MD [facility] Pager On Amion  If 7PM-7AM, please contact night-coverage 07/16/2020, 2:48 PM

## 2020-07-16 NOTE — Progress Notes (Signed)
Occupational Therapy Treatment Patient Details Name: Dawn Morrow MRN: [id number] DOB: 15-Mar-1936 Today's Date: 07/16/2020    History of present illness Dawn Morrow is a 84 y.o. female with medical history significant of dementia, frequent falls. Presenting after unwitnessed fall at her ALF. Patient is a poor historian d/t mentation. Hx of EDP report. She had a recent visit to the ED for the same issues a few days ago. Per ED report, she was sent home with HHPT/OT. She was in a session today and apparently she was unable to bear weight on her leg during the session. She was allowed to sit in her wheelchair and was left unattended temporarily. At was at that time that she fell out of her chair. She was found on the floor shortly after. The patient does not remember the fall. The facility became concerned and sent her back to the ED.   OT comments  Very limited session. Unable to arouse pt. Nsg state pt had neurotin this am @ 0800. Significant change from prior session and nsg states pt was talking this am. BP 118/99; SpO2 93 RA; HR 83. Nsg made aware. NT came in to take blood sugar which was 113. Pt began shaking and NT took BP which was elevated 170/149; RR 20. Rapid resonse called. Will follow later date.  Follow Up Recommendations  SNF;Supervision/Assistance - 24 hour    Equipment Recommendations  None recommended by OT    Recommendations for Other Services      Precautions / Restrictions Precautions Precautions: Fall       Mobility Bed Mobility               General bed mobility comments: no initiation; total A with  any attempt  Transfers                 General transfer comment: will need [place]    Balance                                           ADL either performed or assessed with clinical judgement   ADL                                         General ADL Comments: total A     Vision       Perception      Praxis      Cognition Arousal/Alertness: Lethargic Behavior During Therapy: Flat affect Overall Cognitive Status: Impaired/Different from baseline                                 General Comments: not following any commands; decreased level of arousal        Exercises     Shoulder Instructions       General Comments increased lethargy adn decreased arousal since last session    Pertinent Vitals/ Pain       Pain Assessment: Faces Faces Pain Scale: Hurts little more Pain Location: grimaces with any ROM Pain Descriptors / Indicators: Grimacing;Guarding Pain Intervention(s): Limited activity within patient's tolerance  Home Living  Prior Functioning/Environment              Frequency  Min 2X/week        Progress Toward Goals  OT Goals(current goals can now be found in the care plan section)  Progress towards OT goals: Not progressing toward goals - comment (decreased level of arousal)  Acute Rehab OT Goals OT Goal Formulation: Patient unable to participate in goal setting Time For Goal Achievement: 07/26/20 Potential to Achieve Goals: Fair ADL Goals Pt Will Perform Eating: with set-up;sitting Pt Will Perform Grooming: with set-up;sitting Pt Will Perform Upper Body Bathing: with set-up;sitting Pt Will Perform Lower Body Bathing: with max assist;sit to/from stand Pt Will Perform Upper Body Dressing: with set-up;with min assist Pt Will Perform Lower Body Dressing: with max assist;sit to/from stand Pt Will Transfer to Toilet: with min assist;stand pivot transfer;bedside commode Pt Will Perform Toileting - Clothing Manipulation and hygiene: with max assist;sit to/from stand  Plan Discharge plan remains appropriate    Co-evaluation                 AM-PAC OT "6 Clicks" Daily Activity     Outcome Measure   Help from another person eating meals?: Total Help from another person  taking care of personal grooming?: Total Help from another person toileting, which includes using toliet, bedpan, or urinal?: Total Help from another person bathing (including washing, rinsing, drying)?: Total Help from another person to put on and taking off regular upper body clothing?: Total Help from another person to put on and taking off regular lower body clothing?: Total 6 Click Score: 6    End of Session    OT Visit Diagnosis: Pain;Other symptoms and signs involving cognitive function;Muscle weakness (generalized) (M62.81);Other abnormalities of gait and mobility (R26.89) Pain - part of body:  (generalized)   Activity Tolerance Patient limited by lethargy   Patient Left in bed;with call bell/phone within reach;with bed alarm set   Nurse Communication Other (comment) (decreased level of arousal)        Time: 2778-2423 OT Time Calculation (min): 15 min  Charges: OT General Charges $OT Visit: 1 Visit OT Treatments $Self Care/Home Management : 8-22 mins  Maurie Boettcher, OT/L   Acute OT Clinical Specialist [place] Pager [phone] Office [phone]    [facility] 07/16/2020, 3:46 PM

## 2020-07-16 NOTE — Significant Event (Signed)
Rapid Response Event Note   Reason for Call :  New onset altered mental status- unresposive  Initial Focused Assessment:  Patient only awakens to noxious stimuli, unable to follow commands, drifts back to sleep when un-stimulated. New onset, per RN and NT patient has dementia baseline but has been alert enough to participate throughout the shift. CBG completed. MD at bedside for evaluation. Pulse irregular by palpation and on 5 lead rapid response monitor.  12 lead completed. New onset Afib noted on 12 lead. Has wet cough but lungs clear after cough. Patient was alert enough to tolerate breakfast this am. Unable to complete NIH due to AMS patient unable to cooperate with exam. Patient does move all four extremities when stimulated. Plan to get CT head.  HR 84 BP 109/63 (76) SPo2 92- 2L [place] (baseline O2) Afebrile  Interventions:  CBG- 113 12 Lead- New onset Afib MD paged- Kalman Jewels Transported to CT  Plan of Care:  CT head without contrast   MD Notified: Kalman Jewels Call Time: 1657 Arrival Time: 9038 End Time: 1645  Mervyn Skeeters, RN

## 2020-07-17 DIAGNOSIS — F4321 Adjustment disorder with depressed mood: Secondary | ICD-10-CM

## 2020-07-17 DIAGNOSIS — R531 Weakness: Secondary | ICD-10-CM

## 2020-07-17 DIAGNOSIS — F0390 Unspecified dementia without behavioral disturbance: Secondary | ICD-10-CM

## 2020-07-17 DIAGNOSIS — Z7189 Other specified counseling: Secondary | ICD-10-CM

## 2020-07-17 DIAGNOSIS — Z515 Encounter for palliative care: Secondary | ICD-10-CM

## 2020-07-17 DIAGNOSIS — I482 Chronic atrial fibrillation, unspecified: Secondary | ICD-10-CM

## 2020-07-17 DIAGNOSIS — I10 Essential (primary) hypertension: Secondary | ICD-10-CM

## 2020-07-17 LAB — GLUCOSE, CAPILLARY
Glucose-Capillary: 111 mg/dL — ABNORMAL HIGH (ref 70–99)
Glucose-Capillary: 102 mg/dL — ABNORMAL HIGH (ref 70–99)
Glucose-Capillary: 153 mg/dL — ABNORMAL HIGH (ref 70–99)
Glucose-Capillary: 98 mg/dL (ref 70–99)
Glucose-Capillary: 154 mg/dL — ABNORMAL HIGH (ref 70–99)

## 2020-07-17 NOTE — Progress Notes (Signed)
PROGRESS NOTE    Dawn Morrow  YHC:[id number] DOB: March 11, 1936 DOA: 07/11/2020 PCP: System, Provider Not In    Brief Narrative:  84 y.o. female with medical history significant of dementia, frequent falls. Presenting after unwitnessed fall at her ALF. Patient is a poor historian d/t mentation. Hx of EDP report. She had a recent visit to the ED for the same issues a few days ago. Per ED report, she was sent home with HHPT/OT. She was in a session today and apparently she was unable to bear weight on her leg during the session. She was allowed to sit in her wheelchair and was left unattended temporarily. At was at that time that she fell out of her chair. She was found on the floor shortly after. The patient does not remember the fall. The facility became concerned and sent her back to the ED.   In the ED, CT head and right hip were negative for new injury. ALF was unwilling to take patient back. TRH was called for admission  Assessment & Plan:   Falls Likely mechanical falls.  Imaging study did not show any obvious injuries.  Seen by physical and Occupational Therapy.  SNF is recommended.  Apparently had lived in a ALF recently.  Transient Altered mental status Patient had an episode yesterday where she was noted to be poorly responsive.  Rapid response was called.  This was apparently thought to be new onset.  CT head was done which did not show any acute findings.  Patient seems to be back to baseline this morning.  Continue to monitor.  Hypothyroidism Most recent TSH from 7/20 noted to be 5.320. Continue levothyroxine as pt tolerates  COPD/Chronic hypercapnic and hypoxic respiratory failure Normally on 3L at baseline; currently [facility] Continue home bronchodilator as tolerated, no audible wheezing this AM  Acute on CKD3b Cr up to 1.34 at time of presentation from 1.08 on 11/4 -There was concerns for dehydration at time of presentation, likely secondary to poor PO intake -Renal  function has normalized with IVF  Hyperlipidemia Continue Lipitor Zetia.   LFTs have been normal.  Depression/Anxiety -Pt recently reported feeling more depressed, was seen by Psychiatry -Psychiatry recommendation to decrease wellbutrin to 300mg  daily and increase zoloft to 100mg  -Recommend outpatient psychiatry follow up at time of d/c  DM2 w/ neuropathy HbA1c 5.1.  Continue SSI.   History of pulmonary embolism Continue rivaroxaban.  Essential hypertension Continue carvedilol.  Blood pressure noted to be reasonably well controlled.   Dehydration Resolved with IV fluids.  Likely contributed by poor oral intake.  Oral intake seems to be better.    Dementia Family reports gradual decline prior to admit.  Seems to be having a waxing and waning mental status.  Seems to be at baseline this morning.  Palliative care was consulted.  Hx Atrial fibrillation -On tele, noted to be in rate controlled afib -Cont on anticoagulation as tolerated  Acquired thrombophilia  -On chronic anticoagulation  DVT prophylaxis: Xarelto Code Status: Full Family Communication: Family at bedside  Status is: Inpatient  Remains inpatient appropriate because:Unsafe d/c plan and IV treatments appropriate due to intensity of illness or inability to take PO   Dispo: The patient is from: Home              Anticipated d/c is to: SNF              Anticipated d/c date is: 2 days  Patient currently is not medically stable to d/c.   Consultants:   Palliative Care  Procedures:   None  Antimicrobials: Anti-infectives (From admission, onward)   None      Subjective: Remains pleasantly confused.  No distress.  No complaints offered.  Objective: Vitals:   07/17/20 0200 07/17/20 0300 07/17/20 0631 07/17/20 0821  BP:   (!) 115/56 119/61  Pulse:   79 79  Resp: (!) 21 20 18 20   Temp:   98.4 F (36.9 C) 98.6 F (37 C)  TempSrc:   Oral Axillary  SpO2:   95% 93%  Weight:        Height:        Intake/Output Summary (Last 24 hours) at 07/17/2020 1144 Last data filed at 07/17/2020 0847 Gross per 24 hour  Intake 1060.07 ml  Output 50 ml  Net 1010.07 ml   Filed Weights   07/11/20 1558 07/12/20 0528  Weight: 83.9 kg 84.3 kg    Examination:   General appearance: Awake alert.  In no distress.  Pleasantly confused Resp: Clear to auscultation bilaterally.  Normal effort Cardio: S1-S2 is normal regular.  No S3-S4.  No rubs murmurs or bruit GI: Abdomen is soft.  Nontender nondistended.  Bowel sounds are present normal.  No masses organomegaly Extremities: No edema.  Generalized deconditioning noted. Neurologic:   No focal neurological deficits.    Data Reviewed: I have personally reviewed following labs and imaging studies  CBC: Recent Labs  Lab 07/11/20 1428 07/12/20 0459 07/13/20 0642  WBC 11.6* 9.1 9.7  NEUTROABS 9.3*  --   --   HGB 12.8 12.6 12.1  HCT 41.6 41.2 39.7  MCV 97.4 97.9 98.5  PLT 175 174 466   Basic Metabolic Panel: Recent Labs  Lab 07/12/20 0459 07/13/20 0642 07/14/20 0619 07/15/20 0457 07/16/20 0518  NA 140 140 137 137 137  K 4.1 4.1 4.1 3.6 4.2  CL 101 101 100 102 103  CO2 30 27 27 27 26   GLUCOSE 103* 101* 104* 115* 104*  BUN 23 22 16 17 18   CREATININE 1.34* 1.11* 0.91 0.87 0.89  CALCIUM 9.1 8.7* 8.6* 8.8* 9.3  MG  --   --   --   --  1.9   GFR: Estimated Creatinine Clearance: 52.8 mL/min (by C-G formula based on SCr of 0.89 mg/dL). Liver Function Tests: Recent Labs  Lab 07/12/20 0459 07/13/20 0642 07/14/20 0619 07/15/20 0457 07/16/20 0518  AST 16 15 19 16 22   ALT 15 12 14 13 11   ALKPHOS 72 63 68 70 77  BILITOT 0.9 0.9 1.2 0.6 0.7  PROT 7.1 6.9 7.0 6.8 7.4  ALBUMIN 3.7 3.5 3.4* 3.2* 3.2*   CBG: Recent Labs  Lab 07/16/20 1136 07/16/20 1543 07/16/20 2112 07/17/20 0741 07/17/20 1133  GLUCAP 190* 113* 111* 102* 153*     Recent Results (from the past 240 hour(s))  Urine culture     Status: Abnormal    Collection Time: 07/09/20  9:11 PM   Specimen: Urine, Clean Catch  Result Value Ref Range Status   Specimen Description   Final    URINE, CLEAN CATCH Performed at [facility], [place] [address] [place], [place] [address]    Special Requests   Final    NONE Performed at [facility], [place] [address], [place], [place] [address]    Culture MULTIPLE SPECIES PRESENT, SUGGEST RECOLLECTION (A)  Final   Report Status 07/11/2020 FINAL  Final  Respiratory Panel  by RT PCR (Flu A&B, Covid) - Nasopharyngeal Swab     Status: None   Collection Time: 07/11/20  2:47 PM   Specimen: Nasopharyngeal Swab  Result Value Ref Range Status   SARS Coronavirus 2 by RT PCR NEGATIVE NEGATIVE Final    Comment: (NOTE) SARS-CoV-2 target nucleic acids are NOT DETECTED.  The SARS-CoV-2 RNA is generally detectable in upper respiratoy specimens during the acute phase of infection. The lowest concentration of SARS-CoV-2 viral copies this assay can detect is 131 copies/mL. A negative result does not preclude SARS-Cov-2 infection and should not be used as the sole basis for treatment or other patient management decisions. A negative result may occur with  improper specimen collection/handling, submission of specimen other than nasopharyngeal swab, presence of viral mutation(s) within the areas targeted by this assay, and inadequate number of viral copies (<131 copies/mL). A negative result must be combined with clinical observations, patient history, and epidemiological information. The expected result is Negative.  Fact Sheet for Patients:  PinkCheek.be  Fact Sheet for Healthcare Providers:  GravelBags.it  This test is no t yet approved or cleared by the [place] FDA and  has been authorized for detection and/or diagnosis of SARS-CoV-2 by FDA under an Emergency Use Authorization (EUA). This EUA will remain  in  effect (meaning this test can be used) for the duration of the COVID-19 declaration under Section 564(b)(1) of the Act, 21 U.S.C. section 360bbb-3(b)(1), unless the authorization is terminated or revoked sooner.     Influenza A by PCR NEGATIVE NEGATIVE Final   Influenza B by PCR NEGATIVE NEGATIVE Final    Comment: (NOTE) The Xpert Xpress SARS-CoV-2/FLU/RSV assay is intended as an aid in  the diagnosis of influenza from Nasopharyngeal swab specimens and  should not be used as a sole basis for treatment. Nasal washings and  aspirates are unacceptable for Xpert Xpress SARS-CoV-2/FLU/RSV  testing.  Fact Sheet for Patients: PinkCheek.be  Fact Sheet for Healthcare Providers: GravelBags.it  This test is not yet approved or cleared by the [place] FDA and  has been authorized for detection and/or diagnosis of SARS-CoV-2 by  FDA under an Emergency Use Authorization (EUA). This EUA will remain  in effect (meaning this test can be used) for the duration of the  Covid-19 declaration under Section 564(b)(1) of the Act, 21  U.S.C. section 360bbb-3(b)(1), unless the authorization is  terminated or revoked. Performed at [facility], [place] [address] [place], [place] [address]      Radiology Studies: CT HEAD WO CONTRAST  Result Date: 07/16/2020 CLINICAL DATA:  84 year old female with altered mental status. Neurologic deficit. EXAM: CT HEAD WITHOUT CONTRAST TECHNIQUE: Contiguous axial images were obtained from the base of the skull through the vertex without intravenous contrast. COMPARISON:  Head CT 07/11/2020.  Brain MRI 12/16/2019. FINDINGS: Brain: Mild motion artifact today. No midline shift, mass effect, or evidence of intracranial mass lesion. Stable ventricle size and configuration. No ventriculomegaly. No acute intracranial hemorrhage identified. Advanced bilateral cerebral white matter hypodensity and  including involvement of the deep white matter capsules. This appears similar to the prior MRI. Associated chronic heterogeneity of the bilateral deep gray nuclei. Stable gray-white matter differentiation throughout the brain. No cortically based acute infarct identified. Vascular: Calcified atherosclerosis at the skull base. No suspicious intracranial vascular hyperdensity. Skull: Mild motion artifact. No acute osseous abnormality identified. Sinuses/Orbits: Stable mild left mastoid effusion. Possible small volume retained secretions in the nasopharynx. Left tympanic cavity, other paranasal sinuses and mastoids  remain clear. Other: Stable orbit and scalp soft tissues. IMPRESSION: 1. Mild motion artifact today. No acute or evolving infarct identified. Underlying severe chronic small vessel disease. 2. Stable mild left mastoid effusion, likely postinflammatory. Electronically Signed   By: Genevie Ann M.D.   On: 07/16/2020 16:57    Scheduled Meds: . amLODipine  5 mg Oral Daily  . atorvastatin  10 mg Oral QHS  . buPROPion  300 mg Oral Daily  . carvedilol  25 mg Oral BID WC  . docusate sodium  100 mg Oral Daily  . ezetimibe  10 mg Oral Daily  . feeding supplement  237 mL Oral BID BM  . gabapentin  300 mg Oral BID WC   And  . gabapentin  600 mg Oral QHS  . insulin aspart  0-15 Units Subcutaneous TID WC  . insulin aspart  0-5 Units Subcutaneous QHS  . levothyroxine  88 mcg Oral QAC breakfast  . mouth rinse  15 mL Mouth Rinse BID  . multivitamin with minerals  1 tablet Oral Daily  . rivaroxaban  20 mg Oral Q supper  . sertraline  100 mg Oral Daily   Continuous Infusions: . sodium chloride 50 mL/hr at 07/17/20 0259     LOS: 5 days   [name], MD [facility] Pager On Amion  If 7PM-7AM, please contact night-coverage 07/17/2020, 11:44 AM

## 2020-07-17 NOTE — Progress Notes (Signed)
PT Cancellation Note  Patient Details Name: Dawn Morrow MRN: [id number] DOB: 1935-10-04   Cancelled Treatment:    Reason Eval/Treat Not Completed: Fatigue/lethargy limiting ability to participate Pt did not awaken. Pt with head resting on bed rail so provided padding, and pt still did not awaken.  Palliative care pending.   Meriam Chojnowski,KATHrine E 07/17/2020, 12:04 PM Arlyce Dice, DPT Acute Rehabilitation Services Pager: [phone] Office: [phone]

## 2020-07-17 NOTE — TOC Progression Note (Signed)
Transition of Care [facility]) - Progression Note    Patient Details  Name: [name] MRN: [id number] Date of Birth: 1935-11-29  Transition of Care [facility]) CM/SW Contact  Purcell Mouton, RN Phone Number: 07/17/2020, 4:06 PM  Clinical Narrative:    Spoke with [facility] admission coordinator to check on bed placement. Admission coordinator states that no family member has come to sign paperwork.  Will need to call when pt is ready for discharge to check on a bed. Spoke with pt's niece [name] who asked to call her brother Wille Glaser. Joe states that he is not financially responsible for pt, but will sign paperwork if he is not made to pay the SNF bill.    Expected Discharge Plan: Skilled Nursing Facility Barriers to Discharge: Continued Medical Work up  Expected Discharge Plan and Services Expected Discharge Plan: [place] In-house Referral: Clinical Social Work Discharge Planning Services: CM Consult   Living arrangements for the past 2 months: [place]                                       Social Determinants of Health (SDOH) Interventions    Readmission Risk Interventions Readmission Risk Prevention Plan 10/03/2019  Transportation Screening Complete  PCP or Specialist Appt within 3-5 Days Complete  HRI or [place] Not Complete  HRI or Home Care Consult comments Patient at her baseline  Social Work Consult for [place] Planning/Counseling [place] Not Applicable  Medication Review Press photographer) Referral to Pharmacy  Some recent data might be hidden

## 2020-07-17 NOTE — Consult Note (Signed)
Consultation Note Date: 07/17/2020   Patient Name: Dawn Morrow  DOB: 1936-05-07  MRN: [id number]  Age / Sex: 84 y.o., female  PCP: System, Provider Not In Referring Physician: [name], MD  Reason for Consultation: Establishing goals of care  HPI/Patient Profile: 84 y.o. female   admitted on 07/11/2020    Clinical Assessment and Goals of Care: 84 year old lady from assisted living facility with serious illness of dementia with subacute progressive functional decline and frequent falls recently.  And admitted to hospital medicine service.  CT scan of head and right hip negative for new injury.  Patient admitted with mechanical fall seen by physical and Occupational Therapy skilled nursing facility recommended.  Patient has other underlying chronic medical conditions of chronic obstructive pulmonary disease, stage III chronic kidney disease dyslipidemia depression and anxiety.  She was seen and evaluated by psychiatry and had adjustment to her Wellbutrin and Zoloft.  She was given IV fluids briefly.  Off-and-on she has had a transient altered mental status.  Oral intake has also been suboptimal.  Palliative medicine consultation for broad goals of care discussions has been requested.  Patient is awake alert resting in bed.  She is attempting to take a few sips of juice.  She is able to state her name however does not know she is in the hospital.  The only thing that I was able to gather from my conversation with her is that she has a lot of books and that she really likes to read.  Call placed and discussed with patient's nephew Mr. Dawn Morrow as well as with patient's niece healthcare power of attorney agent [name].  Palliative medicine is specialized medical care for people living with serious illness. It focuses on providing relief from the symptoms and stress of a serious illness. The goal is to improve  quality of life for both the patient and the family.  Goals of care: Broad aims of medical therapy in relation to the patient's values and preferences. Our aim is to provide medical care aimed at enabling patients to achieve the goals that matter most to them, given the circumstances of their particular medical situation and their constraints.   Brief life review performed.  Patient is originally from [place] possibly has adult children over there.  However, several years ago, patient moved to this area to be closer to her sister who is now deceased, who was Mr. Dawn Morrow and Dawn Morrow's his mother.  Both the patient's niece and nephew are very involved in her care.  We discussed about patient's current functional and cognitive decline.  We discussed about progressively worsening dementia, stages of dementia.  Discussed about the issue of anorexia in detail.  Goals wishes and values attempted to be explored.  CODE STATUS discussions also undertaken.  Patient's niece Dawn Morrow states that the patient has completed advanced directives and has made it known several times in the past that she does not want artificial tubes, does not want to be kept alive by artificial means.,  We discussed about  DO NOT RESUSCITATE in detail.  Additionally also discussed about the lack of benefit of artificial feeding in the setting of progressive decline from a dementia standpoint.  Niece and nephew are aware and in agreement.  Continue comfort feeds to whatever extent possible.  Patient's niece states that she brought the patient's favorite food to the hospital a few days ago but the patient did not show any interest.  HCPOA Niece Dawn Morrow discussions undertaken.  CODE STATUS now modified to DO NOT RESUSCITATE/DO NOT INTUBATE.  No artificial feeding, careful hand assisted comfort feeding as recommended.  Skilled nursing facility with palliative care following is recommended. Thank you for the  consult.  Code Status/Advance Care Planning:  DNR    Symptom Management:   As above  Palliative Prophylaxis:   Delirium Protocol  Additional Recommendations (Limitations, Scope, Preferences):  No Artificial Feeding  Psycho-social/Spiritual:   Desire for further Chaplaincy support:yes  Additional Recommendations: Education on Hospice  Prognosis:   Unable to determine  Discharge Planning: [facility] for rehab with Palliative care service follow-up      Primary Diagnoses: Present on Admission: . Fall . Dehydration   I have reviewed the medical record, interviewed the patient and family, and examined the patient. The following aspects are pertinent.  Past Medical History:  Diagnosis Date  . Anxiety and depression   . Asthma   . Cataract   . COPD (chronic obstructive pulmonary disease) ([place])   . Diabetes mellitus   . Diverticulosis   . DVT (deep vein thrombosis) in pregnancy    hx x multiple on coumadin  . GERD (gastroesophageal reflux disease)   . Hemorrhoids   . Hiatal hernia   . Hyperlipidemia   . Hypertension   . Hypothyroidism   . On home oxygen therapy    "2L; 24/7" (09/17/2017)  . Osteoarthritis    h/o spinal stenosis by MRI 2009  . Osteopenia   . Pulmonary embolism (HCC)    x 2  . Stroke [facility])    [facility]  . Tubular adenoma of colon 07/2005   Social History   Socioeconomic History  . Marital status: Divorced    Spouse name: Not on file  . Number of children: 3  . Years of education: Not on file  . Highest education level: Not on file  Occupational History  . Occupation: retired    Fish farm manager: UNEMPLOYED  Tobacco Use  . Smoking status: Former Smoker    Types: Cigarettes  . Smokeless tobacco: Never Used  Vaping Use  . Vaping Use: Never used  Substance and Sexual Activity  . Alcohol use: No    Comment: socially  . Drug use: No  . Sexual activity: Not on file  Other Topics Concern  . Not on file  Social History  Narrative   ** Merged History Encounter **       Single,   lost her sister in 2012. 3 children , all live in [place] to [place] late 10-2013 Nephew Dawn Morrow, lives in [place]; niece Dawn Morrow in [place] [phone], [phone]) Doesn't drive     Social Determinants of Health   Financial Resource Strain:   . Difficulty of Paying Living Expenses: Not on file  Food Insecurity:   . Worried About Charity fundraiser in the Last Year: Not on file  . Ran Out of Food in the Last Year: Not on file  Transportation Needs:   . Lack of  Transportation (Medical): Not on file  . Lack of Transportation (Non-Medical): Not on file  Physical Activity:   . Days of Exercise per Week: Not on file  . Minutes of Exercise per Session: Not on file  Stress:   . Feeling of Stress : Not on file  Social Connections:   . Frequency of Communication with Friends and Family: Not on file  . Frequency of Social Gatherings with Friends and Family: Not on file  . Attends Religious Services: Not on file  . Active Member of Clubs or Organizations: Not on file  . Attends Archivist Meetings: Not on file  . Marital Status: Not on file   Family History  Adopted: Yes  Problem Relation Age of Onset  . Cancer Mother        ? colon or ovarian  . Diabetes Mother   . Cancer Brother        ?  . CAD Neg Hx    Scheduled Meds: . amLODipine  5 mg Oral Daily  . atorvastatin  10 mg Oral QHS  . buPROPion  300 mg Oral Daily  . carvedilol  25 mg Oral BID WC  . docusate sodium  100 mg Oral Daily  . ezetimibe  10 mg Oral Daily  . feeding supplement  237 mL Oral BID BM  . gabapentin  300 mg Oral BID WC   And  . gabapentin  600 mg Oral QHS  . insulin aspart  0-15 Units Subcutaneous TID WC  . levothyroxine  88 mcg Oral QAC breakfast  . mouth rinse  15 mL Mouth Rinse BID  . multivitamin with minerals  1 tablet Oral Daily  . rivaroxaban  20 mg Oral Q supper  . sertraline  100 mg Oral Daily   Continuous  Infusions: PRN Meds:.acetaminophen **OR** acetaminophen, albuterol, LORazepam, meclizine, ondansetron **OR** ondansetron (ZOFRAN) IV, oxyCODONE-acetaminophen, polyethylene glycol Medications Prior to Admission:  Prior to Admission medications   Medication Sig Start Date End Date Taking? Authorizing Provider  acetaminophen (TYLENOL) 500 MG tablet Take 500 mg by mouth every 4 (four) hours as needed (pain).    Yes [provider]  albuterol (PROVENTIL HFA;VENTOLIN HFA) 108 (90 BASE) MCG/ACT inhaler Inhale 2 puffs into the lungs every 6 (six) hours as needed for wheezing or shortness of breath. 08/05/15  Yes Saguier, Percell Miller, PA-C  atorvastatin (LIPITOR) 10 MG tablet Take 1 tablet (10 mg total) by mouth at bedtime. 10/21/15  Yes Paz, Alda Berthold, MD  buPROPion HCl ER, XL, 450 MG TB24 Take 450 mg by mouth daily.  11/20/19  Yes [provider]  docusate sodium (COLACE) 100 MG capsule Take 100 mg by mouth daily.   Yes [provider]  ezetimibe (ZETIA) 10 MG tablet Take 10 mg by mouth daily.   Yes [provider]  gabapentin (NEURONTIN) 300 MG capsule Take 1 cap in AM, 1 cap at noon, 2 caps at bedtime Patient taking differently: Take 300-600 mg by mouth at bedtime. Take 1 capsule (300 mg) BID and Take 2 capsules (600 mg) at bedtime 12/21/14  Yes Cameron Sprang, MD  Insulin Glargine [facility]) 100 UNIT/ML Inject 7 Units into the skin at bedtime.   Yes [provider]  ketoconazole (NIZORAL) 2 % shampoo Apply 1 application topically See admin instructions. Use topically to shampoo hair twice weekly - Tuesday and Friday   Yes [provider]  levothyroxine (SYNTHROID) 88 MCG tablet Take 88 mcg by mouth daily before breakfast.  Yes [provider]  linagliptin (TRADJENTA) 5 MG TABS tablet Take 1 tablet (5 mg total) by mouth at bedtime. 07/15/16  Yes Paz, Alda Berthold, MD  LORazepam (ATIVAN) 0.5 MG tablet Take 1 tablet (0.5 mg total) by mouth every 8  (eight) hours as needed for anxiety. 05/06/20  Yes Dessa Phi, DO  meclizine (ANTIVERT) 25 MG tablet Take 25 mg by mouth at bedtime as needed for dizziness.   Yes [provider]  nystatin (NYSTATIN) powder Apply 1 application topically 2 (two) times daily.   Yes [provider]  ondansetron (ZOFRAN) 4 MG tablet Take 4 mg by mouth every 8 (eight) hours as needed for nausea or vomiting.   Yes [provider]  polyethylene glycol (MIRALAX / GLYCOLAX) 17 g packet Take 17 g by mouth daily as needed for mild constipation or moderate constipation.   Yes [provider]  Propylene Glycol (SYSTANE BALANCE) 0.6 % SOLN Place 1 drop into both eyes 4 (four) times daily as needed (for dry eyes).    Yes [provider]  repaglinide (PRANDIN) 0.5 MG tablet Take 1 tablet (0.5 mg total) by mouth 3 (three) times daily before meals. Patient taking differently: Take 0.5 mg by mouth 3 (three) times daily before meals. Hold for skipped meals 01/14/16  Yes Paz, Alda Berthold, MD  sertraline (ZOLOFT) 50 MG tablet Take 75 mg by mouth daily.  11/20/19  Yes [provider]  carvedilol (COREG) 12.5 MG tablet Take 1 tablet (12.5 mg total) by mouth 2 (two) times daily with a meal. 05/06/20   Dessa Phi, DO  insulin aspart (NOVOLOG FLEXPEN) 100 UNIT/ML FlexPen Inject 0-12 Units into the skin See admin instructions. Inject 0-12 units subcutaneously four times daily - before meals and at bedtime - per sliding scale: CBG 0-150 0 units, 151-200 4 units, 201-250 6 units, 251-300 8 units, 301-350 10 units, 351-400 12 units, >400 12 units    [provider]  insulin glargine (LANTUS) 100 UNIT/ML injection Inject 0.07 mLs (7 Units total) into the skin daily. Patient not taking: Reported on 07/11/2020 05/06/20 09/26/20  Dessa Phi, DO  OXYGEN Inhale 3 L into the lungs continuous.    [provider]  OXYGEN Inhale 4 L/min into the lungs continuous.    [provider]  rivaroxaban (XARELTO) 20 MG TABS tablet Take 1 tablet (20 mg total) by mouth daily with supper. Patient taking differently: Take 20 mg by mouth every evening.  09/14/14   Colon Branch, MD  clindamycin (CLEOCIN) 300 MG capsule Take 1 capsule (300 mg total) by mouth 3 (three) times daily. Patient not taking: Reported on 07/02/2017 06/17/17 07/02/17  Colon Branch, MD   Allergies  Allergen Reactions  . Penicillins Anaphylaxis  . Codeine Other (See Comments)    Listed on MAR  . Penicillins Other (See Comments)    Listed on MAR  . Sulfonylureas Other (See Comments)    Unknown reaction  . Sulfonylureas Other (See Comments)    Listed on MAR  . Codeine Other (See Comments)    Unknown reaction  . Sulfonamide Derivatives Other (See Comments)    Unknown reaction   Review of Systems Confused Physical Exam Well-developed well-nourished lady resting in bed Awake but confused Follows some commands Regular work of breathing Does not have edema S1-S2 Abdomen not distended  Vital Signs: BP (!) 109/32 (BP Location: Left Arm)   Pulse 71   Temp 98.2 F (36.8 C) (Axillary)  Resp 20   Ht 5' 5.5" (1.664 m)   Wt 84.3 kg   SpO2 93%   BMI 30.46 kg/m  Pain Scale: 0-10 POSS *See Group Information*: 1-Acceptable,Awake and alert Pain Score: 0-No pain     SpO2: SpO2: 93 % O2 Device:SpO2: 93 % O2 Flow Rate: .O2 Flow Rate (L/min): 2 L/min  IO: Intake/output summary:   Intake/Output Summary (Last 24 hours) at 07/17/2020 1524 Last data filed at 07/17/2020 0847 Gross per 24 hour  Intake 740 ml  Output --  Net 740 ml    LBM: Last BM Date:  (pt unable to remeber) Baseline Weight: Weight: 83.9 kg Most recent weight: Weight: 84.3 kg     Palliative Assessment/Data:   PPS 30%  Time In:  1400 Time Out:  1500 Time Total:  60 min.  Greater than 50%  of this time was spent counseling and coordinating care related to the above assessment and plan.  Signed by: Loistine Chance, MD   Please  contact Palliative Medicine Team phone at [phone] for questions and concerns.  For individual provider: See Shea Evans

## 2020-07-18 DIAGNOSIS — N179 Acute kidney failure, unspecified: Secondary | ICD-10-CM

## 2020-07-18 DIAGNOSIS — D72829 Elevated white blood cell count, unspecified: Secondary | ICD-10-CM

## 2020-07-18 LAB — URINALYSIS, ROUTINE W REFLEX MICROSCOPIC
Bilirubin Urine: NEGATIVE
Glucose, UA: NEGATIVE mg/dL
Ketones, ur: NEGATIVE mg/dL
Nitrite: NEGATIVE
Protein, ur: 100 mg/dL — AB
Specific Gravity, Urine: 1.017 (ref 1.005–1.030)
pH: 8 (ref 5.0–8.0)

## 2020-07-18 LAB — CBC
HCT: 38 % (ref 36.0–46.0)
Hemoglobin: 11.6 g/dL — ABNORMAL LOW (ref 12.0–15.0)
MCH: 30 pg (ref 26.0–34.0)
MCHC: 30.5 g/dL (ref 30.0–36.0)
MCV: 98.2 fL (ref 80.0–100.0)
Platelets: 166 10*3/uL (ref 150–400)
RBC: 3.87 MIL/uL (ref 3.87–5.11)
RDW: 16.4 % — ABNORMAL HIGH (ref 11.5–15.5)
WBC: 19.2 10*3/uL — ABNORMAL HIGH (ref 4.0–10.5)
nRBC: 0 % (ref 0.0–0.2)

## 2020-07-18 LAB — BASIC METABOLIC PANEL
Anion gap: 8 (ref 5–15)
BUN: 26 mg/dL — ABNORMAL HIGH (ref 8–23)
CO2: 24 mmol/L (ref 22–32)
Calcium: 8.4 mg/dL — ABNORMAL LOW (ref 8.9–10.3)
Chloride: 102 mmol/L (ref 98–111)
Creatinine, Ser: 1.29 mg/dL — ABNORMAL HIGH (ref 0.44–1.00)
GFR, Estimated: 41 mL/min — ABNORMAL LOW (ref >60)
Glucose, Bld: 126 mg/dL — ABNORMAL HIGH (ref 70–99)
Potassium: 3.6 mmol/L (ref 3.5–5.1)
Sodium: 134 mmol/L — ABNORMAL LOW (ref 135–145)

## 2020-07-18 LAB — GLUCOSE, CAPILLARY
Glucose-Capillary: 119 mg/dL — ABNORMAL HIGH (ref 70–99)
Glucose-Capillary: 136 mg/dL — ABNORMAL HIGH (ref 70–99)
Glucose-Capillary: 135 mg/dL — ABNORMAL HIGH (ref 70–99)

## 2020-07-18 MED ORDER — SODIUM CHLORIDE 0.45 % IV SOLN: INTRAVENOUS | Status: DC

## 2020-07-18 MED ORDER — CARVEDILOL 12.5 MG PO TABS
12.5000 mg | ORAL_TABLET | Freq: Two times a day (BID) | ORAL | Status: DC
  Administered 2020-07-18 – 2020-07-19 (×2): 12.5 mg via ORAL
  Filled 2020-07-18 (×2): qty 1

## 2020-07-18 NOTE — Care Management Important Message (Signed)
Important Message  Patient Details IM Letter given to the Patient Name: Dawn Morrow MRN: [id number] Date of Birth: 09/05/1936   Medicare Important Message Given:  Yes     Kerin Salen 07/18/2020, 10:10 AM

## 2020-07-18 NOTE — Progress Notes (Addendum)
PROGRESS NOTE    Dawn Morrow  CBJ:[id number] DOB: 12-25-1935 DOA: 07/11/2020 PCP: System, Provider Not In    Brief Narrative:  84 y.o. female with medical history significant of dementia, frequent falls. Presenting after unwitnessed fall at her ALF. Patient is a poor historian d/t mentation. Hx of EDP report. She had a recent visit to the ED for the same issues a few days ago. Per ED report, she was sent home with HHPT/OT. She was in a session today and apparently she was unable to bear weight on her leg during the session. She was allowed to sit in her wheelchair and was left unattended temporarily. At was at that time that she fell out of her chair. She was found on the floor shortly after. The patient does not remember the fall. The facility became concerned and sent her back to the ED.   In the ED, CT head and right hip were negative for new injury. ALF was unwilling to take patient back. TRH was called for admission  Assessment & Plan:  Acute on CKD3b Cr up to 1.34 at time of presentation from 1.08 on 11/4.  There was concern for dehydration.  Patient was given IV fluids.  Her creatinine improved.  Has had poor oral intake.  Creatinine noted to have climbed again to 1.29 today with a rise in BUN as well.  We will gently hydrate her.  Recheck labs tomorrow.  Monitor urine output.  Urine noted to be dark.  Leukocytosis Noted to have elevated WBC.  She is afebrile.  No obvious source of infection noted.  We will check a UA.  Recheck labs tomorrow.  Falls Likely mechanical falls.  Imaging study did not show any obvious injuries.  Seen by physical and Occupational Therapy.  SNF is recommended.  Apparently had lived in a ALF recently.  Transient Altered mental status Patient had an episode on 11/9 where she was noted to be poorly responsive.  Rapid response was called.  This was apparently thought to be new onset.  CT head was done which did not show any acute findings.   Patient seems to  be back to baseline.  Continue to monitor.    Hypothyroidism Most recent TSH from 7/20 noted to be 5.320. Continue levothyroxine as pt tolerates  COPD/Chronic hypercapnic and hypoxic respiratory failure Normally on 3L at baseline; currently [facility] Continue home bronchodilator as tolerated, no audible wheezing this AM  Hyperlipidemia Continue Lipitor Zetia.   LFTs have been normal.  Depression/Anxiety -Pt recently reported feeling more depressed, was seen by Psychiatry -Psychiatry recommendation to decrease wellbutrin to 300mg  daily and increase zoloft to 100mg  -Recommend outpatient psychiatry follow up at time of d/c  DM2 w/ neuropathy HbA1c 5.1.  Continue SSI.   History of pulmonary embolism Continue rivaroxaban.  Essential hypertension Continue carvedilol.  Occasional low readings noted.  Will decrease dose of carvedilol.  Stop amlodipine.  Dementia Family reports gradual decline prior to admit.   Hx Atrial fibrillation -On tele, noted to be in rate controlled afib -Cont on anticoagulation as tolerated  Acquired thrombophilia  -On chronic anticoagulation  Goals of care Palliative medicine has seen the patient.  Discussed with family.  CODE STATUS changed to DNR.  Palliative should follow the patient at skilled nursing facility.  DVT prophylaxis: Xarelto Code Status: Full Family Communication: No family at bedside. Disposition: Skilled nursing facility when medically stable.  Status is: Inpatient  Remains inpatient appropriate because:Unsafe d/c plan and IV treatments appropriate due  to intensity of illness or inability to take PO   Dispo: The patient is from: Home              Anticipated d/c is to: SNF              Anticipated d/c date is: 2 days              Patient currently is not medically stable to d/c.   Consultants:   Palliative Care  Procedures:   None  Antimicrobials: Anti-infectives (From admission, onward)   None       Subjective: Remains pleasantly confused.  Objective: Vitals:   07/17/20 1321 07/17/20 2156 07/18/20 0541 07/18/20 0850  BP: (!) 109/32 (!) 116/53 109/72 109/66  Pulse: 71 86 81 77  Resp: 20 20 18    Temp: 98.2 F (36.8 C) 97.8 F (36.6 C) 98 F (36.7 C)   TempSrc: Axillary  Oral   SpO2: 93% (!) 86% 90%   Weight:      Height:        Intake/Output Summary (Last 24 hours) at 07/18/2020 1048 Last data filed at 07/18/2020 0810 Gross per 24 hour  Intake 0 ml  Output 300 ml  Net -300 ml   Filed Weights   07/11/20 1558 07/12/20 0528  Weight: 83.9 kg 84.3 kg    Examination:  General appearance: Pleasantly confused. Resp: Clear to auscultation bilaterally.  Normal effort Cardio: S1-S2 is normal regular.  No S3-S4.  No rubs murmurs or bruit GI: Abdomen is soft.  Nontender nondistended.  Bowel sounds are present normal.  No masses organomegaly Extremities: No edema.   Neurologic:   No focal neurological deficits.       Data Reviewed: I have personally reviewed following labs and imaging studies  CBC: Recent Labs  Lab 07/11/20 1428 07/12/20 0459 07/13/20 0642 07/18/20 0446  WBC 11.6* 9.1 9.7 19.2*  NEUTROABS 9.3*  --   --   --   HGB 12.8 12.6 12.1 11.6*  HCT 41.6 41.2 39.7 38.0  MCV 97.4 97.9 98.5 98.2  PLT 175 174 172 500   Basic Metabolic Panel: Recent Labs  Lab 07/13/20 0642 07/14/20 0619 07/15/20 0457 07/16/20 0518 07/18/20 0446  NA 140 137 137 137 134*  K 4.1 4.1 3.6 4.2 3.6  CL 101 100 102 103 102  CO2 27 27 27 26 24   GLUCOSE 101* 104* 115* 104* 126*  BUN 22 16 17 18  26*  CREATININE 1.11* 0.91 0.87 0.89 1.29*  CALCIUM 8.7* 8.6* 8.8* 9.3 8.4*  MG  --   --   --  1.9  --    GFR: Estimated Creatinine Clearance: 36.4 mL/min (A) (by C-G formula based on SCr of 1.29 mg/dL (H)). Liver Function Tests: Recent Labs  Lab 07/12/20 0459 07/13/20 0642 07/14/20 0619 07/15/20 0457 07/16/20 0518  AST 16 15 19 16 22   ALT 15 12 14 13 11   ALKPHOS 72  63 68 70 77  BILITOT 0.9 0.9 1.2 0.6 0.7  PROT 7.1 6.9 7.0 6.8 7.4  ALBUMIN 3.7 3.5 3.4* 3.2* 3.2*   CBG: Recent Labs  Lab 07/16/20 2112 07/17/20 0741 07/17/20 1133 07/17/20 1717 07/17/20 2158  GLUCAP 111* 102* 153* 98 154*     Recent Results (from the past 240 hour(s))  Urine culture     Status: Abnormal   Collection Time: 07/09/20  9:11 PM   Specimen: Urine, Clean Catch  Result Value Ref Range Status   Specimen Description  Final    URINE, CLEAN CATCH Performed at [facility], [place] [address], [place], [place] [address]    Special Requests   Final    NONE Performed at [facility], [place] [address] [place], [place] [address]    Culture MULTIPLE SPECIES PRESENT, SUGGEST RECOLLECTION (A)  Final   Report Status 07/11/2020 FINAL  Final  Respiratory Panel by RT PCR (Flu A&B, Covid) - Nasopharyngeal Swab     Status: None   Collection Time: 07/11/20  2:47 PM   Specimen: Nasopharyngeal Swab  Result Value Ref Range Status   SARS Coronavirus 2 by RT PCR NEGATIVE NEGATIVE Final    Comment: (NOTE) SARS-CoV-2 target nucleic acids are NOT DETECTED.  The SARS-CoV-2 RNA is generally detectable in upper respiratoy specimens during the acute phase of infection. The lowest concentration of SARS-CoV-2 viral copies this assay can detect is 131 copies/mL. A negative result does not preclude SARS-Cov-2 infection and should not be used as the sole basis for treatment or other patient management decisions. A negative result may occur with  improper specimen collection/handling, submission of specimen other than nasopharyngeal swab, presence of viral mutation(s) within the areas targeted by this assay, and inadequate number of viral copies (<131 copies/mL). A negative result must be combined with clinical observations, patient history, and epidemiological information. The expected result is Negative.  Fact Sheet for Patients:   PinkCheek.be  Fact Sheet for Healthcare Providers:  GravelBags.it  This test is no t yet approved or cleared by the [place] FDA and  has been authorized for detection and/or diagnosis of SARS-CoV-2 by FDA under an Emergency Use Authorization (EUA). This EUA will remain  in effect (meaning this test can be used) for the duration of the COVID-19 declaration under Section 564(b)(1) of the Act, 21 U.S.C. section 360bbb-3(b)(1), unless the authorization is terminated or revoked sooner.     Influenza A by PCR NEGATIVE NEGATIVE Final   Influenza B by PCR NEGATIVE NEGATIVE Final    Comment: (NOTE) The Xpert Xpress SARS-CoV-2/FLU/RSV assay is intended as an aid in  the diagnosis of influenza from Nasopharyngeal swab specimens and  should not be used as a sole basis for treatment. Nasal washings and  aspirates are unacceptable for Xpert Xpress SARS-CoV-2/FLU/RSV  testing.  Fact Sheet for Patients: PinkCheek.be  Fact Sheet for Healthcare Providers: GravelBags.it  This test is not yet approved or cleared by the [place] FDA and  has been authorized for detection and/or diagnosis of SARS-CoV-2 by  FDA under an Emergency Use Authorization (EUA). This EUA will remain  in effect (meaning this test can be used) for the duration of the  Covid-19 declaration under Section 564(b)(1) of the Act, 21  U.S.C. section 360bbb-3(b)(1), unless the authorization is  terminated or revoked. Performed at [facility], [place] [address], [place], [place] [address]      Radiology Studies: CT HEAD WO CONTRAST  Result Date: 07/16/2020 CLINICAL DATA:  83 year old female with altered mental status. Neurologic deficit. EXAM: CT HEAD WITHOUT CONTRAST TECHNIQUE: Contiguous axial images were obtained from the base of the skull through the vertex without intravenous  contrast. COMPARISON:  Head CT 07/11/2020.  Brain MRI 12/16/2019. FINDINGS: Brain: Mild motion artifact today. No midline shift, mass effect, or evidence of intracranial mass lesion. Stable ventricle size and configuration. No ventriculomegaly. No acute intracranial hemorrhage identified. Advanced bilateral cerebral white matter hypodensity and including involvement of the deep white matter capsules. This appears similar to  the prior MRI. Associated chronic heterogeneity of the bilateral deep gray nuclei. Stable gray-white matter differentiation throughout the brain. No cortically based acute infarct identified. Vascular: Calcified atherosclerosis at the skull base. No suspicious intracranial vascular hyperdensity. Skull: Mild motion artifact. No acute osseous abnormality identified. Sinuses/Orbits: Stable mild left mastoid effusion. Possible small volume retained secretions in the nasopharynx. Left tympanic cavity, other paranasal sinuses and mastoids remain clear. Other: Stable orbit and scalp soft tissues. IMPRESSION: 1. Mild motion artifact today. No acute or evolving infarct identified. Underlying severe chronic small vessel disease. 2. Stable mild left mastoid effusion, likely postinflammatory. Electronically Signed   By: Genevie Ann M.D.   On: 07/16/2020 16:57    Scheduled Meds: . amLODipine  5 mg Oral Daily  . atorvastatin  10 mg Oral QHS  . buPROPion  300 mg Oral Daily  . carvedilol  25 mg Oral BID WC  . docusate sodium  100 mg Oral Daily  . ezetimibe  10 mg Oral Daily  . feeding supplement  237 mL Oral BID BM  . gabapentin  300 mg Oral BID WC   And  . gabapentin  600 mg Oral QHS  . insulin aspart  0-15 Units Subcutaneous TID WC  . levothyroxine  88 mcg Oral QAC breakfast  . mouth rinse  15 mL Mouth Rinse BID  . multivitamin with minerals  1 tablet Oral Daily  . rivaroxaban  20 mg Oral Q supper  . sertraline  100 mg Oral Daily   Continuous Infusions: . sodium chloride 75 mL/hr at 07/18/20  0851     LOS: 6 days   [name], MD [facility] Pager On Amion  If 7PM-7AM, please contact night-coverage 07/18/2020, 10:48 AM

## 2020-07-18 NOTE — Progress Notes (Signed)
Nutrition Follow-up  DOCUMENTATION CODES:   Non-severe (moderate) malnutrition in context of chronic illness  INTERVENTION:  Increase to Ensure Enlive po TID, each supplement provides 350 kcal and 20 grams of protein  Continue MVI daily  NUTRITION DIAGNOSIS:   Moderate Malnutrition related to chronic illness (dementia) as evidenced by mild muscle depletion, mild fat depletion, energy intake < 75% for > or equal to 1 month.  updated  GOAL:   Patient will meet greater than or equal to 90% of their needs  progressing  MONITOR:   PO intake, Supplement acceptance, Labs, Weight trends, Skin, I & O's  REASON FOR ASSESSMENT:   Consult Assessment of nutrition requirement/status  ASSESSMENT:   84 y/o female with h/o dementia, COPD, CKD III, HTN, HLD, hypothyroidism, DM, PE and recurrent falls who is admitted with fall and AKI  Pt to d/c to SNF for short-term rehab.   Pt is an unreliable historian; however, she reports having a very poor appetite for 1 year and claims that food "tastes like sand." Unable to obtain any specifics regarding what pt eats/drinks on a typical day. Discussed pt with RN and NT who report pt eating well and drinking supplements well (receives Ensure Enlive BID)  PO Intake: 15-80% x 5 recorded meals (42% average meal intake)  UOP: 1394ml x24 hours  Medications: colace, ss novolog, mvi Labs: CBGs 119-161  NUTRITION - FOCUSED PHYSICAL EXAM: limited due to pt complaining of pain    Most Recent Value  Orbital Region No depletion  Upper Arm Region Mild depletion  Thoracic and Lumbar Region No depletion  Buccal Region No depletion  Temple Region Mild depletion  Clavicle Bone Region No depletion  Clavicle and Acromion Bone Region Mild depletion  Scapular Bone Region No depletion  Dorsal Hand No depletion  Patellar Region No depletion  Anterior Thigh Region Mild depletion  Posterior Calf Region Mild depletion  Edema (RD Assessment) Mild  Hair  Reviewed  Eyes Reviewed  Mouth Reviewed  Skin Reviewed  Nails Reviewed       Diet Order:   Diet Order            Diet Carb Modified Fluid consistency: Thin; Room service appropriate? No  Diet effective now                 EDUCATION NEEDS:   No education needs have been identified at this time  Skin:  Skin Assessment: Reviewed RN Assessment  Last BM:  11/10  Height:   Ht Readings from Last 1 Encounters:  07/12/20 5' 5.5" (1.664 m)    Weight:   Wt Readings from Last 1 Encounters:  07/12/20 84.3 kg    Ideal Body Weight:  57.9 kg  BMI:  Body mass index is 30.46 kg/m.  Estimated Nutritional Needs:   Kcal:  1700-1900kcal/day  Protein:  85-95g/day  Fluid:  1.5-1.8L/day    Larkin Ina, MS, RD, LDN RD pager number and weekend/on-call pager number located in [place].

## 2020-07-18 NOTE — Plan of Care (Signed)
  Problem: Health Behavior/Discharge Planning: Goal: Ability to manage health-related needs will improve Outcome: Progressing   Problem: Clinical Measurements: Goal: Will remain free from infection Outcome: Progressing Goal: Diagnostic test results will improve Outcome: Progressing Goal: Respiratory complications will improve Outcome: Progressing   Problem: Activity: Goal: Risk for activity intolerance will decrease Outcome: Progressing   Problem: Nutrition: Goal: Adequate nutrition will be maintained Outcome: Progressing   Problem: Coping: Goal: Level of anxiety will decrease Outcome: Progressing   Problem: Elimination: Goal: Will not experience complications related to bowel motility Outcome: Progressing   Problem: Pain Managment: Goal: General experience of comfort will improve Outcome: Progressing   Problem: Safety: Goal: Ability to remain free from injury will improve Outcome: Progressing   Problem: Skin Integrity: Goal: Risk for impaired skin integrity will decrease Outcome: Progressing

## 2020-07-19 DIAGNOSIS — E440 Moderate protein-calorie malnutrition: Secondary | ICD-10-CM | POA: Insufficient documentation

## 2020-07-19 LAB — BASIC METABOLIC PANEL
Anion gap: 9 (ref 5–15)
BUN: 27 mg/dL — ABNORMAL HIGH (ref 8–23)
CO2: 24 mmol/L (ref 22–32)
Calcium: 8.5 mg/dL — ABNORMAL LOW (ref 8.9–10.3)
Chloride: 106 mmol/L (ref 98–111)
Creatinine, Ser: 1.17 mg/dL — ABNORMAL HIGH (ref 0.44–1.00)
GFR, Estimated: 47 mL/min — ABNORMAL LOW (ref >60)
Glucose, Bld: 120 mg/dL — ABNORMAL HIGH (ref 70–99)
Potassium: 3.8 mmol/L (ref 3.5–5.1)
Sodium: 139 mmol/L (ref 135–145)

## 2020-07-19 LAB — CBC
HCT: 35.8 % — ABNORMAL LOW (ref 36.0–46.0)
Hemoglobin: 11 g/dL — ABNORMAL LOW (ref 12.0–15.0)
MCH: 30 pg (ref 26.0–34.0)
MCHC: 30.7 g/dL (ref 30.0–36.0)
MCV: 97.5 fL (ref 80.0–100.0)
Platelets: 184 10*3/uL (ref 150–400)
RBC: 3.67 MIL/uL — ABNORMAL LOW (ref 3.87–5.11)
RDW: 16.3 % — ABNORMAL HIGH (ref 11.5–15.5)
WBC: 15.1 10*3/uL — ABNORMAL HIGH (ref 4.0–10.5)
nRBC: 0 % (ref 0.0–0.2)

## 2020-07-19 LAB — GLUCOSE, CAPILLARY
Glucose-Capillary: 116 mg/dL — ABNORMAL HIGH (ref 70–99)
Glucose-Capillary: 119 mg/dL — ABNORMAL HIGH (ref 70–99)
Glucose-Capillary: 161 mg/dL — ABNORMAL HIGH (ref 70–99)

## 2020-07-19 LAB — SARS CORONAVIRUS 2 BY RT PCR (HOSPITAL ORDER, PERFORMED IN ~~LOC~~ HOSPITAL LAB): SARS Coronavirus 2: NEGATIVE

## 2020-07-19 MED ORDER — ADULT MULTIVITAMIN W/MINERALS CH: 1.0000 | ORAL_TABLET | Freq: Every day | ORAL | Status: AC

## 2020-07-19 MED ORDER — BUPROPION HCL ER (XL) 300 MG PO TB24: 300.0000 mg | ORAL_TABLET | Freq: Every day | ORAL | Status: AC

## 2020-07-19 MED ORDER — ENSURE ENLIVE PO LIQD: 237.0000 mL | Freq: Three times a day (TID) | ORAL | Status: DC

## 2020-07-19 MED ORDER — BASAGLAR KWIKPEN 100 UNIT/ML ~~LOC~~ SOPN: 5.0000 [IU] | PEN_INJECTOR | Freq: Every day | SUBCUTANEOUS | Status: AC

## 2020-07-19 MED ORDER — LORAZEPAM 0.5 MG PO TABS: 0.5000 mg | ORAL_TABLET | Freq: Three times a day (TID) | ORAL | 0 refills | Status: DC | PRN

## 2020-07-19 MED ORDER — SERTRALINE HCL 100 MG PO TABS: 100.0000 mg | ORAL_TABLET | Freq: Every day | ORAL | Status: AC

## 2020-07-19 NOTE — Plan of Care (Signed)
  Problem: Clinical Measurements: Goal: Ability to maintain clinical measurements within normal limits will improve Outcome: Progressing Goal: Will remain free from infection Outcome: Progressing Goal: Diagnostic test results will improve Outcome: Progressing Goal: Respiratory complications will improve Outcome: Progressing Goal: Cardiovascular complication will be avoided Outcome: Progressing   Problem: Activity: Goal: Risk for activity intolerance will decrease Outcome: Progressing   Problem: Nutrition: Goal: Adequate nutrition will be maintained Outcome: Progressing   Problem: Coping: Goal: Level of anxiety will decrease Outcome: Progressing   Problem: Pain Managment: Goal: General experience of comfort will improve Outcome: Progressing   Problem: Safety: Goal: Ability to remain free from injury will improve Outcome: Progressing   Problem: Skin Integrity: Goal: Risk for impaired skin integrity will decrease Outcome: Progressing

## 2020-07-19 NOTE — TOC Progression Note (Addendum)
Transition of Care [facility]) - Progression Note    Patient Details  Name: Dawn Morrow MRN: [id number] Date of Birth: August 22, 1936  [facility] CM/SW Contact  Purcell Mouton, RN Phone Number: 07/19/2020, 11:05 AM  Clinical Narrative:     Spoke with [facility] admission coordinator, pt may transfer today. Need COVID test/SNF request. Nephew Joe was called to inform him that pt was discharging to [facility] today.   Expected Discharge Plan: [place] Barriers to Discharge: Continued Medical Work up  Expected Discharge Plan and Services Expected Discharge Plan: [place] In-house Referral: Clinical Social Work Discharge Planning Services: CM Consult   Living arrangements for the past 2 months: [place] Expected Discharge Date: 07/19/20                                     Social Determinants of Health (SDOH) Interventions    Readmission Risk Interventions Readmission Risk Prevention Plan 10/03/2019  Transportation Screening Complete  PCP or Specialist Appt within 3-5 Days Complete  HRI or [place] Not Complete  HRI or Home Care Consult comments Patient at her baseline  Social Work Consult for [place] Planning/Counseling [place] Not Applicable  Medication Review Press photographer) Referral to Pharmacy  Some recent data might be hidden

## 2020-07-19 NOTE — Progress Notes (Signed)
Occupational Therapy Treatment Patient Details Name: Dawn Morrow MRN: [id number] DOB: 09-Jun-1936 Today's Date: 07/19/2020    History of present illness Dawn Morrow is a 84 y.o. female with medical history significant of dementia, frequent falls. Presenting after unwitnessed fall at her ALF. Patient is a poor historian d/t mentation. Hx of EDP report. She had a recent visit to the ED for the same issues a few days ago. Per ED report, she was sent home with HHPT/OT. She was in a session today and apparently she was unable to bear weight on her leg during the session. She was allowed to sit in her wheelchair and was left unattended temporarily. At was at that time that she fell out of her chair. She was found on the floor shortly after. The patient does not remember the fall. The facility became concerned and sent her back to the ED.   OT comments  Patient much more alert and participatory today, does require repeated directions d/t HOH. Patient max A for bed mobility and requires mod cues for posture due to L lateral lean at EOB. Patient max A to power up to standing with rolling walker, unable to weight shift or pivot on LEs therefore OT pivot at hips to safely transfer into recliner. Notified RN x2 assist and utilize stedy for back to bed.   Follow Up Recommendations  SNF;Supervision/Assistance - 24 hour    Equipment Recommendations  None recommended by OT       Precautions / Restrictions Precautions Precautions: Fall       Mobility Bed Mobility Overal bed mobility: Needs Assistance Bed Mobility: Rolling;Sidelying to Sit Rolling: Mod assist Sidelying to sit: Max assist;HOB elevated       General bed mobility comments: max A to manage LEs off EOB and to elevate trunk to sitting   Transfers Overall transfer level: Needs assistance Equipment used: Rolling walker (2 wheeled) Transfers: Stand Pivot Transfers;Sit to/from Stand Sit to Stand: Max assist;From elevated surface Stand  pivot transfers: Max assist       General transfer comment: patient unable to pivot LEs with walker OT provide max A at hips/buttock to pivot patient into chair     Balance Overall balance assessment: History of Falls;Needs assistance Sitting-balance support: Bilateral upper extremity supported;Feet supported Sitting balance-Leahy Scale: Poor   Postural control: Left lateral lean Standing balance support: Bilateral upper extremity supported;During functional activity Standing balance-Leahy Scale: Zero Standing balance comment: max A and B UE support of walker                           ADL either performed or assessed with clinical judgement   ADL Overall ADL's : Needs assistance/impaired Eating/Feeding: Set up;Sitting Eating/Feeding Details (indicate cue type and reason): patient assisted with set up for lunch tray.                 Lower Body Dressing: Total assistance;Bed level Lower Body Dressing Details (indicate cue type and reason): total to don socks Toilet Transfer: Maximal assistance;Stand-pivot;Cueing for safety;Cueing for sequencing;BSC;RW           Functional mobility during ADLs: Maximal assistance;Rolling walker;Cueing for safety;Cueing for sequencing General ADL Comments: patient requiring significant assistance with transfer due to weakness, decreased balance, activity tolerance. patient unable to recall last time she was OOB               Cognition Arousal/Alertness: Awake/alert Behavior During Therapy: [facility] for tasks assessed/performed  Overall Cognitive Status: No family/caregiver present to determine baseline cognitive functioning Area of Impairment: Orientation;Memory;Safety/judgement                 Orientation Level: Disoriented to;Situation;Place   Memory: Decreased short-term memory   Safety/Judgement: Decreased awareness of safety     General Comments: following 1 step directions with increased time                    Pertinent Vitals/ Pain       Pain Assessment: Faces Faces Pain Scale: Hurts little more Pain Location: R LE with mobility Pain Descriptors / Indicators: Moaning;Grimacing Pain Intervention(s): Monitored during session         Frequency  Min 2X/week        Progress Toward Goals  OT Goals(current goals can now be found in the care plan section)  Progress towards OT goals: Progressing toward goals  Acute Rehab OT Goals OT Goal Formulation: With patient Time For Goal Achievement: 07/26/20 Potential to Achieve Goals: [place] Discharge plan remains appropriate       AM-PAC OT "6 Clicks" Daily Activity     Outcome Measure   Help from another person eating meals?: A Little Help from another person taking care of personal grooming?: A Little Help from another person toileting, which includes using toliet, bedpan, or urinal?: Total Help from another person bathing (including washing, rinsing, drying)?: A Lot Help from another person to put on and taking off regular upper body clothing?: A Lot Help from another person to put on and taking off regular lower body clothing?: Total 6 Click Score: 12    End of Session Equipment Utilized During Treatment: Rolling walker;Gait belt  OT Visit Diagnosis: Pain;Other symptoms and signs involving cognitive function;Muscle weakness (generalized) (M62.81);Other abnormalities of gait and mobility (R26.89) Pain - Right/Left: Right Pain - part of body: Leg   Activity Tolerance Patient tolerated treatment well   Patient Left in chair;with call bell/phone within reach;with chair alarm set   Nurse Communication Mobility status;Need for lift equipment;Other (comment) (stedy)        Time: 6808-8110 OT Time Calculation (min): 26 min  Charges: OT General Charges $OT Visit: 1 Visit OT Treatments $Self Care/Home Management : 23-37 mins  Delbert Phenix OT OT pager: [phone]   Rosemary Holms 07/19/2020, 12:18 PM

## 2020-07-19 NOTE — Discharge Summary (Addendum)
[facility]  Physician Discharge Summary   Patient ID: Dawn Morrow MRN: [id number] DOB/AGE: 1936/07/19 84 y.o.  Admit date: 07/11/2020 Discharge date: 07/19/2020  PCP: System, Provider Not In  DISCHARGE DIAGNOSES:  Acute on chronic kidney disease stage IIIb, improved Leukocytosis, likely reactive, improving Frequent falls at home without any injuries Hypothyroidism History of COPD Chronic hypercapnic and hypoxic respiratory failure on home oxygen Hyperlipidemia History of depression and anxiety Diabetes mellitus type 2 with neuropathy Pulmonary embolism on rivaroxaban Essential hypertension History of dementia History of atrial fibrillation on anticoagulation   RECOMMENDATIONS FOR OUTPATIENT FOLLOW UP: 1. Please check CBC and basic metabolic panel on Monday or Tuesday of next week 2. Palliative care to continue to follow the patient at skilled nursing facility 3. Monitor CBGs and consider discontinuing Basaglar if CBGs stay below 120.    Home Health: Patient going to skilled nursing facility for short-term rehab Equipment/Devices: None  CODE STATUS: DNR, no artificial feeding to be used  DISCHARGE CONDITION: fair  Diet recommendation: Modified carbohydrate  INITIAL HISTORY: 84 y.o.femalewith medical history significant ofdementia, frequent falls. Presenting after unwitnessed fall at her ALF. Patient is a poor historian d/t mentation. Hx of EDP report. She had a recent visit to the ED for the same issues a few days ago. Per ED report, she was sent home with HHPT/OT. She was in a session today and apparently she was unable to bear weight on her leg during the session. She was allowed to sit in her wheelchair and was left unattended temporarily. At was at that time that she fell out of her chair. She was found on the floor shortly after. The patient does not remember the fall. The facility became concerned and sent her back to the ED.  In the ED, CT head and  right hip were negative for new injury. ALF was unwilling to take patient back. [facility] was called for admission   Consultations:  Palliative care  Procedures:  None   HOSPITAL COURSE:   Acute on CKD3b Cr up to 1.34 at time of presentation from 1.08 on 11/4.  There was concern for dehydration.  Patient was given IV fluids.  Her creatinine improved.  Has had poor oral intake.  Creatinine noted to have climbed again to 1.29 on 11/11 with a rise in BUN as well.  She was gently hydrated.  Labs look better this morning.  She has had good urine output.  Continue to encourage oral intake at the skilled nursing facility.  Recheck labs early next week.  If she continues to have poor oral intake and continues to develop renal issues that may have to transition her to hospice.    Leukocytosis Noted to have elevated WBC.  Likely due to dehydration.  She was afebrile.  UA was noted to be abnormal.  She has previous history of UTI.  Appears to be a contaminated sample.  Patient denies any urinary discomfort.  Will not treat.  Could be colonized.  WBC is better today.  Recheck next week.  Falls Likely mechanical falls.  Imaging study did not show any obvious injuries.  Seen by physical and Occupational Therapy.  SNF is recommended.  Apparently had lived in a ALF recently.  Transient Altered mental status Patient had an episode on 11/9 where she was noted to be poorly responsive.  Rapid response was called.  This was apparently thought to be new onset.  CT head was done which did not show any acute findings.  Patient seems to be back to baseline.     Hypothyroidism Most recent TSH from 7/20 noted to be 5.320. Continue levothyroxine.  COPD/Chronic hypercapnic and hypoxic respiratory failure Normally on 3L at baseline.  Continue home bronchodilator as tolerated, no audible wheezing this AM  Hyperlipidemia Continue Lipitor Zetia.  LFTs have been normal.  Depression/Anxiety -Pt recently  reported feeling more depressed, was seen by Psychiatry -Psychiatry recommendation to decrease wellbutrin to 300mg  daily and increase zoloft to 100mg   DM2 w/ neuropathy HbA1c 5.1.  Continue SSI.  Discontinued most of her oral antidiabetic medications.  Decrease the dose of her long-acting insulin.  Monitor CBGs.  May need to discontinue the long-acting insulin as well.  History of pulmonary embolism Continue rivaroxaban.  Stable  Essential hypertension Continue carvedilol.  Patient was started on amlodipine and the dose of carvedilol was increased during this hospital stay due to elevated blood pressures which might have been due to agitation.  Now they seem to be back to baseline.  Amlodipine discontinued.  Dementia Family reports gradual decline prior to admit.   Hx persistent atrial fibrillation Continue rivaroxaban.  Heart rate is stable.  Continue beta-blocker.    Acquired thrombophilia  On chronic anticoagulation  Goals of care Palliative medicine has seen the patient.  Discussed with family.  CODE STATUS changed to DNR.  Palliative should follow the patient at skilled nursing facility.  Moderate malnutrition    Obesity Estimated body mass index is 30.46 kg/m as calculated from the following:   Height as of this encounter: 5' 5.5" (1.664 m).   Weight as of this encounter: 84.3 kg.   Overall patient remained stable.  She needs to go to skilled nursing facility for short-term rehab.  Okay for discharge today.   PERTINENT LABS:  The results of significant diagnostics from this hospitalization (including imaging, microbiology, ancillary and laboratory) are listed below for reference.    Microbiology: Recent Results (from the past 240 hour(s))  Urine culture     Status: Abnormal   Collection Time: 07/09/20  9:11 PM   Specimen: Urine, Clean Catch  Result Value Ref Range Status   Specimen Description   Final    URINE, CLEAN CATCH Performed at [facility], [place] [address] [place], [place] [address]    Special Requests   Final    NONE Performed at [facility], [place] [address] [place], [place] [address]    Culture MULTIPLE SPECIES PRESENT, SUGGEST RECOLLECTION (A)  Final   Report Status 07/11/2020 FINAL  Final  Respiratory Panel by RT PCR (Flu A&B, Covid) - Nasopharyngeal Swab     Status: None   Collection Time: 07/11/20  2:47 PM   Specimen: Nasopharyngeal Swab  Result Value Ref Range Status   SARS Coronavirus 2 by RT PCR NEGATIVE NEGATIVE Final    Comment: (NOTE) SARS-CoV-2 target nucleic acids are NOT DETECTED.  The SARS-CoV-2 RNA is generally detectable in upper respiratoy specimens during the acute phase of infection. The lowest concentration of SARS-CoV-2 viral copies this assay can detect is 131 copies/mL. A negative result does not preclude SARS-Cov-2 infection and should not be used as the sole basis for treatment or other patient management decisions. A negative result may occur with  improper specimen collection/handling, submission of specimen other than nasopharyngeal swab, presence of viral mutation(s) within the areas targeted by this assay, and inadequate number of viral copies (<131 copies/mL). A negative result must be combined with clinical observations, patient history, and  epidemiological information. The expected result is Negative.  Fact Sheet for Patients:  PinkCheek.be  Fact Sheet for Healthcare Providers:  GravelBags.it  This test is no t yet approved or cleared by the [place] FDA and  has been authorized for detection and/or diagnosis of SARS-CoV-2 by FDA under an Emergency Use Authorization (EUA). This EUA will remain  in effect (meaning this test can be used) for the duration of the COVID-19 declaration under Section 564(b)(1) of the Act, 21 U.S.C. section 360bbb-3(b)(1), unless the authorization is  terminated or revoked sooner.     Influenza A by PCR NEGATIVE NEGATIVE Final   Influenza B by PCR NEGATIVE NEGATIVE Final    Comment: (NOTE) The Xpert Xpress SARS-CoV-2/FLU/RSV assay is intended as an aid in  the diagnosis of influenza from Nasopharyngeal swab specimens and  should not be used as a sole basis for treatment. Nasal washings and  aspirates are unacceptable for Xpert Xpress SARS-CoV-2/FLU/RSV  testing.  Fact Sheet for Patients: PinkCheek.be  Fact Sheet for Healthcare Providers: GravelBags.it  This test is not yet approved or cleared by the [place] FDA and  has been authorized for detection and/or diagnosis of SARS-CoV-2 by  FDA under an Emergency Use Authorization (EUA). This EUA will remain  in effect (meaning this test can be used) for the duration of the  Covid-19 declaration under Section 564(b)(1) of the Act, 21  U.S.C. section 360bbb-3(b)(1), unless the authorization is  terminated or revoked. Performed at [facility], [place] [address], [place], [place] [address]   SARS Coronavirus 2 by RT PCR (hospital order, performed in [facility] hospital lab) Nasopharyngeal Nasopharyngeal Swab     Status: None   Collection Time: 07/19/20 11:07 AM   Specimen: Nasopharyngeal Swab  Result Value Ref Range Status   SARS Coronavirus 2 NEGATIVE NEGATIVE Final    Comment: (NOTE) SARS-CoV-2 target nucleic acids are NOT DETECTED.  The SARS-CoV-2 RNA is generally detectable in upper and lower respiratory specimens during the acute phase of infection. The lowest concentration of SARS-CoV-2 viral copies this assay can detect is 250 copies / mL. A negative result does not preclude SARS-CoV-2 infection and should not be used as the sole basis for treatment or other patient management decisions.  A negative result may occur with improper specimen collection / handling, submission of specimen  other than nasopharyngeal swab, presence of viral mutation(s) within the areas targeted by this assay, and inadequate number of viral copies (<250 copies / mL). A negative result must be combined with clinical observations, patient history, and epidemiological information.  Fact Sheet for Patients:   StrictlyIdeas.no  Fact Sheet for Healthcare Providers: BankingDealers.co.za  This test is not yet approved or  cleared by the [place] FDA and has been authorized for detection and/or diagnosis of SARS-CoV-2 by FDA under an Emergency Use Authorization (EUA).  This EUA will remain in effect (meaning this test can be used) for the duration of the COVID-19 declaration under Section 564(b)(1) of the Act, 21 U.S.C. section 360bbb-3(b)(1), unless the authorization is terminated or revoked sooner.  Performed at [facility], [place] [address] [place], [place] [address]      Labs:     Basic Metabolic Panel: Recent Labs  Lab 07/14/20 0619 07/15/20 0457 07/16/20 0518 07/18/20 0446 07/19/20 0426  NA 137 137 137 134* 139  K 4.1 3.6 4.2 3.6 3.8  CL 100 102 103 102 106  CO2 27 27 26 24 24   GLUCOSE 104*  115* 104* 126* 120*  BUN 16 17 18  26* 27*  CREATININE 0.91 0.87 0.89 1.29* 1.17*  CALCIUM 8.6* 8.8* 9.3 8.4* 8.5*  MG  --   --  1.9  --   --    Liver Function Tests: Recent Labs  Lab 07/13/20 0642 07/14/20 0619 07/15/20 0457 07/16/20 0518  AST 15 19 16 22   ALT 12 14 13 11   ALKPHOS 63 68 70 77  BILITOT 0.9 1.2 0.6 0.7  PROT 6.9 7.0 6.8 7.4  ALBUMIN 3.5 3.4* 3.2* 3.2*   CBC: Recent Labs  Lab 07/13/20 0642 07/18/20 0446 07/19/20 0426  WBC 9.7 19.2* 15.1*  HGB 12.1 11.6* 11.0*  HCT 39.7 38.0 35.8*  MCV 98.5 98.2 97.5  PLT 172 166 184    CBG: Recent Labs  Lab 07/18/20 1246 07/18/20 1616 07/18/20 2038 07/19/20 0740 07/19/20 1151  GLUCAP 136* 135* 116* 119* 161*     IMAGING STUDIES DG  Chest 1 View  Result Date: 07/09/2020 CLINICAL DATA:  Fall. Additional history provided: Former smoker, COPD, hypertension, history of tubular adenoma of colon 2006, history of asthma. EXAM: CHEST  1 VIEW COMPARISON:  Prior chest radiographs 04/27/2020. FINDINGS: Shallow inspiration radiograph. Cardiomegaly. Aortic atherosclerosis. Chronic prominence of the interstitial lung markings. No appreciable airspace consolidation. No evidence of pleural effusion or pneumothorax. No acute bony abnormality identified. Partially imaged metallic foreign body projecting over the upper lumbar spine, appearing most suggestive of an IVC filter. IMPRESSION: Shallow inspiration radiograph. No evidence of acute cardiopulmonary abnormality. Chronic prominence of the interstitial lung markings. Unchanged cardiomegaly. Aortic Atherosclerosis (ICD10-I70.0). Electronically Signed   By: Kellie Simmering DO   On: 07/09/2020 17:43   CT HEAD WO CONTRAST  Result Date: 07/16/2020 CLINICAL DATA:  84 year old female with altered mental status. Neurologic deficit. EXAM: CT HEAD WITHOUT CONTRAST TECHNIQUE: Contiguous axial images were obtained from the base of the skull through the vertex without intravenous contrast. COMPARISON:  Head CT 07/11/2020.  Brain MRI 12/16/2019. FINDINGS: Brain: Mild motion artifact today. No midline shift, mass effect, or evidence of intracranial mass lesion. Stable ventricle size and configuration. No ventriculomegaly. No acute intracranial hemorrhage identified. Advanced bilateral cerebral white matter hypodensity and including involvement of the deep white matter capsules. This appears similar to the prior MRI. Associated chronic heterogeneity of the bilateral deep gray nuclei. Stable gray-white matter differentiation throughout the brain. No cortically based acute infarct identified. Vascular: Calcified atherosclerosis at the skull base. No suspicious intracranial vascular hyperdensity. Skull: Mild motion artifact.  No acute osseous abnormality identified. Sinuses/Orbits: Stable mild left mastoid effusion. Possible small volume retained secretions in the nasopharynx. Left tympanic cavity, other paranasal sinuses and mastoids remain clear. Other: Stable orbit and scalp soft tissues. IMPRESSION: 1. Mild motion artifact today. No acute or evolving infarct identified. Underlying severe chronic small vessel disease. 2. Stable mild left mastoid effusion, likely postinflammatory. Electronically Signed   By: Genevie Ann M.D.   On: 07/16/2020 16:57   CT Head Wo Contrast  Result Date: 07/11/2020 CLINICAL DATA:  Unwitnessed fall. EXAM: CT HEAD WITHOUT CONTRAST TECHNIQUE: Contiguous axial images were obtained from the base of the skull through the vertex without intravenous contrast. COMPARISON:  July 09, 2020. FINDINGS: Brain: Mild chronic ischemic white matter disease is noted. No mass effect or midline shift is noted. Ventricular size is within normal limits. There is no evidence of mass lesion, hemorrhage or acute infarction. Vascular: No hyperdense vessel or unexpected calcification. Skull: Normal. Negative for fracture or focal lesion. Sinuses/Orbits:  No acute finding. Other: None. IMPRESSION: Mild chronic ischemic white matter disease. No acute intracranial abnormality seen. Electronically Signed   By: Marijo Conception M.D.   On: 07/11/2020 13:47   CT Head Wo Contrast  Result Date: 07/09/2020 CLINICAL DATA:  [place] EXAM: CT HEAD WITHOUT CONTRAST TECHNIQUE: Contiguous axial images were obtained from the base of the skull through the vertex without intravenous contrast. COMPARISON:  04/27/2020 FINDINGS: Brain: Chronic hypodensities within the periventricular white matter and bilateral basal ganglia are stable, consistent with chronic small vessel ischemic changes. No acute infarct or hemorrhage. Lateral ventricles and remaining midline structures are unremarkable. No acute extra-axial fluid collections. No mass effect. Vascular:  No hyperdense vessel or unexpected calcification. Skull: Normal. Negative for fracture or focal lesion. Minimal residual left frontal scalp hematoma, markedly decreased since previous exam. Sinuses/Orbits: No acute finding. Other: None. IMPRESSION: 1. No acute intracranial process. 2. Stable chronic small-vessel ischemic changes throughout the white matter and bilateral basal ganglia. Electronically Signed   By: Randa Ngo M.D.   On: 07/09/2020 17:37   CT Hip Right Wo Contrast  Result Date: 07/11/2020 CLINICAL DATA:  Right hip pain after fall. EXAM: CT OF THE RIGHT HIP WITHOUT CONTRAST TECHNIQUE: Multidetector CT imaging of the right hip was performed according to the standard protocol. Multiplanar CT image reconstructions were also generated. COMPARISON:  CT right hip dated July 09, 2020. FINDINGS: Bones/Joint/Cartilage No fracture or dislocation. Unchanged mild right hip osteoarthritis. No joint effusion. Ligaments Ligaments are suboptimally evaluated by CT. Muscles and Tendons Grossly intact. Soft tissue No fluid collection or hematoma. No soft tissue mass. Sigmoid colonic diverticulosis. IMPRESSION: 1. No acute osseous abnormality. Electronically Signed   By: Titus Dubin M.D.   On: 07/11/2020 13:40   CT Hip Right Wo Contrast  Result Date: 07/09/2020 CLINICAL DATA:  Fall in the way to the bathroom. Right hip/leg pain. EXAM: CT OF THE RIGHT HIP WITHOUT CONTRAST TECHNIQUE: Multidetector CT imaging of the right hip was performed according to the standard protocol. Multiplanar CT image reconstructions were also generated. COMPARISON:  Radiograph earlier today. FINDINGS: Bones/Joint/Cartilage No acute fracture. No dislocation. Right hip osteoarthritis with joint space narrowing and acetabular spurring. No avascular necrosis. No focal bone lesion. Degenerative change of the pubic symphysis with osteophytes. No significant hip joint effusion. Ligaments Suboptimally assessed by CT. Muscles and Tendons  No intramuscular hematoma.  Muscle bulk is maintained. Soft tissues Mild skin thickening and soft tissue edema laterally. No confluent soft tissue hematoma. Diverticulosis in the sigmoid colon partially included. IMPRESSION: 1. No acute fracture or dislocation of the right hip. 2. Right hip osteoarthritis. Electronically Signed   By: Keith Rake M.D.   On: 07/09/2020 18:55   DG Hip Unilat With Pelvis 2-3 Views Right  Result Date: 07/11/2020 CLINICAL DATA:  Right hip pain after a fall. EXAM: DG HIP (WITH OR WITHOUT PELVIS) 2-3V RIGHT COMPARISON:  Hip radiographs dated 07/09/2020. FINDINGS: There is no evidence of hip fracture or dislocation. Mild degenerative changes are seen in both hips. IMPRESSION: No acute osseous injury. Electronically Signed   By: Zerita Boers M.D.   On: 07/11/2020 11:56   DG Hip Unilat W or Wo Pelvis 2-3 Views Right  Result Date: 07/09/2020 CLINICAL DATA:  Fall on the way to the bathroom.  Right hip pain. EXAM: DG HIP (WITH OR WITHOUT PELVIS) 2-3V RIGHT COMPARISON:  None. FINDINGS: The cortical margins of the bony pelvis and right hip are intact. No fracture. Pubic symphysis and sacroiliac  joints are congruent. Both femoral heads are well-seated in the respective acetabula. Minimal degenerative spurring of the acetabulum. There may be soft tissue edema laterally versus habitus. IMPRESSION: No fracture of the pelvis or right hip. Electronically Signed   By: Keith Rake M.D.   On: 07/09/2020 17:40   DG Femur Min 2 Views Right  Result Date: 07/09/2020 CLINICAL DATA:  Recent fall with right leg pain, initial encounter EXAM: RIGHT FEMUR 2 VIEWS COMPARISON:  None. FINDINGS: Degenerative changes about the knee joint are noted most prominent medially. No acute fracture or dislocation is noted. No joint effusion is seen. No gross soft tissue abnormality is noted. IMPRESSION: Degenerative change without acute abnormality. Electronically Signed   By: Inez Catalina M.D.   On:  07/09/2020 18:29    DISCHARGE EXAMINATION: Vitals:   07/18/20 1325 07/18/20 2002 07/19/20 0442 07/19/20 0815  BP: 128/61 (!) 141/96 138/73 105/90  Pulse: 79 82 89 (!) 103  Resp: 18 18 16    Temp: (!) 97.5 F (36.4 C) 97.8 F (36.6 C) 98 F (36.7 C)   TempSrc: Oral Oral Oral   SpO2: 95% 93% 99%   Weight:      Height:       General appearance: Awake alert.  In no distress.  Distracted Resp: Clear to auscultation bilaterally.  Normal effort Cardio: S1-S2 is normal regular.  No S3-S4.  No rubs murmurs or bruit GI: Abdomen is soft.  Nontender nondistended.  Bowel sounds are present normal.  No masses organomegaly    DISPOSITION: SNF  Discharge Instructions    Call MD for:  difficulty breathing, headache or visual disturbances   Complete by: As directed    Call MD for:  extreme fatigue   Complete by: As directed    Call MD for:  persistant dizziness or light-headedness   Complete by: As directed    Call MD for:  persistant nausea and vomiting   Complete by: As directed    Call MD for:  severe uncontrolled pain   Complete by: As directed    Call MD for:  temperature >100.4   Complete by: As directed    Discharge instructions   Complete by: As directed    Please review instructions on the discharge summary.  You were cared for by a hospitalist during your hospital stay. If you have any questions about your discharge medications or the care you received while you were in the hospital after you are discharged, you can call the unit and asked to speak with the hospitalist on call if the hospitalist that took care of you is not available. Once you are discharged, your primary care physician will handle any further medical issues. Please note that NO REFILLS for any discharge medications will be authorized once you are discharged, as it is imperative that you return to your primary care physician (or establish a relationship with a primary care physician if you do not have one) for your  aftercare needs so that they can reassess your need for medications and monitor your lab values. If you do not have a primary care physician, you can call [phone] for a physician referral.   Increase activity slowly   Complete by: As directed         Allergies as of 07/19/2020      Reactions   Penicillins Anaphylaxis   Codeine Other (See Comments)   Listed on MAR   Penicillins Other (See Comments)   Listed on MAR   Sulfonylureas  Other (See Comments)   Unknown reaction   Sulfonylureas Other (See Comments)   Listed on MAR   Codeine Other (See Comments)   Unknown reaction   Sulfonamide Derivatives Other (See Comments)   Unknown reaction      Medication List    STOP taking these medications   linagliptin 5 MG Tabs tablet Commonly known as: Tradjenta   repaglinide 0.5 MG tablet Commonly known as: Prandin     TAKE these medications   acetaminophen 500 MG tablet Commonly known as: TYLENOL Take 500 mg by mouth every 4 (four) hours as needed (pain).   albuterol 108 (90 Base) MCG/ACT inhaler Commonly known as: VENTOLIN HFA Inhale 2 puffs into the lungs every 6 (six) hours as needed for wheezing or shortness of breath.   atorvastatin 10 MG tablet Commonly known as: LIPITOR Take 1 tablet (10 mg total) by mouth at bedtime.   Basaglar KwikPen 100 UNIT/ML Inject 5 Units into the skin at bedtime. What changed:   how much to take  Another medication with the same name was removed. Continue taking this medication, and follow the directions you see here.   buPROPion 300 MG 24 hr tablet Commonly known as: WELLBUTRIN XL Take 1 tablet (300 mg total) by mouth daily. Start taking on: July 20, 2020 What changed:   medication strength  how much to take   carvedilol 12.5 MG tablet Commonly known as: COREG Take 1 tablet (12.5 mg total) by mouth 2 (two) times daily with a meal.   docusate sodium 100 MG capsule Commonly known as: COLACE Take 100 mg by mouth daily.    ezetimibe 10 MG tablet Commonly known as: ZETIA Take 10 mg by mouth daily.   gabapentin 300 MG capsule Commonly known as: NEURONTIN Take 1 cap in AM, 1 cap at noon, 2 caps at bedtime What changed:   how much to take  how to take this  when to take this  additional instructions   ketoconazole 2 % shampoo Commonly known as: NIZORAL Apply 1 application topically See admin instructions. Use topically to shampoo hair twice weekly - Tuesday and Friday   levothyroxine 88 MCG tablet Commonly known as: SYNTHROID Take 88 mcg by mouth daily before breakfast.   LORazepam 0.5 MG tablet Commonly known as: ATIVAN Take 1 tablet (0.5 mg total) by mouth every 8 (eight) hours as needed for anxiety.   meclizine 25 MG tablet Commonly known as: ANTIVERT Take 25 mg by mouth at bedtime as needed for dizziness.   multivitamin with minerals Tabs tablet Take 1 tablet by mouth daily. Start taking on: July 20, 2020   NovoLOG FlexPen 100 UNIT/ML FlexPen Generic drug: insulin aspart Inject 0-12 Units into the skin See admin instructions. Inject 0-12 units subcutaneously four times daily - before meals and at bedtime - per sliding scale: CBG 0-150 0 units, 151-200 4 units, 201-250 6 units, 251-300 8 units, 301-350 10 units, 351-400 12 units, >400 12 units   nystatin powder Generic drug: nystatin Apply 1 application topically 2 (two) times daily.   ondansetron 4 MG tablet Commonly known as: ZOFRAN Take 4 mg by mouth every 8 (eight) hours as needed for nausea or vomiting.   OXYGEN Inhale 3 L into the lungs continuous. What changed: Another medication with the same name was removed. Continue taking this medication, and follow the directions you see here.   polyethylene glycol 17 g packet Commonly known as: MIRALAX / GLYCOLAX Take 17 g by mouth daily as  needed for mild constipation or moderate constipation.   rivaroxaban 20 MG Tabs tablet Commonly known as: Xarelto Take 1 tablet (20 mg  total) by mouth daily with supper. What changed: when to take this   sertraline 100 MG tablet Commonly known as: ZOLOFT Take 1 tablet (100 mg total) by mouth daily. Start taking on: July 20, 2020 What changed:   medication strength  how much to take   Systane Balance 0.6 % Soln Generic drug: Propylene Glycol Place 1 drop into both eyes 4 (four) times daily as needed (for dry eyes).         Contact information for after-discharge care    [facility] .   Service: Skilled Nursing Contact information: [place]                  TOTAL DISCHARGE TIME: 28 minutes  [place]  [facility] Pager on [email or web address]  07/19/2020, 1:45 PM

## 2020-07-19 NOTE — TOC Progression Note (Signed)
Transition of Care [facility]) - Progression Note    Patient Details  Name: Dawn Morrow MRN: [id number] Date of Birth: Mar 02, 1936  Transition [facility]) CM/SW Contact  Purcell Mouton, RN Phone Number: 07/19/2020, 12:34 PM  Clinical Narrative:     Dawn Morrow was called. RN is aware.   Expected Discharge Plan: [place] Barriers to Discharge: Continued Medical Work up  Expected Discharge Plan and Services Expected Discharge Plan: [place] In-house Referral: Clinical Social Work Discharge Planning Services: CM Consult   Living arrangements for the past 2 months: [place] Expected Discharge Date: 07/19/20                                     Social Determinants of Health (SDOH) Interventions    Readmission Risk Interventions Readmission Risk Prevention Plan 10/03/2019  Transportation Screening Complete  PCP or Specialist Appt within 3-5 Days Complete  HRI or [place] Not Complete  HRI or Home Care Consult comments Patient at her baseline  Social Work Consult for [place] Planning/Counseling [place] Not Applicable  Medication Review [employer]) Referral to Pharmacy  Some recent data might be hidden

## 2020-07-19 NOTE — Progress Notes (Signed)
AVS placed in patient discharge packet. All patient belongings in belongings bag, sent with transport company to send to facility.  IV access removed. Dressing intact.

## 2020-07-19 NOTE — Progress Notes (Signed)
Attempted to call report, Sheryl at [employer] states she will have the nurse receiving patient call this RN back. Number provided.

## 2020-07-19 NOTE — Progress Notes (Signed)
Attempted to call report to [facility]. No answer. Will try again later.

## 2020-07-19 NOTE — Progress Notes (Signed)
Report given to [facility] LPN at [facility].

## 2020-07-19 NOTE — Progress Notes (Signed)
Attempted again to call report. Nurse not available. Sheryl state nurse will return phone call for report.

## 2020-07-30 ENCOUNTER — Inpatient Hospital Stay (HOSPITAL_COMMUNITY)
Admission: EM | Admit: 2020-07-30 | Discharge: 2020-08-03 | DRG: 689 | Disposition: A | Discharge status: Skilled Nursing Facility | Payer: Medicare Other | Attending: Internal Medicine | Admitting: Internal Medicine

## 2020-07-30 ENCOUNTER — Emergency Department (HOSPITAL_COMMUNITY): Payer: Medicare Other

## 2020-07-30 DIAGNOSIS — Z794 Long term (current) use of insulin: Secondary | ICD-10-CM

## 2020-07-30 DIAGNOSIS — R4182 Altered mental status, unspecified: Secondary | ICD-10-CM

## 2020-07-30 DIAGNOSIS — J189 Pneumonia, unspecified organism: Secondary | ICD-10-CM

## 2020-07-30 DIAGNOSIS — I5033 Acute on chronic diastolic (congestive) heart failure: Secondary | ICD-10-CM | POA: Diagnosis present

## 2020-07-30 DIAGNOSIS — Z79899 Other long term (current) drug therapy: Secondary | ICD-10-CM

## 2020-07-30 DIAGNOSIS — Z1623 Resistance to quinolones and fluoroquinolones: Secondary | ICD-10-CM | POA: Diagnosis present

## 2020-07-30 DIAGNOSIS — Z6831 Body mass index (BMI) 31.0-31.9, adult: Secondary | ICD-10-CM

## 2020-07-30 DIAGNOSIS — Z87891 Personal history of nicotine dependence: Secondary | ICD-10-CM

## 2020-07-30 DIAGNOSIS — F419 Anxiety disorder, unspecified: Secondary | ICD-10-CM | POA: Diagnosis present

## 2020-07-30 DIAGNOSIS — I4819 Other persistent atrial fibrillation: Secondary | ICD-10-CM | POA: Diagnosis present

## 2020-07-30 DIAGNOSIS — F0390 Unspecified dementia without behavioral disturbance: Secondary | ICD-10-CM | POA: Diagnosis present

## 2020-07-30 DIAGNOSIS — Z20822 Contact with and (suspected) exposure to covid-19: Secondary | ICD-10-CM | POA: Diagnosis present

## 2020-07-30 DIAGNOSIS — J449 Chronic obstructive pulmonary disease, unspecified: Secondary | ICD-10-CM | POA: Diagnosis present

## 2020-07-30 DIAGNOSIS — L8990 Pressure ulcer of unspecified site, unspecified stage: Secondary | ICD-10-CM | POA: Insufficient documentation

## 2020-07-30 DIAGNOSIS — E785 Hyperlipidemia, unspecified: Secondary | ICD-10-CM | POA: Diagnosis present

## 2020-07-30 DIAGNOSIS — I130 Hypertensive heart and chronic kidney disease with heart failure and stage 1 through stage 4 chronic kidney disease, or unspecified chronic kidney disease: Secondary | ICD-10-CM | POA: Diagnosis present

## 2020-07-30 DIAGNOSIS — J9612 Chronic respiratory failure with hypercapnia: Secondary | ICD-10-CM | POA: Diagnosis present

## 2020-07-30 DIAGNOSIS — Z8673 Personal history of transient ischemic attack (TIA), and cerebral infarction without residual deficits: Secondary | ICD-10-CM

## 2020-07-30 DIAGNOSIS — Z882 Allergy status to sulfonamides status: Secondary | ICD-10-CM

## 2020-07-30 DIAGNOSIS — Z7401 Bed confinement status: Secondary | ICD-10-CM

## 2020-07-30 DIAGNOSIS — L89312 Pressure ulcer of right buttock, stage 2: Secondary | ICD-10-CM | POA: Diagnosis present

## 2020-07-30 DIAGNOSIS — Z888 Allergy status to other drugs, medicaments and biological substances status: Secondary | ICD-10-CM

## 2020-07-30 DIAGNOSIS — Z9981 Dependence on supplemental oxygen: Secondary | ICD-10-CM

## 2020-07-30 DIAGNOSIS — Z86711 Personal history of pulmonary embolism: Secondary | ICD-10-CM

## 2020-07-30 DIAGNOSIS — G9341 Metabolic encephalopathy: Secondary | ICD-10-CM | POA: Diagnosis present

## 2020-07-30 DIAGNOSIS — Z86718 Personal history of other venous thrombosis and embolism: Secondary | ICD-10-CM

## 2020-07-30 DIAGNOSIS — F32A Depression, unspecified: Secondary | ICD-10-CM | POA: Diagnosis present

## 2020-07-30 DIAGNOSIS — A419 Sepsis, unspecified organism: Principal | ICD-10-CM

## 2020-07-30 DIAGNOSIS — R6520 Severe sepsis without septic shock: Secondary | ICD-10-CM

## 2020-07-30 DIAGNOSIS — E1122 Type 2 diabetes mellitus with diabetic chronic kidney disease: Secondary | ICD-10-CM | POA: Diagnosis present

## 2020-07-30 DIAGNOSIS — Z880 Allergy status to penicillin: Secondary | ICD-10-CM

## 2020-07-30 DIAGNOSIS — J9611 Chronic respiratory failure with hypoxia: Secondary | ICD-10-CM | POA: Diagnosis present

## 2020-07-30 DIAGNOSIS — N3001 Acute cystitis with hematuria: Principal | ICD-10-CM | POA: Diagnosis present

## 2020-07-30 DIAGNOSIS — Z79890 Hormone replacement therapy (postmenopausal): Secondary | ICD-10-CM

## 2020-07-30 DIAGNOSIS — N179 Acute kidney failure, unspecified: Secondary | ICD-10-CM

## 2020-07-30 DIAGNOSIS — Z833 Family history of diabetes mellitus: Secondary | ICD-10-CM

## 2020-07-30 DIAGNOSIS — N390 Urinary tract infection, site not specified: Secondary | ICD-10-CM | POA: Diagnosis present

## 2020-07-30 DIAGNOSIS — E039 Hypothyroidism, unspecified: Secondary | ICD-10-CM | POA: Diagnosis present

## 2020-07-30 DIAGNOSIS — E875 Hyperkalemia: Secondary | ICD-10-CM | POA: Diagnosis present

## 2020-07-30 DIAGNOSIS — R296 Repeated falls: Secondary | ICD-10-CM | POA: Diagnosis present

## 2020-07-30 DIAGNOSIS — N1832 Chronic kidney disease, stage 3b: Secondary | ICD-10-CM | POA: Diagnosis present

## 2020-07-30 DIAGNOSIS — E860 Dehydration: Secondary | ICD-10-CM | POA: Diagnosis present

## 2020-07-30 DIAGNOSIS — B964 Proteus (mirabilis) (morganii) as the cause of diseases classified elsewhere: Secondary | ICD-10-CM | POA: Diagnosis present

## 2020-07-30 DIAGNOSIS — M8580 Other specified disorders of bone density and structure, unspecified site: Secondary | ICD-10-CM | POA: Diagnosis present

## 2020-07-30 DIAGNOSIS — Z981 Arthrodesis status: Secondary | ICD-10-CM

## 2020-07-30 DIAGNOSIS — J690 Pneumonitis due to inhalation of food and vomit: Secondary | ICD-10-CM | POA: Diagnosis present

## 2020-07-30 DIAGNOSIS — Z7901 Long term (current) use of anticoagulants: Secondary | ICD-10-CM

## 2020-07-30 DIAGNOSIS — K219 Gastro-esophageal reflux disease without esophagitis: Secondary | ICD-10-CM | POA: Diagnosis present

## 2020-07-30 DIAGNOSIS — Z9049 Acquired absence of other specified parts of digestive tract: Secondary | ICD-10-CM

## 2020-07-30 DIAGNOSIS — Z66 Do not resuscitate: Secondary | ICD-10-CM | POA: Diagnosis present

## 2020-07-30 DIAGNOSIS — G929 Unspecified toxic encephalopathy: Secondary | ICD-10-CM | POA: Diagnosis present

## 2020-07-30 DIAGNOSIS — E1169 Type 2 diabetes mellitus with other specified complication: Secondary | ICD-10-CM

## 2020-07-30 DIAGNOSIS — Z885 Allergy status to narcotic agent status: Secondary | ICD-10-CM

## 2020-07-30 DIAGNOSIS — E669 Obesity, unspecified: Secondary | ICD-10-CM | POA: Diagnosis present

## 2020-07-30 DIAGNOSIS — Z809 Family history of malignant neoplasm, unspecified: Secondary | ICD-10-CM

## 2020-07-30 MED ORDER — SODIUM CHLORIDE 0.9 % IV SOLN: INTRAVENOUS | Status: DC

## 2020-07-30 MED ORDER — SODIUM CHLORIDE 0.9 % IV BOLUS
1000.0000 mL | Freq: Once | INTRAVENOUS | Status: AC
  Administered 2020-07-30: 1000 mL via INTRAVENOUS

## 2020-07-30 NOTE — ED Triage Notes (Signed)
Pt bib gems from [facility] reports pt is has became less responsive throughout the day and only oriented to self and was concerned for possible stroke. Pt A&Ox4 at baseline. Hx of a-fib. EMS reports no slurred speech or facial drop.[place] unknown.

## 2020-07-31 ENCOUNTER — Emergency Department (HOSPITAL_COMMUNITY): Payer: Medicare Other

## 2020-07-31 ENCOUNTER — Inpatient Hospital Stay (HOSPITAL_COMMUNITY): Payer: Medicare Other

## 2020-07-31 DIAGNOSIS — Z1623 Resistance to quinolones and fluoroquinolones: Secondary | ICD-10-CM | POA: Diagnosis present

## 2020-07-31 DIAGNOSIS — Z20822 Contact with and (suspected) exposure to covid-19: Secondary | ICD-10-CM | POA: Diagnosis present

## 2020-07-31 DIAGNOSIS — F0390 Unspecified dementia without behavioral disturbance: Secondary | ICD-10-CM | POA: Diagnosis present

## 2020-07-31 DIAGNOSIS — R4182 Altered mental status, unspecified: Secondary | ICD-10-CM | POA: Diagnosis present

## 2020-07-31 DIAGNOSIS — R296 Repeated falls: Secondary | ICD-10-CM | POA: Diagnosis present

## 2020-07-31 DIAGNOSIS — N390 Urinary tract infection, site not specified: Secondary | ICD-10-CM | POA: Diagnosis not present

## 2020-07-31 DIAGNOSIS — J189 Pneumonia, unspecified organism: Secondary | ICD-10-CM

## 2020-07-31 DIAGNOSIS — N3001 Acute cystitis with hematuria: Secondary | ICD-10-CM | POA: Diagnosis present

## 2020-07-31 DIAGNOSIS — I5031 Acute diastolic (congestive) heart failure: Secondary | ICD-10-CM | POA: Diagnosis not present

## 2020-07-31 DIAGNOSIS — N179 Acute kidney failure, unspecified: Secondary | ICD-10-CM | POA: Diagnosis present

## 2020-07-31 DIAGNOSIS — F419 Anxiety disorder, unspecified: Secondary | ICD-10-CM | POA: Diagnosis present

## 2020-07-31 DIAGNOSIS — I130 Hypertensive heart and chronic kidney disease with heart failure and stage 1 through stage 4 chronic kidney disease, or unspecified chronic kidney disease: Secondary | ICD-10-CM | POA: Diagnosis present

## 2020-07-31 DIAGNOSIS — J9611 Chronic respiratory failure with hypoxia: Secondary | ICD-10-CM | POA: Diagnosis present

## 2020-07-31 DIAGNOSIS — Z66 Do not resuscitate: Secondary | ICD-10-CM | POA: Diagnosis present

## 2020-07-31 DIAGNOSIS — A419 Sepsis, unspecified organism: Secondary | ICD-10-CM | POA: Diagnosis present

## 2020-07-31 DIAGNOSIS — Z86711 Personal history of pulmonary embolism: Secondary | ICD-10-CM | POA: Diagnosis not present

## 2020-07-31 DIAGNOSIS — L8990 Pressure ulcer of unspecified site, unspecified stage: Secondary | ICD-10-CM | POA: Insufficient documentation

## 2020-07-31 DIAGNOSIS — I5033 Acute on chronic diastolic (congestive) heart failure: Secondary | ICD-10-CM | POA: Diagnosis present

## 2020-07-31 DIAGNOSIS — J449 Chronic obstructive pulmonary disease, unspecified: Secondary | ICD-10-CM | POA: Diagnosis present

## 2020-07-31 DIAGNOSIS — Z86718 Personal history of other venous thrombosis and embolism: Secondary | ICD-10-CM | POA: Diagnosis not present

## 2020-07-31 DIAGNOSIS — N1832 Chronic kidney disease, stage 3b: Secondary | ICD-10-CM | POA: Diagnosis present

## 2020-07-31 DIAGNOSIS — F32A Depression, unspecified: Secondary | ICD-10-CM | POA: Diagnosis present

## 2020-07-31 DIAGNOSIS — Z8673 Personal history of transient ischemic attack (TIA), and cerebral infarction without residual deficits: Secondary | ICD-10-CM | POA: Diagnosis not present

## 2020-07-31 DIAGNOSIS — G9341 Metabolic encephalopathy: Secondary | ICD-10-CM

## 2020-07-31 DIAGNOSIS — J9612 Chronic respiratory failure with hypercapnia: Secondary | ICD-10-CM | POA: Diagnosis present

## 2020-07-31 DIAGNOSIS — Z6831 Body mass index (BMI) 31.0-31.9, adult: Secondary | ICD-10-CM | POA: Diagnosis not present

## 2020-07-31 DIAGNOSIS — G929 Unspecified toxic encephalopathy: Secondary | ICD-10-CM | POA: Diagnosis present

## 2020-07-31 DIAGNOSIS — I4819 Other persistent atrial fibrillation: Secondary | ICD-10-CM | POA: Diagnosis present

## 2020-07-31 DIAGNOSIS — J690 Pneumonitis due to inhalation of food and vomit: Secondary | ICD-10-CM | POA: Diagnosis present

## 2020-07-31 LAB — COMPREHENSIVE METABOLIC PANEL
ALT: 35 U/L (ref 0–44)
AST: 32 U/L (ref 15–41)
Albumin: 2.9 g/dL — ABNORMAL LOW (ref 3.5–5.0)
Alkaline Phosphatase: 88 U/L (ref 38–126)
Anion gap: 11 (ref 5–15)
BUN: 34 mg/dL — ABNORMAL HIGH (ref 8–23)
CO2: 29 mmol/L (ref 22–32)
Calcium: 9.3 mg/dL (ref 8.9–10.3)
Chloride: 99 mmol/L (ref 98–111)
Creatinine, Ser: 1.83 mg/dL — ABNORMAL HIGH (ref 0.44–1.00)
GFR, Estimated: 27 mL/min — ABNORMAL LOW (ref >60)
Glucose, Bld: 151 mg/dL — ABNORMAL HIGH (ref 70–99)
Potassium: 5.2 mmol/L — ABNORMAL HIGH (ref 3.5–5.1)
Sodium: 139 mmol/L (ref 135–145)
Total Bilirubin: 0.9 mg/dL (ref 0.3–1.2)
Total Protein: 6.8 g/dL (ref 6.5–8.1)

## 2020-07-31 LAB — I-STAT CHEM 8, ED
BUN: 37 mg/dL — ABNORMAL HIGH (ref 8–23)
Calcium, Ion: 1.1 mmol/L — ABNORMAL LOW (ref 1.15–1.40)
Chloride: 100 mmol/L (ref 98–111)
Creatinine, Ser: 1.8 mg/dL — ABNORMAL HIGH (ref 0.44–1.00)
Glucose, Bld: 142 mg/dL — ABNORMAL HIGH (ref 70–99)
HCT: 43 % (ref 36.0–46.0)
Hemoglobin: 14.6 g/dL (ref 12.0–15.0)
Potassium: 5.1 mmol/L (ref 3.5–5.1)
Sodium: 139 mmol/L (ref 135–145)
TCO2: 29 mmol/L (ref 22–32)

## 2020-07-31 LAB — ECHOCARDIOGRAM COMPLETE
Area-P 1/2: 4.31 cm2
Calc EF: 59.6 %
Height: 65.5 in
S' Lateral: 3.2 cm
Single Plane A2C EF: 62.4 %
Single Plane A4C EF: 58.2 %
Weight: 3008.84 oz

## 2020-07-31 LAB — CBC WITH DIFFERENTIAL/PLATELET
Abs Immature Granulocytes: 0.17 10*3/uL — ABNORMAL HIGH (ref 0.00–0.07)
Basophils Absolute: 0.1 10*3/uL (ref 0.0–0.1)
Basophils Relative: 0 %
Eosinophils Absolute: 0 10*3/uL (ref 0.0–0.5)
Eosinophils Relative: 0 %
HCT: 42.9 % (ref 36.0–46.0)
Hemoglobin: 13 g/dL (ref 12.0–15.0)
Immature Granulocytes: 1 %
Lymphocytes Relative: 9 %
Lymphs Abs: 2.2 10*3/uL (ref 0.7–4.0)
MCH: 28.9 pg (ref 26.0–34.0)
MCHC: 30.3 g/dL (ref 30.0–36.0)
MCV: 95.3 fL (ref 80.0–100.0)
Monocytes Absolute: 0.7 10*3/uL (ref 0.1–1.0)
Monocytes Relative: 3 %
Neutro Abs: 21.1 10*3/uL — ABNORMAL HIGH (ref 1.7–7.7)
Neutrophils Relative %: 87 %
Platelets: 366 10*3/uL (ref 150–400)
RBC: 4.5 MIL/uL (ref 3.87–5.11)
RDW: 16.6 % — ABNORMAL HIGH (ref 11.5–15.5)
WBC: 24.2 10*3/uL — ABNORMAL HIGH (ref 4.0–10.5)
nRBC: 0 % (ref 0.0–0.2)

## 2020-07-31 LAB — SODIUM, URINE, RANDOM: Sodium, Ur: 16 mmol/L

## 2020-07-31 LAB — BASIC METABOLIC PANEL
Anion gap: 10 (ref 5–15)
BUN: 33 mg/dL — ABNORMAL HIGH (ref 8–23)
CO2: 30 mmol/L (ref 22–32)
Calcium: 8.8 mg/dL — ABNORMAL LOW (ref 8.9–10.3)
Chloride: 99 mmol/L (ref 98–111)
Creatinine, Ser: 1.75 mg/dL — ABNORMAL HIGH (ref 0.44–1.00)
GFR, Estimated: 29 mL/min — ABNORMAL LOW (ref >60)
Glucose, Bld: 148 mg/dL — ABNORMAL HIGH (ref 70–99)
Potassium: 5.3 mmol/L — ABNORMAL HIGH (ref 3.5–5.1)
Sodium: 139 mmol/L (ref 135–145)

## 2020-07-31 LAB — URINALYSIS, COMPLETE (UACMP) WITH MICROSCOPIC
Glucose, UA: NEGATIVE mg/dL
Ketones, ur: NEGATIVE mg/dL
Nitrite: NEGATIVE
Protein, ur: 30 mg/dL — AB
Specific Gravity, Urine: 1.021 (ref 1.005–1.030)
WBC, UA: >50 WBC/hpf — ABNORMAL HIGH (ref 0–5)
pH: 6 (ref 5.0–8.0)

## 2020-07-31 LAB — CBC
HCT: 38.4 % (ref 36.0–46.0)
Hemoglobin: 11.4 g/dL — ABNORMAL LOW (ref 12.0–15.0)
MCH: 28.3 pg (ref 26.0–34.0)
MCHC: 29.7 g/dL — ABNORMAL LOW (ref 30.0–36.0)
MCV: 95.3 fL (ref 80.0–100.0)
Platelets: 366 10*3/uL (ref 150–400)
RBC: 4.03 MIL/uL (ref 3.87–5.11)
RDW: 16.7 % — ABNORMAL HIGH (ref 11.5–15.5)
WBC: 23.8 10*3/uL — ABNORMAL HIGH (ref 4.0–10.5)
nRBC: 0 % (ref 0.0–0.2)

## 2020-07-31 LAB — GLUCOSE, CAPILLARY
Glucose-Capillary: 142 mg/dL — ABNORMAL HIGH (ref 70–99)
Glucose-Capillary: 114 mg/dL — ABNORMAL HIGH (ref 70–99)
Glucose-Capillary: 130 mg/dL — ABNORMAL HIGH (ref 70–99)
Glucose-Capillary: 118 mg/dL — ABNORMAL HIGH (ref 70–99)

## 2020-07-31 LAB — PROCALCITONIN: Procalcitonin: 0.11 ng/mL

## 2020-07-31 LAB — RESPIRATORY PANEL BY RT PCR (FLU A&B, COVID)
Influenza A by PCR: NEGATIVE
Influenza B by PCR: NEGATIVE
SARS Coronavirus 2 by RT PCR: NEGATIVE

## 2020-07-31 LAB — CBG MONITORING, ED: Glucose-Capillary: 121 mg/dL — ABNORMAL HIGH (ref 70–99)

## 2020-07-31 LAB — I-STAT BETA HCG BLOOD, ED (MC, WL, AP ONLY): I-stat hCG, quantitative: <5 m[IU]/mL (ref <5)

## 2020-07-31 LAB — BRAIN NATRIURETIC PEPTIDE: B Natriuretic Peptide: 307.6 pg/mL — ABNORMAL HIGH (ref 0.0–100.0)

## 2020-07-31 LAB — TSH: TSH: 4.49 u[IU]/mL (ref 0.350–4.500)

## 2020-07-31 LAB — CREATININE, URINE, RANDOM: Creatinine, Urine: 201.63 mg/dL

## 2020-07-31 LAB — T4, FREE: Free T4: 0.95 ng/dL (ref 0.61–1.12)

## 2020-07-31 LAB — LACTIC ACID, PLASMA: Lactic Acid, Venous: 1.6 mmol/L (ref 0.5–1.9)

## 2020-07-31 LAB — MRSA PCR SCREENING: MRSA by PCR: POSITIVE — AB

## 2020-07-31 LAB — AMMONIA: Ammonia: 19 umol/L (ref 9–35)

## 2020-07-31 MED ORDER — GABAPENTIN 300 MG PO CAPS: 300.0000 mg | ORAL_CAPSULE | Freq: Every day | ORAL | Status: DC

## 2020-07-31 MED ORDER — GABAPENTIN 300 MG PO CAPS
300.0000 mg | ORAL_CAPSULE | Freq: Two times a day (BID) | ORAL | Status: DC
  Administered 2020-07-31 – 2020-08-03 (×8): 300 mg via ORAL
  Filled 2020-07-31 (×8): qty 1

## 2020-07-31 MED ORDER — INSULIN GLARGINE 100 UNIT/ML ~~LOC~~ SOLN
5.0000 [IU] | Freq: Every day | SUBCUTANEOUS | Status: DC
  Administered 2020-07-31 – 2020-08-02 (×3): 5 [IU] via SUBCUTANEOUS
  Filled 2020-07-31 (×4): qty 0.05

## 2020-07-31 MED ORDER — CHLORHEXIDINE GLUCONATE CLOTH 2 % EX PADS
6.0000 | MEDICATED_PAD | Freq: Every day | CUTANEOUS | Status: DC
  Administered 2020-07-31 – 2020-08-03 (×4): 6 via TOPICAL

## 2020-07-31 MED ORDER — LEVOTHYROXINE SODIUM 88 MCG PO TABS
88.0000 ug | ORAL_TABLET | Freq: Every day | ORAL | Status: DC
  Administered 2020-07-31 – 2020-08-03 (×4): 88 ug via ORAL
  Filled 2020-07-31 (×4): qty 1

## 2020-07-31 MED ORDER — SODIUM POLYSTYRENE SULFONATE 15 GM/60ML PO SUSP
30.0000 g | Freq: Once | ORAL | Status: AC
  Administered 2020-07-31: 30 g via ORAL
  Filled 2020-07-31: qty 120

## 2020-07-31 MED ORDER — POLYETHYLENE GLYCOL 3350 17 G PO PACK: 17.0000 g | PACK | Freq: Every day | ORAL | Status: DC | PRN

## 2020-07-31 MED ORDER — SODIUM ZIRCONIUM CYCLOSILICATE 10 G PO PACK
10.0000 g | PACK | Freq: Two times a day (BID) | ORAL | Status: AC
  Administered 2020-07-31 (×2): 10 g via ORAL
  Filled 2020-07-31 (×2): qty 1

## 2020-07-31 MED ORDER — CARVEDILOL 12.5 MG PO TABS
12.5000 mg | ORAL_TABLET | Freq: Two times a day (BID) | ORAL | Status: DC
  Administered 2020-07-31 – 2020-08-03 (×7): 12.5 mg via ORAL
  Filled 2020-07-31 (×7): qty 1

## 2020-07-31 MED ORDER — MUPIROCIN 2 % EX OINT
1.0000 "application " | TOPICAL_OINTMENT | Freq: Two times a day (BID) | CUTANEOUS | Status: DC
  Administered 2020-07-31 – 2020-08-03 (×7): 1 via NASAL
  Filled 2020-07-31 (×2): qty 22

## 2020-07-31 MED ORDER — ATORVASTATIN CALCIUM 10 MG PO TABS
10.0000 mg | ORAL_TABLET | Freq: Every day | ORAL | Status: DC
  Administered 2020-07-31 – 2020-08-02 (×3): 10 mg via ORAL
  Filled 2020-07-31 (×3): qty 1

## 2020-07-31 MED ORDER — ADULT MULTIVITAMIN W/MINERALS CH
1.0000 | ORAL_TABLET | Freq: Every day | ORAL | Status: DC
  Administered 2020-07-31 – 2020-08-03 (×4): 1 via ORAL
  Filled 2020-07-31 (×4): qty 1

## 2020-07-31 MED ORDER — EZETIMIBE 10 MG PO TABS
10.0000 mg | ORAL_TABLET | Freq: Every day | ORAL | Status: DC
  Administered 2020-07-31 – 2020-08-03 (×4): 10 mg via ORAL
  Filled 2020-07-31 (×5): qty 1

## 2020-07-31 MED ORDER — VITAMIN D 25 MCG (1000 UNIT) PO TABS
2000.0000 [IU] | ORAL_TABLET | Freq: Every day | ORAL | Status: DC
  Administered 2020-07-31 – 2020-08-03 (×4): 2000 [IU] via ORAL
  Filled 2020-07-31 (×4): qty 2

## 2020-07-31 MED ORDER — LEVOFLOXACIN IN D5W 750 MG/150ML IV SOLN
750.0000 mg | Freq: Once | INTRAVENOUS | Status: AC
  Administered 2020-07-31: 750 mg via INTRAVENOUS
  Filled 2020-07-31: qty 150

## 2020-07-31 MED ORDER — RIVAROXABAN 15 MG PO TABS
15.0000 mg | ORAL_TABLET | Freq: Every evening | ORAL | Status: DC
  Administered 2020-07-31 – 2020-08-02 (×3): 15 mg via ORAL
  Filled 2020-07-31 (×3): qty 1

## 2020-07-31 MED ORDER — INSULIN ASPART 100 UNIT/ML ~~LOC~~ SOLN
0.0000 [IU] | Freq: Three times a day (TID) | SUBCUTANEOUS | Status: DC
  Administered 2020-07-31 (×2): 1 [IU] via SUBCUTANEOUS
  Administered 2020-08-01: 5 [IU] via SUBCUTANEOUS
  Administered 2020-08-01: 3 [IU] via SUBCUTANEOUS
  Administered 2020-08-02: 1 [IU] via SUBCUTANEOUS
  Administered 2020-08-02 – 2020-08-03 (×2): 2 [IU] via SUBCUTANEOUS

## 2020-07-31 MED ORDER — ACETAMINOPHEN 325 MG PO TABS: 650.0000 mg | ORAL_TABLET | Freq: Four times a day (QID) | ORAL | Status: DC | PRN

## 2020-07-31 MED ORDER — DOCUSATE SODIUM 100 MG PO CAPS
100.0000 mg | ORAL_CAPSULE | Freq: Every day | ORAL | Status: DC
  Administered 2020-07-31 – 2020-08-03 (×3): 100 mg via ORAL
  Filled 2020-07-31 (×3): qty 1

## 2020-07-31 MED ORDER — LEVOFLOXACIN IN D5W 500 MG/100ML IV SOLN: 500.0000 mg | INTRAVENOUS | Status: DC

## 2020-07-31 MED ORDER — ALBUTEROL SULFATE HFA 108 (90 BASE) MCG/ACT IN AERS: 2.0000 | INHALATION_SPRAY | Freq: Four times a day (QID) | RESPIRATORY_TRACT | Status: DC | PRN

## 2020-07-31 MED ORDER — BUPROPION HCL ER (XL) 150 MG PO TB24
300.0000 mg | ORAL_TABLET | Freq: Every day | ORAL | Status: DC
  Administered 2020-07-31 – 2020-08-03 (×4): 300 mg via ORAL
  Filled 2020-07-31 (×4): qty 2

## 2020-07-31 MED ORDER — LACTATED RINGERS IV BOLUS
500.0000 mL | Freq: Once | INTRAVENOUS | Status: AC
  Administered 2020-07-31: 500 mL via INTRAVENOUS

## 2020-07-31 MED ORDER — ACETAMINOPHEN 650 MG RE SUPP: 650.0000 mg | Freq: Four times a day (QID) | RECTAL | Status: DC | PRN

## 2020-07-31 MED ORDER — SERTRALINE HCL 100 MG PO TABS
100.0000 mg | ORAL_TABLET | Freq: Every day | ORAL | Status: DC
  Administered 2020-07-31 – 2020-08-03 (×4): 100 mg via ORAL
  Filled 2020-07-31 (×5): qty 1

## 2020-07-31 MED ORDER — GABAPENTIN 300 MG PO CAPS
600.0000 mg | ORAL_CAPSULE | Freq: Every day | ORAL | Status: DC
  Administered 2020-07-31 – 2020-08-02 (×3): 600 mg via ORAL
  Filled 2020-07-31 (×3): qty 2

## 2020-07-31 MED ORDER — PERFLUTREN LIPID MICROSPHERE
1.0000 mL | INTRAVENOUS | Status: AC | PRN
  Administered 2020-07-31: 2 mL via INTRAVENOUS
  Filled 2020-07-31: qty 10

## 2020-07-31 NOTE — Social Work (Addendum)
Called patient's nephew, Broadus John, unable to complete assessment  - left voice message to return call.   Thurmond Butts, MSW, [place] Clinical Social Worker

## 2020-07-31 NOTE — Progress Notes (Signed)
  Echocardiogram 2D Echocardiogram with definity has been performed.  Dawn Morrow M 07/31/2020, 9:22 AM

## 2020-07-31 NOTE — ED Notes (Signed)
Unable to get second set of blood cultures. Attempted by RN and phlebotomist.

## 2020-07-31 NOTE — H&P (Signed)
History and Physical    Dawn Morrow Dawn Morrow DOB: [birth year] DOA: 07/30/2020  PCP: System, Provider Not In Patient coming from: [facility] nursing home  Chief Complaint: Altered mental status  HPI: Dawn Morrow is a 84 y.o. female with medical history significant of dementia, frequent falls, anxiety and depression, asthma, COPD, chronic hypoxic and hypercapnic respiratory failure on 3 L home oxygen, diabetes, history of DVT and PE, persistent A. fib on Xarelto, stroke, hypertension, hyperlipidemia, hypothyroidism, CKD stage IIIb presenting the ED via EMS from her nursing home for evaluation of altered mental status.  Nursing home staff reported that patient has been less responsive throughout the day and only oriented to self.  She is AAOx4 at baseline.  EMS reported no slurred speech or facial droop.  Patient is somnolent but easily arousable. Currently AAO x3 but appears confused and is not sure why she is here. She has no complaints. No history could be obtained from her.  ED Course: Afebrile.  Slightly tachycardic and tachypneic on arrival.  WBC 24.2 with left shift, hemoglobin 13.0, hematocrit 42.9, platelet 366K.  Sodium 139, potassium 5.2, chloride 99, bicarb 29, BUN 34, creatinine 1.8 (baseline 0.8), glucose 151, corrected calcium 10.2, LFTs normal.  Lactic acid within normal limits.  Ammonia level normal.  Blood culture x2 pending.  SARS-CoV-2 PCR test negative.  Influenza panel negative.  TSH and free T4 normal.  UA with large amount of leukocytes, greater than 50 WBCs, and many bacteria.  Urine culture pending.  Chest x-ray showing mild cardiomegaly with central vascular congestion, small left pleural effusion, and left basilar consolidation.  Head CT negative for acute intracranial abnormality.  Patient received 1 L normal saline bolus and IV Levaquin 750 mg.  Review of Systems:  All systems reviewed and apart from history of presenting illness, are negative.  Past Medical  History:  Diagnosis Date  . Anxiety and depression   . Asthma   . Cataract   . COPD (chronic obstructive pulmonary disease) ([place])   . Diabetes mellitus   . Diverticulosis   . DVT (deep vein thrombosis) in pregnancy    hx x multiple on coumadin  . GERD (gastroesophageal reflux disease)   . Hemorrhoids   . Hiatal hernia   . Hyperlipidemia   . Hypertension   . Hypothyroidism   . On home oxygen therapy    "2L; 24/7" (09/17/2017)  . Osteoarthritis    h/o spinal stenosis by MRI 2009  . Osteopenia   . Pulmonary embolism (HCC)    x 2  . Stroke [facility])    MINI  . Tubular adenoma of colon 07/2005    Past Surgical History:  Procedure Laterality Date  . APPENDECTOMY    . CATARACT EXTRACTION    . CHOLECYSTECTOMY    . LUMBAR FUSION    . POLYPECTOMY    . TONSILLECTOMY    . TUBAL LIGATION       reports that she has quit smoking. Her smoking use included cigarettes. She has never used smokeless tobacco. She reports that she does not drink alcohol and does not use drugs.  Allergies  Allergen Reactions  . Penicillins Anaphylaxis  . Codeine Other (See Comments)    Listed on MAR  . Sulfonylureas Other (See Comments)    Listed on MAR  . Sulfonamide Derivatives Other (See Comments)    Unknown reaction    Family History  Adopted: Yes  Problem Relation Age of Onset  . Cancer Mother        ?  colon or ovarian  . Diabetes Mother   . Cancer Brother        ?  . CAD Neg Hx     Prior to Admission medications   Medication Sig Start Date End Date Taking? Authorizing Provider  acetaminophen (TYLENOL) 500 MG tablet Take 500 mg by mouth every 4 (four) hours as needed (pain).    Yes [provider]  albuterol (PROVENTIL HFA;VENTOLIN HFA) 108 (90 BASE) MCG/ACT inhaler Inhale 2 puffs into the lungs every 6 (six) hours as needed for wheezing or shortness of breath. 08/05/15  Yes Saguier, Percell Miller, PA-C  atorvastatin (LIPITOR) 10 MG tablet Take 1 tablet (10 mg total) by mouth at bedtime.  10/21/15  Yes Paz, Alda Berthold, MD  buPROPion (WELLBUTRIN XL) 300 MG 24 hr tablet Take 1 tablet (300 mg total) by mouth daily. 07/20/20  Yes [name], MD  carvedilol (COREG) 12.5 MG tablet Take 1 tablet (12.5 mg total) by mouth 2 (two) times daily with a meal. 05/06/20  Yes Dessa Phi, DO  Cholecalciferol (VITAMIN D) 50 MCG (2000 UT) tablet Take 2,000 Units by mouth daily.   Yes [provider]  docusate sodium (COLACE) 100 MG capsule Take 100 mg by mouth daily.   Yes [provider]  ezetimibe (ZETIA) 10 MG tablet Take 10 mg by mouth daily.   Yes [provider]  gabapentin (NEURONTIN) 300 MG capsule Take 1 cap in AM, 1 cap at noon, 2 caps at bedtime Patient taking differently: Take 300-600 mg by mouth at bedtime. Take 1 capsule (300 mg) BID and Take 2 capsules (600 mg) at bedtime 12/21/14  Yes Cameron Sprang, MD  insulin aspart (NOVOLOG FLEXPEN) 100 UNIT/ML FlexPen Inject 0-12 Units into the skin See admin instructions. Inject 0-12 units subcutaneously four times daily - before meals and at bedtime - per sliding scale: CBG 0-150 0 units, 151-200 4 units, 201-250 6 units, 251-300 8 units, 301-350 10 units, 351-400 12 units, >400 12 units   Yes [provider]  Insulin Glargine (BASAGLAR KWIKPEN) 100 UNIT/ML Inject 5 Units into the skin at bedtime. 07/19/20  Yes [name], MD  ketoconazole (NIZORAL) 2 % shampoo Apply 1 application topically See admin instructions. Use topically to shampoo hair twice weekly - Tuesday and Friday   Yes [provider]  levothyroxine (SYNTHROID) 88 MCG tablet Take 88 mcg by mouth daily before breakfast.   Yes [provider]  LORazepam (ATIVAN) 0.5 MG tablet Take 1 tablet (0.5 mg total) by mouth every 8 (eight) hours as needed for anxiety. 07/19/20  Yes [name], MD  meclizine (ANTIVERT) 25 MG tablet Take 25 mg by mouth at bedtime as needed for dizziness.   Yes [provider]  Multiple Vitamin  (MULTIVITAMIN WITH MINERALS) TABS tablet Take 1 tablet by mouth daily. 07/20/20  Yes [name], MD  nystatin (NYSTATIN) powder Apply 1 application topically 2 (two) times daily.   Yes [provider]  ondansetron (ZOFRAN) 4 MG tablet Take 4 mg by mouth every 8 (eight) hours as needed for nausea or vomiting.   Yes [provider]  OXYGEN Inhale 3 L into the lungs continuous.   Yes [provider]  polyethylene glycol (MIRALAX / GLYCOLAX) 17 g packet Take 17 g by mouth daily as needed for mild constipation or moderate constipation.   Yes [provider]  Propylene Glycol (SYSTANE BALANCE) 0.6 % SOLN Place 1 drop into both eyes 4 (four) times daily as needed (  for dry eyes).    Yes [provider]  rivaroxaban (XARELTO) 20 MG TABS tablet Take 1 tablet (20 mg total) by mouth daily with supper. Patient taking differently: Take 20 mg by mouth every evening.  09/14/14  Yes Paz, Alda Berthold, MD  sertraline (ZOLOFT) 100 MG tablet Take 1 tablet (100 mg total) by mouth daily. 07/20/20  Yes [name], MD  clindamycin (CLEOCIN) 300 MG capsule Take 1 capsule (300 mg total) by mouth 3 (three) times daily. Patient not taking: Reported on 07/02/2017 06/17/17 07/02/17  Colon Branch, MD    Physical Exam: Vitals:   07/31/20 0045 07/31/20 0110 07/31/20 0145 07/31/20 0300  BP: 111/67  110/67 (!) 147/99  Pulse: (!) 136  84 (!) 48  Resp: (!) 22  19 16   Temp:  99.6 F (37.6 C)    TempSrc:  Rectal    SpO2:   100% 95%    Physical Exam Constitutional:      General: She is not in acute distress. HENT:     Head: Normocephalic and atraumatic.     Mouth/Throat:     Mouth: Mucous membranes are dry.  Eyes:     Conjunctiva/sclera: Conjunctivae normal.  Cardiovascular:     Rate and Rhythm: Normal rate and regular rhythm.     Pulses: Normal pulses.  Pulmonary:     Effort: Pulmonary effort is normal.     Breath sounds: No wheezing.     Comments: Satting 100% on 3 L  supplemental oxygen via nasal cannula Abdominal:     General: Bowel sounds are normal. There is no distension.     Palpations: Abdomen is soft.     Tenderness: There is no abdominal tenderness.  Musculoskeletal:        General: No swelling or tenderness.     Cervical back: Normal range of motion and neck supple.  Skin:    General: Skin is warm and dry.  Neurological:     Mental Status: She is alert and oriented to person, place, and time.     Comments: Moving all extremities on command, no focal motor deficit.     Labs on Admission: I have personally reviewed following labs and imaging studies  CBC: Recent Labs  Lab 07/30/20 2344 07/31/20 0023  WBC 24.2*  --   NEUTROABS 21.1*  --   HGB 13.0 14.6  HCT 42.9 43.0  MCV 95.3  --   PLT 366  --    Basic Metabolic Panel: Recent Labs  Lab 07/30/20 2344 07/31/20 0023  NA 139 139  K 5.2* 5.1  CL 99 100  CO2 29  --   GLUCOSE 151* 142*  BUN 34* 37*  CREATININE 1.83* 1.80*  CALCIUM 9.3  --    GFR: CrCl cannot be calculated (Unknown ideal weight.). Liver Function Tests: Recent Labs  Lab 07/30/20 2344  AST 32  ALT 35  ALKPHOS 88  BILITOT 0.9  PROT 6.8  ALBUMIN 2.9*   No results for input(s): LIPASE, AMYLASE in the last 168 hours. Recent Labs  Lab 07/30/20 2334  AMMONIA 19   Coagulation Profile: No results for input(s): INR, PROTIME in the last 168 hours. Cardiac Enzymes: No results for input(s): CKTOTAL, CKMB, CKMBINDEX, TROPONINI in the last 168 hours. BNP (last 3 results) No results for input(s): PROBNP in the last 8760 hours. HbA1C: No results for input(s): HGBA1C in the last 72 hours. CBG: Recent Labs  Lab 07/31/20 0051  GLUCAP 121*   Lipid Profile:  No results for input(s): CHOL, HDL, LDLCALC, TRIG, CHOLHDL, LDLDIRECT in the last 72 hours. Thyroid Function Tests: Recent Labs    07/30/20 2344  TSH 4.490  FREET4 0.95   Anemia Panel: No results for input(s): VITAMINB12, FOLATE, FERRITIN, TIBC,  IRON, RETICCTPCT in the last 72 hours. Urine analysis:    Component Value Date/Time   COLORURINE AMBER (A) 07/31/2020 0112   APPEARANCEUR CLOUDY (A) 07/31/2020 0112   LABSPEC 1.021 07/31/2020 0112   PHURINE 6.0 07/31/2020 0112   GLUCOSEU NEGATIVE 07/31/2020 0112   GLUCOSEU NEGATIVE 06/12/2014 1410   HGBUR MODERATE (A) 07/31/2020 0112   BILIRUBINUR SMALL (A) 07/31/2020 0112   BILIRUBINUR Neg 06/19/2016 1101   KETONESUR NEGATIVE 07/31/2020 0112   PROTEINUR 30 (A) 07/31/2020 0112   UROBILINOGEN >=8.0 06/19/2016 1101   UROBILINOGEN 0.2 05/31/2015 1835   NITRITE NEGATIVE 07/31/2020 0112   LEUKOCYTESUR LARGE (A) 07/31/2020 0112    Radiological Exams on Admission: CT HEAD WO CONTRAST  Result Date: 07/31/2020 CLINICAL DATA:  Mental status change of unknown cause. History of hypertension and diabetes. EXAM: CT HEAD WITHOUT CONTRAST TECHNIQUE: Contiguous axial images were obtained from the base of the skull through the vertex without intravenous contrast. COMPARISON:  07/16/2020 FINDINGS: Brain: Mild cerebral atrophy. Mild ventricular dilatation consistent with central atrophy. Prominent low-attenuation changes throughout the deep white matter consistent with small vessel ischemia. No mass-effect or midline shift. No abnormal extra-axial fluid collections. The gray-white matter junctions are distinct. Basal cisterns are not effaced. No acute intracranial hemorrhage. Vascular: Moderate intracranial arterial vascular calcifications. Skull: The calvarium appears intact. No acute depressed skull fractures. Sinuses/Orbits: Paranasal sinuses and mastoid air cells are clear. Other: Dense subcutaneous soft tissue nodule over the left parietal convexity likely representing a pilonidal cyst. IMPRESSION: 1. No acute intracranial abnormalities. 2. Chronic atrophy and small vessel ischemia. Electronically Signed   By: Lucienne Capers M.D.   On: 07/31/2020 00:45   DG Chest Port 1 View  Result Date:  07/30/2020 CLINICAL DATA:  Less responsive EXAM: PORTABLE CHEST 1 VIEW COMPARISON:  07/09/2020 FINDINGS: Mild cardiomegaly with central vascular congestion. Small left-sided pleural effusion. Left basilar consolidation. Aortic atherosclerosis. No pneumothorax. IMPRESSION: Mild cardiomegaly with central vascular congestion and small left pleural effusion. Left basilar consolidation. Electronically Signed   By: Donavan Foil M.D.   On: 07/30/2020 23:49    EKG: Independently reviewed.  A. fib.  Assessment/Plan Principal Problem:   UTI (urinary tract infection) Active Problems:   AKI (acute kidney injury) ([place])   Sepsis ([place])   Acute metabolic encephalopathy   CAP (community acquired pneumonia)   Sepsis UTI CAP Meets sepsis criteria - 3 SIRS (tachycardia, tachypnea, WBC 24.2 with left shift) and source of infection present.  No lactic acidosis or hypotension to suggest severe sepsis.  UA with large amount of leukocytes, greater than 50 WBCs, and many bacteria.  Chest x-ray showing left basilar consolidation.  SARS-CoV-2 PCR test and influenza panel negative. -Continue Levaquin (has a high risk penicillin allergy).  Urine and blood cultures pending.  Continue to monitor WBC count.  Received 1 L fluid bolus in the ED and tachycardia is now resolved.  Will avoid giving additional fluid at this time given signs of vascular congestion on chest x-ray.  AKI on CKD stage IIIb: BUN 34, creatinine 1.8 (baseline 0.8).  Possibly prerenal from dehydration vs ?cardiorenal from decompensated CHF. -Received 1 L fluid bolus in the ED.  Will hold off giving additional fluid at this time given signs of vascular  congestion on chest x-ray.  Avoid nephrotoxic agents.  Check urine sodium and creatinine.  Monitor renal function and urine output.  Acute metabolic encephalopathy: Likely secondary to underlying infection (UTI and CAP) and AKI.  Head CT negative for acute intracranial abnormality.  No focal neuro deficit on  exam.  Ammonia level normal.  TSH and free T4 normal. -Management of UTI and CAP as mentioned above, continue to monitor  Acute on chronic diastolic CHF: Last echo done in November 2019 showing normal LVEF of 60 to 73% and diastolic parameters could not be evaluated due to A. fib.  Chest x-ray showing mild cardiomegaly, central vascular congestion, and small left pleural effusion. No peripheral edema on exam. -Patient received 1 L fluid bolus in the ED for AKI.  Check BNP level, order echocardiogram.  Anxiety and depression -Continue home bupropion, Zoloft  COPD Chronic hypoxic and hypercapnic respiratory failure Currently stable on 3 L home oxygen. -Continue home oxygen, continuous pulse ox  Well-controlled insulin-dependent diabetes: A1c 5.1 on 07/11/2020. -Sliding scale insulin sensitive with meals, continue home basal insulin   Persistent A. fib: Currently rate controlled. -Continue Xarelto for anticoagulation and Coreg for rate control  Hypertension: Stable.  Currently normotensive. -Continue home Coreg  Hyperlipidemia -Continue Lipitor, Zetia  Hypothyroidism: TSH and free T4 normal. -Continue home Synthroid  DVT prophylaxis: Xarelto Code Status: DNR/DNI.  Please see notes from recent hospitalization, patient was seen by palliative care. Family Communication: No family available at this time. Disposition Plan: Status is: Inpatient  Remains inpatient appropriate because:Altered mental status, IV treatments appropriate due to intensity of illness or inability to take PO and Inpatient level of care appropriate due to severity of illness   Dispo: The patient is from: SNF              Anticipated d/c is to: SNF              Anticipated d/c date is: 3 days              Patient currently is not medically stable to d/c.  The medical decision making on this patient was of high complexity and the patient is at high risk for clinical deterioration, therefore this is a level 3  visit.  Shela Leff MD [facility]  If 7PM-7AM, please contact night-coverage [email or web address]  07/31/2020, 3:06 AM

## 2020-07-31 NOTE — Sepsis Progress Note (Signed)
Following for Code Sepsis Monitoring

## 2020-07-31 NOTE — Progress Notes (Signed)
NT checked pt's CBG at 2130: results less than 10. Checked on pt, pt alert, no symptoms of low blood sugar.  NT was checking CBG again when nurse went into room to  assess pt at 2132. CBG results: 118. Will continue to monitor pt. Racheal Patches, RN

## 2020-07-31 NOTE — ED Notes (Signed)
Patient transported to CT 

## 2020-07-31 NOTE — Progress Notes (Signed)
PROGRESS NOTE                                                                                                                                                                                                             Patient Demographics:    Dawn Morrow, is a 84 y.o. female, DOB - Jun 11, 1936, JQZ:[id number]  Outpatient Primary MD for the patient is Wenda Low, MD    LOS - 0  Admit date - 07/30/2020    Chief Complaint  Patient presents with  . Altered Mental Status       Brief Narrative (HPI from H&P) - Dawn Morrow is a 84 y.o. female with medical history significant of dementia, frequent falls, anxiety and depression, asthma, COPD, chronic hypoxic and hypercapnic respiratory failure on 3 L home oxygen, diabetes, history of DVT and PE, persistent A. fib on Xarelto, stroke, hypertension, hyperlipidemia, hypothyroidism, CKD stage IIIb presenting the ED via EMS from her nursing home for evaluation of altered status, she has been bedbound for about 2 months and has had recurrent UTIs.  In the ER was found to have UTI and admitted to the hospital.   Subjective:    Dawn Morrow today has, No headache, No chest pain, No abdominal pain - No Nausea, No new weakness tingling or numbness, no SOB.   Assessment  & Plan :      1.  Sepsis due to UTI and possible pneumonia causing toxic encephalopathy in a dementia and who is largely bedbound for the last few mths.  Patient has had recurrent bouts of UTI recently, for the last few months she is being bedbound at the SNF, this information was obtained through nephew Wille Glaser.  She has been treated with IV antibiotics and IV fluids, sepsis pathophysiology seems to have resolved, speech therapy to evaluate.  Continue gentle hydration follow cultures and procalcitonin, will monitor postvoid residuals.  Total antibiotic course should be 5 days if clinically better.   2.  AKI on CKD 3B.   Creatinine baseline 1.8, hydrate and monitor.  AKI has improved.  3.  Hyperkalemia.  Kayexalate and Lokelma.  4.  Chronic diastolic CHF EF 62%.  Although there is some hint of vascular congestion on chest x-ray along with elevated BNP clinically she is dehydrated, hydrate and monitor.  5.  Anxiety and depression.  On  bupropion and Zoloft continue.  6.  History of COPD.  No acute issues supportive care.  7.  Chronic A. fib.  Mali vas 2 score of greater than 3.  Low-dose Coreg along with Xarelto.  8.  Hypertension.  Blood pressure soft, Coreg dose adjusted.  Hydrate and monitor.  9.  Dyslipidemia.  On combination of Lipitor and Zetia.  10.  Hypothyroidism.  On Synthroid.  Stable TSH.   Condition -  Guarded  Family Communication  : Foy [place] [phone] on 07/31/2020  Code Status :  DNR  Consults  :  None  Procedures  :    CT Head - Non acute  PUD Prophylaxis : None  Disposition Plan  :    Status is: Inpatient  Remains inpatient appropriate because:IV treatments appropriate due to intensity of illness or inability to take PO   Dispo: The patient is from: SNF              Anticipated d/c is to: SNF              Anticipated d/c date is: 3 days              Patient currently is not medically stable to d/c.      DVT Prophylaxis  :  Xaralto  Lab Results  Component Value Date   PLT 366 07/31/2020    Diet :  Diet Order            Diet heart healthy/carb modified Room service appropriate? Yes; Fluid consistency: Thin  Diet effective now                  Inpatient Medications  Scheduled Meds: . atorvastatin  10 mg Oral QHS  . buPROPion  300 mg Oral Daily  . carvedilol  12.5 mg Oral BID WC  . Chlorhexidine Gluconate Cloth  6 each Topical Q0600  . cholecalciferol  2,000 Units Oral Daily  . docusate sodium  100 mg Oral Daily  . ezetimibe  10 mg Oral Daily  . gabapentin  300 mg Oral BID   And  . gabapentin  600 mg Oral QHS  . insulin aspart  0-9 Units  Subcutaneous TID WC  . insulin glargine  5 Units Subcutaneous QHS  . levothyroxine  88 mcg Oral QAC breakfast  . multivitamin with minerals  1 tablet Oral Daily  . mupirocin ointment  1 application Nasal BID  . Rivaroxaban  15 mg Oral QPM  . sertraline  100 mg Oral Daily  . sodium zirconium cyclosilicate  10 g Oral BID   Continuous Infusions: . [START ON 08/01/2020] levofloxacin (LEVAQUIN) IV     PRN Meds:.acetaminophen **OR** acetaminophen, albuterol, polyethylene glycol  Antibiotics  :    Anti-infectives (From admission, onward)   Start     Dose/Rate Route Frequency Ordered Stop   08/01/20 2200  levofloxacin (LEVAQUIN) IVPB 500 mg        500 mg 100 mL/hr over 60 Minutes Intravenous Every 48 hours 07/31/20 0326     07/31/20 0100  levofloxacin (LEVAQUIN) IVPB 750 mg        750 mg 100 mL/hr over 90 Minutes Intravenous  Once 07/31/20 0055 07/31/20 0352       Time Spent in minutes  30   Lala Lund M.D on 07/31/2020 at 8:52 AM  To page go to [email or web address] - password [email or web address]  [facility] -  Office  [phone]   See all Orders from today for further details  Objective:   Vitals:   07/31/20 0315 07/31/20 0400 07/31/20 0441 07/31/20 0737  BP: (!) 113/100 105/63 (!) 114/53 108/61  Pulse: (!) 146 (!) 146 98 91  Resp: 19 19 17 14   Temp:   98.1 F (36.7 C) 98.3 F (36.8 C)  TempSrc:   Oral Axillary  SpO2: 99% 97% 96% 96%  Weight:   85.3 kg   Height:   5' 5.5" (1.664 m)     Wt Readings from Last 3 Encounters:  07/31/20 85.3 kg  07/12/20 84.3 kg  06/19/20 88.9 kg     Intake/Output Summary (Last 24 hours) at 07/31/2020 0852 Last data filed at 07/31/2020 0454 Gross per 24 hour  Intake 1628.2 ml  Output --  Net 1628.2 ml     Physical Exam  Awake, confused, moves all 4 extremities but ++ weak all over [place].AT,PERRAL Supple Neck,No JVD, No cervical lymphadenopathy appriciated.  Symmetrical Chest wall movement, Good air movement bilaterally,  CTAB RRR,No Gallops,Rubs or new Murmurs, No Parasternal Heave +ve B.Sounds, Abd Soft, No tenderness, No organomegaly appriciated, No rebound - guarding or rigidity. No Cyanosis, Clubbing or edema, No new Rash or bruise      Data Review:    CBC Recent Labs  Lab 07/30/20 2344 07/31/20 0023 07/31/20 0359  WBC 24.2*  --  23.8*  HGB 13.0 14.6 11.4*  HCT 42.9 43.0 38.4  PLT 366  --  366  MCV 95.3  --  95.3  MCH 28.9  --  28.3  MCHC 30.3  --  29.7*  RDW 16.6*  --  16.7*  LYMPHSABS 2.2  --   --   MONOABS 0.7  --   --   EOSABS 0.0  --   --   BASOSABS 0.1  --   --     Recent Labs  Lab 07/30/20 2334 07/30/20 2344 07/31/20 0023 07/31/20 0359 07/31/20 0754  NA  --  139 139 139  --   K  --  5.2* 5.1 5.3*  --   CL  --  99 100 99  --   CO2  --  29  --  30  --   GLUCOSE  --  151* 142* 148*  --   BUN  --  34* 37* 33*  --   CREATININE  --  1.83* 1.80* 1.75*  --   CALCIUM  --  9.3  --  8.8*  --   AST  --  32  --   --   --   ALT  --  35  --   --   --   ALKPHOS  --  88  --   --   --   BILITOT  --  0.9  --   --   --   ALBUMIN  --  2.9*  --   --   --   PROCALCITON  --   --   --   --  0.11  LATICACIDVEN 1.6  --   --   --   --   TSH  --  4.490  --   --   --   AMMONIA 19  --   --   --   --   BNP  --   --   --  307.6*  --     ------------------------------------------------------------------------------------------------------------------ No results for input(s): CHOL, HDL, LDLCALC, TRIG, CHOLHDL, LDLDIRECT in the last 72 hours.  Lab Results  Component Value Date   HGBA1C 5.1 07/11/2020   ------------------------------------------------------------------------------------------------------------------  Recent Labs    07/30/20 2344  TSH 4.490    Cardiac Enzymes No results for input(s): CKMB, TROPONINI, MYOGLOBIN in the last 168 hours.  Invalid input(s): CK ------------------------------------------------------------------------------------------------------------------     Component Value Date/Time   BNP 307.6 (H) 07/31/2020 0359    Micro Results Recent Results (from the past 240 hour(s))  Respiratory Panel by RT PCR (Flu A&B, Covid) - Nasopharyngeal Swab     Status: None   Collection Time: 07/30/20 11:44 PM   Specimen: Nasopharyngeal Swab; Nasopharyngeal(NP) swabs in vial transport medium  Result Value Ref Range Status   SARS Coronavirus 2 by RT PCR NEGATIVE NEGATIVE Final    Comment: (NOTE) SARS-CoV-2 target nucleic acids are NOT DETECTED.  The SARS-CoV-2 RNA is generally detectable in upper respiratoy specimens during the acute phase of infection. The lowest concentration of SARS-CoV-2 viral copies this assay can detect is 131 copies/mL. A negative result does not preclude SARS-Cov-2 infection and should not be used as the sole basis for treatment or other patient management decisions. A negative result may occur with  improper specimen collection/handling, submission of specimen other than nasopharyngeal swab, presence of viral mutation(s) within the areas targeted by this assay, and inadequate number of viral copies (<131 copies/mL). A negative result must be combined with clinical observations, patient history, and epidemiological information. The expected result is Negative.  Fact Sheet for Patients:  PinkCheek.be  Fact Sheet for Healthcare Providers:  GravelBags.it  This test is no t yet approved or cleared by the [place] FDA and  has been authorized for detection and/or diagnosis of SARS-CoV-2 by FDA under an Emergency Use Authorization (EUA). This EUA will remain  in effect (meaning this test can be used) for the duration of the COVID-19 declaration under Section 564(b)(1) of the Act, 21 U.S.C. section 360bbb-3(b)(1), unless the authorization is terminated or revoked sooner.     Influenza A by PCR NEGATIVE NEGATIVE Final   Influenza B by PCR NEGATIVE NEGATIVE Final     Comment: (NOTE) The Xpert Xpress SARS-CoV-2/FLU/RSV assay is intended as an aid in  the diagnosis of influenza from Nasopharyngeal swab specimens and  should not be used as a sole basis for treatment. Nasal washings and  aspirates are unacceptable for Xpert Xpress SARS-CoV-2/FLU/RSV  testing.  Fact Sheet for Patients: PinkCheek.be  Fact Sheet for Healthcare Providers: GravelBags.it  This test is not yet approved or cleared by the [place] FDA and  has been authorized for detection and/or diagnosis of SARS-CoV-2 by  FDA under an Emergency Use Authorization (EUA). This EUA will remain  in effect (meaning this test can be used) for the duration of the  Covid-19 declaration under Section 564(b)(1) of the Act, 21  U.S.C. section 360bbb-3(b)(1), unless the authorization is  terminated or revoked. Performed at [facility], [place] [address], [place], [place] [address]   MRSA PCR Screening     Status: Abnormal   Collection Time: 07/31/20  5:40 AM   Specimen: Nasal Mucosa; Nasopharyngeal  Result Value Ref Range Status   MRSA by PCR POSITIVE (A) NEGATIVE Final    Comment:        The GeneXpert MRSA Assay (FDA approved for NASAL specimens only), is one component of a comprehensive MRSA colonization surveillance program. It is not intended to diagnose MRSA infection nor to guide or monitor treatment for MRSA infections. CRITICAL RESULT CALLED TO, READ BACK BY AND VERIFIED WITH: RN LIANA K. AT 5852 07/31/20 BY MM. Performed at [facility], [place] [address], [place], [place] [address]     Radiology Reports DG Chest 1 View  Result Date: 07/09/2020 CLINICAL DATA:  Fall. Additional history provided: Former smoker, COPD, hypertension, history of tubular adenoma of colon 2006, history of asthma. EXAM: CHEST  1 VIEW COMPARISON:  Prior chest radiographs 04/27/2020. FINDINGS: Shallow inspiration radiograph. Cardiomegaly.  Aortic atherosclerosis. Chronic prominence of the interstitial lung markings. No appreciable airspace consolidation. No evidence of pleural effusion or pneumothorax. No acute bony abnormality identified. Partially imaged metallic foreign body projecting over the upper lumbar spine, appearing most suggestive of an IVC filter. IMPRESSION: Shallow inspiration radiograph. No evidence of acute cardiopulmonary abnormality. Chronic prominence of the interstitial lung markings. Unchanged cardiomegaly. Aortic Atherosclerosis (ICD10-I70.0). Electronically Signed   By: Kellie Simmering DO   On: 07/09/2020 17:43   CT HEAD WO CONTRAST  Result Date: 07/31/2020 CLINICAL DATA:  Mental status change of unknown cause. History of hypertension and diabetes. EXAM: CT HEAD WITHOUT CONTRAST TECHNIQUE: Contiguous axial images were obtained from the base of the skull through the vertex without intravenous contrast. COMPARISON:  07/16/2020 FINDINGS: Brain: Mild cerebral atrophy. Mild ventricular dilatation consistent with central atrophy. Prominent low-attenuation changes throughout the deep white matter consistent with small vessel ischemia. No mass-effect or midline shift. No abnormal extra-axial fluid collections. The gray-white matter junctions are distinct. Basal cisterns are not effaced. No acute intracranial hemorrhage. Vascular: Moderate intracranial arterial vascular calcifications. Skull: The calvarium appears intact. No acute depressed skull fractures. Sinuses/Orbits: Paranasal sinuses and mastoid air cells are clear. Other: Dense subcutaneous soft tissue nodule over the left parietal convexity likely representing a pilonidal cyst. IMPRESSION: 1. No acute intracranial abnormalities. 2. Chronic atrophy and small vessel ischemia. Electronically Signed   By: Lucienne Capers M.D.   On: 07/31/2020 00:45   CT HEAD WO CONTRAST  Result Date: 07/16/2020 CLINICAL DATA:  84 year old female with altered mental status. Neurologic  deficit. EXAM: CT HEAD WITHOUT CONTRAST TECHNIQUE: Contiguous axial images were obtained from the base of the skull through the vertex without intravenous contrast. COMPARISON:  Head CT 07/11/2020.  Brain MRI 12/16/2019. FINDINGS: Brain: Mild motion artifact today. No midline shift, mass effect, or evidence of intracranial mass lesion. Stable ventricle size and configuration. No ventriculomegaly. No acute intracranial hemorrhage identified. Advanced bilateral cerebral white matter hypodensity and including involvement of the deep white matter capsules. This appears similar to the prior MRI. Associated chronic heterogeneity of the bilateral deep gray nuclei. Stable gray-white matter differentiation throughout the brain. No cortically based acute infarct identified. Vascular: Calcified atherosclerosis at the skull base. No suspicious intracranial vascular hyperdensity. Skull: Mild motion artifact. No acute osseous abnormality identified. Sinuses/Orbits: Stable mild left mastoid effusion. Possible small volume retained secretions in the nasopharynx. Left tympanic cavity, other paranasal sinuses and mastoids remain clear. Other: Stable orbit and scalp soft tissues. IMPRESSION: 1. Mild motion artifact today. No acute or evolving infarct identified. Underlying severe chronic small vessel disease. 2. Stable mild left mastoid effusion, likely postinflammatory. Electronically Signed   By: Genevie Ann M.D.   On: 07/16/2020 16:57   CT Head Wo Contrast  Result Date: 07/11/2020 CLINICAL DATA:  Unwitnessed fall. EXAM: CT HEAD WITHOUT CONTRAST TECHNIQUE: Contiguous axial images were obtained from the base of the skull through the vertex without intravenous contrast. COMPARISON:  July 09, 2020. FINDINGS: Brain: Mild chronic ischemic white matter disease is noted. No mass effect or midline shift is noted. Ventricular size is within normal limits. There is no evidence of  mass lesion, hemorrhage or acute infarction. Vascular: No  hyperdense vessel or unexpected calcification. Skull: Normal. Negative for fracture or focal lesion. Sinuses/Orbits: No acute finding. Other: None. IMPRESSION: Mild chronic ischemic white matter disease. No acute intracranial abnormality seen. Electronically Signed   By: Marijo Conception M.D.   On: 07/11/2020 13:47   CT Head Wo Contrast  Result Date: 07/09/2020 CLINICAL DATA:  [place] EXAM: CT HEAD WITHOUT CONTRAST TECHNIQUE: Contiguous axial images were obtained from the base of the skull through the vertex without intravenous contrast. COMPARISON:  04/27/2020 FINDINGS: Brain: Chronic hypodensities within the periventricular white matter and bilateral basal ganglia are stable, consistent with chronic small vessel ischemic changes. No acute infarct or hemorrhage. Lateral ventricles and remaining midline structures are unremarkable. No acute extra-axial fluid collections. No mass effect. Vascular: No hyperdense vessel or unexpected calcification. Skull: Normal. Negative for fracture or focal lesion. Minimal residual left frontal scalp hematoma, markedly decreased since previous exam. Sinuses/Orbits: No acute finding. Other: None. IMPRESSION: 1. No acute intracranial process. 2. Stable chronic small-vessel ischemic changes throughout the white matter and bilateral basal ganglia. Electronically Signed   By: Randa Ngo M.D.   On: 07/09/2020 17:37   CT Hip Right Wo Contrast  Result Date: 07/11/2020 CLINICAL DATA:  Right hip pain after fall. EXAM: CT OF THE RIGHT HIP WITHOUT CONTRAST TECHNIQUE: Multidetector CT imaging of the right hip was performed according to the standard protocol. Multiplanar CT image reconstructions were also generated. COMPARISON:  CT right hip dated July 09, 2020. FINDINGS: Bones/Joint/Cartilage No fracture or dislocation. Unchanged mild right hip osteoarthritis. No joint effusion. Ligaments Ligaments are suboptimally evaluated by CT. Muscles and Tendons Grossly intact. Soft tissue No  fluid collection or hematoma. No soft tissue mass. Sigmoid colonic diverticulosis. IMPRESSION: 1. No acute osseous abnormality. Electronically Signed   By: Titus Dubin M.D.   On: 07/11/2020 13:40   CT Hip Right Wo Contrast  Result Date: 07/09/2020 CLINICAL DATA:  Fall in the way to the bathroom. Right hip/leg pain. EXAM: CT OF THE RIGHT HIP WITHOUT CONTRAST TECHNIQUE: Multidetector CT imaging of the right hip was performed according to the standard protocol. Multiplanar CT image reconstructions were also generated. COMPARISON:  Radiograph earlier today. FINDINGS: Bones/Joint/Cartilage No acute fracture. No dislocation. Right hip osteoarthritis with joint space narrowing and acetabular spurring. No avascular necrosis. No focal bone lesion. Degenerative change of the pubic symphysis with osteophytes. No significant hip joint effusion. Ligaments Suboptimally assessed by CT. Muscles and Tendons No intramuscular hematoma.  Muscle bulk is maintained. Soft tissues Mild skin thickening and soft tissue edema laterally. No confluent soft tissue hematoma. Diverticulosis in the sigmoid colon partially included. IMPRESSION: 1. No acute fracture or dislocation of the right hip. 2. Right hip osteoarthritis. Electronically Signed   By: Keith Rake M.D.   On: 07/09/2020 18:55   DG Chest Port 1 View  Result Date: 07/30/2020 CLINICAL DATA:  Less responsive EXAM: PORTABLE CHEST 1 VIEW COMPARISON:  07/09/2020 FINDINGS: Mild cardiomegaly with central vascular congestion. Small left-sided pleural effusion. Left basilar consolidation. Aortic atherosclerosis. No pneumothorax. IMPRESSION: Mild cardiomegaly with central vascular congestion and small left pleural effusion. Left basilar consolidation. Electronically Signed   By: Donavan Foil M.D.   On: 07/30/2020 23:49   DG Hip Unilat With Pelvis 2-3 Views Right  Result Date: 07/11/2020 CLINICAL DATA:  Right hip pain after a fall. EXAM: DG HIP (WITH OR WITHOUT PELVIS)  2-3V RIGHT COMPARISON:  Hip radiographs dated 07/09/2020. FINDINGS: There is no  evidence of hip fracture or dislocation. Mild degenerative changes are seen in both hips. IMPRESSION: No acute osseous injury. Electronically Signed   By: Zerita Boers M.D.   On: 07/11/2020 11:56   DG Hip Unilat W or Wo Pelvis 2-3 Views Right  Result Date: 07/09/2020 CLINICAL DATA:  Fall on the way to the bathroom.  Right hip pain. EXAM: DG HIP (WITH OR WITHOUT PELVIS) 2-3V RIGHT COMPARISON:  None. FINDINGS: The cortical margins of the bony pelvis and right hip are intact. No fracture. Pubic symphysis and sacroiliac joints are congruent. Both femoral heads are well-seated in the respective acetabula. Minimal degenerative spurring of the acetabulum. There may be soft tissue edema laterally versus habitus. IMPRESSION: No fracture of the pelvis or right hip. Electronically Signed   By: Keith Rake M.D.   On: 07/09/2020 17:40   DG Femur Min 2 Views Right  Result Date: 07/09/2020 CLINICAL DATA:  Recent fall with right leg pain, initial encounter EXAM: RIGHT FEMUR 2 VIEWS COMPARISON:  None. FINDINGS: Degenerative changes about the knee joint are noted most prominent medially. No acute fracture or dislocation is noted. No joint effusion is seen. No gross soft tissue abnormality is noted. IMPRESSION: Degenerative change without acute abnormality. Electronically Signed   By: Inez Catalina M.D.   On: 07/09/2020 18:29

## 2020-07-31 NOTE — ED Notes (Signed)
Urine culture sent down with UA. 

## 2020-07-31 NOTE — ED Provider Notes (Signed)
[facility] EMERGENCY DEPARTMENT Provider Note  CSN: [id number] Arrival date & time: 07/30/20 2239  Chief Complaint(s) Altered Mental Status  ED Triage Notes Jennette Banker, RN (Registered Nurse) . [facility] Emergency Medicine . Marland Kitchen Date of Service: 07/30/2020 10:49 PM . . Signed   Pt bib gems from [facility]es reports pt is has became less responsive throughout the day and only oriented to self and was concerned for possible stroke. Pt A&Ox4 at baseline. Hx of a-fib. EMS reports no slurred speech or facial drop.Searles unknown.      HPI Dawn Morrow is a 84 y.o. female  here for AMS as above.  Remainder of history, ROS, and physical exam limited due to patient's condition (AMS). Additional information was obtained from EMS. Attempted to call family x2 w/o answer.   Level V Caveat  HPI  Past Medical History Past Medical History:  Diagnosis Date  . Anxiety and depression   . Asthma   . Cataract   . COPD (chronic obstructive pulmonary disease) ([place])   . Diabetes mellitus   . Diverticulosis   . DVT (deep vein thrombosis) in pregnancy    hx x multiple on coumadin  . GERD (gastroesophageal reflux disease)   . Hemorrhoids   . Hiatal hernia   . Hyperlipidemia   . Hypertension   . Hypothyroidism   . On home oxygen therapy    "2L; 24/7" (09/17/2017)  . Osteoarthritis    h/o spinal stenosis by MRI 2009  . Osteopenia   . Pulmonary embolism (HCC)    x 2  . Stroke [facility])    MINI  . Tubular adenoma of colon 07/2005   Patient Active Problem List   Diagnosis Date Noted  . Malnutrition of moderate degree 07/19/2020  . Adjustment disorder with depressed mood 07/13/2020  . Dehydration 07/12/2020  . Fall 07/11/2020  . Acute metabolic encephalopathy 41/93/7902  . AMS (altered mental status) 03/26/2020  . History of pulmonary embolism 03/26/2020  . History of DVT (deep vein thrombosis) 03/26/2020  . Atrial fibrillation, chronic ([place]) 03/26/2020  . Sepsis ([place])  12/14/2019  . Acute encephalopathy 12/14/2019  . COVID-19 virus infection 10/02/2019  . Acute lower UTI 10/02/2019  . Acute kidney failure ([place]) 10/01/2019  . Syncope 09/18/2017  . Near syncope 09/17/2017  . Diabetes mellitus type 2 in obese ([place]) 09/17/2017  . Atrial fibrillation with RVR ([place]) 09/17/2017  . Hypomagnesemia 09/17/2017  . CKD (chronic kidney disease), stage III ([place]) 09/17/2017  . Forehead contusion   . MGUS (monoclonal gammopathy of unknown significance) 08/13/2016  . Renal failure 07/09/2015  . Follow-up ---------PCP NOTES 05/17/2015  . Essential tremor 12/28/2014  . Ulnar neuropathy of left upper extremity 12/28/2014  . Intractable pain 06/21/2014  . Back pain 06/21/2014  . Lumbar radiculopathy 06/21/2014  . Encounter for therapeutic drug monitoring 03/14/2014  . Numbness 10/18/2013  . TIA (transient ischemic attack) 09/19/2013  . Weakness 09/18/2013  . Annual physical exam 05/30/2012  . Tremor 01/21/2012  . Failure to thrive and poor med compliance  01/21/2012  . Pulmonary embolism ([place]) 11/18/2010  . DVT (deep venous thrombosis) ([place]) 11/18/2010  . Long term current use of anticoagulant 11/18/2010  . ABNORMAL ELECTROCARDIOGRAM 11/10/2010  . OSTEOARTHRITIS 08/06/2010  . DIZZINESS 04/15/2010  . Diabetes ([place]) 06/04/2009  . ACNE ROSACEA 11/28/2008  . BACK PAIN 05/16/2008  . HIP PAIN, RIGHT, CHRONIC 09/15/2007  . Hypothyroidism 09/13/2007  . Dyslipidemia 09/13/2007  . Osteoporosis 07/22/2007  . DEPRESSION 09/11/2006  .  HTN (hypertension) 09/11/2006  . ASTHMA 09/11/2006  . COPD (chronic obstructive pulmonary disease) ([place]) 09/11/2006  . GERD 09/11/2006  . Recurrent UTI 09/11/2006  . GREENFIELD FILTER INSERTION, HX OF 09/11/2006   Home Medication(s) Prior to Admission medications   Medication Sig Start Date End Date Taking? Authorizing Provider  acetaminophen (TYLENOL) 500 MG tablet Take 500 mg by mouth every 4 (four) hours as needed (pain).    Yes  [provider]  albuterol (PROVENTIL HFA;VENTOLIN HFA) 108 (90 BASE) MCG/ACT inhaler Inhale 2 puffs into the lungs every 6 (six) hours as needed for wheezing or shortness of breath. 08/05/15  Yes Saguier, Percell Miller, PA-C  atorvastatin (LIPITOR) 10 MG tablet Take 1 tablet (10 mg total) by mouth at bedtime. 10/21/15  Yes Paz, Alda Berthold, MD  buPROPion (WELLBUTRIN XL) 300 MG 24 hr tablet Take 1 tablet (300 mg total) by mouth daily. 07/20/20  Yes [name], MD  carvedilol (COREG) 12.5 MG tablet Take 1 tablet (12.5 mg total) by mouth 2 (two) times daily with a meal. 05/06/20  Yes Dessa Phi, DO  Cholecalciferol (VITAMIN D) 50 MCG (2000 UT) tablet Take 2,000 Units by mouth daily.   Yes [provider]  docusate sodium (COLACE) 100 MG capsule Take 100 mg by mouth daily.   Yes [provider]  ezetimibe (ZETIA) 10 MG tablet Take 10 mg by mouth daily.   Yes [provider]  gabapentin (NEURONTIN) 300 MG capsule Take 1 cap in AM, 1 cap at noon, 2 caps at bedtime Patient taking differently: Take 300-600 mg by mouth at bedtime. Take 1 capsule (300 mg) BID and Take 2 capsules (600 mg) at bedtime 12/21/14  Yes Cameron Sprang, MD  insulin aspart (NOVOLOG FLEXPEN) 100 UNIT/ML FlexPen Inject 0-12 Units into the skin See admin instructions. Inject 0-12 units subcutaneously four times daily - before meals and at bedtime - per sliding scale: CBG 0-150 0 units, 151-200 4 units, 201-250 6 units, 251-300 8 units, 301-350 10 units, 351-400 12 units, >400 12 units   Yes [provider]  Insulin Glargine (BASAGLAR KWIKPEN) 100 UNIT/ML Inject 5 Units into the skin at bedtime. 07/19/20  Yes [name], MD  ketoconazole (NIZORAL) 2 % shampoo Apply 1 application topically See admin instructions. Use topically to shampoo hair twice weekly - Tuesday and Friday   Yes [provider]  levothyroxine (SYNTHROID) 88 MCG tablet Take 88 mcg by mouth daily before breakfast.   Yes  [provider]  LORazepam (ATIVAN) 0.5 MG tablet Take 1 tablet (0.5 mg total) by mouth every 8 (eight) hours as needed for anxiety. 07/19/20  Yes [name], MD  meclizine (ANTIVERT) 25 MG tablet Take 25 mg by mouth at bedtime as needed for dizziness.   Yes [provider]  Multiple Vitamin (MULTIVITAMIN WITH MINERALS) TABS tablet Take 1 tablet by mouth daily. 07/20/20  Yes [name], MD  nystatin (NYSTATIN) powder Apply 1 application topically 2 (two) times daily.   Yes [provider]  ondansetron (ZOFRAN) 4 MG tablet Take 4 mg by mouth every 8 (eight) hours as needed for nausea or vomiting.   Yes [provider]  OXYGEN Inhale 3 L into the lungs continuous.   Yes [provider]  polyethylene glycol (MIRALAX / GLYCOLAX) 17 g packet Take 17 g by mouth daily as needed for mild constipation or moderate constipation.   Yes [provider]  Propylene Glycol (SYSTANE BALANCE) 0.6 % SOLN Place 1 drop into  both eyes 4 (four) times daily as needed (for dry eyes).    Yes [provider]  rivaroxaban (XARELTO) 20 MG TABS tablet Take 1 tablet (20 mg total) by mouth daily with supper. Patient taking differently: Take 20 mg by mouth every evening.  09/14/14  Yes Paz, Alda Berthold, MD  sertraline (ZOLOFT) 100 MG tablet Take 1 tablet (100 mg total) by mouth daily. 07/20/20  Yes [name], MD  clindamycin (CLEOCIN) 300 MG capsule Take 1 capsule (300 mg total) by mouth 3 (three) times daily. Patient not taking: Reported on 07/02/2017 06/17/17 07/02/17  Colon Branch, MD                                                                                                                                    Past Surgical History Past Surgical History:  Procedure Laterality Date  . APPENDECTOMY    . CATARACT EXTRACTION    . CHOLECYSTECTOMY    . LUMBAR FUSION    . POLYPECTOMY    . TONSILLECTOMY    . TUBAL LIGATION     Family History Family  History  Adopted: Yes  Problem Relation Age of Onset  . Cancer Mother        ? colon or ovarian  . Diabetes Mother   . Cancer Brother        ?  . CAD Neg Hx     Social History Social History   Tobacco Use  . Smoking status: Former Smoker    Types: Cigarettes  . Smokeless tobacco: Never Used  Vaping Use  . Vaping Use: Never used  Substance Use Topics  . Alcohol use: No    Comment: socially  . Drug use: No   Allergies Penicillins, Codeine, Sulfonylureas, and Sulfonamide derivatives  Review of Systems Review of Systems  Unable to perform ROS: Mental status change    Physical Exam Vital Signs  I have reviewed the triage vital signs BP 110/67   Pulse 84   Temp 99.6 F (37.6 C) (Rectal)   Resp 19   SpO2 100%   Physical Exam Vitals reviewed.  Constitutional:      General: She is not in acute distress.    Appearance: She is well-developed. She is not diaphoretic.  HENT:     Head: Normocephalic and atraumatic.     Nose: Nose normal.     Mouth/Throat:     Mouth: Mucous membranes are dry.  Eyes:     General: No scleral icterus.       Right eye: No discharge.        Left eye: No discharge.     Conjunctiva/sclera: Conjunctivae normal.     Pupils: Pupils are equal, round, and reactive to light.  Cardiovascular:     Rate and Rhythm: Tachycardia present. Rhythm irregularly irregular.     Heart sounds: No murmur heard.  No friction rub. No gallop.  Pulmonary:     Effort: Pulmonary effort is normal. No respiratory distress.     Breath sounds: Normal breath sounds. No stridor. No rales.  Abdominal:     General: There is no distension.     Palpations: Abdomen is soft.     Tenderness: There is no abdominal tenderness.  Musculoskeletal:        General: No tenderness.     Cervical back: Normal range of motion and neck supple.  Skin:    General: Skin is warm and dry.     Findings: No erythema or rash.  Neurological:     Mental Status: She is alert.     Motor:  Tremor present.     Comments: Intermittently answers yes/no questions and follows some commands.  ED Results and Treatments Labs (all labs ordered are listed, but only abnormal results are displayed) Labs Reviewed  COMPREHENSIVE METABOLIC PANEL - Abnormal; Notable for the following components:      Result Value   Potassium 5.2 (*)    Glucose, Bld 151 (*)    BUN 34 (*)    Creatinine, Ser 1.83 (*)    Albumin 2.9 (*)    GFR, Estimated 27 (*)    All other components within normal limits  CBC WITH DIFFERENTIAL/PLATELET - Abnormal; Notable for the following components:   WBC 24.2 (*)    RDW 16.6 (*)    Neutro Abs 21.1 (*)    Abs Immature Granulocytes 0.17 (*)    All other components within normal limits  URINALYSIS, COMPLETE (UACMP) WITH MICROSCOPIC - Abnormal; Notable for the following components:   Color, Urine AMBER (*)    APPearance CLOUDY (*)    Hgb urine dipstick MODERATE (*)    Bilirubin Urine SMALL (*)    Protein, ur 30 (*)    Leukocytes,Ua LARGE (*)    WBC, UA >50 (*)    Bacteria, UA MANY (*)    Non Squamous Epithelial 0-5 (*)    All other components within normal limits  CBG MONITORING, ED - Abnormal; Notable for the following components:   Glucose-Capillary 121 (*)    All other components within normal limits  I-STAT CHEM 8, ED - Abnormal; Notable for the following components:   BUN 37 (*)    Creatinine, Ser 1.80 (*)    Glucose, Bld 142 (*)    Calcium, Ion 1.10 (*)    All other components within normal limits  RESPIRATORY PANEL BY RT PCR (FLU A&B, COVID)  CULTURE, BLOOD (ROUTINE X 2)  CULTURE, BLOOD (ROUTINE X 2)  URINE CULTURE  AMMONIA  LACTIC ACID, PLASMA  TSH  T4, FREE  I-STAT VENOUS BLOOD GAS, ED  I-STAT BETA HCG BLOOD, ED ([facility], [facility], [facility] ONLY)                                                                                                                         EKG  EKG Interpretation  Date/Time:  Tuesday July 30 2020  22:51:56 EST Ventricular Rate:   111 PR Interval:    QRS Duration: 92 QT Interval:  344 QTC Calculation: 497 R Axis:   -43 Text Interpretation: Atrial fibrillation Ventricular premature complex Left axis deviation Borderline low voltage, extremity leads Consider anterior infarct Nonspecific repol abnormality, lateral leads No significant change since 04/27/2020 Reconfirmed by Addison Lank [phone]) on 07/30/2020 10:59:54 PM      Radiology CT HEAD WO CONTRAST  Result Date: 07/31/2020 CLINICAL DATA:  Mental status change of unknown cause. History of hypertension and diabetes. EXAM: CT HEAD WITHOUT CONTRAST TECHNIQUE: Contiguous axial images were obtained from the base of the skull through the vertex without intravenous contrast. COMPARISON:  07/16/2020 FINDINGS: Brain: Mild cerebral atrophy. Mild ventricular dilatation consistent with central atrophy. Prominent low-attenuation changes throughout the deep white matter consistent with small vessel ischemia. No mass-effect or midline shift. No abnormal extra-axial fluid collections. The gray-white matter junctions are distinct. Basal cisterns are not effaced. No acute intracranial hemorrhage. Vascular: Moderate intracranial arterial vascular calcifications. Skull: The calvarium appears intact. No acute depressed skull fractures. Sinuses/Orbits: Paranasal sinuses and mastoid air cells are clear. Other: Dense subcutaneous soft tissue nodule over the left parietal convexity likely representing a pilonidal cyst. IMPRESSION: 1. No acute intracranial abnormalities. 2. Chronic atrophy and small vessel ischemia. Electronically Signed   By: Lucienne Capers M.D.   On: 07/31/2020 00:45   DG Chest Port 1 View  Result Date: 07/30/2020 CLINICAL DATA:  Less responsive EXAM: PORTABLE CHEST 1 VIEW COMPARISON:  07/09/2020 FINDINGS: Mild cardiomegaly with central vascular congestion. Small left-sided pleural effusion. Left basilar consolidation. Aortic atherosclerosis. No pneumothorax. IMPRESSION: Mild  cardiomegaly with central vascular congestion and small left pleural effusion. Left basilar consolidation. Electronically Signed   By: Donavan Foil M.D.   On: 07/30/2020 23:49    Pertinent labs & imaging results that were available during my care of the patient were reviewed by me and considered in my medical decision making (see chart for details).  Medications Ordered in ED Medications  sodium chloride 0.9 % bolus 1,000 mL (0 mLs Intravenous Stopped 07/31/20 0110)    And  0.9 %  sodium chloride infusion ( Intravenous New Bag/Given 07/31/20 0141)  levofloxacin (LEVAQUIN) IVPB 750 mg (750 mg Intravenous New Bag/Given 07/31/20 0142)                                                                                                                                    Procedures .1-3 Lead EKG Interpretation Performed by: Fatima Blank, MD Authorized by: Fatima Blank, MD     Interpretation: abnormal     ECG rate:  124   ECG rate assessment: tachycardic     Rhythm: atrial fibrillation     Ectopy: none     Conduction: normal   .Critical Care Performed by: Fatima Blank, MD Authorized by: Fatima Blank, MD   Critical care provider statement:  Critical care time (minutes):  55   Critical care start time:  07/30/2020 11:06 PM   Critical care end time:  07/31/2020 2:02 AM   Critical care was necessary to treat or prevent imminent or life-threatening deterioration of the following conditions:  Sepsis   Critical care was time spent personally by me on the following activities:  Discussions with consultants, evaluation of patient's response to treatment, examination of patient, ordering and performing treatments and interventions, ordering and review of laboratory studies, ordering and review of radiographic studies, pulse oximetry, re-evaluation of patient's condition, obtaining history from patient or surrogate and review of old charts    (including  critical care time)  Medical Decision Making / ED Course I have reviewed the nursing notes for this encounter and the patient's prior records (if available in EHR or on provided paperwork).   Dawn Morrow was evaluated in Emergency Department on 07/31/2020 for the symptoms described in the history of present illness. She was evaluated in the context of the global COVID-19 pandemic, which necessitated consideration that the patient might be at risk for infection with the SARS-CoV-2 virus that causes COVID-19. Institutional protocols and algorithms that pertain to the evaluation of patients at risk for COVID-19 are in a state of rapid change based on information released by regulatory bodies including the CDC and federal and state organizations. These policies and algorithms were followed during the patient's care in the ED.    Clinical Course as of Jul 31 202  Tue Jul 30, 2020  2346 AMS - unknown etiology.  On record review, patient has had several admission for similar presentations related to urosepsis.  Will obtain infectious work up. Will also get CT head. Thyroid panel to rule out myxedema. VBG for CO2 level. Additional labs to assess for other metabolic processes.   [PC]  Wed Jul 31, 2020  0048 CBC with leukocytosis. Delay in getting UA. Not febrile, and LA wnl, but will go ahead and start empiric Abx to cover for likely UTI (given her prior h/o).   Rest of labs not concerning for myxedema. Ammonia wnl. No significant electrolyte derangements as cause of AMS. CT head unchanged.  Will activate CODE sepsis.   [PC]  0159 Source confirmed - UTI. Already covered. Will send for culture.  Will admit to medicine for further management.   [PC]    Clinical Course User Index [PC] Adilenne Ashworth, Grayce Sessions, MD     Final Clinical Impression(s) / ED Diagnoses Final diagnoses:  Altered mental status  Sepsis with encephalopathy without septic shock, due to unspecified organism [facility])  Acute  cystitis with hematuria      This chart was dictated using voice recognition software.  Despite best efforts to proofread,  errors can occur which can change the documentation meaning.   Fatima Blank, MD 07/31/20 [phone]

## 2020-07-31 NOTE — TOC Initial Note (Signed)
Transition of Care [facility]) - Initial/Assessment Note    Patient Details  Name: Dawn Morrow MRN: [id number] Date of Birth: [birth year]  [facility]) CM/SW Contact:    Vinie Sill, [place] Phone Number: 07/31/2020, 5:29 PM  Clinical Narrative:                  CSW spoke with patient's nephew,Dawn Morrow. CSW introduced self and explained role.  He confirmed patient is from  [facility] and expects her to return to SNF once she is medically stable. CSW advised [facility] has requested family complete admission paperwork before patient can return. Joe states he is out of town and is expected to return on Monday. He inquired if CSW can ask his sister, Dawn Morrow, that is currently a patient her at the hospital, if she can complete admission paperwork. CSW advised he needs to contact family and determine who will be completing admission paperwork for patient. Joe states he will call CSW back by Friday.  CSW will continue to follow and assist with discharge information.  Thurmond Butts, MSW, [place] Clinical Social Worker   Expected Discharge Plan: Skilled Nursing Facility Barriers to Discharge: Continued Medical Work up, Insurance Authorization   Patient Goals and CMS Choice        Expected Discharge Plan and Services Expected Discharge Plan: [place] In-house Referral: Clinical Social Work                                            Prior Living Arrangements/Services                       Activities of Daily Living      Permission Sought/Granted      Share Information with NAME: [place] granted to share info w Relationship: nephew & niece  Permission granted to share info w Contact Information: [phone] or [phone]  Emotional Assessment   Attitude/Demeanor/Rapport: Unable to Assess Affect (typically observed): Unable to Assess Orientation: : Oriented to Self Alcohol / Substance  Use: Not Applicable Psych Involvement: No (comment)  Admission diagnosis:  UTI (urinary tract infection) [N39.0] Altered mental status [R41.82] Acute cystitis with hematuria [N30.01] Sepsis with encephalopathy without septic shock, due to unspecified organism ([place]) [A41.9, R65.20, G93.40] Patient Active Problem List   Diagnosis Date Noted  . UTI (urinary tract infection) 07/31/2020  . CAP (community acquired pneumonia) 07/31/2020  . Pressure injury of skin 07/31/2020  . Malnutrition of moderate degree 07/19/2020  . Adjustment disorder with depressed mood 07/13/2020  . Dehydration 07/12/2020  . Fall 07/11/2020  . Acute metabolic encephalopathy 07/10/1593  . AMS (altered mental status) 03/26/2020  . History of pulmonary embolism 03/26/2020  . History of DVT (deep vein thrombosis) 03/26/2020  . Atrial fibrillation, chronic ([place]) 03/26/2020  . Sepsis ([place]) 12/14/2019  . Acute encephalopathy 12/14/2019  . COVID-19 virus infection 10/02/2019  . Acute lower UTI 10/02/2019  . AKI (acute kidney injury) ([place]) 10/01/2019  . Syncope 09/18/2017  . Near syncope 09/17/2017  . Diabetes mellitus type 2 in obese ([place]) 09/17/2017  . Atrial fibrillation with RVR ([place]) 09/17/2017  . Hypomagnesemia 09/17/2017  . CKD (chronic kidney disease), stage III ([place]) 09/17/2017  . Forehead contusion   . MGUS (monoclonal gammopathy of unknown significance) 08/13/2016  . Renal failure 07/09/2015  . Follow-up ---------  PCP NOTES 05/17/2015  . Essential tremor 12/28/2014  . Ulnar neuropathy of left upper extremity 12/28/2014  . Intractable pain 06/21/2014  . Back pain 06/21/2014  . Lumbar radiculopathy 06/21/2014  . Encounter for therapeutic drug monitoring 03/14/2014  . Numbness 10/18/2013  . TIA (transient ischemic attack) 09/19/2013  . Weakness 09/18/2013  . Annual physical exam 05/30/2012  . Tremor 01/21/2012  . Failure to thrive and poor med compliance  01/21/2012  . Pulmonary embolism ([place])  11/18/2010  . DVT (deep venous thrombosis) ([place]) 11/18/2010  . Long term current use of anticoagulant 11/18/2010  . ABNORMAL ELECTROCARDIOGRAM 11/10/2010  . OSTEOARTHRITIS 08/06/2010  . DIZZINESS 04/15/2010  . Diabetes ([place]) 06/04/2009  . ACNE ROSACEA 11/28/2008  . BACK PAIN 05/16/2008  . HIP PAIN, RIGHT, CHRONIC 09/15/2007  . Hypothyroidism 09/13/2007  . Dyslipidemia 09/13/2007  . Osteoporosis 07/22/2007  . DEPRESSION 09/11/2006  . HTN (hypertension) 09/11/2006  . ASTHMA 09/11/2006  . COPD (chronic obstructive pulmonary disease) ([place]) 09/11/2006  . GERD 09/11/2006  . Recurrent UTI 09/11/2006  . [place] FILTER INSERTION, HX OF 09/11/2006   PCP:  Wenda Low, MD Pharmacy:   [facility], [address] [place] [address] Phone: [phone] Fax: [phone]     Social Determinants of Health (SDOH) Interventions    Readmission Risk Interventions Readmission Risk Prevention Plan 10/03/2019  Transportation Screening Complete  PCP or Specialist Appt within 3-5 Days Complete  HRI or [place] Not Complete  HRI or Home Care Consult comments Patient at her baseline  Social Work Consult for [place] Planning/Counseling [place] Not Applicable  Medication Review Press photographer) Referral to Pharmacy  Some recent data might be hidden

## 2020-07-31 NOTE — Progress Notes (Signed)
Pharmacy Antibiotic Note  CASSIA FEIN is a 84 y.o. female admitted on 07/30/2020 with CAP vs UTI.  Pharmacy has been consulted for Levaquin dosing. WBC is elevated. Noted renal dysfunction. Levaquin 750 mg IV x 1 given in ED.   Plan: Levaquin 500 mg IV q48h Trend WBC, temp, renal function  F/U infectious work-up  Temp (24hrs), Avg:99.2 F (37.3 C), Min:98.8 F (37.1 C), Max:99.6 F (37.6 C)  Recent Labs  Lab 07/30/20 2334 07/30/20 2344 07/31/20 0023  WBC  --  24.2*  --   CREATININE  --  1.83* 1.80*  LATICACIDVEN 1.6  --   --     CrCl cannot be calculated (Unknown ideal weight.).    Allergies  Allergen Reactions  . Penicillins Anaphylaxis  . Codeine Other (See Comments)    Listed on MAR  . Sulfonylureas Other (See Comments)    Listed on MAR  . Sulfonamide Derivatives Other (See Comments)    Unknown reaction    Narda Bonds, PharmD, [place] Clinical Pharmacist Phone: [phone]

## 2020-07-31 NOTE — Evaluation (Signed)
Physical Therapy Evaluation & Discharge Patient Details Name: Dawn Morrow MRN: [id number] DOB: 11/19/1935 Today's Date: 07/31/2020   History of Present Illness  Pt is an 84 y.o. female from SNF on 07/30/20 with AMS. Workup for sepsis due to UTI and possible PNA causing toxic encephalopathy. PMH includes dementia, COPD (3L O2 baseline), asthma, DVT/PE, afib (on Xarelto), HTN, stroke, CKD 3, recurrent UTIs. Per family, pt primarily bedbound at SNF. Of note, recent admission 07/11/20 from [facility] after fall out of w/c with d/c to [facility] for ST rehab.    Clinical Impression  Patient evaluated by Physical Therapy with no further acute PT needs identified. PTA, pt receiving rehab at SNF since recent admission 07/11/20; prior to this, pt ALF resident. Today, pt required maxA for bed mobility; pt with multiple bouts of bowel incontinence during session (RN aware) and dependent for pericare/washup. Pt pleasantly confused throughout session, following some simple commands with increased time. Recommend return to SNF. Acute PT is signing off. Thank you for this referral.    Follow Up Recommendations SNF;Supervision for mobility/OOB    Equipment Recommendations  None recommended by PT    Recommendations for Other Services       Precautions / Restrictions Precautions Precautions: Fall;Other (comment) Precaution Comments: Bladder/bowel incontinence with h/o recurrent UTIs Restrictions Weight Bearing Restrictions: No      Mobility  Bed Mobility Overal bed mobility: Needs Assistance Bed Mobility: Rolling;Sidelying to Sit;Sit to Sidelying Rolling: Mod assist Sidelying to sit: Max assist;HOB elevated     Sit to sidelying: Max assist General bed mobility comments: Frequent cues/gestures for sequencing and to use bed rails; multiple rolls R/L for pericare/washup/linen change due to bowel incontinence, pt able to maintain sidelying position well with bed rail; maxA to elevate trunk to  sit EOB; maxA for LE management return to supine    Transfers                 General transfer comment: Deferred secondary to multiple bouts of bowel incontinence sitting EOB. Will likely require +2 assist  Ambulation/Gait                Stairs            Wheelchair Mobility    Modified Rankin (Stroke Patients Only)       Balance Overall balance assessment: History of Falls;Needs assistance Sitting-balance support: Bilateral upper extremity supported;Feet supported Sitting balance-Leahy Scale: Poor Sitting balance - Comments: Can static sit EOB but reliant on UE support Postural control: Left lateral lean                                   Pertinent Vitals/Pain Pain Assessment: Faces Faces Pain Scale: Hurts even more Pain Location: Sacral/perineal area with pericare due to bowel incontinence Pain Descriptors / Indicators: Moaning;Grimacing;Guarding Pain Intervention(s): Monitored during session;Limited activity within patient's tolerance    Home Living Family/patient expects to be discharged to:: Skilled nursing facility                 Additional Comments: Per chart, initially from [facility] with decreased ambulation and increased fall, assist with w/c; was recently admitted 07/2020 with d/c to [facility] for short-term rehab    Prior Function Level of Independence: Needs assistance   Gait / Transfers Assistance Needed: Nephew reports pt primarily bedbound the past few months  ADL's / Homemaking Assistance Needed: Unreliable historian - likely  requires asssitance with ADLs secondary to dementia/cognitive deficits.        Hand Dominance        Extremity/Trunk Assessment   Upper Extremity Assessment Upper Extremity Assessment: Generalized weakness;Difficult to assess due to impaired cognition    Lower Extremity Assessment Lower Extremity Assessment: Generalized weakness;Difficult to assess due to impaired  cognition       Communication   Communication: HOH  Cognition Arousal/Alertness: Awake/alert Behavior During Therapy: WFL for tasks assessed/performed Overall Cognitive Status: History of cognitive impairments - at baseline Area of Impairment: Orientation;Attention;Memory;Following commands;Safety/judgement;Awareness;Problem solving                 Orientation Level: Disoriented to;Place;Time;Situation Current Attention Level: Focused;Sustained Memory: Decreased short-term memory Following Commands: Follows one step commands inconsistently;Follows one step commands with increased time Safety/Judgement: Decreased awareness of safety;Decreased awareness of deficits Awareness: Intellectual Problem Solving: Requires verbal cues;Requires tactile cues General Comments: H/o dementia with apparent cognitive impairment likely exacerbated by [facility]. Able to state "hospital" but unsure which one. Pleasantly confused, speaking to people not in room requiring reorientation. Followed majority of one-step verbal and gestural commands with increased time.      General Comments General comments (skin integrity, edema, etc.): RN aware of incontinence and that pt will need new sacral pad    Exercises     Assessment/Plan    PT Assessment All further PT needs can be met in the next venue of care  PT Problem List Decreased strength;Decreased activity tolerance;Decreased balance;Decreased knowledge of use of DME;Pain;Decreased mobility;Decreased safety awareness;Decreased cognition;Decreased skin integrity       PT Treatment Interventions      PT Goals (Current goals can be found in the Care Plan section)  Acute Rehab PT Goals PT Goal Formulation: All assessment and education complete, DC therapy    Frequency     Barriers to discharge        Co-evaluation               AM-PAC PT "6 Clicks" Mobility  Outcome Measure Help needed turning from your back to your side while in a flat  bed without using bedrails?: A Lot Help needed moving from lying on your back to sitting on the side of a flat bed without using bedrails?: A Lot Help needed moving to and from a bed to a chair (including a wheelchair)?: Total Help needed standing up from a chair using your arms (e.g., wheelchair or bedside chair)?: Total Help needed to walk in hospital room?: Total Help needed climbing 3-5 steps with a railing? : Total 6 Click Score: 8    End of Session   Activity Tolerance: Treatment limited secondary to medical complications (Comment) (confusion, incontinence) Patient left: in bed;with call bell/phone within reach;with bed alarm set Nurse Communication: Mobility status PT Visit Diagnosis: Muscle weakness (generalized) (M62.81);Other abnormalities of gait and mobility (R26.89)    Time: 1610-9604 PT Time Calculation (min) (ACUTE ONLY): 28 min   Charges:   PT Evaluation $PT Eval Moderate Complexity: 1 Mod PT Treatments $Therapeutic Activity: 8-22 mins       Mabeline Caras, PT, DPT Acute Rehabilitation Services  Pager [phone] Office [place] 07/31/2020, 12:38 PM

## 2020-07-31 NOTE — Evaluation (Signed)
Clinical/Bedside Swallow Evaluation Patient Details  Name: Dawn Morrow MRN: [id number] Date of Birth: 1936/04/01  Today's Date: 07/31/2020 Time: SLP Start Time (ACUTE ONLY): 1248 SLP Stop Time (ACUTE ONLY): 1257 SLP Time Calculation (min) (ACUTE ONLY): 9 min  Past Medical History:  Past Medical History:  Diagnosis Date  . Anxiety and depression   . Asthma   . Cataract   . COPD (chronic obstructive pulmonary disease) ([place])   . Diabetes mellitus   . Diverticulosis   . DVT (deep vein thrombosis) in pregnancy    hx x multiple on coumadin  . GERD (gastroesophageal reflux disease)   . Hemorrhoids   . Hiatal hernia   . Hyperlipidemia   . Hypertension   . Hypothyroidism   . On home oxygen therapy    "2L; 24/7" (09/17/2017)  . Osteoarthritis    h/o spinal stenosis by MRI 2009  . Osteopenia   . Pulmonary embolism (HCC)    x 2  . Stroke [facility])    MINI  . Tubular adenoma of colon 07/2005   Past Surgical History:  Past Surgical History:  Procedure Laterality Date  . APPENDECTOMY    . CATARACT EXTRACTION    . CHOLECYSTECTOMY    . LUMBAR FUSION    . POLYPECTOMY    . TONSILLECTOMY    . TUBAL LIGATION     HPI:  Dawn Morrow is a 84 y.o. female with medical history significant of dementia, frequent falls, anxiety and depression, asthma, COPD, chronic hypoxic and hypercapnic respiratory failure on 3 L home oxygen, diabetes, history of DVT and PE, persistent A. fib on Xarelto, stroke, hypertension, hyperlipidemia, hypothyroidism, CKD stage IIIb presenting the ED via EMS from her nursing home for evaluation of altered status, she has been bedbound for about 2 months and has had recurrent UTIs.  In the ER was found to have UTI and admitted to the hospital.   Assessment / Plan / Recommendation Clinical Impression  Pt presents with normal oropharyngeal swallow with adequate oral attention and mastication, the appearance of a brisk swallow response, no oral residue post swallow, no s/s of  aspiration.  Recommend continuing current diet with thin liquids; meds whole with water.  No dysphagia.  SLP will s/o.   SLP Visit Diagnosis: Dysphagia, unspecified (R13.10)    Aspiration Risk  No limitations    Diet Recommendation   soft diet, thin liquids  Medication Administration: Whole meds with liquid    Other  Recommendations Oral Care Recommendations: Oral care BID   Follow up Recommendations None      Frequency and Duration   n/a         Prognosis        Swallow Study   General Date of Onset: 07/31/20 HPI: Dawn Morrow is a 84 y.o. female with medical history significant of dementia, frequent falls, anxiety and depression, asthma, COPD, chronic hypoxic and hypercapnic respiratory failure on 3 L home oxygen, diabetes, history of DVT and PE, persistent A. fib on Xarelto, stroke, hypertension, hyperlipidemia, hypothyroidism, CKD stage IIIb presenting the ED via EMS from her nursing home for evaluation of altered status, she has been bedbound for about 2 months and has had recurrent UTIs.  In the ER was found to have UTI and admitted to the hospital. Type of Study: Bedside Swallow Evaluation Previous Swallow Assessment: no Diet Prior to this Study: Thin liquids;Regular Temperature Spikes Noted: No Respiratory Status: Nasal cannula History of Recent Intubation: No Behavior/Cognition: Alert;Cooperative;Confused Oral Cavity Assessment:  Within Functional Limits Oral Care Completed by SLP: No Oral Cavity - Dentition: Adequate natural dentition Vision: Functional for self-feeding Self-Feeding Abilities: Able to feed self Patient Positioning: Upright in bed Baseline Vocal Quality: Normal Volitional Cough: Strong Volitional Swallow: Able to elicit    Oral/Motor/Sensory Function Overall Oral Motor/Sensory Function: Within functional limits   Ice Chips Ice chips: Within functional limits   Thin Liquid Thin Liquid: Within functional limits    Nectar Thick Nectar Thick  Liquid: Not tested   Honey Thick Honey Thick Liquid: Not tested   Puree Puree: Within functional limits   Solid     Solid: Within functional limits      Juan Quam Laurice 07/31/2020,12:58 PM   Estill Bamberg L. Tivis Ringer, [place] Office number [phone] Pager [phone]

## 2020-07-31 NOTE — Sepsis Progress Note (Signed)
Sepsis Monitoring Complete

## 2020-07-31 NOTE — Plan of Care (Signed)

## 2020-07-31 NOTE — TOC Progression Note (Signed)
Transition of Care [facility]) - Progression Note    Patient Details  Name: Dawn Morrow MRN: [id number] Date of Birth: [birth year]  Transition of Care [facility]) CM/SW [place], [place] Phone Number: 07/31/2020, 5:20 PM  Clinical Narrative:            Expected Discharge Plan and Services                                                 Social Determinants of Health (SDOH) Interventions    Readmission Risk Interventions Readmission Risk Prevention Plan 10/03/2019  Transportation Screening Complete  PCP or Specialist Appt within 3-5 Days Complete  HRI or [place] Not Complete  HRI or Home Care Consult comments Patient at her baseline  Social Work Consult for [place] Planning/Counseling [place] Not Applicable  Medication Review (RN Care Manager) Referral to Pharmacy  Some recent data might be hidden

## 2020-08-01 DIAGNOSIS — G9341 Metabolic encephalopathy: Secondary | ICD-10-CM | POA: Diagnosis not present

## 2020-08-01 DIAGNOSIS — N390 Urinary tract infection, site not specified: Secondary | ICD-10-CM | POA: Diagnosis not present

## 2020-08-01 DIAGNOSIS — J189 Pneumonia, unspecified organism: Secondary | ICD-10-CM | POA: Diagnosis not present

## 2020-08-01 DIAGNOSIS — N179 Acute kidney failure, unspecified: Secondary | ICD-10-CM | POA: Diagnosis not present

## 2020-08-01 LAB — CBC WITH DIFFERENTIAL/PLATELET
Abs Immature Granulocytes: 0.19 10*3/uL — ABNORMAL HIGH (ref 0.00–0.07)
Basophils Absolute: 0.1 10*3/uL (ref 0.0–0.1)
Basophils Relative: 0 %
Eosinophils Absolute: 0.2 10*3/uL (ref 0.0–0.5)
Eosinophils Relative: 1 %
HCT: 36.7 % (ref 36.0–46.0)
Hemoglobin: 11.1 g/dL — ABNORMAL LOW (ref 12.0–15.0)
Immature Granulocytes: 1 %
Lymphocytes Relative: 13 %
Lymphs Abs: 3.3 10*3/uL (ref 0.7–4.0)
MCH: 28.5 pg (ref 26.0–34.0)
MCHC: 30.2 g/dL (ref 30.0–36.0)
MCV: 94.1 fL (ref 80.0–100.0)
Monocytes Absolute: 1.2 10*3/uL — ABNORMAL HIGH (ref 0.1–1.0)
Monocytes Relative: 5 %
Neutro Abs: 20.8 10*3/uL — ABNORMAL HIGH (ref 1.7–7.7)
Neutrophils Relative %: 80 %
Platelets: 237 10*3/uL (ref 150–400)
RBC: 3.9 MIL/uL (ref 3.87–5.11)
RDW: 16.8 % — ABNORMAL HIGH (ref 11.5–15.5)
WBC: 25.7 10*3/uL — ABNORMAL HIGH (ref 4.0–10.5)
nRBC: 0 % (ref 0.0–0.2)

## 2020-08-01 LAB — COMPREHENSIVE METABOLIC PANEL
ALT: 21 U/L (ref 0–44)
AST: 22 U/L (ref 15–41)
Albumin: 2.4 g/dL — ABNORMAL LOW (ref 3.5–5.0)
Alkaline Phosphatase: 75 U/L (ref 38–126)
Anion gap: 13 (ref 5–15)
BUN: 28 mg/dL — ABNORMAL HIGH (ref 8–23)
CO2: 23 mmol/L (ref 22–32)
Calcium: 8.3 mg/dL — ABNORMAL LOW (ref 8.9–10.3)
Chloride: 102 mmol/L (ref 98–111)
Creatinine, Ser: 1.36 mg/dL — ABNORMAL HIGH (ref 0.44–1.00)
GFR, Estimated: 39 mL/min — ABNORMAL LOW (ref >60)
Glucose, Bld: 103 mg/dL — ABNORMAL HIGH (ref 70–99)
Potassium: 3.6 mmol/L (ref 3.5–5.1)
Sodium: 138 mmol/L (ref 135–145)
Total Bilirubin: 0.5 mg/dL (ref 0.3–1.2)
Total Protein: 5.5 g/dL — ABNORMAL LOW (ref 6.5–8.1)

## 2020-08-01 LAB — GLUCOSE, CAPILLARY
Glucose-Capillary: 107 mg/dL — ABNORMAL HIGH (ref 70–99)
Glucose-Capillary: 253 mg/dL — ABNORMAL HIGH (ref 70–99)
Glucose-Capillary: 222 mg/dL — ABNORMAL HIGH (ref 70–99)
Glucose-Capillary: 180 mg/dL — ABNORMAL HIGH (ref 70–99)

## 2020-08-01 LAB — PROCALCITONIN: Procalcitonin: <0.1 ng/mL

## 2020-08-01 LAB — MAGNESIUM: Magnesium: 1.7 mg/dL (ref 1.7–2.4)

## 2020-08-01 LAB — BRAIN NATRIURETIC PEPTIDE: B Natriuretic Peptide: 221.7 pg/mL — ABNORMAL HIGH (ref 0.0–100.0)

## 2020-08-01 MED ORDER — LOPERAMIDE HCL 2 MG PO CAPS
2.0000 mg | ORAL_CAPSULE | ORAL | Status: DC | PRN
  Administered 2020-08-01: 2 mg via ORAL
  Filled 2020-08-01: qty 1

## 2020-08-01 MED ORDER — LEVOFLOXACIN 500 MG PO TABS
500.0000 mg | ORAL_TABLET | ORAL | Status: DC
  Filled 2020-08-01: qty 1

## 2020-08-01 NOTE — Progress Notes (Signed)
PROGRESS NOTE                                                                                                                                                                                                             Patient Demographics:    Dawn Morrow, is a 84 y.o. female, DOB - May 29, 1936, XNA:[id number]  Outpatient Primary MD for the patient is Wenda Low, MD    LOS - 1  Admit date - 07/30/2020    Chief Complaint  Patient presents with   Altered Mental Status       Brief Narrative (HPI from H&P) - Dawn Morrow is a 84 y.o. female with medical history significant of dementia, frequent falls, anxiety and depression, asthma, COPD, chronic hypoxic and hypercapnic respiratory failure on 3 L home oxygen, diabetes, history of DVT and PE, persistent A. fib on Xarelto, stroke, hypertension, hyperlipidemia, hypothyroidism, CKD stage IIIb presenting the ED via EMS from her nursing home for evaluation of altered status, she has been bedbound for about 2 months and has had recurrent UTIs.  In the ER was found to have UTI and admitted to the hospital.   Subjective:   No major issues overnight-remains comfortable-denies any chest pain or shortness of breath.   Assessment  & Plan :   Sepsis due to UTI and possible HCAP/aspiration pneumonia: Although sepsis physiology has improved-continues to have significant amount of leukocytosis.  Urine culture positive for Proteus-awaiting sensitivities.  Blood culture negative so far.  Continue IV Levaquin-await final culture data.  If significant leukocytosis persists-may need imaging to rule out abscesses etc.  Per prior notes-patient is essentially bedbound at SNF-and has had recurrent bouts of UTI recently.  Acute metabolic encephalopathy: Secondary to above-currently pleasantly confused-but able to answer most of my questions appropriately.  CT head on admission was negative.  AKI on CKD  stage IIIb: AKI hemodynamically mediated-creatinine close to baseline.  Hyperkalemia: Resolved  Chronic diastolic heart failure: Euvolemic on exam-follow.  Chronic atrial fibrillation: Rate controlled  HTN: BP controlled-continue Coreg.  HLD: Continue Lipitor/Zetia  DM-2: CBG stable-continue Lantus 5 units daily and SSI.  Follow and adjust.  Recent Labs    07/31/20 2132 08/01/20 0723 08/01/20 1155  GLUCAP 118* 107* 253*    COPD with chronic hypoxic respiratory failure: No signs of exacerbation-continue bronchodilators.  Apparently on 2  L at all times.  Hypothyroidism: Continue Synthroid-TSH stable.  Anxiety/depression/dementia: Seems to be at baseline-continue Zoloft and Wellbutrin.  Condition -  Guarded  Family Communication  : Foy [place] [phone] on 08/01/2020  Code Status :  DNR  Consults  :  None  Procedures  :    CT Head - Non acute  PUD Prophylaxis : None  Disposition Plan  :    Status is: Inpatient  Remains inpatient appropriate because:IV treatments appropriate due to intensity of illness or inability to take PO   Dispo: The patient is from: SNF              Anticipated d/c is to: SNF              Anticipated d/c date is: 3 days              Patient currently is not medically stable to d/c.   DVT Prophylaxis  :  Xarelto  Lab Results  Component Value Date   PLT 237 08/01/2020    Diet :  Diet Order            DIET SOFT Room service appropriate? No; Fluid consistency: Thin  Diet effective now                  Inpatient Medications  Scheduled Meds:  atorvastatin  10 mg Oral QHS   buPROPion  300 mg Oral Daily   carvedilol  12.5 mg Oral BID WC   Chlorhexidine Gluconate Cloth  6 each Topical Q0600   cholecalciferol  2,000 Units Oral Daily   docusate sodium  100 mg Oral Daily   ezetimibe  10 mg Oral Daily   gabapentin  300 mg Oral BID   And   gabapentin  600 mg Oral QHS   insulin aspart  0-9 Units Subcutaneous TID WC     insulin glargine  5 Units Subcutaneous QHS   [START ON 08/02/2020] levofloxacin  500 mg Oral Q48H   levothyroxine  88 mcg Oral QAC breakfast   multivitamin with minerals  1 tablet Oral Daily   mupirocin ointment  1 application Nasal BID   Rivaroxaban  15 mg Oral QPM   sertraline  100 mg Oral Daily   Continuous Infusions:  PRN Meds:.acetaminophen **OR** acetaminophen, albuterol, loperamide, polyethylene glycol  Antibiotics  :    Anti-infectives (From admission, onward)   Start     Dose/Rate Route Frequency Ordered Stop   08/02/20 2200  levofloxacin (LEVAQUIN) tablet 500 mg        500 mg Oral Every 48 hours 08/01/20 0755     08/01/20 2200  levofloxacin (LEVAQUIN) IVPB 500 mg  Status:  Discontinued        500 mg 100 mL/hr over 60 Minutes Intravenous Every 48 hours 07/31/20 0326 08/01/20 0755   07/31/20 0100  levofloxacin (LEVAQUIN) IVPB 750 mg        750 mg 100 mL/hr over 90 Minutes Intravenous  Once 07/31/20 0055 07/31/20 0352       Time Spent in minutes  30   Oren Binet M.D on 08/01/2020 at 2:44 PM  To page go to [email or web address] - password [facility]  [facility] -  Office  [phone]   See all Orders from today for further details    Objective:   Vitals:   07/31/20 2123 08/01/20 0534 08/01/20 0752 08/01/20 1437  BP: 106/61 (!) 98/55 126/69 (!) 105/57  Pulse: 83 81 84 83  Resp: 16 18  20  Temp: 99.2 F (37.3 C) 97.7 F (36.5 C)  97.9 F (36.6 C)  TempSrc: Oral Axillary  Oral  SpO2: 97% 91%  95%  Weight:  85.1 kg    Height:        Wt Readings from Last 3 Encounters:  08/01/20 85.1 kg  07/12/20 84.3 kg  06/19/20 88.9 kg     Intake/Output Summary (Last 24 hours) at 08/01/2020 1444 Last data filed at 08/01/2020 0843 Gross per 24 hour  Intake 623.52 ml  Output --  Net 623.52 ml     Physical Exam Gen Exam: Pleasantly confused-answers some questions appropriately-looks very frail-but not in any distress. HEENT:atraumatic,  normocephalic Chest: B/L clear to auscultation anteriorly CVS:S1S2 regular Abdomen:soft non tender, non distended Extremities:no edema Neurology: Seems to be moving all 4 extremities but appears very frail and chronically weak appearing  skin: no rash    Data Review:    CBC Recent Labs  Lab 07/30/20 2344 07/31/20 0023 07/31/20 0359 08/01/20 0149  WBC 24.2*  --  23.8* 25.7*  HGB 13.0 14.6 11.4* 11.1*  HCT 42.9 43.0 38.4 36.7  PLT 366  --  366 237  MCV 95.3  --  95.3 94.1  MCH 28.9  --  28.3 28.5  MCHC 30.3  --  29.7* 30.2  RDW 16.6*  --  16.7* 16.8*  LYMPHSABS 2.2  --   --  3.3  MONOABS 0.7  --   --  1.2*  EOSABS 0.0  --   --  0.2  BASOSABS 0.1  --   --  0.1    Recent Labs  Lab 07/30/20 2334 07/30/20 2344 07/31/20 0023 07/31/20 0359 07/31/20 0754 08/01/20 0149 08/01/20 0449  NA  --  139 139 139  --  138  --   K  --  5.2* 5.1 5.3*  --  3.6  --   CL  --  99 100 99  --  102  --   CO2  --  29  --  30  --  23  --   GLUCOSE  --  151* 142* 148*  --  103*  --   BUN  --  34* 37* 33*  --  28*  --   CREATININE  --  1.83* 1.80* 1.75*  --  1.36*  --   CALCIUM  --  9.3  --  8.8*  --  8.3*  --   AST  --  32  --   --   --  22  --   ALT  --  35  --   --   --  21  --   ALKPHOS  --  88  --   --   --  75  --   BILITOT  --  0.9  --   --   --  0.5  --   ALBUMIN  --  2.9*  --   --   --  2.4*  --   MG  --   --   --   --   --  1.7  --   PROCALCITON  --   --   --   --  0.11  --  <0.10  LATICACIDVEN 1.6  --   --   --   --   --   --   TSH  --  4.490  --   --   --   --   --   AMMONIA 19  --   --   --   --   --   --  BNP  --   --   --  307.6*  --  221.7*  --     ------------------------------------------------------------------------------------------------------------------ No results for input(s): CHOL, HDL, LDLCALC, TRIG, CHOLHDL, LDLDIRECT in the last 72 hours.  Lab Results  Component Value Date   HGBA1C 5.1 07/11/2020    ------------------------------------------------------------------------------------------------------------------ Recent Labs    07/30/20 2344  TSH 4.490    Cardiac Enzymes No results for input(s): CKMB, TROPONINI, MYOGLOBIN in the last 168 hours.  Invalid input(s): CK ------------------------------------------------------------------------------------------------------------------    Component Value Date/Time   BNP 221.7 (H) 08/01/2020 0149    Micro Results Recent Results (from the past 240 hour(s))  Blood Cultures (routine x 2)     Status: None (Preliminary result)   Collection Time: 07/30/20 11:34 PM   Specimen: BLOOD  Result Value Ref Range Status   Specimen Description BLOOD RIGHT ANTECUBITAL  Final   Special Requests   Final    BOTTLES DRAWN AEROBIC AND ANAEROBIC Blood Culture adequate volume   Culture   Final    NO GROWTH 1 DAY Performed at [facility], [address], [place], [place] [address]    Report Status PENDING  Incomplete  Respiratory Panel by RT PCR (Flu A&B, Covid) - Nasopharyngeal Swab     Status: None   Collection Time: 07/30/20 11:44 PM   Specimen: Nasopharyngeal Swab; Nasopharyngeal(NP) swabs in vial transport medium  Result Value Ref Range Status   SARS Coronavirus 2 by RT PCR NEGATIVE NEGATIVE Final    Comment: (NOTE) SARS-CoV-2 target nucleic acids are NOT DETECTED.  The SARS-CoV-2 RNA is generally detectable in upper respiratoy specimens during the acute phase of infection. The lowest concentration of SARS-CoV-2 viral copies this assay can detect is 131 copies/mL. A negative result does not preclude SARS-Cov-2 infection and should not be used as the sole basis for treatment or other patient management decisions. A negative result may occur with  improper specimen collection/handling, submission of specimen other than nasopharyngeal swab, presence of viral mutation(s) within the areas targeted by this assay, and inadequate number of  viral copies (<131 copies/mL). A negative result must be combined with clinical observations, patient history, and epidemiological information. The expected result is Negative.  Fact Sheet for Patients:  PinkCheek.be  Fact Sheet for Healthcare Providers:  GravelBags.it  This test is no t yet approved or cleared by the [place] FDA and  has been authorized for detection and/or diagnosis of SARS-CoV-2 by FDA under an Emergency Use Authorization (EUA). This EUA will remain  in effect (meaning this test can be used) for the duration of the COVID-19 declaration under Section 564(b)(1) of the Act, 21 U.S.C. section 360bbb-3(b)(1), unless the authorization is terminated or revoked sooner.     Influenza A by PCR NEGATIVE NEGATIVE Final   Influenza B by PCR NEGATIVE NEGATIVE Final    Comment: (NOTE) The Xpert Xpress SARS-CoV-2/FLU/RSV assay is intended as an aid in  the diagnosis of influenza from Nasopharyngeal swab specimens and  should not be used as a sole basis for treatment. Nasal washings and  aspirates are unacceptable for Xpert Xpress SARS-CoV-2/FLU/RSV  testing.  Fact Sheet for Patients: PinkCheek.be  Fact Sheet for Healthcare Providers: GravelBags.it  This test is not yet approved or cleared by the [place] FDA and  has been authorized for detection and/or diagnosis of SARS-CoV-2 by  FDA under an Emergency Use Authorization (EUA). This EUA will remain  in effect (meaning this test can be used) for the duration of  the  Covid-19 declaration under Section 564(b)(1) of the Act, 21  U.S.C. section 360bbb-3(b)(1), unless the authorization is  terminated or revoked. Performed at [facility], [place] [address] [place], [place] [address]   Urine culture     Status: Abnormal (Preliminary result)   Collection Time: 07/31/20  1:12 AM   Specimen: Urine,  Random  Result Value Ref Range Status   Specimen Description URINE, RANDOM  Final   Special Requests NONE  Final   Culture (A)  Final    >=100,000 COLONIES/mL PROTEUS MIRABILIS SUSCEPTIBILITIES TO FOLLOW Performed at [facility], [place] [address] [place], [place] [address]    Report Status PENDING  Incomplete  MRSA PCR Screening     Status: Abnormal   Collection Time: 07/31/20  5:40 AM   Specimen: Nasal Mucosa; Nasopharyngeal  Result Value Ref Range Status   MRSA by PCR POSITIVE (A) NEGATIVE Final    Comment:        The GeneXpert MRSA Assay (FDA approved for NASAL specimens only), is one component of a comprehensive MRSA colonization surveillance program. It is not intended to diagnose MRSA infection nor to guide or monitor treatment for MRSA infections. CRITICAL RESULT CALLED TO, READ BACK BY AND VERIFIED WITH: RN LIANA K. AT 9735 07/31/20 BY MM. Performed at [facility], [place] [address], [place], [place] [address]   Culture, blood (Routine X 2) w Reflex to ID Panel     Status: None (Preliminary result)   Collection Time: 07/31/20 12:29 PM   Specimen: BLOOD RIGHT FOREARM  Result Value Ref Range Status   Specimen Description BLOOD RIGHT FOREARM  Final   Special Requests   Final    BOTTLES DRAWN AEROBIC AND ANAEROBIC Blood Culture adequate volume   Culture   Final    NO GROWTH < 24 HOURS Performed at [facility], [place] [address] [place], [place] [address]    Report Status PENDING  Incomplete    Radiology Reports DG Chest 1 View  Result Date: 07/09/2020 CLINICAL DATA:  Fall. Additional history provided: Former smoker, COPD, hypertension, history of tubular adenoma of colon 2006, history of asthma. EXAM: CHEST  1 VIEW COMPARISON:  Prior chest radiographs 04/27/2020. FINDINGS: Shallow inspiration radiograph. Cardiomegaly. Aortic atherosclerosis. Chronic prominence of the interstitial lung markings. No appreciable airspace consolidation. No evidence of  pleural effusion or pneumothorax. No acute bony abnormality identified. Partially imaged metallic foreign body projecting over the upper lumbar spine, appearing most suggestive of an IVC filter. IMPRESSION: Shallow inspiration radiograph. No evidence of acute cardiopulmonary abnormality. Chronic prominence of the interstitial lung markings. Unchanged cardiomegaly. Aortic Atherosclerosis (ICD10-I70.0). Electronically Signed   By: Kellie Simmering DO   On: 07/09/2020 17:43   CT HEAD WO CONTRAST  Result Date: 07/31/2020 CLINICAL DATA:  Mental status change of unknown cause. History of hypertension and diabetes. EXAM: CT HEAD WITHOUT CONTRAST TECHNIQUE: Contiguous axial images were obtained from the base of the skull through the vertex without intravenous contrast. COMPARISON:  07/16/2020 FINDINGS: Brain: Mild cerebral atrophy. Mild ventricular dilatation consistent with central atrophy. Prominent low-attenuation changes throughout the deep white matter consistent with small vessel ischemia. No mass-effect or midline shift. No abnormal extra-axial fluid collections. The gray-white matter junctions are distinct. Basal cisterns are not effaced. No acute intracranial hemorrhage. Vascular: Moderate intracranial arterial vascular calcifications. Skull: The calvarium appears intact. No acute depressed skull fractures. Sinuses/Orbits: Paranasal sinuses and mastoid air cells are clear. Other: Dense subcutaneous soft tissue  nodule over the left parietal convexity likely representing a pilonidal cyst. IMPRESSION: 1. No acute intracranial abnormalities. 2. Chronic atrophy and small vessel ischemia. Electronically Signed   By: Lucienne Capers M.D.   On: 07/31/2020 00:45   CT HEAD WO CONTRAST  Result Date: 07/16/2020 CLINICAL DATA:  84 year old female with altered mental status. Neurologic deficit. EXAM: CT HEAD WITHOUT CONTRAST TECHNIQUE: Contiguous axial images were obtained from the base of the skull through the vertex  without intravenous contrast. COMPARISON:  Head CT 07/11/2020.  Brain MRI 12/16/2019. FINDINGS: Brain: Mild motion artifact today. No midline shift, mass effect, or evidence of intracranial mass lesion. Stable ventricle size and configuration. No ventriculomegaly. No acute intracranial hemorrhage identified. Advanced bilateral cerebral white matter hypodensity and including involvement of the deep white matter capsules. This appears similar to the prior MRI. Associated chronic heterogeneity of the bilateral deep gray nuclei. Stable gray-white matter differentiation throughout the brain. No cortically based acute infarct identified. Vascular: Calcified atherosclerosis at the skull base. No suspicious intracranial vascular hyperdensity. Skull: Mild motion artifact. No acute osseous abnormality identified. Sinuses/Orbits: Stable mild left mastoid effusion. Possible small volume retained secretions in the nasopharynx. Left tympanic cavity, other paranasal sinuses and mastoids remain clear. Other: Stable orbit and scalp soft tissues. IMPRESSION: 1. Mild motion artifact today. No acute or evolving infarct identified. Underlying severe chronic small vessel disease. 2. Stable mild left mastoid effusion, likely postinflammatory. Electronically Signed   By: Genevie Ann M.D.   On: 07/16/2020 16:57   CT Head Wo Contrast  Result Date: 07/11/2020 CLINICAL DATA:  Unwitnessed fall. EXAM: CT HEAD WITHOUT CONTRAST TECHNIQUE: Contiguous axial images were obtained from the base of the skull through the vertex without intravenous contrast. COMPARISON:  July 09, 2020. FINDINGS: Brain: Mild chronic ischemic white matter disease is noted. No mass effect or midline shift is noted. Ventricular size is within normal limits. There is no evidence of mass lesion, hemorrhage or acute infarction. Vascular: No hyperdense vessel or unexpected calcification. Skull: Normal. Negative for fracture or focal lesion. Sinuses/Orbits: No acute finding.  Other: None. IMPRESSION: Mild chronic ischemic white matter disease. No acute intracranial abnormality seen. Electronically Signed   By: Marijo Conception M.D.   On: 07/11/2020 13:47   CT Head Wo Contrast  Result Date: 07/09/2020 CLINICAL DATA:  [place] EXAM: CT HEAD WITHOUT CONTRAST TECHNIQUE: Contiguous axial images were obtained from the base of the skull through the vertex without intravenous contrast. COMPARISON:  04/27/2020 FINDINGS: Brain: Chronic hypodensities within the periventricular white matter and bilateral basal ganglia are stable, consistent with chronic small vessel ischemic changes. No acute infarct or hemorrhage. Lateral ventricles and remaining midline structures are unremarkable. No acute extra-axial fluid collections. No mass effect. Vascular: No hyperdense vessel or unexpected calcification. Skull: Normal. Negative for fracture or focal lesion. Minimal residual left frontal scalp hematoma, markedly decreased since previous exam. Sinuses/Orbits: No acute finding. Other: None. IMPRESSION: 1. No acute intracranial process. 2. Stable chronic small-vessel ischemic changes throughout the white matter and bilateral basal ganglia. Electronically Signed   By: Randa Ngo M.D.   On: 07/09/2020 17:37   CT Hip Right Wo Contrast  Result Date: 07/11/2020 CLINICAL DATA:  Right hip pain after fall. EXAM: CT OF THE RIGHT HIP WITHOUT CONTRAST TECHNIQUE: Multidetector CT imaging of the right hip was performed according to the standard protocol. Multiplanar CT image reconstructions were also generated. COMPARISON:  CT right hip dated July 09, 2020. FINDINGS: Bones/Joint/Cartilage No fracture or dislocation. Unchanged mild right  hip osteoarthritis. No joint effusion. Ligaments Ligaments are suboptimally evaluated by CT. Muscles and Tendons Grossly intact. Soft tissue No fluid collection or hematoma. No soft tissue mass. Sigmoid colonic diverticulosis. IMPRESSION: 1. No acute osseous abnormality.  Electronically Signed   By: Titus Dubin M.D.   On: 07/11/2020 13:40   CT Hip Right Wo Contrast  Result Date: 07/09/2020 CLINICAL DATA:  Fall in the way to the bathroom. Right hip/leg pain. EXAM: CT OF THE RIGHT HIP WITHOUT CONTRAST TECHNIQUE: Multidetector CT imaging of the right hip was performed according to the standard protocol. Multiplanar CT image reconstructions were also generated. COMPARISON:  Radiograph earlier today. FINDINGS: Bones/Joint/Cartilage No acute fracture. No dislocation. Right hip osteoarthritis with joint space narrowing and acetabular spurring. No avascular necrosis. No focal bone lesion. Degenerative change of the pubic symphysis with osteophytes. No significant hip joint effusion. Ligaments Suboptimally assessed by CT. Muscles and Tendons No intramuscular hematoma.  Muscle bulk is maintained. Soft tissues Mild skin thickening and soft tissue edema laterally. No confluent soft tissue hematoma. Diverticulosis in the sigmoid colon partially included. IMPRESSION: 1. No acute fracture or dislocation of the right hip. 2. Right hip osteoarthritis. Electronically Signed   By: Keith Rake M.D.   On: 07/09/2020 18:55   DG Chest Port 1 View  Result Date: 07/30/2020 CLINICAL DATA:  Less responsive EXAM: PORTABLE CHEST 1 VIEW COMPARISON:  07/09/2020 FINDINGS: Mild cardiomegaly with central vascular congestion. Small left-sided pleural effusion. Left basilar consolidation. Aortic atherosclerosis. No pneumothorax. IMPRESSION: Mild cardiomegaly with central vascular congestion and small left pleural effusion. Left basilar consolidation. Electronically Signed   By: Donavan Foil M.D.   On: 07/30/2020 23:49   ECHOCARDIOGRAM COMPLETE  Result Date: 07/31/2020    ECHOCARDIOGRAM REPORT   Patient Name:   Dawn Morrow Date of Exam: 07/31/2020 Medical Rec #:  [id number]    Height:       65.5 in Accession #:    [id number]   Weight:       188.1 lb Date of Birth:  1936/05/29    BSA:           1.938 m Patient Age:    62 years     BP:           117/67 mmHg Patient Gender: F            HR:           86 bpm. Exam Location:  Inpatient Procedure: 2D Echo, Color Doppler, Cardiac Doppler and Intracardiac            Opacification Agent Indications:    CHF-Acute Diastolic 297.98 / X21.19  History:        Patient has prior history of Echocardiogram examinations, most                 recent 09/17/2017. Stroke and COPD; Risk Factors:Hypertension,                 Diabetes, Dyslipidemia and Former Smoker. Asthma. Chronic kidney                 disease. Sepsis secondary to UTI. Acute metabolic                 encephalopathy.  Sonographer:    Darlina Sicilian RDCS Referring Phys: [id number] [place]  1. Left ventricular ejection fraction, by estimation, is 55 to 60%. The left ventricle has normal function. The left ventricle has no regional wall motion abnormalities. Left ventricular  diastolic function could not be evaluated.  2. Right ventricular systolic function was not well visualized. The right ventricular size is not well visualized. There is normal pulmonary artery systolic pressure.  3. A small pericardial effusion is present.  4. The mitral valve is normal in structure. Trivial mitral valve regurgitation. No evidence of mitral stenosis. Moderate mitral annular calcification.  5. The aortic valve is tricuspid. There is mild calcification of the aortic valve. There is mild thickening of the aortic valve. Aortic valve regurgitation is not visualized. Mild aortic valve sclerosis is present, with no evidence of aortic valve stenosis.  6. The inferior vena cava is normal in size with <50% respiratory variability, suggesting right atrial pressure of 8 mmHg. Comparison(s): No significant change from prior study. Conclusion(s)/Recommendation(s): Otherwise normal echocardiogram, with minor abnormalities described in the report. FINDINGS  Left Ventricle: Left ventricular ejection fraction, by estimation, is  55 to 60%. The left ventricle has normal function. The left ventricle has no regional wall motion abnormalities. Definity contrast agent was given IV to delineate the left ventricular  endocardial borders. The left ventricular internal cavity size was normal in size. There is no left ventricular hypertrophy. Left ventricular diastolic function could not be evaluated due to atrial fibrillation. Left ventricular diastolic function could  not be evaluated. Right Ventricle: The right ventricular size is not well visualized. Right vetricular wall thickness was not well visualized. Right ventricular systolic function was not well visualized. There is normal pulmonary artery systolic pressure. The tricuspid regurgitant velocity is 2.36 m/s, and with an assumed right atrial pressure of 8 mmHg, the estimated right ventricular systolic pressure is 01.7 mmHg. Left Atrium: Left atrial size was not well visualized. Right Atrium: Right atrial size was not well visualized. Pericardium: A small pericardial effusion is present. Mitral Valve: The mitral valve is normal in structure. There is mild thickening of the mitral valve leaflet(s). There is moderate calcification of the mitral valve leaflet(s). Moderate mitral annular calcification. Trivial mitral valve regurgitation. No evidence of mitral valve stenosis. Tricuspid Valve: The tricuspid valve is normal in structure. Tricuspid valve regurgitation is mild . No evidence of tricuspid stenosis. Aortic Valve: The aortic valve is tricuspid. There is mild calcification of the aortic valve. There is mild thickening of the aortic valve. There is mild to moderate aortic valve annular calcification. Aortic valve regurgitation is not visualized. Mild aortic valve sclerosis is present, with no evidence of aortic valve stenosis. Pulmonic Valve: The pulmonic valve was grossly normal. Pulmonic valve regurgitation is not visualized. No evidence of pulmonic stenosis. Aorta: The aortic root,  ascending aorta and aortic arch are all structurally normal, with no evidence of dilitation or obstruction. Venous: The inferior vena cava is normal in size with less than 50% respiratory variability, suggesting right atrial pressure of 8 mmHg. IAS/Shunts: The atrial septum is grossly normal.  LEFT VENTRICLE PLAX 2D LVIDd:         4.60 cm LVIDs:         3.20 cm LV PW:         0.80 cm LV IVS:        0.90 cm LVOT diam:     2.00 cm LV SV:         46 LV SV Index:   24 LVOT Area:     3.14 cm  LV Volumes (MOD) LV vol d, MOD A2C: 58.0 ml LV vol d, MOD A4C: 52.6 ml LV vol s, MOD A2C: 21.8 ml LV vol s, MOD  A4C: 22.0 ml LV SV MOD A2C:     36.2 ml LV SV MOD A4C:     52.6 ml LV SV MOD BP:      34.0 ml RIGHT VENTRICLE RV S prime:     7.18 cm/s LEFT ATRIUM             Index       RIGHT ATRIUM           Index LA diam:        3.90 cm 2.01 cm/m  RA Area:     13.60 cm LA Vol (A2C):   34.6 ml 17.85 ml/m RA Volume:   29.00 ml  14.96 ml/m LA Vol (A4C):   37.2 ml 19.19 ml/m LA Biplane Vol: 36.9 ml 19.04 ml/m  AORTIC VALVE LVOT Vmax:   81.60 cm/s LVOT Vmean:  60.000 cm/s LVOT VTI:    0.148 m  AORTA Ao Root diam: 3.20 cm Ao Asc diam:  3.40 cm MITRAL VALVE               TRICUSPID VALVE MV Area (PHT): 4.31 cm    TR Peak grad:   22.3 mmHg MV Decel Time: 176 msec    TR Vmax:        236.00 cm/s MV E velocity: 96.53 cm/s                            SHUNTS                            Systemic VTI:  0.15 m                            Systemic Diam: 2.00 cm Buford Dresser MD Electronically signed by Buford Dresser MD Signature Date/Time: 07/31/2020/2:52:10 PM    Final    DG Hip Unilat With Pelvis 2-3 Views Right  Result Date: 07/11/2020 CLINICAL DATA:  Right hip pain after a fall. EXAM: DG HIP (WITH OR WITHOUT PELVIS) 2-3V RIGHT COMPARISON:  Hip radiographs dated 07/09/2020. FINDINGS: There is no evidence of hip fracture or dislocation. Mild degenerative changes are seen in both hips. IMPRESSION: No acute osseous injury.  Electronically Signed   By: Zerita Boers M.D.   On: 07/11/2020 11:56   DG Hip Unilat W or Wo Pelvis 2-3 Views Right  Result Date: 07/09/2020 CLINICAL DATA:  Fall on the way to the bathroom.  Right hip pain. EXAM: DG HIP (WITH OR WITHOUT PELVIS) 2-3V RIGHT COMPARISON:  None. FINDINGS: The cortical margins of the bony pelvis and right hip are intact. No fracture. Pubic symphysis and sacroiliac joints are congruent. Both femoral heads are well-seated in the respective acetabula. Minimal degenerative spurring of the acetabulum. There may be soft tissue edema laterally versus habitus. IMPRESSION: No fracture of the pelvis or right hip. Electronically Signed   By: Keith Rake M.D.   On: 07/09/2020 17:40   DG Femur Min 2 Views Right  Result Date: 07/09/2020 CLINICAL DATA:  Recent fall with right leg pain, initial encounter EXAM: RIGHT FEMUR 2 VIEWS COMPARISON:  None. FINDINGS: Degenerative changes about the knee joint are noted most prominent medially. No acute fracture or dislocation is noted. No joint effusion is seen. No gross soft tissue abnormality is noted. IMPRESSION: Degenerative change without acute abnormality. Electronically Signed   By: Linus Mako.D.  On: 07/09/2020 18:29

## 2020-08-02 DIAGNOSIS — J189 Pneumonia, unspecified organism: Secondary | ICD-10-CM | POA: Diagnosis not present

## 2020-08-02 DIAGNOSIS — G9341 Metabolic encephalopathy: Secondary | ICD-10-CM | POA: Diagnosis not present

## 2020-08-02 DIAGNOSIS — N179 Acute kidney failure, unspecified: Secondary | ICD-10-CM | POA: Diagnosis not present

## 2020-08-02 DIAGNOSIS — N390 Urinary tract infection, site not specified: Secondary | ICD-10-CM | POA: Diagnosis not present

## 2020-08-02 LAB — COMPREHENSIVE METABOLIC PANEL
ALT: 19 U/L (ref 0–44)
AST: 15 U/L (ref 15–41)
Albumin: 2.2 g/dL — ABNORMAL LOW (ref 3.5–5.0)
Alkaline Phosphatase: 66 U/L (ref 38–126)
Anion gap: 13 (ref 5–15)
BUN: 27 mg/dL — ABNORMAL HIGH (ref 8–23)
CO2: 27 mmol/L (ref 22–32)
Calcium: 8.1 mg/dL — ABNORMAL LOW (ref 8.9–10.3)
Chloride: 98 mmol/L (ref 98–111)
Creatinine, Ser: 1.34 mg/dL — ABNORMAL HIGH (ref 0.44–1.00)
GFR, Estimated: 40 mL/min — ABNORMAL LOW (ref >60)
Glucose, Bld: 93 mg/dL (ref 70–99)
Potassium: 3.1 mmol/L — ABNORMAL LOW (ref 3.5–5.1)
Sodium: 138 mmol/L (ref 135–145)
Total Bilirubin: 0.4 mg/dL (ref 0.3–1.2)
Total Protein: 5.3 g/dL — ABNORMAL LOW (ref 6.5–8.1)

## 2020-08-02 LAB — CBC WITH DIFFERENTIAL/PLATELET
Abs Immature Granulocytes: 0.14 10*3/uL — ABNORMAL HIGH (ref 0.00–0.07)
Basophils Absolute: 0.1 10*3/uL (ref 0.0–0.1)
Basophils Relative: 0 %
Eosinophils Absolute: 0.4 10*3/uL (ref 0.0–0.5)
Eosinophils Relative: 3 %
HCT: 34.8 % — ABNORMAL LOW (ref 36.0–46.0)
Hemoglobin: 10.5 g/dL — ABNORMAL LOW (ref 12.0–15.0)
Immature Granulocytes: 1 %
Lymphocytes Relative: 17 %
Lymphs Abs: 2.9 10*3/uL (ref 0.7–4.0)
MCH: 28.3 pg (ref 26.0–34.0)
MCHC: 30.2 g/dL (ref 30.0–36.0)
MCV: 93.8 fL (ref 80.0–100.0)
Monocytes Absolute: 1 10*3/uL (ref 0.1–1.0)
Monocytes Relative: 5 %
Neutro Abs: 13.1 10*3/uL — ABNORMAL HIGH (ref 1.7–7.7)
Neutrophils Relative %: 74 %
Platelets: 275 10*3/uL (ref 150–400)
RBC: 3.71 MIL/uL — ABNORMAL LOW (ref 3.87–5.11)
RDW: 16.6 % — ABNORMAL HIGH (ref 11.5–15.5)
WBC: 17.7 10*3/uL — ABNORMAL HIGH (ref 4.0–10.5)
nRBC: 0 % (ref 0.0–0.2)

## 2020-08-02 LAB — GLUCOSE, CAPILLARY
Glucose-Capillary: 96 mg/dL (ref 70–99)
Glucose-Capillary: 182 mg/dL — ABNORMAL HIGH (ref 70–99)
Glucose-Capillary: 150 mg/dL — ABNORMAL HIGH (ref 70–99)
Glucose-Capillary: 191 mg/dL — ABNORMAL HIGH (ref 70–99)

## 2020-08-02 LAB — PROCALCITONIN: Procalcitonin: 0.18 ng/mL

## 2020-08-02 LAB — URINE CULTURE: Culture: >=100000 — AB

## 2020-08-02 LAB — SARS CORONAVIRUS 2 BY RT PCR (HOSPITAL ORDER, PERFORMED IN ~~LOC~~ HOSPITAL LAB): SARS Coronavirus 2: NEGATIVE

## 2020-08-02 LAB — BRAIN NATRIURETIC PEPTIDE: B Natriuretic Peptide: 2760.5 pg/mL — ABNORMAL HIGH (ref 0.0–100.0)

## 2020-08-02 LAB — MAGNESIUM: Magnesium: 1.7 mg/dL (ref 1.7–2.4)

## 2020-08-02 MED ORDER — LEVOFLOXACIN 250 MG PO TABS
250.0000 mg | ORAL_TABLET | ORAL | Status: DC
  Administered 2020-08-02: 250 mg via ORAL
  Filled 2020-08-02 (×2): qty 1

## 2020-08-02 MED ORDER — SODIUM CHLORIDE 0.9 % IV SOLN: 2.0000 g | INTRAVENOUS | Status: DC

## 2020-08-02 MED ORDER — SODIUM CHLORIDE 0.9 % IV SOLN
500.0000 mg | Freq: Three times a day (TID) | INTRAVENOUS | Status: DC
  Administered 2020-08-02 – 2020-08-03 (×2): 500 mg via INTRAVENOUS
  Filled 2020-08-02 (×5): qty 500

## 2020-08-02 MED ORDER — POTASSIUM CHLORIDE CRYS ER 20 MEQ PO TBCR
40.0000 meq | EXTENDED_RELEASE_TABLET | Freq: Once | ORAL | Status: AC
  Administered 2020-08-02: 40 meq via ORAL
  Filled 2020-08-02: qty 2

## 2020-08-02 NOTE — NC FL2 (Signed)
[place] LEVEL OF CARE SCREENING TOOL     IDENTIFICATION  Patient Name: Dawn Morrow Birthdate: 05-20-1936 Sex: female Admission Date (Current Location): 07/30/2020  [facility] and [place] Number:  Herbalist and Address:  [facility] [place], [place] [address], [place], [place] [address]      Provider Number: [id number]  Attending Physician Name and Address:  Jonetta Osgood, MD  Relative Name and Phone Number:  [name] niece, [phone]/[id number] "[place]   [phone]    Current Level of Care: Hospital Recommended Level of Care: [place] Prior Approval Number:    Date Approved/Denied:   PASRR Number: [id number]  Discharge Plan: SNF    Current Diagnoses: Patient Active Problem List   Diagnosis Date Noted  . UTI (urinary tract infection) 07/31/2020  . CAP (community acquired pneumonia) 07/31/2020  . Pressure injury of skin 07/31/2020  . Malnutrition of moderate degree 07/19/2020  . Adjustment disorder with depressed mood 07/13/2020  . Dehydration 07/12/2020  . Fall 07/11/2020  . Acute metabolic encephalopathy 01/60/1093  . AMS (altered mental status) 03/26/2020  . History of pulmonary embolism 03/26/2020  . History of DVT (deep vein thrombosis) 03/26/2020  . Atrial fibrillation, chronic ([place]) 03/26/2020  . Sepsis ([place]) 12/14/2019  . Acute encephalopathy 12/14/2019  . COVID-19 virus infection 10/02/2019  . Acute lower UTI 10/02/2019  . AKI (acute kidney injury) ([place]) 10/01/2019  . Syncope 09/18/2017  . Near syncope 09/17/2017  . Diabetes mellitus type 2 in obese ([place]) 09/17/2017  . Atrial fibrillation with RVR ([place]) 09/17/2017  . Hypomagnesemia 09/17/2017  . CKD (chronic kidney disease), stage III ([place]) 09/17/2017  . Forehead contusion   . MGUS (monoclonal gammopathy of unknown significance) 08/13/2016  . Renal failure 07/09/2015  . Follow-up ---------PCP NOTES 05/17/2015  . Essential tremor  12/28/2014  . Ulnar neuropathy of left upper extremity 12/28/2014  . Intractable pain 06/21/2014  . Back pain 06/21/2014  . Lumbar radiculopathy 06/21/2014  . Encounter for therapeutic drug monitoring 03/14/2014  . Numbness 10/18/2013  . TIA (transient ischemic attack) 09/19/2013  . Weakness 09/18/2013  . Annual physical exam 05/30/2012  . Tremor 01/21/2012  . Failure to thrive and poor med compliance  01/21/2012  . Pulmonary embolism ([place]) 11/18/2010  . DVT (deep venous thrombosis) ([place]) 11/18/2010  . Long term current use of anticoagulant 11/18/2010  . ABNORMAL ELECTROCARDIOGRAM 11/10/2010  . OSTEOARTHRITIS 08/06/2010  . DIZZINESS 04/15/2010  . Diabetes ([place]) 06/04/2009  . ACNE ROSACEA 11/28/2008  . BACK PAIN 05/16/2008  . HIP PAIN, RIGHT, CHRONIC 09/15/2007  . Hypothyroidism 09/13/2007  . Dyslipidemia 09/13/2007  . Osteoporosis 07/22/2007  . DEPRESSION 09/11/2006  . HTN (hypertension) 09/11/2006  . ASTHMA 09/11/2006  . COPD (chronic obstructive pulmonary disease) ([place]) 09/11/2006  . GERD 09/11/2006  . Recurrent UTI 09/11/2006  . GREENFIELD FILTER INSERTION, HX OF 09/11/2006    Orientation RESPIRATION BLADDER Height & Weight     Self, Time  O2 (3L Nasal cannula) Incontinent, External catheter Weight: 189 lb 13.1 oz (86.1 kg) Height:  5' 5.5" (166.4 cm)  BEHAVIORAL SYMPTOMS/MOOD NEUROLOGICAL BOWEL NUTRITION STATUS      Incontinent Diet (See DC Summary)  AMBULATORY STATUS COMMUNICATION OF NEEDS Skin   Extensive Assist Verbally PU Stage and Appropriate Care (Stage II on buttocks)                       Personal Care Assistance Level of Assistance  Bathing, Dressing, Feeding  Bathing Assistance: Maximum assistance Feeding assistance: Limited assistance Dressing Assistance: Maximum assistance     Functional Limitations Info  Sight, Hearing, Speech Sight Info: Impaired Hearing Info: Impaired Speech Info: Adequate    SPECIAL CARE FACTORS FREQUENCY  PT (By  licensed PT), OT (By licensed OT)     PT Frequency: 5x/week OT Frequency: 5x/week            Contractures Contractures Info: Not present    Additional Factors Info  Code Status, Allergies, Psychotropic, Insulin Sliding Scale, Isolation Precautions Code Status Info: DNR Allergies Info: Penicillins, Codeine, Sulfonylureas, Sulfonamide Derivatives Psychotropic Info: Wellbutrin; Zoloft Insulin Sliding Scale Info: See DC Summary Isolation Precautions Info: MRSA     Current Medications (08/02/2020):  This is the current hospital active medication list Current Facility-Administered Medications  Medication Dose Route Frequency Provider Last Rate Last Admin  . acetaminophen (TYLENOL) tablet 650 mg  650 mg Oral Q6H PRN Shela Leff, MD       Or  . acetaminophen (TYLENOL) suppository 650 mg  650 mg Rectal Q6H PRN Shela Leff, MD      . albuterol (VENTOLIN HFA) 108 (90 Base) MCG/ACT inhaler 2 puff  2 puff Inhalation Q6H PRN Shela Leff, MD      . atorvastatin (LIPITOR) tablet 10 mg  10 mg Oral QHS Shela Leff, MD   10 mg at 08/01/20 2222  . buPROPion (WELLBUTRIN XL) 24 hr tablet 300 mg  300 mg Oral Daily Shela Leff, MD   300 mg at 08/02/20 0817  . carvedilol (COREG) tablet 12.5 mg  12.5 mg Oral BID WC Shela Leff, MD   12.5 mg at 08/02/20 0817  . Chlorhexidine Gluconate Cloth 2 % PADS 6 each  6 each Topical Q0600 Thurnell Lose, MD   6 each at 08/02/20 0531  . cholecalciferol (VITAMIN D3) tablet 2,000 Units  2,000 Units Oral Daily Shela Leff, MD   2,000 Units at 08/02/20 0817  . docusate sodium (COLACE) capsule 100 mg  100 mg Oral Daily Shela Leff, MD   100 mg at 08/01/20 0908  . ezetimibe (ZETIA) tablet 10 mg  10 mg Oral Daily Shela Leff, MD   10 mg at 08/02/20 0817  . gabapentin (NEURONTIN) capsule 300 mg  300 mg Oral BID Shela Leff, MD   300 mg at 08/02/20 1151   And  . gabapentin (NEURONTIN) capsule 600 mg  600  mg Oral QHS Shela Leff, MD   600 mg at 08/01/20 2222  . insulin aspart (novoLOG) injection 0-9 Units  0-9 Units Subcutaneous TID WC Shela Leff, MD   2 Units at 08/02/20 1151  . insulin glargine (LANTUS) injection 5 Units  5 Units Subcutaneous QHS Shela Leff, MD   5 Units at 08/01/20 2222  . levofloxacin (LEVAQUIN) tablet 250 mg  250 mg Oral Q24H Karren Cobble, RPH      . levothyroxine (SYNTHROID) tablet 88 mcg  88 mcg Oral QAC breakfast Shela Leff, MD   88 mcg at 08/02/20 0817  . loperamide (IMODIUM) capsule 2 mg  2 mg Oral PRN Jonetta Osgood, MD   2 mg at 08/01/20 1233  . multivitamin with minerals tablet 1 tablet  1 tablet Oral Daily Shela Leff, MD   1 tablet at 08/02/20 0817  . mupirocin ointment (BACTROBAN) 2 % 1 application  1 application Nasal BID Thurnell Lose, MD   1 application at 62/70/35 0818  . polyethylene glycol (MIRALAX / GLYCOLAX) packet 17 g  17 g  Oral Daily PRN Shela Leff, MD      . Rivaroxaban Alveda Reasons) tablet 15 mg  15 mg Oral QPM Shela Leff, MD   15 mg at 08/01/20 1729  . sertraline (ZOLOFT) tablet 100 mg  100 mg Oral Daily Shela Leff, MD   100 mg at 08/02/20 2780     Discharge Medications: Please see discharge summary for a list of discharge medications.  Relevant Imaging Results:  Relevant Lab Results:   Additional Information SS#: [id number]  Benard Halsted, LCSW

## 2020-08-02 NOTE — Progress Notes (Signed)
PROGRESS NOTE                                                                                                                                                                                                             Patient Demographics:    Dawn Morrow, is a 84 y.o. female, DOB - 11-Mar-1936, TDD:[id number]  Outpatient Primary MD for the patient is Wenda Low, MD    LOS - 2  Admit date - 07/30/2020    Chief Complaint  Patient presents with  . Altered Mental Status       Brief Narrative (HPI from H&P) - Dawn Morrow is a 84 y.o. female with medical history significant of dementia, frequent falls, anxiety and depression, asthma, COPD, chronic hypoxic and hypercapnic respiratory failure on 3 L home oxygen, diabetes, history of DVT and PE, persistent A. fib on Xarelto, stroke, hypertension, hyperlipidemia, hypothyroidism, CKD stage IIIb presenting the ED via EMS from her nursing home for evaluation of altered status, she has been bedbound for about 2 months and has had recurrent UTIs.  In the ER was found to have UTI and admitted to the hospital.   Subjective:   Lying comfortably in bed-no major issues overnight.   Assessment  & Plan :   Sepsis due to UTI and possible HCAP/aspiration pneumonia: Sepsis physiology has resolved-leukocytosis downtrending-urine culture positive for Proteus-awaiting sensitivities.  Blood cultures negative so far.  Remains on IV levofloxacin. .  Per prior notes-patient is essentially bedbound at [facility] has had recurrent bouts of UTI recently.  Acute metabolic encephalopathy: Secondary to above-currently pleasantly confused-but able to answer most of my questions appropriately.  CT head on admission was negative.  AKI on CKD stage IIIb: AKI hemodynamically mediated-creatinine close to baseline.  Hyperkalemia: Resolved  Chronic diastolic heart failure: Euvolemic on exam-follow.  Chronic atrial  fibrillation: Rate controlled-on Coreg and Xarelto.  HTN: BP controlled-continue Coreg.  HLD: Continue Lipitor/Zetia  DM-2: CBG stable-continue Lantus 5 units daily and SSI.  Follow and adjust.  Recent Labs    08/01/20 2038 08/02/20 0718 08/02/20 1141  GLUCAP 180* 96 182*    COPD with chronic hypoxic respiratory failure: No signs of exacerbation-continue bronchodilators.  Apparently on 2 L at all times.  Hypothyroidism: Continue Synthroid-TSH stable.  Anxiety/depression/dementia: Seems to be at baseline-continue Zoloft and Wellbutrin.  Condition -  Guarded  Family Communication  : Foy [place] [phone] on 08/02/2020  Code Status :  DNR  Consults  :  None  Procedures  :    CT Head - Non acute  PUD Prophylaxis : None  Disposition Plan  :    Status is: Inpatient  Remains inpatient appropriate because:IV treatments appropriate due to intensity of illness or inability to take PO   Dispo: The patient is from: SNF              Anticipated d/c is to: SNF              Anticipated d/c date is: 3 days              Patient currently is not medically stable to d/c.   DVT Prophylaxis  :  Xarelto  Lab Results  Component Value Date   PLT 275 08/02/2020    Diet :  Diet Order            DIET SOFT Room service appropriate? No; Fluid consistency: Thin  Diet effective now                  Inpatient Medications  Scheduled Meds: . atorvastatin  10 mg Oral QHS  . buPROPion  300 mg Oral Daily  . carvedilol  12.5 mg Oral BID WC  . Chlorhexidine Gluconate Cloth  6 each Topical Q0600  . cholecalciferol  2,000 Units Oral Daily  . docusate sodium  100 mg Oral Daily  . ezetimibe  10 mg Oral Daily  . gabapentin  300 mg Oral BID   And  . gabapentin  600 mg Oral QHS  . insulin aspart  0-9 Units Subcutaneous TID WC  . insulin glargine  5 Units Subcutaneous QHS  . levofloxacin  250 mg Oral Q24H  . levothyroxine  88 mcg Oral QAC breakfast  . multivitamin with minerals   1 tablet Oral Daily  . mupirocin ointment  1 application Nasal BID  . Rivaroxaban  15 mg Oral QPM  . sertraline  100 mg Oral Daily   Continuous Infusions:  PRN Meds:.acetaminophen **OR** acetaminophen, albuterol, loperamide, polyethylene glycol  Antibiotics  :    Anti-infectives (From admission, onward)   Start     Dose/Rate Route Frequency Ordered Stop   08/02/20 2200  levofloxacin (LEVAQUIN) tablet 500 mg  Status:  Discontinued        500 mg Oral Every 48 hours 08/01/20 0755 08/02/20 1328   08/02/20 1400  levofloxacin (LEVAQUIN) tablet 250 mg        250 mg Oral Every 24 hours 08/02/20 1328     08/01/20 2200  levofloxacin (LEVAQUIN) IVPB 500 mg  Status:  Discontinued        500 mg 100 mL/hr over 60 Minutes Intravenous Every 48 hours 07/31/20 0326 08/01/20 0755   07/31/20 0100  levofloxacin (LEVAQUIN) IVPB 750 mg        750 mg 100 mL/hr over 90 Minutes Intravenous  Once 07/31/20 0055 07/31/20 0352       Time Spent in minutes  30   Oren Binet M.D on 08/02/2020 at 3:41 PM  To page go to [email or web address] - password [facility]  [facility] -  Office  [phone]   See all Orders from today for further details    Objective:   Vitals:   08/02/20 0327 08/02/20 0450 08/02/20 0900 08/02/20 1142  BP:  99/60  (!) 112/58  Pulse:  81  84  Resp:  18  17  Temp:  97.8 F (36.6 C)  98.4 F (36.9 C)  TempSrc:  Axillary  Oral  SpO2:  97% 97% 95%  Weight: 86.1 kg     Height:        Wt Readings from Last 3 Encounters:  08/02/20 86.1 kg  07/12/20 84.3 kg  06/19/20 88.9 kg     Intake/Output Summary (Last 24 hours) at 08/02/2020 1541 Last data filed at 08/02/2020 1434 Gross per 24 hour  Intake 120 ml  Output 700 ml  Net -580 ml     Physical Exam Gen Exam: Pleasantly confused-answers some questions appropriately-looks very frail-but not in any distress. HEENT:atraumatic, normocephalic Chest: B/L clear to auscultation anteriorly CVS:S1S2 regular Abdomen:soft non  tender, non distended Extremities:no edema Neurology: Seems to be moving all 4 extremities but appears very frail and chronically weak appearing  skin: no rash    Data Review:    CBC Recent Labs  Lab 07/30/20 2344 07/31/20 0023 07/31/20 0359 08/01/20 0149 08/02/20 0229  WBC 24.2*  --  23.8* 25.7* 17.7*  HGB 13.0 14.6 11.4* 11.1* 10.5*  HCT 42.9 43.0 38.4 36.7 34.8*  PLT 366  --  366 237 275  MCV 95.3  --  95.3 94.1 93.8  MCH 28.9  --  28.3 28.5 28.3  MCHC 30.3  --  29.7* 30.2 30.2  RDW 16.6*  --  16.7* 16.8* 16.6*  LYMPHSABS 2.2  --   --  3.3 2.9  MONOABS 0.7  --   --  1.2* 1.0  EOSABS 0.0  --   --  0.2 0.4  BASOSABS 0.1  --   --  0.1 0.1    Recent Labs  Lab 07/30/20 2334 07/30/20 2344 07/31/20 0023 07/31/20 0359 07/31/20 0754 08/01/20 0149 08/01/20 0449 08/02/20 0229  NA  --  139 139 139  --  138  --  138  K  --  5.2* 5.1 5.3*  --  3.6  --  3.1*  CL  --  99 100 99  --  102  --  98  CO2  --  29  --  30  --  23  --  27  GLUCOSE  --  151* 142* 148*  --  103*  --  93  BUN  --  34* 37* 33*  --  28*  --  27*  CREATININE  --  1.83* 1.80* 1.75*  --  1.36*  --  1.34*  CALCIUM  --  9.3  --  8.8*  --  8.3*  --  8.1*  AST  --  32  --   --   --  22  --  15  ALT  --  35  --   --   --  21  --  19  ALKPHOS  --  88  --   --   --  75  --  66  BILITOT  --  0.9  --   --   --  0.5  --  0.4  ALBUMIN  --  2.9*  --   --   --  2.4*  --  2.2*  MG  --   --   --   --   --  1.7  --  1.7  PROCALCITON  --   --   --   --  0.11  --  <0.10 0.18  LATICACIDVEN 1.6  --   --   --   --   --   --   --  TSH  --  4.490  --   --   --   --   --   --   AMMONIA 19  --   --   --   --   --   --   --   BNP  --   --   --  307.6*  --  221.7*  --  2,760.5*    ------------------------------------------------------------------------------------------------------------------ No results for input(s): CHOL, HDL, LDLCALC, TRIG, CHOLHDL, LDLDIRECT in the last 72 hours.  Lab Results  Component Value Date    HGBA1C 5.1 07/11/2020   ------------------------------------------------------------------------------------------------------------------ Recent Labs    07/30/20 2344  TSH 4.490    Cardiac Enzymes No results for input(s): CKMB, TROPONINI, MYOGLOBIN in the last 168 hours.  Invalid input(s): CK ------------------------------------------------------------------------------------------------------------------    Component Value Date/Time   BNP 2,760.5 (H) 08/02/2020 0300    Micro Results Recent Results (from the past 240 hour(s))  Blood Cultures (routine x 2)     Status: None (Preliminary result)   Collection Time: 07/30/20 11:34 PM   Specimen: BLOOD  Result Value Ref Range Status   Specimen Description BLOOD RIGHT ANTECUBITAL  Final   Special Requests   Final    BOTTLES DRAWN AEROBIC AND ANAEROBIC Blood Culture adequate volume   Culture   Final    NO GROWTH 2 DAYS Performed at [facility], [address] [place], [place] [address]    Report Status PENDING  Incomplete  Respiratory Panel by RT PCR (Flu A&B, Covid) - Nasopharyngeal Swab     Status: None   Collection Time: 07/30/20 11:44 PM   Specimen: Nasopharyngeal Swab; Nasopharyngeal(NP) swabs in vial transport medium  Result Value Ref Range Status   SARS Coronavirus 2 by RT PCR NEGATIVE NEGATIVE Final    Comment: (NOTE) SARS-CoV-2 target nucleic acids are NOT DETECTED.  The SARS-CoV-2 RNA is generally detectable in upper respiratoy specimens during the acute phase of infection. The lowest concentration of SARS-CoV-2 viral copies this assay can detect is 131 copies/mL. A negative result does not preclude SARS-Cov-2 infection and should not be used as the sole basis for treatment or other patient management decisions. A negative result may occur with  improper specimen collection/handling, submission of specimen other than nasopharyngeal swab, presence of viral mutation(s) within the areas targeted by this assay,  and inadequate number of viral copies (<131 copies/mL). A negative result must be combined with clinical observations, patient history, and epidemiological information. The expected result is Negative.  Fact Sheet for Patients:  PinkCheek.be  Fact Sheet for Healthcare Providers:  GravelBags.it  This test is no t yet approved or cleared by the [place] FDA and  has been authorized for detection and/or diagnosis of SARS-CoV-2 by FDA under an Emergency Use Authorization (EUA). This EUA will remain  in effect (meaning this test can be used) for the duration of the COVID-19 declaration under Section 564(b)(1) of the Act, 21 U.S.C. section 360bbb-3(b)(1), unless the authorization is terminated or revoked sooner.     Influenza A by PCR NEGATIVE NEGATIVE Final   Influenza B by PCR NEGATIVE NEGATIVE Final    Comment: (NOTE) The Xpert Xpress SARS-CoV-2/FLU/RSV assay is intended as an aid in  the diagnosis of influenza from Nasopharyngeal swab specimens and  should not be used as a sole basis for treatment. Nasal washings and  aspirates are unacceptable for Xpert Xpress SARS-CoV-2/FLU/RSV  testing.  Fact Sheet for Patients: PinkCheek.be  Fact Sheet for Healthcare Providers: GravelBags.it  This  test is not yet approved or cleared by the [place] and  has been authorized for detection and/or diagnosis of SARS-CoV-2 by  FDA under an Emergency Use Authorization (EUA). This EUA will remain  in effect (meaning this test can be used) for the duration of the  Covid-19 declaration under Section 564(b)(1) of the Act, 21  U.S.C. section 360bbb-3(b)(1), unless the authorization is  terminated or revoked. Performed at [facility], [place] [address], [place], [place] [address]   Urine culture     Status: Abnormal (Preliminary result)   Collection Time: 07/31/20   1:12 AM   Specimen: Urine, Random  Result Value Ref Range Status   Specimen Description URINE, RANDOM  Final   Special Requests NONE  Final   Culture (A)  Final    >=100,000 COLONIES/mL PROTEUS MIRABILIS SUSCEPTIBILITIES TO FOLLOW Performed at [facility], [place] [address], [place], [place] [address]    Report Status PENDING  Incomplete  MRSA PCR Screening     Status: Abnormal   Collection Time: 07/31/20  5:40 AM   Specimen: Nasal Mucosa; Nasopharyngeal  Result Value Ref Range Status   MRSA by PCR POSITIVE (A) NEGATIVE Final    Comment:        The GeneXpert MRSA Assay (FDA approved for NASAL specimens only), is one component of a comprehensive MRSA colonization surveillance program. It is not intended to diagnose MRSA infection nor to guide or monitor treatment for MRSA infections. CRITICAL RESULT CALLED TO, READ BACK BY AND VERIFIED WITH: RN LIANA K. AT 1941 07/31/20 BY MM. Performed at [facility], [place] [address] [place], [place] [address]   Culture, blood (Routine X 2) w Reflex to ID Panel     Status: None (Preliminary result)   Collection Time: 07/31/20 12:29 PM   Specimen: BLOOD RIGHT FOREARM  Result Value Ref Range Status   Specimen Description BLOOD RIGHT FOREARM  Final   Special Requests   Final    BOTTLES DRAWN AEROBIC AND ANAEROBIC Blood Culture adequate volume   Culture   Final    NO GROWTH 2 DAYS Performed at [facility], [place] [address], [place], [place] [address]    Report Status PENDING  Incomplete    Radiology Reports DG Chest 1 View  Result Date: 07/09/2020 CLINICAL DATA:  Fall. Additional history provided: Former smoker, COPD, hypertension, history of tubular adenoma of colon 2006, history of asthma. EXAM: CHEST  1 VIEW COMPARISON:  Prior chest radiographs 04/27/2020. FINDINGS: Shallow inspiration radiograph. Cardiomegaly. Aortic atherosclerosis. Chronic prominence of the interstitial lung markings. No appreciable airspace  consolidation. No evidence of pleural effusion or pneumothorax. No acute bony abnormality identified. Partially imaged metallic foreign body projecting over the upper lumbar spine, appearing most suggestive of an IVC filter. IMPRESSION: Shallow inspiration radiograph. No evidence of acute cardiopulmonary abnormality. Chronic prominence of the interstitial lung markings. Unchanged cardiomegaly. Aortic Atherosclerosis (ICD10-I70.0). Electronically Signed   By: Kellie Simmering DO   On: 07/09/2020 17:43   CT HEAD WO CONTRAST  Result Date: 07/31/2020 CLINICAL DATA:  Mental status change of unknown cause. History of hypertension and diabetes. EXAM: CT HEAD WITHOUT CONTRAST TECHNIQUE: Contiguous axial images were obtained from the base of the skull through the vertex without intravenous contrast. COMPARISON:  07/16/2020 FINDINGS: Brain: Mild cerebral atrophy. Mild ventricular dilatation consistent with central atrophy. Prominent low-attenuation changes throughout the deep white matter consistent with small vessel ischemia. No mass-effect or midline shift. No abnormal  extra-axial fluid collections. The gray-white matter junctions are distinct. Basal cisterns are not effaced. No acute intracranial hemorrhage. Vascular: Moderate intracranial arterial vascular calcifications. Skull: The calvarium appears intact. No acute depressed skull fractures. Sinuses/Orbits: Paranasal sinuses and mastoid air cells are clear. Other: Dense subcutaneous soft tissue nodule over the left parietal convexity likely representing a pilonidal cyst. IMPRESSION: 1. No acute intracranial abnormalities. 2. Chronic atrophy and small vessel ischemia. Electronically Signed   By: Lucienne Capers M.D.   On: 07/31/2020 00:45   CT HEAD WO CONTRAST  Result Date: 07/16/2020 CLINICAL DATA:  84 year old female with altered mental status. Neurologic deficit. EXAM: CT HEAD WITHOUT CONTRAST TECHNIQUE: Contiguous axial images were obtained from the base of the  skull through the vertex without intravenous contrast. COMPARISON:  Head CT 07/11/2020.  Brain MRI 12/16/2019. FINDINGS: Brain: Mild motion artifact today. No midline shift, mass effect, or evidence of intracranial mass lesion. Stable ventricle size and configuration. No ventriculomegaly. No acute intracranial hemorrhage identified. Advanced bilateral cerebral white matter hypodensity and including involvement of the deep white matter capsules. This appears similar to the prior MRI. Associated chronic heterogeneity of the bilateral deep gray nuclei. Stable gray-white matter differentiation throughout the brain. No cortically based acute infarct identified. Vascular: Calcified atherosclerosis at the skull base. No suspicious intracranial vascular hyperdensity. Skull: Mild motion artifact. No acute osseous abnormality identified. Sinuses/Orbits: Stable mild left mastoid effusion. Possible small volume retained secretions in the nasopharynx. Left tympanic cavity, other paranasal sinuses and mastoids remain clear. Other: Stable orbit and scalp soft tissues. IMPRESSION: 1. Mild motion artifact today. No acute or evolving infarct identified. Underlying severe chronic small vessel disease. 2. Stable mild left mastoid effusion, likely postinflammatory. Electronically Signed   By: Genevie Ann M.D.   On: 07/16/2020 16:57   CT Head Wo Contrast  Result Date: 07/11/2020 CLINICAL DATA:  Unwitnessed fall. EXAM: CT HEAD WITHOUT CONTRAST TECHNIQUE: Contiguous axial images were obtained from the base of the skull through the vertex without intravenous contrast. COMPARISON:  July 09, 2020. FINDINGS: Brain: Mild chronic ischemic white matter disease is noted. No mass effect or midline shift is noted. Ventricular size is within normal limits. There is no evidence of mass lesion, hemorrhage or acute infarction. Vascular: No hyperdense vessel or unexpected calcification. Skull: Normal. Negative for fracture or focal lesion.  Sinuses/Orbits: No acute finding. Other: None. IMPRESSION: Mild chronic ischemic white matter disease. No acute intracranial abnormality seen. Electronically Signed   By: Marijo Conception M.D.   On: 07/11/2020 13:47   CT Head Wo Contrast  Result Date: 07/09/2020 CLINICAL DATA:  [place] EXAM: CT HEAD WITHOUT CONTRAST TECHNIQUE: Contiguous axial images were obtained from the base of the skull through the vertex without intravenous contrast. COMPARISON:  04/27/2020 FINDINGS: Brain: Chronic hypodensities within the periventricular white matter and bilateral basal ganglia are stable, consistent with chronic small vessel ischemic changes. No acute infarct or hemorrhage. Lateral ventricles and remaining midline structures are unremarkable. No acute extra-axial fluid collections. No mass effect. Vascular: No hyperdense vessel or unexpected calcification. Skull: Normal. Negative for fracture or focal lesion. Minimal residual left frontal scalp hematoma, markedly decreased since previous exam. Sinuses/Orbits: No acute finding. Other: None. IMPRESSION: 1. No acute intracranial process. 2. Stable chronic small-vessel ischemic changes throughout the white matter and bilateral basal ganglia. Electronically Signed   By: Randa Ngo M.D.   On: 07/09/2020 17:37   CT Hip Right Wo Contrast  Result Date: 07/11/2020 CLINICAL DATA:  Right hip pain after fall.  EXAM: CT OF THE RIGHT HIP WITHOUT CONTRAST TECHNIQUE: Multidetector CT imaging of the right hip was performed according to the standard protocol. Multiplanar CT image reconstructions were also generated. COMPARISON:  CT right hip dated July 09, 2020. FINDINGS: Bones/Joint/Cartilage No fracture or dislocation. Unchanged mild right hip osteoarthritis. No joint effusion. Ligaments Ligaments are suboptimally evaluated by CT. Muscles and Tendons Grossly intact. Soft tissue No fluid collection or hematoma. No soft tissue mass. Sigmoid colonic diverticulosis. IMPRESSION: 1. No  acute osseous abnormality. Electronically Signed   By: Titus Dubin M.D.   On: 07/11/2020 13:40   CT Hip Right Wo Contrast  Result Date: 07/09/2020 CLINICAL DATA:  Fall in the way to the bathroom. Right hip/leg pain. EXAM: CT OF THE RIGHT HIP WITHOUT CONTRAST TECHNIQUE: Multidetector CT imaging of the right hip was performed according to the standard protocol. Multiplanar CT image reconstructions were also generated. COMPARISON:  Radiograph earlier today. FINDINGS: Bones/Joint/Cartilage No acute fracture. No dislocation. Right hip osteoarthritis with joint space narrowing and acetabular spurring. No avascular necrosis. No focal bone lesion. Degenerative change of the pubic symphysis with osteophytes. No significant hip joint effusion. Ligaments Suboptimally assessed by CT. Muscles and Tendons No intramuscular hematoma.  Muscle bulk is maintained. Soft tissues Mild skin thickening and soft tissue edema laterally. No confluent soft tissue hematoma. Diverticulosis in the sigmoid colon partially included. IMPRESSION: 1. No acute fracture or dislocation of the right hip. 2. Right hip osteoarthritis. Electronically Signed   By: Keith Rake M.D.   On: 07/09/2020 18:55   DG Chest Port 1 View  Result Date: 07/30/2020 CLINICAL DATA:  Less responsive EXAM: PORTABLE CHEST 1 VIEW COMPARISON:  07/09/2020 FINDINGS: Mild cardiomegaly with central vascular congestion. Small left-sided pleural effusion. Left basilar consolidation. Aortic atherosclerosis. No pneumothorax. IMPRESSION: Mild cardiomegaly with central vascular congestion and small left pleural effusion. Left basilar consolidation. Electronically Signed   By: Donavan Foil M.D.   On: 07/30/2020 23:49   ECHOCARDIOGRAM COMPLETE  Result Date: 07/31/2020    ECHOCARDIOGRAM REPORT   Patient Name:   Dawn Morrow Date of Exam: 07/31/2020 Medical Rec #:  [id number]    Height:       65.5 in Accession #:    [id number]   Weight:       188.1 lb Date of Birth:   02-26-1936    BSA:          1.938 m Patient Age:    4 years     BP:           117/67 mmHg Patient Gender: F            HR:           86 bpm. Exam Location:  Inpatient Procedure: 2D Echo, Color Doppler, Cardiac Doppler and Intracardiac            Opacification Agent Indications:    CHF-Acute Diastolic 916.38 / G66.59  History:        Patient has prior history of Echocardiogram examinations, most                 recent 09/17/2017. Stroke and COPD; Risk Factors:Hypertension,                 Diabetes, Dyslipidemia and Former Smoker. Asthma. Chronic kidney                 disease. Sepsis secondary to UTI. Acute metabolic  encephalopathy.  Sonographer:    Darlina Sicilian RDCS Referring Phys: [id number] [place]  1. Left ventricular ejection fraction, by estimation, is 55 to 60%. The left ventricle has normal function. The left ventricle has no regional wall motion abnormalities. Left ventricular diastolic function could not be evaluated.  2. Right ventricular systolic function was not well visualized. The right ventricular size is not well visualized. There is normal pulmonary artery systolic pressure.  3. A small pericardial effusion is present.  4. The mitral valve is normal in structure. Trivial mitral valve regurgitation. No evidence of mitral stenosis. Moderate mitral annular calcification.  5. The aortic valve is tricuspid. There is mild calcification of the aortic valve. There is mild thickening of the aortic valve. Aortic valve regurgitation is not visualized. Mild aortic valve sclerosis is present, with no evidence of aortic valve stenosis.  6. The inferior vena cava is normal in size with <50% respiratory variability, suggesting right atrial pressure of 8 mmHg. Comparison(s): No significant change from prior study. Conclusion(s)/Recommendation(s): Otherwise normal echocardiogram, with minor abnormalities described in the report. FINDINGS  Left Ventricle: Left ventricular ejection  fraction, by estimation, is 55 to 60%. The left ventricle has normal function. The left ventricle has no regional wall motion abnormalities. Definity contrast agent was given IV to delineate the left ventricular  endocardial borders. The left ventricular internal cavity size was normal in size. There is no left ventricular hypertrophy. Left ventricular diastolic function could not be evaluated due to atrial fibrillation. Left ventricular diastolic function could  not be evaluated. Right Ventricle: The right ventricular size is not well visualized. Right vetricular wall thickness was not well visualized. Right ventricular systolic function was not well visualized. There is normal pulmonary artery systolic pressure. The tricuspid regurgitant velocity is 2.36 m/s, and with an assumed right atrial pressure of 8 mmHg, the estimated right ventricular systolic pressure is 22.0 mmHg. Left Atrium: Left atrial size was not well visualized. Right Atrium: Right atrial size was not well visualized. Pericardium: A small pericardial effusion is present. Mitral Valve: The mitral valve is normal in structure. There is mild thickening of the mitral valve leaflet(s). There is moderate calcification of the mitral valve leaflet(s). Moderate mitral annular calcification. Trivial mitral valve regurgitation. No evidence of mitral valve stenosis. Tricuspid Valve: The tricuspid valve is normal in structure. Tricuspid valve regurgitation is mild . No evidence of tricuspid stenosis. Aortic Valve: The aortic valve is tricuspid. There is mild calcification of the aortic valve. There is mild thickening of the aortic valve. There is mild to moderate aortic valve annular calcification. Aortic valve regurgitation is not visualized. Mild aortic valve sclerosis is present, with no evidence of aortic valve stenosis. Pulmonic Valve: The pulmonic valve was grossly normal. Pulmonic valve regurgitation is not visualized. No evidence of pulmonic stenosis.  Aorta: The aortic root, ascending aorta and aortic arch are all structurally normal, with no evidence of dilitation or obstruction. Venous: The inferior vena cava is normal in size with less than 50% respiratory variability, suggesting right atrial pressure of 8 mmHg. IAS/Shunts: The atrial septum is grossly normal.  LEFT VENTRICLE PLAX 2D LVIDd:         4.60 cm LVIDs:         3.20 cm LV PW:         0.80 cm LV IVS:        0.90 cm LVOT diam:     2.00 cm LV SV:  46 LV SV Index:   24 LVOT Area:     3.14 cm  LV Volumes (MOD) LV vol d, MOD A2C: 58.0 ml LV vol d, MOD A4C: 52.6 ml LV vol s, MOD A2C: 21.8 ml LV vol s, MOD A4C: 22.0 ml LV SV MOD A2C:     36.2 ml LV SV MOD A4C:     52.6 ml LV SV MOD BP:      34.0 ml RIGHT VENTRICLE RV S prime:     7.18 cm/s LEFT ATRIUM             Index       RIGHT ATRIUM           Index LA diam:        3.90 cm 2.01 cm/m  RA Area:     13.60 cm LA Vol (A2C):   34.6 ml 17.85 ml/m RA Volume:   29.00 ml  14.96 ml/m LA Vol (A4C):   37.2 ml 19.19 ml/m LA Biplane Vol: 36.9 ml 19.04 ml/m  AORTIC VALVE LVOT Vmax:   81.60 cm/s LVOT Vmean:  60.000 cm/s LVOT VTI:    0.148 m  AORTA Ao Root diam: 3.20 cm Ao Asc diam:  3.40 cm MITRAL VALVE               TRICUSPID VALVE MV Area (PHT): 4.31 cm    TR Peak grad:   22.3 mmHg MV Decel Time: 176 msec    TR Vmax:        236.00 cm/s MV E velocity: 96.53 cm/s                            SHUNTS                            Systemic VTI:  0.15 m                            Systemic Diam: 2.00 cm Buford Dresser MD Electronically signed by Buford Dresser MD Signature Date/Time: 07/31/2020/2:52:10 PM    Final    DG Hip Unilat With Pelvis 2-3 Views Right  Result Date: 07/11/2020 CLINICAL DATA:  Right hip pain after a fall. EXAM: DG HIP (WITH OR WITHOUT PELVIS) 2-3V RIGHT COMPARISON:  Hip radiographs dated 07/09/2020. FINDINGS: There is no evidence of hip fracture or dislocation. Mild degenerative changes are seen in both hips. IMPRESSION: No  acute osseous injury. Electronically Signed   By: Zerita Boers M.D.   On: 07/11/2020 11:56   DG Hip Unilat W or Wo Pelvis 2-3 Views Right  Result Date: 07/09/2020 CLINICAL DATA:  Fall on the way to the bathroom.  Right hip pain. EXAM: DG HIP (WITH OR WITHOUT PELVIS) 2-3V RIGHT COMPARISON:  None. FINDINGS: The cortical margins of the bony pelvis and right hip are intact. No fracture. Pubic symphysis and sacroiliac joints are congruent. Both femoral heads are well-seated in the respective acetabula. Minimal degenerative spurring of the acetabulum. There may be soft tissue edema laterally versus habitus. IMPRESSION: No fracture of the pelvis or right hip. Electronically Signed   By: Keith Rake M.D.   On: 07/09/2020 17:40   DG Femur Min 2 Views Right  Result Date: 07/09/2020 CLINICAL DATA:  Recent fall with right leg pain, initial encounter EXAM: RIGHT FEMUR 2 VIEWS COMPARISON:  None. FINDINGS: Degenerative changes  about the knee joint are noted most prominent medially. No acute fracture or dislocation is noted. No joint effusion is seen. No gross soft tissue abnormality is noted. IMPRESSION: Degenerative change without acute abnormality. Electronically Signed   By: Inez Catalina M.D.   On: 07/09/2020 18:29

## 2020-08-02 NOTE — TOC Progression Note (Signed)
Transition of Care [facility]) - Progression Note    Patient Details  Name: Dawn Morrow MRN: [id number] Date of Birth: Aug 06, 1936  Transition of Care [facility]) CM/SW [place], LCSW Phone Number: 08/02/2020, 3:14 PM  Clinical Narrative:    11am-CSW spoke with patient's niece, [name], and her spouse, Rush Landmark. Rush Landmark will go to [facility] today to complete paperwork for patient to return to [facility] on Saturday. MD ordering a COVID test.   3pm-Paperwork has been completed at [facility].   Expected Discharge Plan: Skilled Nursing Facility Barriers to Discharge: Continued Medical Work up, Ship broker  Expected Discharge Plan and Services Expected Discharge Plan: [place] In-house Referral: Clinical Social Work                                             Social Determinants of Health (SDOH) Interventions    Readmission Risk Interventions Readmission Risk Prevention Plan 10/03/2019  Transportation Screening Complete  PCP or Specialist Appt within 3-5 Days Complete  HRI or [place] Not Complete  HRI or Home Care Consult comments Patient at her baseline  Social Work Consult for [place] Planning/Counseling [place] Not Applicable  Medication Review Press photographer) Referral to Pharmacy  Some recent data might be hidden

## 2020-08-03 DIAGNOSIS — N390 Urinary tract infection, site not specified: Secondary | ICD-10-CM | POA: Diagnosis not present

## 2020-08-03 DIAGNOSIS — G9340 Encephalopathy, unspecified: Secondary | ICD-10-CM

## 2020-08-03 DIAGNOSIS — A419 Sepsis, unspecified organism: Secondary | ICD-10-CM | POA: Diagnosis not present

## 2020-08-03 DIAGNOSIS — G9341 Metabolic encephalopathy: Secondary | ICD-10-CM | POA: Diagnosis not present

## 2020-08-03 DIAGNOSIS — N179 Acute kidney failure, unspecified: Secondary | ICD-10-CM | POA: Diagnosis not present

## 2020-08-03 DIAGNOSIS — R6520 Severe sepsis without septic shock: Secondary | ICD-10-CM

## 2020-08-03 LAB — GLUCOSE, CAPILLARY
Glucose-Capillary: 102 mg/dL — ABNORMAL HIGH (ref 70–99)
Glucose-Capillary: 161 mg/dL — ABNORMAL HIGH (ref 70–99)

## 2020-08-03 MED ORDER — FOSFOMYCIN TROMETHAMINE 3 G PO PACK
3.0000 g | PACK | Freq: Once | ORAL | Status: AC
  Administered 2020-08-03: 3 g via ORAL
  Filled 2020-08-03: qty 3

## 2020-08-03 MED ORDER — RIVAROXABAN 15 MG PO TABS
15.0000 mg | ORAL_TABLET | Freq: Every day | ORAL | Status: DC
Start: 2020-08-03 — End: 2020-08-14

## 2020-08-03 MED ORDER — LEVOFLOXACIN 500 MG PO TABS: 500.0000 mg | ORAL_TABLET | Freq: Every day | ORAL | 0 refills | Status: AC

## 2020-08-03 NOTE — TOC Transition Note (Signed)
Transition of Care [facility]) - CM/SW Discharge Note   Patient Details  Name: Dawn Morrow MRN: [id number] Date of Birth: [birth year]  Transition of Care [facility]) CM/SW Contact:  Gabrielle Dare Phone Number: 08/03/2020, 11:12 AM   Clinical Narrative:    Patient will Discharge To:[facility] Anticipated DC Date:08/03/20 Family Notified:yes, Ferdinand Cava, [place], [phone] Transport MS:[id number]   Per MD patient ready for DC to [facility] . RN, patient, patient's family, and facility notified of DC. Assessment, Fl2/Pasrr, and Discharge Summary sent to facility. RN given number for report [phone] Room # 211),   DC packet on chart. Ambulance transport requested for patient for 1:00pm  CSW signing off.  Reed Breech LCSWA [place], [place]   Final next level of care: [place] Barriers to Discharge: No Barriers Identified   Patient Goals and CMS Choice        Discharge Placement              Patient chooses bed at: [facility] Patient to be transferred to facility by: [place] Name of family member notified: Ferdinand Cava Patient and family notified of of transfer: 08/03/20  Discharge Plan and Services In-house Referral: Clinical Social Work                                   Social Determinants of Health (SDOH) Interventions     Readmission Risk Interventions Readmission Risk Prevention Plan 10/03/2019  Transportation Screening Complete  PCP or Specialist Appt within 3-5 Days Complete  HRI or [place] Not Complete  HRI or Home Care Consult comments Patient at her baseline  Social Work Consult for [place] Planning/Counseling [place] Not Applicable  Medication Review Press photographer) Referral to Pharmacy  Some recent data might be hidden

## 2020-08-03 NOTE — Discharge Summary (Signed)
PATIENT DETAILS Name: Dawn Morrow Age: 84 y.o. Sex: female Date of Birth: 20-Aug-1936 MRN: [id number]. Admitting Physician: Shela Leff, MD SFK:CLEXNT, Denton Ar, MD  Admit Date: 07/30/2020 Discharge date: 08/03/2020  Recommendations for Outpatient Follow-up:  1. Follow up with PCP in 1-2 weeks 2. Please obtain CMP/CBC in one week 3. Please consider palliative care evaluation at SNF.  Admitted From:  SNF   Disposition: SNF    Home Health: No  Equipment/Devices: None  Discharge Condition: Stable  CODE STATUS: DNR  Diet recommendation:  Diet Order            Diet - low sodium heart healthy           Diet Carb Modified           DIET SOFT Room service appropriate? No; Fluid consistency: Thin  Diet effective now                  Brief Summary: Brief Narrative - Dawn Morrow a 84 y.o.femalewith medical history significant ofdementia, frequent falls, anxiety and depression, asthma, COPD, chronic hypoxic and hypercapnic respiratory failure on 3 L home oxygen, diabetes, history of DVT and PE, persistent A. fib on Xarelto, stroke, hypertension, hyperlipidemia, hypothyroidism, CKD stage IIIbpresenting the ED via EMS from her nursing home for evaluation of altered status, she has been bedbound for about 2 months and has had recurrent UTIs.  In the ER was found to have UTI and admitted to the hospital  Brief Hospital Course: Sepsis due to UTI and possible HCAP/aspiration pneumonia: Sepsis physiology has resolved-leukocytosis downtrending-urine culture positive for Proteus.She was treated with levofloxacin given numerous allergies-however urine culture is growing Proteus that seems to be resistant to fluoroquinolones.  Since she has clinically improved-not sure if she actually had a UTI or this was just a colonization.  In any event-we will give 1 dose of fosfomycin-she will continue with levofloxacin for 1 additional day-to complete 5-day course for presumed  aspiration pneumonia.  Acute metabolic encephalopathy: Secondary to above-currently pleasantly confused-but able to answer most of my questions appropriately.  CT head on admission was negative.  AKI on CKD stage IIIb: AKI hemodynamically mediated-creatinine close to baseline.  Hyperkalemia: Resolved  Chronic diastolic heart failure: Euvolemic on exam-follow.  Chronic atrial fibrillation: Rate controlled-on Coreg and Xarelto.  HTN: BP controlled-continue Coreg.  HLD: Continue Lipitor/Zetia  DM-2: CBG stable-continue Lantus 5 units daily and SSI.  Follow and adjust.  COPD with chronic hypoxic respiratory failure: No signs of exacerbation-continue bronchodilators.  Apparently on 2 L at all times.  Hypothyroidism: Continue Synthroid-TSH stable.  Anxiety/depression/dementia: Seems to be at baseline-continue Zoloft and Wellbutrin.   Obesity: Estimated body mass index is 31.11 kg/m as calculated from the following:   Height as of this encounter: 5' 5.5" (1.664 m).   Weight as of this encounter: 86.1 kg.   RN pressure injury documentation: Pressure Injury 07/31/20 Buttocks Right;Left;Mid Stage 2 -  Partial thickness loss of dermis presenting as a shallow open injury with a red, pink wound bed without slough. (Active)  07/31/20 0441  Location: Buttocks  Location Orientation: Right;Left;Mid  Staging: Stage 2 -  Partial thickness loss of dermis presenting as a shallow open injury with a red, pink wound bed without slough.  Wound Description (Comments):   Present on Admission:    Discharge Diagnoses:  Principal Problem:   UTI (urinary tract infection) Active Problems:   AKI (acute kidney injury) ([place])   Sepsis ([place])   Acute metabolic encephalopathy  CAP (community acquired pneumonia)   Pressure injury of skin   Discharge Instructions:  Activity:  As tolerated with Full fall precautions use walker/cane & assistance as needed  Discharge Instructions    Call MD  for:  difficulty breathing, headache or visual disturbances   Complete by: As directed    Diet - low sodium heart healthy   Complete by: As directed    Diet Carb Modified   Complete by: As directed    Discharge instructions   Complete by: As directed    Follow with Primary MD  Wenda Low, MD in 1-2 weeks  Please get a complete blood count and chemistry panel checked by your Primary MD at your next visit, and again as instructed by your Primary MD.  Get Medicines reviewed and adjusted: Please take all your medications with you for your next visit with your Primary MD  Laboratory/radiological data: Please request your Primary MD to go over all hospital tests and procedure/radiological results at the follow up, please ask your Primary MD to get all Hospital records sent to his/her office.  In some cases, they will be blood work, cultures and biopsy results pending at the time of your discharge. Please request that your primary care M.D. follows up on these results.  Also Note the following: If you experience worsening of your admission symptoms, develop shortness of breath, life threatening emergency, suicidal or homicidal thoughts you must seek medical attention immediately by calling 911 or calling your MD immediately  if symptoms less severe.  You must read complete instructions/literature along with all the possible adverse reactions/side effects for all the Medicines you take and that have been prescribed to you. Take any new Medicines after you have completely understood and accpet all the possible adverse reactions/side effects.   Do not drive when taking Pain medications or sleeping medications (Benzodaizepines)  Do not take more than prescribed Pain, Sleep and Anxiety Medications. It is not advisable to combine anxiety,sleep and pain medications without talking with your primary care practitioner  Special Instructions: If you have smoked or chewed Tobacco  in the last 2 yrs  please stop smoking, stop any regular Alcohol  and or any Recreational drug use.  Wear Seat belts while driving.  Please note: You were cared for by a hospitalist during your hospital stay. Once you are discharged, your primary care physician will handle any further medical issues. Please note that NO REFILLS for any discharge medications will be authorized once you are discharged, as it is imperative that you return to your primary care physician (or establish a relationship with a primary care physician if you do not have one) for your post hospital discharge needs so that they can reassess your need for medications and monitor your lab values.   Increase activity slowly   Complete by: As directed    No dressing needed   Complete by: As directed      Allergies as of 08/03/2020      Reactions   Penicillins Anaphylaxis   Codeine Other (See Comments)   Listed on MAR   Sulfonylureas Other (See Comments)   Listed on MAR   Sulfonamide Derivatives Other (See Comments)   Unknown reaction      Medication List    STOP taking these medications   LORazepam 0.5 MG tablet Commonly known as: ATIVAN     TAKE these medications   acetaminophen 500 MG tablet Commonly known as: TYLENOL Take 500 mg by mouth every 4 (four)  hours as needed (pain).   albuterol 108 (90 Base) MCG/ACT inhaler Commonly known as: VENTOLIN HFA Inhale 2 puffs into the lungs every 6 (six) hours as needed for wheezing or shortness of breath.   atorvastatin 10 MG tablet Commonly known as: LIPITOR Take 1 tablet (10 mg total) by mouth at bedtime.   Basaglar KwikPen 100 UNIT/ML Inject 5 Units into the skin at bedtime.   buPROPion 300 MG 24 hr tablet Commonly known as: WELLBUTRIN XL Take 1 tablet (300 mg total) by mouth daily.   carvedilol 12.5 MG tablet Commonly known as: COREG Take 1 tablet (12.5 mg total) by mouth 2 (two) times daily with a meal.   docusate sodium 100 MG capsule Commonly known as: COLACE Take  100 mg by mouth daily.   ezetimibe 10 MG tablet Commonly known as: ZETIA Take 10 mg by mouth daily.   gabapentin 300 MG capsule Commonly known as: NEURONTIN Take 1 cap in AM, 1 cap at noon, 2 caps at bedtime What changed:   how much to take  how to take this  when to take this  additional instructions   ketoconazole 2 % shampoo Commonly known as: NIZORAL Apply 1 application topically See admin instructions. Use topically to shampoo hair twice weekly - Tuesday and Friday   levofloxacin 500 MG tablet Commonly known as: Levaquin Take 1 tablet (500 mg total) by mouth daily for 1 dose. Start taking on: August 04, 2020   levothyroxine 88 MCG tablet Commonly known as: SYNTHROID Take 88 mcg by mouth daily before breakfast.   meclizine 25 MG tablet Commonly known as: ANTIVERT Take 25 mg by mouth at bedtime as needed for dizziness.   multivitamin with minerals Tabs tablet Take 1 tablet by mouth daily.   NovoLOG FlexPen 100 UNIT/ML FlexPen Generic drug: insulin aspart Inject 0-12 Units into the skin See admin instructions. Inject 0-12 units subcutaneously four times daily - before meals and at bedtime - per sliding scale: CBG 0-150 0 units, 151-200 4 units, 201-250 6 units, 251-300 8 units, 301-350 10 units, 351-400 12 units, >400 12 units   nystatin powder Generic drug: nystatin Apply 1 application topically 2 (two) times daily.   ondansetron 4 MG tablet Commonly known as: ZOFRAN Take 4 mg by mouth every 8 (eight) hours as needed for nausea or vomiting.   OXYGEN Inhale 3 L into the lungs continuous.   polyethylene glycol 17 g packet Commonly known as: MIRALAX / GLYCOLAX Take 17 g by mouth daily as needed for mild constipation or moderate constipation.   Rivaroxaban 15 MG Tabs tablet Commonly known as: Xarelto Take 1 tablet (15 mg total) by mouth daily with supper. What changed:   medication strength  how much to take   sertraline 100 MG tablet Commonly  known as: ZOLOFT Take 1 tablet (100 mg total) by mouth daily.   Systane Balance 0.6 % Soln Generic drug: Propylene Glycol Place 1 drop into both eyes 4 (four) times daily as needed (for dry eyes).   Vitamin D 50 MCG (2000 UT) tablet Take 2,000 Units by mouth daily.            Discharge Care Instructions  (From admission, onward)         Start     Ordered   08/03/20 0000  No dressing needed        08/03/20 1012          Allergies  Allergen Reactions  . Penicillins Anaphylaxis  .  Codeine Other (See Comments)    Listed on MAR  . Sulfonylureas Other (See Comments)    Listed on MAR  . Sulfonamide Derivatives Other (See Comments)    Unknown reaction    Other Procedures/Studies: DG Chest 1 View  Result Date: 07/09/2020 CLINICAL DATA:  Fall. Additional history provided: Former smoker, COPD, hypertension, history of tubular adenoma of colon 2006, history of asthma. EXAM: CHEST  1 VIEW COMPARISON:  Prior chest radiographs 04/27/2020. FINDINGS: Shallow inspiration radiograph. Cardiomegaly. Aortic atherosclerosis. Chronic prominence of the interstitial lung markings. No appreciable airspace consolidation. No evidence of pleural effusion or pneumothorax. No acute bony abnormality identified. Partially imaged metallic foreign body projecting over the upper lumbar spine, appearing most suggestive of an IVC filter. IMPRESSION: Shallow inspiration radiograph. No evidence of acute cardiopulmonary abnormality. Chronic prominence of the interstitial lung markings. Unchanged cardiomegaly. Aortic Atherosclerosis (ICD10-I70.0). Electronically Signed   By: Kellie Simmering DO   On: 07/09/2020 17:43   CT HEAD WO CONTRAST  Result Date: 07/31/2020 CLINICAL DATA:  Mental status change of unknown cause. History of hypertension and diabetes. EXAM: CT HEAD WITHOUT CONTRAST TECHNIQUE: Contiguous axial images were obtained from the base of the skull through the vertex without intravenous contrast.  COMPARISON:  07/16/2020 FINDINGS: Brain: Mild cerebral atrophy. Mild ventricular dilatation consistent with central atrophy. Prominent low-attenuation changes throughout the deep white matter consistent with small vessel ischemia. No mass-effect or midline shift. No abnormal extra-axial fluid collections. The gray-white matter junctions are distinct. Basal cisterns are not effaced. No acute intracranial hemorrhage. Vascular: Moderate intracranial arterial vascular calcifications. Skull: The calvarium appears intact. No acute depressed skull fractures. Sinuses/Orbits: Paranasal sinuses and mastoid air cells are clear. Other: Dense subcutaneous soft tissue nodule over the left parietal convexity likely representing a pilonidal cyst. IMPRESSION: 1. No acute intracranial abnormalities. 2. Chronic atrophy and small vessel ischemia. Electronically Signed   By: Lucienne Capers M.D.   On: 07/31/2020 00:45   CT HEAD WO CONTRAST  Result Date: 07/16/2020 CLINICAL DATA:  84 year old female with altered mental status. Neurologic deficit. EXAM: CT HEAD WITHOUT CONTRAST TECHNIQUE: Contiguous axial images were obtained from the base of the skull through the vertex without intravenous contrast. COMPARISON:  Head CT 07/11/2020.  Brain MRI 12/16/2019. FINDINGS: Brain: Mild motion artifact today. No midline shift, mass effect, or evidence of intracranial mass lesion. Stable ventricle size and configuration. No ventriculomegaly. No acute intracranial hemorrhage identified. Advanced bilateral cerebral white matter hypodensity and including involvement of the deep white matter capsules. This appears similar to the prior MRI. Associated chronic heterogeneity of the bilateral deep gray nuclei. Stable gray-white matter differentiation throughout the brain. No cortically based acute infarct identified. Vascular: Calcified atherosclerosis at the skull base. No suspicious intracranial vascular hyperdensity. Skull: Mild motion artifact. No  acute osseous abnormality identified. Sinuses/Orbits: Stable mild left mastoid effusion. Possible small volume retained secretions in the nasopharynx. Left tympanic cavity, other paranasal sinuses and mastoids remain clear. Other: Stable orbit and scalp soft tissues. IMPRESSION: 1. Mild motion artifact today. No acute or evolving infarct identified. Underlying severe chronic small vessel disease. 2. Stable mild left mastoid effusion, likely postinflammatory. Electronically Signed   By: Genevie Ann M.D.   On: 07/16/2020 16:57   CT Head Wo Contrast  Result Date: 07/11/2020 CLINICAL DATA:  Unwitnessed fall. EXAM: CT HEAD WITHOUT CONTRAST TECHNIQUE: Contiguous axial images were obtained from the base of the skull through the vertex without intravenous contrast. COMPARISON:  July 09, 2020. FINDINGS: Brain: Mild chronic ischemic white  matter disease is noted. No mass effect or midline shift is noted. Ventricular size is within normal limits. There is no evidence of mass lesion, hemorrhage or acute infarction. Vascular: No hyperdense vessel or unexpected calcification. Skull: Normal. Negative for fracture or focal lesion. Sinuses/Orbits: No acute finding. Other: None. IMPRESSION: Mild chronic ischemic white matter disease. No acute intracranial abnormality seen. Electronically Signed   By: Marijo Conception M.D.   On: 07/11/2020 13:47   CT Head Wo Contrast  Result Date: 07/09/2020 CLINICAL DATA:  [place] EXAM: CT HEAD WITHOUT CONTRAST TECHNIQUE: Contiguous axial images were obtained from the base of the skull through the vertex without intravenous contrast. COMPARISON:  04/27/2020 FINDINGS: Brain: Chronic hypodensities within the periventricular white matter and bilateral basal ganglia are stable, consistent with chronic small vessel ischemic changes. No acute infarct or hemorrhage. Lateral ventricles and remaining midline structures are unremarkable. No acute extra-axial fluid collections. No mass effect. Vascular: No  hyperdense vessel or unexpected calcification. Skull: Normal. Negative for fracture or focal lesion. Minimal residual left frontal scalp hematoma, markedly decreased since previous exam. Sinuses/Orbits: No acute finding. Other: None. IMPRESSION: 1. No acute intracranial process. 2. Stable chronic small-vessel ischemic changes throughout the white matter and bilateral basal ganglia. Electronically Signed   By: Randa Ngo M.D.   On: 07/09/2020 17:37   CT Hip Right Wo Contrast  Result Date: 07/11/2020 CLINICAL DATA:  Right hip pain after fall. EXAM: CT OF THE RIGHT HIP WITHOUT CONTRAST TECHNIQUE: Multidetector CT imaging of the right hip was performed according to the standard protocol. Multiplanar CT image reconstructions were also generated. COMPARISON:  CT right hip dated July 09, 2020. FINDINGS: Bones/Joint/Cartilage No fracture or dislocation. Unchanged mild right hip osteoarthritis. No joint effusion. Ligaments Ligaments are suboptimally evaluated by CT. Muscles and Tendons Grossly intact. Soft tissue No fluid collection or hematoma. No soft tissue mass. Sigmoid colonic diverticulosis. IMPRESSION: 1. No acute osseous abnormality. Electronically Signed   By: Titus Dubin M.D.   On: 07/11/2020 13:40   CT Hip Right Wo Contrast  Result Date: 07/09/2020 CLINICAL DATA:  Fall in the way to the bathroom. Right hip/leg pain. EXAM: CT OF THE RIGHT HIP WITHOUT CONTRAST TECHNIQUE: Multidetector CT imaging of the right hip was performed according to the standard protocol. Multiplanar CT image reconstructions were also generated. COMPARISON:  Radiograph earlier today. FINDINGS: Bones/Joint/Cartilage No acute fracture. No dislocation. Right hip osteoarthritis with joint space narrowing and acetabular spurring. No avascular necrosis. No focal bone lesion. Degenerative change of the pubic symphysis with osteophytes. No significant hip joint effusion. Ligaments Suboptimally assessed by CT. Muscles and Tendons No  intramuscular hematoma.  Muscle bulk is maintained. Soft tissues Mild skin thickening and soft tissue edema laterally. No confluent soft tissue hematoma. Diverticulosis in the sigmoid colon partially included. IMPRESSION: 1. No acute fracture or dislocation of the right hip. 2. Right hip osteoarthritis. Electronically Signed   By: Keith Rake M.D.   On: 07/09/2020 18:55   DG Chest Port 1 View  Result Date: 07/30/2020 CLINICAL DATA:  Less responsive EXAM: PORTABLE CHEST 1 VIEW COMPARISON:  07/09/2020 FINDINGS: Mild cardiomegaly with central vascular congestion. Small left-sided pleural effusion. Left basilar consolidation. Aortic atherosclerosis. No pneumothorax. IMPRESSION: Mild cardiomegaly with central vascular congestion and small left pleural effusion. Left basilar consolidation. Electronically Signed   By: Donavan Foil M.D.   On: 07/30/2020 23:49   ECHOCARDIOGRAM COMPLETE  Result Date: 07/31/2020    ECHOCARDIOGRAM REPORT   Patient Name:   Dawn Morrow  A Napierkowski Date of Exam: 07/31/2020 Medical Rec #:  [id number]    Height:       65.5 in Accession #:    [id number]   Weight:       188.1 lb Date of Birth:  1936/01/07    BSA:          1.938 m Patient Age:    70 years     BP:           117/67 mmHg Patient Gender: F            HR:           86 bpm. Exam Location:  Inpatient Procedure: 2D Echo, Color Doppler, Cardiac Doppler and Intracardiac            Opacification Agent Indications:    CHF-Acute Diastolic 952.84 / X32.44  History:        Patient has prior history of Echocardiogram examinations, most                 recent 09/17/2017. Stroke and COPD; Risk Factors:Hypertension,                 Diabetes, Dyslipidemia and Former Smoker. Asthma. Chronic kidney                 disease. Sepsis secondary to UTI. Acute metabolic                 encephalopathy.  Sonographer:    Darlina Sicilian RDCS Referring Phys: [id number] [place]  1. Left ventricular ejection fraction, by estimation, is 55 to 60%.  The left ventricle has normal function. The left ventricle has no regional wall motion abnormalities. Left ventricular diastolic function could not be evaluated.  2. Right ventricular systolic function was not well visualized. The right ventricular size is not well visualized. There is normal pulmonary artery systolic pressure.  3. A small pericardial effusion is present.  4. The mitral valve is normal in structure. Trivial mitral valve regurgitation. No evidence of mitral stenosis. Moderate mitral annular calcification.  5. The aortic valve is tricuspid. There is mild calcification of the aortic valve. There is mild thickening of the aortic valve. Aortic valve regurgitation is not visualized. Mild aortic valve sclerosis is present, with no evidence of aortic valve stenosis.  6. The inferior vena cava is normal in size with <50% respiratory variability, suggesting right atrial pressure of 8 mmHg. Comparison(s): No significant change from prior study. Conclusion(s)/Recommendation(s): Otherwise normal echocardiogram, with minor abnormalities described in the report. FINDINGS  Left Ventricle: Left ventricular ejection fraction, by estimation, is 55 to 60%. The left ventricle has normal function. The left ventricle has no regional wall motion abnormalities. Definity contrast agent was given IV to delineate the left ventricular  endocardial borders. The left ventricular internal cavity size was normal in size. There is no left ventricular hypertrophy. Left ventricular diastolic function could not be evaluated due to atrial fibrillation. Left ventricular diastolic function could  not be evaluated. Right Ventricle: The right ventricular size is not well visualized. Right vetricular wall thickness was not well visualized. Right ventricular systolic function was not well visualized. There is normal pulmonary artery systolic pressure. The tricuspid regurgitant velocity is 2.36 m/s, and with an assumed right atrial pressure of  8 mmHg, the estimated right ventricular systolic pressure is 36.6 mmHg. Left Atrium: Left atrial size was not well visualized. Right Atrium: Right atrial size was not well visualized. Pericardium: A small pericardial effusion is  present. Mitral Valve: The mitral valve is normal in structure. There is mild thickening of the mitral valve leaflet(s). There is moderate calcification of the mitral valve leaflet(s). Moderate mitral annular calcification. Trivial mitral valve regurgitation. No evidence of mitral valve stenosis. Tricuspid Valve: The tricuspid valve is normal in structure. Tricuspid valve regurgitation is mild . No evidence of tricuspid stenosis. Aortic Valve: The aortic valve is tricuspid. There is mild calcification of the aortic valve. There is mild thickening of the aortic valve. There is mild to moderate aortic valve annular calcification. Aortic valve regurgitation is not visualized. Mild aortic valve sclerosis is present, with no evidence of aortic valve stenosis. Pulmonic Valve: The pulmonic valve was grossly normal. Pulmonic valve regurgitation is not visualized. No evidence of pulmonic stenosis. Aorta: The aortic root, ascending aorta and aortic arch are all structurally normal, with no evidence of dilitation or obstruction. Venous: The inferior vena cava is normal in size with less than 50% respiratory variability, suggesting right atrial pressure of 8 mmHg. IAS/Shunts: The atrial septum is grossly normal.  LEFT VENTRICLE PLAX 2D LVIDd:         4.60 cm LVIDs:         3.20 cm LV PW:         0.80 cm LV IVS:        0.90 cm LVOT diam:     2.00 cm LV SV:         46 LV SV Index:   24 LVOT Area:     3.14 cm  LV Volumes (MOD) LV vol d, MOD A2C: 58.0 ml LV vol d, MOD A4C: 52.6 ml LV vol s, MOD A2C: 21.8 ml LV vol s, MOD A4C: 22.0 ml LV SV MOD A2C:     36.2 ml LV SV MOD A4C:     52.6 ml LV SV MOD BP:      34.0 ml RIGHT VENTRICLE RV S prime:     7.18 cm/s LEFT ATRIUM             Index       RIGHT ATRIUM            Index LA diam:        3.90 cm 2.01 cm/m  RA Area:     13.60 cm LA Vol (A2C):   34.6 ml 17.85 ml/m RA Volume:   29.00 ml  14.96 ml/m LA Vol (A4C):   37.2 ml 19.19 ml/m LA Biplane Vol: 36.9 ml 19.04 ml/m  AORTIC VALVE LVOT Vmax:   81.60 cm/s LVOT Vmean:  60.000 cm/s LVOT VTI:    0.148 m  AORTA Ao Root diam: 3.20 cm Ao Asc diam:  3.40 cm MITRAL VALVE               TRICUSPID VALVE MV Area (PHT): 4.31 cm    TR Peak grad:   22.3 mmHg MV Decel Time: 176 msec    TR Vmax:        236.00 cm/s MV E velocity: 96.53 cm/s                            SHUNTS                            Systemic VTI:  0.15 m  Systemic Diam: 2.00 cm Buford Dresser MD Electronically signed by Buford Dresser MD Signature Date/Time: 07/31/2020/2:52:10 PM    Final    DG Hip Unilat With Pelvis 2-3 Views Right  Result Date: 07/11/2020 CLINICAL DATA:  Right hip pain after a fall. EXAM: DG HIP (WITH OR WITHOUT PELVIS) 2-3V RIGHT COMPARISON:  Hip radiographs dated 07/09/2020. FINDINGS: There is no evidence of hip fracture or dislocation. Mild degenerative changes are seen in both hips. IMPRESSION: No acute osseous injury. Electronically Signed   By: Zerita Boers M.D.   On: 07/11/2020 11:56   DG Hip Unilat W or Wo Pelvis 2-3 Views Right  Result Date: 07/09/2020 CLINICAL DATA:  Fall on the way to the bathroom.  Right hip pain. EXAM: DG HIP (WITH OR WITHOUT PELVIS) 2-3V RIGHT COMPARISON:  None. FINDINGS: The cortical margins of the bony pelvis and right hip are intact. No fracture. Pubic symphysis and sacroiliac joints are congruent. Both femoral heads are well-seated in the respective acetabula. Minimal degenerative spurring of the acetabulum. There may be soft tissue edema laterally versus habitus. IMPRESSION: No fracture of the pelvis or right hip. Electronically Signed   By: Keith Rake M.D.   On: 07/09/2020 17:40   DG Femur Min 2 Views Right  Result Date: 07/09/2020 CLINICAL DATA:  Recent  fall with right leg pain, initial encounter EXAM: RIGHT FEMUR 2 VIEWS COMPARISON:  None. FINDINGS: Degenerative changes about the knee joint are noted most prominent medially. No acute fracture or dislocation is noted. No joint effusion is seen. No gross soft tissue abnormality is noted. IMPRESSION: Degenerative change without acute abnormality. Electronically Signed   By: Inez Catalina M.D.   On: 07/09/2020 18:29     TODAY-DAY OF DISCHARGE:  Subjective:   Dawn Morrow today has no headache,no chest abdominal pain,no new weakness tingling or numbness, feels much better wants to go home today.   Objective:   Blood pressure 120/80, pulse 77, temperature 97.8 F (36.6 C), temperature source Axillary, resp. rate 15, height 5' 5.5" (1.664 m), weight 86.1 kg, SpO2 95 %.  Intake/Output Summary (Last 24 hours) at 08/03/2020 1012 Last data filed at 08/03/2020 0544 Gross per 24 hour  Intake --  Output 1600 ml  Net -1600 ml   Filed Weights   08/01/20 0534 08/02/20 0327 08/03/20 0540  Weight: 85.1 kg 86.1 kg 86.1 kg    Exam: Awake Alert, Oriented *3, No new F.N deficits, Normal affect .AT,PERRAL Supple Neck,No JVD, No cervical lymphadenopathy appriciated.  Symmetrical Chest wall movement, Good air movement bilaterally, CTAB RRR,No Gallops,Rubs or new Murmurs, No Parasternal Heave +ve B.Sounds, Abd Soft, Non tender, No organomegaly appriciated, No rebound -guarding or rigidity. No Cyanosis, Clubbing or edema, No new Rash or bruise   PERTINENT RADIOLOGIC STUDIES: No results found.   PERTINENT LAB RESULTS: CBC: Recent Labs    08/01/20 0149 08/02/20 0229  WBC 25.7* 17.7*  HGB 11.1* 10.5*  HCT 36.7 34.8*  PLT 237 275   CMET CMP     Component Value Date/Time   NA 138 08/02/2020 0229   NA 145 06/14/2017 0000   K 3.1 (L) 08/02/2020 0229   CL 98 08/02/2020 0229   CO2 27 08/02/2020 0229   GLUCOSE 93 08/02/2020 0229   BUN 27 (H) 08/02/2020 0229   BUN 21 06/14/2017 0000    CREATININE 1.34 (H) 08/02/2020 0229   CALCIUM 8.1 (L) 08/02/2020 0229   PROT 5.3 (L) 08/02/2020 0229   ALBUMIN 2.2 (L) 08/02/2020 4627  AST 15 08/02/2020 0229   ALT 19 08/02/2020 0229   ALKPHOS 66 08/02/2020 0229   BILITOT 0.4 08/02/2020 0229   GFRNONAA 40 (L) 08/02/2020 0229   GFRAA 55 (L) 05/02/2020 0253    GFR Estimated Creatinine Clearance: 35.5 mL/min (A) (by C-G formula based on SCr of 1.34 mg/dL (H)). No results for input(s): LIPASE, AMYLASE in the last 72 hours. No results for input(s): CKTOTAL, CKMB, CKMBINDEX, TROPONINI in the last 72 hours. Invalid input(s): POCBNP No results for input(s): DDIMER in the last 72 hours. No results for input(s): HGBA1C in the last 72 hours. No results for input(s): CHOL, HDL, LDLCALC, TRIG, CHOLHDL, LDLDIRECT in the last 72 hours. No results for input(s): TSH, T4TOTAL, T3FREE, THYROIDAB in the last 72 hours.  Invalid input(s): FREET3 No results for input(s): VITAMINB12, FOLATE, FERRITIN, TIBC, IRON, RETICCTPCT in the last 72 hours. Coags: No results for input(s): INR in the last 72 hours.  Invalid input(s): PT Microbiology: Recent Results (from the past 240 hour(s))  Blood Cultures (routine x 2)     Status: None (Preliminary result)   Collection Time: 07/30/20 11:34 PM   Specimen: BLOOD  Result Value Ref Range Status   Specimen Description BLOOD RIGHT ANTECUBITAL  Final   Special Requests   Final    BOTTLES DRAWN AEROBIC AND ANAEROBIC Blood Culture adequate volume   Culture   Final    NO GROWTH 3 DAYS Performed at [facility], [address] [place], [place] [address]    Report Status PENDING  Incomplete  Respiratory Panel by RT PCR (Flu A&B, Covid) - Nasopharyngeal Swab     Status: None   Collection Time: 07/30/20 11:44 PM   Specimen: Nasopharyngeal Swab; Nasopharyngeal(NP) swabs in vial transport medium  Result Value Ref Range Status   SARS Coronavirus 2 by RT PCR NEGATIVE NEGATIVE Final    Comment: (NOTE) SARS-CoV-2  target nucleic acids are NOT DETECTED.  The SARS-CoV-2 RNA is generally detectable in upper respiratoy specimens during the acute phase of infection. The lowest concentration of SARS-CoV-2 viral copies this assay can detect is 131 copies/mL. A negative result does not preclude SARS-Cov-2 infection and should not be used as the sole basis for treatment or other patient management decisions. A negative result may occur with  improper specimen collection/handling, submission of specimen other than nasopharyngeal swab, presence of viral mutation(s) within the areas targeted by this assay, and inadequate number of viral copies (<131 copies/mL). A negative result must be combined with clinical observations, patient history, and epidemiological information. The expected result is Negative.  Fact Sheet for Patients:  PinkCheek.be  Fact Sheet for Healthcare Providers:  GravelBags.it  This test is no t yet approved or cleared by the [place] FDA and  has been authorized for detection and/or diagnosis of SARS-CoV-2 by FDA under an Emergency Use Authorization (EUA). This EUA will remain  in effect (meaning this test can be used) for the duration of the COVID-19 declaration under Section 564(b)(1) of the Act, 21 U.S.C. section 360bbb-3(b)(1), unless the authorization is terminated or revoked sooner.     Influenza A by PCR NEGATIVE NEGATIVE Final   Influenza B by PCR NEGATIVE NEGATIVE Final    Comment: (NOTE) The Xpert Xpress SARS-CoV-2/FLU/RSV assay is intended as an aid in  the diagnosis of influenza from Nasopharyngeal swab specimens and  should not be used as a sole basis for treatment. Nasal washings and  aspirates are unacceptable for Xpert Xpress SARS-CoV-2/FLU/RSV  testing.  Fact Sheet for Patients: PinkCheek.be  Fact Sheet for Healthcare  Providers: GravelBags.it  This test is not yet approved or cleared by the [place] FDA and  has been authorized for detection and/or diagnosis of SARS-CoV-2 by  FDA under an Emergency Use Authorization (EUA). This EUA will remain  in effect (meaning this test can be used) for the duration of the  Covid-19 declaration under Section 564(b)(1) of the Act, 21  U.S.C. section 360bbb-3(b)(1), unless the authorization is  terminated or revoked. Performed at [facility], [place] [address], [place], [place] [address]   Urine culture     Status: Abnormal   Collection Time: 07/31/20  1:12 AM   Specimen: Urine, Random  Result Value Ref Range Status   Specimen Description URINE, RANDOM  Final   Special Requests   Final    NONE Performed at [facility], [place] [address] [place], [place] [address]    Culture >=100,000 COLONIES/mL PROTEUS MIRABILIS (A)  Final   Report Status 08/02/2020 FINAL  Final   Organism ID, Bacteria PROTEUS MIRABILIS (A)  Final      Susceptibility   Proteus mirabilis - MIC*    AMPICILLIN <=2 SENSITIVE Sensitive     CEFAZOLIN 8 SENSITIVE Sensitive     CEFEPIME <=0.12 SENSITIVE Sensitive     CEFTRIAXONE <=0.25 SENSITIVE Sensitive     CIPROFLOXACIN >=4 RESISTANT Resistant     GENTAMICIN <=1 SENSITIVE Sensitive     IMIPENEM 2 SENSITIVE Sensitive     NITROFURANTOIN 128 RESISTANT Resistant     TRIMETH/SULFA >=320 RESISTANT Resistant     AMPICILLIN/SULBACTAM <=2 SENSITIVE Sensitive     PIP/TAZO <=4 SENSITIVE Sensitive     * >=100,000 COLONIES/mL PROTEUS MIRABILIS  MRSA PCR Screening     Status: Abnormal   Collection Time: 07/31/20  5:40 AM   Specimen: Nasal Mucosa; Nasopharyngeal  Result Value Ref Range Status   MRSA by PCR POSITIVE (A) NEGATIVE Final    Comment:        The GeneXpert MRSA Assay (FDA approved for NASAL specimens only), is one component of a comprehensive MRSA colonization surveillance program. It is  not intended to diagnose MRSA infection nor to guide or monitor treatment for MRSA infections. CRITICAL RESULT CALLED TO, READ BACK BY AND VERIFIED WITH: RN LIANA K. AT 6295 07/31/20 BY MM. Performed at [facility], [place] [address], [place], [place] [address]   Culture, blood (Routine X 2) w Reflex to ID Panel     Status: None (Preliminary result)   Collection Time: 07/31/20 12:29 PM   Specimen: BLOOD RIGHT FOREARM  Result Value Ref Range Status   Specimen Description BLOOD RIGHT FOREARM  Final   Special Requests   Final    BOTTLES DRAWN AEROBIC AND ANAEROBIC Blood Culture adequate volume   Culture   Final    NO GROWTH 3 DAYS Performed at [facility], [place] [address] [place], [place] [address]    Report Status PENDING  Incomplete  SARS Coronavirus 2 by RT PCR (hospital order, performed in [facility] hospital lab) Nasopharyngeal Nasopharyngeal Swab     Status: None   Collection Time: 08/02/20  4:49 PM   Specimen: Nasopharyngeal Swab  Result Value Ref Range Status   SARS Coronavirus 2 NEGATIVE NEGATIVE Final    Comment: (NOTE) SARS-CoV-2 target nucleic acids are NOT DETECTED.  The SARS-CoV-2 RNA is generally detectable in upper and lower respiratory specimens during the acute phase of infection.  The lowest concentration of SARS-CoV-2 viral copies this assay can detect is 250 copies / mL. A negative result does not preclude SARS-CoV-2 infection and should not be used as the sole basis for treatment or other patient management decisions.  A negative result may occur with improper specimen collection / handling, submission of specimen other than nasopharyngeal swab, presence of viral mutation(s) within the areas targeted by this assay, and inadequate number of viral copies (<250 copies / mL). A negative result must be combined with clinical observations, patient history, and epidemiological information.  Fact Sheet for Patients:    StrictlyIdeas.no  Fact Sheet for Healthcare Providers: BankingDealers.co.za  This test is not yet approved or  cleared by the [place] FDA and has been authorized for detection and/or diagnosis of SARS-CoV-2 by FDA under an Emergency Use Authorization (EUA).  This EUA will remain in effect (meaning this test can be used) for the duration of the COVID-19 declaration under Section 564(b)(1) of the Act, 21 U.S.C. section 360bbb-3(b)(1), unless the authorization is terminated or revoked sooner.  Performed at [facility], [place] [address] [place], [place] [address]     FURTHER DISCHARGE INSTRUCTIONS:  Get Medicines reviewed and adjusted: Please take all your medications with you for your next visit with your Primary MD  Laboratory/radiological data: Please request your Primary MD to go over all hospital tests and procedure/radiological results at the follow up, please ask your Primary MD to get all Hospital records sent to his/her office.  In some cases, they will be blood work, cultures and biopsy results pending at the time of your discharge. Please request that your primary care M.D. goes through all the records of your hospital data and follows up on these results.  Also Note the following: If you experience worsening of your admission symptoms, develop shortness of breath, life threatening emergency, suicidal or homicidal thoughts you must seek medical attention immediately by calling 911 or calling your MD immediately  if symptoms less severe.  You must read complete instructions/literature along with all the possible adverse reactions/side effects for all the Medicines you take and that have been prescribed to you. Take any new Medicines after you have completely understood and accpet all the possible adverse reactions/side effects.   Do not drive when taking Pain medications or sleeping medications (Benzodaizepines)  Do  not take more than prescribed Pain, Sleep and Anxiety Medications. It is not advisable to combine anxiety,sleep and pain medications without talking with your primary care practitioner  Special Instructions: If you have smoked or chewed Tobacco  in the last 2 yrs please stop smoking, stop any regular Alcohol  and or any Recreational drug use.  Wear Seat belts while driving.  Please note: You were cared for by a hospitalist during your hospital stay. Once you are discharged, your primary care physician will handle any further medical issues. Please note that NO REFILLS for any discharge medications will be authorized once you are discharged, as it is imperative that you return to your primary care physician (or establish a relationship with a primary care physician if you do not have one) for your post hospital discharge needs so that they can reassess your need for medications and monitor your lab values.  Total Time spent coordinating discharge including counseling, education and face to face time equals 35 minutes.  SignedOren Binet 08/03/2020 10:12 AM

## 2020-08-05 LAB — CULTURE, BLOOD (ROUTINE X 2)
Culture: NO GROWTH
Culture: NO GROWTH
Special Requests: ADEQUATE
Special Requests: ADEQUATE

## 2020-08-05 LAB — GLUCOSE, CAPILLARY: Glucose-Capillary: <10 mg/dL — CL (ref 70–99)

## 2020-08-12 ENCOUNTER — Encounter (HOSPITAL_COMMUNITY): Payer: Self-pay

## 2020-08-12 ENCOUNTER — Emergency Department (HOSPITAL_COMMUNITY): Payer: Medicare Other

## 2020-08-12 ENCOUNTER — Inpatient Hospital Stay (HOSPITAL_COMMUNITY)
Admission: EM | Admit: 2020-08-12 | Discharge: 2020-08-14 | DRG: 312 | Disposition: A | Discharge status: Skilled Nursing Facility | Payer: Medicare Other | Source: Skilled Nursing Facility | Attending: Internal Medicine | Admitting: Internal Medicine

## 2020-08-12 DIAGNOSIS — Z560 Unemployment, unspecified: Secondary | ICD-10-CM

## 2020-08-12 DIAGNOSIS — R627 Adult failure to thrive: Secondary | ICD-10-CM | POA: Diagnosis present

## 2020-08-12 DIAGNOSIS — G9341 Metabolic encephalopathy: Secondary | ICD-10-CM | POA: Diagnosis present

## 2020-08-12 DIAGNOSIS — L89321 Pressure ulcer of left buttock, stage 1: Secondary | ICD-10-CM | POA: Diagnosis present

## 2020-08-12 DIAGNOSIS — I4891 Unspecified atrial fibrillation: Secondary | ICD-10-CM | POA: Diagnosis present

## 2020-08-12 DIAGNOSIS — N179 Acute kidney failure, unspecified: Secondary | ICD-10-CM

## 2020-08-12 DIAGNOSIS — Z86718 Personal history of other venous thrombosis and embolism: Secondary | ICD-10-CM

## 2020-08-12 DIAGNOSIS — Z79890 Hormone replacement therapy (postmenopausal): Secondary | ICD-10-CM

## 2020-08-12 DIAGNOSIS — W1811XA Fall from or off toilet without subsequent striking against object, initial encounter: Secondary | ICD-10-CM | POA: Diagnosis present

## 2020-08-12 DIAGNOSIS — Z981 Arthrodesis status: Secondary | ICD-10-CM

## 2020-08-12 DIAGNOSIS — K449 Diaphragmatic hernia without obstruction or gangrene: Secondary | ICD-10-CM | POA: Diagnosis present

## 2020-08-12 DIAGNOSIS — I130 Hypertensive heart and chronic kidney disease with heart failure and stage 1 through stage 4 chronic kidney disease, or unspecified chronic kidney disease: Secondary | ICD-10-CM | POA: Diagnosis present

## 2020-08-12 DIAGNOSIS — I5031 Acute diastolic (congestive) heart failure: Secondary | ICD-10-CM | POA: Diagnosis present

## 2020-08-12 DIAGNOSIS — E039 Hypothyroidism, unspecified: Secondary | ICD-10-CM | POA: Diagnosis present

## 2020-08-12 DIAGNOSIS — R296 Repeated falls: Secondary | ICD-10-CM | POA: Diagnosis present

## 2020-08-12 DIAGNOSIS — R55 Syncope and collapse: Principal | ICD-10-CM

## 2020-08-12 DIAGNOSIS — F419 Anxiety disorder, unspecified: Secondary | ICD-10-CM | POA: Diagnosis present

## 2020-08-12 DIAGNOSIS — Z7901 Long term (current) use of anticoagulants: Secondary | ICD-10-CM

## 2020-08-12 DIAGNOSIS — Z20822 Contact with and (suspected) exposure to covid-19: Secondary | ICD-10-CM | POA: Diagnosis present

## 2020-08-12 DIAGNOSIS — Z9049 Acquired absence of other specified parts of digestive tract: Secondary | ICD-10-CM

## 2020-08-12 DIAGNOSIS — Z794 Long term (current) use of insulin: Secondary | ICD-10-CM

## 2020-08-12 DIAGNOSIS — N1832 Chronic kidney disease, stage 3b: Secondary | ICD-10-CM | POA: Diagnosis present

## 2020-08-12 DIAGNOSIS — L8990 Pressure ulcer of unspecified site, unspecified stage: Secondary | ICD-10-CM | POA: Diagnosis present

## 2020-08-12 DIAGNOSIS — F0390 Unspecified dementia without behavioral disturbance: Secondary | ICD-10-CM | POA: Diagnosis present

## 2020-08-12 DIAGNOSIS — E785 Hyperlipidemia, unspecified: Secondary | ICD-10-CM | POA: Diagnosis present

## 2020-08-12 DIAGNOSIS — E119 Type 2 diabetes mellitus without complications: Secondary | ICD-10-CM

## 2020-08-12 DIAGNOSIS — E875 Hyperkalemia: Secondary | ICD-10-CM | POA: Diagnosis present

## 2020-08-12 DIAGNOSIS — Z86711 Personal history of pulmonary embolism: Secondary | ICD-10-CM | POA: Diagnosis present

## 2020-08-12 DIAGNOSIS — K649 Unspecified hemorrhoids: Secondary | ICD-10-CM | POA: Diagnosis present

## 2020-08-12 DIAGNOSIS — Z8673 Personal history of transient ischemic attack (TIA), and cerebral infarction without residual deficits: Secondary | ICD-10-CM

## 2020-08-12 DIAGNOSIS — R410 Disorientation, unspecified: Secondary | ICD-10-CM

## 2020-08-12 DIAGNOSIS — J449 Chronic obstructive pulmonary disease, unspecified: Secondary | ICD-10-CM | POA: Diagnosis present

## 2020-08-12 DIAGNOSIS — S0181XA Laceration without foreign body of other part of head, initial encounter: Secondary | ICD-10-CM | POA: Diagnosis present

## 2020-08-12 DIAGNOSIS — Z6830 Body mass index (BMI) 30.0-30.9, adult: Secondary | ICD-10-CM

## 2020-08-12 DIAGNOSIS — J9611 Chronic respiratory failure with hypoxia: Secondary | ICD-10-CM | POA: Diagnosis present

## 2020-08-12 DIAGNOSIS — F32A Depression, unspecified: Secondary | ICD-10-CM | POA: Diagnosis present

## 2020-08-12 DIAGNOSIS — K219 Gastro-esophageal reflux disease without esophagitis: Secondary | ICD-10-CM | POA: Diagnosis present

## 2020-08-12 DIAGNOSIS — Z833 Family history of diabetes mellitus: Secondary | ICD-10-CM

## 2020-08-12 DIAGNOSIS — I10 Essential (primary) hypertension: Secondary | ICD-10-CM | POA: Diagnosis present

## 2020-08-12 DIAGNOSIS — E873 Alkalosis: Secondary | ICD-10-CM

## 2020-08-12 DIAGNOSIS — D6859 Other primary thrombophilia: Secondary | ICD-10-CM | POA: Diagnosis present

## 2020-08-12 DIAGNOSIS — Z66 Do not resuscitate: Secondary | ICD-10-CM | POA: Diagnosis present

## 2020-08-12 DIAGNOSIS — Z9981 Dependence on supplemental oxygen: Secondary | ICD-10-CM

## 2020-08-12 DIAGNOSIS — W19XXXA Unspecified fall, initial encounter: Secondary | ICD-10-CM | POA: Diagnosis present

## 2020-08-12 DIAGNOSIS — Z79899 Other long term (current) drug therapy: Secondary | ICD-10-CM

## 2020-08-12 DIAGNOSIS — E1122 Type 2 diabetes mellitus with diabetic chronic kidney disease: Secondary | ICD-10-CM | POA: Diagnosis present

## 2020-08-12 DIAGNOSIS — Z87891 Personal history of nicotine dependence: Secondary | ICD-10-CM

## 2020-08-12 LAB — COMPREHENSIVE METABOLIC PANEL
ALT: 20 U/L (ref 0–44)
AST: 23 U/L (ref 15–41)
Albumin: 3.5 g/dL (ref 3.5–5.0)
Alkaline Phosphatase: 75 U/L (ref 38–126)
Anion gap: 11 (ref 5–15)
BUN: 23 mg/dL (ref 8–23)
CO2: 34 mmol/L — ABNORMAL HIGH (ref 22–32)
Calcium: 9.6 mg/dL (ref 8.9–10.3)
Chloride: 99 mmol/L (ref 98–111)
Creatinine, Ser: 1.6 mg/dL — ABNORMAL HIGH (ref 0.44–1.00)
GFR, Estimated: 32 mL/min — ABNORMAL LOW (ref >60)
Glucose, Bld: 118 mg/dL — ABNORMAL HIGH (ref 70–99)
Potassium: 5.6 mmol/L — ABNORMAL HIGH (ref 3.5–5.1)
Sodium: 144 mmol/L (ref 135–145)
Total Bilirubin: 0.6 mg/dL (ref 0.3–1.2)
Total Protein: 6.9 g/dL (ref 6.5–8.1)

## 2020-08-12 LAB — RESP PANEL BY RT-PCR (FLU A&B, COVID) ARPGX2
Influenza A by PCR: NEGATIVE
Influenza B by PCR: NEGATIVE
SARS Coronavirus 2 by RT PCR: NEGATIVE

## 2020-08-12 LAB — CBC
HCT: 42.1 % (ref 36.0–46.0)
Hemoglobin: 12.7 g/dL (ref 12.0–15.0)
MCH: 28.3 pg (ref 26.0–34.0)
MCHC: 30.2 g/dL (ref 30.0–36.0)
MCV: 94 fL (ref 80.0–100.0)
Platelets: 252 10*3/uL (ref 150–400)
RBC: 4.48 MIL/uL (ref 3.87–5.11)
RDW: 17.1 % — ABNORMAL HIGH (ref 11.5–15.5)
WBC: 10.6 10*3/uL — ABNORMAL HIGH (ref 4.0–10.5)
nRBC: 0 % (ref 0.0–0.2)

## 2020-08-12 LAB — TROPONIN I (HIGH SENSITIVITY)
Troponin I (High Sensitivity): 3 ng/L (ref <18)
Troponin I (High Sensitivity): 4 ng/L (ref <18)

## 2020-08-12 LAB — CBG MONITORING, ED: Glucose-Capillary: 268 mg/dL — ABNORMAL HIGH (ref 70–99)

## 2020-08-12 MED ORDER — INSULIN ASPART 100 UNIT/ML ~~LOC~~ SOLN
0.0000 [IU] | Freq: Three times a day (TID) | SUBCUTANEOUS | Status: DC
  Administered 2020-08-13: 3 [IU] via SUBCUTANEOUS
  Administered 2020-08-14: 2 [IU] via SUBCUTANEOUS
  Administered 2020-08-14: 3 [IU] via SUBCUTANEOUS
  Filled 2020-08-12: qty 0.15

## 2020-08-12 MED ORDER — ACETAMINOPHEN 325 MG PO TABS: 650.0000 mg | ORAL_TABLET | Freq: Four times a day (QID) | ORAL | Status: DC | PRN

## 2020-08-12 MED ORDER — ONDANSETRON HCL 4 MG PO TABS: 4.0000 mg | ORAL_TABLET | Freq: Four times a day (QID) | ORAL | Status: DC | PRN

## 2020-08-12 MED ORDER — ACETAMINOPHEN 650 MG RE SUPP: 650.0000 mg | Freq: Four times a day (QID) | RECTAL | Status: DC | PRN

## 2020-08-12 MED ORDER — CARVEDILOL 12.5 MG PO TABS
12.5000 mg | ORAL_TABLET | Freq: Two times a day (BID) | ORAL | Status: DC
  Administered 2020-08-13 (×2): 12.5 mg via ORAL
  Filled 2020-08-12 (×2): qty 1

## 2020-08-12 MED ORDER — SODIUM CHLORIDE 0.9 % IV SOLN: Freq: Once | INTRAVENOUS | Status: AC

## 2020-08-12 MED ORDER — ONDANSETRON HCL 4 MG/2ML IJ SOLN: 4.0000 mg | Freq: Four times a day (QID) | INTRAMUSCULAR | Status: DC | PRN

## 2020-08-12 MED ORDER — LEVOTHYROXINE SODIUM 88 MCG PO TABS
88.0000 ug | ORAL_TABLET | Freq: Every day | ORAL | Status: DC
  Administered 2020-08-13 – 2020-08-14 (×2): 88 ug via ORAL
  Filled 2020-08-12 (×2): qty 1

## 2020-08-12 MED ORDER — SODIUM ZIRCONIUM CYCLOSILICATE 10 G PO PACK
10.0000 g | PACK | Freq: Two times a day (BID) | ORAL | Status: AC
  Administered 2020-08-12 – 2020-08-13 (×2): 10 g via ORAL
  Filled 2020-08-12 (×2): qty 1

## 2020-08-12 MED ORDER — INSULIN ASPART 100 UNIT/ML ~~LOC~~ SOLN
0.0000 [IU] | Freq: Every day | SUBCUTANEOUS | Status: DC
  Administered 2020-08-12: 3 [IU] via SUBCUTANEOUS
  Filled 2020-08-12: qty 0.05

## 2020-08-12 MED ORDER — SODIUM CHLORIDE 0.9% FLUSH: 3.0000 mL | Freq: Two times a day (BID) | INTRAVENOUS | Status: DC

## 2020-08-12 MED ORDER — LIDOCAINE HCL (PF) 1 % IJ SOLN
5.0000 mL | Freq: Once | INTRAMUSCULAR | Status: AC
  Administered 2020-08-12: 5 mL
  Filled 2020-08-12: qty 30

## 2020-08-12 NOTE — ED Provider Notes (Signed)
[facility] Emergency Department Provider Note MRN:  [id number]  Arrival date & time: 08/12/20     Chief Complaint   Fall   History of Present Illness   Dawn Morrow is a 84 y.o. year-old female with a history of stroke, diabetes, COPD presenting to the ED with chief complaint of fall.  Patient thinks that she passed out while sitting on the commode.  She fell and hit the front of her head, not sure on what.  Endorsing mild right-sided neck pain, denies back pain, no chest pain or shortness of breath, no abdominal pain, no numbness or weakness to the arms or legs.  Laceration to the forehead.  Review of Systems  A complete 10 system review of systems was obtained and all systems are negative except as noted in the HPI and PMH.   Patient's Health History    Past Medical History:  Diagnosis Date  . Anxiety and depression   . Asthma   . Cataract   . COPD (chronic obstructive pulmonary disease) ([place])   . Diabetes mellitus   . Diverticulosis   . DVT (deep vein thrombosis) in pregnancy    hx x multiple on coumadin  . GERD (gastroesophageal reflux disease)   . Hemorrhoids   . Hiatal hernia   . Hyperlipidemia   . Hypertension   . Hypothyroidism   . On home oxygen therapy    "2L; 24/7" (09/17/2017)  . Osteoarthritis    h/o spinal stenosis by MRI 2009  . Osteopenia   . Pulmonary embolism (HCC)    x 2  . Stroke [facility])    MINI  . Tubular adenoma of colon 07/2005    Past Surgical History:  Procedure Laterality Date  . APPENDECTOMY    . CATARACT EXTRACTION    . CHOLECYSTECTOMY    . LUMBAR FUSION    . POLYPECTOMY    . TONSILLECTOMY    . TUBAL LIGATION      Family History  Adopted: Yes  Problem Relation Age of Onset  . Cancer Mother        ? colon or ovarian  . Diabetes Mother   . Cancer Brother        ?  . CAD Neg Hx     Social History   Socioeconomic History  . Marital status: Divorced    Spouse name: Not on file  . Number of children:  3  . Years of education: Not on file  . Highest education level: Not on file  Occupational History  . Occupation: retired    Fish farm manager: UNEMPLOYED  Tobacco Use  . Smoking status: Former Smoker    Types: Cigarettes  . Smokeless tobacco: Never Used  Vaping Use  . Vaping Use: Never used  Substance and Sexual Activity  . Alcohol use: No    Comment: socially  . Drug use: No  . Sexual activity: Not on file  Other Topics Concern  . Not on file  Social History Narrative   ** Merged History Encounter **       Single,   lost her sister in 2012. 3 children , all live in [place] to [place] late 10-2013 Nephew Joe Cells, lives in [place]; niece [name] in [place] [phone], [phone]) Doesn't drive     Social Determinants of Health   Financial Resource Strain:   . Difficulty of Paying Living Expenses: Not on file  Food Insecurity:   . Worried About Charity fundraiser in  the Last Year: Not on file  . Ran Out of Food in the Last Year: Not on file  Transportation Needs:   . Lack of Transportation (Medical): Not on file  . Lack of Transportation (Non-Medical): Not on file  Physical Activity:   . Days of Exercise per Week: Not on file  . Minutes of Exercise per Session: Not on file  Stress:   . Feeling of Stress : Not on file  Social Connections:   . Frequency of Communication with Friends and Family: Not on file  . Frequency of Social Gatherings with Friends and Family: Not on file  . Attends Religious Services: Not on file  . Active Member of Clubs or Organizations: Not on file  . Attends Archivist Meetings: Not on file  . Marital Status: Not on file  Intimate Partner Violence:   . Fear of Current or Ex-Partner: Not on file  . Emotionally Abused: Not on file  . Physically Abused: Not on file  . Sexually Abused: Not on file     Physical Exam   Vitals:   08/12/20 1257 08/12/20 1300  BP: (!) 81/66 103/81  Pulse: 79 73  Resp: 12   Temp:    SpO2: 99%      CONSTITUTIONAL: Well-appearing, NAD NEURO:  Alert and oriented x 3, no focal deficits EYES:  eyes equal and reactive ENT/NECK:  no LAD, no JVD CARDIO: Regular rate, well-perfused, normal S1 and S2 PULM:  CTAB no wheezing or rhonchi GI/GU:  normal bowel sounds, non-distended, non-tender MSK/SPINE:  No gross deformities, no edema SKIN: Laceration to the mid forehead, 2 or 3 cm PSYCH:  Appropriate speech and behavior  *Additional and/or pertinent findings included in MDM below  Diagnostic and Interventional Summary    EKG Interpretation  Date/Time:  Monday August 12 2020 12:54:54 EST Ventricular Rate:  78 PR Interval:    QRS Duration: 83 QT Interval:  397 QTC Calculation: 432 R Axis:   15 Text Interpretation: Atrial fibrillation Ventricular premature complex Borderline low voltage, extremity leads Confirmed by Gerlene Fee [phone]) on 08/12/2020 1:00:41 PM      Labs Reviewed  CBC - Abnormal; Notable for the following components:      Result Value   WBC 10.6 (*)    RDW 17.1 (*)    All other components within normal limits  COMPREHENSIVE METABOLIC PANEL - Abnormal; Notable for the following components:   Potassium 5.6 (*)    CO2 34 (*)    Glucose, Bld 118 (*)    Creatinine, Ser 1.60 (*)    GFR, Estimated 32 (*)    All other components within normal limits  RESP PANEL BY RT-PCR (FLU A&B, COVID) [id number]  URINALYSIS, ROUTINE W REFLEX MICROSCOPIC  TROPONIN I (HIGH SENSITIVITY)  TROPONIN I (HIGH SENSITIVITY)    CT HEAD WO CONTRAST  Final Result    CT CERVICAL SPINE WO CONTRAST  Final Result    DG Chest 2 View  Final Result      Medications  lidocaine (PF) (XYLOCAINE) 1 % injection 5 mL (has no administration in time range)     Procedures  /  Critical Care Procedures  ED Course and Medical Decision Making  I have reviewed the triage vital signs, the nursing notes, and pertinent available records from the EMR.  Listed above are laboratory and imaging tests that  I personally ordered, reviewed, and interpreted and then considered in my medical decision making (see below for details).  Syncopal episode  while on the commode, laceration to the forehead.  Anticoagulated.  Awaiting CT imaging, laboratory assessment.  Patient overall has little to no complaints and wishes to go home.     Patient seems to be becoming more confused during her ED stay, she does not recall the events of her fall today, she is intermittently repeating herself.  CT head is reassuring, no focal neurological deficits.  Labs reveal AKI, metabolic alkalosis.  Given all this, will admit to medicine for further care.  Barth Kirks. [place] Small, [place] [email or web address]  Final Clinical Impressions(s) / ED Diagnoses     ICD-10-CM   1. Confusion  R41.0   2. Syncope  R55 DG Chest 2 View    DG Chest 2 View  3. Metabolic alkalosis  X09.4   4. AKI (acute kidney injury) ([place])  N17.9     ED Discharge Orders    None       Discharge Instructions Discussed with and Provided to Patient:   Discharge Instructions   None       Maudie Flakes, MD 08/12/20 [phone]

## 2020-08-12 NOTE — ED Triage Notes (Signed)
Pt presents via EMS from [facility] had an unwitnessed fall, RN from facility encouraged pt to come to the ER. Pt is c/o some neck pain, no other complaints, alert and oriented.

## 2020-08-12 NOTE — ED Notes (Signed)
ED TO INPATIENT HANDOFF REPORT  Name/Age/Gender Dawn Morrow 84 y.o. female  Code Status    Code Status Orders  (From admission, onward)         Start     Ordered   08/12/20 1806  Do not attempt resuscitation (DNR)  Continuous       Question Answer Comment  In the event of cardiac or respiratory ARREST Do not call a "code blue"   In the event of cardiac or respiratory ARREST Do not perform Intubation, CPR, defibrillation or ACLS   In the event of cardiac or respiratory ARREST Use medication by any route, position, wound care, and other measures to relive pain and suffering. May use oxygen, suction and manual treatment of airway obstruction as needed for comfort.   Comments Confirmed on paperwork with [place].      08/12/20 1805        Code Status History    Date Active Date Inactive Code Status Order ID Comments User Context   07/31/2020 0302 08/03/2020 1943 DNR [id number]  Shela Leff, MD ED   07/17/2020 1522 07/19/2020 2049 DNR [id number]  Loistine Chance, MD Inpatient   07/11/2020 1537 07/17/2020 1522 Full Code [id number]  Jonnie Finner, DO ED   05/03/2020 1428 05/06/2020 2112 DNR [id number]  Barb Merino, MD Inpatient   04/27/2020 1739 05/03/2020 1428 Full Code [id number]  Cristal Ford, DO ED   03/26/2020 1918 04/04/2020 1858 Full Code [id number]  Orene Desanctis, DO ED   12/14/2019 1645 12/18/2019 1834 Full Code [id number]  Antonieta Pert, MD ED   10/02/2019 0059 10/03/2019 1913 Full Code [id number]  [place], Warnell Bureau, MD ED   09/17/2017 0456 09/21/2017 0155 Partial Code [id number]  Rise Patience, MD ED   09/17/2017 0455 09/17/2017 0455 Full Code [id number]  Rise Patience, MD ED   06/21/2014 2151 06/24/2014 1902 Partial Code [id number]  Shanda Howells, MD ED   10/28/2013 1511 06/21/2014 2151 Full Code [id number]  Hennie Duos, MD Outpatient   09/18/2013 1334 09/21/2013 1810 Full Code [id number]  Hosie Poisson, MD ED   Advance Care Planning Activity    Advance Directive  Documentation     Most Recent Value  Type of Advance Directive Healthcare Power of Attorney  Pre-existing out of facility DNR order (yellow form or pink MOST form) --  "MOST" Form in Place? --      Home/SNF/Other Nursing Home  Chief Complaint Syncope [R55]  Level of Care/Admitting Diagnosis ED Disposition    ED Disposition Condition [place]: [facility] [[id number]]  Level of Care: Telemetry [5]  Admit to tele based on following criteria: Eval of Syncope  Covid Evaluation: Asymptomatic Screening Protocol (No Symptoms)  Diagnosis: Syncope [206001]  Admitting Physician: Jonnie Finner [[id number]]  Attending Physician: Jonnie Finner [[id number]]       Medical History Past Medical History:  Diagnosis Date  . Anxiety and depression   . Asthma   . Cataract   . COPD (chronic obstructive pulmonary disease) ([place])   . Diabetes mellitus   . Diverticulosis   . DVT (deep vein thrombosis) in pregnancy    hx x multiple on coumadin  . GERD (gastroesophageal reflux disease)   . Hemorrhoids   . Hiatal hernia   . Hyperlipidemia   . Hypertension   . Hypothyroidism   . On home oxygen therapy    "2L; 24/7" (09/17/2017)  . Osteoarthritis    h/o spinal stenosis by  MRI 2009  . Osteopenia   . Pulmonary embolism (HCC)    x 2  . Stroke [facility])    MINI  . Tubular adenoma of colon 07/2005    Allergies Allergies  Allergen Reactions  . Penicillins Anaphylaxis  . Codeine Other (See Comments)    Listed on MAR  . Sulfonylureas Other (See Comments)    Listed on MAR  . Sulfonamide Derivatives Other (See Comments)    Unknown reaction    IV Location/Drains/Wounds Patient Lines/Drains/Airways Status    Active Line/Drains/Airways    Name Placement date Placement time Site Days   Peripheral IV 08/12/20 Left Antecubital 08/12/20  1600  Antecubital  less than 1   External Urinary Catheter 07/11/20  1900  --  32   Pressure Injury 07/31/20 Buttocks  Right;Left;Mid Stage 2 -  Partial thickness loss of dermis presenting as a shallow open injury with a red, pink wound bed without slough. 07/31/20  0441   12          Labs/Imaging Results for orders placed or performed during the hospital encounter of 08/12/20 (from the past 48 hour(s))  CBC     Status: Abnormal   Collection Time: 08/12/20  1:50 PM  Result Value Ref Range   WBC 10.6 (H) 4.0 - 10.5 K/uL   RBC 4.48 3.87 - 5.11 MIL/uL   Hemoglobin 12.7 12.0 - 15.0 g/dL   HCT 42.1 36 - 46 %   MCV 94.0 80.0 - 100.0 fL   MCH 28.3 26.0 - 34.0 pg   MCHC 30.2 30.0 - 36.0 g/dL   RDW 17.1 (H) 11.5 - 15.5 %   Platelets 252 150 - 400 K/uL   nRBC 0.0 0.0 - 0.2 %    Comment: Performed at [facility], [place] [address], [place], [place] [address]  Comprehensive metabolic panel     Status: Abnormal   Collection Time: 08/12/20  1:50 PM  Result Value Ref Range   Sodium 144 135 - 145 mmol/L   Potassium 5.6 (H) 3.5 - 5.1 mmol/L   Chloride 99 98 - 111 mmol/L   CO2 34 (H) 22 - 32 mmol/L   Glucose, Bld 118 (H) 70 - 99 mg/dL    Comment: Glucose reference range applies only to samples taken after fasting for at least 8 hours.   BUN 23 8 - 23 mg/dL   Creatinine, Ser 1.60 (H) 0.44 - 1.00 mg/dL   Calcium 9.6 8.9 - 10.3 mg/dL   Total Protein 6.9 6.5 - 8.1 g/dL   Albumin 3.5 3.5 - 5.0 g/dL   AST 23 15 - 41 U/L   ALT 20 0 - 44 U/L   Alkaline Phosphatase 75 38 - 126 U/L   Total Bilirubin 0.6 0.3 - 1.2 mg/dL   GFR, Estimated 32 (L) >60 mL/min    Comment: (NOTE) Calculated using the CKD-EPI Creatinine Equation (2021)    Anion gap 11 5 - 15    Comment: Performed at [facility], [place] [address] [place], [place] [address]  Troponin I (High Sensitivity)     Status: None   Collection Time: 08/12/20  1:50 PM  Result Value Ref Range   Troponin I (High Sensitivity) 3 <18 ng/L    Comment: (NOTE) Elevated high sensitivity troponin I (hsTnI) values and significant  changes  across serial measurements may suggest ACS but many other  chronic and acute conditions are known to elevate hsTnI results.  Refer to the "Links" section for  chest pain algorithms and additional  guidance. Performed at [facility], [place] [address], [place], [place] [address]   Resp Panel by RT-PCR (Flu A&B, Covid) Nasopharyngeal Swab     Status: None   Collection Time: 08/12/20  2:58 PM   Specimen: Nasopharyngeal Swab; Nasopharyngeal(NP) swabs in vial transport medium  Result Value Ref Range   SARS Coronavirus 2 by RT PCR NEGATIVE NEGATIVE    Comment: (NOTE) SARS-CoV-2 target nucleic acids are NOT DETECTED.  The SARS-CoV-2 RNA is generally detectable in upper respiratory specimens during the acute phase of infection. The lowest concentration of SARS-CoV-2 viral copies this assay can detect is 138 copies/mL. A negative result does not preclude SARS-Cov-2 infection and should not be used as the sole basis for treatment or other patient management decisions. A negative result may occur with  improper specimen collection/handling, submission of specimen other than nasopharyngeal swab, presence of viral mutation(s) within the areas targeted by this assay, and inadequate number of viral copies(<138 copies/mL). A negative result must be combined with clinical observations, patient history, and epidemiological information. The expected result is Negative.  Fact Sheet for Patients:  EntrepreneurPulse.com.au  Fact Sheet for Healthcare Providers:  IncredibleEmployment.be  This test is no t yet approved or cleared by the [place] FDA and  has been authorized for detection and/or diagnosis of SARS-CoV-2 by FDA under an Emergency Use Authorization (EUA). This EUA will remain  in effect (meaning this test can be used) for the duration of the COVID-19 declaration under Section 564(b)(1) of the Act, 21 U.S.C.section 360bbb-3(b)(1),  unless the authorization is terminated  or revoked sooner.       Influenza A by PCR NEGATIVE NEGATIVE   Influenza B by PCR NEGATIVE NEGATIVE    Comment: (NOTE) The Xpert Xpress SARS-CoV-2/FLU/RSV plus assay is intended as an aid in the diagnosis of influenza from Nasopharyngeal swab specimens and should not be used as a sole basis for treatment. Nasal washings and aspirates are unacceptable for Xpert Xpress SARS-CoV-2/FLU/RSV testing.  Fact Sheet for Patients: EntrepreneurPulse.com.au  Fact Sheet for Healthcare Providers: IncredibleEmployment.be  This test is not yet approved or cleared by the [place] FDA and has been authorized for detection and/or diagnosis of SARS-CoV-2 by FDA under an Emergency Use Authorization (EUA). This EUA will remain in effect (meaning this test can be used) for the duration of the COVID-19 declaration under Section 564(b)(1) of the Act, 21 U.S.C. section 360bbb-3(b)(1), unless the authorization is terminated or revoked.  Performed at [facility], [place] [address], [place], [place] [address]   Troponin I (High Sensitivity)     Status: None   Collection Time: 08/12/20  4:45 PM  Result Value Ref Range   Troponin I (High Sensitivity) 4 <18 ng/L    Comment: (NOTE) Elevated high sensitivity troponin I (hsTnI) values and significant  changes across serial measurements may suggest ACS but many other  chronic and acute conditions are known to elevate hsTnI results.  Refer to the "Links" section for chest pain algorithms and additional  guidance. Performed at [facility], [place] [address], [place], [place] [address]   CBG monitoring, ED     Status: Abnormal   Collection Time: 08/12/20  9:45 PM  Result Value Ref Range   Glucose-Capillary 268 (H) 70 - 99 mg/dL    Comment: Glucose reference range applies only to samples taken after fasting for at least 8 hours.   DG Chest 2  View  Result  Date: 08/12/2020 CLINICAL DATA:  Un witnessed fall. EXAM: CHEST - 2 VIEW COMPARISON:  July 30, 2020. FINDINGS: Enlarged cardiac silhouette. Aortic atherosclerosis. Left basilar opacities, improved from prior. No visible pleural effusions or pneumothorax. No evidence of acute osseous abnormality. IMPRESSION: Improved left basilar opacities, which could represent atelectasis, aspiration, and/or pneumonia. Electronically Signed   By: Margaretha Sheffield MD   On: 08/12/2020 12:25   CT HEAD WO CONTRAST  Result Date: 08/12/2020 CLINICAL DATA:  Un witnessed fall. EXAM: CT HEAD WITHOUT CONTRAST CT CERVICAL SPINE WITHOUT CONTRAST TECHNIQUE: Multidetector CT imaging of the head and cervical spine was performed following the standard protocol without intravenous contrast. Multiplanar CT image reconstructions of the cervical spine were also generated. COMPARISON:  Cervical radiographs 04/09/2010. CT head July 11, 2020. FINDINGS: CT HEAD FINDINGS Brain: No evidence of acute infarction, hemorrhage, hydrocephalus, extra-axial collection or mass lesion/mass effect. Similar patchy white matter hypoattenuation, which is nonspecific but most likely related to chronic microvascular ischemic disease. Vascular: Calcific atherosclerosis. Skull: No acute fracture. Sinuses/Orbits: Visualized sinuses are clear.  Unremarkable orbits. Other: No mastoid effusions. CT CERVICAL SPINE FINDINGS Alignment: Normal. Skull base and vertebrae: No evidence of acute fracture. Vertebral body heights are maintained. Osteopenia. Soft tissues and spinal canal: No prevertebral fluid or swelling. No visible canal hematoma. Disc levels: Mild-to-moderate multilevel degenerative disc disease. Multilevel facet and uncovertebral hypertrophy. Upper chest: No acute findings. Other: Calcific atherosclerosis of the carotid arteries. IMPRESSION: 1. No evidence of acute intracranial abnormality. Chronic microvascular ischemic disease. 2. No  evidence of acute fracture or traumatic malalignment in the cervical spine. Electronically Signed   By: Margaretha Sheffield MD   On: 08/12/2020 12:57   CT CERVICAL SPINE WO CONTRAST  Result Date: 08/12/2020 CLINICAL DATA:  Un witnessed fall. EXAM: CT HEAD WITHOUT CONTRAST CT CERVICAL SPINE WITHOUT CONTRAST TECHNIQUE: Multidetector CT imaging of the head and cervical spine was performed following the standard protocol without intravenous contrast. Multiplanar CT image reconstructions of the cervical spine were also generated. COMPARISON:  Cervical radiographs 04/09/2010. CT head July 11, 2020. FINDINGS: CT HEAD FINDINGS Brain: No evidence of acute infarction, hemorrhage, hydrocephalus, extra-axial collection or mass lesion/mass effect. Similar patchy white matter hypoattenuation, which is nonspecific but most likely related to chronic microvascular ischemic disease. Vascular: Calcific atherosclerosis. Skull: No acute fracture. Sinuses/Orbits: Visualized sinuses are clear.  Unremarkable orbits. Other: No mastoid effusions. CT CERVICAL SPINE FINDINGS Alignment: Normal. Skull base and vertebrae: No evidence of acute fracture. Vertebral body heights are maintained. Osteopenia. Soft tissues and spinal canal: No prevertebral fluid or swelling. No visible canal hematoma. Disc levels: Mild-to-moderate multilevel degenerative disc disease. Multilevel facet and uncovertebral hypertrophy. Upper chest: No acute findings. Other: Calcific atherosclerosis of the carotid arteries. IMPRESSION: 1. No evidence of acute intracranial abnormality. Chronic microvascular ischemic disease. 2. No evidence of acute fracture or traumatic malalignment in the cervical spine. Electronically Signed   By: Margaretha Sheffield MD   On: 08/12/2020 12:57    Pending Labs Unresulted Labs (From admission, onward)          Start     Ordered   08/12/20 1806  Urinalysis, Complete w Microscopic  Once,   STAT        08/12/20 1805   08/12/20 1457   Urinalysis, Routine w reflex microscopic  ONCE - STAT,   STAT        08/12/20 1456          Vitals/Pain Today's Vitals   08/12/20 2030 08/12/20 2100 08/12/20  2115 08/12/20 2240  BP: 111/90 (!) 115/102 (!) 119/98 (!) 146/64  Pulse: 90 74 75 74  Resp: (!) 21 19 16 19   Temp:      SpO2: 99% 99% 96% 93%  PainSc:        Isolation Precautions No active isolations  Medications Medications  acetaminophen (TYLENOL) tablet 650 mg (has no administration in time range)    Or  acetaminophen (TYLENOL) suppository 650 mg (has no administration in time range)  ondansetron (ZOFRAN) tablet 4 mg (has no administration in time range)    Or  ondansetron (ZOFRAN) injection 4 mg (has no administration in time range)  sodium zirconium cyclosilicate (LOKELMA) packet 10 g (10 g Oral Given 08/12/20 2150)  insulin aspart (novoLOG) injection 0-15 Units (has no administration in time range)  insulin aspart (novoLOG) injection 0-5 Units (3 Units Subcutaneous Given 08/12/20 2149)  carvedilol (COREG) tablet 12.5 mg (has no administration in time range)  levothyroxine (SYNTHROID) tablet 88 mcg (has no administration in time range)  lidocaine (PF) (XYLOCAINE) 1 % injection 5 mL (5 mLs Other Given by Other 08/12/20 1500)  0.9 %  sodium chloride infusion ( Intravenous New Bag/Given 08/12/20 1844)    Mobility walks with device

## 2020-08-12 NOTE — ED Notes (Signed)
Pt states she is unable to urinate at this time. Pt placed on a Purewick and encouraged to provide urine sample.

## 2020-08-12 NOTE — Plan of Care (Signed)
  Problem: Education: Goal: Knowledge of General Education information will improve Description: Including pain rating scale, medication(s)/side effects and non-pharmacologic comfort measures Outcome: Progressing   Problem: Health Behavior/Discharge Planning: Goal: Ability to manage health-related needs will improve Outcome: Progressing   Problem: Clinical Measurements: Goal: Ability to maintain clinical measurements within normal limits will improve Outcome: Progressing Goal: Will remain free from infection Outcome: Progressing Goal: Diagnostic test results will improve Outcome: Progressing Goal: Respiratory complications will improve Outcome: Progressing Goal: Cardiovascular complication will be avoided Outcome: Progressing   Problem: Activity: Goal: Risk for activity intolerance will decrease Outcome: Progressing   Problem: Nutrition: Goal: Adequate nutrition will be maintained Outcome: Progressing   Problem: Coping: Goal: Level of anxiety will decrease Outcome: Progressing   Problem: Elimination: Goal: Will not experience complications related to bowel motility Outcome: Progressing Goal: Will not experience complications related to urinary retention Outcome: Progressing   Problem: Pain Managment: Goal: General experience of comfort will improve Outcome: Progressing   Problem: Safety: Goal: Ability to remain free from injury will improve Outcome: Progressing   Problem: Skin Integrity: Goal: Risk for impaired skin integrity will decrease Outcome: Progressing   Problem: Education: Goal: Ability to describe self-care measures that may prevent or decrease complications (Diabetes Survival Skills Education) will improve Outcome: Progressing Goal: Individualized Educational Video(s) Outcome: Progressing   Problem: Coping: Goal: Ability to adjust to condition or change in health will improve Outcome: Progressing   Problem: Fluid Volume: Goal: Ability to  maintain a balanced intake and output will improve Outcome: Progressing   Problem: Health Behavior/Discharge Planning: Goal: Ability to identify and utilize available resources and services will improve Outcome: Progressing Goal: Ability to manage health-related needs will improve Outcome: Progressing   Problem: Metabolic: Goal: Ability to maintain appropriate glucose levels will improve Outcome: Progressing   Problem: Nutritional: Goal: Maintenance of adequate nutrition will improve Outcome: Progressing Goal: Progress toward achieving an optimal weight will improve Outcome: Progressing   Problem: Skin Integrity: Goal: Risk for impaired skin integrity will decrease Outcome: Progressing   Problem: Tissue Perfusion: Goal: Adequacy of tissue perfusion will improve Outcome: Progressing   Problem: Education: Goal: Knowledge of condition and prescribed therapy will improve Outcome: Progressing   Problem: Cardiac: Goal: Will achieve and/or maintain adequate cardiac output Outcome: Progressing   Problem: Physical Regulation: Goal: Complications related to the disease process, condition or treatment will be avoided or minimized Outcome: Progressing

## 2020-08-12 NOTE — H&P (Signed)
History and Physical    SUSY PLACZEK LNL:[id number] DOB: 1935/11/23 DOA: 08/12/2020  PCP: Wenda Low, MD  Patient coming from: [facility]  Chief Complaint: Syncope  HPI: Dawn Morrow is a 84 y.o. female with medical history significant of dementia, frequent fall, COPD, DM, a fib. Presenting after syncopal episode and fall. She reports that she tried to go to the bathroom by herself "just to show them she could do it." She was about finished with a BM when she passed out. She does not remember the fall and hitting her head. The staff found her and called for EMS. She denies any prodromal symptoms.   ED Course: CTH was negative in the ED. CT of the neck was also negative for anything acute. She was getting sutures with the EDP when she seem to have some acute confusion. [facility] was called for admission.   Review of Systems:  Denies CP, palpitations, N, V. Review of systems is otherwise negative for all not mentioned in HPI.   PMHx Past Medical History:  Diagnosis Date  . Anxiety and depression   . Asthma   . Cataract   . COPD (chronic obstructive pulmonary disease) ([place])   . Diabetes mellitus   . Diverticulosis   . DVT (deep vein thrombosis) in pregnancy    hx x multiple on coumadin  . GERD (gastroesophageal reflux disease)   . Hemorrhoids   . Hiatal hernia   . Hyperlipidemia   . Hypertension   . Hypothyroidism   . On home oxygen therapy    "2L; 24/7" (09/17/2017)  . Osteoarthritis    h/o spinal stenosis by MRI 2009  . Osteopenia   . Pulmonary embolism (HCC)    x 2  . Stroke [facility])    MINI  . Tubular adenoma of colon 07/2005    PSHx Past Surgical History:  Procedure Laterality Date  . APPENDECTOMY    . CATARACT EXTRACTION    . CHOLECYSTECTOMY    . LUMBAR FUSION    . POLYPECTOMY    . TONSILLECTOMY    . TUBAL LIGATION      SocHx  reports that she has quit smoking. Her smoking use included cigarettes. She has never used smokeless tobacco. She reports that she does  not drink alcohol and does not use drugs.  Allergies  Allergen Reactions  . Penicillins Anaphylaxis  . Codeine Other (See Comments)    Listed on MAR  . Sulfonylureas Other (See Comments)    Listed on MAR  . Sulfonamide Derivatives Other (See Comments)    Unknown reaction    FamHx Family History  Adopted: Yes  Problem Relation Age of Onset  . Cancer Mother        ? colon or ovarian  . Diabetes Mother   . Cancer Brother        ?  . CAD Neg Hx     Prior to Admission medications   Medication Sig Start Date End Date Taking? Authorizing Provider  acetaminophen (TYLENOL) 500 MG tablet Take 500 mg by mouth every 4 (four) hours as needed (pain).     [provider]  albuterol (PROVENTIL HFA;VENTOLIN HFA) 108 (90 BASE) MCG/ACT inhaler Inhale 2 puffs into the lungs every 6 (six) hours as needed for wheezing or shortness of breath. 08/05/15   Saguier, Percell Miller, PA-C  atorvastatin (LIPITOR) 10 MG tablet Take 1 tablet (10 mg total) by mouth at bedtime. 10/21/15   Colon Branch, MD  buPROPion (WELLBUTRIN XL) 300 MG  24 hr tablet Take 1 tablet (300 mg total) by mouth daily. 07/20/20   [name], MD  carvedilol (COREG) 12.5 MG tablet Take 1 tablet (12.5 mg total) by mouth 2 (two) times daily with a meal. 05/06/20   Dessa Phi, DO  Cholecalciferol (VITAMIN D) 50 MCG (2000 UT) tablet Take 2,000 Units by mouth daily.    [provider]  docusate sodium (COLACE) 100 MG capsule Take 100 mg by mouth daily.    [provider]  ezetimibe (ZETIA) 10 MG tablet Take 10 mg by mouth daily.    [provider]  gabapentin (NEURONTIN) 300 MG capsule Take 1 cap in AM, 1 cap at noon, 2 caps at bedtime Patient taking differently: Take 300-600 mg by mouth at bedtime. Take 1 capsule (300 mg) BID and Take 2 capsules (600 mg) at bedtime 12/21/14   Cameron Sprang, MD  insulin aspart (NOVOLOG FLEXPEN) 100 UNIT/ML FlexPen Inject 0-12 Units into the skin See admin instructions. Inject  0-12 units subcutaneously four times daily - before meals and at bedtime - per sliding scale: CBG 0-150 0 units, 151-200 4 units, 201-250 6 units, 251-300 8 units, 301-350 10 units, 351-400 12 units, >400 12 units    [provider]  Insulin Glargine (BASAGLAR KWIKPEN) 100 UNIT/ML Inject 5 Units into the skin at bedtime. 07/19/20   [name], MD  ketoconazole (NIZORAL) 2 % shampoo Apply 1 application topically See admin instructions. Use topically to shampoo hair twice weekly - Tuesday and Friday    [provider]  levothyroxine (SYNTHROID) 88 MCG tablet Take 88 mcg by mouth daily before breakfast.    [provider]  meclizine (ANTIVERT) 25 MG tablet Take 25 mg by mouth at bedtime as needed for dizziness.    [provider]  Multiple Vitamin (MULTIVITAMIN WITH MINERALS) TABS tablet Take 1 tablet by mouth daily. 07/20/20   [name], MD  nystatin (NYSTATIN) powder Apply 1 application topically 2 (two) times daily.    [provider]  ondansetron (ZOFRAN) 4 MG tablet Take 4 mg by mouth every 8 (eight) hours as needed for nausea or vomiting.    [provider]  OXYGEN Inhale 3 L into the lungs continuous.    [provider]  polyethylene glycol (MIRALAX / GLYCOLAX) 17 g packet Take 17 g by mouth daily as needed for mild constipation or moderate constipation.    [provider]  Propylene Glycol (SYSTANE BALANCE) 0.6 % SOLN Place 1 drop into both eyes 4 (four) times daily as needed (for dry eyes).     [provider]  Rivaroxaban (XARELTO) 15 MG TABS tablet Take 1 tablet (15 mg total) by mouth daily with supper. 08/03/20   Ghimire, Henreitta Leber, MD  sertraline (ZOLOFT) 100 MG tablet Take 1 tablet (100 mg total) by mouth daily. 07/20/20   [name], MD  clindamycin (CLEOCIN) 300 MG capsule Take 1 capsule (300 mg total) by mouth 3 (three) times daily. Patient not taking: Reported on 07/02/2017 06/17/17 07/02/17   Colon Branch, MD    Physical Exam: Vitals:   08/12/20 1041 08/12/20 1257 08/12/20 1300 08/12/20 1508  BP: 136/83 (!) 81/66 103/81 (!) 155/99  Pulse: 71 79 73 69  Resp: 18 12  17   Temp: 97.9 F (36.6 C)     SpO2: 100% 99%  99%    General: 84 y.o. female resting in bed in NAD, forehead wound bandaged Eyes: PERRL, normal sclera ENMT: Nares patent  w/o discharge, orophaynx clear, dentition normal, ears w/o discharge/lesions/ulcers Neck: Supple, trachea midline Cardiovascular: RRR, +S1, S2, no m/g/r, equal pulses throughout Respiratory: CTABL, no w/r/r, normal WOB GI: BS+, NDNT, no masses noted, no organomegaly noted MSK: No e/c/c Skin: No rashes, bruises, ulcerations noted Neuro: A&O x 3, no focal deficits Psyc: Some confusion, calm/cooperative  Labs on Admission: I have personally reviewed following labs and imaging studies  CBC: Recent Labs  Lab 08/12/20 1350  WBC 10.6*  HGB 12.7  HCT 42.1  MCV 94.0  PLT 563   Basic Metabolic Panel: Recent Labs  Lab 08/12/20 1350  NA 144  K 5.6*  CL 99  CO2 34*  GLUCOSE 118*  BUN 23  CREATININE 1.60*  CALCIUM 9.6   GFR: Estimated Creatinine Clearance: 29.7 mL/min (A) (by C-G formula based on SCr of 1.6 mg/dL (H)). Liver Function Tests: Recent Labs  Lab 08/12/20 1350  AST 23  ALT 20  ALKPHOS 75  BILITOT 0.6  PROT 6.9  ALBUMIN 3.5   No results for input(s): LIPASE, AMYLASE in the last 168 hours. No results for input(s): AMMONIA in the last 168 hours. Coagulation Profile: No results for input(s): INR, PROTIME in the last 168 hours. Cardiac Enzymes: No results for input(s): CKTOTAL, CKMB, CKMBINDEX, TROPONINI in the last 168 hours. BNP (last 3 results) No results for input(s): PROBNP in the last 8760 hours. HbA1C: No results for input(s): HGBA1C in the last 72 hours. CBG: No results for input(s): GLUCAP in the last 168 hours. Lipid Profile: No results for input(s): CHOL, HDL, LDLCALC, TRIG, CHOLHDL, LDLDIRECT in  the last 72 hours. Thyroid Function Tests: No results for input(s): TSH, T4TOTAL, FREET4, T3FREE, THYROIDAB in the last 72 hours. Anemia Panel: No results for input(s): VITAMINB12, FOLATE, FERRITIN, TIBC, IRON, RETICCTPCT in the last 72 hours. Urine analysis:    Component Value Date/Time   COLORURINE AMBER (A) 07/31/2020 0112   APPEARANCEUR CLOUDY (A) 07/31/2020 0112   LABSPEC 1.021 07/31/2020 0112   PHURINE 6.0 07/31/2020 0112   GLUCOSEU NEGATIVE 07/31/2020 0112   GLUCOSEU NEGATIVE 06/12/2014 1410   HGBUR MODERATE (A) 07/31/2020 0112   BILIRUBINUR SMALL (A) 07/31/2020 0112   BILIRUBINUR Neg 06/19/2016 1101   KETONESUR NEGATIVE 07/31/2020 0112   PROTEINUR 30 (A) 07/31/2020 0112   UROBILINOGEN >=8.0 06/19/2016 1101   UROBILINOGEN 0.2 05/31/2015 1835   NITRITE NEGATIVE 07/31/2020 0112   LEUKOCYTESUR LARGE (A) 07/31/2020 0112    Radiological Exams on Admission: DG Chest 2 View  Result Date: 08/12/2020 CLINICAL DATA:  Un witnessed fall. EXAM: CHEST - 2 VIEW COMPARISON:  July 30, 2020. FINDINGS: Enlarged cardiac silhouette. Aortic atherosclerosis. Left basilar opacities, improved from prior. No visible pleural effusions or pneumothorax. No evidence of acute osseous abnormality. IMPRESSION: Improved left basilar opacities, which could represent atelectasis, aspiration, and/or pneumonia. Electronically Signed   By: Margaretha Sheffield MD   On: 08/12/2020 12:25   CT HEAD WO CONTRAST  Result Date: 08/12/2020 CLINICAL DATA:  Un witnessed fall. EXAM: CT HEAD WITHOUT CONTRAST CT CERVICAL SPINE WITHOUT CONTRAST TECHNIQUE: Multidetector CT imaging of the head and cervical spine was performed following the standard protocol without intravenous contrast. Multiplanar CT image reconstructions of the cervical spine were also generated. COMPARISON:  Cervical radiographs 04/09/2010. CT head July 11, 2020. FINDINGS: CT HEAD FINDINGS Brain: No evidence of acute infarction, hemorrhage, hydrocephalus,  extra-axial collection or mass lesion/mass effect. Similar patchy white matter hypoattenuation, which is nonspecific but most likely related to chronic microvascular ischemic disease.  Vascular: Calcific atherosclerosis. Skull: No acute fracture. Sinuses/Orbits: Visualized sinuses are clear.  Unremarkable orbits. Other: No mastoid effusions. CT CERVICAL SPINE FINDINGS Alignment: Normal. Skull base and vertebrae: No evidence of acute fracture. Vertebral body heights are maintained. Osteopenia. Soft tissues and spinal canal: No prevertebral fluid or swelling. No visible canal hematoma. Disc levels: Mild-to-moderate multilevel degenerative disc disease. Multilevel facet and uncovertebral hypertrophy. Upper chest: No acute findings. Other: Calcific atherosclerosis of the carotid arteries. IMPRESSION: 1. No evidence of acute intracranial abnormality. Chronic microvascular ischemic disease. 2. No evidence of acute fracture or traumatic malalignment in the cervical spine. Electronically Signed   By: Margaretha Sheffield MD   On: 08/12/2020 12:57   CT CERVICAL SPINE WO CONTRAST  Result Date: 08/12/2020 CLINICAL DATA:  Un witnessed fall. EXAM: CT HEAD WITHOUT CONTRAST CT CERVICAL SPINE WITHOUT CONTRAST TECHNIQUE: Multidetector CT imaging of the head and cervical spine was performed following the standard protocol without intravenous contrast. Multiplanar CT image reconstructions of the cervical spine were also generated. COMPARISON:  Cervical radiographs 04/09/2010. CT head July 11, 2020. FINDINGS: CT HEAD FINDINGS Brain: No evidence of acute infarction, hemorrhage, hydrocephalus, extra-axial collection or mass lesion/mass effect. Similar patchy white matter hypoattenuation, which is nonspecific but most likely related to chronic microvascular ischemic disease. Vascular: Calcific atherosclerosis. Skull: No acute fracture. Sinuses/Orbits: Visualized sinuses are clear.  Unremarkable orbits. Other: No mastoid effusions. CT  CERVICAL SPINE FINDINGS Alignment: Normal. Skull base and vertebrae: No evidence of acute fracture. Vertebral body heights are maintained. Osteopenia. Soft tissues and spinal canal: No prevertebral fluid or swelling. No visible canal hematoma. Disc levels: Mild-to-moderate multilevel degenerative disc disease. Multilevel facet and uncovertebral hypertrophy. Upper chest: No acute findings. Other: Calcific atherosclerosis of the carotid arteries. IMPRESSION: 1. No evidence of acute intracranial abnormality. Chronic microvascular ischemic disease. 2. No evidence of acute fracture or traumatic malalignment in the cervical spine. Electronically Signed   By: Margaretha Sheffield MD   On: 08/12/2020 12:57    EKG: Independently reviewed. A fib  Assessment/Plan Syncopal episode AMS     - admit to obs, tele     - check orthostatics, UA     - had recent Echo that was unremarkable; will hold off on an additional one     - CTH was negative for bleed, now per her last d/c summ from 11/27 she's on xarelto for a fib. Let's hold xarelto for tonight, recheck [place] in AM, if ok, resume     - trp are negative     - she's actually A&O x 3, but with a general confusion during interview; this is likely baseline  A fib     - she's on coreg and xarelto per last d/c summ     - with frequent falls, AC may not be the best option for her     - hold xarelto tonight; reassess in AM for resuming tomorrow  AKI on CKD3b     - gental fluids; follow  Hyperkalemia     - lokelma, fluids, follow  DM2     - SSI, DM diet, glucose checks  COPD     - continue home O2, inhalers  Hypothyroidism     - continue synthroid  DVT prophylaxis: SCDs  Code Status: DNR  Family Communication: None at bedside.  Consults called: None  Status is: Observation  The patient remains OBS appropriate and will d/c before 2 midnights.  Dispo: The patient is from: SNF  Anticipated d/c is to: SNF              Anticipated d/c date  is: 1 day              Patient currently is not medically stable to d/c.  Jonnie Finner DO [facility]  If 7PM-7AM, please contact night-coverage [email or web address]  08/12/2020, 3:19 PM

## 2020-08-13 ENCOUNTER — Other Ambulatory Visit: Payer: Self-pay

## 2020-08-13 DIAGNOSIS — W19XXXA Unspecified fall, initial encounter: Secondary | ICD-10-CM

## 2020-08-13 DIAGNOSIS — Z66 Do not resuscitate: Secondary | ICD-10-CM | POA: Diagnosis present

## 2020-08-13 DIAGNOSIS — I10 Essential (primary) hypertension: Secondary | ICD-10-CM

## 2020-08-13 DIAGNOSIS — G9341 Metabolic encephalopathy: Secondary | ICD-10-CM | POA: Diagnosis present

## 2020-08-13 DIAGNOSIS — E785 Hyperlipidemia, unspecified: Secondary | ICD-10-CM | POA: Diagnosis not present

## 2020-08-13 DIAGNOSIS — W1811XA Fall from or off toilet without subsequent striking against object, initial encounter: Secondary | ICD-10-CM | POA: Diagnosis present

## 2020-08-13 DIAGNOSIS — E1159 Type 2 diabetes mellitus with other circulatory complications: Secondary | ICD-10-CM | POA: Diagnosis not present

## 2020-08-13 DIAGNOSIS — J449 Chronic obstructive pulmonary disease, unspecified: Secondary | ICD-10-CM | POA: Diagnosis present

## 2020-08-13 DIAGNOSIS — F32A Depression, unspecified: Secondary | ICD-10-CM | POA: Diagnosis present

## 2020-08-13 DIAGNOSIS — F0390 Unspecified dementia without behavioral disturbance: Secondary | ICD-10-CM | POA: Diagnosis present

## 2020-08-13 DIAGNOSIS — D6859 Other primary thrombophilia: Secondary | ICD-10-CM | POA: Diagnosis present

## 2020-08-13 DIAGNOSIS — R296 Repeated falls: Secondary | ICD-10-CM | POA: Diagnosis present

## 2020-08-13 DIAGNOSIS — E039 Hypothyroidism, unspecified: Secondary | ICD-10-CM

## 2020-08-13 DIAGNOSIS — I130 Hypertensive heart and chronic kidney disease with heart failure and stage 1 through stage 4 chronic kidney disease, or unspecified chronic kidney disease: Secondary | ICD-10-CM | POA: Diagnosis present

## 2020-08-13 DIAGNOSIS — I5031 Acute diastolic (congestive) heart failure: Secondary | ICD-10-CM | POA: Diagnosis present

## 2020-08-13 DIAGNOSIS — Z86711 Personal history of pulmonary embolism: Secondary | ICD-10-CM

## 2020-08-13 DIAGNOSIS — J9611 Chronic respiratory failure with hypoxia: Secondary | ICD-10-CM | POA: Diagnosis present

## 2020-08-13 DIAGNOSIS — R627 Adult failure to thrive: Secondary | ICD-10-CM | POA: Diagnosis present

## 2020-08-13 DIAGNOSIS — J438 Other emphysema: Secondary | ICD-10-CM | POA: Diagnosis not present

## 2020-08-13 DIAGNOSIS — L89321 Pressure ulcer of left buttock, stage 1: Secondary | ICD-10-CM | POA: Diagnosis present

## 2020-08-13 DIAGNOSIS — E875 Hyperkalemia: Secondary | ICD-10-CM | POA: Diagnosis present

## 2020-08-13 DIAGNOSIS — R55 Syncope and collapse: Secondary | ICD-10-CM | POA: Diagnosis present

## 2020-08-13 DIAGNOSIS — N1832 Chronic kidney disease, stage 3b: Secondary | ICD-10-CM | POA: Diagnosis present

## 2020-08-13 DIAGNOSIS — F419 Anxiety disorder, unspecified: Secondary | ICD-10-CM | POA: Diagnosis present

## 2020-08-13 DIAGNOSIS — S0181XA Laceration without foreign body of other part of head, initial encounter: Secondary | ICD-10-CM | POA: Diagnosis present

## 2020-08-13 DIAGNOSIS — I4891 Unspecified atrial fibrillation: Secondary | ICD-10-CM | POA: Diagnosis present

## 2020-08-13 DIAGNOSIS — K649 Unspecified hemorrhoids: Secondary | ICD-10-CM | POA: Diagnosis present

## 2020-08-13 DIAGNOSIS — N179 Acute kidney failure, unspecified: Secondary | ICD-10-CM | POA: Diagnosis present

## 2020-08-13 DIAGNOSIS — Z20822 Contact with and (suspected) exposure to covid-19: Secondary | ICD-10-CM | POA: Diagnosis present

## 2020-08-13 DIAGNOSIS — E873 Alkalosis: Secondary | ICD-10-CM | POA: Diagnosis present

## 2020-08-13 DIAGNOSIS — E1122 Type 2 diabetes mellitus with diabetic chronic kidney disease: Secondary | ICD-10-CM | POA: Diagnosis present

## 2020-08-13 DIAGNOSIS — K219 Gastro-esophageal reflux disease without esophagitis: Secondary | ICD-10-CM | POA: Diagnosis present

## 2020-08-13 LAB — CBC
HCT: 40.2 % (ref 36.0–46.0)
Hemoglobin: 12.1 g/dL (ref 12.0–15.0)
MCH: 28.2 pg (ref 26.0–34.0)
MCHC: 30.1 g/dL (ref 30.0–36.0)
MCV: 93.7 fL (ref 80.0–100.0)
Platelets: 253 10*3/uL (ref 150–400)
RBC: 4.29 MIL/uL (ref 3.87–5.11)
RDW: 17.2 % — ABNORMAL HIGH (ref 11.5–15.5)
WBC: 11.6 10*3/uL — ABNORMAL HIGH (ref 4.0–10.5)
nRBC: 0 % (ref 0.0–0.2)

## 2020-08-13 LAB — URINALYSIS, ROUTINE W REFLEX MICROSCOPIC
Bilirubin Urine: NEGATIVE
Glucose, UA: NEGATIVE mg/dL
Hgb urine dipstick: NEGATIVE
Ketones, ur: NEGATIVE mg/dL
Leukocytes,Ua: NEGATIVE
Nitrite: NEGATIVE
Protein, ur: NEGATIVE mg/dL
Specific Gravity, Urine: 1.018 (ref 1.005–1.030)
pH: 6 (ref 5.0–8.0)

## 2020-08-13 LAB — BASIC METABOLIC PANEL
Anion gap: 11 (ref 5–15)
BUN: 20 mg/dL (ref 8–23)
CO2: 31 mmol/L (ref 22–32)
Calcium: 8.7 mg/dL — ABNORMAL LOW (ref 8.9–10.3)
Chloride: 100 mmol/L (ref 98–111)
Creatinine, Ser: 1.11 mg/dL — ABNORMAL HIGH (ref 0.44–1.00)
GFR, Estimated: 50 mL/min — ABNORMAL LOW (ref >60)
Glucose, Bld: 113 mg/dL — ABNORMAL HIGH (ref 70–99)
Potassium: 4 mmol/L (ref 3.5–5.1)
Sodium: 142 mmol/L (ref 135–145)

## 2020-08-13 LAB — GLUCOSE, CAPILLARY
Glucose-Capillary: 86 mg/dL (ref 70–99)
Glucose-Capillary: 100 mg/dL — ABNORMAL HIGH (ref 70–99)
Glucose-Capillary: 156 mg/dL — ABNORMAL HIGH (ref 70–99)
Glucose-Capillary: 78 mg/dL (ref 70–99)
Glucose-Capillary: 112 mg/dL — ABNORMAL HIGH (ref 70–99)

## 2020-08-13 LAB — MAGNESIUM: Magnesium: 2 mg/dL (ref 1.7–2.4)

## 2020-08-13 MED ORDER — ALBUTEROL SULFATE HFA 108 (90 BASE) MCG/ACT IN AERS: 2.0000 | INHALATION_SPRAY | Freq: Four times a day (QID) | RESPIRATORY_TRACT | Status: DC | PRN

## 2020-08-13 MED ORDER — ORAL CARE MOUTH RINSE
15.0000 mL | Freq: Two times a day (BID) | OROMUCOSAL | Status: DC
  Administered 2020-08-13 – 2020-08-14 (×3): 15 mL via OROMUCOSAL

## 2020-08-13 MED ORDER — BASAGLAR KWIKPEN 100 UNIT/ML ~~LOC~~ SOPN: 5.0000 [IU] | PEN_INJECTOR | Freq: Every day | SUBCUTANEOUS | Status: DC

## 2020-08-13 MED ORDER — CHLORHEXIDINE GLUCONATE 0.12 % MT SOLN
15.0000 mL | Freq: Two times a day (BID) | OROMUCOSAL | Status: DC
  Administered 2020-08-13 – 2020-08-14 (×3): 15 mL via OROMUCOSAL
  Filled 2020-08-13 (×3): qty 15

## 2020-08-13 MED ORDER — NYSTATIN 100000 UNIT/GM EX POWD
1.0000 "application " | Freq: Two times a day (BID) | CUTANEOUS | Status: DC
  Administered 2020-08-13 – 2020-08-14 (×2): 1 via TOPICAL
  Filled 2020-08-13: qty 15

## 2020-08-13 MED ORDER — GABAPENTIN 300 MG PO CAPS
300.0000 mg | ORAL_CAPSULE | Freq: Every day | ORAL | Status: DC
Start: 2020-08-13 — End: 2020-08-13

## 2020-08-13 MED ORDER — BUPROPION HCL ER (XL) 300 MG PO TB24
300.0000 mg | ORAL_TABLET | Freq: Every day | ORAL | Status: DC
  Administered 2020-08-13 – 2020-08-14 (×2): 300 mg via ORAL
  Filled 2020-08-13 (×2): qty 1

## 2020-08-13 MED ORDER — PROPYLENE GLYCOL 0.6 % OP SOLN: 1.0000 [drp] | Freq: Four times a day (QID) | OPHTHALMIC | Status: DC | PRN

## 2020-08-13 MED ORDER — SODIUM CHLORIDE 0.9 % IV SOLN: Freq: Once | INTRAVENOUS | Status: AC

## 2020-08-13 MED ORDER — GABAPENTIN 300 MG PO CAPS
600.0000 mg | ORAL_CAPSULE | Freq: Every day | ORAL | Status: DC
  Administered 2020-08-13: 600 mg via ORAL
  Filled 2020-08-13: qty 2

## 2020-08-13 MED ORDER — VITAMIN D 50 MCG (2000 UT) PO TABS: 2000.0000 [IU] | ORAL_TABLET | Freq: Every day | ORAL | Status: DC

## 2020-08-13 MED ORDER — POLYETHYLENE GLYCOL 3350 17 G PO PACK: 17.0000 g | PACK | Freq: Every day | ORAL | Status: DC | PRN

## 2020-08-13 MED ORDER — INSULIN GLARGINE 100 UNIT/ML ~~LOC~~ SOLN
5.0000 [IU] | Freq: Every day | SUBCUTANEOUS | Status: DC
  Administered 2020-08-13: 5 [IU] via SUBCUTANEOUS
  Filled 2020-08-13 (×2): qty 0.05

## 2020-08-13 MED ORDER — ACETAMINOPHEN 500 MG PO TABS: 500.0000 mg | ORAL_TABLET | ORAL | Status: DC | PRN

## 2020-08-13 MED ORDER — SERTRALINE HCL 100 MG PO TABS
100.0000 mg | ORAL_TABLET | Freq: Every day | ORAL | Status: DC
  Administered 2020-08-13 – 2020-08-14 (×2): 100 mg via ORAL
  Filled 2020-08-13 (×2): qty 1

## 2020-08-13 MED ORDER — RIVAROXABAN 20 MG PO TABS
20.0000 mg | ORAL_TABLET | Freq: Every day | ORAL | Status: DC
  Administered 2020-08-13 – 2020-08-14 (×2): 20 mg via ORAL
  Filled 2020-08-13 (×2): qty 1

## 2020-08-13 MED ORDER — MECLIZINE HCL 25 MG PO TABS: 25.0000 mg | ORAL_TABLET | Freq: Every evening | ORAL | Status: DC | PRN

## 2020-08-13 MED ORDER — GABAPENTIN 300 MG PO CAPS
300.0000 mg | ORAL_CAPSULE | Freq: Two times a day (BID) | ORAL | Status: DC
  Administered 2020-08-14 (×2): 300 mg via ORAL
  Filled 2020-08-13 (×2): qty 1

## 2020-08-13 MED ORDER — ONDANSETRON HCL 4 MG PO TABS: 4.0000 mg | ORAL_TABLET | Freq: Three times a day (TID) | ORAL | Status: DC | PRN

## 2020-08-13 MED ORDER — SODIUM CHLORIDE 0.9 % IV SOLN: INTRAVENOUS | Status: AC

## 2020-08-13 MED ORDER — VITAMIN D 25 MCG (1000 UNIT) PO TABS
2000.0000 [IU] | ORAL_TABLET | Freq: Every day | ORAL | Status: DC
  Administered 2020-08-14: 2000 [IU] via ORAL
  Filled 2020-08-13: qty 2

## 2020-08-13 MED ORDER — DOCUSATE SODIUM 100 MG PO CAPS
100.0000 mg | ORAL_CAPSULE | Freq: Two times a day (BID) | ORAL | Status: DC
  Administered 2020-08-13 – 2020-08-14 (×2): 100 mg via ORAL
  Filled 2020-08-13 (×2): qty 1

## 2020-08-13 MED ORDER — ATORVASTATIN CALCIUM 10 MG PO TABS
10.0000 mg | ORAL_TABLET | Freq: Every day | ORAL | Status: DC
  Administered 2020-08-13: 10 mg via ORAL
  Filled 2020-08-13: qty 1

## 2020-08-13 MED ORDER — EZETIMIBE 10 MG PO TABS
10.0000 mg | ORAL_TABLET | Freq: Every day | ORAL | Status: DC
  Administered 2020-08-13 – 2020-08-14 (×2): 10 mg via ORAL
  Filled 2020-08-13 (×2): qty 1

## 2020-08-13 MED ORDER — POLYVINYL ALCOHOL 1.4 % OP SOLN
1.0000 [drp] | Freq: Four times a day (QID) | OPHTHALMIC | Status: DC | PRN
  Filled 2020-08-13: qty 15

## 2020-08-13 NOTE — Plan of Care (Signed)
  Problem: Education: Goal: Knowledge of General Education information will improve Description: Including pain rating scale, medication(s)/side effects and non-pharmacologic comfort measures Outcome: Progressing   Problem: Health Behavior/Discharge Planning: Goal: Ability to manage health-related needs will improve Outcome: Progressing   Problem: Clinical Measurements: Goal: Ability to maintain clinical measurements within normal limits will improve Outcome: Progressing Goal: Will remain free from infection Outcome: Progressing Goal: Diagnostic test results will improve Outcome: Progressing Goal: Respiratory complications will improve Outcome: Progressing Goal: Cardiovascular complication will be avoided Outcome: Progressing   Problem: Activity: Goal: Risk for activity intolerance will decrease Outcome: Progressing   Problem: Nutrition: Goal: Adequate nutrition will be maintained Outcome: Progressing   Problem: Coping: Goal: Level of anxiety will decrease Outcome: Progressing   Problem: Elimination: Goal: Will not experience complications related to bowel motility Outcome: Progressing Goal: Will not experience complications related to urinary retention Outcome: Progressing   Problem: Pain Managment: Goal: General experience of comfort will improve Outcome: Progressing   Problem: Safety: Goal: Ability to remain free from injury will improve Outcome: Progressing   Problem: Skin Integrity: Goal: Risk for impaired skin integrity will decrease Outcome: Progressing   Problem: Education: Goal: Ability to describe self-care measures that may prevent or decrease complications (Diabetes Survival Skills Education) will improve Outcome: Progressing Goal: Individualized Educational Video(s) Outcome: Progressing   Problem: Coping: Goal: Ability to adjust to condition or change in health will improve Outcome: Progressing   Problem: Fluid Volume: Goal: Ability to  maintain a balanced intake and output will improve Outcome: Progressing   Problem: Health Behavior/Discharge Planning: Goal: Ability to identify and utilize available resources and services will improve Outcome: Progressing Goal: Ability to manage health-related needs will improve Outcome: Progressing   Problem: Metabolic: Goal: Ability to maintain appropriate glucose levels will improve Outcome: Progressing   Problem: Nutritional: Goal: Maintenance of adequate nutrition will improve Outcome: Progressing Goal: Progress toward achieving an optimal weight will improve Outcome: Progressing   Problem: Skin Integrity: Goal: Risk for impaired skin integrity will decrease Outcome: Progressing   Problem: Tissue Perfusion: Goal: Adequacy of tissue perfusion will improve Outcome: Progressing   Problem: Education: Goal: Knowledge of condition and prescribed therapy will improve Outcome: Progressing   Problem: Cardiac: Goal: Will achieve and/or maintain adequate cardiac output Outcome: Progressing   Problem: Physical Regulation: Goal: Complications related to the disease process, condition or treatment will be avoided or minimized Outcome: Progressing

## 2020-08-13 NOTE — Progress Notes (Addendum)
PROGRESS NOTE  Dawn Morrow KWI:[id number] DOB: [birth year] DOA: 08/12/2020 PCP: Wenda Low, MD   LOS: 0 days   Brief narrative:  Dawn Morrow is a 84 y.o. female with medical history significant of dementia, frequent falls, COPD, DM, a fib. presented to the hospital after sustaining syncopal episode and fall.  Patient tried to go to the bathroom by herself but after she had a bowel movement she passed out she does not remember falling and hitting her head.  EMS was called and patient was brought into the hospital.  In the ED, patient had a CT scan of the head which was negative including CT neck.  She had sustained a laceration in the forehead and received sutures in the ED.  Patient was then considered for observation in the hospital.   Assessment/Plan:  Principal Problem:   Syncope Active Problems:   Hypothyroidism   Diabetes ([place])   Dyslipidemia   HTN (hypertension)   COPD (chronic obstructive pulmonary disease) (HCC)   History of pulmonary embolism   Fall   Pressure injury of skin  Syncopal episode Patient tested positive for orthostatics today.  Initially was thought to be vasovagal in nature as well.  We will start the patient on IV fluids.  Patient had echocardiogram recently which was unremarkable.  Hold off with antihypertensives for now.  Was on Coreg at home.  Will hold for now.  CT head scan including neck scan was negative for acute findings.  We will add orthostatic vitals every shift for now.  Fall with forehead laceration.  Status post suture placement.  Continue dressing.  Pending physical therapy evaluation..  Acute confusion on presentation.  Status post fall and trauma to the head..  Currently at baseline.  Atrial fibrillation, history of pulmonary bolus of    Rate controlled at this time.  On Coreg and Xarelto as outpatient.  Resume Xarelto.  History of falls but benefits of  anticoagulation probably outweighs risk.  AKI on CKD3b  Continue IV fluids.   Received gentle yesterday.  Improved renal function to 1.1 today.  Creatinine was 1.6 on presentation.  Hyperkalemia Mild on presentation.  Has improved for today.  Continue to monitor closely.  DM2 Continue sliding scale insulin, Accu-Cheks, diabetic diet.  Restart long acting insulin.  COPD     - continue home O2, inhalers.  Not in acute exacerbation  Hypothyroidism     - continue synthroid  DVT prophylaxis: SCDs Start: 08/12/20 1806   Code Status: DNR  Family Communication: None today  Status is: Observation  The patient will require care spanning > 2 midnights and should be moved to inpatient because: IV treatments appropriate due to intensity of illness or inability to take PO, Inpatient level of care appropriate due to severity of illness and Orthostatic hypotension, ongoing IV fluids.  Pending PT evaluation.  Dispo: The patient is from: SNF              Anticipated d/c is to: SNF, pending PT evaluation.              Anticipated d/c date is: 1-2 days              Patient currently is not medically stable to d/c.  Consultants:  None  Procedures:  Forehead laceration sutured  Antibiotics:  . None  Anti-infectives (From admission, onward)   None     Subjective: Today, patient was seen and examined at bedside.  Patient denies any nausea vomiting abdominal pain.  Was orthostatic on evaluation.  Denies chest pain, dizziness or lightheadedness  Objective: Vitals:   08/13/20 1230 08/13/20 1340  BP:  134/75  Pulse:  80  Resp:    Temp:  97.8 F (36.6 C)  SpO2: 95% 94%    Intake/Output Summary (Last 24 hours) at 08/13/2020 1612 Last data filed at 08/13/2020 0600 Gross per 24 hour  Intake --  Output 600 ml  Net -600 ml   Filed Weights   08/12/20 2315 08/13/20 0500  Weight: 82.3 kg 83.3 kg   Body mass index is 30.1 kg/m.   Physical Exam: GENERAL: Patient is alert awake and oriented. Not in obvious distress. HENT: No scleral pallor or icterus.  Pupils equally reactive to light. Oral mucosa is moist.  Acute laceration to the mid forehead status post suture NECK: is supple, no gross swelling noted. CHEST: Clear to auscultation. No crackles or wheezes.  Diminished breath sounds bilaterally. CVS: S1 and S2 heard, no murmur. Regular rate and rhythm.  ABDOMEN: Soft, non-tender, bowel sounds are present. EXTREMITIES: No edema. CNS: Cranial nerves are intact. No focal motor deficits. SKIN: warm and dry without rashes.  Data Review: I have personally reviewed the following laboratory data and studies,  CBC: Recent Labs  Lab 08/12/20 1350 08/13/20 0832  WBC 10.6* 11.6*  HGB 12.7 12.1  HCT 42.1 40.2  MCV 94.0 93.7  PLT 252 366   Basic Metabolic Panel: Recent Labs  Lab 08/12/20 1350 08/13/20 0832  NA 144 142  K 5.6* 4.0  CL 99 100  CO2 34* 31  GLUCOSE 118* 113*  BUN 23 20  CREATININE 1.60* 1.11*  CALCIUM 9.6 8.7*  MG  --  2.0   Liver Function Tests: Recent Labs  Lab 08/12/20 1350  AST 23  ALT 20  ALKPHOS 75  BILITOT 0.6  PROT 6.9  ALBUMIN 3.5   No results for input(s): LIPASE, AMYLASE in the last 168 hours. No results for input(s): AMMONIA in the last 168 hours. Cardiac Enzymes: No results for input(s): CKTOTAL, CKMB, CKMBINDEX, TROPONINI in the last 168 hours. BNP (last 3 results) Recent Labs    07/31/20 0359 08/01/20 0149 08/02/20 0229  BNP 307.6* 221.7* 2,760.5*    ProBNP (last 3 results) No results for input(s): PROBNP in the last 8760 hours.  CBG: Recent Labs  Lab 08/12/20 2145 08/13/20 0529 08/13/20 0751 08/13/20 1220  GLUCAP 268* 86 100* 156*   Recent Results (from the past 240 hour(s))  Resp Panel by RT-PCR (Flu A&B, Covid) Nasopharyngeal Swab     Status: None   Collection Time: 08/12/20  2:58 PM   Specimen: Nasopharyngeal Swab; Nasopharyngeal(NP) swabs in vial transport medium  Result Value Ref Range Status   SARS Coronavirus 2 by RT PCR NEGATIVE NEGATIVE Final    Comment:  (NOTE) SARS-CoV-2 target nucleic acids are NOT DETECTED.  The SARS-CoV-2 RNA is generally detectable in upper respiratory specimens during the acute phase of infection. The lowest concentration of SARS-CoV-2 viral copies this assay can detect is 138 copies/mL. A negative result does not preclude SARS-Cov-2 infection and should not be used as the sole basis for treatment or other patient management decisions. A negative result may occur with  improper specimen collection/handling, submission of specimen other than nasopharyngeal swab, presence of viral mutation(s) within the areas targeted by this assay, and inadequate number of viral copies(<138 copies/mL). A negative result must be combined with clinical observations, patient history, and epidemiological information. The expected result is Negative.  Fact  Sheet for Patients:  EntrepreneurPulse.com.au  Fact Sheet for Healthcare Providers:  IncredibleEmployment.be  This test is no t yet approved or cleared by the [place] FDA and  has been authorized for detection and/or diagnosis of SARS-CoV-2 by FDA under an Emergency Use Authorization (EUA). This EUA will remain  in effect (meaning this test can be used) for the duration of the COVID-19 declaration under Section 564(b)(1) of the Act, 21 U.S.C.section 360bbb-3(b)(1), unless the authorization is terminated  or revoked sooner.       Influenza A by PCR NEGATIVE NEGATIVE Final   Influenza B by PCR NEGATIVE NEGATIVE Final    Comment: (NOTE) The Xpert Xpress SARS-CoV-2/FLU/RSV plus assay is intended as an aid in the diagnosis of influenza from Nasopharyngeal swab specimens and should not be used as a sole basis for treatment. Nasal washings and aspirates are unacceptable for Xpert Xpress SARS-CoV-2/FLU/RSV testing.  Fact Sheet for Patients: EntrepreneurPulse.com.au  Fact Sheet for Healthcare  Providers: IncredibleEmployment.be  This test is not yet approved or cleared by the [place] FDA and has been authorized for detection and/or diagnosis of SARS-CoV-2 by FDA under an Emergency Use Authorization (EUA). This EUA will remain in effect (meaning this test can be used) for the duration of the COVID-19 declaration under Section 564(b)(1) of the Act, 21 U.S.C. section 360bbb-3(b)(1), unless the authorization is terminated or revoked.  Performed at [facility], [place] [address] [place], [place] [address]      Studies: DG Chest 2 View  Result Date: 08/12/2020 CLINICAL DATA:  Un witnessed fall. EXAM: CHEST - 2 VIEW COMPARISON:  July 30, 2020. FINDINGS: Enlarged cardiac silhouette. Aortic atherosclerosis. Left basilar opacities, improved from prior. No visible pleural effusions or pneumothorax. No evidence of acute osseous abnormality. IMPRESSION: Improved left basilar opacities, which could represent atelectasis, aspiration, and/or pneumonia. Electronically Signed   By: Margaretha Sheffield MD   On: 08/12/2020 12:25   CT HEAD WO CONTRAST  Result Date: 08/12/2020 CLINICAL DATA:  Un witnessed fall. EXAM: CT HEAD WITHOUT CONTRAST CT CERVICAL SPINE WITHOUT CONTRAST TECHNIQUE: Multidetector CT imaging of the head and cervical spine was performed following the standard protocol without intravenous contrast. Multiplanar CT image reconstructions of the cervical spine were also generated. COMPARISON:  Cervical radiographs 04/09/2010. CT head July 11, 2020. FINDINGS: CT HEAD FINDINGS Brain: No evidence of acute infarction, hemorrhage, hydrocephalus, extra-axial collection or mass lesion/mass effect. Similar patchy white matter hypoattenuation, which is nonspecific but most likely related to chronic microvascular ischemic disease. Vascular: Calcific atherosclerosis. Skull: No acute fracture. Sinuses/Orbits: Visualized sinuses are clear.  Unremarkable  orbits. Other: No mastoid effusions. CT CERVICAL SPINE FINDINGS Alignment: Normal. Skull base and vertebrae: No evidence of acute fracture. Vertebral body heights are maintained. Osteopenia. Soft tissues and spinal canal: No prevertebral fluid or swelling. No visible canal hematoma. Disc levels: Mild-to-moderate multilevel degenerative disc disease. Multilevel facet and uncovertebral hypertrophy. Upper chest: No acute findings. Other: Calcific atherosclerosis of the carotid arteries. IMPRESSION: 1. No evidence of acute intracranial abnormality. Chronic microvascular ischemic disease. 2. No evidence of acute fracture or traumatic malalignment in the cervical spine. Electronically Signed   By: Margaretha Sheffield MD   On: 08/12/2020 12:57   CT CERVICAL SPINE WO CONTRAST  Result Date: 08/12/2020 CLINICAL DATA:  Un witnessed fall. EXAM: CT HEAD WITHOUT CONTRAST CT CERVICAL SPINE WITHOUT CONTRAST TECHNIQUE: Multidetector CT imaging of the head and cervical spine was performed following the standard protocol without intravenous contrast. Multiplanar CT image reconstructions of  the cervical spine were also generated. COMPARISON:  Cervical radiographs 04/09/2010. CT head July 11, 2020. FINDINGS: CT HEAD FINDINGS Brain: No evidence of acute infarction, hemorrhage, hydrocephalus, extra-axial collection or mass lesion/mass effect. Similar patchy white matter hypoattenuation, which is nonspecific but most likely related to chronic microvascular ischemic disease. Vascular: Calcific atherosclerosis. Skull: No acute fracture. Sinuses/Orbits: Visualized sinuses are clear.  Unremarkable orbits. Other: No mastoid effusions. CT CERVICAL SPINE FINDINGS Alignment: Normal. Skull base and vertebrae: No evidence of acute fracture. Vertebral body heights are maintained. Osteopenia. Soft tissues and spinal canal: No prevertebral fluid or swelling. No visible canal hematoma. Disc levels: Mild-to-moderate multilevel degenerative disc  disease. Multilevel facet and uncovertebral hypertrophy. Upper chest: No acute findings. Other: Calcific atherosclerosis of the carotid arteries. IMPRESSION: 1. No evidence of acute intracranial abnormality. Chronic microvascular ischemic disease. 2. No evidence of acute fracture or traumatic malalignment in the cervical spine. Electronically Signed   By: Margaretha Sheffield MD   On: 08/12/2020 12:57      Flora Lipps, MD  [facility] 08/13/2020

## 2020-08-14 DIAGNOSIS — J438 Other emphysema: Secondary | ICD-10-CM

## 2020-08-14 DIAGNOSIS — N1831 Chronic kidney disease, stage 3a: Secondary | ICD-10-CM

## 2020-08-14 DIAGNOSIS — I482 Chronic atrial fibrillation, unspecified: Secondary | ICD-10-CM

## 2020-08-14 DIAGNOSIS — R627 Adult failure to thrive: Secondary | ICD-10-CM

## 2020-08-14 DIAGNOSIS — Z9981 Dependence on supplemental oxygen: Secondary | ICD-10-CM

## 2020-08-14 DIAGNOSIS — Z794 Long term (current) use of insulin: Secondary | ICD-10-CM

## 2020-08-14 DIAGNOSIS — E875 Hyperkalemia: Secondary | ICD-10-CM

## 2020-08-14 DIAGNOSIS — D6869 Other thrombophilia: Secondary | ICD-10-CM

## 2020-08-14 DIAGNOSIS — N179 Acute kidney failure, unspecified: Secondary | ICD-10-CM

## 2020-08-14 DIAGNOSIS — E119 Type 2 diabetes mellitus without complications: Secondary | ICD-10-CM

## 2020-08-14 DIAGNOSIS — J9611 Chronic respiratory failure with hypoxia: Secondary | ICD-10-CM

## 2020-08-14 LAB — COMPREHENSIVE METABOLIC PANEL
ALT: 17 U/L (ref 0–44)
AST: 23 U/L (ref 15–41)
Albumin: 3.1 g/dL — ABNORMAL LOW (ref 3.5–5.0)
Alkaline Phosphatase: 68 U/L (ref 38–126)
Anion gap: 11 (ref 5–15)
BUN: 17 mg/dL (ref 8–23)
CO2: 26 mmol/L (ref 22–32)
Calcium: 8.4 mg/dL — ABNORMAL LOW (ref 8.9–10.3)
Chloride: 104 mmol/L (ref 98–111)
Creatinine, Ser: 1.19 mg/dL — ABNORMAL HIGH (ref 0.44–1.00)
GFR, Estimated: 46 mL/min — ABNORMAL LOW (ref >60)
Glucose, Bld: 125 mg/dL — ABNORMAL HIGH (ref 70–99)
Potassium: 3.8 mmol/L (ref 3.5–5.1)
Sodium: 141 mmol/L (ref 135–145)
Total Bilirubin: 0.5 mg/dL (ref 0.3–1.2)
Total Protein: 6.3 g/dL — ABNORMAL LOW (ref 6.5–8.1)

## 2020-08-14 LAB — CBC
HCT: 38.4 % (ref 36.0–46.0)
Hemoglobin: 11.7 g/dL — ABNORMAL LOW (ref 12.0–15.0)
MCH: 28.4 pg (ref 26.0–34.0)
MCHC: 30.5 g/dL (ref 30.0–36.0)
MCV: 93.2 fL (ref 80.0–100.0)
Platelets: 235 10*3/uL (ref 150–400)
RBC: 4.12 MIL/uL (ref 3.87–5.11)
RDW: 17.5 % — ABNORMAL HIGH (ref 11.5–15.5)
WBC: 11.2 10*3/uL — ABNORMAL HIGH (ref 4.0–10.5)
nRBC: 0 % (ref 0.0–0.2)

## 2020-08-14 LAB — MAGNESIUM: Magnesium: 1.8 mg/dL (ref 1.7–2.4)

## 2020-08-14 LAB — GLUCOSE, CAPILLARY
Glucose-Capillary: 102 mg/dL — ABNORMAL HIGH (ref 70–99)
Glucose-Capillary: 124 mg/dL — ABNORMAL HIGH (ref 70–99)
Glucose-Capillary: 152 mg/dL — ABNORMAL HIGH (ref 70–99)
Glucose-Capillary: 137 mg/dL — ABNORMAL HIGH (ref 70–99)

## 2020-08-14 LAB — SARS CORONAVIRUS 2 BY RT PCR (HOSPITAL ORDER, PERFORMED IN ~~LOC~~ HOSPITAL LAB): SARS Coronavirus 2: NEGATIVE

## 2020-08-14 MED ORDER — CARVEDILOL 12.5 MG PO TABS
12.5000 mg | ORAL_TABLET | Freq: Two times a day (BID) | ORAL | Status: DC
  Administered 2020-08-14: 12.5 mg via ORAL
  Filled 2020-08-14: qty 1

## 2020-08-14 NOTE — Discharge Summary (Addendum)
Discharge Summary  Dawn Morrow JEH:[id number] DOB: September 18, 1935  PCP: Wenda Low, MD  Admit date: 08/12/2020 Discharge date: 08/14/2020  Time spent: 65mins, more than 50% time spent on coordination of care.  Recommendations for Outpatient Follow-up:  1. F/u withSNF MD for hospital discharge follow up, repeat cbc/bmp at follow up 2. Fall precaution at SNF 3. Palliative care continue to follow at SNF  Discharge Diagnoses:  Active Hospital Problems   Diagnosis Date Noted  . Syncope 09/18/2017  . Pressure injury of skin 07/31/2020  . Fall 07/11/2020  . History of pulmonary embolism 03/26/2020  . Diabetes ([place]) 06/04/2009  . Hypothyroidism 09/13/2007  . Dyslipidemia 09/13/2007  . HTN (hypertension) 09/11/2006  . COPD (chronic obstructive pulmonary disease) ([place]) 09/11/2006    Resolved Hospital Problems  No resolved problems to display.    Discharge Condition: stable  Diet recommendation: heart healthy/carb modified, encourage hydration  soft diet, thin liquids continue aspiration precaution Speech to monitor swallow  Filed Weights   08/12/20 2315 08/13/20 0500 08/14/20 0500  Weight: 82.3 kg 83.3 kg 84 kg    History of present illness: (per admitting provider Dr Marylyn Ishihara ) Chief Complaint: Syncope  HPI: Dawn Morrow is a 84 y.o. female with medical history significant of dementia, frequent fall, COPD, DM, a fib. Presenting after syncopal episode and fall. She reports that she tried to go to the bathroom by herself "just to show them she could do it." She was about finished with a BM when she passed out. She does not remember the fall and hitting her head. The staff found her and called for EMS. She denies any prodromal symptoms.   ED Course: CTH was negative in the ED. CT of the neck was also negative for anything acute. She was getting sutures with the EDP when she seem to have some acute confusion. [facility] was called for admission.   Hospital Course:  Principal  Problem:   Syncope Active Problems:   Hypothyroidism   Diabetes ([place])   Dyslipidemia   HTN (hypertension)   COPD (chronic obstructive pulmonary disease) ([facility])   History of pulmonary embolism   Fall   Pressure injury of skin   Syncopal episode Patient tested positive for orthostatics and aki ,  Initially was thought to be vasovagal in nature as well.  she is treated with  IV fluids.   CT head scan including neck scan was negative for acute findings. Patient had echocardiogram recently which was unremarkable.   Coreg at home held initially, resumed at discharge  Fall with forehead laceration.  Status post suture placement.  Continue dressing.   Acute confusion on presentation.  Status post fall and trauma to the head..  Currently at baseline.  AKI on CKD3b  UA no infection BUN 23 creatinine 1.6 on presentation , BUN 17 creatinine 1.1 after hydration  Improved Renal dosing meds  Hyperkalemia potassium 5.6 on presentation Likely due to dehydration and AKI, this has resolved, potassium 3.8 today  Atrial fibrillation, acquired thrombophilia ,  history of CVA , history of PE Rate controlled at this time.    Continue Coreg and Xarelto    History of falls but benefits of  anticoagulation probably outweighs risk.  Fall precaution at skilled nursing facility Palliative care continue to follow patient at skilled nursing facility for goals of care discussion   Insulin-dependent DM2 Controlled, A1c 5.1 A.m. blood glucose 125 Continue Lantus 5 units nightly May eventually come in taking of long-acting insulin and just need sliding  scale To be determined by skilled nursing facility MD   COPD, chronic hypoxic respiratory failure on home 02 3liter at baseline - continue home O2, inhalers.  Not in acute exacerbation  Hypothyroidism - continue synthroid  FTT/frequent hospitalization Benefit from palliative care following at skilled nursing facility  Code Status:  DNR  Family Communication:  Nephew over the phone   Procedures:  Suture of forehead laceration in the ED  Consultations:  None  Discharge Exam: BP (!) 148/87 (BP Location: Right Arm)   Pulse 82   Temp 97.9 F (36.6 C) (Oral)   Resp 20   Ht 5' 5.5" (1.664 m)   Wt 84 kg   SpO2 94%   BMI 30.35 kg/m   General: Alert, interactive and pleasant, baseline memory deficit, knows the year and the president Cardiovascular: I RRR Respiratory: Normal respiratory effort  Discharge Instructions You were cared for by a hospitalist during your hospital stay. If you have any questions about your discharge medications or the care you received while you were in the hospital after you are discharged, you can call the unit and asked to speak with the hospitalist on call if the hospitalist that took care of you is not available. Once you are discharged, your primary care physician will handle any further medical issues. Please note that NO REFILLS for any discharge medications will be authorized once you are discharged, as it is imperative that you return to your primary care physician (or establish a relationship with a primary care physician if you do not have one) for your aftercare needs so that they can reassess your need for medications and monitor your lab values.  Discharge Instructions    Diet - low sodium heart healthy   Complete by: As directed    Discharge wound care:   Complete by: As directed    Pressure off loading, check skin (buttock) qshift   Increase activity slowly   Complete by: As directed      Allergies as of 08/14/2020      Reactions   Penicillins Anaphylaxis   Codeine Other (See Comments)   Listed on MAR   Sulfonylureas Other (See Comments)   Listed on MAR   Sulfonamide Derivatives Other (See Comments)   Unknown reaction      Medication List    TAKE these medications   acetaminophen 500 MG tablet Commonly known as: TYLENOL Take 500 mg by mouth every 4 (four)  hours as needed for moderate pain (pain).   albuterol 108 (90 Base) MCG/ACT inhaler Commonly known as: VENTOLIN HFA Inhale 2 puffs into the lungs every 6 (six) hours as needed for wheezing or shortness of breath.   atorvastatin 10 MG tablet Commonly known as: LIPITOR Take 1 tablet (10 mg total) by mouth at bedtime.   Basaglar KwikPen 100 UNIT/ML Inject 5 Units into the skin at bedtime.   buPROPion 300 MG 24 hr tablet Commonly known as: WELLBUTRIN XL Take 1 tablet (300 mg total) by mouth daily.   carvedilol 12.5 MG tablet Commonly known as: COREG Take 1 tablet (12.5 mg total) by mouth 2 (two) times daily with a meal.   docusate sodium 100 MG capsule Commonly known as: COLACE Take 100 mg by mouth 2 (two) times daily.   ezetimibe 10 MG tablet Commonly known as: ZETIA Take 10 mg by mouth daily.   gabapentin 300 MG capsule Commonly known as: NEURONTIN Take 1 cap in AM, 1 cap at noon, 2 caps at bedtime What changed:  how much to take  how to take this  when to take this  additional instructions   ketoconazole 2 % shampoo Commonly known as: NIZORAL Apply 1 application topically See admin instructions. Use topically to shampoo hair twice weekly - Tuesday and Friday   levothyroxine 88 MCG tablet Commonly known as: SYNTHROID Take 88 mcg by mouth daily before breakfast.   meclizine 25 MG tablet Commonly known as: ANTIVERT Take 25 mg by mouth at bedtime as needed for dizziness.   multivitamin with minerals Tabs tablet Take 1 tablet by mouth daily.   nystatin powder Generic drug: nystatin Apply 1 application topically 2 (two) times daily.   ondansetron 4 MG tablet Commonly known as: ZOFRAN Take 4 mg by mouth every 8 (eight) hours as needed for nausea or vomiting.   OXYGEN Inhale 3 L into the lungs continuous.   polyethylene glycol 17 g packet Commonly known as: MIRALAX / GLYCOLAX Take 17 g by mouth daily as needed for mild constipation or moderate  constipation.   rivaroxaban 20 MG Tabs tablet Commonly known as: XARELTO Take 20 mg by mouth daily with supper. What changed: Another medication with the same name was removed. Continue taking this medication, and follow the directions you see here.   sertraline 100 MG tablet Commonly known as: ZOLOFT Take 1 tablet (100 mg total) by mouth daily.   Systane Balance 0.6 % Soln Generic drug: Propylene Glycol Place 1 drop into both eyes 4 (four) times daily as needed (for dry eyes).   Vitamin D 50 MCG (2000 UT) tablet Take 2,000 Units by mouth daily.            Discharge Care Instructions  (From admission, onward)         Start     Ordered   08/14/20 0000  Discharge wound care:       Comments: Pressure off loading, check skin (buttock) qshift   08/14/20 1034         Allergies  Allergen Reactions  . Penicillins Anaphylaxis  . Codeine Other (See Comments)    Listed on MAR  . Sulfonylureas Other (See Comments)    Listed on MAR  . Sulfonamide Derivatives Other (See Comments)    Unknown reaction    Contact information for after-discharge care    Destination    [facility] Preferred SNF .   Service: Skilled Nursing Contact information: [place] [phone]                   The results of significant diagnostics from this hospitalization (including imaging, microbiology, ancillary and laboratory) are listed below for reference.    Significant Diagnostic Studies: DG Chest 2 View  Result Date: 08/12/2020 CLINICAL DATA:  Un witnessed fall. EXAM: CHEST - 2 VIEW COMPARISON:  July 30, 2020. FINDINGS: Enlarged cardiac silhouette. Aortic atherosclerosis. Left basilar opacities, improved from prior. No visible pleural effusions or pneumothorax. No evidence of acute osseous abnormality. IMPRESSION: Improved left basilar opacities, which could represent atelectasis, aspiration, and/or pneumonia.  Electronically Signed   By: Margaretha Sheffield MD   On: 08/12/2020 12:25   CT HEAD WO CONTRAST  Result Date: 08/12/2020 CLINICAL DATA:  Un witnessed fall. EXAM: CT HEAD WITHOUT CONTRAST CT CERVICAL SPINE WITHOUT CONTRAST TECHNIQUE: Multidetector CT imaging of the head and cervical spine was performed following the standard protocol without intravenous contrast. Multiplanar CT image reconstructions of the cervical spine were also generated. COMPARISON:  Cervical radiographs 04/09/2010.  CT head July 11, 2020. FINDINGS: CT HEAD FINDINGS Brain: No evidence of acute infarction, hemorrhage, hydrocephalus, extra-axial collection or mass lesion/mass effect. Similar patchy white matter hypoattenuation, which is nonspecific but most likely related to chronic microvascular ischemic disease. Vascular: Calcific atherosclerosis. Skull: No acute fracture. Sinuses/Orbits: Visualized sinuses are clear.  Unremarkable orbits. Other: No mastoid effusions. CT CERVICAL SPINE FINDINGS Alignment: Normal. Skull base and vertebrae: No evidence of acute fracture. Vertebral body heights are maintained. Osteopenia. Soft tissues and spinal canal: No prevertebral fluid or swelling. No visible canal hematoma. Disc levels: Mild-to-moderate multilevel degenerative disc disease. Multilevel facet and uncovertebral hypertrophy. Upper chest: No acute findings. Other: Calcific atherosclerosis of the carotid arteries. IMPRESSION: 1. No evidence of acute intracranial abnormality. Chronic microvascular ischemic disease. 2. No evidence of acute fracture or traumatic malalignment in the cervical spine. Electronically Signed   By: Margaretha Sheffield MD   On: 08/12/2020 12:57   CT HEAD WO CONTRAST  Result Date: 07/31/2020 CLINICAL DATA:  Mental status change of unknown cause. History of hypertension and diabetes. EXAM: CT HEAD WITHOUT CONTRAST TECHNIQUE: Contiguous axial images were obtained from the base of the skull through the vertex without  intravenous contrast. COMPARISON:  07/16/2020 FINDINGS: Brain: Mild cerebral atrophy. Mild ventricular dilatation consistent with central atrophy. Prominent low-attenuation changes throughout the deep white matter consistent with small vessel ischemia. No mass-effect or midline shift. No abnormal extra-axial fluid collections. The gray-white matter junctions are distinct. Basal cisterns are not effaced. No acute intracranial hemorrhage. Vascular: Moderate intracranial arterial vascular calcifications. Skull: The calvarium appears intact. No acute depressed skull fractures. Sinuses/Orbits: Paranasal sinuses and mastoid air cells are clear. Other: Dense subcutaneous soft tissue nodule over the left parietal convexity likely representing a pilonidal cyst. IMPRESSION: 1. No acute intracranial abnormalities. 2. Chronic atrophy and small vessel ischemia. Electronically Signed   By: Lucienne Capers M.D.   On: 07/31/2020 00:45   CT HEAD WO CONTRAST  Result Date: 07/16/2020 CLINICAL DATA:  84 year old female with altered mental status. Neurologic deficit. EXAM: CT HEAD WITHOUT CONTRAST TECHNIQUE: Contiguous axial images were obtained from the base of the skull through the vertex without intravenous contrast. COMPARISON:  Head CT 07/11/2020.  Brain MRI 12/16/2019. FINDINGS: Brain: Mild motion artifact today. No midline shift, mass effect, or evidence of intracranial mass lesion. Stable ventricle size and configuration. No ventriculomegaly. No acute intracranial hemorrhage identified. Advanced bilateral cerebral white matter hypodensity and including involvement of the deep white matter capsules. This appears similar to the prior MRI. Associated chronic heterogeneity of the bilateral deep gray nuclei. Stable gray-white matter differentiation throughout the brain. No cortically based acute infarct identified. Vascular: Calcified atherosclerosis at the skull base. No suspicious intracranial vascular hyperdensity. Skull:  Mild motion artifact. No acute osseous abnormality identified. Sinuses/Orbits: Stable mild left mastoid effusion. Possible small volume retained secretions in the nasopharynx. Left tympanic cavity, other paranasal sinuses and mastoids remain clear. Other: Stable orbit and scalp soft tissues. IMPRESSION: 1. Mild motion artifact today. No acute or evolving infarct identified. Underlying severe chronic small vessel disease. 2. Stable mild left mastoid effusion, likely postinflammatory. Electronically Signed   By: Genevie Ann M.D.   On: 07/16/2020 16:57   CT CERVICAL SPINE WO CONTRAST  Result Date: 08/12/2020 CLINICAL DATA:  Un witnessed fall. EXAM: CT HEAD WITHOUT CONTRAST CT CERVICAL SPINE WITHOUT CONTRAST TECHNIQUE: Multidetector CT imaging of the head and cervical spine was performed following the standard protocol without intravenous contrast. Multiplanar CT image reconstructions of the cervical spine were  also generated. COMPARISON:  Cervical radiographs 04/09/2010. CT head July 11, 2020. FINDINGS: CT HEAD FINDINGS Brain: No evidence of acute infarction, hemorrhage, hydrocephalus, extra-axial collection or mass lesion/mass effect. Similar patchy white matter hypoattenuation, which is nonspecific but most likely related to chronic microvascular ischemic disease. Vascular: Calcific atherosclerosis. Skull: No acute fracture. Sinuses/Orbits: Visualized sinuses are clear.  Unremarkable orbits. Other: No mastoid effusions. CT CERVICAL SPINE FINDINGS Alignment: Normal. Skull base and vertebrae: No evidence of acute fracture. Vertebral body heights are maintained. Osteopenia. Soft tissues and spinal canal: No prevertebral fluid or swelling. No visible canal hematoma. Disc levels: Mild-to-moderate multilevel degenerative disc disease. Multilevel facet and uncovertebral hypertrophy. Upper chest: No acute findings. Other: Calcific atherosclerosis of the carotid arteries. IMPRESSION: 1. No evidence of acute intracranial  abnormality. Chronic microvascular ischemic disease. 2. No evidence of acute fracture or traumatic malalignment in the cervical spine. Electronically Signed   By: Margaretha Sheffield MD   On: 08/12/2020 12:57   DG Chest Port 1 View  Result Date: 07/30/2020 CLINICAL DATA:  Less responsive EXAM: PORTABLE CHEST 1 VIEW COMPARISON:  07/09/2020 FINDINGS: Mild cardiomegaly with central vascular congestion. Small left-sided pleural effusion. Left basilar consolidation. Aortic atherosclerosis. No pneumothorax. IMPRESSION: Mild cardiomegaly with central vascular congestion and small left pleural effusion. Left basilar consolidation. Electronically Signed   By: Donavan Foil M.D.   On: 07/30/2020 23:49   ECHOCARDIOGRAM COMPLETE  Result Date: 07/31/2020    ECHOCARDIOGRAM REPORT   Patient Name:   Dawn Morrow Date of Exam: 07/31/2020 Medical Rec #:  [id number]    Height:       65.5 in Accession #:    [id number]   Weight:       188.1 lb Date of Birth:  [birth year]    BSA:          1.938 m Patient Age:    6 years     BP:           117/67 mmHg Patient Gender: F            HR:           86 bpm. Exam Location:  Inpatient Procedure: 2D Echo, Color Doppler, Cardiac Doppler and Intracardiac            Opacification Agent Indications:    CHF-Acute Diastolic 517.61 / Y07.37  History:        Patient has prior history of Echocardiogram examinations, most                 recent 09/17/2017. Stroke and COPD; Risk Factors:Hypertension,                 Diabetes, Dyslipidemia and Former Smoker. Asthma. Chronic kidney                 disease. Sepsis secondary to UTI. Acute metabolic                 encephalopathy.  Sonographer:    Darlina Sicilian RDCS Referring Phys: [id number] [place]  1. Left ventricular ejection fraction, by estimation, is 55 to 60%. The left ventricle has normal function. The left ventricle has no regional wall motion abnormalities. Left ventricular diastolic function could not be evaluated.  2. Right  ventricular systolic function was not well visualized. The right ventricular size is not well visualized. There is normal pulmonary artery systolic pressure.  3. A small pericardial effusion is present.  4. The mitral valve is normal in structure. Trivial mitral  valve regurgitation. No evidence of mitral stenosis. Moderate mitral annular calcification.  5. The aortic valve is tricuspid. There is mild calcification of the aortic valve. There is mild thickening of the aortic valve. Aortic valve regurgitation is not visualized. Mild aortic valve sclerosis is present, with no evidence of aortic valve stenosis.  6. The inferior vena cava is normal in size with <50% respiratory variability, suggesting right atrial pressure of 8 mmHg. Comparison(s): No significant change from prior study. Conclusion(s)/Recommendation(s): Otherwise normal echocardiogram, with minor abnormalities described in the report. FINDINGS  Left Ventricle: Left ventricular ejection fraction, by estimation, is 55 to 60%. The left ventricle has normal function. The left ventricle has no regional wall motion abnormalities. Definity contrast agent was given IV to delineate the left ventricular  endocardial borders. The left ventricular internal cavity size was normal in size. There is no left ventricular hypertrophy. Left ventricular diastolic function could not be evaluated due to atrial fibrillation. Left ventricular diastolic function could  not be evaluated. Right Ventricle: The right ventricular size is not well visualized. Right vetricular wall thickness was not well visualized. Right ventricular systolic function was not well visualized. There is normal pulmonary artery systolic pressure. The tricuspid regurgitant velocity is 2.36 m/s, and with an assumed right atrial pressure of 8 mmHg, the estimated right ventricular systolic pressure is 06.3 mmHg. Left Atrium: Left atrial size was not well visualized. Right Atrium: Right atrial size was not well  visualized. Pericardium: A small pericardial effusion is present. Mitral Valve: The mitral valve is normal in structure. There is mild thickening of the mitral valve leaflet(s). There is moderate calcification of the mitral valve leaflet(s). Moderate mitral annular calcification. Trivial mitral valve regurgitation. No evidence of mitral valve stenosis. Tricuspid Valve: The tricuspid valve is normal in structure. Tricuspid valve regurgitation is mild . No evidence of tricuspid stenosis. Aortic Valve: The aortic valve is tricuspid. There is mild calcification of the aortic valve. There is mild thickening of the aortic valve. There is mild to moderate aortic valve annular calcification. Aortic valve regurgitation is not visualized. Mild aortic valve sclerosis is present, with no evidence of aortic valve stenosis. Pulmonic Valve: The pulmonic valve was grossly normal. Pulmonic valve regurgitation is not visualized. No evidence of pulmonic stenosis. Aorta: The aortic root, ascending aorta and aortic arch are all structurally normal, with no evidence of dilitation or obstruction. Venous: The inferior vena cava is normal in size with less than 50% respiratory variability, suggesting right atrial pressure of 8 mmHg. IAS/Shunts: The atrial septum is grossly normal.  LEFT VENTRICLE PLAX 2D LVIDd:         4.60 cm LVIDs:         3.20 cm LV PW:         0.80 cm LV IVS:        0.90 cm LVOT diam:     2.00 cm LV SV:         46 LV SV Index:   24 LVOT Area:     3.14 cm  LV Volumes (MOD) LV vol d, MOD A2C: 58.0 ml LV vol d, MOD A4C: 52.6 ml LV vol s, MOD A2C: 21.8 ml LV vol s, MOD A4C: 22.0 ml LV SV MOD A2C:     36.2 ml LV SV MOD A4C:     52.6 ml LV SV MOD BP:      34.0 ml RIGHT VENTRICLE RV S prime:     7.18 cm/s LEFT ATRIUM  Index       RIGHT ATRIUM           Index LA diam:        3.90 cm 2.01 cm/m  RA Area:     13.60 cm LA Vol (A2C):   34.6 ml 17.85 ml/m RA Volume:   29.00 ml  14.96 ml/m LA Vol (A4C):   37.2 ml 19.19  ml/m LA Biplane Vol: 36.9 ml 19.04 ml/m  AORTIC VALVE LVOT Vmax:   81.60 cm/s LVOT Vmean:  60.000 cm/s LVOT VTI:    0.148 m  AORTA Ao Root diam: 3.20 cm Ao Asc diam:  3.40 cm MITRAL VALVE               TRICUSPID VALVE MV Area (PHT): 4.31 cm    TR Peak grad:   22.3 mmHg MV Decel Time: 176 msec    TR Vmax:        236.00 cm/s MV E velocity: 96.53 cm/s                            SHUNTS                            Systemic VTI:  0.15 m                            Systemic Diam: 2.00 cm Buford Dresser MD Electronically signed by Buford Dresser MD Signature Date/Time: 07/31/2020/2:52:10 PM    Final     Microbiology: Recent Results (from the past 240 hour(s))  Resp Panel by RT-PCR (Flu A&B, Covid) Nasopharyngeal Swab     Status: None   Collection Time: 08/12/20  2:58 PM   Specimen: Nasopharyngeal Swab; Nasopharyngeal(NP) swabs in vial transport medium  Result Value Ref Range Status   SARS Coronavirus 2 by RT PCR NEGATIVE NEGATIVE Final    Comment: (NOTE) SARS-CoV-2 target nucleic acids are NOT DETECTED.  The SARS-CoV-2 RNA is generally detectable in upper respiratory specimens during the acute phase of infection. The lowest concentration of SARS-CoV-2 viral copies this assay can detect is 138 copies/mL. A negative result does not preclude SARS-Cov-2 infection and should not be used as the sole basis for treatment or other patient management decisions. A negative result may occur with  improper specimen collection/handling, submission of specimen other than nasopharyngeal swab, presence of viral mutation(s) within the areas targeted by this assay, and inadequate number of viral copies(<138 copies/mL). A negative result must be combined with clinical observations, patient history, and epidemiological information. The expected result is Negative.  Fact Sheet for Patients:  EntrepreneurPulse.com.au  Fact Sheet for Healthcare Providers:   IncredibleEmployment.be  This test is no t yet approved or cleared by the [place] FDA and  has been authorized for detection and/or diagnosis of SARS-CoV-2 by FDA under an Emergency Use Authorization (EUA). This EUA will remain  in effect (meaning this test can be used) for the duration of the COVID-19 declaration under Section 564(b)(1) of the Act, 21 U.S.C.section 360bbb-3(b)(1), unless the authorization is terminated  or revoked sooner.       Influenza A by PCR NEGATIVE NEGATIVE Final   Influenza B by PCR NEGATIVE NEGATIVE Final    Comment: (NOTE) The Xpert Xpress SARS-CoV-2/FLU/RSV plus assay is intended as an aid in the diagnosis of influenza from Nasopharyngeal swab specimens and should not be  used as a sole basis for treatment. Nasal washings and aspirates are unacceptable for Xpert Xpress SARS-CoV-2/FLU/RSV testing.  Fact Sheet for Patients: EntrepreneurPulse.com.au  Fact Sheet for Healthcare Providers: IncredibleEmployment.be  This test is not yet approved or cleared by the [place] FDA and has been authorized for detection and/or diagnosis of SARS-CoV-2 by FDA under an Emergency Use Authorization (EUA). This EUA will remain in effect (meaning this test can be used) for the duration of the COVID-19 declaration under Section 564(b)(1) of the Act, 21 U.S.C. section 360bbb-3(b)(1), unless the authorization is terminated or revoked.  Performed at [facility], [place] [address], [place], [place] [address]      Labs: Basic Metabolic Panel: Recent Labs  Lab 08/12/20 1350 08/13/20 0832 08/14/20 0554  NA 144 142 141  K 5.6* 4.0 3.8  CL 99 100 104  CO2 34* 31 26  GLUCOSE 118* 113* 125*  BUN 23 20 17   CREATININE 1.60* 1.11* 1.19*  CALCIUM 9.6 8.7* 8.4*  MG  --  2.0 1.8   Liver Function Tests: Recent Labs  Lab 08/12/20 1350 08/14/20 0554  AST 23 23  ALT 20 17  ALKPHOS 75 68   BILITOT 0.6 0.5  PROT 6.9 6.3*  ALBUMIN 3.5 3.1*   No results for input(s): LIPASE, AMYLASE in the last 168 hours. No results for input(s): AMMONIA in the last 168 hours. CBC: Recent Labs  Lab 08/12/20 1350 08/13/20 0832 08/14/20 0554  WBC 10.6* 11.6* 11.2*  HGB 12.7 12.1 11.7*  HCT 42.1 40.2 38.4  MCV 94.0 93.7 93.2  PLT 252 253 235   Cardiac Enzymes: No results for input(s): CKTOTAL, CKMB, CKMBINDEX, TROPONINI in the last 168 hours. BNP: BNP (last 3 results) Recent Labs    07/31/20 0359 08/01/20 0149 08/02/20 0229  BNP 307.6* 221.7* 2,760.5*    ProBNP (last 3 results) No results for input(s): PROBNP in the last 8760 hours.  CBG: Recent Labs  Lab 08/13/20 1823 08/13/20 2221 08/14/20 0509 08/14/20 0738 08/14/20 1150  GLUCAP 78 112* 102* 124* 152*       Signed:  Florencia Reasons MD, PhD, FACP  [facility] 08/14/2020, 12:44 PM

## 2020-08-14 NOTE — NC FL2 (Signed)
[place] LEVEL OF CARE SCREENING TOOL     IDENTIFICATION  Patient Name: Dawn Morrow Birthdate: 12-31-1935 Sex: female Admission Date (Current Location): 08/12/2020  [facility] and [place] Number:  Herbalist and Address:  [facility],  [place], [place]      Provider Number: [id number]  Attending Physician Name and Address:  Florencia Reasons, MD  Relative Name and Phone Number:  Leota Jacobsen, Niece [phone], Hillard Danker [phone]    Current Level of Care: Hospital Recommended Level of Care: [place] Prior Approval Number:    Date Approved/Denied: 08/14/20 PASRR Number: [id number]  Discharge Plan: SNF    Current Diagnoses: Patient Active Problem List   Diagnosis Date Noted  . UTI (urinary tract infection) 07/31/2020  . CAP (community acquired pneumonia) 07/31/2020  . Pressure injury of skin 07/31/2020  . Malnutrition of moderate degree 07/19/2020  . Adjustment disorder with depressed mood 07/13/2020  . Dehydration 07/12/2020  . Fall 07/11/2020  . Acute metabolic encephalopathy 41/63/8453  . AMS (altered mental status) 03/26/2020  . History of pulmonary embolism 03/26/2020  . History of DVT (deep vein thrombosis) 03/26/2020  . Atrial fibrillation, chronic ([place]) 03/26/2020  . Sepsis ([place]) 12/14/2019  . Acute encephalopathy 12/14/2019  . COVID-19 virus infection 10/02/2019  . Acute lower UTI 10/02/2019  . AKI (acute kidney injury) ([place]) 10/01/2019  . Syncope 09/18/2017  . Near syncope 09/17/2017  . Diabetes mellitus type 2 in obese ([place]) 09/17/2017  . Atrial fibrillation with RVR ([place]) 09/17/2017  . Hypomagnesemia 09/17/2017  . CKD (chronic kidney disease), stage III ([place]) 09/17/2017  . Forehead contusion   . MGUS (monoclonal gammopathy of unknown significance) 08/13/2016  . Renal failure 07/09/2015  . Follow-up ---------PCP NOTES 05/17/2015  . Essential tremor 12/28/2014   . Ulnar neuropathy of left upper extremity 12/28/2014  . Intractable pain 06/21/2014  . Back pain 06/21/2014  . Lumbar radiculopathy 06/21/2014  . Encounter for therapeutic drug monitoring 03/14/2014  . Numbness 10/18/2013  . TIA (transient ischemic attack) 09/19/2013  . Weakness 09/18/2013  . Annual physical exam 05/30/2012  . Tremor 01/21/2012  . Failure to thrive and poor med compliance  01/21/2012  . Pulmonary embolism ([place]) 11/18/2010  . DVT (deep venous thrombosis) ([place]) 11/18/2010  . Long term current use of anticoagulant 11/18/2010  . ABNORMAL ELECTROCARDIOGRAM 11/10/2010  . OSTEOARTHRITIS 08/06/2010  . DIZZINESS 04/15/2010  . Diabetes ([place]) 06/04/2009  . ACNE ROSACEA 11/28/2008  . BACK PAIN 05/16/2008  . HIP PAIN, RIGHT, CHRONIC 09/15/2007  . Hypothyroidism 09/13/2007  . Dyslipidemia 09/13/2007  . Osteoporosis 07/22/2007  . DEPRESSION 09/11/2006  . HTN (hypertension) 09/11/2006  . ASTHMA 09/11/2006  . COPD (chronic obstructive pulmonary disease) ([place]) 09/11/2006  . GERD 09/11/2006  . Recurrent UTI 09/11/2006  . GREENFIELD FILTER INSERTION, HX OF 09/11/2006    Orientation RESPIRATION BLADDER Height & Weight     Self, Time  O2 Incontinent, External catheter Weight: 84 kg Height:  5' 5.5" (166.4 cm)  BEHAVIORAL SYMPTOMS/MOOD NEUROLOGICAL BOWEL NUTRITION STATUS      Incontinent Diet (Carb modified)  AMBULATORY STATUS COMMUNICATION OF NEEDS Skin   Extensive Assist Verbally Other (Comment), PU Stage and Appropriate Care (Buttocks right, left stage1, Abrasion, Ecchymosis)                       Personal Care Assistance Level of Assistance  Bathing, Feeding, Dressing Bathing Assistance: Maximum assistance Feeding assistance: Independent Dressing Assistance:  Maximum assistance     Functional Limitations Info  Sight, Hearing, Speech Sight Info: Adequate Hearing Info: Adequate Speech Info: Impaired    SPECIAL CARE FACTORS FREQUENCY  PT (By licensed PT),  OT (By licensed OT)     PT Frequency: 5x week OT Frequency: 5x week            Contractures Contractures Info: Not present    Additional Factors Info  Code Status Code Status Info: DNR Allergies Info: Penicillins, Codeine, Sulfonylureas, Sulfonamide Derivatives           Current Medications (08/14/2020):  This is the current hospital active medication list Current Facility-Administered Medications  Medication Dose Route Frequency Provider Last Rate Last Admin  . 0.9 %  sodium chloride infusion   Intravenous Continuous Pokhrel, Laxman, MD 100 mL/hr at 08/14/20 0600 Rate Verify at 08/14/20 0600  . acetaminophen (TYLENOL) tablet 650 mg  650 mg Oral Q6H PRN Marylyn Ishihara, Tyrone A, DO       Or  . acetaminophen (TYLENOL) suppository 650 mg  650 mg Rectal Q6H PRN Marylyn Ishihara, Tyrone A, DO      . albuterol (VENTOLIN HFA) 108 (90 Base) MCG/ACT inhaler 2 puff  2 puff Inhalation Q6H PRN Pokhrel, Laxman, MD      . atorvastatin (LIPITOR) tablet 10 mg  10 mg Oral QHS Pokhrel, Laxman, MD   10 mg at 08/13/20 2237  . buPROPion (WELLBUTRIN XL) 24 hr tablet 300 mg  300 mg Oral Daily Pokhrel, Laxman, MD   300 mg at 08/14/20 0837  . chlorhexidine (PERIDEX) 0.12 % solution 15 mL  15 mL Mouth Rinse BID Kyle, Tyrone A, DO   15 mL at 08/14/20 0837  . cholecalciferol (VITAMIN D3) tablet 2,000 Units  2,000 Units Oral Daily Pokhrel, Laxman, MD   2,000 Units at 08/14/20 [phone]  . docusate sodium (COLACE) capsule 100 mg  100 mg Oral BID Pokhrel, Laxman, MD   100 mg at 08/14/20 0838  . ezetimibe (ZETIA) tablet 10 mg  10 mg Oral Daily Pokhrel, Laxman, MD   10 mg at 08/14/20 7673  . gabapentin (NEURONTIN) capsule 300 mg  300 mg Oral BID Pokhrel, Laxman, MD   300 mg at 08/14/20 0509  . gabapentin (NEURONTIN) capsule 600 mg  600 mg Oral QHS Pokhrel, Laxman, MD   600 mg at 08/13/20 2236  . insulin aspart (novoLOG) injection 0-15 Units  0-15 Units Subcutaneous TID WC Kyle, Tyrone A, DO   2 Units at 08/14/20 0837  . insulin aspart  (novoLOG) injection 0-5 Units  0-5 Units Subcutaneous QHS Kyle, Tyrone A, DO   3 Units at 08/12/20 2149  . insulin glargine (LANTUS) injection 5 Units  5 Units Subcutaneous QHS Pokhrel, Laxman, MD   5 Units at 08/13/20 2236  . levothyroxine (SYNTHROID) tablet 88 mcg  88 mcg Oral Q0600 Cherylann Ratel A, DO   88 mcg at 08/14/20 0509  . meclizine (ANTIVERT) tablet 25 mg  25 mg Oral QHS PRN Pokhrel, Laxman, MD      . MEDLINE mouth rinse  15 mL Mouth Rinse q12n4p Kyle, Tyrone A, DO   15 mL at 08/13/20 1737  . nystatin (MYCOSTATIN/NYSTOP) topical powder 1 application  1 application Topical BID Pokhrel, Laxman, MD   1 application at 41/93/79 0841  . ondansetron (ZOFRAN) tablet 4 mg  4 mg Oral Q6H PRN Marylyn Ishihara, Tyrone A, DO       Or  . ondansetron (ZOFRAN) injection 4 mg  4 mg Intravenous Q6H PRN  Marylyn Ishihara, Tyrone A, DO      . polyethylene glycol (MIRALAX / GLYCOLAX) packet 17 g  17 g Oral Daily PRN Pokhrel, Laxman, MD      . polyvinyl alcohol (LIQUIFILM TEARS) 1.4 % ophthalmic solution 1 drop  1 drop Both Eyes QID PRN Pokhrel, Laxman, MD      . rivaroxaban (XARELTO) tablet 20 mg  20 mg Oral Q supper Pokhrel, Laxman, MD   20 mg at 08/13/20 1919  . sertraline (ZOLOFT) tablet 100 mg  100 mg Oral Daily Pokhrel, Laxman, MD   100 mg at 08/14/20 2179     Discharge Medications: Please see discharge summary for a list of discharge medications.  Relevant Imaging Results:  Relevant Lab Results:   Additional Information [id number]  Purcell Mouton, RN

## 2020-08-14 NOTE — Progress Notes (Signed)
2 attempts  Made to call report to [facility]. Left message for a return call.

## 2020-08-14 NOTE — TOC Progression Note (Signed)
Transition of Care [facility]) - Progression Note    Patient Details  Name: Dawn Morrow MRN: [id number] Date of Birth: 1935/12/19  Transition of Care [facility]) CM/SW Contact  Purcell Mouton, RN Phone Number: 08/14/2020, 12:11 PM  Clinical Narrative:    Pt from [facility] and plan to return. Spoke with nephew Dawn Morrow, who agreed with pt going back to SNF.    Expected Discharge Plan: [place] Barriers to Discharge: No Barriers Identified  Expected Discharge Plan and Services Expected Discharge Plan: [place]   Discharge Planning Services: CM Consult Post Acute Care Choice: Nursing Home Living arrangements for the past 2 months: [place] Expected Discharge Date: 08/14/20                                     Social Determinants of Health (SDOH) Interventions    Readmission Risk Interventions Readmission Risk Prevention Plan 10/03/2019  Transportation Screening Complete  PCP or Specialist Appt within 3-5 Days Complete  HRI or [place] Not Complete  HRI or Home Care Consult comments Patient at her baseline  Social Work Consult for [place] Planning/Counseling [place] Not Applicable  Medication Review Press photographer) Referral to Pharmacy  Some recent data might be hidden

## 2020-08-14 NOTE — TOC Progression Note (Signed)
Transition of Care [facility]) - Progression Note    Patient Details  Name: Dawn Morrow MRN: [id number] Date of Birth: 08/13/1936  Transition of Care [facility]) CM/SW Contact  Purcell Mouton, RN Phone Number: 08/14/2020, 1:57 PM  Clinical Narrative:    [facility] transportation called, RN is aware.   Expected Discharge Plan: [place] Barriers to Discharge: No Barriers Identified  Expected Discharge Plan and Services Expected Discharge Plan: [place]   Discharge Planning Services: CM Consult Post Acute Care Choice: Nursing Home Living arrangements for the past 2 months: [place] Expected Discharge Date: 08/14/20                                     Social Determinants of Health (SDOH) Interventions    Readmission Risk Interventions Readmission Risk Prevention Plan 10/03/2019  Transportation Screening Complete  PCP or Specialist Appt within 3-5 Days Complete  HRI or [place] Not Complete  HRI or Home Care Consult comments Patient at her baseline  Social Work Consult for [place] Planning/Counseling [place] Not Applicable  Medication Review Press photographer) Referral to Pharmacy  Some recent data might be hidden

## 2020-08-14 NOTE — TOC Progression Note (Signed)
Transition of Care [facility]) - Progression Note    Patient Details  Name: Dawn Morrow MRN: [id number] Date of Birth: 1935-12-04  Transition of Care [facility] CM/SW Contact  Purcell Mouton, RN Phone Number: 08/14/2020, 11:45 AM  Clinical Narrative:    Spoke with pt's nephew Joe concerning pt's discharge back to [facility]. Joe states that [facility] have already called his sister [name] and that either one of them can sign her back in to [facility].         Expected Discharge Plan and Services           Expected Discharge Date: 08/14/20                                     Social Determinants of Health (SDOH) Interventions    Readmission Risk Interventions Readmission Risk Prevention Plan 10/03/2019  Transportation Screening Complete  PCP or Specialist Appt within 3-5 Days Complete  HRI or [place] Not Complete  HRI or Home Care Consult comments Patient at her baseline  Social Work Consult for [place] Planning/Counseling [place] Not Applicable  Medication Review Press photographer) Referral to Pharmacy  Some recent data might be hidden

## 2020-08-14 NOTE — Evaluation (Signed)
Physical Therapy Evaluation Patient Details Name: Dawn Morrow MRN: [id number] DOB: Jun 28, 1936 Today's Date: 08/14/2020   History of Present Illness  84 yo female admitted from SNF with syncope. Hx of dementia, frequent falls, COPD-O2 dep at baseline, DVT, PE, Afib.  Clinical Impression  On eval, pt required Min assist to stand using STEDY x 2. Used STEDY to transfer pt to recliner. Remained on [place] O2. Pt tolerated activity well. She is pleasantly confused. Recommend return to SNF.     Follow Up Recommendations SNF    Equipment Recommendations  None recommended by PT    Recommendations for Other Services       Precautions / Restrictions Precautions Precautions: Fall Precaution Comments: Bladder/bowel incontinence with h/o recurrent UTIs Restrictions Weight Bearing Restrictions: No      Mobility  Bed Mobility Overal bed mobility: Needs Assistance Bed Mobility: Supine to Sit Rolling: Min assist         General bed mobility comments: Assist to scoot to EOB. Increased time. Cues for safety, technique    Transfers Overall transfer level: Needs assistance   Transfers: Sit to/from Stand Sit to Stand: Min assist         General transfer comment: Assist to power up, stabilize, control descent. Sit to stand x 2 (once from elevated bed, once from [facility])  Ambulation/Gait             General Gait Details: NT-nonambulatory??  Stairs            Wheelchair Mobility    Modified Rankin (Stroke Patients Only)       Balance Overall balance assessment: Needs assistance;History of Falls         Standing balance support: Bilateral upper extremity supported Standing balance-Leahy Scale: Poor                               Pertinent Vitals/Pain Pain Assessment: No/denies pain    Home Living Family/patient expects to be discharged to:: Skilled nursing facility                      Prior Function Level of Independence: Needs assistance    Gait / Transfers Assistance Needed: unreliable historian-likely requires assistance. per chart, possibly nonambulatory at baseline  ADL's / Homemaking Assistance Needed: Unreliable historian - likely requires asssitance with ADLs secondary to dementia/cognitive deficits.        Hand Dominance        Extremity/Trunk Assessment   Upper Extremity Assessment Upper Extremity Assessment: Generalized weakness    Lower Extremity Assessment Lower Extremity Assessment: Generalized weakness    Cervical / Trunk Assessment Cervical / Trunk Assessment: Normal  Communication   Communication: HOH  Cognition Arousal/Alertness: Awake/alert Behavior During Therapy: WFL for tasks assessed/performed Overall Cognitive Status: History of cognitive impairments - at baseline                                 General Comments: H/o dementia with apparent cognitive impairment likely exacerbated by [facility]. Pleasantly confused.      General Comments      Exercises     Assessment/Plan    PT Assessment Patient needs continued PT services (recommend return to SNF)  PT Problem List Decreased strength;Decreased mobility;Decreased activity tolerance;Decreased balance;Decreased knowledge of use of DME;Decreased cognition;Decreased safety awareness       PT Treatment Interventions DME instruction;Gait  training;Therapeutic activities;Therapeutic exercise;Patient/family education;Balance training;Functional mobility training    PT Goals (Current goals can be found in the Care Plan section)  Acute Rehab PT Goals Patient Stated Goal: none stated PT Goal Formulation: Patient unable to participate in goal setting Time For Goal Achievement: 08/28/20 Potential to Achieve Goals: Fair    Frequency Min 2X/week   Barriers to discharge        Co-evaluation               AM-PAC PT "6 Clicks" Mobility  Outcome Measure Help needed turning from your back to your side while in a flat bed  without using bedrails?: A Little Help needed moving from lying on your back to sitting on the side of a flat bed without using bedrails?: A Little Help needed moving to and from a bed to a chair (including a wheelchair)?: Total Help needed standing up from a chair using your arms (e.g., wheelchair or bedside chair)?: A Lot Help needed to walk in hospital room?: Total Help needed climbing 3-5 steps with a railing? : Total 6 Click Score: 11    End of Session Equipment Utilized During Treatment: Oxygen;Gait belt Activity Tolerance: Patient tolerated treatment well Patient left: in chair;with call bell/phone within reach;with chair alarm set   PT Visit Diagnosis: Muscle weakness (generalized) (M62.81);Other abnormalities of gait and mobility (R26.89)    Time: 3524-8185 PT Time Calculation (min) (ACUTE ONLY): 20 min   Charges:   PT Evaluation $PT Eval Moderate Complexity: [place], PT Acute Rehabilitation  Office: [phone] Pager: [phone]

## 2020-08-14 NOTE — Plan of Care (Signed)
  Problem: Clinical Measurements: Goal: Will remain free from infection Outcome: Progressing Goal: Cardiovascular complication will be avoided Outcome: Progressing   Problem: Education: Goal: Knowledge of General Education information will improve Description: Including pain rating scale, medication(s)/side effects and non-pharmacologic comfort measures Outcome: Adequate for Discharge   Problem: Health Behavior/Discharge Planning: Goal: Ability to manage health-related needs will improve Outcome: Adequate for Discharge   Problem: Clinical Measurements: Goal: Respiratory complications will improve Outcome: Adequate for Discharge   Problem: Activity: Goal: Risk for activity intolerance will decrease Outcome: Adequate for Discharge

## 2021-01-25 ENCOUNTER — Emergency Department (HOSPITAL_COMMUNITY): Payer: Medicare Other

## 2021-01-25 ENCOUNTER — Other Ambulatory Visit: Payer: Self-pay

## 2021-01-25 ENCOUNTER — Emergency Department (HOSPITAL_COMMUNITY)
Admission: EM | Admit: 2021-01-25 | Discharge: 2021-01-26 | Disposition: A | Discharge status: Home / Self Care | Payer: Medicare Other | Attending: Emergency Medicine | Admitting: Emergency Medicine

## 2021-01-25 DIAGNOSIS — F0390 Unspecified dementia without behavioral disturbance: Secondary | ICD-10-CM | POA: Insufficient documentation

## 2021-01-25 DIAGNOSIS — Z7901 Long term (current) use of anticoagulants: Secondary | ICD-10-CM | POA: Diagnosis not present

## 2021-01-25 DIAGNOSIS — W19XXXA Unspecified fall, initial encounter: Secondary | ICD-10-CM | POA: Diagnosis not present

## 2021-01-25 DIAGNOSIS — N179 Acute kidney failure, unspecified: Secondary | ICD-10-CM

## 2021-01-25 DIAGNOSIS — S0001XA Abrasion of scalp, initial encounter: Secondary | ICD-10-CM | POA: Diagnosis not present

## 2021-01-25 DIAGNOSIS — M25522 Pain in left elbow: Secondary | ICD-10-CM | POA: Diagnosis not present

## 2021-01-25 DIAGNOSIS — S0990XA Unspecified injury of head, initial encounter: Secondary | ICD-10-CM | POA: Diagnosis present

## 2021-01-25 LAB — CBC WITH DIFFERENTIAL/PLATELET
Abs Immature Granulocytes: 0.07 10*3/uL (ref 0.00–0.07)
Basophils Absolute: 0 10*3/uL (ref 0.0–0.1)
Basophils Relative: 0 %
Eosinophils Absolute: 0 10*3/uL (ref 0.0–0.5)
Eosinophils Relative: 0 %
HCT: 43.7 % (ref 36.0–46.0)
Hemoglobin: 13.8 g/dL (ref 12.0–15.0)
Immature Granulocytes: 1 %
Lymphocytes Relative: 8 %
Lymphs Abs: 0.7 10*3/uL (ref 0.7–4.0)
MCH: 30.1 pg (ref 26.0–34.0)
MCHC: 31.6 g/dL (ref 30.0–36.0)
MCV: 95.4 fL (ref 80.0–100.0)
Monocytes Absolute: 0.5 10*3/uL (ref 0.1–1.0)
Monocytes Relative: 5 %
Neutro Abs: 7.7 10*3/uL (ref 1.7–7.7)
Neutrophils Relative %: 86 %
Platelets: 155 10*3/uL (ref 150–400)
RBC: 4.58 MIL/uL (ref 3.87–5.11)
RDW: 15.6 % — ABNORMAL HIGH (ref 11.5–15.5)
WBC: 8.9 10*3/uL (ref 4.0–10.5)
nRBC: 0 % (ref 0.0–0.2)

## 2021-01-25 LAB — COMPREHENSIVE METABOLIC PANEL
ALT: 20 U/L (ref 0–44)
AST: 28 U/L (ref 15–41)
Albumin: 3.5 g/dL (ref 3.5–5.0)
Alkaline Phosphatase: 69 U/L (ref 38–126)
Anion gap: 9 (ref 5–15)
BUN: 24 mg/dL — ABNORMAL HIGH (ref 8–23)
CO2: 30 mmol/L (ref 22–32)
Calcium: 9.1 mg/dL (ref 8.9–10.3)
Chloride: 99 mmol/L (ref 98–111)
Creatinine, Ser: 1.9 mg/dL — ABNORMAL HIGH (ref 0.44–1.00)
GFR, Estimated: 26 mL/min — ABNORMAL LOW (ref >60)
Glucose, Bld: 154 mg/dL — ABNORMAL HIGH (ref 70–99)
Potassium: 4.8 mmol/L (ref 3.5–5.1)
Sodium: 138 mmol/L (ref 135–145)
Total Bilirubin: 0.9 mg/dL (ref 0.3–1.2)
Total Protein: 7.4 g/dL (ref 6.5–8.1)

## 2021-01-25 LAB — TROPONIN I (HIGH SENSITIVITY): Troponin I (High Sensitivity): 15 ng/L (ref <18)

## 2021-01-25 LAB — CK: Total CK: 59 U/L (ref 38–234)

## 2021-01-25 MED ORDER — SODIUM CHLORIDE 0.9 % IV BOLUS
1000.0000 mL | Freq: Once | INTRAVENOUS | Status: AC
  Administered 2021-01-25: 1000 mL via INTRAVENOUS

## 2021-01-25 NOTE — ED Notes (Signed)
Patient transported to CT 

## 2021-01-25 NOTE — ED Notes (Signed)
Portable chest and pelvic xray complete

## 2021-01-25 NOTE — Discharge Instructions (Signed)
Stay hydrated.  Repeat kidney function in a week.  Her CT today did not show any fracture or bleeding  Fall precautions  See your doctor for follow-up  Return to ER if you have another fall, vomiting, headaches

## 2021-01-25 NOTE — ED Provider Notes (Signed)
[facility] Provider Note   CSN: [id number] Arrival date & time: 01/25/21  1947     History Chief Complaint  Patient presents with  . Fall    JANEKA LIBMAN is a 85 y.o. female here with a fall.  Patient is demented and from a nursing home.  Patient apparently had a unwitnessed fall.  Patient is found on the floor today.  Patient was complaining of some elbow pain.  However there is no obvious elbow laceration or injury or trauma.  Patient is demented and unable to give much history  The history is provided by the patient and the EMS personnel.  Level V caveat- dementia     No past medical history on file.  There are no problems to display for this patient.   OB History   No obstetric history on file.     No family history on file.     Home Medications Prior to Admission medications   Medication Sig Start Date End Date Taking? Authorizing Provider  atorvastatin (LIPITOR) 10 MG tablet Take 10 mg by mouth at bedtime. 01/12/21  Yes [provider]  buPROPion (WELLBUTRIN XL) 300 MG 24 hr tablet Take 300 mg by mouth every morning. 12/26/20  Yes [provider]  carvedilol (COREG) 12.5 MG tablet Take 12.5 mg by mouth 2 (two) times daily with a meal. 01/21/21  Yes [provider]  ezetimibe (ZETIA) 10 MG tablet Take 10 mg by mouth every morning. 12/26/20  Yes [provider]  gabapentin (NEURONTIN) 300 MG capsule Take 300-600 mg by mouth See admin instructions. Take one capsule (300 mg) by mouth every morning and at noon, take two capsules (600 mg) at bedtime 12/24/20  Yes [provider]  Insulin Glargine (BASAGLAR KWIKPEN) 100 UNIT/ML Inject 5 Units into the skin at bedtime.   Yes [provider]  levothyroxine (SYNTHROID) 88 MCG tablet Take 88 mcg by mouth daily before breakfast. 12/31/20  Yes [provider]  Multiple Vitamin (MULTIVITAMIN WITH MINERALS) TABS tablet Take 1 tablet by  mouth every morning.   Yes [provider]  rivaroxaban (XARELTO) 20 MG TABS tablet Take 20 mg by mouth daily with supper.   Yes [provider]  sertraline (ZOLOFT) 100 MG tablet Take 100 mg by mouth every morning. 01/24/21  Yes [provider]    Allergies    Penicillins, Codeine, Sulfa antibiotics, and Sulfonylureas  Review of Systems   Review of Systems  Neurological: Positive for headaches.  All other systems reviewed and are negative.   Physical Exam Updated Vital Signs BP 130/76   Pulse 86   Temp 99.3 F (37.4 C)   Resp 19   Ht 5\' 5"  (1.651 m)   Wt 90.7 kg   SpO2 100%   BMI 33.28 kg/m   Physical Exam Vitals and nursing note reviewed.  Constitutional:      Appearance: Normal appearance.  HENT:     Head: Normocephalic.     Comments: Abrasion R scalp     Mouth/Throat:     Mouth: Mucous membranes are moist.  Eyes:     Extraocular Movements: Extraocular movements intact.     Pupils: Pupils are equal, round, and reactive to light.  Cardiovascular:     Rate and Rhythm: Normal rate and regular rhythm.     Pulses: Normal pulses.     Heart sounds: Normal heart sounds.  Pulmonary:     Effort: Pulmonary effort is normal.  Breath sounds: Normal breath sounds.  Abdominal:     General: Abdomen is flat.     Palpations: Abdomen is soft.  Musculoskeletal:        General: Normal range of motion.     Cervical back: Normal range of motion and neck supple.     Comments: No obvious spinal tenderness.  Patient has no obvious extremity trauma.  Of note, she has a left vaccine site in the right deltoid area slightly erythematous.  However there is no purulent discharge or signs of cellulitis  Skin:    General: Skin is warm.     Capillary Refill: Capillary refill takes less than 2 seconds.  Neurological:     General: No focal deficit present.     Mental Status: She is alert and oriented to person, place, and time.  Psychiatric:        Mood and  Affect: Mood normal.        Behavior: Behavior normal.     ED Results / Procedures / Treatments   Labs (all labs ordered are listed, but only abnormal results are displayed) Labs Reviewed  CBC WITH DIFFERENTIAL/PLATELET - Abnormal; Notable for the following components:      Result Value   RDW 15.6 (*)    All other components within normal limits  COMPREHENSIVE METABOLIC PANEL - Abnormal; Notable for the following components:   Glucose, Bld 154 (*)    BUN 24 (*)    Creatinine, Ser 1.90 (*)    GFR, Estimated 26 (*)    All other components within normal limits  CK  TROPONIN I (HIGH SENSITIVITY)    EKG None  Radiology CT Head Wo Contrast  Result Date: 01/25/2021 CLINICAL DATA:  Facial trauma Unwitnessed fall. EXAM: CT HEAD WITHOUT CONTRAST TECHNIQUE: Contiguous axial images were obtained from the base of the skull through the vertex without intravenous contrast. COMPARISON:  Head CT 08/12/2020 FINDINGS: Brain: Normal brain volume for age. Stable degree of chronic small vessel ischemia. No intracranial hemorrhage, mass effect, or midline shift. No hydrocephalus. The basilar cisterns are patent. No evidence of territorial infarct or acute ischemia. No extra-axial or intracranial fluid collection. Vascular: Atherosclerosis of skullbase vasculature without hyperdense vessel or abnormal calcification. Skull: No fracture or focal lesion. Sinuses/Orbits: No acute findings. Bilateral cataract resection. Mastoid air cells are clear. Other: None. IMPRESSION: 1. No acute intracranial abnormality. No skull fracture. 2. Stable degree of chronic small vessel ischemia from December 2021. Electronically Signed   By: Keith Rake M.D.   On: 01/25/2021 20:37   CT Cervical Spine Wo Contrast  Result Date: 01/25/2021 CLINICAL DATA:  Neck trauma (Age >= 65y) EXAM: CT CERVICAL SPINE WITHOUT CONTRAST TECHNIQUE: Multidetector CT imaging of the cervical spine was performed without intravenous contrast.  Multiplanar CT image reconstructions were also generated. COMPARISON:  Cervical spine CT 08/12/2020 FINDINGS: Alignment: No traumatic subluxation. Trace anterolisthesis of C7 on T1 is likely degenerative. Skull base and vertebrae: No acute fracture. Vertebral body heights are maintained. The dens and skull base are intact. Soft tissues and spinal canal: No prevertebral fluid or swelling. No visible canal hematoma. Disc levels: Multilevel degenerative disc disease, unchanged from prior exam. Bulky anterior osteophytes multiple levels most prominently at C4-C5. Scattered facet hypertrophy. Upper chest: No acute or unexpected findings Other: Advanced carotid calcifications. IMPRESSION: 1. No acute fracture or subluxation of the cervical spine. 2. Multilevel degenerative disc disease and facet hypertrophy, not significantly changed from December of 2021. 3. Advanced carotid calcifications. Electronically Signed  By: Keith Rake M.D.   On: 01/25/2021 20:35   DG Pelvis Portable  Result Date: 01/25/2021 CLINICAL DATA:  Fall EXAM: PORTABLE PELVIS 1-2 VIEWS COMPARISON:  07/11/2020 pelvic radiograph FINDINGS: No pelvic fracture or diastasis. No evidence of hip dislocation on this frontal view. Marked degenerative changes in the visualized lower lumbar spine. No suspicious focal osseous lesions. IMPRESSION: No pelvic fracture. Electronically Signed   By: Ilona Sorrel M.D.   On: 01/25/2021 20:17   DG Chest Port 1 View  Result Date: 01/25/2021 CLINICAL DATA:  Fall EXAM: PORTABLE CHEST 1 VIEW COMPARISON:  10/14/2019 chest radiograph. FINDINGS: Stable cardiomediastinal silhouette with mild cardiomegaly. No pneumothorax. No pleural effusion. Lungs appear clear, with no acute consolidative airspace disease and no pulmonary edema. No displaced fractures. IMPRESSION: Stable mild cardiomegaly without pulmonary edema. No active pulmonary disease. Electronically Signed   By: Ilona Sorrel M.D.   On: 01/25/2021 20:16     Procedures Procedures   Medications Ordered in ED Medications - No data to display  ED Course  I have reviewed the triage vital signs and the nursing notes.  Pertinent labs & imaging results that were available during my care of the patient were reviewed by me and considered in my medical decision making (see chart for details).    MDM Rules/Calculators/A&P                         ADAEZE BETTER is a 85 y.o. female here with fall.  Patient is from a nursing home. Patient had unwitnessed fall.  Plan to get labs and CT head and neck.  11:21 PM Creatinine is 1.9 and baseline is somewhere around 1.3.  Given IV fluids.  CT head and neck unremarkable.  Stable for discharge back to facility   Final Clinical Impression(s) / ED Diagnoses Final diagnoses:  None    Rx / DC Orders ED Discharge Orders    None       Drenda Freeze, MD 01/25/21 2325

## 2021-01-25 NOTE — ED Triage Notes (Signed)
Per [facility] pt had unwitnessed fall, found facedown; v/s stable; EDP at bedside

## 2021-01-26 NOTE — ED Notes (Signed)
[facility] called  

## 2021-04-21 ENCOUNTER — Inpatient Hospital Stay (HOSPITAL_COMMUNITY): Payer: Medicare Other

## 2021-04-21 ENCOUNTER — Encounter (HOSPITAL_COMMUNITY): Payer: Self-pay

## 2021-04-21 ENCOUNTER — Other Ambulatory Visit: Payer: Self-pay

## 2021-04-21 ENCOUNTER — Emergency Department (HOSPITAL_COMMUNITY): Payer: Medicare Other

## 2021-04-21 ENCOUNTER — Observation Stay (HOSPITAL_COMMUNITY)
Admission: EM | Admit: 2021-04-21 | Discharge: 2021-04-22 | Disposition: A | Discharge status: Home / Self Care | Payer: Medicare Other | Attending: Internal Medicine | Admitting: Internal Medicine

## 2021-04-21 DIAGNOSIS — Z20822 Contact with and (suspected) exposure to covid-19: Secondary | ICD-10-CM | POA: Diagnosis not present

## 2021-04-21 DIAGNOSIS — Z79899 Other long term (current) drug therapy: Secondary | ICD-10-CM | POA: Diagnosis not present

## 2021-04-21 DIAGNOSIS — Y92129 Unspecified place in nursing home as the place of occurrence of the external cause: Secondary | ICD-10-CM | POA: Diagnosis not present

## 2021-04-21 DIAGNOSIS — S12000A Unspecified displaced fracture of first cervical vertebra, initial encounter for closed fracture: Secondary | ICD-10-CM | POA: Diagnosis present

## 2021-04-21 DIAGNOSIS — Z794 Long term (current) use of insulin: Secondary | ICD-10-CM | POA: Insufficient documentation

## 2021-04-21 DIAGNOSIS — J449 Chronic obstructive pulmonary disease, unspecified: Secondary | ICD-10-CM | POA: Diagnosis not present

## 2021-04-21 DIAGNOSIS — E039 Hypothyroidism, unspecified: Secondary | ICD-10-CM | POA: Diagnosis not present

## 2021-04-21 DIAGNOSIS — E1122 Type 2 diabetes mellitus with diabetic chronic kidney disease: Secondary | ICD-10-CM | POA: Insufficient documentation

## 2021-04-21 DIAGNOSIS — S065X9A Traumatic subdural hemorrhage with loss of consciousness of unspecified duration, initial encounter: Secondary | ICD-10-CM | POA: Diagnosis not present

## 2021-04-21 DIAGNOSIS — Z9981 Dependence on supplemental oxygen: Secondary | ICD-10-CM | POA: Diagnosis not present

## 2021-04-21 DIAGNOSIS — S065X0A Traumatic subdural hemorrhage without loss of consciousness, initial encounter: Secondary | ICD-10-CM | POA: Insufficient documentation

## 2021-04-21 DIAGNOSIS — N183 Chronic kidney disease, stage 3 unspecified: Secondary | ICD-10-CM | POA: Insufficient documentation

## 2021-04-21 DIAGNOSIS — I482 Chronic atrial fibrillation, unspecified: Secondary | ICD-10-CM | POA: Diagnosis not present

## 2021-04-21 DIAGNOSIS — J45909 Unspecified asthma, uncomplicated: Secondary | ICD-10-CM | POA: Insufficient documentation

## 2021-04-21 DIAGNOSIS — Z7901 Long term (current) use of anticoagulants: Secondary | ICD-10-CM | POA: Insufficient documentation

## 2021-04-21 DIAGNOSIS — F0390 Unspecified dementia without behavioral disturbance: Secondary | ICD-10-CM | POA: Insufficient documentation

## 2021-04-21 DIAGNOSIS — S0990XA Unspecified injury of head, initial encounter: Secondary | ICD-10-CM | POA: Diagnosis present

## 2021-04-21 DIAGNOSIS — I129 Hypertensive chronic kidney disease with stage 1 through stage 4 chronic kidney disease, or unspecified chronic kidney disease: Secondary | ICD-10-CM | POA: Diagnosis not present

## 2021-04-21 DIAGNOSIS — S065XAA Traumatic subdural hemorrhage with loss of consciousness status unknown, initial encounter: Secondary | ICD-10-CM

## 2021-04-21 DIAGNOSIS — W19XXXA Unspecified fall, initial encounter: Secondary | ICD-10-CM | POA: Insufficient documentation

## 2021-04-21 DIAGNOSIS — Z8673 Personal history of transient ischemic attack (TIA), and cerebral infarction without residual deficits: Secondary | ICD-10-CM | POA: Diagnosis not present

## 2021-04-21 DIAGNOSIS — Z87891 Personal history of nicotine dependence: Secondary | ICD-10-CM | POA: Diagnosis not present

## 2021-04-21 DIAGNOSIS — J189 Pneumonia, unspecified organism: Secondary | ICD-10-CM

## 2021-04-21 LAB — URINALYSIS, COMPLETE (UACMP) WITH MICROSCOPIC
Bilirubin Urine: NEGATIVE
Glucose, UA: NEGATIVE mg/dL
Ketones, ur: 5 mg/dL — AB
Leukocytes,Ua: NEGATIVE
Nitrite: NEGATIVE
Protein, ur: 100 mg/dL — AB
Specific Gravity, Urine: 1.018 (ref 1.005–1.030)
pH: 5 (ref 5.0–8.0)

## 2021-04-21 LAB — CBC WITH DIFFERENTIAL/PLATELET
Abs Immature Granulocytes: 0.05 10*3/uL (ref 0.00–0.07)
Basophils Absolute: 0 10*3/uL (ref 0.0–0.1)
Basophils Relative: 0 %
Eosinophils Absolute: 0.1 10*3/uL (ref 0.0–0.5)
Eosinophils Relative: 0 %
HCT: 42 % (ref 36.0–46.0)
Hemoglobin: 13.1 g/dL (ref 12.0–15.0)
Immature Granulocytes: 0 %
Lymphocytes Relative: 14 %
Lymphs Abs: 1.7 10*3/uL (ref 0.7–4.0)
MCH: 29.8 pg (ref 26.0–34.0)
MCHC: 31.2 g/dL (ref 30.0–36.0)
MCV: 95.5 fL (ref 80.0–100.0)
Monocytes Absolute: 0.8 10*3/uL (ref 0.1–1.0)
Monocytes Relative: 7 %
Neutro Abs: 9.5 10*3/uL — ABNORMAL HIGH (ref 1.7–7.7)
Neutrophils Relative %: 79 %
Platelets: 192 10*3/uL (ref 150–400)
RBC: 4.4 MIL/uL (ref 3.87–5.11)
RDW: 15 % (ref 11.5–15.5)
WBC: 12.2 10*3/uL — ABNORMAL HIGH (ref 4.0–10.5)
nRBC: 0 % (ref 0.0–0.2)

## 2021-04-21 LAB — SARS CORONAVIRUS 2 (TAT 6-24 HRS): SARS Coronavirus 2: NEGATIVE

## 2021-04-21 LAB — COMPREHENSIVE METABOLIC PANEL
ALT: 20 U/L (ref 0–44)
AST: 23 U/L (ref 15–41)
Albumin: 3.7 g/dL (ref 3.5–5.0)
Alkaline Phosphatase: 79 U/L (ref 38–126)
Anion gap: 11 (ref 5–15)
BUN: 28 mg/dL — ABNORMAL HIGH (ref 8–23)
CO2: 32 mmol/L (ref 22–32)
Calcium: 9.4 mg/dL (ref 8.9–10.3)
Chloride: 97 mmol/L — ABNORMAL LOW (ref 98–111)
Creatinine, Ser: 1.12 mg/dL — ABNORMAL HIGH (ref 0.44–1.00)
GFR, Estimated: 49 mL/min — ABNORMAL LOW (ref >60)
Glucose, Bld: 161 mg/dL — ABNORMAL HIGH (ref 70–99)
Potassium: 4 mmol/L (ref 3.5–5.1)
Sodium: 140 mmol/L (ref 135–145)
Total Bilirubin: 1 mg/dL (ref 0.3–1.2)
Total Protein: 7.4 g/dL (ref 6.5–8.1)

## 2021-04-21 LAB — APTT: aPTT: 35 seconds (ref 24–36)

## 2021-04-21 LAB — GLUCOSE, CAPILLARY: Glucose-Capillary: 127 mg/dL — ABNORMAL HIGH (ref 70–99)

## 2021-04-21 LAB — HEMOGLOBIN A1C
Hgb A1c MFr Bld: 6.4 % — ABNORMAL HIGH (ref 4.8–5.6)
Mean Plasma Glucose: 136.98 mg/dL

## 2021-04-21 LAB — PROTIME-INR
INR: 1.3 — ABNORMAL HIGH (ref 0.8–1.2)
Prothrombin Time: 16 seconds — ABNORMAL HIGH (ref 11.4–15.2)

## 2021-04-21 LAB — CBG MONITORING, ED
Glucose-Capillary: 140 mg/dL — ABNORMAL HIGH (ref 70–99)
Glucose-Capillary: 115 mg/dL — ABNORMAL HIGH (ref 70–99)

## 2021-04-21 LAB — AMMONIA: Ammonia: 25 umol/L (ref 9–35)

## 2021-04-21 MED ORDER — VITAMIN D 25 MCG (1000 UNIT) PO TABS
2000.0000 [IU] | ORAL_TABLET | Freq: Every day | ORAL | Status: DC
  Administered 2021-04-22: 2000 [IU] via ORAL
  Filled 2021-04-21: qty 2

## 2021-04-21 MED ORDER — BENZONATATE 100 MG PO CAPS
100.0000 mg | ORAL_CAPSULE | Freq: Two times a day (BID) | ORAL | Status: DC
  Administered 2021-04-21 – 2021-04-22 (×2): 100 mg via ORAL
  Filled 2021-04-21 (×2): qty 1

## 2021-04-21 MED ORDER — MECLIZINE HCL 25 MG PO TABS: 25.0000 mg | ORAL_TABLET | Freq: Every evening | ORAL | Status: DC | PRN

## 2021-04-21 MED ORDER — KETOCONAZOLE 2 % EX SHAM
1.0000 "application " | MEDICATED_SHAMPOO | CUTANEOUS | Status: DC
  Filled 2021-04-21: qty 120

## 2021-04-21 MED ORDER — LEVOTHYROXINE SODIUM 88 MCG PO TABS
88.0000 ug | ORAL_TABLET | Freq: Every day | ORAL | Status: DC
  Administered 2021-04-22: 88 ug via ORAL
  Filled 2021-04-21: qty 1

## 2021-04-21 MED ORDER — ALBUTEROL SULFATE (2.5 MG/3ML) 0.083% IN NEBU: 2.5000 mg | INHALATION_SOLUTION | Freq: Four times a day (QID) | RESPIRATORY_TRACT | Status: DC | PRN

## 2021-04-21 MED ORDER — DOCUSATE SODIUM 100 MG PO CAPS
100.0000 mg | ORAL_CAPSULE | Freq: Two times a day (BID) | ORAL | Status: DC
  Administered 2021-04-21 – 2021-04-22 (×2): 100 mg via ORAL
  Filled 2021-04-21 (×2): qty 1

## 2021-04-21 MED ORDER — ONDANSETRON HCL 4 MG PO TABS: 4.0000 mg | ORAL_TABLET | Freq: Three times a day (TID) | ORAL | Status: DC | PRN

## 2021-04-21 MED ORDER — ACETAMINOPHEN 325 MG PO TABS
650.0000 mg | ORAL_TABLET | Freq: Four times a day (QID) | ORAL | Status: DC | PRN
  Administered 2021-04-21: 650 mg via ORAL
  Filled 2021-04-21: qty 2

## 2021-04-21 MED ORDER — HYDRALAZINE HCL 20 MG/ML IJ SOLN: 5.0000 mg | Freq: Four times a day (QID) | INTRAMUSCULAR | Status: DC | PRN

## 2021-04-21 MED ORDER — ACETAMINOPHEN 500 MG PO TABS: 500.0000 mg | ORAL_TABLET | ORAL | Status: DC | PRN

## 2021-04-21 MED ORDER — DOXYCYCLINE HYCLATE 100 MG PO TABS
100.0000 mg | ORAL_TABLET | Freq: Two times a day (BID) | ORAL | Status: DC
  Administered 2021-04-21 – 2021-04-22 (×2): 100 mg via ORAL
  Filled 2021-04-21 (×3): qty 1

## 2021-04-21 MED ORDER — PROPYLENE GLYCOL 0.6 % OP SOLN: 1.0000 [drp] | Freq: Four times a day (QID) | OPHTHALMIC | Status: DC | PRN

## 2021-04-21 MED ORDER — POLYVINYL ALCOHOL 1.4 % OP SOLN: 1.0000 [drp] | OPHTHALMIC | Status: DC | PRN

## 2021-04-21 MED ORDER — ADULT MULTIVITAMIN W/MINERALS CH
1.0000 | ORAL_TABLET | Freq: Every day | ORAL | Status: DC
  Administered 2021-04-21 – 2021-04-22 (×2): 1 via ORAL
  Filled 2021-04-21 (×2): qty 1

## 2021-04-21 MED ORDER — CARVEDILOL 12.5 MG PO TABS
12.5000 mg | ORAL_TABLET | Freq: Two times a day (BID) | ORAL | Status: DC
  Administered 2021-04-21 – 2021-04-22 (×2): 12.5 mg via ORAL
  Filled 2021-04-21 (×2): qty 1

## 2021-04-21 MED ORDER — POLYETHYLENE GLYCOL 3350 17 G PO PACK: 17.0000 g | PACK | Freq: Every day | ORAL | Status: DC | PRN

## 2021-04-21 MED ORDER — ACETAMINOPHEN 650 MG RE SUPP: 650.0000 mg | Freq: Four times a day (QID) | RECTAL | Status: DC | PRN

## 2021-04-21 MED ORDER — EZETIMIBE 10 MG PO TABS
10.0000 mg | ORAL_TABLET | Freq: Every day | ORAL | Status: DC
  Administered 2021-04-21 – 2021-04-22 (×2): 10 mg via ORAL
  Filled 2021-04-21 (×2): qty 1

## 2021-04-21 MED ORDER — BUPROPION HCL ER (XL) 300 MG PO TB24
300.0000 mg | ORAL_TABLET | Freq: Every day | ORAL | Status: DC
  Administered 2021-04-21 – 2021-04-22 (×2): 300 mg via ORAL
  Filled 2021-04-21: qty 1
  Filled 2021-04-21: qty 2

## 2021-04-21 MED ORDER — ATORVASTATIN CALCIUM 10 MG PO TABS
10.0000 mg | ORAL_TABLET | Freq: Every day | ORAL | Status: DC
  Administered 2021-04-21: 10 mg via ORAL
  Filled 2021-04-21: qty 1

## 2021-04-21 MED ORDER — HALOPERIDOL LACTATE 5 MG/ML IJ SOLN
2.0000 mg | Freq: Four times a day (QID) | INTRAMUSCULAR | Status: DC | PRN
  Administered 2021-04-21: 2 mg via INTRAMUSCULAR
  Filled 2021-04-21 (×2): qty 1

## 2021-04-21 MED ORDER — DOXYCYCLINE HYCLATE 100 MG PO TABS
100.0000 mg | ORAL_TABLET | Freq: Two times a day (BID) | ORAL | Status: DC
  Administered 2021-04-21: 100 mg via ORAL
  Filled 2021-04-21: qty 1

## 2021-04-21 MED ORDER — GUAIFENESIN 100 MG/5ML PO SOLN: 200.0000 mg | ORAL | Status: DC | PRN

## 2021-04-21 MED ORDER — NYSTATIN 100000 UNIT/GM EX POWD
1.0000 "application " | Freq: Two times a day (BID) | CUTANEOUS | Status: DC
  Administered 2021-04-21 – 2021-04-22 (×2): 1 via TOPICAL
  Filled 2021-04-21: qty 15

## 2021-04-21 MED ORDER — RIVAROXABAN 20 MG PO TABS: 20.0000 mg | ORAL_TABLET | Freq: Every day | ORAL | Status: DC

## 2021-04-21 MED ORDER — GABAPENTIN 300 MG PO CAPS: 300.0000 mg | ORAL_CAPSULE | Freq: Every day | ORAL | Status: DC

## 2021-04-21 MED ORDER — SODIUM CHLORIDE 0.9 % IV SOLN: 1.0000 g | INTRAVENOUS | Status: DC

## 2021-04-21 MED ORDER — GABAPENTIN 300 MG PO CAPS
600.0000 mg | ORAL_CAPSULE | Freq: Every day | ORAL | Status: DC
  Administered 2021-04-21: 600 mg via ORAL
  Filled 2021-04-21: qty 2

## 2021-04-21 MED ORDER — INSULIN ASPART 100 UNIT/ML IJ SOLN
0.0000 [IU] | Freq: Three times a day (TID) | INTRAMUSCULAR | Status: DC
  Administered 2021-04-22: 2 [IU] via SUBCUTANEOUS
  Filled 2021-04-21: qty 0.09

## 2021-04-21 MED ORDER — DOXYCYCLINE MONOHYDRATE 100 MG PO TABS: 100.0000 mg | ORAL_TABLET | Freq: Two times a day (BID) | ORAL | Status: DC

## 2021-04-21 MED ORDER — VITAMIN D 50 MCG (2000 UT) PO TABS: 2000.0000 [IU] | ORAL_TABLET | Freq: Every day | ORAL | Status: DC

## 2021-04-21 MED ORDER — SERTRALINE HCL 100 MG PO TABS
100.0000 mg | ORAL_TABLET | Freq: Every day | ORAL | Status: DC
  Administered 2021-04-21 – 2021-04-22 (×2): 100 mg via ORAL
  Filled 2021-04-21: qty 1
  Filled 2021-04-21: qty 2

## 2021-04-21 MED ORDER — GABAPENTIN 300 MG PO CAPS
300.0000 mg | ORAL_CAPSULE | Freq: Two times a day (BID) | ORAL | Status: DC
  Administered 2021-04-21 – 2021-04-22 (×3): 300 mg via ORAL
  Filled 2021-04-21 (×3): qty 1

## 2021-04-21 NOTE — Progress Notes (Signed)
Delay in accepting patient d/t request of updated VS and recent temp.

## 2021-04-21 NOTE — Progress Notes (Signed)
NEUROSURGERY PROGRESS NOTE  Called in regards to this patients head ct and cervical spine ct. Trace SDH on the left, no surgical intervention warranted for this. This is certainly unlikely to be the cause of her mental status change. Left lateral C1 ring fracture. Would recommend aspen collar for this. Also recommend CT thoracic spine to further assess thoracic fractures.   Temp:  [97.6 F (36.4 C)] 97.6 F (36.4 C) (08/15 0834) Pulse Rate:  [79-81] 80 (08/15 1030) Resp:  [12-18] 15 (08/15 1030) BP: (112-174)/(93-101) 163/101 (08/15 1030) SpO2:  [100 %] 100 % (08/15 1030)   Dawn Chiquito, NP 04/21/2021 11:11 AM

## 2021-04-21 NOTE — ED Triage Notes (Signed)
BIBA, Per  EMS- AMS since this morning, recent fall five days ago with with brusing to r side of head. Patient takes blood thinners. Upon EMS arrival patient combative, haldol and versed given. Possible UTI, possible sepsis.  BP: 120/77 HR: 50-80 Afib Spo2: 96% on 4L simple mask. Wears 2L at home. CBG: 208

## 2021-04-21 NOTE — ED Notes (Signed)
Julian at 2L and [place] J cervical collar applied

## 2021-04-21 NOTE — ED Provider Notes (Signed)
[facility] DEPT Provider Note   CSN: [id number] Arrival date & time: 04/21/21  0800     History Chief Complaint  Patient presents with   Altered Mental Status   Fall    Hit Head; blood thinners    Dawn Morrow is a 85 y.o. female.  HPI    85 year old female comes in with chief complaint of altered mental status and fall. Patient has history of DVT/PE, diabetes, COPD and is on Xarelto per our records. Has dementia - but ambulatory, is functional and follows command.   Per EMS and [facility] (Ms. Suanne Marker) - Pt started having some confusion prior to the fall 5 days ago. Initially, she was alert and talking after the fall and she was getting frequent neuro checks. Gradually confusion worsened and today it was at it's worst. Pt has been coughing too per SNF and CXR yday showed infiltrate for which she was started on doxy last night.  Today she was agitated and screaming, combative - so EMS called.  Spoke with patient's nephew, he reports that his sister has medical POA.  I tried to call Ms. Blankenship at [phone].  There was no response.  Level 5 caveat for altered mental status.  Past Medical History:  Diagnosis Date   Anxiety and depression    Asthma    Cataract    COPD (chronic obstructive pulmonary disease) ([place])    Diabetes mellitus    Diverticulosis    DVT (deep vein thrombosis) in pregnancy    hx x multiple on coumadin   GERD (gastroesophageal reflux disease)    Hemorrhoids    Hiatal hernia    Hyperlipidemia    Hypertension    Hypothyroidism    On home oxygen therapy    "2L; 24/7" (09/17/2017)   Osteoarthritis    h/o spinal stenosis by MRI 2009   Osteopenia    Pulmonary embolism (HCC)    x 2   Stroke [facility])    MINI   Tubular adenoma of colon 07/2005    Patient Active Problem List   Diagnosis Date Noted   UTI (urinary tract infection) 07/31/2020   CAP (community acquired pneumonia) 07/31/2020   Pressure injury of skin  07/31/2020   Malnutrition of moderate degree 07/19/2020   Adjustment disorder with depressed mood 07/13/2020   Dehydration 07/12/2020   Fall [id number]   Acute metabolic encephalopathy [id number]   AMS (altered mental status) 03/26/2020   History of pulmonary embolism 03/26/2020   History of DVT (deep vein thrombosis) 03/26/2020   Atrial fibrillation, chronic ([place]) 03/26/2020   Sepsis ([place]) 12/14/2019   Acute encephalopathy 12/14/2019   COVID-19 virus infection 10/02/2019   Acute lower UTI 10/02/2019   AKI (acute kidney injury) ([place]) 10/01/2019   Syncope 09/18/2017   Near syncope 09/17/2017   Diabetes mellitus type 2 in obese ([place]) 09/17/2017   Atrial fibrillation with RVR ([place]) 09/17/2017   Hypomagnesemia 09/17/2017   CKD (chronic kidney disease), stage III ([place]) 09/17/2017   Forehead contusion    MGUS (monoclonal gammopathy of unknown significance) 08/13/2016   Renal failure 07/09/2015   Follow-up ---------PCP NOTES 05/17/2015   Essential tremor 12/28/2014   Ulnar neuropathy of left upper extremity 12/28/2014   Intractable pain 06/21/2014   Back pain 06/21/2014   Lumbar radiculopathy 06/21/2014   Encounter for therapeutic drug monitoring 03/14/2014   Numbness 10/18/2013   TIA (transient ischemic attack) 09/19/2013   Weakness 09/18/2013   Annual physical exam 05/30/2012   Tremor 01/21/2012  Failure to thrive and poor med compliance  01/21/2012   Pulmonary embolism ([place]) 11/18/2010   DVT (deep venous thrombosis) ([place]) 11/18/2010   Long term current use of anticoagulant 11/18/2010   ABNORMAL ELECTROCARDIOGRAM 11/10/2010   OSTEOARTHRITIS 08/06/2010   DIZZINESS 04/15/2010   Diabetes ([place]) 06/04/2009   ACNE ROSACEA 11/28/2008   BACK PAIN 05/16/2008   HIP PAIN, RIGHT, CHRONIC 09/15/2007   Hypothyroidism 09/13/2007   Dyslipidemia 09/13/2007   Osteoporosis 07/22/2007   DEPRESSION 09/11/2006   HTN (hypertension) 09/11/2006   ASTHMA 09/11/2006   COPD (chronic obstructive  pulmonary disease) ([place]) 09/11/2006   GERD 09/11/2006   Recurrent UTI 09/11/2006   [place] FILTER INSERTION, HX OF 09/11/2006    Past Surgical History:  Procedure Laterality Date   APPENDECTOMY     CATARACT EXTRACTION     CHOLECYSTECTOMY     LUMBAR FUSION     POLYPECTOMY     TONSILLECTOMY     TUBAL LIGATION       OB History   No obstetric history on file.     Family History  Adopted: Yes  Problem Relation Age of Onset   Cancer Mother        ? colon or ovarian   Diabetes Mother    Cancer Brother        ?   CAD Neg Hx     Social History   Tobacco Use   Smoking status: Former    Types: Cigarettes   Smokeless tobacco: Never  Vaping Use   Vaping Use: Never used  Substance Use Topics   Alcohol use: No    Comment: socially   Drug use: No    Home Medications Prior to Admission medications   Medication Sig Start Date End Date Taking? Authorizing Provider  acetaminophen (TYLENOL) 500 MG tablet Take 500 mg by mouth every 4 (four) hours as needed for mild pain.   Yes [provider]  albuterol (PROVENTIL HFA;VENTOLIN HFA) 108 (90 BASE) MCG/ACT inhaler Inhale 2 puffs into the lungs every 6 (six) hours as needed for wheezing or shortness of breath. 08/05/15  Yes Saguier, Percell Miller, PA-C  atorvastatin (LIPITOR) 10 MG tablet Take 1 tablet (10 mg total) by mouth at bedtime. 10/21/15  Yes Paz, Alda Berthold, MD  buPROPion (WELLBUTRIN XL) 300 MG 24 hr tablet Take 1 tablet (300 mg total) by mouth daily. 07/20/20  Yes [name], MD  carvedilol (COREG) 12.5 MG tablet Take 1 tablet (12.5 mg total) by mouth 2 (two) times daily with a meal. 05/06/20  Yes Dessa Phi, DO  Cholecalciferol (VITAMIN D) 50 MCG (2000 UT) tablet Take 2,000 Units by mouth daily.   Yes [provider]  docusate sodium (COLACE) 100 MG capsule Take 100 mg by mouth 2 (two) times daily.    Yes [provider]  doxycycline (ADOXA) 100 MG tablet Take 100 mg by mouth 2 (two) times daily.  For 10 days for pneumonia 04/21/21 04/30/21 Yes [provider]  ezetimibe (ZETIA) 10 MG tablet Take 10 mg by mouth daily.   Yes [provider]  gabapentin (NEURONTIN) 300 MG capsule Take 1 cap in AM, 1 cap at noon, 2 caps at bedtime Patient taking differently: Take 300-600 mg by mouth at bedtime. '300mg'$  in the morning, '300mg'$  at noon, and '600mg'$  at bedtime 12/21/14  Yes Cameron Sprang, MD  guaifenesin (ROBITUSSIN) 100 MG/5ML syrup Take 200 mg by mouth every 4 (four) hours as needed for cough.   Yes [provider]  Insulin Glargine (BASAGLAR KWIKPEN) 100 UNIT/ML Inject 5 Units into the skin at bedtime. 07/19/20  Yes [name], MD  ketoconazole (NIZORAL) 2 % shampoo Apply 1 application topically See admin instructions. Use topically to shampoo hair twice weekly - Tuesday and Friday   Yes [provider]  levothyroxine (SYNTHROID) 88 MCG tablet Take 88 mcg by mouth daily before breakfast.   Yes [provider]  meclizine (ANTIVERT) 25 MG tablet Take 25 mg by mouth at bedtime as needed for dizziness.   Yes [provider]  Multiple Vitamin (MULTIVITAMIN WITH MINERALS) TABS tablet Take 1 tablet by mouth daily. 07/20/20  Yes [name], MD  nystatin (MYCOSTATIN/NYSTOP) powder Apply 1 application topically 2 (two) times daily.   Yes [provider]  ondansetron (ZOFRAN) 4 MG tablet Take 4 mg by mouth every 8 (eight) hours as needed for nausea or vomiting.   Yes [provider]  OXYGEN Inhale 3 L into the lungs continuous.   Yes [provider]  polyethylene glycol (MIRALAX / GLYCOLAX) 17 g packet Take 17 g by mouth daily as needed for mild constipation or moderate constipation.   Yes [provider]  Propylene Glycol 0.6 % SOLN Place 1 drop into both eyes 4 (four) times daily as needed (for dry eyes).    Yes [provider]  rivaroxaban (XARELTO) 20 MG TABS tablet Take 20 mg by mouth daily with supper.    Yes [provider]  sertraline (ZOLOFT) 100 MG tablet Take 1 tablet (100 mg total) by mouth daily. 07/20/20  Yes [name], MD  clindamycin (CLEOCIN) 300 MG capsule Take 1 capsule (300 mg total) by mouth 3 (three) times daily. Patient not taking: Reported on 07/02/2017 06/17/17 07/02/17  Colon Branch, MD    Allergies    Penicillins, Penicillins, Codeine, Codeine, Sulfa antibiotics, Sulfonylureas, Sulfonylureas, and Sulfonamide derivatives  Review of Systems   Review of Systems  Unable to perform ROS: Mental status change   Physical Exam Updated Vital Signs BP (!) 146/106   Pulse 78   Temp 97.6 F (36.4 C) (Oral)   Resp 19   SpO2 97%   Physical Exam Vitals and nursing note reviewed.  Constitutional:      Appearance: She is well-developed.     Comments: somnolent  HENT:     Head: Atraumatic.  Eyes:     Pupils: Pupils are equal, round, and reactive to light.  Cardiovascular:     Rate and Rhythm: Tachycardia present.  Pulmonary:     Effort: Pulmonary effort is normal.  Abdominal:     Tenderness: There is no abdominal tenderness.  Musculoskeletal:        General: No deformity.  Skin:    General: Skin is warm and dry.  Neurological:     Mental Status: She is disoriented.    ED Results / Procedures / Treatments   Labs (all labs ordered are listed, but only abnormal results are displayed) Labs Reviewed  COMPREHENSIVE METABOLIC PANEL - Abnormal; Notable for the following components:      Result Value   Chloride 97 (*)    Glucose, Bld 161 (*)    BUN 28 (*)    Creatinine, Ser 1.12 (*)    GFR, Estimated 49 (*)    All other components within normal limits  CBC WITH DIFFERENTIAL/PLATELET - Abnormal; Notable for the following components:   WBC 12.2 (*)    Neutro Abs 9.5 (*)    All other components within  normal limits  PROTIME-INR - Abnormal; Notable for the following components:   Prothrombin Time 16.0 (*)    INR 1.3 (*)    All other components  within normal limits  CBG MONITORING, ED - Abnormal; Notable for the following components:   Glucose-Capillary 140 (*)    All other components within normal limits  SARS CORONAVIRUS 2 (TAT 6-24 HRS)  AMMONIA  APTT  URINALYSIS, COMPLETE (UACMP) WITH MICROSCOPIC    EKG EKG Interpretation  Date/Time:  Monday April 21 2021 08:26:39 EDT Ventricular Rate:  82 PR Interval:    QRS Duration: 85 QT Interval:  353 QTC Calculation: 413 R Axis:   -7 Text Interpretation: Atrial fibrillation Borderline repolarization abnormality No acute changes No significant change since last tracing Confirmed by Varney Biles [phone]) on 04/21/2021 11:51:04 AM  Radiology CT HEAD WO CONTRAST  Addendum Date: 04/21/2021   ADDENDUM REPORT: 04/21/2021 10:09 ADDENDUM: Critical Value/emergent results were called by telephone at the time of interpretation on 04/21/2021 at 10:06 am to Dr Matilde Sprang, who verbally acknowledged these results. Electronically Signed   By: Genevie Ann M.D.   On: 04/21/2021 10:09   Result Date: 04/21/2021 CLINICAL DATA:  85 year old female with altered mental status this morning. Fell 5 days ago on blood thinners. Combative. EXAM: CT HEAD WITHOUT CONTRAST TECHNIQUE: Contiguous axial images were obtained from the base of the skull through the vertex without intravenous contrast. COMPARISON:  Brain MRI 12/16/2019.  Head CT 01/25/2021. FINDINGS: Brain: Stable cerebral volume. No midline shift, ventriculomegaly, evidence of mass lesion, or evidence of cortically based acute infarction. Confluent bilateral cerebral white matter hypodensity is associated with bilateral basal ganglia and left thalamic heterogeneity and appears stable since May. No cortical encephalomalacia identified. But on coronal image 39 there is evidence of a trace hyperdense subdural hematoma along the floor of the left middle cranial fossa. This is 1-2 mm. No associated mass effect. No other intracranial hemorrhage identified. Vascular:  Calcified atherosclerosis at the skull base. There is a small round aneurysm at the left MCA bifurcation estimated at 6 mm on series 2, image 13, and not significantly changed from a 2015 intracranial MRA (5-6 mm at that time). Skull: Stable, intact. However, there is a mildly comminuted left C1 ring fracture which is minimally displaced on series 3, image 2. Sinuses/Orbits: Visualized paranasal sinuses and mastoids are clear. Other: Mild anterior vertex scalp hematoma or contusion on series 3, image 49. Underlying calvarium intact. Other orbit and scalp soft tissues appears stable. IMPRESSION: 1. Partially visible left C1 ring fracture. See Cervical Spine reported separately. 2. Trace acute Subdural Hematoma suspected along the floor of the Left middle cranial fossa. No associated mass effect. 3. Mild right vertex scalp hematoma.  No skull fracture identified. 4. No other acute intracranial abnormality. There is a chronic unruptured Left MCA bifurcation Aneurysm, 6 mm. And advanced chronic small vessel disease. Electronically Signed: By: Genevie Ann M.D. On: 04/21/2021 10:00   CT CERVICAL SPINE WO CONTRAST  Result Date: 04/21/2021 CLINICAL DATA:  85 year old female with altered mental status this morning. Fell 5 days ago on blood thinners. Combative. EXAM: CT CERVICAL SPINE WITHOUT CONTRAST TECHNIQUE: Multidetector CT imaging of the cervical spine was performed without intravenous contrast. Multiplanar CT image reconstructions were also generated. COMPARISON:  Head CT today reported separately. Cervical spine CT 01/25/2021. FINDINGS: Alignment: Stable cervical lordosis. Cervicothoracic junction alignment is within normal limits. Bilateral posterior element alignment is within normal limits. Skull base and vertebrae: Visualized skull base is intact.  No atlanto-occipital dissociation. However, there is an oblique and mildly comminuted fracture through the left lateral C1 ring involving the lateral mass. Minimal  displacement. Elsewhere the C1 ring remains intact. There is minimal rotary subluxation of C1 on C2. Atlanto dens interval remains normal. C2 appears stable and intact. Other cervical vertebrae appear stable and intact, including bulky bridging anterior endplate osteophytosis at both C4-C5 and C6-C7. No other acute cervical spine fracture identified. Soft tissues and spinal canal: No prevertebral fluid or swelling. No visible canal hematoma. Bulky calcified carotid atherosclerosis on the right. Otherwise negative noncontrast visible neck soft tissues. Disc levels: Cervical spine degeneration appears stable since May. Chronic interbody ankylosis at C4-C5 and C6-C7 due to bulky flowing osteophytes. Chronic absent ankylosis at C5-C6. Upper chest: Mild T1 superior endplate compression is stable. But there is a new fracture through the anterior bridging T1-T2 osteophyte on sagittal image 25. This is nondisplaced. Mild T2 superior endplate compression is not definitely changed. The T1 and T2 posterior elements appear intact. Mild septal thickening in the lung apices. IMPRESSION: 1. Oblique, mildly comminuted fracture of the left lateral C1 ring with minimal displacement and minimal rotary subluxation of C1 on C2. 2. Acute fracture through an anterior bridging T1-T2 osteophyte. No displacement. Mild superior endplate compression at both T1 and T2 appears to be chronic and stable. 3. No other acute traumatic injury identified in the cervical spine. 4. Chronic cervical spine ankylosis at C4-C5 and C6-C7 due to Diffuse idiopathic skeletal hyperostosis (DISH). Critical Value/emergent results were called by telephone at the time of interpretation on 04/21/2021 at 10:06 am to Dr Matilde Sprang, who verbally acknowledged these results. Electronically Signed   By: Genevie Ann M.D.   On: 04/21/2021 10:09    Procedures .Critical Care  Date/Time: 04/21/2021 11:52 AM Performed by: Varney Biles, MD Authorized by: Varney Biles, MD    Critical care provider statement:    Critical care time (minutes):  118   Critical care was necessary to treat or prevent imminent or life-threatening deterioration of the following conditions:  CNS failure or compromise   Critical care was time spent personally by me on the following activities:  Discussions with consultants, evaluation of patient's response to treatment, examination of patient, ordering and performing treatments and interventions, ordering and review of laboratory studies, ordering and review of radiographic studies, pulse oximetry, re-evaluation of patient's condition, obtaining history from patient or surrogate and review of old charts   Medications Ordered in ED Medications - No data to display  ED Course  I have reviewed the triage vital signs and the nursing notes.  Pertinent labs & imaging results that were available during my care of the patient were reviewed by me and considered in my medical decision making (see chart for details).  Clinical Course as of 04/21/21 1214  Mon Apr 21, 2021  1006 C1 ring fracture, mildly communted; T1-T2 osteophyte fx non-displaced; CTH: trace SDH L middle cranial fossa [MK]    Clinical Course User Index [MK] Kommor, [place], MD   MDM Rules/Calculators/A&P                           85 year old female comes in with chief complaint of altered mental status.  DDx includes: ICH / Stroke ACS Sepsis syndrome Infection - UTI/Pneumonia Encephalopathy  Electrolyte abnormality Drug overdose DKA Metabolic disorders including thyroid disorders, adrenal insufficiency Cancer of unknown origin / paraneoplastic process  With her having fall few days ago and  a bruise to her forehead, suspicion for ICH is high.  CT C-spine also ordered as I cannot clear that clinically.  Reassessment: Reassessed the patient at 10:30 AM.  CT scan reveals subdural hematoma and a cervical spine fracture.  Aspen collar placed.  Mental status unchanged.   Still not responding deliberately to any stimuli. Called [facility] for further information Still have difficulty getting in touch with patient's medical POA, Ms. Wayne Both.  The nephew, has been given my phone number.  Reassessment: Spoke with Dr. Saintclair Halsted, neurosurgery. He has reviewed the imaging and is aware of patient being on Xarelto.  Given that the fall occurred 5 days ago, he does not recommend reversing the Xarelto at this time.  Subdural hematoma is small.  For C-spine he recommends cervical collar that has been placed.  No acute neurosurgical intervention needed per Dr. Saintclair Halsted.  Recommends admission to medicine for altered mental status if needed.     Final Clinical Impression(s) / ED Diagnoses Final diagnoses:  Closed displaced fracture of first cervical vertebra, unspecified fracture morphology, initial encounter ([place])  SDH (subdural hematoma) ([place])    Rx / DC Orders ED Discharge Orders     None        Varney Biles, MD 04/21/21 1214

## 2021-04-21 NOTE — H&P (Signed)
History and Physical    Dawn Morrow [id number] DOB: [birth year] DOA: 04/21/2021  PCP: Wenda Low, MD (Confirm with patient/family/NH records and if not entered, this has to be entered at [facility] point of entry) Patient coming from: Home  I have personally briefly reviewed patient's old medical records in [place]  Chief Complaint: Leave me alone  HPI: Dawn Morrow is a 85 y.o. female with medical history significant of severe dementia, frequent falls, COPD, IDDM, PAF on Xarelto, sent from nursing home for new onset of mentation changes.  Patient has chronic ambulation dysfunction and frequent falls, she had another for 5 days ago and nursing home.  This morning, patient became very confused and combative, nursing home considered she had a UTI and started on doxycycline and sent her to the ED.  According to nursing home record, patient was coughing last night and chest x-ray showed new infiltrates and patient was started on doxycycline this morning.  However patient woke up this morning and developed confusion and agitations.  ED Course: CT head showed trace acute subdural hematoma along the floor of left middle cranial fossa, left C1 ring fracture with minimal displacement and minimal rotatory subluxation, patient was placed on neck collar, neurosurgery recommend conservative management.  Review of Systems: Unable to perform, patient confused.  Past Medical History:  Diagnosis Date   Anxiety and depression    Asthma    Cataract    COPD (chronic obstructive pulmonary disease) ([place])    Diabetes mellitus    Diverticulosis    DVT (deep vein thrombosis) in pregnancy    hx x multiple on coumadin   GERD (gastroesophageal reflux disease)    Hemorrhoids    Hiatal hernia    Hyperlipidemia    Hypertension    Hypothyroidism    On home oxygen therapy    "2L; 24/7" (09/17/2017)   Osteoarthritis    h/o spinal stenosis by MRI 2009   Osteopenia    Pulmonary embolism (HCC)    x 2    Stroke [facility])    MINI   Tubular adenoma of colon 07/2005    Past Surgical History:  Procedure Laterality Date   APPENDECTOMY     CATARACT EXTRACTION     CHOLECYSTECTOMY     LUMBAR FUSION     POLYPECTOMY     TONSILLECTOMY     TUBAL LIGATION       reports that she has quit smoking. Her smoking use included cigarettes. She has never used smokeless tobacco. She reports that she does not drink alcohol and does not use drugs.  Allergies  Allergen Reactions   Penicillins Anaphylaxis   Penicillins Anaphylaxis    Per duplicate chart   Codeine Other (See Comments)    Listed on MAR   Codeine Other (See Comments)    Unknown reaction   Sulfa Antibiotics Other (See Comments)    Unknown reaction   Sulfonylureas Other (See Comments)    Listed on MAR   Sulfonylureas Other (See Comments)    Unknown reaction   Sulfonamide Derivatives Other (See Comments)    Unknown reaction    Family History  Adopted: Yes  Problem Relation Age of Onset   Cancer Mother        ? colon or ovarian   Diabetes Mother    Cancer Brother        ?   CAD Neg Hx      Prior to Admission medications   Medication Sig Start Date End  Date Taking? Authorizing Provider  acetaminophen (TYLENOL) 500 MG tablet Take 500 mg by mouth every 4 (four) hours as needed for mild pain.   Yes [provider]  albuterol (PROVENTIL HFA;VENTOLIN HFA) 108 (90 BASE) MCG/ACT inhaler Inhale 2 puffs into the lungs every 6 (six) hours as needed for wheezing or shortness of breath. 08/05/15  Yes Saguier, Percell Miller, PA-C  atorvastatin (LIPITOR) 10 MG tablet Take 1 tablet (10 mg total) by mouth at bedtime. 10/21/15  Yes Paz, Alda Berthold, MD  buPROPion (WELLBUTRIN XL) 300 MG 24 hr tablet Take 1 tablet (300 mg total) by mouth daily. 07/20/20  Yes [name], MD  carvedilol (COREG) 12.5 MG tablet Take 1 tablet (12.5 mg total) by mouth 2 (two) times daily with a meal. 05/06/20  Yes Dessa Phi, DO  Cholecalciferol (VITAMIN D) 50 MCG (2000  UT) tablet Take 2,000 Units by mouth daily.   Yes [provider]  docusate sodium (COLACE) 100 MG capsule Take 100 mg by mouth 2 (two) times daily.    Yes [provider]  doxycycline (ADOXA) 100 MG tablet Take 100 mg by mouth 2 (two) times daily. For 10 days for pneumonia 04/21/21 04/30/21 Yes [provider]  ezetimibe (ZETIA) 10 MG tablet Take 10 mg by mouth daily.   Yes [provider]  gabapentin (NEURONTIN) 300 MG capsule Take 1 cap in AM, 1 cap at noon, 2 caps at bedtime Patient taking differently: Take 300-600 mg by mouth at bedtime. '300mg'$  in the morning, '300mg'$  at noon, and '600mg'$  at bedtime 12/21/14  Yes Cameron Sprang, MD  guaifenesin (ROBITUSSIN) 100 MG/5ML syrup Take 200 mg by mouth every 4 (four) hours as needed for cough.   Yes [provider]  Insulin Glargine (BASAGLAR KWIKPEN) 100 UNIT/ML Inject 5 Units into the skin at bedtime. 07/19/20  Yes [name], MD  ketoconazole (NIZORAL) 2 % shampoo Apply 1 application topically See admin instructions. Use topically to shampoo hair twice weekly - Tuesday and Friday   Yes [provider]  levothyroxine (SYNTHROID) 88 MCG tablet Take 88 mcg by mouth daily before breakfast.   Yes [provider]  meclizine (ANTIVERT) 25 MG tablet Take 25 mg by mouth at bedtime as needed for dizziness.   Yes [provider]  Multiple Vitamin (MULTIVITAMIN WITH MINERALS) TABS tablet Take 1 tablet by mouth daily. 07/20/20  Yes [name], MD  nystatin (MYCOSTATIN/NYSTOP) powder Apply 1 application topically 2 (two) times daily.   Yes [provider]  ondansetron (ZOFRAN) 4 MG tablet Take 4 mg by mouth every 8 (eight) hours as needed for nausea or vomiting.   Yes [provider]  OXYGEN Inhale 3 L into the lungs continuous.   Yes [provider]  polyethylene glycol (MIRALAX / GLYCOLAX) 17 g packet Take 17 g by mouth daily as needed for mild constipation or  moderate constipation.   Yes [provider]  Propylene Glycol 0.6 % SOLN Place 1 drop into both eyes 4 (four) times daily as needed (for dry eyes).    Yes [provider]  rivaroxaban (XARELTO) 20 MG TABS tablet Take 20 mg by mouth daily with supper.   Yes [provider]  sertraline (ZOLOFT) 100 MG tablet Take 1 tablet (100 mg total) by mouth daily. 07/20/20  Yes [name], MD  clindamycin (CLEOCIN) 300 MG capsule Take 1 capsule (300 mg total) by mouth 3 (three) times daily. Patient not taking: Reported on 07/02/2017 06/17/17 07/02/17  Colon Branch, MD    Physical Exam: Vitals:   04/21/21 1030 04/21/21 1145 04/21/21 1230 04/21/21 1313  BP: (!) 163/101 (!) 146/106 (!) 175/103 (!) 178/95  Pulse: 80 78  78  Resp: '15 19 14 18  '$ Temp:      TempSrc:      SpO2: 100% 97%  95%    Constitutional: NAD, calm, comfortable Vitals:   04/21/21 1030 04/21/21 1145 04/21/21 1230 04/21/21 1313  BP: (!) 163/101 (!) 146/106 (!) 175/103 (!) 178/95  Pulse: 80 78  78  Resp: '15 19 14 18  '$ Temp:      TempSrc:      SpO2: 100% 97%  95%   Eyes: PERRL, lids and conjunctivae normal ENMT: Mucous membranes are moist. Posterior pharynx clear of any exudate or lesions.Normal dentition.  Neck: normal, supple, no masses, no thyromegaly, in neck collar. Respiratory: clear to auscultation bilaterally, no wheezing, no crackles. Normal respiratory effort. No accessory muscle use.  Cardiovascular: Regular rate and rhythm, no murmurs / rubs / gallops. No extremity edema. 2+ pedal pulses. No carotid bruits.  Abdomen: no tenderness, no masses palpated. No hepatosplenomegaly. Bowel sounds positive.  Musculoskeletal: no clubbing / cyanosis. No joint deformity upper and lower extremities. Good ROM, no contractures. Normal muscle tone.  Skin: no rashes, lesions, ulcers. No induration Neurologic: No facial droops, bilateral muscle strength symmetrical 5/5, moving all limbs, not following  commands. Psychiatric: Oriented to herself, confused and agitated    Labs on Admission: I have personally reviewed following labs and imaging studies  CBC: Recent Labs  Lab 04/21/21 0854  WBC 12.2*  NEUTROABS 9.5*  HGB 13.1  HCT 42.0  MCV 95.5  PLT [id number]   Basic Metabolic Panel: Recent Labs  Lab 04/21/21 0854  NA 140  K 4.0  CL 97*  CO2 32  GLUCOSE 161*  BUN 28*  CREATININE 1.12*  CALCIUM 9.4   GFR: CrCl cannot be calculated (Unknown ideal weight.). Liver Function Tests: Recent Labs  Lab 04/21/21 0854  AST 23  ALT 20  ALKPHOS 79  BILITOT 1.0  PROT 7.4  ALBUMIN 3.7   No results for input(s): LIPASE, AMYLASE in the last 168 hours. Recent Labs  Lab 04/21/21 0845  AMMONIA 25   Coagulation Profile: Recent Labs  Lab 04/21/21 0854  INR 1.3*   Cardiac Enzymes: No results for input(s): CKTOTAL, CKMB, CKMBINDEX, TROPONINI in the last 168 hours. BNP (last 3 results) No results for input(s): PROBNP in the last 8760 hours. HbA1C: No results for input(s): HGBA1C in the last 72 hours. CBG: Recent Labs  Lab 04/21/21 0910  GLUCAP 140*   Lipid Profile: No results for input(s): CHOL, HDL, LDLCALC, TRIG, CHOLHDL, LDLDIRECT in the last 72 hours. Thyroid Function Tests: No results for input(s): TSH, T4TOTAL, FREET4, T3FREE, THYROIDAB in the last 72 hours. Anemia Panel: No results for input(s): VITAMINB12, FOLATE, FERRITIN, TIBC, IRON, RETICCTPCT in the last 72 hours. Urine analysis:    Component Value Date/Time   COLORURINE YELLOW 08/12/2020 0032   APPEARANCEUR HAZY (A) 08/12/2020 0032   LABSPEC 1.018 08/12/2020 0032   PHURINE 6.0 08/12/2020 0032   GLUCOSEU NEGATIVE 08/12/2020 0032   GLUCOSEU NEGATIVE 06/12/2014 1410   HGBUR NEGATIVE 08/12/2020 0032   BILIRUBINUR NEGATIVE 08/12/2020 0032   BILIRUBINUR Neg 06/19/2016 1101   KETONESUR NEGATIVE 08/12/2020 0032   PROTEINUR NEGATIVE 08/12/2020 0032   UROBILINOGEN >=8.0 06/19/2016 1101   UROBILINOGEN 0.2  05/31/2015 1835   NITRITE NEGATIVE 08/12/2020 0032  LEUKOCYTESUR NEGATIVE 08/12/2020 0032    Radiological Exams on Admission: CT HEAD WO CONTRAST  Addendum Date: 04/21/2021   ADDENDUM REPORT: 04/21/2021 10:09 ADDENDUM: Critical Value/emergent results were called by telephone at the time of interpretation on 04/21/2021 at 10:06 am to Dr Matilde Sprang, who verbally acknowledged these results. Electronically Signed   By: Genevie Ann M.D.   On: 04/21/2021 10:09   Result Date: 04/21/2021 CLINICAL DATA:  85 year old female with altered mental status this morning. Fell 5 days ago on blood thinners. Combative. EXAM: CT HEAD WITHOUT CONTRAST TECHNIQUE: Contiguous axial images were obtained from the base of the skull through the vertex without intravenous contrast. COMPARISON:  Brain MRI 12/16/2019.  Head CT 01/25/2021. FINDINGS: Brain: Stable cerebral volume. No midline shift, ventriculomegaly, evidence of mass lesion, or evidence of cortically based acute infarction. Confluent bilateral cerebral white matter hypodensity is associated with bilateral basal ganglia and left thalamic heterogeneity and appears stable since May. No cortical encephalomalacia identified. But on coronal image 39 there is evidence of a trace hyperdense subdural hematoma along the floor of the left middle cranial fossa. This is 1-2 mm. No associated mass effect. No other intracranial hemorrhage identified. Vascular: Calcified atherosclerosis at the skull base. There is a small round aneurysm at the left MCA bifurcation estimated at 6 mm on series 2, image 13, and not significantly changed from a 2015 intracranial MRA (5-6 mm at that time). Skull: Stable, intact. However, there is a mildly comminuted left C1 ring fracture which is minimally displaced on series 3, image 2. Sinuses/Orbits: Visualized paranasal sinuses and mastoids are clear. Other: Mild anterior vertex scalp hematoma or contusion on series 3, image 49. Underlying calvarium intact. Other  orbit and scalp soft tissues appears stable. IMPRESSION: 1. Partially visible left C1 ring fracture. See Cervical Spine reported separately. 2. Trace acute Subdural Hematoma suspected along the floor of the Left middle cranial fossa. No associated mass effect. 3. Mild right vertex scalp hematoma.  No skull fracture identified. 4. No other acute intracranial abnormality. There is a chronic unruptured Left MCA bifurcation Aneurysm, 6 mm. And advanced chronic small vessel disease. Electronically Signed: By: Genevie Ann M.D. On: 04/21/2021 10:00   CT CERVICAL SPINE WO CONTRAST  Result Date: 04/21/2021 CLINICAL DATA:  85 year old female with altered mental status this morning. Fell 5 days ago on blood thinners. Combative. EXAM: CT CERVICAL SPINE WITHOUT CONTRAST TECHNIQUE: Multidetector CT imaging of the cervical spine was performed without intravenous contrast. Multiplanar CT image reconstructions were also generated. COMPARISON:  Head CT today reported separately. Cervical spine CT 01/25/2021. FINDINGS: Alignment: Stable cervical lordosis. Cervicothoracic junction alignment is within normal limits. Bilateral posterior element alignment is within normal limits. Skull base and vertebrae: Visualized skull base is intact. No atlanto-occipital dissociation. However, there is an oblique and mildly comminuted fracture through the left lateral C1 ring involving the lateral mass. Minimal displacement. Elsewhere the C1 ring remains intact. There is minimal rotary subluxation of C1 on C2. Atlanto dens interval remains normal. C2 appears stable and intact. Other cervical vertebrae appear stable and intact, including bulky bridging anterior endplate osteophytosis at both C4-C5 and C6-C7. No other acute cervical spine fracture identified. Soft tissues and spinal canal: No prevertebral fluid or swelling. No visible canal hematoma. Bulky calcified carotid atherosclerosis on the right. Otherwise negative noncontrast visible neck soft  tissues. Disc levels: Cervical spine degeneration appears stable since May. Chronic interbody ankylosis at C4-C5 and C6-C7 due to bulky flowing osteophytes. Chronic absent ankylosis at C5-C6. Upper  chest: Mild T1 superior endplate compression is stable. But there is a new fracture through the anterior bridging T1-T2 osteophyte on sagittal image 25. This is nondisplaced. Mild T2 superior endplate compression is not definitely changed. The T1 and T2 posterior elements appear intact. Mild septal thickening in the lung apices. IMPRESSION: 1. Oblique, mildly comminuted fracture of the left lateral C1 ring with minimal displacement and minimal rotary subluxation of C1 on C2. 2. Acute fracture through an anterior bridging T1-T2 osteophyte. No displacement. Mild superior endplate compression at both T1 and T2 appears to be chronic and stable. 3. No other acute traumatic injury identified in the cervical spine. 4. Chronic cervical spine ankylosis at C4-C5 and C6-C7 due to Diffuse idiopathic skeletal hyperostosis (DISH). Critical Value/emergent results were called by telephone at the time of interpretation on 04/21/2021 at 10:06 am to Dr Matilde Sprang, who verbally acknowledged these results. Electronically Signed   By: Genevie Ann M.D.   On: 04/21/2021 10:09    EKG: Independently reviewed.  Chronic A. fib  Assessment/Plan Active Problems:   C1 cervical fracture (HCC)  (please populate well all problems here in Problem List. (For example, if patient is on BP meds at home and you resume or decide to hold them, it is a problem that needs to be her. Same for CAD, COPD, HLD and so on)  Fall and acute subdural hematoma -Hold Xarelto, it CT head tomorrow if hematoma stable, resume Xarelto tomorrow.  Discussed with neurology. -Aggressive control of blood pressure, add as needed hydralazine for BP 130/100, aiming at BP 130/80.  C1 fracture -Appears to be stable, neurology recommended conservative management with neck collar. -As  per neurosurgery recommendation also ordered CT thoracal spine to rule out other fractures. -PT evaluation.  Acute encephalopathy metabolic -Probably from multifactorial effect of uncontrolled pain, pneumonia. -Avoid narcotics -Treat pneumonia with p.o. antibiotics. -Avoid benzos, use as needed haloperidol.  Chronic A. Fib -Controlled, continue beta-blocker -Restart Xarelto tomorrow if CT head showing subdural hematoma stable.  Pneumonia -CAP, no significant hypoxia, continue doxycycline.  IDDM -Sliding scale for now  HTN/HLD -Continue home meds.  DVT prophylaxis: SCD Code Status: DNR Family Communication: Called patient niece over the phone, explained that the C1 fracture does not need surgical intervention as per neurosurgery and repeat CT head tomorrow to make sure intracranial bleed stable, niece expressed understanding and agreed. Disposition Plan: Expect 2 to 3 days hospital stay Consults called: Neurosurgery Admission status: Telemetry admission   Lequita Halt MD [facility] Pager [phone]  04/21/2021, 3:36 PM

## 2021-04-21 NOTE — Progress Notes (Signed)
Orthopedic Tech Progress Note Patient Details:  Dawn Morrow 1936/01/24 [id number]  Patient ID: Dawn Morrow, female   DOB: 1936/02/25, 85 y.o.   MRN: [id number] ASPEN cervical collar stat order called to [facility].    Thanks,  Verdene Lennert, PT, DPT  Acute Rehabilitation Ortho Tech Supervisor [phone] pager #[phone] office    Dawn Morrow 04/21/2021, 10:20 AM

## 2021-04-21 NOTE — Plan of Care (Signed)
Patient alert no c/o pain at this time daughter at the bedside. Will manage pain. Will continue to monitor.

## 2021-04-22 ENCOUNTER — Inpatient Hospital Stay (HOSPITAL_COMMUNITY): Payer: Medicare Other

## 2021-04-22 DIAGNOSIS — S065X9A Traumatic subdural hemorrhage with loss of consciousness of unspecified duration, initial encounter: Secondary | ICD-10-CM | POA: Diagnosis not present

## 2021-04-22 DIAGNOSIS — S12001A Unspecified nondisplaced fracture of first cervical vertebra, initial encounter for closed fracture: Secondary | ICD-10-CM | POA: Diagnosis not present

## 2021-04-22 DIAGNOSIS — J189 Pneumonia, unspecified organism: Secondary | ICD-10-CM | POA: Diagnosis not present

## 2021-04-22 DIAGNOSIS — S065X0A Traumatic subdural hemorrhage without loss of consciousness, initial encounter: Secondary | ICD-10-CM | POA: Diagnosis not present

## 2021-04-22 LAB — CBC
HCT: 43.3 % (ref 36.0–46.0)
Hemoglobin: 13.3 g/dL (ref 12.0–15.0)
MCH: 29.6 pg (ref 26.0–34.0)
MCHC: 30.7 g/dL (ref 30.0–36.0)
MCV: 96.4 fL (ref 80.0–100.0)
Platelets: 170 10*3/uL (ref 150–400)
RBC: 4.49 MIL/uL (ref 3.87–5.11)
RDW: 15.1 % (ref 11.5–15.5)
WBC: 10.4 10*3/uL (ref 4.0–10.5)
nRBC: 0 % (ref 0.0–0.2)

## 2021-04-22 LAB — GLUCOSE, CAPILLARY
Glucose-Capillary: 120 mg/dL — ABNORMAL HIGH (ref 70–99)
Glucose-Capillary: 152 mg/dL — ABNORMAL HIGH (ref 70–99)
Glucose-Capillary: 141 mg/dL — ABNORMAL HIGH (ref 70–99)

## 2021-04-22 MED ORDER — SODIUM CHLORIDE 0.9 % IV BOLUS
1000.0000 mL | Freq: Once | INTRAVENOUS | Status: AC
  Administered 2021-04-22: 1000 mL via INTRAVENOUS

## 2021-04-22 MED ORDER — DOXYCYCLINE HYCLATE 100 MG PO TABS: 100.0000 mg | ORAL_TABLET | Freq: Two times a day (BID) | ORAL | 0 refills | Status: AC

## 2021-04-22 NOTE — Evaluation (Signed)
Physical Therapy Evaluation Patient Details Name: Dawn Morrow MRN: [id number] DOB: 1936-03-02 Today's Date: 04/22/2021   History of Present Illness  Pt is 85 yo female with chronic ambulation dysfuction and frequent falls (latest one 5 days ago) admitted with UTI.  Pt also found to have trace acute subdural hematoma, L C1 ring fx with min displacement and min rotatory subluxation.  Neurosurgery consulted and recommended conservative management with ASPEN collar at all times.  Pt with hx of severe dementia, frequent falls, COPD, IDDM, PAF on Xarelto  Clinical Impression   Pt admitted with above diagnosis. At baseline pt is from nursing home and required assist with ADLs and transfers.  She does not ambulate other than for transfers due to frequent falls.  Pt does mobilize in a w/c.  Today, pt requiring mod A for transfers and was limited due to orthostatic hypotension (see general comments). Pt currently with functional limitations due to the deficits listed below (see PT Problem List). Pt will benefit from skilled PT to increase their independence and safety with mobility to allow discharge to the venue listed below.       Follow Up Recommendations SNF    Equipment Recommendations  None recommended by PT    Recommendations for Other Services       Precautions / Restrictions Precautions Precautions: Cervical;Fall Precaution Booklet Issued: No Precaution Comments: Educated family on cervical precautions; also pt with orthostatic hypotension Required Braces or Orthoses: Cervical Brace Cervical Brace: Hard collar;At all times Restrictions Weight Bearing Restrictions: No      Mobility  Bed Mobility Overal bed mobility: Needs Assistance Bed Mobility: Sidelying to Sit;Sit to Sidelying   Sidelying to sit: Min assist     Sit to sidelying: Mod assist General bed mobility comments: Performed x 2 during session with multimodal cues    Transfers Overall transfer level: Needs  assistance Equipment used: Rolling walker (2 wheeled) Transfers: Sit to/from Stand Sit to Stand: Mod assist         General transfer comment: Performed x 2 during sesssion - first attempt mod A, second with min A of 2 due to suspected orthostatic hypotension  Ambulation/Gait             General Gait Details: unable  Stairs            Wheelchair Mobility    Modified Rankin (Stroke Patients Only)       Balance Overall balance assessment: Needs assistance Sitting-balance support: Bilateral upper extremity supported Sitting balance-Leahy Scale: Poor Sitting balance - Comments: Requiring UE support and min A at times     Standing balance-Leahy Scale: Poor Standing balance comment: required RW and assist                             Pertinent Vitals/Pain Pain Assessment: No/denies pain    Home Living Family/patient expects to be discharged to:: Skilled nursing facility                 Additional Comments: Pt from long term care at [facility]    Prior Function Level of Independence: Needs assistance   Gait / Transfers Assistance Needed: Pt with frequent falls and does not ambulate.  She is normally able to get up to w/c with assist.  She does mobilize in w/c with feet  ADL's / Homemaking Assistance Needed: Has assist with ADLs        Hand Dominance   Dominant Hand: Right  Extremity/Trunk Assessment   Upper Extremity Assessment Upper Extremity Assessment: Difficult to assess due to impaired cognition (ROM grossly WFL, Strength at least 3/5 but unable to further assess)    Lower Extremity Assessment Lower Extremity Assessment: Difficult to assess due to impaired cognition (ROM grossly WFL, Strength at least 3/5 but unable to further assess)    Cervical / Trunk Assessment Cervical / Trunk Assessment: Kyphotic  Communication   Communication: HOH  Cognition Arousal/Alertness: Awake/alert Behavior During Therapy: WFL for tasks  assessed/performed Overall Cognitive Status: History of cognitive impairments - at baseline                                 General Comments: Oriented to self; follows basic commands (more limited on commands by [facility] than confusion at this time)      General Comments   Pt sat EOB with PT and demonstrated increased jerking movements in arms - niece present and reports pt does this some at baseline.  Pt with dementia and unable to communicate symptoms, but could make eye contact with therapist and was talking.  After 3-4 mins at EOB, pt stood with PT but had to return to sitting after only a few seconds. Upon return to sitting pt pitching forward heavily, eyes open, but no longer talking or looking at therapist.  Called and nurse techs into assist back to supine.  In supine, pt again talking to therapist, but not able to communicate any symptoms , BP was 111/81.  RN also in.  After resting, tried sitting up again to get orthostatic with RN present.  BP in sitting was 73/53 - pt not as symptomatic as she was earlier (no jerking, able to talk).  Unable to get BP in standing.  Returned to supine.  RN notifying MD of BP.      Exercises     Assessment/Plan    PT Assessment Patient needs continued PT services  PT Problem List Decreased strength;Decreased mobility;Decreased safety awareness;Decreased range of motion;Decreased knowledge of precautions;Decreased activity tolerance;Decreased cognition;Decreased balance;Decreased knowledge of use of DME       PT Treatment Interventions DME instruction;Therapeutic activities;Gait training;Therapeutic exercise;Patient/family education;Balance training;Functional mobility training;Wheelchair mobility training    PT Goals (Current goals can be found in the Care Plan section)  Acute Rehab PT Goals Patient Stated Goal: go home ([facility]) PT Goal Formulation: With patient/family Time For Goal Achievement: 05/06/21 Potential to Achieve Goals:  Fair    Frequency Min 2X/week   Barriers to discharge        Co-evaluation               AM-PAC PT "6 Clicks" Mobility  Outcome Measure Help needed turning from your back to your side while in a flat bed without using bedrails?: A Little Help needed moving from lying on your back to sitting on the side of a flat bed without using bedrails?: A Lot Help needed moving to and from a bed to a chair (including a wheelchair)?: Total Help needed standing up from a chair using your arms (e.g., wheelchair or bedside chair)?: A Lot Help needed to walk in hospital room?: Total Help needed climbing 3-5 steps with a railing? : Total 6 Click Score: 10    End of Session Equipment Utilized During Treatment: Cervical collar;Gait belt Activity Tolerance: Patient tolerated treatment well Patient left: in bed;with call bell/phone within reach;with bed alarm set;with family/visitor present Nurse Communication: Mobility status;Other (  comment) (RN present for orthostatic BP and reports will notify MD) PT Visit Diagnosis: Unsteadiness on feet (R26.81);Repeated falls (R29.6);Muscle weakness (generalized) (M62.81)    Time: [id number] PT Time Calculation (min) (ACUTE ONLY): 25 min   Charges:   PT Evaluation $PT Eval Moderate Complexity: 1 Mod PT Treatments $Therapeutic Activity: 8-22 mins        Abran Richard, PT Acute Rehab Services Pager [phone] [facility] [phone]  Karlton Lemon 04/22/2021, 12:18 PM

## 2021-04-22 NOTE — TOC Transition Note (Signed)
Transition of Care [facility]) - CM/SW Discharge Note   Patient Details  Name: Dawn Morrow MRN: [id number] Date of Birth: 05-05-1936  Transition of Care [facility]) CM/SW Contact:  Trish Mage, LCSW Phone Number: 04/22/2021, 12:40 PM   Clinical Narrative:  Patient who is stable for discharge will return to [facility] today.  Family alerted.  PTAR arranged. Code 40 given family.  Nursing, please call report to [phone], room 706A. TOC sign off.    Final next level of care: Skilled Nursing Facility Barriers to Discharge: No Barriers Identified   Patient Goals and CMS Choice        Discharge Placement                       Discharge Plan and Services                                     Social Determinants of Health (SDOH) Interventions     Readmission Risk Interventions Readmission Risk Prevention Plan 04/22/2021 10/03/2019  Transportation Screening Complete Complete  PCP or Specialist Appt within 3-5 Days - Complete  HRI or [place] - Not Complete  HRI or Home Care Consult comments - Patient at her baseline  Social Work Consult for [place] Planning/Counseling - [place] - Not Applicable  Medication Review [employer] photographer) Complete Referral to Pharmacy  PCP or Specialist appointment within 3-5 days of discharge Complete -  [place] or Home Care Consult Complete -  SW Recovery Care/Counseling Consult Complete -  Palliative Care Screening Not Applicable -  Skilled Nursing Facility Complete -  Some recent data might be hidden

## 2021-04-22 NOTE — Discharge Summary (Signed)
Physician Discharge Summary  [name] [id number] DOB: 1935/12/03 DOA: 04/21/2021  PCP: Wenda Low, MD  Admit date: 04/21/2021 Discharge date: 04/22/2021  Admitted From: SNF Disposition: SNF  Recommendations for Outpatient Follow-up:  Follow up with PCP in 1-2 weeks Follow-up with neurosurgery, Dr. Saintclair Halsted in 2 weeks for repeat x-rays Need to maintain Aspen cervical collar in place for 3 months Discontinued Xarelto until follows up with neurosurgery Doxycycline 100 mg p.o. twice daily x7 days for possible community-acquired pneumonia Given her recurrent falls on anticoagulation; may need to discuss further with family regarding goals of care as this may be extremely detrimental in the future with her recurrent falls.  Discharge Condition: Stable CODE STATUS: DNR Diet recommendation: Heart healthy/consistent carbohydrate diet  History of present illness:  Dawn Morrow is a 85 year old female with past medical history significant for dementia, chronic respiratory failure/COPD on home oxygen, type 2 diabetes mellitus, depression/anxiety, hypothyroidism, HTN, HLD, neuropathy, paroxysmal atrial fibrillation on anticoagulation with Xarelto, frequent falls who presents from her nursing home facility for apparent increased confusion.  Patient with chronic ambulation dysfunction with frequent falls at baseline, reported other fall 5 days ago.  Patient was reported very confused and combative and nursing facility concerned that she had a urinary tract infection and started her on doxycycline and sent her to the ED for further evaluation.  In the ED, temperature 97.6 F, HR 79, RR 12, BP 112/93, SPO2 100% on 2 L nasal cannula.  Sodium 140, potassium 4.0, chloride 97, CO2 32, glucose 161, BUN 28, creatinine 1.12, ammonia 25, AST 23, ALT 20, total bilirubin 1.0.  WBC count 12.2, hemoglobin 13.1, platelets 192.  Urinalysis with negative leukocytes, negative nitrite, rare bacteria, 0-5 WBCs.  Chest  x-ray with questionable retrocardiac opacity could represent atelectasis versus infection/inflammation.  CT head/C-spine with partially visible left C1 ring fracture, trace acute subdural hematoma along the floor of the left middle cranial fossa without mass-effect, right vertex scalp hematoma without skull fracture and no other acute intracranial abnormality.  Case was discussed with neurosurgery on-call, recommended conservative management and placement in a Aspen c-collar and repeat CT head in 24 hours.  EDP consulted [facility] for further evaluation management of C1 fracture, subdural hematoma, and questionable commune acquired pneumonia.   Hospital course:  C1 cervical fracture Patient presenting from SNF with recurrent falls, last 5 days prior to ED presentation.  CT C-spine with left C1 ring fracture.  Evaluated by neurosurgery with recommendations of placement of Aspen collar; which will need to remain in place x3 months.  Patient will need follow-up with neurosurgery in 2 weeks for repeat x-rays.  Subdural hematoma: Resolved on imaging Patient presenting from SNF with confusion after multiple falls, last fall reported 5 days prior to ED presentation.  CT head without contrast with acute subdural hematoma along the floor the left middle cranial fossa without mass-effect.  Case was discussed with neurosurgery who recommended conservative measures, discontinuation of her Xarelto and repeat CT head in 24 hours.  Repeat CT head shows subdural hematoma no longer seen likely secondary to redistribution or from artifact and no new hemorrhage.  Discussed once again with neurosurgery and recommended to hold Xarelto until follows up in clinic in 2 weeks.  Community acquired pneumonia, questionable Chest x-ray with questionable retrocardiac opacity could represent atelectasis versus infection versus inflammation.  WBC count slightly elevated on admission of 12.2, 10.4 at time of discharge.  Urinalysis  unrevealing.  Continue doxycycline 100 mg p.o. twice daily to complete  7-day course.  Recommend repeat chest x-ray in 4 weeks to ensure resolution.  Paroxysmal atrial fibrillation New rate control with carvedilol 12.5 mg p.o. twice daily.  Discontinue Xarelto as above for subdural hematoma until follows up with neurosurgery.  Essential hypertension Continue carvedilol 12.5 mg p.o. twice daily.  Type 2 diabetes mellitus Hemoglobin A1c 6.4, well controlled.  Continue insulin glargine 5 units subcutaneously nightly.  Hypothyroidism Continue levothyroxine 88 mcg p.o. daily  Depression/anxiety: Dementia: Continue bupropion 300 mg p.o. daily and Zoloft 100 g p.o. daily.  Dementia/fall precautions.  Chronic respiratory failure/COPD Continue home oxygen.  Not on chronic inhalers.  HLD: Atorvastatin 10 mg p.o. daily and ezetimibe 10 mg p.o. daily  Neuropathy: Gabapentin 300 mg p.o. every morning, 300 mg at noon, 600 mg at bedtime.  Discharge Diagnoses:  Active Problems:   C1 cervical fracture [facility])    Discharge Instructions  Discharge Instructions     Call MD for:  difficulty breathing, headache or visual disturbances   Complete by: As directed    Call MD for:  extreme fatigue   Complete by: As directed    Call MD for:  persistant dizziness or light-headedness   Complete by: As directed    Call MD for:  persistant nausea and vomiting   Complete by: As directed    Call MD for:  severe uncontrolled pain   Complete by: As directed    Call MD for:  temperature >100.4   Complete by: As directed    Diet - low sodium heart healthy   Complete by: As directed    Increase activity slowly   Complete by: As directed       Allergies as of 04/22/2021       Reactions   Penicillins Anaphylaxis   Penicillins Anaphylaxis   Per duplicate chart   Codeine Other (See Comments)   Listed on MAR   Codeine Other (See Comments)   Unknown reaction   Sulfa Antibiotics Other (See Comments)    Unknown reaction   Sulfonylureas Other (See Comments)   Listed on MAR   Sulfonylureas Other (See Comments)   Unknown reaction   Sulfonamide Derivatives Other (See Comments)   Unknown reaction        Medication List     STOP taking these medications    doxycycline 100 MG tablet Commonly known as: ADOXA   rivaroxaban 20 MG Tabs tablet Commonly known as: XARELTO       TAKE these medications    acetaminophen 500 MG tablet Commonly known as: TYLENOL Take 500 mg by mouth every 4 (four) hours as needed for mild pain.   albuterol 108 (90 Base) MCG/ACT inhaler Commonly known as: VENTOLIN HFA Inhale 2 puffs into the lungs every 6 (six) hours as needed for wheezing or shortness of breath.   atorvastatin 10 MG tablet Commonly known as: LIPITOR Take 1 tablet (10 mg total) by mouth at bedtime.   Basaglar KwikPen 100 UNIT/ML Inject 5 Units into the skin at bedtime.   buPROPion 300 MG 24 hr tablet Commonly known as: WELLBUTRIN XL Take 1 tablet (300 mg total) by mouth daily.   carvedilol 12.5 MG tablet Commonly known as: COREG Take 1 tablet (12.5 mg total) by mouth 2 (two) times daily with a meal.   docusate sodium 100 MG capsule Commonly known as: COLACE Take 100 mg by mouth 2 (two) times daily.   doxycycline 100 MG tablet Commonly known as: VIBRA-TABS Take 1 tablet (100 mg total) by mouth  every 12 (twelve) hours for 7 days.   ezetimibe 10 MG tablet Commonly known as: ZETIA Take 10 mg by mouth daily.   gabapentin 300 MG capsule Commonly known as: NEURONTIN Take 1 cap in AM, 1 cap at noon, 2 caps at bedtime What changed:  how much to take how to take this when to take this additional instructions   guaifenesin 100 MG/5ML syrup Commonly known as: ROBITUSSIN Take 200 mg by mouth every 4 (four) hours as needed for cough.   ketoconazole 2 % shampoo Commonly known as: NIZORAL Apply 1 application topically See admin instructions. Use topically to shampoo hair  twice weekly - Tuesday and Friday   levothyroxine 88 MCG tablet Commonly known as: SYNTHROID Take 88 mcg by mouth daily before breakfast.   meclizine 25 MG tablet Commonly known as: ANTIVERT Take 25 mg by mouth at bedtime as needed for dizziness.   multivitamin with minerals Tabs tablet Take 1 tablet by mouth daily.   nystatin powder Commonly known as: MYCOSTATIN/NYSTOP Apply 1 application topically 2 (two) times daily.   ondansetron 4 MG tablet Commonly known as: ZOFRAN Take 4 mg by mouth every 8 (eight) hours as needed for nausea or vomiting.   OXYGEN Inhale 3 L into the lungs continuous.   polyethylene glycol 17 g packet Commonly known as: MIRALAX / GLYCOLAX Take 17 g by mouth daily as needed for mild constipation or moderate constipation.   Propylene Glycol 0.6 % Soln Place 1 drop into both eyes 4 (four) times daily as needed (for dry eyes).   sertraline 100 MG tablet Commonly known as: ZOLOFT Take 1 tablet (100 mg total) by mouth daily.   Vitamin D 50 MCG (2000 UT) tablet Take 2,000 Units by mouth daily.        Follow-up Information     Kary Kos, MD. Schedule an appointment as soon as possible for a visit in 2 week(s).   Specialty: Neurosurgery Contact information: [address] [place] [address] [phone]         Wenda Low, MD. Schedule an appointment as soon as possible for a visit in 1 week(s).   Specialty: Internal Medicine Contact information: [address] [place] [address] [phone]                Allergies  Allergen Reactions   Penicillins Anaphylaxis   Penicillins Anaphylaxis    Per duplicate chart   Codeine Other (See Comments)    Listed on MAR   Codeine Other (See Comments)    Unknown reaction   Sulfa Antibiotics Other (See Comments)    Unknown reaction   Sulfonylureas Other (See Comments)    Listed on MAR   Sulfonylureas Other (See Comments)    Unknown reaction    Sulfonamide Derivatives Other (See Comments)    Unknown reaction    Consultations: Neurosurgery, Dr. Saintclair Halsted   Procedures/Studies: DG Chest 1 View  Result Date: 04/21/2021 CLINICAL DATA:  Altered mental status since this morning. Recent falls 5 days ago. Bruising to right side of head. Patient takes blood thinners. Upon EMS arrival patient combative, haldol and versed given. Possible UTI, possible sepsis. EXAM: CHEST  1 VIEW.  Patient is rotated. COMPARISON:  Chest x-ray 01/25/2021, CT chest 07/12/2004 FINDINGS: The heart size and mediastinal contours are unchanged. Aortic calcifications. Question retrocardiac opacity. No pulmonary edema. No pleural effusion. No pneumothorax. No acute osseous abnormality. Multilevel degenerative changes of the spine. IMPRESSION: Question retrocardiac opacity-finding could  represent atelectasis versus infection/inflammation. Differential diagnosis includes artifact in the setting of patient rotation. Recommend repeat PA and lateral view of the chest. Electronically Signed   By: Iven Finn M.D.   On: 04/21/2021 16:26   CT HEAD WO CONTRAST (5MM)  Result Date: 04/22/2021 CLINICAL DATA:  Subdural hematoma EXAM: CT HEAD WITHOUT CONTRAST TECHNIQUE: Contiguous axial images were obtained from the base of the skull through the vertex without intravenous contrast. COMPARISON:  04/21/2021 FINDINGS: Brain: Previously identified trace subdural hematoma along the floor of the left middle cranial fossa is not seen. No new hemorrhage. Gray-white differentiation is preserved. Ventricles are stable in size. Stable findings of chronic microvascular ischemic changes and chronic small vessel infarcts. Vascular: There is atherosclerotic calcification at the skull base. Left MCA bifurcation aneurysm. Skull: No new finding. Sinuses/Orbits: No acute finding. Other: None. IMPRESSION: Trace subdural hematoma identified previously is no longer seen and may have redistributed or was artifactual.  No new hemorrhage. Electronically Signed   By: Macy Mis M.D.   On: 04/22/2021 08:48   CT HEAD WO CONTRAST  Addendum Date: 04/21/2021   ADDENDUM REPORT: 04/21/2021 10:09 ADDENDUM: Critical Value/emergent results were called by telephone at the time of interpretation on 04/21/2021 at 10:06 am to Dr Matilde Sprang, who verbally acknowledged these results. Electronically Signed   By: Genevie Ann M.D.   On: 04/21/2021 10:09   Result Date: 04/21/2021 CLINICAL DATA:  85 year old female with altered mental status this morning. Fell 5 days ago on blood thinners. Combative. EXAM: CT HEAD WITHOUT CONTRAST TECHNIQUE: Contiguous axial images were obtained from the base of the skull through the vertex without intravenous contrast. COMPARISON:  Brain MRI 12/16/2019.  Head CT 01/25/2021. FINDINGS: Brain: Stable cerebral volume. No midline shift, ventriculomegaly, evidence of mass lesion, or evidence of cortically based acute infarction. Confluent bilateral cerebral white matter hypodensity is associated with bilateral basal ganglia and left thalamic heterogeneity and appears stable since May. No cortical encephalomalacia identified. But on coronal image 39 there is evidence of a trace hyperdense subdural hematoma along the floor of the left middle cranial fossa. This is 1-2 mm. No associated mass effect. No other intracranial hemorrhage identified. Vascular: Calcified atherosclerosis at the skull base. There is a small round aneurysm at the left MCA bifurcation estimated at 6 mm on series 2, image 13, and not significantly changed from a 2015 intracranial MRA (5-6 mm at that time). Skull: Stable, intact. However, there is a mildly comminuted left C1 ring fracture which is minimally displaced on series 3, image 2. Sinuses/Orbits: Visualized paranasal sinuses and mastoids are clear. Other: Mild anterior vertex scalp hematoma or contusion on series 3, image 49. Underlying calvarium intact. Other orbit and scalp soft tissues appears  stable. IMPRESSION: 1. Partially visible left C1 ring fracture. See Cervical Spine reported separately. 2. Trace acute Subdural Hematoma suspected along the floor of the Left middle cranial fossa. No associated mass effect. 3. Mild right vertex scalp hematoma.  No skull fracture identified. 4. No other acute intracranial abnormality. There is a chronic unruptured Left MCA bifurcation Aneurysm, 6 mm. And advanced chronic small vessel disease. Electronically Signed: By: Genevie Ann M.D. On: 04/21/2021 10:00   CT CERVICAL SPINE WO CONTRAST  Result Date: 04/21/2021 CLINICAL DATA:  85 year old female with altered mental status this morning. Fell 5 days ago on blood thinners. Combative. EXAM: CT CERVICAL SPINE WITHOUT CONTRAST TECHNIQUE: Multidetector CT imaging of the cervical spine was performed without intravenous contrast. Multiplanar CT image reconstructions were also  generated. COMPARISON:  Head CT today reported separately. Cervical spine CT 01/25/2021. FINDINGS: Alignment: Stable cervical lordosis. Cervicothoracic junction alignment is within normal limits. Bilateral posterior element alignment is within normal limits. Skull base and vertebrae: Visualized skull base is intact. No atlanto-occipital dissociation. However, there is an oblique and mildly comminuted fracture through the left lateral C1 ring involving the lateral mass. Minimal displacement. Elsewhere the C1 ring remains intact. There is minimal rotary subluxation of C1 on C2. Atlanto dens interval remains normal. C2 appears stable and intact. Other cervical vertebrae appear stable and intact, including bulky bridging anterior endplate osteophytosis at both C4-C5 and C6-C7. No other acute cervical spine fracture identified. Soft tissues and spinal canal: No prevertebral fluid or swelling. No visible canal hematoma. Bulky calcified carotid atherosclerosis on the right. Otherwise negative noncontrast visible neck soft tissues. Disc levels: Cervical spine  degeneration appears stable since May. Chronic interbody ankylosis at C4-C5 and C6-C7 due to bulky flowing osteophytes. Chronic absent ankylosis at C5-C6. Upper chest: Mild T1 superior endplate compression is stable. But there is a new fracture through the anterior bridging T1-T2 osteophyte on sagittal image 25. This is nondisplaced. Mild T2 superior endplate compression is not definitely changed. The T1 and T2 posterior elements appear intact. Mild septal thickening in the lung apices. IMPRESSION: 1. Oblique, mildly comminuted fracture of the left lateral C1 ring with minimal displacement and minimal rotary subluxation of C1 on C2. 2. Acute fracture through an anterior bridging T1-T2 osteophyte. No displacement. Mild superior endplate compression at both T1 and T2 appears to be chronic and stable. 3. No other acute traumatic injury identified in the cervical spine. 4. Chronic cervical spine ankylosis at C4-C5 and C6-C7 due to Diffuse idiopathic skeletal hyperostosis (DISH). Critical Value/emergent results were called by telephone at the time of interpretation on 04/21/2021 at 10:06 am to Dr Matilde Sprang, who verbally acknowledged these results. Electronically Signed   By: Genevie Ann M.D.   On: 04/21/2021 10:09   CT THORACIC SPINE WO CONTRAST  Result Date: 04/21/2021 CLINICAL DATA:  Compression fracture, thoracic spine; EXAM: CT THORACIC SPINE WITHOUT CONTRAST TECHNIQUE: Multidetector CT images of the thoracic were obtained using the standard protocol without intravenous contrast. COMPARISON:  Correlation made with cervical spine CT from earlier same day as well as 08/12/2020 FINDINGS: Alignment: No significant anteroposterior listhesis. Vertebrae: Decreased osseous mineralization. There are changes of diffuse idiopathic skeletal hyperostosis. As stated on the cervical spine CT, there is a new fracture through the anterior bridging osteophyte on the right at T1-T2 without displacement. No widening of the disc space. No  additional thoracic spine fracture identified. Paraspinal and other soft tissues: Unremarkable burden Disc levels: Multilevel degenerative changes are present without high-grade osseous encroachment on the spinal canal. IMPRESSION: Nondisplaced fracture through the right anterior bridging osteophyte at T1-T2. This is age-indeterminate but new from 08/12/2020. no other potentially acute osseous spine fracture identified. Electronically Signed   By: Macy Mis M.D.   On: 04/21/2021 17:30     Subjective: Patient seen examined at bedside, resting comfortably.  No complaints this morning.  Pain controlled.  Appears to be at her normal baseline.  Pleasantly confused.  No family present at bedside.  Discussed case with neurosurgery this morning regarding repeat CT head, given resolution of subdural hematoma okay for discharge back to SNF.  Recommended holding Xarelto until follows up with neurosurgery in 2 weeks with repeat x-rays.  Aspen collar to remain in place for 3 months.  Patient with no other complaints  or concerns at this time.  Denies headache, no visual changes, no chest pain, no shortness of breath, no abdominal pain.  No acute events overnight per nursing staff.  Discharge Exam: Vitals:   04/22/21 0517 04/22/21 0849  BP: 114/68 (!) 149/82  Pulse: 77 78  Resp: 18   Temp: 97.7 F (36.5 C)   SpO2: 100%    Vitals:   04/21/21 2158 04/22/21 0210 04/22/21 0517 04/22/21 0849  BP: 110/80 (!) 146/87 114/68 (!) 149/82  Pulse: 80 90 77 78  Resp: '18 18 18   '$ Temp: 98.5 F (36.9 C) 97.8 F (36.6 C) 97.7 F (36.5 C)   TempSrc: Oral Axillary Axillary   SpO2:  95% 100%   Weight: 78.8 kg     Height: 5' 4.96" (1.65 m)       General: Pt is alert, awake, not in acute distress Cardiovascular: RRR, S1/S2 +, no rubs, no gallops Respiratory: CTA bilaterally, no wheezing, no rhonchi Abdominal: Soft, NT, ND, bowel sounds + Extremities: no edema, no cyanosis    The results of significant  diagnostics from this hospitalization (including imaging, microbiology, ancillary and laboratory) are listed below for reference.     Microbiology: Recent Results (from the past 240 hour(s))  SARS CORONAVIRUS 2 (TAT 6-24 HRS) Nasopharyngeal Nasopharyngeal Swab     Status: None   Collection Time: 04/21/21 12:10 PM   Specimen: Nasopharyngeal Swab  Result Value Ref Range Status   SARS Coronavirus 2 NEGATIVE NEGATIVE Final    Comment: (NOTE) SARS-CoV-2 target nucleic acids are NOT DETECTED.  The SARS-CoV-2 RNA is generally detectable in upper and lower respiratory specimens during the acute phase of infection. Negative results do not preclude SARS-CoV-2 infection, do not rule out co-infections with other pathogens, and should not be used as the sole basis for treatment or other patient management decisions. Negative results must be combined with clinical observations, patient history, and epidemiological information. The expected result is Negative.  Fact Sheet for Patients: SugarRoll.be  Fact Sheet for Healthcare Providers: https://www.woods-mathews.com/  This test is not yet approved or cleared by the [place] FDA and  has been authorized for detection and/or diagnosis of SARS-CoV-2 by FDA under an Emergency Use Authorization (EUA). This EUA will remain  in effect (meaning this test can be used) for the duration of the COVID-19 declaration under Se ction 564(b)(1) of the Act, 21 U.S.C. section 360bbb-3(b)(1), unless the authorization is terminated or revoked sooner.  Performed at [facility], [place] [address] [place], [place] [address]      Labs: BNP (last 3 results) Recent Labs    07/31/20 0359 08/01/20 0149 08/02/20 0229  BNP 307.6* 221.7* 123456*   Basic Metabolic Panel: Recent Labs  Lab 04/21/21 0854  NA 140  K 4.0  CL 97*  CO2 32  GLUCOSE 161*  BUN 28*  CREATININE 1.12*  CALCIUM 9.4   Liver Function  Tests: Recent Labs  Lab 04/21/21 0854  AST 23  ALT 20  ALKPHOS 79  BILITOT 1.0  PROT 7.4  ALBUMIN 3.7   No results for input(s): LIPASE, AMYLASE in the last 168 hours. Recent Labs  Lab 04/21/21 0845  AMMONIA 25   CBC: Recent Labs  Lab 04/21/21 0854 04/22/21 0427  WBC 12.2* 10.4  NEUTROABS 9.5*  --   HGB 13.1 13.3  HCT 42.0 43.3  MCV 95.5 96.4  PLT 192 170   Cardiac Enzymes: No results for input(s): CKTOTAL, CKMB, CKMBINDEX, TROPONINI in the last 168  hours. BNP: Invalid input(s): POCBNP CBG: Recent Labs  Lab 04/21/21 0910 04/21/21 1608 04/21/21 2037 04/22/21 0739  GLUCAP 140* 115* 127* 120*   D-Dimer No results for input(s): DDIMER in the last 72 hours. Hgb A1c Recent Labs    04/21/21 0854  HGBA1C 6.4*   Lipid Profile No results for input(s): CHOL, HDL, LDLCALC, TRIG, CHOLHDL, LDLDIRECT in the last 72 hours. Thyroid function studies No results for input(s): TSH, T4TOTAL, T3FREE, THYROIDAB in the last 72 hours.  Invalid input(s): FREET3 Anemia work up No results for input(s): VITAMINB12, FOLATE, FERRITIN, TIBC, IRON, RETICCTPCT in the last 72 hours. Urinalysis    Component Value Date/Time   COLORURINE YELLOW 04/21/2021 1644   APPEARANCEUR HAZY (A) 04/21/2021 1644   LABSPEC 1.018 04/21/2021 1644   PHURINE 5.0 04/21/2021 1644   GLUCOSEU NEGATIVE 04/21/2021 1644   GLUCOSEU NEGATIVE 06/12/2014 1410   HGBUR LARGE (A) 04/21/2021 1644   BILIRUBINUR NEGATIVE 04/21/2021 1644   BILIRUBINUR Neg 06/19/2016 1101   KETONESUR 5 (A) 04/21/2021 1644   PROTEINUR 100 (A) 04/21/2021 1644   UROBILINOGEN >=8.0 06/19/2016 1101   UROBILINOGEN 0.2 05/31/2015 1835   NITRITE NEGATIVE 04/21/2021 1644   LEUKOCYTESUR NEGATIVE 04/21/2021 1644   Sepsis Labs Invalid input(s): PROCALCITONIN,  WBC,  LACTICIDVEN Microbiology Recent Results (from the past 240 hour(s))  SARS CORONAVIRUS 2 (TAT 6-24 HRS) Nasopharyngeal Nasopharyngeal Swab     Status: None   Collection Time:  04/21/21 12:10 PM   Specimen: Nasopharyngeal Swab  Result Value Ref Range Status   SARS Coronavirus 2 NEGATIVE NEGATIVE Final    Comment: (NOTE) SARS-CoV-2 target nucleic acids are NOT DETECTED.  The SARS-CoV-2 RNA is generally detectable in upper and lower respiratory specimens during the acute phase of infection. Negative results do not preclude SARS-CoV-2 infection, do not rule out co-infections with other pathogens, and should not be used as the sole basis for treatment or other patient management decisions. Negative results must be combined with clinical observations, patient history, and epidemiological information. The expected result is Negative.  Fact Sheet for Patients: SugarRoll.be  Fact Sheet for Healthcare Providers: https://www.woods-mathews.com/  This test is not yet approved or cleared by the [place] FDA and  has been authorized for detection and/or diagnosis of SARS-CoV-2 by FDA under an Emergency Use Authorization (EUA). This EUA will remain  in effect (meaning this test can be used) for the duration of the COVID-19 declaration under Se ction 564(b)(1) of the Act, 21 U.S.C. section 360bbb-3(b)(1), unless the authorization is terminated or revoked sooner.  Performed at [facility], [place] [address] [place], [place] [address]      Time coordinating discharge: Over 30 minutes  SIGNED:   Steven Basso J [place] DO  [facility] 04/22/2021, 10:41 AM

## 2021-04-22 NOTE — Care Management Obs Status (Signed)
[place] NOTIFICATION   Patient Details  Name: Dawn Morrow MRN: [id number] Date of Birth: [birth year]   Medicare Observation Status Notification Given:  Yes    Trish Mage, LCSW 04/22/2021, 12:30 PM

## 2021-04-22 NOTE — Progress Notes (Addendum)
2nd attempt to call report. No answer. Left number on patient discharge packet to call me for report. Went over discharge instructions w/ pt niece [name].

## 2021-04-22 NOTE — Progress Notes (Signed)
Attempted to call report. Was unable to speak w/ a nurse. Will try again later.

## 2021-04-22 NOTE — Care Management CC44 (Signed)
Condition Code 44 Documentation Completed  Patient Details  Name: Dawn Morrow MRN: [id number] Date of Birth: 02-10-1936   Condition Code 44 given:  Yes Patient signature on Condition Code 44 notice:  Yes Ferdinand Cava "[place]    [phone] Financial responsiblity   Information was shared by phone, and he gave CSW permission to sign document for him and give to his sister, Ms Wayne Both, who was bedside) Documentation of 2 MD's agreement:  Yes Code 44 added to claim:  Yes    Trish Mage, LCSW 04/22/2021, 12:29 PM

## 2021-04-22 NOTE — NC FL2 (Signed)
[place] LEVEL OF CARE SCREENING TOOL     IDENTIFICATION  Patient Name: Dawn Morrow Birthdate: 06-08-1936 Sex: female Admission Date (Current Location): 04/21/2021  [facility] [place] Number:  Herbalist and Address:  [name],  [place], [place]      Provider Number: [id number]  Attending Physician Name and Address:  [place] Eric J, DO  Relative Name and Phone Number:  Merrilee Jansky (Niece)   [phone]    Current Level of Care: Hospital Recommended Level of Care: [place] Prior Approval Number:    Date Approved/Denied:   PASRR Number:    Discharge Plan: SNF ([facility])    Current Diagnoses: Patient Active Problem List   Diagnosis Date Noted   C1 cervical fracture ([place]) 04/21/2021   SDH (subdural hematoma) ([place])    UTI (urinary tract infection) 07/31/2020   CAP (community acquired pneumonia) 07/31/2020   Pressure injury of skin 07/31/2020   Malnutrition of moderate degree 07/19/2020   Adjustment disorder with depressed mood 07/13/2020   Dehydration 07/12/2020   Fall [id number]   Acute metabolic encephalopathy [id number]   AMS (altered mental status) 03/26/2020   History of pulmonary embolism 03/26/2020   History of DVT (deep vein thrombosis) 03/26/2020   Atrial fibrillation, chronic ([place]) 03/26/2020   Sepsis ([place]) 12/14/2019   Acute encephalopathy 12/14/2019   COVID-19 virus infection 10/02/2019   Acute lower UTI 10/02/2019   AKI (acute kidney injury) ([place]) 10/01/2019   Syncope 09/18/2017   Near syncope 09/17/2017   Diabetes mellitus type 2 in obese ([place]) 09/17/2017   Atrial fibrillation with RVR ([place]) 09/17/2017   Hypomagnesemia 09/17/2017   CKD (chronic kidney disease), stage III ([place]) 09/17/2017   Forehead contusion    MGUS (monoclonal gammopathy of unknown significance) 08/13/2016   Renal failure 07/09/2015   Follow-up ---------PCP NOTES 05/17/2015   Essential tremor  12/28/2014   Ulnar neuropathy of left upper extremity 12/28/2014   Intractable pain 06/21/2014   Back pain 06/21/2014   Lumbar radiculopathy 06/21/2014   Encounter for therapeutic drug monitoring 03/14/2014   Numbness 10/18/2013   TIA (transient ischemic attack) 09/19/2013   Weakness 09/18/2013   Annual physical exam 05/30/2012   Tremor 01/21/2012   Failure to thrive and poor med compliance  01/21/2012   Pulmonary embolism ([place]) 11/18/2010   DVT (deep venous thrombosis) ([place]) 11/18/2010   Long term current use of anticoagulant 11/18/2010   ABNORMAL ELECTROCARDIOGRAM 11/10/2010   OSTEOARTHRITIS 08/06/2010   DIZZINESS 04/15/2010   Diabetes ([place]) 06/04/2009   ACNE ROSACEA 11/28/2008   BACK PAIN 05/16/2008   HIP PAIN, RIGHT, CHRONIC 09/15/2007   Hypothyroidism 09/13/2007   Dyslipidemia 09/13/2007   Osteoporosis 07/22/2007   DEPRESSION 09/11/2006   HTN (hypertension) 09/11/2006   ASTHMA 09/11/2006   COPD (chronic obstructive pulmonary disease) ([place]) 09/11/2006   GERD 09/11/2006   Recurrent UTI 09/11/2006   [place] FILTER INSERTION, HX OF 09/11/2006    Orientation RESPIRATION BLADDER Height & Weight     Self  O2 (2 [place]) Incontinent Weight: 78.8 kg Height:  5' 4.96" (165 cm)  BEHAVIORAL SYMPTOMS/MOOD NEUROLOGICAL BOWEL NUTRITION STATUS      Incontinent Diet (see d/c summary)  AMBULATORY STATUS COMMUNICATION OF NEEDS Skin   Extensive Assist Verbally Normal                       Personal Care Assistance Level of Assistance  Bathing, Feeding, Dressing Bathing Assistance: Maximum assistance Feeding assistance:  Limited assistance Dressing Assistance: Maximum assistance     Functional Limitations Info  Sight, Hearing, Speech Sight Info: Adequate Hearing Info: Adequate Speech Info: Adequate    SPECIAL CARE FACTORS FREQUENCY                       Contractures Contractures Info: Not present    Additional Factors Info  Code Status, Allergies Code Status  Info: DNR Allergies Info: Penicillins, Codeine, Sulfonylureas, Sulfonamide Derivatives           Current Medications (04/22/2021):  This is the current hospital active medication list Current Facility-Administered Medications  Medication Dose Route Frequency Provider Last Rate Last Admin   acetaminophen (TYLENOL) tablet 650 mg  650 mg Oral Q6H PRN Wynetta Fines T, MD   650 mg at 04/21/21 1527   Or   acetaminophen (TYLENOL) suppository 650 mg  650 mg Rectal Q6H PRN Wynetta Fines T, MD       albuterol (PROVENTIL) (2.5 MG/3ML) 0.083% nebulizer solution 2.5 mg  2.5 mg Inhalation Q6H PRN Wynetta Fines T, MD       atorvastatin (LIPITOR) tablet 10 mg  10 mg Oral QHS Wynetta Fines T, MD   10 mg at 04/21/21 2138   benzonatate (TESSALON) capsule 100 mg  100 mg Oral BID Wynetta Fines T, MD   100 mg at 04/22/21 1055   buPROPion (WELLBUTRIN XL) 24 hr tablet 300 mg  300 mg Oral Daily Wynetta Fines T, MD   300 mg at 04/22/21 1056   carvedilol (COREG) tablet 12.5 mg  12.5 mg Oral BID WC Wynetta Fines T, MD   12.5 mg at 04/22/21 0851   cholecalciferol (VITAMIN D3) tablet 2,000 Units  2,000 Units Oral Daily Wynetta Fines T, MD   2,000 Units at 04/22/21 1056   docusate sodium (COLACE) capsule 100 mg  100 mg Oral BID Wynetta Fines T, MD   100 mg at 04/22/21 1057   doxycycline (VIBRA-TABS) tablet 100 mg  100 mg Oral Q12H Wynetta Fines T, MD   100 mg at 04/22/21 1055   ezetimibe (ZETIA) tablet 10 mg  10 mg Oral Daily Wynetta Fines T, MD   10 mg at 04/22/21 1057   gabapentin (NEURONTIN) capsule 300 mg  300 mg Oral BID Wynetta Fines T, MD   300 mg at 04/22/21 [id number]   gabapentin (NEURONTIN) capsule 600 mg  600 mg Oral QHS Wynetta Fines T, MD   600 mg at 04/21/21 2137   guaiFENesin (ROBITUSSIN) 100 MG/5ML solution 200 mg  200 mg Oral Q4H PRN Wynetta Fines T, MD       haloperidol lactate (HALDOL) injection 2 mg  2 mg Intramuscular Q6H PRN Wynetta Fines T, MD   2 mg at 04/21/21 1530   hydrALAZINE (APRESOLINE) injection 5 mg  5 mg Intravenous Q6H PRN  Wynetta Fines T, MD       insulin aspart (novoLOG) injection 0-9 Units  0-9 Units Subcutaneous TID WC Wynetta Fines T, MD       ketoconazole (NIZORAL) 2 % shampoo 1 application  1 application Topical Once per day on Tue Fri Zhang, Ping T, MD       levothyroxine (SYNTHROID) tablet 88 mcg  88 mcg Oral QAC breakfast Wynetta Fines T, MD   88 mcg at 04/22/21 [id number]   meclizine (ANTIVERT) tablet 25 mg  25 mg Oral QHS PRN Lequita Halt, MD       multivitamin with minerals tablet 1 tablet  1  tablet Oral Daily Wynetta Fines T, MD   1 tablet at 04/22/21 1055   nystatin (MYCOSTATIN/NYSTOP) topical powder 1 application  1 application Topical BID Wynetta Fines T, MD   1 application at [id number] 1058   ondansetron (ZOFRAN) tablet 4 mg  4 mg Oral Q8H PRN Wynetta Fines T, MD       polyethylene glycol (MIRALAX / GLYCOLAX) packet 17 g  17 g Oral Daily PRN Wynetta Fines T, MD       polyvinyl alcohol (LIQUIFILM TEARS) 1.4 % ophthalmic solution 1 drop  1 drop Both Eyes PRN Wynetta Fines T, MD       sertraline (ZOLOFT) tablet 100 mg  100 mg Oral Daily Wynetta Fines T, MD   100 mg at 04/22/21 1057     Discharge Medications: Please see discharge summary for a list of discharge medications.  Relevant Imaging Results:  Relevant Lab Results:   Additional Information SS#[id number] [place], [place]

## 2021-05-23 ENCOUNTER — Encounter (HOSPITAL_COMMUNITY): Payer: Self-pay | Admitting: Radiology

## 2021-07-22 ENCOUNTER — Other Ambulatory Visit: Payer: Self-pay | Admitting: Neurosurgery

## 2021-07-22 DIAGNOSIS — S12091A Other nondisplaced fracture of first cervical vertebra, initial encounter for closed fracture: Secondary | ICD-10-CM

## 2021-08-21 ENCOUNTER — Ambulatory Visit
Admission: RE | Admit: 2021-08-21 | Discharge: 2021-08-21 | Disposition: A | Discharge status: Home / Self Care | Payer: Medicare Other | Source: Ambulatory Visit | Attending: Neurosurgery | Admitting: Neurosurgery

## 2021-08-21 DIAGNOSIS — S12091A Other nondisplaced fracture of first cervical vertebra, initial encounter for closed fracture: Secondary | ICD-10-CM

## 2021-09-07 ENCOUNTER — Other Ambulatory Visit: Payer: Self-pay

## 2021-09-07 ENCOUNTER — Emergency Department (HOSPITAL_COMMUNITY)
Admission: EM | Admit: 2021-09-07 | Discharge: 2021-09-08 | Disposition: A | Discharge status: Home / Self Care | Payer: Medicare Other | Attending: Emergency Medicine | Admitting: Emergency Medicine

## 2021-09-07 ENCOUNTER — Emergency Department (HOSPITAL_COMMUNITY): Payer: Medicare Other

## 2021-09-07 DIAGNOSIS — Z79899 Other long term (current) drug therapy: Secondary | ICD-10-CM | POA: Insufficient documentation

## 2021-09-07 DIAGNOSIS — W19XXXA Unspecified fall, initial encounter: Secondary | ICD-10-CM | POA: Insufficient documentation

## 2021-09-07 DIAGNOSIS — F0390 Unspecified dementia without behavioral disturbance: Secondary | ICD-10-CM | POA: Diagnosis not present

## 2021-09-07 DIAGNOSIS — Z794 Long term (current) use of insulin: Secondary | ICD-10-CM | POA: Insufficient documentation

## 2021-09-07 DIAGNOSIS — S0181XA Laceration without foreign body of other part of head, initial encounter: Secondary | ICD-10-CM

## 2021-09-07 DIAGNOSIS — S0990XA Unspecified injury of head, initial encounter: Secondary | ICD-10-CM | POA: Diagnosis present

## 2021-09-07 LAB — CBC WITH DIFFERENTIAL/PLATELET
Abs Immature Granulocytes: 0.03 10*3/uL (ref 0.00–0.07)
Basophils Absolute: 0.1 10*3/uL (ref 0.0–0.1)
Basophils Relative: 1 %
Eosinophils Absolute: 0.4 10*3/uL (ref 0.0–0.5)
Eosinophils Relative: 4 %
HCT: 42 % (ref 36.0–46.0)
Hemoglobin: 13.2 g/dL (ref 12.0–15.0)
Immature Granulocytes: 0 %
Lymphocytes Relative: 29 %
Lymphs Abs: 2.8 10*3/uL (ref 0.7–4.0)
MCH: 30.5 pg (ref 26.0–34.0)
MCHC: 31.4 g/dL (ref 30.0–36.0)
MCV: 97 fL (ref 80.0–100.0)
Monocytes Absolute: 0.6 10*3/uL (ref 0.1–1.0)
Monocytes Relative: 7 %
Neutro Abs: 5.7 10*3/uL (ref 1.7–7.7)
Neutrophils Relative %: 59 %
Platelets: 192 10*3/uL (ref 150–400)
RBC: 4.33 MIL/uL (ref 3.87–5.11)
RDW: 15.1 % (ref 11.5–15.5)
WBC: 9.5 10*3/uL (ref 4.0–10.5)
nRBC: 0 % (ref 0.0–0.2)

## 2021-09-07 LAB — BASIC METABOLIC PANEL
Anion gap: 7 (ref 5–15)
BUN: 21 mg/dL (ref 8–23)
CO2: 32 mmol/L (ref 22–32)
Calcium: 9.2 mg/dL (ref 8.9–10.3)
Chloride: 101 mmol/L (ref 98–111)
Creatinine, Ser: 1.65 mg/dL — ABNORMAL HIGH (ref 0.44–1.00)
GFR, Estimated: 31 mL/min — ABNORMAL LOW (ref >60)
Glucose, Bld: 103 mg/dL — ABNORMAL HIGH (ref 70–99)
Potassium: 4.5 mmol/L (ref 3.5–5.1)
Sodium: 140 mmol/L (ref 135–145)

## 2021-09-07 MED ORDER — LIDOCAINE-EPINEPHRINE (PF) 2 %-1:200000 IJ SOLN
10.0000 mL | Freq: Once | INTRAMUSCULAR | Status: AC
  Administered 2021-09-07: 10 mL via INTRADERMAL
  Filled 2021-09-07: qty 20

## 2021-09-07 NOTE — ED Triage Notes (Signed)
Pt bib gcems from [facility] after an unwitnessed fall. Pt is not on thinners, has dementia and forgets to stay in bed. Axo x4, Lac to R side of forehead, bleeding controlled. Neck brace from previous fall in October. Pt on 3L O2 at baseline, afib at baseline, hx of COPD. BP 120/92, HR 93, CBG 149

## 2021-09-07 NOTE — Discharge Instructions (Signed)
Return for redness drainage or if you get a fever.  The sutures that were used are dissolvable that should dissolve between day 3 and day 5.  If they are still there then you can gently plucked them out with tweezers.  The area can get wet but not fully immersed underwater.  No scrubbing.  If you really want to clean it you can apply a half-and-half hydrogen peroxide solution with water on a Q-tip.  You can apply an ointment a couple times a day this could be as simple as Vaseline but could also be an antibiotic ointment if you wish..  Once it is healed please try to avoid prolonged sun exposure use sunscreen.  Gells that have silicone antigens have been shown to reduce scarring and some research.  Follow-up with a plastic surgeon if you wish.

## 2021-09-07 NOTE — ED Provider Notes (Signed)
[facility] EMERGENCY DEPARTMENT Provider Note   CSN: [id number] Arrival date & time: 09/07/21  2003     History  Chief Complaint  Patient presents with   Lytle Michaels    Dawn Morrow is a 86 y.o. female.  86 yo F with a chief complaints of a fall.  This was reported by the nursing home.  Has a history of dementia and does not remember what happened.  Also extremely hard of hearing.  Level 5 caveat dementia.  The history is provided by the patient.  Fall This is a new problem. The current episode started less than 1 hour ago. The problem occurs constantly. The problem has not changed since onset.Pertinent negatives include no chest pain, no headaches and no shortness of breath. Nothing aggravates the symptoms. Nothing relieves the symptoms. She has tried nothing for the symptoms. The treatment provided no relief.      Home Medications Prior to Admission medications   Medication Sig Start Date End Date Taking? Authorizing Provider  acetaminophen (TYLENOL) 500 MG tablet Take 500 mg by mouth every 4 (four) hours as needed for mild pain.    [provider]  albuterol (PROVENTIL HFA;VENTOLIN HFA) 108 (90 BASE) MCG/ACT inhaler Inhale 2 puffs into the lungs every 6 (six) hours as needed for wheezing or shortness of breath. 08/05/15   Saguier, Percell Miller, PA-C  atorvastatin (LIPITOR) 10 MG tablet Take 1 tablet (10 mg total) by mouth at bedtime. 10/21/15   Colon Branch, MD  buPROPion (WELLBUTRIN XL) 300 MG 24 hr tablet Take 1 tablet (300 mg total) by mouth daily. 07/20/20   [name], MD  carvedilol (COREG) 12.5 MG tablet Take 1 tablet (12.5 mg total) by mouth 2 (two) times daily with a meal. 05/06/20   Dessa Phi, DO  Cholecalciferol (VITAMIN D) 50 MCG (2000 UT) tablet Take 2,000 Units by mouth daily.    [provider]  docusate sodium (COLACE) 100 MG capsule Take 100 mg by mouth 2 (two) times daily.     [provider]  ezetimibe (ZETIA) 10 MG  tablet Take 10 mg by mouth daily.    [provider]  gabapentin (NEURONTIN) 300 MG capsule Take 1 cap in AM, 1 cap at noon, 2 caps at bedtime Patient taking differently: Take 300-600 mg by mouth at bedtime. 300mg  in the morning, 300mg  at noon, and 600mg  at bedtime 12/21/14   Cameron Sprang, MD  guaifenesin (ROBITUSSIN) 100 MG/5ML syrup Take 200 mg by mouth every 4 (four) hours as needed for cough.    [provider]  Insulin Glargine (BASAGLAR KWIKPEN) 100 UNIT/ML Inject 5 Units into the skin at bedtime. 07/19/20   [name], MD  ketoconazole (NIZORAL) 2 % shampoo Apply 1 application topically See admin instructions. Use topically to shampoo hair twice weekly - Tuesday and Friday    [provider]  levothyroxine (SYNTHROID) 88 MCG tablet Take 88 mcg by mouth daily before breakfast.    [provider]  meclizine (ANTIVERT) 25 MG tablet Take 25 mg by mouth at bedtime as needed for dizziness.    [provider]  Multiple Vitamin (MULTIVITAMIN WITH MINERALS) TABS tablet Take 1 tablet by mouth daily. 07/20/20   [name], MD  nystatin (MYCOSTATIN/NYSTOP) powder Apply 1 application topically 2 (two) times daily.    [provider]  ondansetron (ZOFRAN) 4 MG tablet Take 4 mg by mouth every 8 (eight) hours as needed for nausea or vomiting.  [provider]  OXYGEN Inhale 3 L into the lungs continuous.    [provider]  polyethylene glycol (MIRALAX / GLYCOLAX) 17 g packet Take 17 g by mouth daily as needed for mild constipation or moderate constipation.    [provider]  Propylene Glycol 0.6 % SOLN Place 1 drop into both eyes 4 (four) times daily as needed (for dry eyes).     [provider]  sertraline (ZOLOFT) 100 MG tablet Take 1 tablet (100 mg total) by mouth daily. 07/20/20   [name], MD  clindamycin (CLEOCIN) 300 MG capsule Take 1 capsule (300 mg total) by mouth 3 (three) times  daily. Patient not taking: Reported on 07/02/2017 06/17/17 07/02/17  Colon Branch, MD      Allergies    Penicillins, Penicillins, Codeine, Codeine, Sulfa antibiotics, Sulfonylureas, Sulfonylureas, and Sulfonamide derivatives    Review of Systems   Review of Systems  Unable to perform ROS: Dementia  Constitutional:  Negative for chills and fever.  HENT:  Negative for congestion and rhinorrhea.   Eyes:  Negative for redness and visual disturbance.  Respiratory:  Negative for shortness of breath and wheezing.   Cardiovascular:  Negative for chest pain and palpitations.  Gastrointestinal:  Negative for nausea and vomiting.  Genitourinary:  Negative for dysuria and urgency.  Musculoskeletal:  Negative for arthralgias and myalgias.  Skin:  Negative for pallor and wound.  Neurological:  Negative for dizziness and headaches.   Physical Exam Updated Vital Signs BP (!) 157/82    Pulse 70    Temp 97.7 F (36.5 C) (Oral)    Resp (!) 21    SpO2 100%  Physical Exam Vitals and nursing note reviewed.  Constitutional:      General: She is not in acute distress.    Appearance: She is well-developed. She is not diaphoretic.  HENT:     Head: Normocephalic.     Comments: Hematoma to the right frontal region small punctate laceration.  Extraocular motion intact.  No obvious injury intraorally.  No significant pain to the midline C-spine.  Able to rotate her head 45 degrees in either direction without pain. Eyes:     Pupils: Pupils are equal, round, and reactive to light.  Cardiovascular:     Rate and Rhythm: Normal rate. Rhythm irregular.     Heart sounds: No murmur heard.   No friction rub. No gallop.  Pulmonary:     Effort: Pulmonary effort is normal.     Breath sounds: No wheezing or rales.  Abdominal:     General: There is no distension.     Palpations: Abdomen is soft.     Tenderness: There is no abdominal tenderness.  Musculoskeletal:        General: No tenderness.     Cervical back:  Normal range of motion and neck supple.     Comments: Palpated from head to toe without other obvious noted area of bony tenderness.  Skin:    General: Skin is warm and dry.  Neurological:     Mental Status: She is alert and oriented to person, place, and time.  Psychiatric:        Behavior: Behavior normal.    ED Results / Procedures / Treatments   Labs (all labs ordered are listed, but only abnormal results are displayed) Labs Reviewed  BASIC METABOLIC PANEL - Abnormal; Notable for the following components:      Result Value   Glucose, Bld 103 (*)  Creatinine, Ser 1.65 (*)    GFR, Estimated 31 (*)    All other components within normal limits  CBC WITH DIFFERENTIAL/PLATELET    EKG EKG Interpretation  Date/Time:  Sunday September 07 2021 20:10:23 EST Ventricular Rate:  70 PR Interval:    QRS Duration: 89 QT Interval:  395 QTC Calculation: 427 R Axis:   -4 Text Interpretation: Atrial fibrillation No significant change since last tracing Confirmed by Deno Etienne [id number]) on 09/07/2021 8:14:53 PM  Radiology CT Head Wo Contrast  Result Date: 09/07/2021 CLINICAL DATA:  Right frontal head trauma. EXAM: CT HEAD WITHOUT CONTRAST TECHNIQUE: Contiguous axial images were obtained from the base of the skull through the vertex without intravenous contrast. COMPARISON:  Head CT without contrast 04/22/2021 FINDINGS: Brain: Patient motion limits some of the images particularly through the posterior fossa. There is moderately developed cerebral atrophy with atrophic ventriculomegaly moderate to severe small vessel disease of the cerebral white matter, with again noted small scattered chronic bilateral gangliocapsular lacunar infarctions. Cerebellum and brainstem are not well seen but unremarkable, as visualized aside from slight cerebellar atrophy. No asymmetry is seen worrisome for acute infarct, hemorrhage or mass. There is no midline shift. Vascular: Calcifications are again noted in the carotid  siphons and distal left vertebral artery but no hyperdense central vasculature. Skull: There is a small right frontal scalp hematoma in the forehead region. The calvarium, skull base and orbits are intact. No focal bone lesion. Sinuses/Orbits: The paranasal sinuses are unremarkable. There is diffuse fluid opacification increased throughout the left mastoid air cells and middle ear cavity. Trace chronic fluid posterior right mastoid air cells. Old lens extractions. Other: None. IMPRESSION: 1. Motion limited exam grossly negative for acute process or interval changes. 2. Right forehead scalp hematoma. 3. Increased diffuse left mastoid effusion and middle ear fluid consistent with otomastoiditis. 4. Chronic changes including old gangliocapsular lacunar infarcts. Electronically Signed   By: Telford Nab M.D.   On: 09/07/2021 21:31   CT Cervical Spine Wo Contrast  Result Date: 09/07/2021 CLINICAL DATA:  Fall. EXAM: CT CERVICAL SPINE WITHOUT CONTRAST TECHNIQUE: Multidetector CT imaging of the cervical spine was performed without intravenous contrast. Multiplanar CT image reconstructions were also generated. COMPARISON:  Cervical spine CT 08/21/2021 FINDINGS: Alignment: Alignment is anatomic. Skull base and vertebrae: There is no acute fracture or dislocation. Iliac/healed fracture of the left lateral mass of C1 appears unchanged. Healing fracture of anterior osteophyte at T1-T2 appears unchanged. Large anterior osteophytes throughout the cervical spine and upper thoracic spine are otherwise stable. Soft tissues and spinal canal: No prevertebral fluid or swelling. No visible canal hematoma. There are atherosclerotic calcifications of the bilateral bifurcations. Disc levels: Disc space narrowing throughout the cervical spine appears similar to the prior examination. Multilevel mild neural foraminal stenosis on the right appears unchanged. Mild central canal stenosis at C6-C7 secondary to disc osteophyte complex also  appears unchanged. Upper chest: Negative. Other: None. IMPRESSION: 1. No acute fracture or traumatic subluxation of the cervical spine. 2. Healing cervical and upper thoracic spine fractures appear unchanged. 3. Stable multilevel degenerative changes of the cervical spine. Electronically Signed   By: Ronney Asters M.D.   On: 09/07/2021 21:28    Procedures .Marland KitchenLaceration Repair  Date/Time: 09/07/2021 11:02 PM Performed by: Deno Etienne, DO Authorized by: Deno Etienne, DO   Consent:    Consent obtained:  Verbal   Consent given by:  Patient   Risks, benefits, and alternatives were discussed: yes     Risks discussed:  Infection, pain, poor cosmetic result and poor wound healing   Alternatives discussed:  No treatment, delayed treatment and observation Universal protocol:    Imaging studies available: yes (CT)     Patient identity confirmed:  Verbally with patient Anesthesia:    Anesthesia method:  Local infiltration   Local anesthetic:  Lidocaine 2% WITH epi Laceration details:    Location:  Face   Face location:  Forehead   Length (cm):  3.2 Pre-procedure details:    Preparation:  Patient was prepped and draped in usual sterile fashion Exploration:    Limited defect created (wound extended): no     Hemostasis achieved with:  Epinephrine and direct pressure   Imaging obtained comment:  CT   Imaging outcome: foreign body not noted     Wound exploration: entire depth of wound visualized     Wound extent: no muscle damage noted and no underlying fracture noted     Contaminated: no   Treatment:    Area cleansed with:  Chlorhexidine   Amount of cleaning:  Standard   Irrigation solution:  Sterile saline   Irrigation volume:  30   Irrigation method:  Syringe   Visualized foreign bodies/material removed: no     Debridement:  Moderate   Undermining:  Minimal   Scar revision: no   Skin repair:    Repair method:  Sutures   Suture size:  5-0   Suture material:  Fast-absorbing gut   Suture  technique:  Simple interrupted   Number of sutures:  3 Approximation:    Approximation:  Close Repair type:    Repair type:  Simple Post-procedure details:    Dressing:  Antibiotic ointment and adhesive bandage   Procedure completion:  Tolerated well, no immediate complications    Medications Ordered in ED Medications  lidocaine-EPINEPHrine (XYLOCAINE W/EPI) 2 %-1:200000 (PF) injection 10 mL (10 mLs Intradermal Given 09/07/21 2143)    ED Course/ Medical Decision Making/ A&P                           Medical Decision Making  86 yo F with a chief complaints of an unwitnessed fall.  Patient is unsure exactly what happened.  Has a history of dementia.  We will obtain blood work.  CT of the head and C-spine.  Repair wound at bedside.  11:04 PM:  I have discussed the diagnosis/risks/treatment options with the patient and believe the pt to be eligible for discharge home to follow-up with PCP. We also discussed returning to the ED immediately if new or worsening sx occur. We discussed the sx which are most concerning (e.g., sudden worsening pain, fever, inability to tolerate by mouth) that necessitate immediate return. Medications administered to the patient during their visit and any new prescriptions provided to the patient are listed below.  Medications given during this visit Medications  lidocaine-EPINEPHrine (XYLOCAINE W/EPI) 2 %-1:200000 (PF) injection 10 mL (10 mLs Intradermal Given 09/07/21 2143)     The patient appears reasonably screen and/or stabilized for discharge and I doubt any other medical condition or other [facility] requiring further screening, evaluation, or treatment in the ED at this time prior to discharge.          Final Clinical Impression(s) / ED Diagnoses Final diagnoses:  Fall, initial encounter  Laceration of other part of head without foreign body, initial encounter    Rx / DC Orders ED Discharge Orders     None  Deno Etienne, DO 09/07/21  2304

## 2021-09-08 NOTE — ED Notes (Signed)
Patient is resting comfortably. 

## 2021-09-08 NOTE — ED Notes (Signed)
Rn reviewed discharge instructions with PTAR. VSS upon discharge. °

## 2021-10-12 ENCOUNTER — Emergency Department (HOSPITAL_COMMUNITY): Payer: Medicare Other

## 2021-10-12 ENCOUNTER — Inpatient Hospital Stay (HOSPITAL_COMMUNITY)
Admission: EM | Admit: 2021-10-12 | Discharge: 2021-10-15 | DRG: 871 | Disposition: A | Discharge status: Skilled Nursing Facility | Payer: Medicare Other | Source: Skilled Nursing Facility | Attending: Internal Medicine | Admitting: Internal Medicine

## 2021-10-12 DIAGNOSIS — F0283 Dementia in other diseases classified elsewhere, unspecified severity, with mood disturbance: Secondary | ICD-10-CM | POA: Diagnosis present

## 2021-10-12 DIAGNOSIS — E1122 Type 2 diabetes mellitus with diabetic chronic kidney disease: Secondary | ICD-10-CM | POA: Diagnosis present

## 2021-10-12 DIAGNOSIS — H7190 Unspecified cholesteatoma, unspecified ear: Secondary | ICD-10-CM

## 2021-10-12 DIAGNOSIS — E119 Type 2 diabetes mellitus without complications: Secondary | ICD-10-CM | POA: Diagnosis present

## 2021-10-12 DIAGNOSIS — Z86711 Personal history of pulmonary embolism: Secondary | ICD-10-CM

## 2021-10-12 DIAGNOSIS — J9611 Chronic respiratory failure with hypoxia: Secondary | ICD-10-CM | POA: Diagnosis present

## 2021-10-12 DIAGNOSIS — Z6835 Body mass index (BMI) 35.0-35.9, adult: Secondary | ICD-10-CM

## 2021-10-12 DIAGNOSIS — E669 Obesity, unspecified: Secondary | ICD-10-CM | POA: Diagnosis present

## 2021-10-12 DIAGNOSIS — H7192 Unspecified cholesteatoma, left ear: Secondary | ICD-10-CM | POA: Diagnosis present

## 2021-10-12 DIAGNOSIS — G9341 Metabolic encephalopathy: Secondary | ICD-10-CM | POA: Diagnosis present

## 2021-10-12 DIAGNOSIS — Z79890 Hormone replacement therapy (postmenopausal): Secondary | ICD-10-CM

## 2021-10-12 DIAGNOSIS — K219 Gastro-esophageal reflux disease without esophagitis: Secondary | ICD-10-CM | POA: Diagnosis present

## 2021-10-12 DIAGNOSIS — E785 Hyperlipidemia, unspecified: Secondary | ICD-10-CM

## 2021-10-12 DIAGNOSIS — E1165 Type 2 diabetes mellitus with hyperglycemia: Secondary | ICD-10-CM | POA: Diagnosis present

## 2021-10-12 DIAGNOSIS — Z86718 Personal history of other venous thrombosis and embolism: Secondary | ICD-10-CM

## 2021-10-12 DIAGNOSIS — A4151 Sepsis due to Escherichia coli [E. coli]: Secondary | ICD-10-CM | POA: Diagnosis not present

## 2021-10-12 DIAGNOSIS — Z87891 Personal history of nicotine dependence: Secondary | ICD-10-CM

## 2021-10-12 DIAGNOSIS — R6520 Severe sepsis without septic shock: Secondary | ICD-10-CM | POA: Diagnosis present

## 2021-10-12 DIAGNOSIS — Z8673 Personal history of transient ischemic attack (TIA), and cerebral infarction without residual deficits: Secondary | ICD-10-CM

## 2021-10-12 DIAGNOSIS — Z20822 Contact with and (suspected) exposure to covid-19: Secondary | ICD-10-CM | POA: Diagnosis present

## 2021-10-12 DIAGNOSIS — D6959 Other secondary thrombocytopenia: Secondary | ICD-10-CM | POA: Diagnosis present

## 2021-10-12 DIAGNOSIS — I10 Essential (primary) hypertension: Secondary | ICD-10-CM | POA: Diagnosis present

## 2021-10-12 DIAGNOSIS — A4159 Other Gram-negative sepsis: Secondary | ICD-10-CM | POA: Diagnosis present

## 2021-10-12 DIAGNOSIS — D6489 Other specified anemias: Secondary | ICD-10-CM | POA: Diagnosis present

## 2021-10-12 DIAGNOSIS — E872 Acidosis, unspecified: Secondary | ICD-10-CM | POA: Diagnosis present

## 2021-10-12 DIAGNOSIS — E039 Hypothyroidism, unspecified: Secondary | ICD-10-CM | POA: Diagnosis present

## 2021-10-12 DIAGNOSIS — N179 Acute kidney failure, unspecified: Secondary | ICD-10-CM | POA: Diagnosis present

## 2021-10-12 DIAGNOSIS — F32A Depression, unspecified: Secondary | ICD-10-CM | POA: Diagnosis present

## 2021-10-12 DIAGNOSIS — I1310 Hypertensive heart and chronic kidney disease without heart failure, with stage 1 through stage 4 chronic kidney disease, or unspecified chronic kidney disease: Secondary | ICD-10-CM | POA: Diagnosis present

## 2021-10-12 DIAGNOSIS — I480 Paroxysmal atrial fibrillation: Secondary | ICD-10-CM | POA: Diagnosis present

## 2021-10-12 DIAGNOSIS — N3001 Acute cystitis with hematuria: Secondary | ICD-10-CM | POA: Diagnosis not present

## 2021-10-12 DIAGNOSIS — Z9981 Dependence on supplemental oxygen: Secondary | ICD-10-CM

## 2021-10-12 DIAGNOSIS — Z833 Family history of diabetes mellitus: Secondary | ICD-10-CM

## 2021-10-12 DIAGNOSIS — A419 Sepsis, unspecified organism: Secondary | ICD-10-CM | POA: Diagnosis present

## 2021-10-12 DIAGNOSIS — N1831 Chronic kidney disease, stage 3a: Secondary | ICD-10-CM | POA: Diagnosis present

## 2021-10-12 DIAGNOSIS — E1169 Type 2 diabetes mellitus with other specified complication: Secondary | ICD-10-CM | POA: Diagnosis present

## 2021-10-12 DIAGNOSIS — Z9181 History of falling: Secondary | ICD-10-CM

## 2021-10-12 DIAGNOSIS — Z66 Do not resuscitate: Secondary | ICD-10-CM | POA: Diagnosis present

## 2021-10-12 DIAGNOSIS — Z79899 Other long term (current) drug therapy: Secondary | ICD-10-CM

## 2021-10-12 DIAGNOSIS — Z794 Long term (current) use of insulin: Secondary | ICD-10-CM

## 2021-10-12 DIAGNOSIS — J9811 Atelectasis: Secondary | ICD-10-CM

## 2021-10-12 DIAGNOSIS — J449 Chronic obstructive pulmonary disease, unspecified: Secondary | ICD-10-CM | POA: Diagnosis present

## 2021-10-12 LAB — CBC WITH DIFFERENTIAL/PLATELET
Abs Immature Granulocytes: 0.09 10*3/uL — ABNORMAL HIGH (ref 0.00–0.07)
Basophils Absolute: 0.1 10*3/uL (ref 0.0–0.1)
Basophils Relative: 0 %
Eosinophils Absolute: 0 10*3/uL (ref 0.0–0.5)
Eosinophils Relative: 0 %
HCT: 39.2 % (ref 36.0–46.0)
Hemoglobin: 12.3 g/dL (ref 12.0–15.0)
Immature Granulocytes: 1 %
Lymphocytes Relative: 8 %
Lymphs Abs: 1.5 10*3/uL (ref 0.7–4.0)
MCH: 30.5 pg (ref 26.0–34.0)
MCHC: 31.4 g/dL (ref 30.0–36.0)
MCV: 97.3 fL (ref 80.0–100.0)
Monocytes Absolute: 1.1 10*3/uL — ABNORMAL HIGH (ref 0.1–1.0)
Monocytes Relative: 5 %
Neutro Abs: 16.9 10*3/uL — ABNORMAL HIGH (ref 1.7–7.7)
Neutrophils Relative %: 86 %
Platelets: 154 10*3/uL (ref 150–400)
RBC: 4.03 MIL/uL (ref 3.87–5.11)
RDW: 15.5 % (ref 11.5–15.5)
WBC: 19.7 10*3/uL — ABNORMAL HIGH (ref 4.0–10.5)
nRBC: 0 % (ref 0.0–0.2)

## 2021-10-12 LAB — COMPREHENSIVE METABOLIC PANEL
ALT: 26 U/L (ref 0–44)
AST: 28 U/L (ref 15–41)
Albumin: 3.1 g/dL — ABNORMAL LOW (ref 3.5–5.0)
Alkaline Phosphatase: 67 U/L (ref 38–126)
Anion gap: 9 (ref 5–15)
BUN: 25 mg/dL — ABNORMAL HIGH (ref 8–23)
CO2: 30 mmol/L (ref 22–32)
Calcium: 8.6 mg/dL — ABNORMAL LOW (ref 8.9–10.3)
Chloride: 101 mmol/L (ref 98–111)
Creatinine, Ser: 1.31 mg/dL — ABNORMAL HIGH (ref 0.44–1.00)
GFR, Estimated: 40 mL/min — ABNORMAL LOW (ref >60)
Glucose, Bld: 182 mg/dL — ABNORMAL HIGH (ref 70–99)
Potassium: 4 mmol/L (ref 3.5–5.1)
Sodium: 140 mmol/L (ref 135–145)
Total Bilirubin: 0.7 mg/dL (ref 0.3–1.2)
Total Protein: 6.4 g/dL — ABNORMAL LOW (ref 6.5–8.1)

## 2021-10-12 MED ORDER — METRONIDAZOLE 500 MG/100ML IV SOLN
500.0000 mg | Freq: Once | INTRAVENOUS | Status: AC
  Administered 2021-10-12: 500 mg via INTRAVENOUS
  Filled 2021-10-12: qty 100

## 2021-10-12 MED ORDER — SODIUM CHLORIDE 0.9 % IV SOLN: INTRAVENOUS | Status: AC

## 2021-10-12 MED ORDER — SODIUM CHLORIDE 0.9 % IV SOLN
2.0000 g | Freq: Once | INTRAVENOUS | Status: AC
  Administered 2021-10-12: 2 g via INTRAVENOUS
  Filled 2021-10-12: qty 2

## 2021-10-12 MED ORDER — VANCOMYCIN HCL IN DEXTROSE 1-5 GM/200ML-% IV SOLN
1000.0000 mg | Freq: Once | INTRAVENOUS | Status: AC
  Administered 2021-10-12: 1000 mg via INTRAVENOUS
  Filled 2021-10-12: qty 200

## 2021-10-12 NOTE — ED Triage Notes (Signed)
EMS arrival. Temporal temp of 103. Staff called because patient seemed more confused than usual. Sxs onset today. Hx of A-fib, dementia. Normal baseline can talk and have short conversations with others. Given 650 of tylenol at 2223 and 400 ML of NS.

## 2021-10-12 NOTE — ED Triage Notes (Signed)
Given 650 of tylenol at 2223 and 400 ML of NS.

## 2021-10-12 NOTE — ED Triage Notes (Signed)
EMS arrival. Staff called because patient seems more altered than usual. Says Sxs started today. Hx of A-fib. Temporal temp of 103 per EMS.

## 2021-10-12 NOTE — ED Provider Notes (Signed)
[facility] EMERGENCY DEPARTMENT Provider Note  CSN: [id number] Arrival date & time: 10/12/21 2259  Chief Complaint(s) Altered Mental Status ED Triage Notes Lin Landsman, RN (Registered Nurse)   Emergency Medicine   Date of Service: 10/12/2021 10:59 PM   Signed   EMS arrival. Staff called because patient seems more altered than usual. Says Sxs started today. Hx of A-fib. Temporal temp of 103 per EMS.     ED Triage Notes Lin Landsman, RN (Registered Nurse)   Emergency Medicine   Date of Service: 10/12/2021 11:03 PM   Signed   Given 650 of tylenol at 2223 and 400 ML of NS     ED Triage Notes Lin Landsman, RN (Registered Nurse)   Emergency Medicine   Date of Service: 10/12/2021 11:05 PM   Signed   EMS arrival. Temporal temp of 103. Staff called because patient seemed more confused than usual. Sxs onset today. Hx of A-fib, dementia. Normal baseline can talk and have short conversations with others. Given 650 of tylenol at 2223 and 400 ML of NS.     HPI Dawn Morrow is a 86 y.o. female with h/o dementia, COPD on 3LNC, DM, HTN here from sNF for AMS for 1 day. Less conversive than usual. Noted to have fever.   Altered Mental Status  Past Medical History Past Medical History:  Diagnosis Date   Anxiety and depression    Asthma    Cataract    COPD (chronic obstructive pulmonary disease) ([place])    Diabetes mellitus    Diverticulosis    DVT (deep vein thrombosis) in pregnancy    hx x multiple on coumadin   GERD (gastroesophageal reflux disease)    Hemorrhoids    Hiatal hernia    Hyperlipidemia    Hypertension    Hypothyroidism    On home oxygen therapy    "2L; 24/7" (09/17/2017)   Osteoarthritis    h/o spinal stenosis by MRI 2009   Osteopenia    Pulmonary embolism (HCC)    x 2   Stroke [facility])    MINI   Tubular adenoma of colon 07/2005   Patient Active Problem List   Diagnosis Date Noted   C1 cervical fracture ([place]) 04/21/2021   SDH (subdural hematoma)     UTI (urinary tract infection) 07/31/2020   CAP (community acquired pneumonia) 07/31/2020   Pressure injury of skin 07/31/2020   Malnutrition of moderate degree 07/19/2020   Adjustment disorder with depressed mood 07/13/2020   Dehydration 07/12/2020   Fall 93/81/8299   Acute metabolic encephalopathy 37/16/9678   AMS (altered mental status) 03/26/2020   History of pulmonary embolism 03/26/2020   History of DVT (deep vein thrombosis) 03/26/2020   Atrial fibrillation, chronic ([place]) 03/26/2020   Sepsis ([place]) 12/14/2019   Acute encephalopathy 12/14/2019   COVID-19 virus infection 10/02/2019   Acute lower UTI 10/02/2019   AKI (acute kidney injury) ([place]) 10/01/2019   Syncope 09/18/2017   Near syncope 09/17/2017   Diabetes mellitus type 2 in obese ([place]) 09/17/2017   Atrial fibrillation with RVR ([place]) 09/17/2017   Hypomagnesemia 09/17/2017   CKD (chronic kidney disease), stage III ([place]) 09/17/2017   Forehead contusion    MGUS (monoclonal gammopathy of unknown significance) 08/13/2016   Renal failure 07/09/2015   Follow-up ---------PCP NOTES 05/17/2015   Essential tremor 12/28/2014   Ulnar neuropathy of left upper extremity 12/28/2014   Intractable pain 06/21/2014   Back pain 06/21/2014   Lumbar radiculopathy 06/21/2014   Encounter for therapeutic  drug monitoring 03/14/2014   Numbness 10/18/2013   TIA (transient ischemic attack) 09/19/2013   Weakness 09/18/2013   Annual physical exam 05/30/2012   Tremor 01/21/2012   Failure to thrive and poor med compliance  01/21/2012   Pulmonary embolism ([place]) 11/18/2010   DVT (deep venous thrombosis) ([place]) 11/18/2010   Long term current use of anticoagulant 11/18/2010   ABNORMAL ELECTROCARDIOGRAM 11/10/2010   OSTEOARTHRITIS 08/06/2010   DIZZINESS 04/15/2010   Diabetes ([place]) 06/04/2009   ACNE ROSACEA 11/28/2008   BACK PAIN 05/16/2008   HIP PAIN, RIGHT, CHRONIC 09/15/2007   Hypothyroidism 09/13/2007   Dyslipidemia 09/13/2007   Osteoporosis  07/22/2007   DEPRESSION 09/11/2006   HTN (hypertension) 09/11/2006   ASTHMA 09/11/2006   COPD (chronic obstructive pulmonary disease) ([place]) 09/11/2006   GERD 09/11/2006   Recurrent UTI 09/11/2006   [place] FILTER INSERTION, HX OF 09/11/2006   Home Medication(s) Prior to Admission medications   Medication Sig Start Date End Date Taking? Authorizing Provider  acetaminophen (TYLENOL) 500 MG tablet Take 500 mg by mouth every 4 (four) hours as needed for mild pain.   Yes [provider]  albuterol (PROVENTIL HFA;VENTOLIN HFA) 108 (90 BASE) MCG/ACT inhaler Inhale 2 puffs into the lungs every 6 (six) hours as needed for wheezing or shortness of breath. 08/05/15  Yes Saguier, Percell Miller, PA-C  atorvastatin (LIPITOR) 10 MG tablet Take 1 tablet (10 mg total) by mouth at bedtime. 10/21/15  Yes Paz, Alda Berthold, MD  buPROPion (WELLBUTRIN XL) 300 MG 24 hr tablet Take 1 tablet (300 mg total) by mouth daily. 07/20/20  Yes [name], MD  carvedilol (COREG) 12.5 MG tablet Take 1 tablet (12.5 mg total) by mouth 2 (two) times daily with a meal. 05/06/20  Yes Dessa Phi, DO  Cholecalciferol (VITAMIN D) 50 MCG (2000 UT) tablet Take 2,000 Units by mouth daily.   Yes [provider]  docusate sodium (COLACE) 100 MG capsule Take 100 mg by mouth 2 (two) times daily.    Yes [provider]  ezetimibe (ZETIA) 10 MG tablet Take 10 mg by mouth daily.   Yes [provider]  gabapentin (NEURONTIN) 300 MG capsule Take 1 cap in AM, 1 cap at noon, 2 caps at bedtime Patient taking differently: Take 300-600 mg by mouth See admin instructions. 300mg  in the morning, 300mg  at noon, and 600mg  at bedtime 12/21/14  Yes Cameron Sprang, MD  Insulin Glargine [facility]) 100 UNIT/ML Inject 5 Units into the skin at bedtime. 07/19/20  Yes [name], MD  ketoconazole (NIZORAL) 2 % shampoo Apply 1 application topically See admin instructions. Use topically to shampoo hair twice weekly -  Tuesday and Friday   Yes [provider]  levothyroxine (SYNTHROID) 100 MCG tablet Take 88 mcg by mouth daily at 6 PM.   Yes [provider]  meclizine (ANTIVERT) 25 MG tablet Take 25 mg by mouth at bedtime as needed for dizziness.   Yes [provider]  Multiple Vitamin (MULTIVITAMIN WITH MINERALS) TABS tablet Take 1 tablet by mouth daily. 07/20/20  Yes [name], MD  nystatin (MYCOSTATIN/NYSTOP) powder Apply 1 application topically 2 (two) times daily.   Yes [provider]  ondansetron (ZOFRAN) 4 MG tablet Take 4 mg by mouth every 8 (eight) hours as needed for nausea or vomiting.   Yes [provider]  OXYGEN Inhale 3 L into the lungs continuous.   Yes [provider]  polyethylene glycol (MIRALAX / GLYCOLAX) 17 g packet Take 17 g  by mouth daily as needed for mild constipation or moderate constipation.   Yes [provider]  Propylene Glycol 0.6 % SOLN Place 1 drop into both eyes 4 (four) times daily as needed (for dry eyes).    Yes [provider]  sertraline (ZOLOFT) 100 MG tablet Take 1 tablet (100 mg total) by mouth daily. 07/20/20  Yes [name], MD  clindamycin (CLEOCIN) 300 MG capsule Take 1 capsule (300 mg total) by mouth 3 (three) times daily. Patient not taking: Reported on 07/02/2017 06/17/17 07/02/17  Colon Branch, MD                                                                                                                                    Allergies Penicillins, Penicillins, Codeine, Codeine, Sulfa antibiotics, Sulfonylureas, Sulfonylureas, and Sulfonamide derivatives  Review of Systems Review of Systems As noted in HPI  Physical Exam Vital Signs  I have reviewed the triage vital signs BP 134/60    Pulse (!) 109    Temp (!) 102 F (38.9 C) (Rectal)    Resp 16    Ht 5' (1.524 m)    Wt 81.4 kg    SpO2 93%    BMI 35.05 kg/m   Physical Exam  ED Results and Treatments Labs (all labs  ordered are listed, but only abnormal results are displayed) Labs Reviewed  LACTIC ACID, PLASMA - Abnormal; Notable for the following components:      Result Value   Lactic Acid, Venous 2.2 (*)    All other components within normal limits  COMPREHENSIVE METABOLIC PANEL - Abnormal; Notable for the following components:   Glucose, Bld 182 (*)    BUN 25 (*)    Creatinine, Ser 1.31 (*)    Calcium 8.6 (*)    Total Protein 6.4 (*)    Albumin 3.1 (*)    GFR, Estimated 40 (*)    All other components within normal limits  CBC WITH DIFFERENTIAL/PLATELET - Abnormal; Notable for the following components:   WBC 19.7 (*)    Neutro Abs 16.9 (*)    Monocytes Absolute 1.1 (*)    Abs Immature Granulocytes 0.09 (*)    All other components within normal limits  PROTIME-INR - Abnormal; Notable for the following components:   Prothrombin Time 16.3 (*)    INR 1.3 (*)    All other components within normal limits  URINALYSIS, ROUTINE W REFLEX MICROSCOPIC - Abnormal; Notable for the following components:   APPearance CLOUDY (*)    Hgb urine dipstick MODERATE (*)    Nitrite POSITIVE (*)    Leukocytes,Ua LARGE (*)    All other components within normal limits  URINALYSIS, MICROSCOPIC (REFLEX) - Abnormal; Notable for the following components:   Bacteria, UA MANY (*)    All other components within normal limits  I-STAT VENOUS BLOOD GAS, ED - Abnormal; Notable for the following components:   pO2,  Ven 159.0 (*)    Bicarbonate 32.4 (*)    TCO2 34 (*)    Acid-Base Excess 7.0 (*)    Calcium, Ion 0.94 (*)    All other components within normal limits  I-STAT CHEM 8, ED - Abnormal; Notable for the following components:   BUN 24 (*)    Creatinine, Ser 1.20 (*)    Glucose, Bld 173 (*)    Calcium, Ion 0.94 (*)    All other components within normal limits  RESP PANEL BY RT-PCR (FLU A&B, COVID) ARPGX2  CULTURE, BLOOD (ROUTINE X 2)  CULTURE, BLOOD (ROUTINE X 2)  URINE CULTURE  APTT  LACTIC ACID, PLASMA                                                                                                                          EKG  EKG Interpretation  Date/Time:  Monday October 13 2021 00:40:35 EST Ventricular Rate:  102 PR Interval:    QRS Duration: 81 QT Interval:  348 QTC Calculation: 454 R Axis:   -31 Text Interpretation: Atrial fibrillation Ventricular premature complex Consider anterior infarct Confirmed by Addison Lank [phone]) on 10/13/2021 12:50:47 AM       Radiology DG Chest Port 1 View  Result Date: 10/12/2021 CLINICAL DATA:  Questionable sepsis - evaluate for abnormality EXAM: PORTABLE CHEST 1 VIEW COMPARISON:  04/21/2021 FINDINGS: Mild cardiomegaly. Aortic atherosclerosis. Right lung clear. Left lower lobe airspace opacity. No effusions or acute bony abnormality. IMPRESSION: Cardiomegaly. Left lower lobe atelectasis or infiltrate. Electronically Signed   By: Rolm Baptise M.D.   On: 10/12/2021 23:42    Pertinent labs & imaging results that were available during my care of the patient were reviewed by me and considered in my medical decision making (see MDM for details).  Medications Ordered in ED Medications  0.9 %  sodium chloride infusion ( Intravenous New Bag/Given 10/12/21 2319)  aztreonam (AZACTAM) 2 g in sodium chloride 0.9 % 100 mL IVPB (2 g Intravenous New Bag/Given 10/12/21 2355)  metroNIDAZOLE (FLAGYL) IVPB 500 mg (500 mg Intravenous New Bag/Given 10/12/21 2356)  vancomycin (VANCOCIN) IVPB 1000 mg/200 mL premix (1,000 mg Intravenous New Bag/Given 10/12/21 2358)                                                                                                                                     Procedures .1-3 Lead  EKG Interpretation Performed by: Fatima Blank, MD Authorized by: Fatima Blank, MD     Interpretation: abnormal     ECG rate:  103   ECG rate assessment: tachycardic     Rhythm: atrial fibrillation     Ectopy: none     Conduction: normal    .Critical Care Performed by: Fatima Blank, MD Authorized by: Fatima Blank, MD   Critical care provider statement:    Critical care time (minutes):  45   Critical care time was exclusive of:  Separately billable procedures and treating other patients   Critical care was necessary to treat or prevent imminent or life-threatening deterioration of the following conditions:  Sepsis   Critical care was time spent personally by me on the following activities:  Development of treatment plan with patient or surrogate, discussions with consultants, evaluation of patient's response to treatment, examination of patient, obtaining history from patient or surrogate, review of old charts, re-evaluation of patient's condition, pulse oximetry, ordering and review of radiographic studies, ordering and review of laboratory studies and ordering and performing treatments and interventions   Care discussed with: admitting provider    (including critical care time)  Medical Decision Making / ED Course        AMS Noted to be fever. Likely infectious source. Will assess for respiratory, or urinary source. No evidence of skin infection. Abd benign, doubt GI infection. CODE sepsis initiated.  Work-up ordered to assess concerns above. Labs and imaging Independently interpreted by me and noted below: CBC with significant leukocytosis and left shift.  No anemia. Metabolic panel with evidence of AKI.  Hyperglycemia without evidence of DKA. Chest x-ray notable for possible left lower lobe infiltrate versus atelectasis.  Confirmed by radiology. UA is consistent with infection -likely the source. Lactic acid elevated at 2.2  Management: IVF. Already go 500 cc from EMS. Infusion started. Does not require 30cc/kg at this time given normal BPs. Empiric Abx started  Reassessment: Stable. Sating well on home O2 level   Sepsis from likely urinary source requiring admission. Admitted to hospitalist  service.  Final Clinical Impression(s) / ED Diagnoses Final diagnoses:  Sepsis with acute renal failure without septic shock, due to unspecified organism, unspecified acute renal failure type ([place])  Acute cystitis with hematuria           This chart was dictated using voice recognition software.  Despite best efforts to proofread,  errors can occur which can change the documentation meaning.    Fatima Blank, MD 10/13/21 [phone]

## 2021-10-12 NOTE — Progress Notes (Signed)
Pt being followed by [facility] for Sepsis protocol. 

## 2021-10-13 ENCOUNTER — Inpatient Hospital Stay (HOSPITAL_COMMUNITY): Payer: Medicare Other

## 2021-10-13 DIAGNOSIS — D6959 Other secondary thrombocytopenia: Secondary | ICD-10-CM | POA: Diagnosis present

## 2021-10-13 DIAGNOSIS — F0283 Dementia in other diseases classified elsewhere, unspecified severity, with mood disturbance: Secondary | ICD-10-CM | POA: Diagnosis present

## 2021-10-13 DIAGNOSIS — R6520 Severe sepsis without septic shock: Secondary | ICD-10-CM | POA: Diagnosis present

## 2021-10-13 DIAGNOSIS — A419 Sepsis, unspecified organism: Secondary | ICD-10-CM

## 2021-10-13 DIAGNOSIS — E872 Acidosis, unspecified: Secondary | ICD-10-CM | POA: Diagnosis present

## 2021-10-13 DIAGNOSIS — G9341 Metabolic encephalopathy: Secondary | ICD-10-CM | POA: Diagnosis present

## 2021-10-13 DIAGNOSIS — E669 Obesity, unspecified: Secondary | ICD-10-CM | POA: Diagnosis present

## 2021-10-13 DIAGNOSIS — J9611 Chronic respiratory failure with hypoxia: Secondary | ICD-10-CM | POA: Diagnosis present

## 2021-10-13 DIAGNOSIS — I480 Paroxysmal atrial fibrillation: Secondary | ICD-10-CM | POA: Diagnosis present

## 2021-10-13 DIAGNOSIS — J438 Other emphysema: Secondary | ICD-10-CM

## 2021-10-13 DIAGNOSIS — Z66 Do not resuscitate: Secondary | ICD-10-CM | POA: Diagnosis present

## 2021-10-13 DIAGNOSIS — N1831 Chronic kidney disease, stage 3a: Secondary | ICD-10-CM | POA: Diagnosis present

## 2021-10-13 DIAGNOSIS — E039 Hypothyroidism, unspecified: Secondary | ICD-10-CM | POA: Diagnosis present

## 2021-10-13 DIAGNOSIS — E1122 Type 2 diabetes mellitus with diabetic chronic kidney disease: Secondary | ICD-10-CM | POA: Diagnosis present

## 2021-10-13 DIAGNOSIS — A4151 Sepsis due to Escherichia coli [E. coli]: Secondary | ICD-10-CM | POA: Diagnosis present

## 2021-10-13 DIAGNOSIS — N179 Acute kidney failure, unspecified: Secondary | ICD-10-CM | POA: Diagnosis present

## 2021-10-13 DIAGNOSIS — J9811 Atelectasis: Secondary | ICD-10-CM | POA: Diagnosis present

## 2021-10-13 DIAGNOSIS — I1310 Hypertensive heart and chronic kidney disease without heart failure, with stage 1 through stage 4 chronic kidney disease, or unspecified chronic kidney disease: Secondary | ICD-10-CM | POA: Diagnosis present

## 2021-10-13 DIAGNOSIS — E785 Hyperlipidemia, unspecified: Secondary | ICD-10-CM | POA: Diagnosis present

## 2021-10-13 DIAGNOSIS — Z20822 Contact with and (suspected) exposure to covid-19: Secondary | ICD-10-CM | POA: Diagnosis present

## 2021-10-13 DIAGNOSIS — J449 Chronic obstructive pulmonary disease, unspecified: Secondary | ICD-10-CM | POA: Diagnosis present

## 2021-10-13 DIAGNOSIS — A4159 Other Gram-negative sepsis: Secondary | ICD-10-CM | POA: Diagnosis present

## 2021-10-13 DIAGNOSIS — E1165 Type 2 diabetes mellitus with hyperglycemia: Secondary | ICD-10-CM | POA: Diagnosis present

## 2021-10-13 DIAGNOSIS — N3001 Acute cystitis with hematuria: Secondary | ICD-10-CM | POA: Diagnosis present

## 2021-10-13 DIAGNOSIS — D6489 Other specified anemias: Secondary | ICD-10-CM | POA: Diagnosis present

## 2021-10-13 DIAGNOSIS — E1169 Type 2 diabetes mellitus with other specified complication: Secondary | ICD-10-CM

## 2021-10-13 DIAGNOSIS — F32A Depression, unspecified: Secondary | ICD-10-CM | POA: Diagnosis present

## 2021-10-13 LAB — URINALYSIS, ROUTINE W REFLEX MICROSCOPIC
Bilirubin Urine: NEGATIVE
Glucose, UA: NEGATIVE mg/dL
Ketones, ur: NEGATIVE mg/dL
Nitrite: POSITIVE — AB
Protein, ur: NEGATIVE mg/dL
Specific Gravity, Urine: 1.015 (ref 1.005–1.030)
pH: 6 (ref 5.0–8.0)

## 2021-10-13 LAB — APTT: aPTT: 32 seconds (ref 24–36)

## 2021-10-13 LAB — I-STAT VENOUS BLOOD GAS, ED
Acid-Base Excess: 7 mmol/L — ABNORMAL HIGH (ref 0.0–2.0)
Bicarbonate: 32.4 mmol/L — ABNORMAL HIGH (ref 20.0–28.0)
Calcium, Ion: 0.94 mmol/L — ABNORMAL LOW (ref 1.15–1.40)
HCT: 40 % (ref 36.0–46.0)
Hemoglobin: 13.6 g/dL (ref 12.0–15.0)
O2 Saturation: 99 %
Potassium: 4.1 mmol/L (ref 3.5–5.1)
Sodium: 141 mmol/L (ref 135–145)
TCO2: 34 mmol/L — ABNORMAL HIGH (ref 22–32)
pCO2, Ven: 49.6 mmHg (ref 44.0–60.0)
pH, Ven: 7.423 (ref 7.250–7.430)
pO2, Ven: 159 mmHg — ABNORMAL HIGH (ref 32.0–45.0)

## 2021-10-13 LAB — CBC
HCT: 32.2 % — ABNORMAL LOW (ref 36.0–46.0)
HCT: 40.9 % (ref 36.0–46.0)
Hemoglobin: 9.9 g/dL — ABNORMAL LOW (ref 12.0–15.0)
Hemoglobin: 12.9 g/dL (ref 12.0–15.0)
MCH: 30.2 pg (ref 26.0–34.0)
MCH: 30.9 pg (ref 26.0–34.0)
MCHC: 30.7 g/dL (ref 30.0–36.0)
MCHC: 31.5 g/dL (ref 30.0–36.0)
MCV: 98.2 fL (ref 80.0–100.0)
MCV: 97.8 fL (ref 80.0–100.0)
Platelets: 124 10*3/uL — ABNORMAL LOW (ref 150–400)
Platelets: 131 10*3/uL — ABNORMAL LOW (ref 150–400)
RBC: 3.28 MIL/uL — ABNORMAL LOW (ref 3.87–5.11)
RBC: 4.18 MIL/uL (ref 3.87–5.11)
RDW: 15.6 % — ABNORMAL HIGH (ref 11.5–15.5)
RDW: 15.6 % — ABNORMAL HIGH (ref 11.5–15.5)
WBC: 19.9 10*3/uL — ABNORMAL HIGH (ref 4.0–10.5)
WBC: 22.4 10*3/uL — ABNORMAL HIGH (ref 4.0–10.5)
nRBC: 0 % (ref 0.0–0.2)
nRBC: 0 % (ref 0.0–0.2)

## 2021-10-13 LAB — I-STAT CHEM 8, ED
BUN: 24 mg/dL — ABNORMAL HIGH (ref 8–23)
Calcium, Ion: 0.94 mmol/L — ABNORMAL LOW (ref 1.15–1.40)
Chloride: 98 mmol/L (ref 98–111)
Creatinine, Ser: 1.2 mg/dL — ABNORMAL HIGH (ref 0.44–1.00)
Glucose, Bld: 173 mg/dL — ABNORMAL HIGH (ref 70–99)
HCT: 40 % (ref 36.0–46.0)
Hemoglobin: 13.6 g/dL (ref 12.0–15.0)
Potassium: 4 mmol/L (ref 3.5–5.1)
Sodium: 141 mmol/L (ref 135–145)
TCO2: 30 mmol/L (ref 22–32)

## 2021-10-13 LAB — PROTIME-INR
INR: 1.3 — ABNORMAL HIGH (ref 0.8–1.2)
Prothrombin Time: 16.3 seconds — ABNORMAL HIGH (ref 11.4–15.2)

## 2021-10-13 LAB — TSH: TSH: 3.837 u[IU]/mL (ref 0.350–4.500)

## 2021-10-13 LAB — AMMONIA: Ammonia: 17 umol/L (ref 9–35)

## 2021-10-13 LAB — GLUCOSE, CAPILLARY
Glucose-Capillary: 82 mg/dL (ref 70–99)
Glucose-Capillary: 160 mg/dL — ABNORMAL HIGH (ref 70–99)

## 2021-10-13 LAB — URINALYSIS, MICROSCOPIC (REFLEX): WBC, UA: >50 WBC/hpf (ref 0–5)

## 2021-10-13 LAB — HEMOGLOBIN A1C
Hgb A1c MFr Bld: 5.8 % — ABNORMAL HIGH (ref 4.8–5.6)
Mean Plasma Glucose: 119.76 mg/dL

## 2021-10-13 LAB — LACTIC ACID, PLASMA
Lactic Acid, Venous: 2.2 mmol/L (ref 0.5–1.9)
Lactic Acid, Venous: 2.2 mmol/L (ref 0.5–1.9)

## 2021-10-13 LAB — RESP PANEL BY RT-PCR (FLU A&B, COVID) ARPGX2
Influenza A by PCR: NEGATIVE
Influenza B by PCR: NEGATIVE
SARS Coronavirus 2 by RT PCR: NEGATIVE

## 2021-10-13 LAB — CBG MONITORING, ED
Glucose-Capillary: 122 mg/dL — ABNORMAL HIGH (ref 70–99)
Glucose-Capillary: 81 mg/dL (ref 70–99)

## 2021-10-13 LAB — VITAMIN B12: Vitamin B-12: 277 pg/mL (ref 180–914)

## 2021-10-13 MED ORDER — INSULIN ASPART 100 UNIT/ML IJ SOLN: 0.0000 [IU] | Freq: Every day | INTRAMUSCULAR | Status: DC

## 2021-10-13 MED ORDER — ATORVASTATIN CALCIUM 10 MG PO TABS
10.0000 mg | ORAL_TABLET | Freq: Every day | ORAL | Status: DC
  Administered 2021-10-13 – 2021-10-14 (×2): 10 mg via ORAL
  Filled 2021-10-13 (×2): qty 1

## 2021-10-13 MED ORDER — INSULIN ASPART 100 UNIT/ML IJ SOLN
0.0000 [IU] | Freq: Three times a day (TID) | INTRAMUSCULAR | Status: DC
  Administered 2021-10-13: 1 [IU] via SUBCUTANEOUS
  Administered 2021-10-14: 2 [IU] via SUBCUTANEOUS

## 2021-10-13 MED ORDER — GABAPENTIN 300 MG PO CAPS
300.0000 mg | ORAL_CAPSULE | Freq: Two times a day (BID) | ORAL | Status: DC
  Administered 2021-10-13 – 2021-10-15 (×4): 300 mg via ORAL
  Filled 2021-10-13 (×4): qty 1

## 2021-10-13 MED ORDER — IPRATROPIUM-ALBUTEROL 0.5-2.5 (3) MG/3ML IN SOLN: 3.0000 mL | Freq: Four times a day (QID) | RESPIRATORY_TRACT | Status: DC | PRN

## 2021-10-13 MED ORDER — EZETIMIBE 10 MG PO TABS
10.0000 mg | ORAL_TABLET | Freq: Every day | ORAL | Status: DC
  Administered 2021-10-13 – 2021-10-15 (×3): 10 mg via ORAL
  Filled 2021-10-13 (×3): qty 1

## 2021-10-13 MED ORDER — VANCOMYCIN HCL 1500 MG/300ML IV SOLN: 1500.0000 mg | INTRAVENOUS | Status: DC

## 2021-10-13 MED ORDER — BASAGLAR KWIKPEN 100 UNIT/ML ~~LOC~~ SOPN: 5.0000 [IU] | PEN_INJECTOR | Freq: Every day | SUBCUTANEOUS | Status: DC

## 2021-10-13 MED ORDER — INSULIN GLARGINE-YFGN 100 UNIT/ML ~~LOC~~ SOLN
5.0000 [IU] | Freq: Every day | SUBCUTANEOUS | Status: DC
  Administered 2021-10-13 – 2021-10-14 (×2): 5 [IU] via SUBCUTANEOUS
  Filled 2021-10-13 (×3): qty 0.05

## 2021-10-13 MED ORDER — METRONIDAZOLE 500 MG/100ML IV SOLN
500.0000 mg | Freq: Two times a day (BID) | INTRAVENOUS | Status: DC
  Administered 2021-10-13 – 2021-10-14 (×3): 500 mg via INTRAVENOUS
  Filled 2021-10-13 (×3): qty 100

## 2021-10-13 MED ORDER — SODIUM CHLORIDE 0.9 % IV SOLN
1.0000 g | Freq: Three times a day (TID) | INTRAVENOUS | Status: DC
  Administered 2021-10-14: 1 g via INTRAVENOUS
  Filled 2021-10-13 (×5): qty 1

## 2021-10-13 MED ORDER — VANCOMYCIN HCL 500 MG/100ML IV SOLN
500.0000 mg | Freq: Once | INTRAVENOUS | Status: AC
  Administered 2021-10-13: 500 mg via INTRAVENOUS
  Filled 2021-10-13: qty 100

## 2021-10-13 MED ORDER — BUPROPION HCL ER (XL) 150 MG PO TB24
300.0000 mg | ORAL_TABLET | Freq: Every day | ORAL | Status: DC
  Administered 2021-10-13 – 2021-10-15 (×3): 300 mg via ORAL
  Filled 2021-10-13 (×2): qty 2
  Filled 2021-10-13: qty 1

## 2021-10-13 MED ORDER — GABAPENTIN 300 MG PO CAPS: 300.0000 mg | ORAL_CAPSULE | ORAL | Status: DC

## 2021-10-13 MED ORDER — ACETAMINOPHEN 650 MG RE SUPP: 650.0000 mg | Freq: Four times a day (QID) | RECTAL | Status: DC | PRN

## 2021-10-13 MED ORDER — GABAPENTIN 300 MG PO CAPS
600.0000 mg | ORAL_CAPSULE | Freq: Every day | ORAL | Status: DC
  Administered 2021-10-13 – 2021-10-14 (×2): 600 mg via ORAL
  Filled 2021-10-13 (×2): qty 2

## 2021-10-13 MED ORDER — ACETAMINOPHEN 325 MG PO TABS: 650.0000 mg | ORAL_TABLET | Freq: Four times a day (QID) | ORAL | Status: DC | PRN

## 2021-10-13 MED ORDER — LEVOTHYROXINE SODIUM 88 MCG PO TABS
88.0000 ug | ORAL_TABLET | Freq: Every day | ORAL | Status: DC
  Administered 2021-10-14 – 2021-10-15 (×2): 88 ug via ORAL
  Filled 2021-10-13 (×3): qty 1

## 2021-10-13 MED ORDER — SERTRALINE HCL 100 MG PO TABS
100.0000 mg | ORAL_TABLET | Freq: Every day | ORAL | Status: DC
  Administered 2021-10-13 – 2021-10-15 (×3): 100 mg via ORAL
  Filled 2021-10-13 (×3): qty 1

## 2021-10-13 NOTE — Assessment & Plan Note (Addendum)
Secondary to UTI

## 2021-10-13 NOTE — Assessment & Plan Note (Signed)
Continue bupropion and sertraline 

## 2021-10-13 NOTE — Assessment & Plan Note (Addendum)
Acute metabolic encephalopathy on chronic dementia due to severe sepsis due to UTI: Due to hx CVA and SDH/falls, CT head performed showed no acute changes, no ICH/SDH. TSH checked with hx hypothyroidism is 3.837, B11 wnl, ammonia 17.  - Continue antibiotics pending urine and blood culture  - Delirium precautions.

## 2021-10-13 NOTE — Assessment & Plan Note (Addendum)
Continue Synthroid °

## 2021-10-13 NOTE — Assessment & Plan Note (Addendum)
Chronic hypoxic respiratory failure Stable on 3 L home oxygen.  No wheezing or respiratory distress. -DuoNeb as needed

## 2021-10-13 NOTE — Progress Notes (Signed)
Patient able to drink with straw with no problem and had crackers. Able to swallow with no difficulty. Ordered Regular diet per MD.

## 2021-10-13 NOTE — Assessment & Plan Note (Addendum)
Continue home long-acting insulin.  Order sliding scale insulin sensitive.

## 2021-10-13 NOTE — Assessment & Plan Note (Signed)
Continue Lipitor and Zetia 

## 2021-10-13 NOTE — Assessment & Plan Note (Addendum)
Restart coreg She is not on anticoagulation due to history of frequent falls and subdural hematoma in August 2022.

## 2021-10-13 NOTE — H&P (Signed)
History and Physical    Dawn Morrow JFH:[id number] DOB: 20-Oct-1935 DOA: 10/12/2021  DOS: the patient was seen and examined on 10/12/2021  PCP: Wenda Low, MD   Patient coming from: SNF  I have personally briefly reviewed patient's old medical records in [place]  Patient is a 86 year old female with a past medical history of dementia, COPD on 3 L home oxygen, type II diabetes, neuropathy, CKD stage IIIa, paroxysmal A. fib not on anticoagulation due to history of frequent falls and subdural hematoma in August 2022, history of DVT/PE, GERD, hypertension, hyperlipidemia, hypothyroidism, CVA/TIA, depression, anxiety presenting to the ED via EMS for evaluation of altered mental status.  Temperature 103 F with EMS and was given Tylenol and 500 cc fluid bolus.  Staff at her facility reported that she has dementia but normally talks, today she was less conversive.  In the ED, febrile and tachycardic.  Labs showing WBC 19.7.  Lactic acid 2.2.  UA with positive nitrite, large amount of leukocytes, greater than 50 WBCs, and many bacteria.  Urine culture pending.  COVID and flu negative.  Chest x-ray showing left lower lobe airspace opacity concerning for atelectasis versus infiltrate.  No brain imaging done. Blood cultures drawn and she was given vancomycin, Flagyl, and aztreonam.  Patient is confused and not able to give any history.   Review of Systems:  ROS  Past Medical History:  Diagnosis Date   Anxiety and depression    Asthma    Cataract    COPD (chronic obstructive pulmonary disease) ([place])    Diabetes mellitus    Diverticulosis    DVT (deep vein thrombosis) in pregnancy    hx x multiple on coumadin   GERD (gastroesophageal reflux disease)    Hemorrhoids    Hiatal hernia    Hyperlipidemia    Hypertension    Hypothyroidism    On home oxygen therapy    "2L; 24/7" (09/17/2017)   Osteoarthritis    h/o spinal stenosis by MRI 2009   Osteopenia    Pulmonary embolism (HCC)    x  2   Stroke [facility])    MINI   Tubular adenoma of colon 07/2005    Past Surgical History:  Procedure Laterality Date   APPENDECTOMY     CATARACT EXTRACTION     CHOLECYSTECTOMY     LUMBAR FUSION     POLYPECTOMY     TONSILLECTOMY     TUBAL LIGATION       reports that she has quit smoking. Her smoking use included cigarettes. She has never used smokeless tobacco. She reports that she does not drink alcohol and does not use drugs.  Allergies  Allergen Reactions   Penicillins Anaphylaxis   Penicillins Anaphylaxis    Per duplicate chart   Codeine Other (See Comments)    Listed on MAR   Codeine Other (See Comments)    Unknown reaction   Sulfa Antibiotics Other (See Comments)    Unknown reaction   Sulfonylureas Other (See Comments)    Listed on MAR   Sulfonylureas Other (See Comments)    Unknown reaction   Sulfonamide Derivatives Other (See Comments)    Unknown reaction    Family History  Adopted: Yes  Problem Relation Age of Onset   Cancer Mother        ? colon or ovarian   Diabetes Mother    Cancer Brother        ?   CAD Neg Hx  Prior to Admission medications   Medication Sig Start Date End Date Taking? Authorizing Provider  acetaminophen (TYLENOL) 500 MG tablet Take 500 mg by mouth every 4 (four) hours as needed for mild pain.   Yes [provider]  albuterol (PROVENTIL HFA;VENTOLIN HFA) 108 (90 BASE) MCG/ACT inhaler Inhale 2 puffs into the lungs every 6 (six) hours as needed for wheezing or shortness of breath. 08/05/15  Yes Saguier, Percell Miller, PA-C  atorvastatin (LIPITOR) 10 MG tablet Take 1 tablet (10 mg total) by mouth at bedtime. 10/21/15  Yes Paz, Alda Berthold, MD  buPROPion (WELLBUTRIN XL) 300 MG 24 hr tablet Take 1 tablet (300 mg total) by mouth daily. 07/20/20  Yes [name], MD  carvedilol (COREG) 12.5 MG tablet Take 1 tablet (12.5 mg total) by mouth 2 (two) times daily with a meal. 05/06/20  Yes Dessa Phi, DO  Cholecalciferol (VITAMIN D) 50 MCG  (2000 UT) tablet Take 2,000 Units by mouth daily.   Yes [provider]  docusate sodium (COLACE) 100 MG capsule Take 100 mg by mouth 2 (two) times daily.    Yes [provider]  ezetimibe (ZETIA) 10 MG tablet Take 10 mg by mouth daily.   Yes [provider]  gabapentin (NEURONTIN) 300 MG capsule Take 1 cap in AM, 1 cap at noon, 2 caps at bedtime Patient taking differently: Take 300-600 mg by mouth See admin instructions. 300mg  in the morning, 300mg  at noon, and 600mg  at bedtime 12/21/14  Yes Cameron Sprang, MD  Insulin Glargine [facility]) 100 UNIT/ML Inject 5 Units into the skin at bedtime. 07/19/20  Yes [name], MD  ketoconazole (NIZORAL) 2 % shampoo Apply 1 application topically See admin instructions. Use topically to shampoo hair twice weekly - Tuesday and Friday   Yes [provider]  levothyroxine (SYNTHROID) 100 MCG tablet Take 88 mcg by mouth daily at 6 PM.   Yes [provider]  meclizine (ANTIVERT) 25 MG tablet Take 25 mg by mouth at bedtime as needed for dizziness.   Yes [provider]  Multiple Vitamin (MULTIVITAMIN WITH MINERALS) TABS tablet Take 1 tablet by mouth daily. 07/20/20  Yes [name], MD  nystatin (MYCOSTATIN/NYSTOP) powder Apply 1 application topically 2 (two) times daily.   Yes [provider]  ondansetron (ZOFRAN) 4 MG tablet Take 4 mg by mouth every 8 (eight) hours as needed for nausea or vomiting.   Yes [provider]  OXYGEN Inhale 3 L into the lungs continuous.   Yes [provider]  polyethylene glycol (MIRALAX / GLYCOLAX) 17 g packet Take 17 g by mouth daily as needed for mild constipation or moderate constipation.   Yes [provider]  Propylene Glycol 0.6 % SOLN Place 1 drop into both eyes 4 (four) times daily as needed (for dry eyes).    Yes [provider]  sertraline (ZOLOFT) 100 MG tablet Take 1 tablet (100 mg total) by mouth daily.  07/20/20  Yes [name], MD  clindamycin (CLEOCIN) 300 MG capsule Take 1 capsule (300 mg total) by mouth 3 (three) times daily. Patient not taking: Reported on 07/02/2017 06/17/17 07/02/17  Colon Branch, MD    Physical Exam: Vitals:   10/13/21 0200 10/13/21 0245 10/13/21 0300 10/13/21 0315  BP: 103/64 (!) 149/137 (!) 106/57 (!) 107/52  Pulse: 86 89 76 85  Resp: 19 20 (!) 24 15  Temp:      TempSrc:      SpO2: 93% 95% 97%  98%  Weight:      Height:        Physical Exam Constitutional:      General: She is not in acute distress. HENT:     Head: Normocephalic and atraumatic.     Mouth/Throat:     Mouth: Mucous membranes are dry.  Eyes:     Comments: Unable to assess pupillary reaction to light due to lack of patient cooperation  Cardiovascular:     Rate and Rhythm: Normal rate and regular rhythm.     Pulses: Normal pulses.  Pulmonary:     Effort: Pulmonary effort is normal. No respiratory distress.     Breath sounds: No wheezing or rales.  Abdominal:     General: Bowel sounds are normal. There is no distension.     Palpations: Abdomen is soft.     Tenderness: There is no abdominal tenderness.  Musculoskeletal:        General: No swelling or tenderness.     Cervical back: Normal range of motion and neck supple.  Skin:    General: Skin is warm and dry.  Neurological:     Mental Status: She is alert.     Comments: Somnolent but arousable Oriented to self only Moving all extremities on command, no focal weakness     Labs on Admission: I have personally reviewed following labs and imaging studies  CBC: Recent Labs  Lab 10/12/21 2310 10/13/21 0024 10/13/21 0029  WBC 19.7*  --   --   NEUTROABS 16.9*  --   --   HGB 12.3 13.6 13.6  HCT 39.2 40.0 40.0  MCV 97.3  --   --   PLT 154  --   --    Basic Metabolic Panel: Recent Labs  Lab 10/12/21 2310 10/13/21 0024 10/13/21 0029  NA 140 141 141  K 4.0 4.0 4.1  CL 101 98  --   CO2 30  --   --   GLUCOSE 182*  173*  --   BUN 25* 24*  --   CREATININE 1.31* 1.20*  --   CALCIUM 8.6*  --   --    GFR: Estimated Creatinine Clearance: 33.6 mL/min (A) (by C-G formula based on SCr of 1.2 mg/dL (H)). Liver Function Tests: Recent Labs  Lab 10/12/21 2310  AST 28  ALT 26  ALKPHOS 67  BILITOT 0.7  PROT 6.4*  ALBUMIN 3.1*   No results for input(s): LIPASE, AMYLASE in the last 168 hours. No results for input(s): AMMONIA in the last 168 hours. Coagulation Profile: Recent Labs  Lab 10/12/21 2310  INR 1.3*   Cardiac Enzymes: No results for input(s): CKTOTAL, CKMB, CKMBINDEX, TROPONINI in the last 168 hours. BNP (last 3 results) No results for input(s): PROBNP in the last 8760 hours. HbA1C: No results for input(s): HGBA1C in the last 72 hours. CBG: No results for input(s): GLUCAP in the last 168 hours. Lipid Profile: No results for input(s): CHOL, HDL, LDLCALC, TRIG, CHOLHDL, LDLDIRECT in the last 72 hours. Thyroid Function Tests: No results for input(s): TSH, T4TOTAL, FREET4, T3FREE, THYROIDAB in the last 72 hours. Anemia Panel: No results for input(s): VITAMINB12, FOLATE, FERRITIN, TIBC, IRON, RETICCTPCT in the last 72 hours. Urine analysis:    Component Value Date/Time   COLORURINE YELLOW 10/12/2021 2313   APPEARANCEUR CLOUDY (A) 10/12/2021 2313   LABSPEC 1.015 10/12/2021 2313   PHURINE 6.0 10/12/2021 2313   GLUCOSEU NEGATIVE 10/12/2021 2313   GLUCOSEU NEGATIVE 06/12/2014 1410   HGBUR MODERATE (  A) 10/12/2021 2313   BILIRUBINUR NEGATIVE 10/12/2021 2313   BILIRUBINUR Neg 06/19/2016 1101   KETONESUR NEGATIVE 10/12/2021 2313   PROTEINUR NEGATIVE 10/12/2021 2313   UROBILINOGEN >=8.0 06/19/2016 1101   UROBILINOGEN 0.2 05/31/2015 1835   NITRITE POSITIVE (A) 10/12/2021 2313   LEUKOCYTESUR LARGE (A) 10/12/2021 2313    Radiological Exams on Admission: I have personally reviewed images DG Chest Port 1 View  Result Date: 10/12/2021 CLINICAL DATA:  Questionable sepsis - evaluate for  abnormality EXAM: PORTABLE CHEST 1 VIEW COMPARISON:  04/21/2021 FINDINGS: Mild cardiomegaly. Aortic atherosclerosis. Right lung clear. Left lower lobe airspace opacity. No effusions or acute bony abnormality. IMPRESSION: Cardiomegaly. Left lower lobe atelectasis or infiltrate. Electronically Signed   By: Rolm Baptise M.D.   On: 10/12/2021 23:42    EKG: I have personally reviewed EKG: A-fib.  Assessment/Plan Principal Problem:   Severe sepsis (HCC) Active Problems:   Acute metabolic encephalopathy   Hypothyroidism   HLD (hyperlipidemia)   Depression   HTN (hypertension)   COPD (chronic obstructive pulmonary disease) (HCC)   Diabetes mellitus type 2 in obese (HCC)   Paroxysmal atrial fibrillation (HCC)    Assessment and Plan: * Severe sepsis ([place])- (present on admission) Secondary to UTI and possible CAP Meets criteria for severe sepsis with fever, tachycardia, leukocytosis, lactic acidosis, and acute encephalopathy.  UA with signs of infection.  Chest x-ray showing left lower lobe airspace opacity concerning for atelectasis versus infiltrate.  COVID and flu negative.  Stable on 3 L home oxygen. -Continue broad-spectrum antibiotics for now and narrow based on culture results.  Blood and urine cultures pending.  She received 500 cc fluid bolus by EMS prior to ED arrival.  Tachycardia has resolved and blood pressure currently stable.  Continue maintenance IV fluid.  Trend WBC count and lactate.  Acute metabolic encephalopathy- (present on admission) Likely due to UTI and possible pneumonia.  No focal neurodeficit, however, does have history of prior CVA/TIA and also history of subdural hematoma last year in the setting of frequent falls.  No brain imaging done in the ED. -Stat head CT.  Continue antibiotics for infection.  Check TSH, B12, and ammonia levels.  Paroxysmal atrial fibrillation ([place])- (present on admission) Currently rate controlled. -Hold Coreg at this time given severe sepsis.   She is not on anticoagulation due to history of frequent falls and subdural hematoma in August 2022.  Diabetes mellitus type 2 in obese [facility])- (present on admission) Well-controlled -A1c 6.4 in August 2022. -Repeat A1c.  Continue home long-acting insulin.  Order sliding scale insulin sensitive.  COPD (chronic obstructive pulmonary disease) ([place])- (present on admission) Chronic hypoxic respiratory failure Stable on 3 L home oxygen.  No wheezing or respiratory distress. -DuoNeb as needed  HTN (hypertension)- (present on admission) Blood pressure currently stable. -Avoid antihypertensives at this time  Depression- (present on admission) -Continue bupropion and sertraline.  HLD (hyperlipidemia) -Continue Lipitor and Zetia  Hypothyroidism- (present on admission) -Continue Synthroid and check TSH level.   DVT prophylaxis: SCDs Code Status:  DNR     Family Communication: No family available at this time. Disposition Plan: Anticipate discharge to SNF when medically stable. Consults called: None Admission status: Inpatient, Telemetry bed   Shela Leff, MD [facility] 10/13/2021, 3:43 AM

## 2021-10-13 NOTE — Plan of Care (Signed)

## 2021-10-13 NOTE — Plan of Care (Signed)
°  Problem: Health Behavior/Discharge Planning: Goal: Ability to manage health-related needs will improve Outcome: Progressing   Problem: Nutrition: Goal: Adequate nutrition will be maintained Outcome: Progressing   

## 2021-10-13 NOTE — Assessment & Plan Note (Signed)
Blood pressure currently stable. -Avoid antihypertensives at this time

## 2021-10-13 NOTE — Assessment & Plan Note (Signed)
Admit for for sepsis to med/tele bed.

## 2021-10-13 NOTE — Progress Notes (Addendum)
PROGRESS NOTE  Brief Narrative: Dawn Morrow is an 86 y.o. female with a history of dementia, 3L O2-dependent COPD, T2DM, stage IIIa CKD, PAF not on anticoagulation due to falls/SDH Aug 2022, HTN, HLD, hypothyroidism, GERD, history of CVA/TIA, depression and anxiety who presented to the ED 2/5 from SNF due to AMS, specifically appeared less conversive. She was febrile to 103F per EMS, remained febrile and tachycardic in ED with leukocytosis (19.7k) and lactic acid elevation (2.2) and grossly positive UA. Broad IV antibiotics and IV fluids were given, and she was admitted this morning for acute on chronic encephalopathy due to severe sepsis due to UTI.   Subjective: Patient confused, does not have complaints.  Objective: BP (!) 94/49    Pulse 84    Temp 99.5 F (37.5 C) (Rectal)    Resp 19    Ht 5' (1.524 m)    Wt 81.4 kg    SpO2 98%    BMI 35.05 kg/m   Gen: Elderly, obese female in no distress with fecal incontinence noted. HEENT: No tenderness/bogginess to palpation of left mastoid. No proptosis or nystagmus on exam that's limited by pt cooperation. Left ear canal with dark cerumen and superiorly oriented pearl without canal irritation or injection or otorrhea.  Pulm: Clear and nonlabored. Minimal consolidation at left based with static air bronchograms on bedside U/S. Good air movement without wheezes or crackles.  CV: Irreg w/rate in 70's, no murmur, no JVD, no edema GI: Soft, NT, ND, +BS  Neuro: Alert, not oriented, follows commands if very persistently directed, otherwise difficult exam. Moves all extremities. Skin: No rashes, lesions or ulcers  Assessment & Plan: Principal Problem:   Severe sepsis ([place]) Active Problems:   Hypothyroidism   HLD (hyperlipidemia)   Depression   HTN (hypertension)   COPD (chronic obstructive pulmonary disease) (HCC)   Diabetes mellitus type 2 in obese (HCC)   Acute metabolic encephalopathy   Paroxysmal atrial fibrillation (HCC)  Acute metabolic  encephalopathy on chronic dementia due to severe sepsis due to UTI:  - Continue broad spectrum antibiotics pending urine and blood culture results given severity of presentation. WBC 19.7k > 19.9k. Anticipate narrowing in next 24 hours.  - Received IVF bolus. BP low-stable, tachycardia improved. Continue IVF pending oral intake today.  - Delirium precautions. Due to hx CVA and SDH/falls, CT head performed showed no acute changes, no ICH/SDH. TSH checked with hx hypothyroidism is 3.837, B11 wnl, ammonia 17.    LLL opacity on CXR: Without pulmonary symptoms and no change to chronic hypoxia, favor this to represent atelectasis. Also has consolidative pattern without dynamic air bronchograms on bedside POCUS. - Incentive spirometry - Monitor on antibiotics as above - Continue 3L O2 baseline, prn duonebs. No exacerbation of COPD currently.   Cholesteatoma: Opacification of mastoid air cells without bony breakdown on CT. No mastoid tenderness on exam. White pearl visible beyond dark cerumen posteriorly on otoscopic exam.  - Discussed with ENT, Dr. Marcelline Deist. Needs ENT follow up, no urgent evaluation required. This is not a cause of the patient's fever.   Normocytic anemia, thrombocytopenia: Revealed with hemodilution. No active bleeding noted on full exam. Will continue to hold anticoagulation and trend this PM to confirm stability.   Patrecia Pour, MD Pager on amion 10/13/2021, 11:30 AM

## 2021-10-13 NOTE — Progress Notes (Signed)
Pharmacy Antibiotic Note  Dawn Morrow is a 86 y.o. female admitted on 10/12/2021 with sepsis.  Pharmacy has been consulted for Vancomycin/Aztreonam  dosing.  Plan: Aztreonam 1 gram iv Q 8 hours Vancomycin 1500 mg iv Q 48 hours (AUC of 503 using Scr of 1.2)  Follow up cultures, Scr, progress  Height: 5' (152.4 cm) Weight: 81.4 kg (179 lb 7.3 oz) IBW/kg (Calculated) : 45.5  Temp (24hrs), Avg:102 F (38.9 C), Min:102 F (38.9 C), Max:102 F (38.9 C)  Recent Labs  Lab 10/12/21 2310 10/13/21 0024  WBC 19.7*  --   CREATININE 1.31* 1.20*  LATICACIDVEN 2.2*  --     Estimated Creatinine Clearance: 33.6 mL/min (A) (by C-G formula based on SCr of 1.2 mg/dL (H)).    Allergies  Allergen Reactions   Penicillins Anaphylaxis   Penicillins Anaphylaxis    Per duplicate chart   Codeine Other (See Comments)    Listed on MAR   Codeine Other (See Comments)    Unknown reaction   Sulfa Antibiotics Other (See Comments)    Unknown reaction   Sulfonylureas Other (See Comments)    Listed on MAR   Sulfonylureas Other (See Comments)    Unknown reaction   Sulfonamide Derivatives Other (See Comments)    Unknown reaction    Thank you for allowing pharmacy to be a part of this patients care.  Tad Moore 10/13/2021 3:45 AM

## 2021-10-13 NOTE — Subjective & Objective (Addendum)
Patient is a 86 year old female with a past medical history of dementia, COPD on 3 L home oxygen, type II diabetes, neuropathy, CKD stage IIIa, paroxysmal A. fib not on anticoagulation due to history of frequent falls and subdural hematoma in August 2022, history of DVT/PE, GERD, hypertension, hyperlipidemia, hypothyroidism, CVA/TIA, depression, anxiety presenting to the ED via EMS for evaluation of altered mental status.  Temperature 103 F with EMS and was given Tylenol and 500 cc fluid bolus.  Staff at her facility reported that she has dementia but normally talks, today she was less conversive.  In the ED, febrile and tachycardic.  Labs showing WBC 19.7.  Lactic acid 2.2.  UA with positive nitrite, large amount of leukocytes, greater than 50 WBCs, and many bacteria.  Urine culture pending.  COVID and flu negative.  Chest x-ray showing left lower lobe airspace opacity concerning for atelectasis versus infiltrate.  No brain imaging done. Blood cultures drawn and she was given vancomycin, Flagyl, and aztreonam.  Patient is confused and not able to give any history.

## 2021-10-14 DIAGNOSIS — E039 Hypothyroidism, unspecified: Secondary | ICD-10-CM

## 2021-10-14 DIAGNOSIS — J9811 Atelectasis: Secondary | ICD-10-CM

## 2021-10-14 DIAGNOSIS — H7190 Unspecified cholesteatoma, unspecified ear: Secondary | ICD-10-CM

## 2021-10-14 DIAGNOSIS — I10 Essential (primary) hypertension: Secondary | ICD-10-CM

## 2021-10-14 DIAGNOSIS — I480 Paroxysmal atrial fibrillation: Secondary | ICD-10-CM

## 2021-10-14 LAB — CBC
HCT: 37.2 % (ref 36.0–46.0)
Hemoglobin: 11.8 g/dL — ABNORMAL LOW (ref 12.0–15.0)
MCH: 30.6 pg (ref 26.0–34.0)
MCHC: 31.7 g/dL (ref 30.0–36.0)
MCV: 96.6 fL (ref 80.0–100.0)
Platelets: 127 10*3/uL — ABNORMAL LOW (ref 150–400)
RBC: 3.85 MIL/uL — ABNORMAL LOW (ref 3.87–5.11)
RDW: 15.7 % — ABNORMAL HIGH (ref 11.5–15.5)
WBC: 15.8 10*3/uL — ABNORMAL HIGH (ref 4.0–10.5)
nRBC: 0 % (ref 0.0–0.2)

## 2021-10-14 LAB — GLUCOSE, CAPILLARY
Glucose-Capillary: 123 mg/dL — ABNORMAL HIGH (ref 70–99)
Glucose-Capillary: 137 mg/dL — ABNORMAL HIGH (ref 70–99)
Glucose-Capillary: 84 mg/dL (ref 70–99)
Glucose-Capillary: 114 mg/dL — ABNORMAL HIGH (ref 70–99)

## 2021-10-14 LAB — BASIC METABOLIC PANEL
Anion gap: 9 (ref 5–15)
BUN: 21 mg/dL (ref 8–23)
CO2: 25 mmol/L (ref 22–32)
Calcium: 8.4 mg/dL — ABNORMAL LOW (ref 8.9–10.3)
Chloride: 104 mmol/L (ref 98–111)
Creatinine, Ser: 0.98 mg/dL (ref 0.44–1.00)
GFR, Estimated: 57 mL/min — ABNORMAL LOW (ref >60)
Glucose, Bld: 129 mg/dL — ABNORMAL HIGH (ref 70–99)
Potassium: 3.8 mmol/L (ref 3.5–5.1)
Sodium: 138 mmol/L (ref 135–145)

## 2021-10-14 MED ORDER — LEVOFLOXACIN IN D5W 500 MG/100ML IV SOLN
500.0000 mg | Freq: Once | INTRAVENOUS | Status: AC
  Administered 2021-10-14: 500 mg via INTRAVENOUS
  Filled 2021-10-14: qty 100

## 2021-10-14 MED ORDER — LEVOFLOXACIN 500 MG PO TABS
500.0000 mg | ORAL_TABLET | Freq: Every day | ORAL | Status: DC
  Administered 2021-10-15: 500 mg via ORAL
  Filled 2021-10-14: qty 1

## 2021-10-14 MED ORDER — CARVEDILOL 12.5 MG PO TABS
12.5000 mg | ORAL_TABLET | Freq: Two times a day (BID) | ORAL | Status: DC
  Administered 2021-10-14 – 2021-10-15 (×3): 12.5 mg via ORAL
  Filled 2021-10-14 (×3): qty 1

## 2021-10-14 NOTE — Assessment & Plan Note (Signed)
LLL opacity on CXR: Without pulmonary symptoms and no change to chronic hypoxia, favor this to represent atelectasis. Also has consolidative pattern without dynamic air bronchograms on bedside POCUS. - Incentive spirometry - Monitor on antibiotics as above - Continue 3L O2 baseline, prn duonebs. No exacerbation of COPD currently.

## 2021-10-14 NOTE — Assessment & Plan Note (Signed)
Opacification of mastoid air cells without bony breakdown on CT. No mastoid tenderness on exam. White pearl visible beyond dark cerumen posteriorly on otoscopic exam.  - Discussed with ENT, Dr. Marcelline Deist. Needs ENT follow up, no urgent evaluation required. This is not a cause of the patient's fever.

## 2021-10-14 NOTE — Plan of Care (Signed)
  Problem: Urinary Elimination: Goal: Signs and symptoms of infection will decrease Outcome: Progressing   

## 2021-10-14 NOTE — Hospital Course (Signed)
Dawn Morrow is an 86 y.o. female with a history of dementia, 3L O2-dependent COPD, T2DM, stage IIIa CKD, PAF not on anticoagulation due to falls/SDH Aug 2022, HTN, HLD, hypothyroidism, GERD, history of CVA/TIA, depression and anxiety who presented to the ED 2/5 from SNF due to AMS, specifically appeared less conversive. She was febrile to 103F per EMS, remained febrile and tachycardic in ED with leukocytosis (19.7k) and lactic acid elevation (2.2) and grossly positive UA. Broad IV antibiotics and IV fluids were given, and she was admitted for acute on chronic encephalopathy due to severe sepsis due to UTI. Urine cultures have grown E. coli and Klebsiella for which antibiotics were narrowed to levaquin. Mental status has improved.

## 2021-10-14 NOTE — Plan of Care (Signed)
Problem: Clinical Measurements: °Goal: Diagnostic test results will improve °Outcome: Completed/Met °  °

## 2021-10-14 NOTE — Plan of Care (Signed)
  Problem: Clinical Measurements: Goal: Ability to maintain clinical measurements within normal limits will improve Outcome: Progressing   Problem: Nutrition: Goal: Adequate nutrition will be maintained Outcome: Progressing   Problem: Safety: Goal: Ability to remain free from injury will improve Outcome: Progressing   

## 2021-10-14 NOTE — Progress Notes (Signed)
Progress Note  Patient: Dawn Morrow DOB: 1936/05/01  DOA: 10/12/2021  DOS: 10/14/2021    Brief hospital course: Dawn Morrow is an 86 y.o. female with a history of dementia, 3L O2-dependent COPD, T2DM, stage IIIa CKD, PAF not on anticoagulation due to falls/SDH Aug 2022, HTN, HLD, hypothyroidism, GERD, history of CVA/TIA, depression and anxiety who presented to the ED 2/5 from SNF due to AMS, specifically appeared less conversive. She was febrile to 103F per EMS, remained febrile and tachycardic in ED with leukocytosis (19.7k) and lactic acid elevation (2.2) and grossly positive UA. Broad IV antibiotics and IV fluids were given, and she was admitted for acute on chronic encephalopathy due to severe sepsis due to UTI. Urine cultures have grown E. coli and Klebsiella for which antibiotics were narrowed to levaquin. Mental status has improved.  Assessment and Plan: * Severe sepsis ([place])- (present on admission) Secondary to UTI    Cholesteatoma Opacification of mastoid air cells without bony breakdown on CT. No mastoid tenderness on exam. White pearl visible beyond dark cerumen posteriorly on otoscopic exam.  - Discussed with ENT, Dr. Marcelline Deist. Needs ENT follow up, no urgent evaluation required. This is not a cause of the patient's fever.   Atelectasis LLL opacity on CXR: Without pulmonary symptoms and no change to chronic hypoxia, favor this to represent atelectasis. Also has consolidative pattern without dynamic air bronchograms on bedside POCUS. - Incentive spirometry - Monitor on antibiotics as above - Continue 3L O2 baseline, prn duonebs. No exacerbation of COPD currently.   Paroxysmal atrial fibrillation ([place])- (present on admission) Restart coreg She is not on anticoagulation due to history of frequent falls and subdural hematoma in August 2022.  Acute metabolic encephalopathy- (present on admission) Acute metabolic encephalopathy on chronic dementia due to severe sepsis  due to UTI: Due to hx CVA and SDH/falls, CT head performed showed no acute changes, no ICH/SDH. TSH checked with hx hypothyroidism is 3.837, B11 wnl, ammonia 17.  - Continue antibiotics pending urine and blood culture  - Delirium precautions.    Diabetes mellitus type 2 in obese [facility])- (present on admission)  Continue home long-acting insulin.  Order sliding scale insulin sensitive.  COPD (chronic obstructive pulmonary disease) ([place])- (present on admission) Chronic hypoxic respiratory failure Stable on 3 L home oxygen.  No wheezing or respiratory distress. -DuoNeb as needed  HTN (hypertension)- (present on admission) Blood pressure currently stable. -Avoid antihypertensives at this time  Depression- (present on admission) -Continue bupropion and sertraline.  HLD (hyperlipidemia) -Continue Lipitor and Zetia  Hypothyroidism- (present on admission) -Continue Synthroid    Obesity: Estimated body mass index is 35.05 kg/m as calculated from the following:   Height as of this encounter: 5' (1.524 m).   Weight as of this encounter: 81.4 kg.   Subjective: More alert, conversant, confused. Reports feeling fine, no dyspnea or pain anywhere.   Objective: Vitals:   10/13/21 1623 10/13/21 2100 10/14/21 0507 10/14/21 0805  BP: 125/76 (!) 147/83 (!) 115/91 120/61  Pulse: 68 98 (!) 106 (!) 110  Resp: 16 17 17 18   Temp: 98.5 F (36.9 C) 98.4 F (36.9 C) 98.4 F (36.9 C) 98.4 F (36.9 C)  TempSrc: Oral   Oral  SpO2: 91% 91% 91% 92%  Weight:      Height:       Gen: Elderly female in no distress Pulm: Nonlabored breathing room air. Clear throughout CV: Regular rate and rhythm. No murmur, rub, or gallop. No JVD, no pitting  dependent edema. GI: Abdomen soft, +suprapubic tenderness, non-distended, with normoactive bowel sounds.  Ext: Warm, no deformities Skin: No rashes, lesions or ulcers on visualized skin. Neuro: Alert and not oriented without focal neurological deficits. Psych:  Judgement and insight appear impaired. Mood euthymic & affect congruent.    Data Personally reviewed: WBC 19.9 > 22.4 > 15.8, plt 124 > 131 > 127 SCr 1.31 > 1.20 > 0.98 Ammonia, TSH, B12 wnl.  +UA, urine culture Klebsiella, E. coli, susceptibilities pending.  SARS-CoV-2 PCR (2/6): Negative Influenza A/B: Negative  Family Communication: None at bedside  Disposition: Status is: Inpatient Remains inpatient appropriate because: Resolving sepsis, anticipate discharge back to SNF 2/8 if stabilized. Planned Discharge Destination: Skilled nursing facility  Patrecia Pour, MD 10/14/2021 2:43 PM Page by [email or web address]

## 2021-10-15 LAB — GLUCOSE, CAPILLARY
Glucose-Capillary: 107 mg/dL — ABNORMAL HIGH (ref 70–99)
Glucose-Capillary: 102 mg/dL — ABNORMAL HIGH (ref 70–99)
Glucose-Capillary: 100 mg/dL — ABNORMAL HIGH (ref 70–99)

## 2021-10-15 LAB — CBC
HCT: 37.9 % (ref 36.0–46.0)
Hemoglobin: 11.8 g/dL — ABNORMAL LOW (ref 12.0–15.0)
MCH: 29.9 pg (ref 26.0–34.0)
MCHC: 31.1 g/dL (ref 30.0–36.0)
MCV: 96.2 fL (ref 80.0–100.0)
Platelets: 143 10*3/uL — ABNORMAL LOW (ref 150–400)
RBC: 3.94 MIL/uL (ref 3.87–5.11)
RDW: 15.9 % — ABNORMAL HIGH (ref 11.5–15.5)
WBC: 13.8 10*3/uL — ABNORMAL HIGH (ref 4.0–10.5)
nRBC: 0 % (ref 0.0–0.2)

## 2021-10-15 MED ORDER — LEVOFLOXACIN 500 MG PO TABS: 500.0000 mg | ORAL_TABLET | Freq: Every day | ORAL | 0 refills | Status: AC

## 2021-10-15 MED ORDER — ONDANSETRON HCL 4 MG/2ML IJ SOLN
4.0000 mg | Freq: Once | INTRAMUSCULAR | Status: AC
  Administered 2021-10-15: 4 mg via INTRAVENOUS
  Filled 2021-10-15: qty 2

## 2021-10-15 NOTE — NC FL2 (Signed)
[place] LEVEL OF CARE SCREENING TOOL     IDENTIFICATION  Patient Name: Dawn Morrow Birthdate: 02-10-1936 Sex: female Admission Date (Current Location): 10/12/2021  [facility] and [place] Number:  Herbalist and Address:         Provider Number: [id number]  Attending Physician Name and Address:  Rodena Goldmann, DO  Relative Name and Phone Number:  Merrilee Jansky (Niece)   [phone]    Current Level of Care: Hospital Recommended Level of Care: [place] Prior Approval Number:    Date Approved/Denied:   PASRR Number: [id number]  Discharge Plan: SNF    Current Diagnoses: Patient Active Problem List   Diagnosis Date Noted   Atelectasis 10/14/2021   Cholesteatoma 10/14/2021   Severe sepsis ([place]) 10/13/2021   Paroxysmal atrial fibrillation ([place]) 10/13/2021   C1 cervical fracture ([place]) 04/21/2021   SDH (subdural hematoma)    UTI (urinary tract infection) 07/31/2020   CAP (community acquired pneumonia) 07/31/2020   Pressure injury of skin 07/31/2020   Malnutrition of moderate degree 07/19/2020   Adjustment disorder with depressed mood 07/13/2020   Dehydration 07/12/2020   Fall 01/60/1093   Acute metabolic encephalopathy 23/55/7322   AMS (altered mental status) 03/26/2020   History of pulmonary embolism 03/26/2020   History of DVT (deep vein thrombosis) 03/26/2020   Atrial fibrillation, chronic ([place]) 03/26/2020   Acute encephalopathy 12/14/2019   COVID-19 virus infection 10/02/2019   Acute lower UTI 10/02/2019   AKI (acute kidney injury) ([place]) 10/01/2019   Syncope 09/18/2017   Near syncope 09/17/2017   Diabetes mellitus type 2 in obese ([place]) 09/17/2017   Atrial fibrillation with RVR ([place]) 09/17/2017   Hypomagnesemia 09/17/2017   CKD (chronic kidney disease), stage III ([place]) 09/17/2017   Forehead contusion    MGUS (monoclonal gammopathy of unknown significance) 08/13/2016   Renal failure 07/09/2015   Follow-up  ---------PCP NOTES 05/17/2015   Essential tremor 12/28/2014   Ulnar neuropathy of left upper extremity 12/28/2014   Intractable pain 06/21/2014   Back pain 06/21/2014   Lumbar radiculopathy 06/21/2014   Encounter for therapeutic drug monitoring 03/14/2014   Numbness 10/18/2013   TIA (transient ischemic attack) 09/19/2013   Weakness 09/18/2013   Annual physical exam 05/30/2012   Tremor 01/21/2012   Failure to thrive and poor med compliance  01/21/2012   Pulmonary embolism ([place]) 11/18/2010   DVT (deep venous thrombosis) ([place]) 11/18/2010   Long term current use of anticoagulant 11/18/2010   ABNORMAL ELECTROCARDIOGRAM 11/10/2010   OSTEOARTHRITIS 08/06/2010   DIZZINESS 04/15/2010   Diabetes ([place]) 06/04/2009   ACNE ROSACEA 11/28/2008   BACK PAIN 05/16/2008   HIP PAIN, RIGHT, CHRONIC 09/15/2007   Hypothyroidism 09/13/2007   HLD (hyperlipidemia) 09/13/2007   Osteoporosis 07/22/2007   Depression 09/11/2006   HTN (hypertension) 09/11/2006   ASTHMA 09/11/2006   COPD (chronic obstructive pulmonary disease) ([place]) 09/11/2006   GERD 09/11/2006   Recurrent UTI 09/11/2006   [place]EN[place] FILTER INSERTION, HX OF 09/11/2006    Orientation RESPIRATION BLADDER Height & Weight     Self  O2 (nasal cannula 3 L) Incontinent Weight: 179 lb 7.3 oz (81.4 kg) Height:  5' (152.4 cm)  BEHAVIORAL SYMPTOMS/MOOD NEUROLOGICAL BOWEL NUTRITION STATUS      Continent Diet (see d/c summary)  AMBULATORY STATUS COMMUNICATION OF NEEDS Skin   Extensive Assist Verbally Normal                       Personal Care Assistance Level of  Assistance  Bathing, Dressing, Feeding Bathing Assistance: Limited assistance Feeding assistance: Limited assistance Dressing Assistance: Limited assistance     Functional Limitations Info  Hearing, Sight, Speech Sight Info: Impaired Hearing Info: Impaired      SPECIAL CARE FACTORS FREQUENCY                       Contractures Contractures Info: Not present     Additional Factors Info  Code Status, Allergies, Insulin Sliding Scale Code Status Info: DNR Allergies Info: Penicillins   Penicillins   Codeine   Codeine   Sulfa Antibiotics   Sulfonylureas   Sulfonylureas   Sulfonamide Derivatives   Insulin Sliding Scale Info: see d/c  [place]       Current Medications (10/15/2021):  This is the current hospital active medication list Current Facility-Administered Medications  Medication Dose Route Frequency Provider Last Rate Last Admin   acetaminophen (TYLENOL) tablet 650 mg  650 mg Oral Q6H PRN Shela Leff, MD       Or   acetaminophen (TYLENOL) suppository 650 mg  650 mg Rectal Q6H PRN Shela Leff, MD       atorvastatin (LIPITOR) tablet 10 mg  10 mg Oral QHS Shela Leff, MD   10 mg at 10/14/21 2104   buPROPion (WELLBUTRIN XL) 24 hr tablet 300 mg  300 mg Oral Daily Shela Leff, MD   300 mg at 10/15/21 1059   carvedilol (COREG) tablet 12.5 mg  12.5 mg Oral BID WC Vance Gather B, MD   12.5 mg at 10/15/21 0557   ezetimibe (ZETIA) tablet 10 mg  10 mg Oral Daily Shela Leff, MD   10 mg at 10/15/21 1059   gabapentin (NEURONTIN) capsule 300 mg  300 mg Oral BID WC Shela Leff, MD   300 mg at 10/15/21 1100   And   gabapentin (NEURONTIN) capsule 600 mg  600 mg Oral QHS Shela Leff, MD   600 mg at 10/14/21 2104   insulin aspart (novoLOG) injection 0-5 Units  0-5 Units Subcutaneous QHS Shela Leff, MD       insulin aspart (novoLOG) injection 0-9 Units  0-9 Units Subcutaneous TID WC Shela Leff, MD   2 Units at 10/14/21 1245   insulin glargine-yfgn (SEMGLEE) injection 5 Units  5 Units Subcutaneous QHS Shela Leff, MD   5 Units at 10/14/21 2105   ipratropium-albuterol (DUONEB) 0.5-2.5 (3) MG/3ML nebulizer solution 3 mL  3 mL Nebulization Q6H PRN Shela Leff, MD       levofloxacin (LEVAQUIN) tablet 500 mg  500 mg Oral Daily Vance Gather B, MD   500 mg at 10/15/21 1059   levothyroxine  (SYNTHROID) tablet 88 mcg  88 mcg Oral Q0600 Shela Leff, MD   88 mcg at 10/15/21 0555   sertraline (ZOLOFT) tablet 100 mg  100 mg Oral Daily Shela Leff, MD   100 mg at 10/15/21 1059     Discharge Medications: Please see discharge summary for a list of discharge medications.  Relevant Imaging Results:  Relevant Lab Results:   Additional Information SS#[id number], [place]

## 2021-10-15 NOTE — TOC Progression Note (Signed)
Transition of Care [facility]) - Initial/Assessment Note    Patient Details  Name: Dawn Morrow MRN: [id number] Date of Birth: Sep 12, 1935  [facility] CM/SW Contact:    Milinda Antis, [place] Phone Number: 10/15/2021, 10:46 AM  Clinical Narrative:                 CSW contacted Janie with admissions at [facility] to inquire about what is needed for the patient's return and is awaiting a returned call.          Patient Goals and CMS Choice        Expected Discharge Plan and Services           Expected Discharge Date: 10/15/21                                    Prior Living Arrangements/Services                       Activities of Daily Living      Permission Sought/Granted                  Emotional Assessment              Admission diagnosis:  Acute cystitis with hematuria [N30.01] Sepsis ([place]) [A41.9] Sepsis with acute renal failure without septic shock, due to unspecified organism, unspecified acute renal failure type ([place]) [A41.9, R65.20, N17.9] Patient Active Problem List   Diagnosis Date Noted   Atelectasis 10/14/2021   Cholesteatoma 10/14/2021   Severe sepsis ([place]) 10/13/2021   Paroxysmal atrial fibrillation ([place]) 10/13/2021   C1 cervical fracture ([place]) 04/21/2021   SDH (subdural hematoma)    UTI (urinary tract infection) 07/31/2020   CAP (community acquired pneumonia) 07/31/2020   Pressure injury of skin 07/31/2020   Malnutrition of moderate degree 07/19/2020   Adjustment disorder with depressed mood 07/13/2020   Dehydration 07/12/2020   Fall 97/35/3299   Acute metabolic encephalopathy 24/26/8341   AMS (altered mental status) 03/26/2020   History of pulmonary embolism 03/26/2020   History of DVT (deep vein thrombosis) 03/26/2020   Atrial fibrillation, chronic ([place]) 03/26/2020   Acute encephalopathy 12/14/2019   COVID-19 virus infection 10/02/2019   Acute lower UTI 10/02/2019   AKI (acute kidney injury) ([place])  10/01/2019   Syncope 09/18/2017   Near syncope 09/17/2017   Diabetes mellitus type 2 in obese ([employer]) 09/17/2017   Atrial fibrillation with RVR ([place]) 09/17/2017   Hypomagnesemia 09/17/2017   CKD (chronic kidney disease), stage III ([place]) 09/17/2017   Forehead contusion    MGUS (monoclonal gammopathy of unknown significance) 08/13/2016   Renal failure 07/09/2015   Follow-up ---------PCP NOTES 05/17/2015   Essential tremor 12/28/2014   Ulnar neuropathy of left upper extremity 12/28/2014   Intractable pain 06/21/2014   Back pain 06/21/2014   Lumbar radiculopathy 06/21/2014   Encounter for therapeutic drug monitoring 03/14/2014   Numbness 10/18/2013   TIA (transient ischemic attack) 09/19/2013   Weakness 09/18/2013   Annual physical exam 05/30/2012   Tremor 01/21/2012   Failure to thrive and poor med compliance  01/21/2012   Pulmonary embolism ([place]) 11/18/2010   DVT (deep venous thrombosis) ([place]) 11/18/2010   Long term current use of anticoagulant 11/18/2010   ABNORMAL ELECTROCARDIOGRAM 11/10/2010   OSTEOARTHRITIS 08/06/2010   DIZZINESS 04/15/2010   Diabetes ([place]) 06/04/2009   ACNE ROSACEA 11/28/2008   BACK PAIN 05/16/2008  HIP PAIN, RIGHT, CHRONIC 09/15/2007   Hypothyroidism 09/13/2007   HLD (hyperlipidemia) 09/13/2007   Osteoporosis 07/22/2007   Depression 09/11/2006   HTN (hypertension) 09/11/2006   ASTHMA 09/11/2006   COPD (chronic obstructive pulmonary disease) ([place]) 09/11/2006   GERD 09/11/2006   Recurrent UTI 09/11/2006   [place] FILTER INSERTION, HX OF 09/11/2006   PCP:  Wenda Low, MD Pharmacy:   [place], [place] - [address] [place] [address] Phone: [phone] Fax: [phone]     Social Determinants of Health (SDOH) Interventions    Readmission Risk Interventions Readmission Risk Prevention Plan 04/22/2021 10/03/2019  Transportation Screening Complete Complete   PCP or Specialist Appt within 3-5 Days - Complete  HRI or [place] - Not Complete  HRI or Home Care Consult comments - Patient at her baseline  Social Work Consult for [place] Planning/Counseling - [place] - Not Applicable  Medication Review Press photographer) Complete Referral to Pharmacy  PCP or Specialist appointment within 3-5 days of discharge Complete -  [place] or Home Care Consult Complete -  SW Recovery Care/Counseling Consult Complete -  Palliative Care Screening Not Applicable -  Skilled Nursing Facility Complete -  Some recent data might be hidden

## 2021-10-15 NOTE — Discharge Summary (Signed)
Physician Discharge Summary  Dawn Morrow FGH:[id number] DOB: [birth year] DOA: 10/12/2021  PCP: Wenda Low, MD  Admit date: 10/12/2021  Discharge date: 10/15/2021  Admitted From:SNF  Disposition:  SNF  Recommendations for Outpatient Follow-up:  Follow up with PCP in 1-2 weeks Continue on Levaquin for 3 more days to complete total 5-day course for UTI as well as possible pneumonia Continue on Coreg for PAF and not on anticoagulation due to history of frequent falls and subdural hematoma Follow-up with the ENT for cholesteatoma in the near future Continue other home medications as noted below  Home Health: None  Equipment/Devices: Has home 3 L nasal cannula at baseline  Discharge Condition:Stable  CODE STATUS: DNR  Diet recommendation: Heart Healthy/carb modified  Brief/Interim Summary: Dawn Morrow is an 86 y.o. female with a history of dementia, 3L O2-dependent COPD, T2DM, stage IIIa CKD, PAF not on anticoagulation due to falls/SDH Aug 2022, HTN, HLD, hypothyroidism, GERD, history of CVA/TIA, depression and anxiety who presented to the ED 2/5 from SNF due to AMS, specifically appeared less conversive. She was febrile to 103F per EMS, remained febrile and tachycardic in ED with leukocytosis (19.7k) and lactic acid elevation (2.2) and grossly positive UA. Broad IV antibiotics and IV fluids were given, and she was admitted for acute on chronic encephalopathy due to severe sepsis due to UTI. Urine cultures have grown E. coli and Klebsiella for which antibiotics were narrowed to levaquin. Mental status has improved.  See has been transitioned to oral Levaquin which she is tolerating well and is overall stable for discharge back to SNF.  She was also noted to have a cholesteatoma on CT scan and this was discussed with ENT, Dr. Marcelline Deist who states that she may follow-up outpatient for further evaluation.  No urgent evaluation otherwise required.  She is currently on her baseline 3 L nasal cannula  oxygen and has had no other acute events during the course of this admission.  Discharge Diagnoses:  Principal Problem:   Severe sepsis ([place]) Active Problems:   Hypothyroidism   HLD (hyperlipidemia)   Depression   HTN (hypertension)   COPD (chronic obstructive pulmonary disease) (HCC)   Diabetes mellitus type 2 in obese (HCC)   Acute metabolic encephalopathy   Paroxysmal atrial fibrillation (HCC)   Atelectasis   Cholesteatoma  Principal discharge diagnosis: Severe sepsis secondary to E. coli and Klebsiella UTI.  Discharge Instructions  Discharge Instructions     Diet - low sodium heart healthy   Complete by: As directed    Increase activity slowly   Complete by: As directed       Allergies as of 10/15/2021       Reactions   Penicillins Anaphylaxis   Penicillins Anaphylaxis   Per duplicate chart   Codeine Other (See Comments)   Listed on MAR   Codeine Other (See Comments)   Unknown reaction   Sulfa Antibiotics Other (See Comments)   Unknown reaction   Sulfonylureas Other (See Comments)   Listed on MAR   Sulfonylureas Other (See Comments)   Unknown reaction   Sulfonamide Derivatives Other (See Comments)   Unknown reaction        Medication List     TAKE these medications    acetaminophen 500 MG tablet Commonly known as: TYLENOL Take 500 mg by mouth every 4 (four) hours as needed for mild pain.   albuterol 108 (90 Base) MCG/ACT inhaler Commonly known as: VENTOLIN HFA Inhale 2 puffs into the lungs every 6 (six)  hours as needed for wheezing or shortness of breath.   atorvastatin 10 MG tablet Commonly known as: LIPITOR Take 1 tablet (10 mg total) by mouth at bedtime.   Basaglar KwikPen 100 UNIT/ML Inject 5 Units into the skin at bedtime.   buPROPion 300 MG 24 hr tablet Commonly known as: WELLBUTRIN XL Take 1 tablet (300 mg total) by mouth daily.   carvedilol 12.5 MG tablet Commonly known as: COREG Take 1 tablet (12.5 mg total) by mouth 2 (two)  times daily with a meal.   docusate sodium 100 MG capsule Commonly known as: COLACE Take 100 mg by mouth 2 (two) times daily.   ezetimibe 10 MG tablet Commonly known as: ZETIA Take 10 mg by mouth daily.   gabapentin 300 MG capsule Commonly known as: NEURONTIN Take 1 cap in AM, 1 cap at noon, 2 caps at bedtime What changed:  how much to take how to take this when to take this additional instructions   ketoconazole 2 % shampoo Commonly known as: NIZORAL Apply 1 application topically See admin instructions. Use topically to shampoo hair twice weekly - Tuesday and Friday   levofloxacin 500 MG tablet Commonly known as: LEVAQUIN Take 1 tablet (500 mg total) by mouth daily for 3 days.   levothyroxine 100 MCG tablet Commonly known as: SYNTHROID Take 88 mcg by mouth daily at 6 PM.   meclizine 25 MG tablet Commonly known as: ANTIVERT Take 25 mg by mouth at bedtime as needed for dizziness.   multivitamin with minerals Tabs tablet Take 1 tablet by mouth daily.   nystatin powder Commonly known as: MYCOSTATIN/NYSTOP Apply 1 application topically 2 (two) times daily.   ondansetron 4 MG tablet Commonly known as: ZOFRAN Take 4 mg by mouth every 8 (eight) hours as needed for nausea or vomiting.   OXYGEN Inhale 3 L into the lungs continuous.   polyethylene glycol 17 g packet Commonly known as: MIRALAX / GLYCOLAX Take 17 g by mouth daily as needed for mild constipation or moderate constipation.   Propylene Glycol 0.6 % Soln Place 1 drop into both eyes 4 (four) times daily as needed (for dry eyes).   sertraline 100 MG tablet Commonly known as: ZOLOFT Take 1 tablet (100 mg total) by mouth daily.   Vitamin D 50 MCG (2000 UT) tablet Take 2,000 Units by mouth daily.        Follow-up Information     Wenda Low, MD. Schedule an appointment as soon as possible for a visit in 1 week(s).   Specialty: Internal Medicine Contact information: [address] [place] [address] [phone]                Allergies  Allergen Reactions   Penicillins Anaphylaxis   Penicillins Anaphylaxis    Per duplicate chart   Codeine Other (See Comments)    Listed on MAR   Codeine Other (See Comments)    Unknown reaction   Sulfa Antibiotics Other (See Comments)    Unknown reaction   Sulfonylureas Other (See Comments)    Listed on MAR   Sulfonylureas Other (See Comments)    Unknown reaction   Sulfonamide Derivatives Other (See Comments)    Unknown reaction    Consultations: Discussed with the ENT   Procedures/Studies: CT HEAD WO CONTRAST (5MM)  Result Date: 10/13/2021 CLINICAL DATA:  Delirium, fever. EXAM: CT HEAD WITHOUT CONTRAST TECHNIQUE: Contiguous axial images were obtained from the base of the skull through the vertex without  intravenous contrast. RADIATION DOSE REDUCTION: This exam was performed according to the departmental dose-optimization program which includes automated exposure control, adjustment of the mA and/or kV according to patient size and/or use of iterative reconstruction technique. COMPARISON:  09/07/2021. FINDINGS: Brain: No acute intracranial hemorrhage, midline shift or mass effect. No extra-axial fluid collection. Diffuse atrophy is noted. Subcortical and periventricular white matter hypodensities are noted bilaterally. Old lacunar infarcts are present in the basal ganglia bilaterally. No hydrocephalus. Vascular: No hyperdense vessel or unexpected calcification. Skull: Normal. Negative for fracture or focal lesion. Sinuses/Orbits: No acute finding. Other: There is complete opacification of the mastoid air cells and middle ear on the left. IMPRESSION: 1. No acute intracranial process. 2. Atrophy with chronic microvascular ischemic changes. 3. Opacification of the mastoid air cells and middle ear on the left, possible otomastoiditis versus cholesteatoma. Electronically Signed   By: Brett Fairy M.D.   On: 10/13/2021  04:16   DG Chest Port 1 View  Result Date: 10/12/2021 CLINICAL DATA:  Questionable sepsis - evaluate for abnormality EXAM: PORTABLE CHEST 1 VIEW COMPARISON:  04/21/2021 FINDINGS: Mild cardiomegaly. Aortic atherosclerosis. Right lung clear. Left lower lobe airspace opacity. No effusions or acute bony abnormality. IMPRESSION: Cardiomegaly. Left lower lobe atelectasis or infiltrate. Electronically Signed   By: Rolm Baptise M.D.   On: 10/12/2021 23:42     Discharge Exam: Vitals:   10/15/21 0508 10/15/21 0913  BP: (!) 129/91 135/83  Pulse: 77 (!) 105  Resp: 19 20  Temp: 98.4 F (36.9 C) 98 F (36.7 C)  SpO2: 90% 93%   Vitals:   10/14/21 1653 10/14/21 2054 10/15/21 0508 10/15/21 0913  BP: (!) 148/83 140/71 (!) 129/91 135/83  Pulse: (!) 103 90 77 (!) 105  Resp: 20 18 19 20   Temp: 98 F (36.7 C) 98.4 F (36.9 C) 98.4 F (36.9 C) 98 F (36.7 C)  TempSrc: Oral   Oral  SpO2: 96% 93% 90% 93%  Weight:      Height:        General: Pt is alert, awake, not in acute distress Cardiovascular: RRR, S1/S2 +, no rubs, no gallops Respiratory: CTA bilaterally, no wheezing, no rhonchi, on 3 L nasal cannula oxygen Abdominal: Soft, NT, ND, bowel sounds + Extremities: no edema, no cyanosis    The results of significant diagnostics from this hospitalization (including imaging, microbiology, ancillary and laboratory) are listed below for reference.     Microbiology: Recent Results (from the past 240 hour(s))  Blood Culture (routine x 2)     Status: None (Preliminary result)   Collection Time: 10/12/21 12:04 AM   Specimen: BLOOD  Result Value Ref Range Status   Specimen Description BLOOD SITE NOT SPECIFIED  Final   Special Requests   Final    BOTTLES DRAWN AEROBIC AND ANAEROBIC Blood Culture results may not be optimal due to an excessive volume of blood received in culture bottles   Culture   Final    NO GROWTH 2 DAYS Performed at [facility], [place] [address], [place], [place]  [address]    Report Status PENDING  Incomplete  Resp Panel by RT-PCR (Flu A&B, Covid)     Status: None   Collection Time: 10/12/21 11:58 PM   Specimen: Nasopharyngeal(NP) swabs in vial transport medium  Result Value Ref Range Status   SARS Coronavirus 2 by RT PCR NEGATIVE NEGATIVE Final    Comment: (NOTE) SARS-CoV-2 target nucleic acids are NOT DETECTED.  The SARS-CoV-2 RNA is generally detectable  in upper respiratory specimens during the acute phase of infection. The lowest concentration of SARS-CoV-2 viral copies this assay can detect is 138 copies/mL. A negative result does not preclude SARS-Cov-2 infection and should not be used as the sole basis for treatment or other patient management decisions. A negative result may occur with  improper specimen collection/handling, submission of specimen other than nasopharyngeal swab, presence of viral mutation(s) within the areas targeted by this assay, and inadequate number of viral copies(<138 copies/mL). A negative result must be combined with clinical observations, patient history, and epidemiological information. The expected result is Negative.  Fact Sheet for Patients:  EntrepreneurPulse.com.au  Fact Sheet for Healthcare Providers:  IncredibleEmployment.be  This test is no t yet approved or cleared by the [place] FDA and  has been authorized for detection and/or diagnosis of SARS-CoV-2 by FDA under an Emergency Use Authorization (EUA). This EUA will remain  in effect (meaning this test can be used) for the duration of the COVID-19 declaration under Section 564(b)(1) of the Act, 21 U.S.C.section 360bbb-3(b)(1), unless the authorization is terminated  or revoked sooner.       Influenza A by PCR NEGATIVE NEGATIVE Final   Influenza B by PCR NEGATIVE NEGATIVE Final    Comment: (NOTE) The Xpert Xpress SARS-CoV-2/FLU/RSV plus assay is intended as an aid in the diagnosis of influenza from  Nasopharyngeal swab specimens and should not be used as a sole basis for treatment. Nasal washings and aspirates are unacceptable for Xpert Xpress SARS-CoV-2/FLU/RSV testing.  Fact Sheet for Patients: EntrepreneurPulse.com.au  Fact Sheet for Healthcare Providers: IncredibleEmployment.be  This test is not yet approved or cleared by the [place] FDA and has been authorized for detection and/or diagnosis of SARS-CoV-2 by FDA under an Emergency Use Authorization (EUA). This EUA will remain in effect (meaning this test can be used) for the duration of the COVID-19 declaration under Section 564(b)(1) of the Act, 21 U.S.C. section 360bbb-3(b)(1), unless the authorization is terminated or revoked.  Performed at [facility], [place] [address], [place], [place] [address]   Urine Culture     Status: Abnormal (Preliminary result)   Collection Time: 10/12/21 11:59 PM   Specimen: In/Out Cath Urine  Result Value Ref Range Status   Specimen Description IN/OUT CATH URINE  Final   Special Requests NONE  Final   Culture (A)  Final    >=100,000 COLONIES/mL ESCHERICHIA COLI 30,000 COLONIES/mL KLEBSIELLA PNEUMONIAE CULTURE REINCUBATED FOR BETTER GROWTH SUSCEPTIBILITIES TO FOLLOW Performed at [facility], [place] [address], [place], [place] [address]    Report Status PENDING  Incomplete   Organism ID, Bacteria ESCHERICHIA COLI (A)  Final      Susceptibility   Escherichia coli - MIC*    AMPICILLIN >=32 RESISTANT Resistant     CEFAZOLIN <=4 SENSITIVE Sensitive     CEFEPIME <=0.12 SENSITIVE Sensitive     CEFTRIAXONE <=0.25 SENSITIVE Sensitive     CIPROFLOXACIN <=0.25 SENSITIVE Sensitive     GENTAMICIN <=1 SENSITIVE Sensitive     IMIPENEM <=0.25 SENSITIVE Sensitive     NITROFURANTOIN <=16 SENSITIVE Sensitive     TRIMETH/SULFA <=20 SENSITIVE Sensitive     AMPICILLIN/SULBACTAM >=32 RESISTANT Resistant     PIP/TAZO <=4 SENSITIVE Sensitive     *  >=100,000 COLONIES/mL ESCHERICHIA COLI  Blood Culture (routine x 2)     Status: None (Preliminary result)   Collection Time: 10/13/21  1:42 AM   Specimen: BLOOD  Result Value Ref Range Status  Specimen Description BLOOD LEFT ARM  Final   Special Requests   Final    BOTTLES DRAWN AEROBIC AND ANAEROBIC Blood Culture adequate volume   Culture   Final    NO GROWTH 2 DAYS Performed at [facility], [address], [place], [place] [address]    Report Status PENDING  Incomplete     Labs: BNP (last 3 results) No results for input(s): BNP in the last 8760 hours. Basic Metabolic Panel: Recent Labs  Lab 10/12/21 2310 10/13/21 0024 10/13/21 0029 10/14/21 0321  NA 140 141 141 138  K 4.0 4.0 4.1 3.8  CL 101 98  --  104  CO2 30  --   --  25  GLUCOSE 182* 173*  --  129*  BUN 25* 24*  --  21  CREATININE 1.31* 1.20*  --  0.98  CALCIUM 8.6*  --   --  8.4*   Liver Function Tests: Recent Labs  Lab 10/12/21 2310  AST 28  ALT 26  ALKPHOS 67  BILITOT 0.7  PROT 6.4*  ALBUMIN 3.1*   No results for input(s): LIPASE, AMYLASE in the last 168 hours. Recent Labs  Lab 10/13/21 0457  AMMONIA 17   CBC: Recent Labs  Lab 10/12/21 2310 10/13/21 0024 10/13/21 0029 10/13/21 0457 10/13/21 1546 10/14/21 0321 10/15/21 0324  WBC 19.7*  --   --  19.9* 22.4* 15.8* 13.8*  NEUTROABS 16.9*  --   --   --   --   --   --   HGB 12.3   < > 13.6 9.9* 12.9 11.8* 11.8*  HCT 39.2   < > 40.0 32.2* 40.9 37.2 37.9  MCV 97.3  --   --  98.2 97.8 96.6 96.2  PLT 154  --   --  124* 131* 127* 143*   < > = values in this interval not displayed.   Cardiac Enzymes: No results for input(s): CKTOTAL, CKMB, CKMBINDEX, TROPONINI in the last 168 hours. BNP: Invalid input(s): POCBNP CBG: Recent Labs  Lab 10/14/21 0759 10/14/21 1133 10/14/21 1652 10/14/21 2054 10/15/21 0716  GLUCAP 123* 137* 84 114* 107*   D-Dimer No results for input(s): DDIMER in the last 72 hours. Hgb A1c Recent Labs     10/13/21 0459  HGBA1C 5.8*   Lipid Profile No results for input(s): CHOL, HDL, LDLCALC, TRIG, CHOLHDL, LDLDIRECT in the last 72 hours. Thyroid function studies Recent Labs    10/13/21 0459  TSH 3.837   Anemia work up Recent Labs    10/13/21 0457  VITAMINB12 277   Urinalysis    Component Value Date/Time   COLORURINE YELLOW 10/12/2021 2313   APPEARANCEUR CLOUDY (A) 10/12/2021 2313   LABSPEC 1.015 10/12/2021 2313   PHURINE 6.0 10/12/2021 2313   GLUCOSEU NEGATIVE 10/12/2021 2313   GLUCOSEU NEGATIVE 06/12/2014 1410   HGBUR MODERATE (A) 10/12/2021 2313   BILIRUBINUR NEGATIVE 10/12/2021 2313   BILIRUBINUR Neg 06/19/2016 1101   KETONESUR NEGATIVE 10/12/2021 2313   PROTEINUR NEGATIVE 10/12/2021 2313   UROBILINOGEN >=8.0 06/19/2016 1101   UROBILINOGEN 0.2 05/31/2015 1835   NITRITE POSITIVE (A) 10/12/2021 2313   LEUKOCYTESUR LARGE (A) 10/12/2021 2313   Sepsis Labs Invalid input(s): PROCALCITONIN,  WBC,  LACTICIDVEN Microbiology Recent Results (from the past 240 hour(s))  Blood Culture (routine x 2)     Status: None (Preliminary result)   Collection Time: 10/12/21 12:04 AM   Specimen: BLOOD  Result Value Ref Range Status   Specimen Description BLOOD SITE  NOT SPECIFIED  Final   Special Requests   Final    BOTTLES DRAWN AEROBIC AND ANAEROBIC Blood Culture results may not be optimal due to an excessive volume of blood received in culture bottles   Culture   Final    NO GROWTH 2 DAYS Performed at [facility] [address] [place], [place] [address]    Report Status PENDING  Incomplete  Resp Panel by RT-PCR (Flu A&B, Covid)     Status: None   Collection Time: 10/12/21 11:58 PM   Specimen: Nasopharyngeal(NP) swabs in vial transport medium  Result Value Ref Range Status   SARS Coronavirus 2 by RT PCR NEGATIVE NEGATIVE Final    Comment: (NOTE) SARS-CoV-2 target nucleic acids are NOT DETECTED.  The SARS-CoV-2 RNA is generally detectable in upper  respiratory specimens during the acute phase of infection. The lowest concentration of SARS-CoV-2 viral copies this assay can detect is 138 copies/mL. A negative result does not preclude SARS-Cov-2 infection and should not be used as the sole basis for treatment or other patient management decisions. A negative result may occur with  improper specimen collection/handling, submission of specimen other than nasopharyngeal swab, presence of viral mutation(s) within the areas targeted by this assay, and inadequate number of viral copies(<138 copies/mL). A negative result must be combined with clinical observations, patient history, and epidemiological information. The expected result is Negative.  Fact Sheet for Patients:  EntrepreneurPulse.com.au  Fact Sheet for Healthcare Providers:  IncredibleEmployment.be  This test is no t yet approved or cleared by the [place] FDA and  has been authorized for detection and/or diagnosis of SARS-CoV-2 by FDA under an Emergency Use Authorization (EUA). This EUA will remain  in effect (meaning this test can be used) for the duration of the COVID-19 declaration under Section 564(b)(1) of the Act, 21 U.S.C.section 360bbb-3(b)(1), unless the authorization is terminated  or revoked sooner.       Influenza A by PCR NEGATIVE NEGATIVE Final   Influenza B by PCR NEGATIVE NEGATIVE Final    Comment: (NOTE) The Xpert Xpress SARS-CoV-2/FLU/RSV plus assay is intended as an aid in the diagnosis of influenza from Nasopharyngeal swab specimens and should not be used as a sole basis for treatment. Nasal washings and aspirates are unacceptable for Xpert Xpress SARS-CoV-2/FLU/RSV testing.  Fact Sheet for Patients: EntrepreneurPulse.com.au  Fact Sheet for Healthcare Providers: IncredibleEmployment.be  This test is not yet approved or cleared by the [place] FDA and has been  authorized for detection and/or diagnosis of SARS-CoV-2 by FDA under an Emergency Use Authorization (EUA). This EUA will remain in effect (meaning this test can be used) for the duration of the COVID-19 declaration under Section 564(b)(1) of the Act, 21 U.S.C. section 360bbb-3(b)(1), unless the authorization is terminated or revoked.  Performed at [facility], [place] [address], [place], [place] [address]   Urine Culture     Status: Abnormal (Preliminary result)   Collection Time: 10/12/21 11:59 PM   Specimen: In/Out Cath Urine  Result Value Ref Range Status   Specimen Description IN/OUT CATH URINE  Final   Special Requests NONE  Final   Culture (A)  Final    >=100,000 COLONIES/mL ESCHERICHIA COLI 30,000 COLONIES/mL KLEBSIELLA PNEUMONIAE CULTURE REINCUBATED FOR BETTER GROWTH SUSCEPTIBILITIES TO FOLLOW Performed at [facility], [place] [address] [place], [place] [address]    Report Status PENDING  Incomplete   Organism ID, Bacteria ESCHERICHIA COLI (A)  Final  Susceptibility   Escherichia coli - MIC*    AMPICILLIN >=32 RESISTANT Resistant     CEFAZOLIN <=4 SENSITIVE Sensitive     CEFEPIME <=0.12 SENSITIVE Sensitive     CEFTRIAXONE <=0.25 SENSITIVE Sensitive     CIPROFLOXACIN <=0.25 SENSITIVE Sensitive     GENTAMICIN <=1 SENSITIVE Sensitive     IMIPENEM <=0.25 SENSITIVE Sensitive     NITROFURANTOIN <=16 SENSITIVE Sensitive     TRIMETH/SULFA <=20 SENSITIVE Sensitive     AMPICILLIN/SULBACTAM >=32 RESISTANT Resistant     PIP/TAZO <=4 SENSITIVE Sensitive     * >=100,000 COLONIES/mL ESCHERICHIA COLI  Blood Culture (routine x 2)     Status: None (Preliminary result)   Collection Time: 10/13/21  1:42 AM   Specimen: BLOOD  Result Value Ref Range Status   Specimen Description BLOOD LEFT ARM  Final   Special Requests   Final    BOTTLES DRAWN AEROBIC AND ANAEROBIC Blood Culture adequate volume   Culture   Final    NO GROWTH 2 DAYS Performed at [facility],  [address] [place], [place] [address]    Report Status PENDING  Incomplete     Time coordinating discharge: 35 minutes  SIGNED:   Rodena Goldmann, DO [facility] 10/15/2021, 10:36 AM  If 7PM-7AM, please contact night-coverage [email or web address]

## 2021-10-15 NOTE — TOC Transition Note (Signed)
Transition of Care [facility]) - CM/SW Discharge Note   Patient Details  Name: Dawn Morrow MRN: [id number] Date of Birth: 07-06-1936  Transition of [facility]) CM/SW Contact:  Milinda Antis, [place] Phone Number: 10/15/2021, 2:24 PM   Clinical Narrative:    Patient will DC to:  [facility] Anticipated DC date: 10/15/2021 Family notified: Yes Transport [id number]   Per MD patient ready for DC to LT- SNF. RN to call report prior to discharge [phone] room 312. RN, patient, patient's family, and facility notified of DC. Discharge Summary and FL2 sent to facility. DC packet on chart. Ambulance transport will be requested for patient.   CSW will sign off for now as social work intervention is no longer needed. Please consult [place] again if new needs arise.     Final next level of care: Skilled Nursing Facility Barriers to Discharge: No Barriers Identified   Patient Goals and CMS Choice   CMS Medicare.gov Compare Post Acute Care list provided to:: Patient Represenative (must comment)    Discharge Placement              Patient chooses bed at:  ([facility]) Patient to be transferred to facility by: [place] Name of family member notified: Merrilee Jansky (Niece)   [phone] Patient and family notified of of transfer: 10/15/21  Discharge Plan and Services                                     Social Determinants of Health (SDOH) Interventions     Readmission Risk Interventions Readmission Risk Prevention Plan 04/22/2021 10/03/2019  Transportation Screening Complete Complete  PCP or Specialist Appt within 3-5 Days - Complete  HRI or [place] - Not Complete  HRI or Home Care Consult comments - Patient at her baseline  Social Work Consult for [place] Planning/Counseling - [place] - Not Applicable  Medication Review Press photographer) Complete Referral to Pharmacy  PCP or Specialist appointment within 3-5 days of discharge  Complete -  [place] or Home Care Consult Complete -  SW Recovery Care/Counseling Consult Complete -  Palliative Care Screening Not Applicable -  Skilled Nursing Facility Complete -  Some recent data might be hidden

## 2021-10-15 NOTE — Progress Notes (Signed)
DISCHARGE NOTE [facility] to be discharged [facility]  per MD order. Patient verbalized understanding.  Skin clean, dry and intact without evidence of skin break down, no evidence of skin tears noted. IV catheter discontinued intact. Site without signs and symptoms of complications. Dressing and pressure applied. Pt denies pain at the site currently. No complaints noted.  Patient free of lines, drains, and wounds.   Discharge packet assembled. An After Visit Summary (AVS) was printed and given to the EMS personnel. Patient escorted via stretcher and discharged to [facility] via ambulance. Report called to accepting facility; all questions and concerns addressed.   Talli Kimmer S Pegeen Stiger, RN _______________________________________________________________________

## 2021-10-16 LAB — URINE CULTURE: Culture: >=100000 — AB

## 2021-10-18 LAB — CULTURE, BLOOD (ROUTINE X 2)
Culture: NO GROWTH
Culture: NO GROWTH
Special Requests: ADEQUATE

## 2021-11-05 DEATH — deceased

## 2021-11-28 ENCOUNTER — Other Ambulatory Visit (HOSPITAL_COMMUNITY): Payer: Self-pay

## 2022-08-10 IMAGING — CT CT HEAD W/O CM
4 series · 16 of 47 positions shown, 18 images · non-contrast
Comparison: 07/16/2020

CLINICAL DATA: Mental status change of unknown cause. History of
hypertension and diabetes.

EXAM:
CT HEAD WITHOUT CONTRAST
TECHNIQUE: Contiguous axial images were obtained from the base of the skull
through the vertex without intravenous contrast.

[Series 3: head without · axial · non-contrast · 0.43mm/px · z∈[-242,-102]mm · 6 of 40 slices shown, 8 images]
[im 6/40  brain]
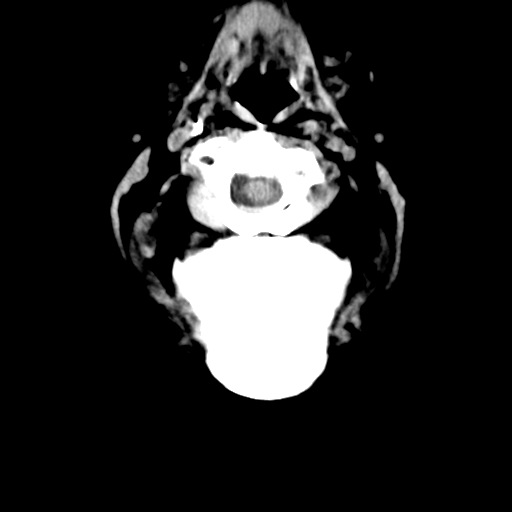
[im 6/40  bone]
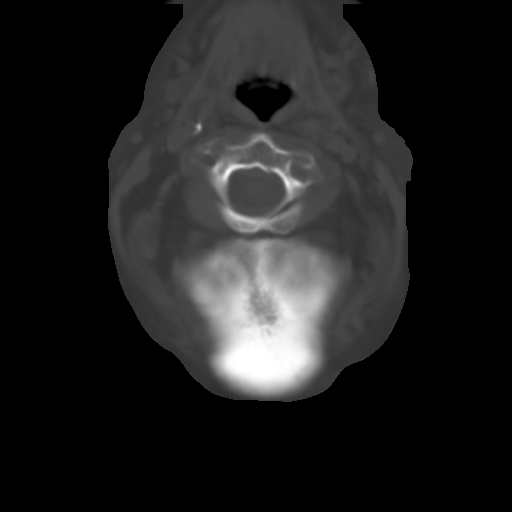
[im 12/40  brain]
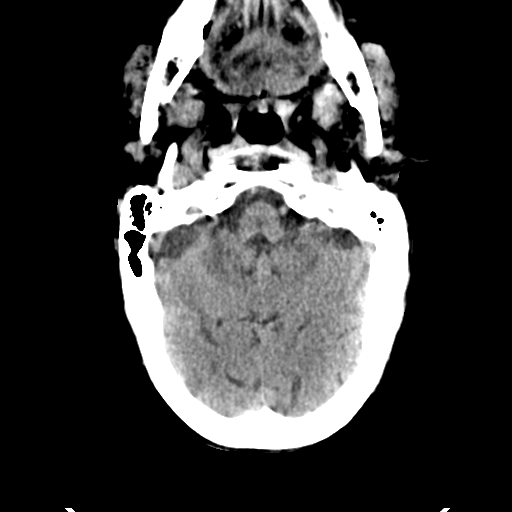
[im 17/40  brain]
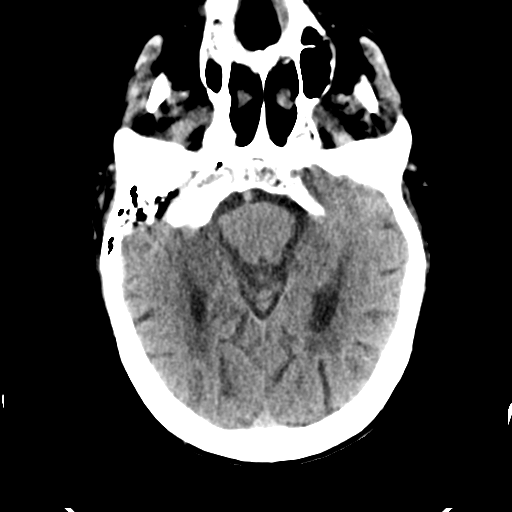
[im 23/40  brain]
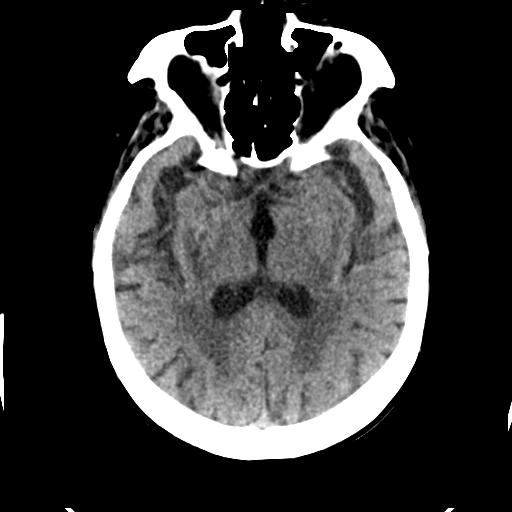
[im 28/40  brain]
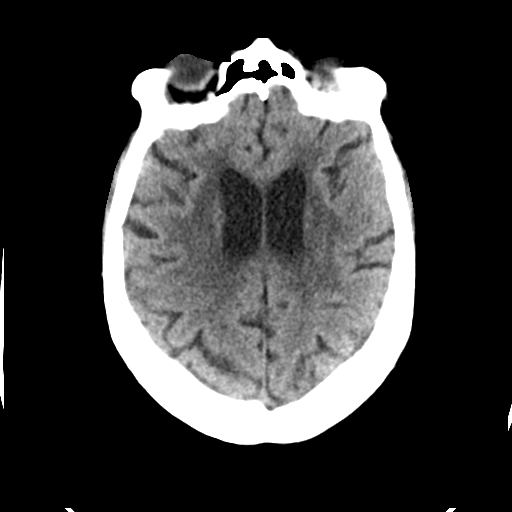
[im 28/40  bone]
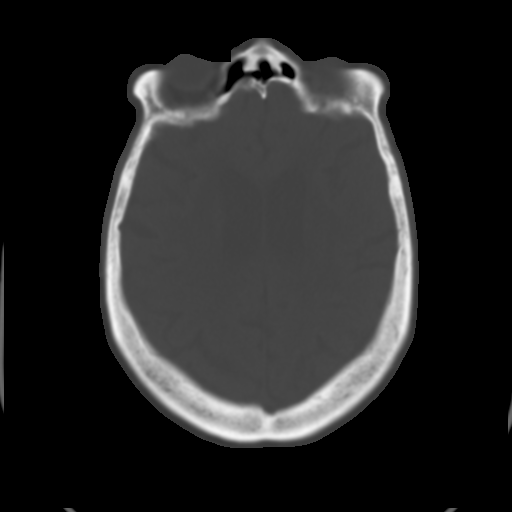
[im 34/40  brain]
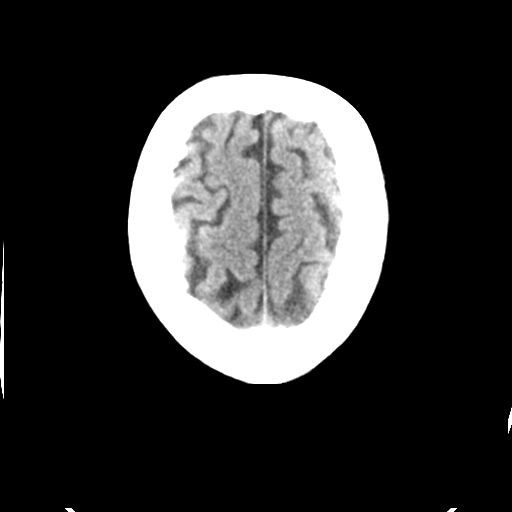

[Series 4: head bone · axial · 0.43mm/px · z∈[-250,-182]mm · 4 of 103 slices shown]
[im 10/103  bone]
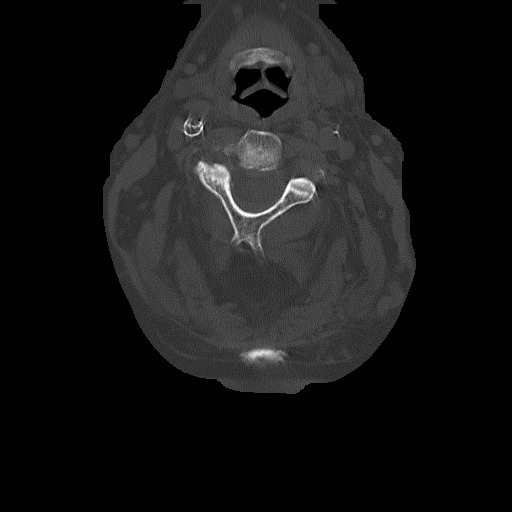
[im 20/103  bone]
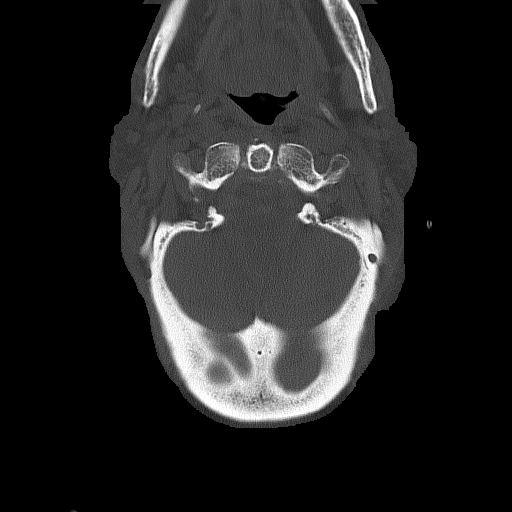
[im 35/103  bone]
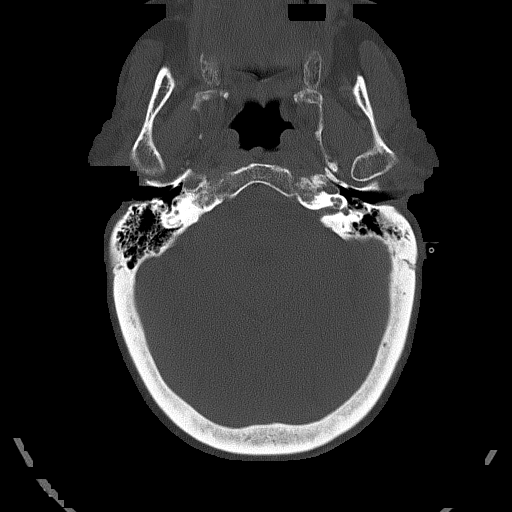
[im 44/103  bone]
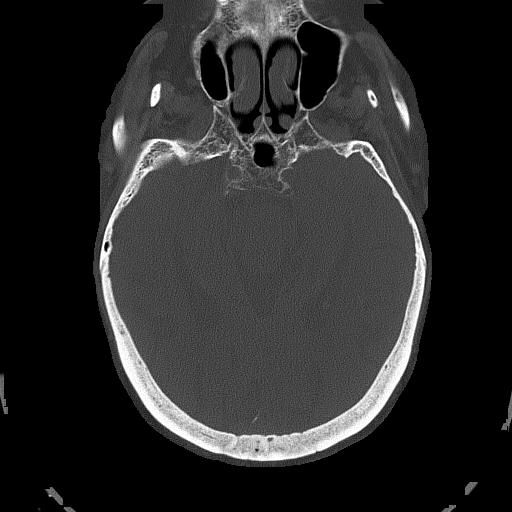

[Series 5: head without cor · coronal · non-contrast · 0.33mm/px · 3 of 76 slices shown]
[im 26/76  brain]
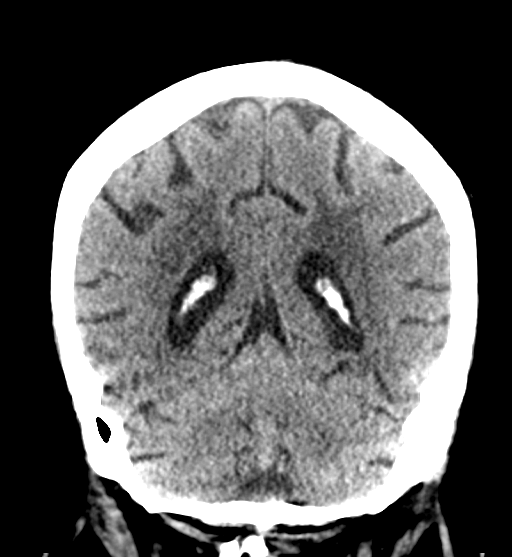
[im 34/76  brain]
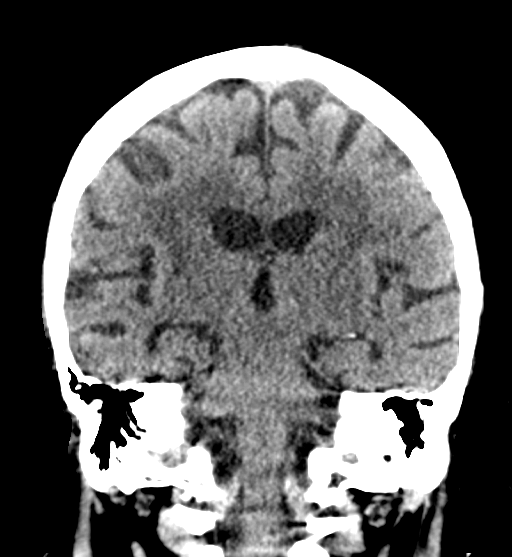
[im 42/76  brain]
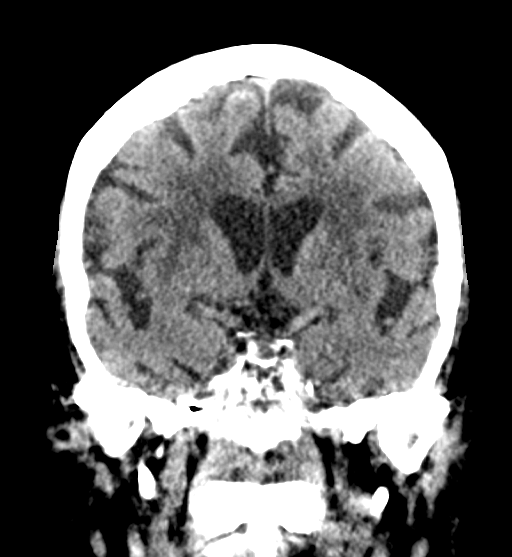

[Series 6: head without sag · sagittal · non-contrast · 0.38mm/px · 3 of 55 slices shown]
[im 19/55  brain]
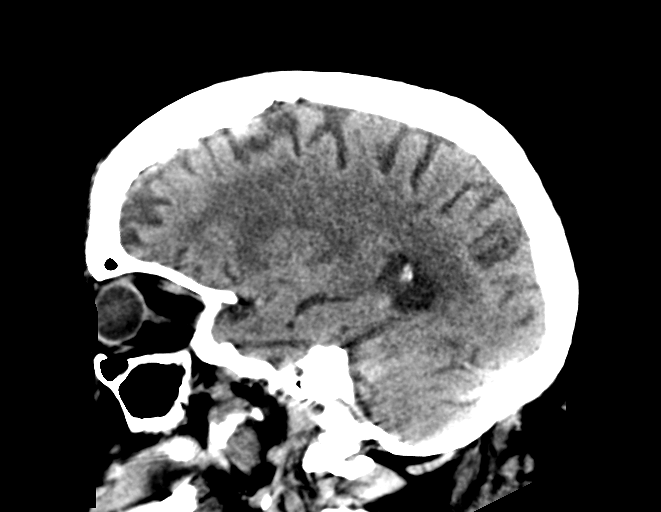
[im 28/55  brain]
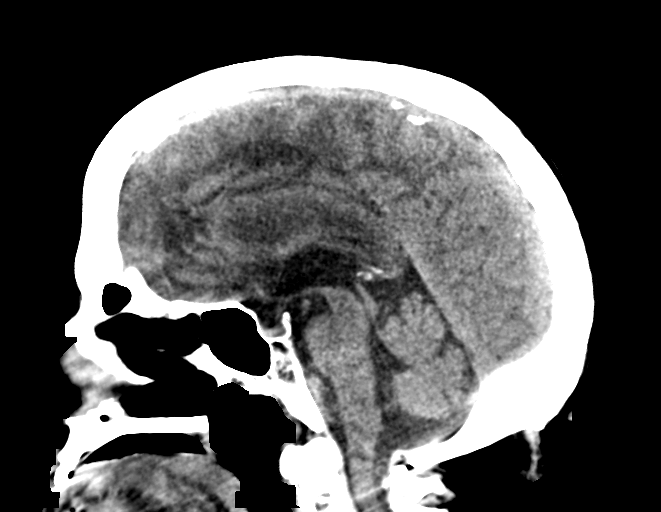
[im 37/55  brain]
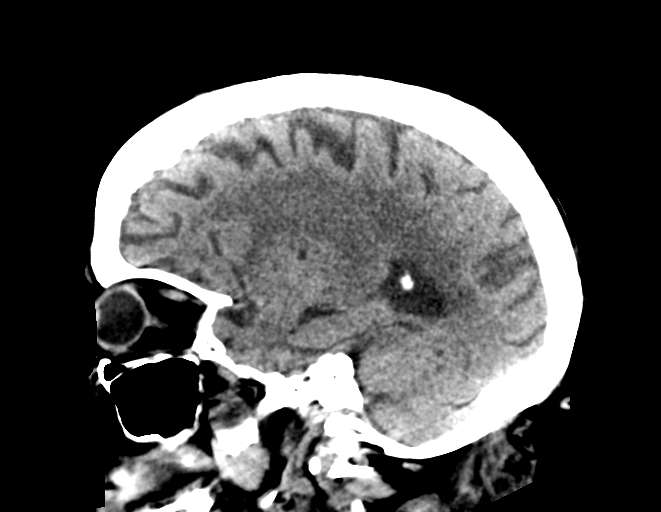

[16 of 47 positions shown; findings below may reference images not displayed]

FINDINGS: Brain: Mild cerebral atrophy. Mild ventricular dilatation consistent
with central atrophy. Prominent low-attenuation changes throughout
the deep white matter consistent with small vessel ischemia. No
mass-effect or midline shift. No abnormal extra-axial fluid
collections. The gray-white matter junctions are distinct. Basal
cisterns are not effaced. No acute intracranial hemorrhage.

Vascular: Moderate intracranial arterial vascular calcifications.

Skull: The calvarium appears intact. No acute depressed skull
fractures.

Sinuses/Orbits: Paranasal sinuses and mastoid air cells are clear.

Other: Dense subcutaneous soft tissue nodule over the left parietal
convexity likely representing a pilonidal cyst.
IMPRESSION: 1. No acute intracranial abnormalities.
2. Chronic atrophy and small vessel ischemia.
